# Patient Record
Sex: Female | Born: 1935 | Race: White | Hispanic: No | Smoking: Never smoker
Health system: Southern US, Community
[De-identification: ages and names within clinical notes are randomized; demographics above are authoritative.]

## PROBLEM LIST (undated history)

## (undated) ENCOUNTER — Emergency Department (HOSPITAL_COMMUNITY): Payer: Medicare Other | Source: Home / Self Care

## (undated) DIAGNOSIS — K439 Ventral hernia without obstruction or gangrene: Secondary | ICD-10-CM

## (undated) DIAGNOSIS — J449 Chronic obstructive pulmonary disease, unspecified: Secondary | ICD-10-CM

## (undated) DIAGNOSIS — E119 Type 2 diabetes mellitus without complications: Secondary | ICD-10-CM

## (undated) DIAGNOSIS — Z8051 Family history of malignant neoplasm of kidney: Secondary | ICD-10-CM

## (undated) DIAGNOSIS — T8859XA Other complications of anesthesia, initial encounter: Secondary | ICD-10-CM

## (undated) DIAGNOSIS — I509 Heart failure, unspecified: Secondary | ICD-10-CM

## (undated) DIAGNOSIS — C801 Malignant (primary) neoplasm, unspecified: Secondary | ICD-10-CM

## (undated) DIAGNOSIS — Z8052 Family history of malignant neoplasm of bladder: Secondary | ICD-10-CM

## (undated) DIAGNOSIS — I10 Essential (primary) hypertension: Secondary | ICD-10-CM

## (undated) DIAGNOSIS — Z1509 Genetic susceptibility to other malignant neoplasm: Secondary | ICD-10-CM

## (undated) DIAGNOSIS — T7840XA Allergy, unspecified, initial encounter: Secondary | ICD-10-CM

## (undated) DIAGNOSIS — R112 Nausea with vomiting, unspecified: Secondary | ICD-10-CM

## (undated) DIAGNOSIS — Z87442 Personal history of urinary calculi: Secondary | ICD-10-CM

## (undated) DIAGNOSIS — E079 Disorder of thyroid, unspecified: Secondary | ICD-10-CM

## (undated) DIAGNOSIS — T4145XA Adverse effect of unspecified anesthetic, initial encounter: Secondary | ICD-10-CM

## (undated) DIAGNOSIS — M1990 Unspecified osteoarthritis, unspecified site: Secondary | ICD-10-CM

## (undated) DIAGNOSIS — K5730 Diverticulosis of large intestine without perforation or abscess without bleeding: Secondary | ICD-10-CM

## (undated) DIAGNOSIS — Z8719 Personal history of other diseases of the digestive system: Secondary | ICD-10-CM

## (undated) DIAGNOSIS — K219 Gastro-esophageal reflux disease without esophagitis: Secondary | ICD-10-CM

## (undated) DIAGNOSIS — F0280 Dementia in other diseases classified elsewhere without behavioral disturbance: Secondary | ICD-10-CM

## (undated) DIAGNOSIS — I499 Cardiac arrhythmia, unspecified: Secondary | ICD-10-CM

## (undated) DIAGNOSIS — K760 Fatty (change of) liver, not elsewhere classified: Secondary | ICD-10-CM

## (undated) DIAGNOSIS — I4891 Unspecified atrial fibrillation: Secondary | ICD-10-CM

## (undated) DIAGNOSIS — I517 Cardiomegaly: Secondary | ICD-10-CM

## (undated) DIAGNOSIS — E039 Hypothyroidism, unspecified: Secondary | ICD-10-CM

## (undated) DIAGNOSIS — J40 Bronchitis, not specified as acute or chronic: Secondary | ICD-10-CM

## (undated) DIAGNOSIS — R0600 Dyspnea, unspecified: Secondary | ICD-10-CM

## (undated) DIAGNOSIS — N390 Urinary tract infection, site not specified: Secondary | ICD-10-CM

## (undated) DIAGNOSIS — E785 Hyperlipidemia, unspecified: Secondary | ICD-10-CM

## (undated) DIAGNOSIS — Z800 Family history of malignant neoplasm of digestive organs: Secondary | ICD-10-CM

## (undated) DIAGNOSIS — Z9889 Other specified postprocedural states: Secondary | ICD-10-CM

## (undated) HISTORY — PX: APPENDECTOMY: SHX54

## (undated) HISTORY — DX: Essential (primary) hypertension: I10

## (undated) HISTORY — PX: UPPER GI ENDOSCOPY: SHX6162

## (undated) HISTORY — PX: ABDOMINAL HYSTERECTOMY: SHX81

## (undated) HISTORY — DX: Family history of malignant neoplasm of bladder: Z80.52

## (undated) HISTORY — DX: Family history of malignant neoplasm of kidney: Z80.51

## (undated) HISTORY — DX: Family history of malignant neoplasm of digestive organs: Z80.0

## (undated) HISTORY — PX: KNEE SURGERY: SHX244

## (undated) HISTORY — DX: Disorder of thyroid, unspecified: E07.9

## (undated) HISTORY — PX: OTHER SURGICAL HISTORY: SHX169

## (undated) HISTORY — PX: CHOLECYSTECTOMY: SHX55

## (undated) HISTORY — DX: Allergy, unspecified, initial encounter: T78.40XA

## (undated) HISTORY — DX: Hyperlipidemia, unspecified: E78.5

---

## 1998-01-15 ENCOUNTER — Emergency Department (HOSPITAL_COMMUNITY): Admission: EM | Admit: 1998-01-15 | Discharge: 1998-01-15 | Payer: Self-pay | Admitting: Emergency Medicine

## 1999-08-31 ENCOUNTER — Encounter: Payer: Self-pay | Admitting: Emergency Medicine

## 1999-08-31 ENCOUNTER — Inpatient Hospital Stay (HOSPITAL_COMMUNITY): Admission: EM | Admit: 1999-08-31 | Discharge: 1999-09-04 | Payer: Self-pay | Admitting: Emergency Medicine

## 1999-09-01 ENCOUNTER — Encounter: Payer: Self-pay | Admitting: Internal Medicine

## 2000-06-18 ENCOUNTER — Encounter: Payer: Self-pay | Admitting: Internal Medicine

## 2000-06-18 ENCOUNTER — Encounter: Admission: RE | Admit: 2000-06-18 | Discharge: 2000-06-18 | Payer: Self-pay | Admitting: Internal Medicine

## 2001-01-28 ENCOUNTER — Encounter: Payer: Self-pay | Admitting: Emergency Medicine

## 2001-01-28 ENCOUNTER — Emergency Department (HOSPITAL_COMMUNITY): Admission: EM | Admit: 2001-01-28 | Discharge: 2001-01-28 | Payer: Self-pay | Admitting: Emergency Medicine

## 2001-01-29 ENCOUNTER — Emergency Department (HOSPITAL_COMMUNITY): Admission: EM | Admit: 2001-01-29 | Discharge: 2001-01-29 | Payer: Self-pay | Admitting: Emergency Medicine

## 2001-05-26 ENCOUNTER — Emergency Department (HOSPITAL_COMMUNITY): Admission: EM | Admit: 2001-05-26 | Discharge: 2001-05-26 | Payer: Self-pay | Admitting: Emergency Medicine

## 2001-05-26 ENCOUNTER — Encounter: Payer: Self-pay | Admitting: Emergency Medicine

## 2002-02-01 ENCOUNTER — Encounter: Admission: RE | Admit: 2002-02-01 | Discharge: 2002-02-01 | Payer: Self-pay | Admitting: Internal Medicine

## 2002-02-01 ENCOUNTER — Encounter: Payer: Self-pay | Admitting: Internal Medicine

## 2002-02-16 ENCOUNTER — Emergency Department (HOSPITAL_COMMUNITY): Admission: EM | Admit: 2002-02-16 | Discharge: 2002-02-17 | Payer: Self-pay | Admitting: Emergency Medicine

## 2002-02-16 ENCOUNTER — Encounter: Payer: Self-pay | Admitting: Emergency Medicine

## 2002-02-17 ENCOUNTER — Encounter: Payer: Self-pay | Admitting: Emergency Medicine

## 2003-03-24 ENCOUNTER — Encounter: Payer: Self-pay | Admitting: Internal Medicine

## 2003-03-24 ENCOUNTER — Encounter: Admission: RE | Admit: 2003-03-24 | Discharge: 2003-03-24 | Payer: Self-pay | Admitting: Internal Medicine

## 2003-04-13 ENCOUNTER — Encounter: Payer: Self-pay | Admitting: Orthopedic Surgery

## 2003-04-14 ENCOUNTER — Ambulatory Visit (HOSPITAL_COMMUNITY): Admission: RE | Admit: 2003-04-14 | Discharge: 2003-04-14 | Payer: Self-pay | Admitting: Orthopedic Surgery

## 2004-02-26 ENCOUNTER — Encounter: Payer: Self-pay | Admitting: Internal Medicine

## 2004-06-11 ENCOUNTER — Emergency Department (HOSPITAL_COMMUNITY): Admission: EM | Admit: 2004-06-11 | Discharge: 2004-06-11 | Payer: Self-pay | Admitting: Family Medicine

## 2004-06-18 ENCOUNTER — Emergency Department (HOSPITAL_COMMUNITY): Admission: EM | Admit: 2004-06-18 | Discharge: 2004-06-19 | Payer: Self-pay | Admitting: Emergency Medicine

## 2004-07-30 ENCOUNTER — Ambulatory Visit: Payer: Self-pay | Admitting: Internal Medicine

## 2004-10-23 ENCOUNTER — Ambulatory Visit: Payer: Self-pay | Admitting: Internal Medicine

## 2004-10-26 ENCOUNTER — Inpatient Hospital Stay (HOSPITAL_COMMUNITY): Admission: EM | Admit: 2004-10-26 | Discharge: 2004-10-27 | Payer: Self-pay | Admitting: Emergency Medicine

## 2004-10-26 ENCOUNTER — Ambulatory Visit: Payer: Self-pay | Admitting: Internal Medicine

## 2004-10-31 ENCOUNTER — Ambulatory Visit: Payer: Self-pay | Admitting: Internal Medicine

## 2005-01-02 ENCOUNTER — Ambulatory Visit (HOSPITAL_BASED_OUTPATIENT_CLINIC_OR_DEPARTMENT_OTHER): Admission: RE | Admit: 2005-01-02 | Discharge: 2005-01-02 | Payer: Self-pay | Admitting: Orthopedic Surgery

## 2005-01-02 ENCOUNTER — Ambulatory Visit (HOSPITAL_COMMUNITY): Admission: RE | Admit: 2005-01-02 | Discharge: 2005-01-02 | Payer: Self-pay | Admitting: Orthopedic Surgery

## 2005-01-02 ENCOUNTER — Observation Stay (HOSPITAL_COMMUNITY): Admission: AD | Admit: 2005-01-02 | Discharge: 2005-01-03 | Payer: Self-pay | Admitting: Orthopedic Surgery

## 2005-03-18 ENCOUNTER — Ambulatory Visit: Payer: Self-pay | Admitting: Internal Medicine

## 2005-06-06 ENCOUNTER — Emergency Department (HOSPITAL_COMMUNITY): Admission: EM | Admit: 2005-06-06 | Discharge: 2005-06-06 | Payer: Self-pay | Admitting: Emergency Medicine

## 2005-06-16 ENCOUNTER — Ambulatory Visit: Payer: Self-pay | Admitting: Internal Medicine

## 2005-08-11 ENCOUNTER — Emergency Department (HOSPITAL_COMMUNITY): Admission: EM | Admit: 2005-08-11 | Discharge: 2005-08-12 | Payer: Self-pay | Admitting: Emergency Medicine

## 2005-08-31 ENCOUNTER — Emergency Department (HOSPITAL_COMMUNITY): Admission: EM | Admit: 2005-08-31 | Discharge: 2005-08-31 | Payer: Self-pay | Admitting: Emergency Medicine

## 2006-02-04 ENCOUNTER — Emergency Department (HOSPITAL_COMMUNITY): Admission: EM | Admit: 2006-02-04 | Discharge: 2006-02-04 | Payer: Self-pay | Admitting: Emergency Medicine

## 2006-02-05 ENCOUNTER — Inpatient Hospital Stay (HOSPITAL_COMMUNITY): Admission: EM | Admit: 2006-02-05 | Discharge: 2006-02-09 | Payer: Self-pay | Admitting: Internal Medicine

## 2006-02-05 ENCOUNTER — Ambulatory Visit: Payer: Self-pay | Admitting: Internal Medicine

## 2006-02-08 ENCOUNTER — Encounter (INDEPENDENT_AMBULATORY_CARE_PROVIDER_SITE_OTHER): Payer: Self-pay | Admitting: Specialist

## 2006-02-09 ENCOUNTER — Encounter: Payer: Self-pay | Admitting: Internal Medicine

## 2006-02-11 ENCOUNTER — Ambulatory Visit: Payer: Self-pay | Admitting: Gastroenterology

## 2006-02-16 ENCOUNTER — Ambulatory Visit: Payer: Self-pay | Admitting: Internal Medicine

## 2006-05-12 ENCOUNTER — Ambulatory Visit: Payer: Self-pay | Admitting: Internal Medicine

## 2006-10-28 ENCOUNTER — Emergency Department (HOSPITAL_COMMUNITY): Admission: EM | Admit: 2006-10-28 | Discharge: 2006-10-28 | Payer: Self-pay | Admitting: Emergency Medicine

## 2006-11-17 ENCOUNTER — Ambulatory Visit: Payer: Self-pay | Admitting: Internal Medicine

## 2006-12-29 ENCOUNTER — Ambulatory Visit: Payer: Self-pay | Admitting: Internal Medicine

## 2007-03-09 ENCOUNTER — Encounter: Payer: Self-pay | Admitting: Internal Medicine

## 2007-03-09 DIAGNOSIS — E785 Hyperlipidemia, unspecified: Secondary | ICD-10-CM | POA: Insufficient documentation

## 2007-03-09 DIAGNOSIS — K219 Gastro-esophageal reflux disease without esophagitis: Secondary | ICD-10-CM | POA: Insufficient documentation

## 2007-03-09 DIAGNOSIS — J45909 Unspecified asthma, uncomplicated: Secondary | ICD-10-CM

## 2007-03-09 DIAGNOSIS — I10 Essential (primary) hypertension: Secondary | ICD-10-CM

## 2007-03-09 DIAGNOSIS — E039 Hypothyroidism, unspecified: Secondary | ICD-10-CM

## 2007-03-09 DIAGNOSIS — J309 Allergic rhinitis, unspecified: Secondary | ICD-10-CM | POA: Insufficient documentation

## 2007-03-09 DIAGNOSIS — R32 Unspecified urinary incontinence: Secondary | ICD-10-CM | POA: Insufficient documentation

## 2007-03-29 ENCOUNTER — Ambulatory Visit: Payer: Self-pay | Admitting: Internal Medicine

## 2007-07-19 ENCOUNTER — Ambulatory Visit: Payer: Self-pay | Admitting: Internal Medicine

## 2007-08-27 ENCOUNTER — Telehealth: Payer: Self-pay | Admitting: Internal Medicine

## 2007-12-30 ENCOUNTER — Emergency Department (HOSPITAL_COMMUNITY): Admission: EM | Admit: 2007-12-30 | Discharge: 2007-12-31 | Payer: Self-pay | Admitting: Emergency Medicine

## 2008-01-02 ENCOUNTER — Telehealth: Payer: Self-pay | Admitting: Internal Medicine

## 2008-01-28 ENCOUNTER — Ambulatory Visit: Payer: Self-pay | Admitting: Internal Medicine

## 2008-01-28 DIAGNOSIS — E739 Lactose intolerance, unspecified: Secondary | ICD-10-CM

## 2008-01-28 LAB — CONVERTED CEMR LAB
AST: 17 units/L (ref 0–37)
Cholesterol: 138 mg/dL (ref 0–200)
HDL: 31.5 mg/dL — ABNORMAL LOW (ref >39.0)
Hgb A1c MFr Bld: 6.2 % — ABNORMAL HIGH (ref 4.6–6.0)
LDL Cholesterol: 86 mg/dL (ref 0–99)
Total CHOL/HDL Ratio: 4.4
Triglycerides: 102 mg/dL (ref 0–149)
VLDL: 20 mg/dL (ref 0–40)

## 2008-01-31 ENCOUNTER — Telehealth (INDEPENDENT_AMBULATORY_CARE_PROVIDER_SITE_OTHER): Payer: Self-pay | Admitting: *Deleted

## 2008-02-23 ENCOUNTER — Encounter: Payer: Self-pay | Admitting: Internal Medicine

## 2008-06-06 ENCOUNTER — Ambulatory Visit: Payer: Self-pay | Admitting: Internal Medicine

## 2008-06-06 DIAGNOSIS — J111 Influenza due to unidentified influenza virus with other respiratory manifestations: Secondary | ICD-10-CM | POA: Insufficient documentation

## 2008-09-25 ENCOUNTER — Emergency Department (HOSPITAL_COMMUNITY): Admission: EM | Admit: 2008-09-25 | Discharge: 2008-09-25 | Payer: Self-pay | Admitting: Family Medicine

## 2008-11-07 ENCOUNTER — Ambulatory Visit: Payer: Self-pay | Admitting: Internal Medicine

## 2008-11-07 DIAGNOSIS — R109 Unspecified abdominal pain: Secondary | ICD-10-CM | POA: Insufficient documentation

## 2008-11-07 LAB — CONVERTED CEMR LAB: Blood Glucose, Fingerstick: 84

## 2008-11-25 ENCOUNTER — Emergency Department (HOSPITAL_COMMUNITY): Admission: EM | Admit: 2008-11-25 | Discharge: 2008-11-25 | Payer: Self-pay | Admitting: Emergency Medicine

## 2008-12-26 ENCOUNTER — Encounter (INDEPENDENT_AMBULATORY_CARE_PROVIDER_SITE_OTHER): Payer: Self-pay | Admitting: *Deleted

## 2009-02-05 ENCOUNTER — Ambulatory Visit: Payer: Self-pay | Admitting: Gastroenterology

## 2009-02-12 ENCOUNTER — Ambulatory Visit: Payer: Self-pay | Admitting: Internal Medicine

## 2009-02-12 DIAGNOSIS — J069 Acute upper respiratory infection, unspecified: Secondary | ICD-10-CM

## 2009-02-14 ENCOUNTER — Emergency Department (HOSPITAL_COMMUNITY): Admission: EM | Admit: 2009-02-14 | Discharge: 2009-02-14 | Payer: Self-pay | Admitting: Emergency Medicine

## 2009-02-15 ENCOUNTER — Ambulatory Visit: Payer: Self-pay | Admitting: Internal Medicine

## 2009-02-15 ENCOUNTER — Observation Stay (HOSPITAL_COMMUNITY): Admission: EM | Admit: 2009-02-15 | Discharge: 2009-02-18 | Payer: Self-pay | Admitting: Emergency Medicine

## 2009-02-26 ENCOUNTER — Ambulatory Visit: Payer: Self-pay | Admitting: Internal Medicine

## 2009-04-13 ENCOUNTER — Telehealth (INDEPENDENT_AMBULATORY_CARE_PROVIDER_SITE_OTHER): Payer: Self-pay | Admitting: *Deleted

## 2009-05-17 ENCOUNTER — Ambulatory Visit: Payer: Self-pay | Admitting: Internal Medicine

## 2009-06-29 ENCOUNTER — Encounter (INDEPENDENT_AMBULATORY_CARE_PROVIDER_SITE_OTHER): Payer: Self-pay | Admitting: *Deleted

## 2009-07-05 ENCOUNTER — Ambulatory Visit: Payer: Self-pay | Admitting: Gastroenterology

## 2009-07-16 ENCOUNTER — Ambulatory Visit: Payer: Self-pay | Admitting: Gastroenterology

## 2009-07-16 HISTORY — PX: COLONOSCOPY: SHX174

## 2009-10-19 ENCOUNTER — Emergency Department (HOSPITAL_COMMUNITY): Admission: EM | Admit: 2009-10-19 | Discharge: 2009-10-19 | Payer: Self-pay | Admitting: Family Medicine

## 2010-01-13 ENCOUNTER — Emergency Department (HOSPITAL_COMMUNITY): Admission: EM | Admit: 2010-01-13 | Discharge: 2010-01-13 | Payer: Self-pay | Admitting: Family Medicine

## 2010-01-13 ENCOUNTER — Emergency Department (HOSPITAL_COMMUNITY): Admission: EM | Admit: 2010-01-13 | Discharge: 2010-01-13 | Payer: Self-pay | Admitting: Emergency Medicine

## 2010-01-14 ENCOUNTER — Telehealth: Payer: Self-pay | Admitting: Internal Medicine

## 2010-01-16 ENCOUNTER — Encounter (INDEPENDENT_AMBULATORY_CARE_PROVIDER_SITE_OTHER): Payer: Self-pay | Admitting: General Surgery

## 2010-01-16 ENCOUNTER — Ambulatory Visit (HOSPITAL_COMMUNITY): Admission: AD | Admit: 2010-01-16 | Discharge: 2010-01-17 | Payer: Self-pay | Admitting: General Surgery

## 2010-02-28 ENCOUNTER — Ambulatory Visit: Payer: Self-pay | Admitting: Internal Medicine

## 2010-02-28 LAB — CONVERTED CEMR LAB
ALT: 25 units/L (ref 0–35)
AST: 22 units/L (ref 0–37)
Albumin: 4 g/dL (ref 3.5–5.2)
Alkaline Phosphatase: 52 units/L (ref 39–117)
BUN: 16 mg/dL (ref 6–23)
Basophils Absolute: 0 10*3/uL (ref 0.0–0.1)
Basophils Relative: 0.6 % (ref 0.0–3.0)
Bilirubin, Direct: 0.1 mg/dL (ref 0.0–0.3)
CO2: 34 meq/L — ABNORMAL HIGH (ref 19–32)
Calcium: 9.3 mg/dL (ref 8.4–10.5)
Chloride: 102 meq/L (ref 96–112)
Cholesterol: 177 mg/dL (ref 0–200)
Creatinine, Ser: 0.7 mg/dL (ref 0.4–1.2)
Eosinophils Absolute: 0.2 10*3/uL (ref 0.0–0.7)
Eosinophils Relative: 3.3 % (ref 0.0–5.0)
GFR calc non Af Amer: 89.32 mL/min (ref >60)
Glucose, Bld: 87 mg/dL (ref 70–99)
HCT: 37.6 % (ref 36.0–46.0)
Hemoglobin: 13.1 g/dL (ref 12.0–15.0)
Lymphocytes Relative: 33.2 % (ref 12.0–46.0)
Lymphs Abs: 2.2 10*3/uL (ref 0.7–4.0)
MCHC: 34.8 g/dL (ref 30.0–36.0)
MCV: 96.6 fL (ref 78.0–100.0)
Monocytes Absolute: 0.6 10*3/uL (ref 0.1–1.0)
Monocytes Relative: 9.1 % (ref 3.0–12.0)
Neutro Abs: 3.6 10*3/uL (ref 1.4–7.7)
Neutrophils Relative %: 53.8 % (ref 43.0–77.0)
Platelets: 235 10*3/uL (ref 150.0–400.0)
Potassium: 4.5 meq/L (ref 3.5–5.1)
RBC: 3.89 M/uL (ref 3.87–5.11)
RDW: 13.8 % (ref 11.5–14.6)
Sodium: 141 meq/L (ref 135–145)
TSH: 1 microintl units/mL (ref 0.35–5.50)
Total Bilirubin: 0.4 mg/dL (ref 0.3–1.2)
Total Protein: 7.2 g/dL (ref 6.0–8.3)
WBC: 6.8 10*3/uL (ref 4.5–10.5)

## 2010-03-07 ENCOUNTER — Telehealth: Payer: Self-pay

## 2010-05-20 ENCOUNTER — Encounter (INDEPENDENT_AMBULATORY_CARE_PROVIDER_SITE_OTHER): Payer: Self-pay | Admitting: *Deleted

## 2010-09-29 LAB — CONVERTED CEMR LAB
ALT: 26 units/L (ref 0–35)
AST: 22 units/L (ref 0–37)
Albumin: 3.6 g/dL (ref 3.5–5.2)
Alkaline Phosphatase: 49 units/L (ref 39–117)
BUN: 14 mg/dL (ref 6–23)
Basophils Absolute: 0 10*3/uL (ref 0.0–0.1)
Basophils Relative: 0.7 % (ref 0.0–1.0)
Bilirubin, Direct: 0.1 mg/dL (ref 0.0–0.3)
CO2: 35 meq/L — ABNORMAL HIGH (ref 19–32)
Calcium: 9.5 mg/dL (ref 8.4–10.5)
Chloride: 100 meq/L (ref 96–112)
Cholesterol: 252 mg/dL (ref 0–200)
Creatinine, Ser: 0.7 mg/dL (ref 0.4–1.2)
Direct LDL: 188.6 mg/dL
Eosinophils Absolute: 0.1 10*3/uL (ref 0.0–0.6)
Eosinophils Relative: 2 % (ref 0.0–5.0)
GFR calc Af Amer: 105 mL/min
GFR calc non Af Amer: 87 mL/min
Glucose, Bld: 114 mg/dL — ABNORMAL HIGH (ref 70–99)
HCT: 38.2 % (ref 36.0–46.0)
HDL: 39.7 mg/dL (ref >39.0)
Hemoglobin: 13.4 g/dL (ref 12.0–15.0)
Lymphocytes Relative: 29 % (ref 12.0–46.0)
MCHC: 35.2 g/dL (ref 30.0–36.0)
MCV: 93.6 fL (ref 78.0–100.0)
Monocytes Absolute: 0.5 10*3/uL (ref 0.2–0.7)
Monocytes Relative: 7.7 % (ref 3.0–11.0)
Neutro Abs: 4.2 10*3/uL (ref 1.4–7.7)
Neutrophils Relative %: 60.6 % (ref 43.0–77.0)
Platelets: 297 10*3/uL (ref 150–400)
Potassium: 4 meq/L (ref 3.5–5.1)
RBC: 4.08 M/uL (ref 3.87–5.11)
RDW: 12.7 % (ref 11.5–14.6)
Sodium: 141 meq/L (ref 135–145)
TSH: 1.39 microintl units/mL (ref 0.35–5.50)
Total Bilirubin: 0.6 mg/dL (ref 0.3–1.2)
Total CHOL/HDL Ratio: 6.3
Total Protein: 7 g/dL (ref 6.0–8.3)
Triglycerides: 146 mg/dL (ref 0–149)
VLDL: 29 mg/dL (ref 0–40)
WBC: 6.7 10*3/uL (ref 4.5–10.5)

## 2010-10-03 NOTE — Letter (Signed)
Summary: New Patient letter  [facility]  [address] [place], [place] [address]   Phone: [phone]  Fax: [phone]       05/20/2010 MRN: [id number]  [name] Case [address] [place], [place]  [address]  Dear Ms. [facility],  Welcome to the Gastroenterology Division at [facility].    You are scheduled to see Dr.  Sheryn Bison on July 05, 2010 at 9:00am on the 3rd floor at [facility], [address]  We ask that you try to arrive at our office 15 minutes prior to your appointment time to allow for check-in.  We would like you to complete the enclosed self-administered evaluation form prior to your visit and bring it with you on the day of your appointment.  We will review it with you.  Also, please bring a complete list of all your medications or, if you prefer, bring the medication bottles and we will list them.  Please bring your insurance card so that we may make a copy of it.  If your insurance requires a referral to see a specialist, please bring your referral form from your primary care physician.  Co-payments are due at the time of your visit and may be paid by cash, check or credit card.     Your office visit will consist of a consult with your physician (includes a physical exam), any laboratory testing he/she may order, scheduling of any necessary diagnostic testing (e.g. x-ray, ultrasound, CT-scan), and scheduling of a procedure (e.g. Endoscopy, Colonoscopy) if required.  Please allow enough time on your schedule to allow for any/all of these possibilities.    If you cannot keep your appointment, please call [phone] to cancel or reschedule prior to your appointment date.  This allows [place] the opportunity to schedule an appointment for another patient in need of care.  If you do not cancel or reschedule by 5 p.m. the business day prior to your appointment date, you will be charged a $50.00 late cancellation/no-show fee.    Thank  you for choosing [facility] for your medical needs.  We appreciate the opportunity to care for you.  Please visit [place] at our website  to learn more about our practice.                     Sincerely,                                                             The Gastroenterology Division

## 2010-10-03 NOTE — Progress Notes (Signed)
Summary: lab results  Phone Note Call from Patient   Caller: Patient Call For: [name] Savers  MD Reason for Call: Lab or Test Results Summary of Call: wanting lab results Initial call taken by: Duard Brady LPN,  March 07, 1609 3:07 PM  Follow-up for Phone Call        spoke with pt - labs WNL - KIK Follow-up by: Duard Brady LPN,  March 08, 9603 3:11 PM

## 2010-10-03 NOTE — Progress Notes (Signed)
Summary: umbilical hernia  Phone Note Call from Patient   Caller: Patient Call For: [name]  MD Summary of Call: Pt was seen in ER and diagnosed with an umbilical hernia, and sent to surgery.  [facility] cannot see her until Thursday.  Would like Dr. Kirtland Bouchard to see if he can get her in sooner.? [phone] Initial call taken by: Lynann Beaver CMA,  Jan 14, 2010 12:02 PM  Follow-up for Phone Call        please have patient call and ask to be placed on cancellation/early lsit Follow-up by: [name]  MD,  Jan 14, 2010 12:51 PM  Additional Follow-up for Phone Call Additional follow up Details #1::        Pt. notified. Additional Follow-up by: Lynann Beaver CMA,  Jan 14, 2010 12:58 PM

## 2010-10-03 NOTE — Assessment & Plan Note (Signed)
Summary: CONSULT RE: DRY COUGH/CJR   Vital Signs:  Patient profile:   77 year old female Weight:      222 pounds Temp:     98.7 degrees F oral BP sitting:   128 / 78  (right arm) Cuff size:   regular  Vitals Entered By: Duard Brady LPN (February 28, 2010 10:55 AM) CC: c/o dry cough and dry skin (face) Is Patient Diabetic? No   CC:  c/o dry cough and dry skin (face).  History of Present Illness: 77 year old patient who is seen today for follow-up.  She has hypothyroidism.  She is complaining of some dry skin that has been prompted the past several months.  She has a history of intermittent bronchitis and also complains of refractory cough.  Both started approximately in December.  She has treated hypertension and dyslipidemia.  There is a history of asthma as well as allergic rhinitis.  She denies any wheezing  Allergies: 1)  * Tussionex  Past History:  Past Medical History: Reviewed history from 03/29/2007 and no changes required. Allergic rhinitis Asthma Hyperlipidemia Hypertension Hypothyroidism she is required at least two hospital admissions for asthmatic bronchitis.  The last in February of 06 she has a negative heart catheterization in the past in 2001  Past Surgical History: Reviewed history from 03/29/2007 and no changes required. Appendectomy Cholecystectomy Hysterectomy Total knee replacement patient also has had foot surgery lumpectomies for benign breast disease and surgical repair of an umbilical hernia  Physical Exam  General:  Well-developed,well-nourished,in no acute distress; alert,appropriate and cooperative throughout examination Head:  Normocephalic and atraumatic without obvious abnormalities. No apparent alopecia or balding. Mouth:  Oral mucosa and oropharynx without lesions or exudates.  Teeth in good repair. Neck:  No deformities, masses, or tenderness noted. Lungs:  Normal respiratory effort, chest expands symmetrically. Lungs are clear to  auscultation, no crackles or wheezes. Heart:  Normal rate and regular rhythm. S1 and S2 normal without gallop, murmur, click, rub or other extra sounds. Abdomen:  Bowel sounds positive,abdomen soft and non-tender without masses, organomegaly or hernias noted. Msk:  No deformity or scoliosis noted of thoracic or lumbar spine.   Pulses:  R and L carotid,radial,femoral,dorsalis pedis and posterior tibial pulses are full and equal bilaterally   Impression & Recommendations:  Problem # 1:  GLUCOSE INTOLERANCE (ICD-271.3)  Orders: Venipuncture ([address]) TLB-BMP (Basic Metabolic Panel-BMET) (80048-METABOL) TLB-CBC Platelet - w/Differential (85025-CBCD) TLB-Hepatic/Liver Function Pnl (80076-HEPATIC)  Problem # 2:  ASTHMA (ICD-493.90)  will try Qvar 2 puffs daily  Orders: Venipuncture ([address]) TLB-BMP (Basic Metabolic Panel-BMET) (80048-METABOL) TLB-CBC Platelet - w/Differential (85025-CBCD) TLB-Hepatic/Liver Function Pnl (80076-HEPATIC)  Problem # 3:  HYPOTHYROIDISM (ICD-244.9)  Her updated medication list for this problem includes:    Synthroid 100 Mcg Tabs (Levothyroxine sodium) .[name]... 1 once daily    Her updated medication list for this problem includes:    Synthroid 100 Mcg Tabs (Levothyroxine sodium) .[name]... 1 once daily  Orders: Venipuncture ([address]) TLB-BMP (Basic Metabolic Panel-BMET) (80048-METABOL) TLB-CBC Platelet - w/Differential (85025-CBCD) TLB-TSH (Thyroid Stimulating Hormone) (84443-TSH) TLB-Hepatic/Liver Function Pnl (80076-HEPATIC)  Complete Medication List: 1)  Synthroid 100 Mcg Tabs (Levothyroxine sodium) .[name].. 1 once daily 2)  Zantac 150 Mg Tabs (Ranitidine hcl) .[name].. 1 once daily 3)  Simvastatin 40 Mg Tabs (Simvastatin) .[name].. 1 once daily 4)  Lorazepam 0.5 Mg Tabs (Lorazepam) .[name].. 1 two times a day  as needed 5)  Amlodipine Besy-benazepril Hcl 5-20 Mg Caps (Amlodipine besy-benazepril hcl) .... One daily  Other Orders: TLB-Cholesterol, Total  (  82465-CHO)  Patient Instructions: 1)  Please schedule a follow-up appointment in 3 months. 2)  Limit your Sodium (Salt). 3)  It is important that you exercise regularly at least 20 minutes 5 times a week. If you develop chest pain, have severe difficulty breathing, or feel very tired , stop exercising immediately and seek medical attention. 4)  You need to lose weight. Consider a lower calorie diet and regular exercise.  Prescriptions: AMLODIPINE BESY-BENAZEPRIL HCL 5-20 MG CAPS (AMLODIPINE BESY-BENAZEPRIL HCL) one daily  #90 x 4   Entered and Authorized by:   [name]  MD   Signed by:   [name]  MD on 02/28/2010   Method used:   Print then Give to Patient   RxID:   [id number] LORAZEPAM 0.5 MG  TABS (LORAZEPAM) 1 two times a day  as needed  #50 x 2   Entered and Authorized by:   [name]  MD   Signed by:   [name]  MD on 02/28/2010   Method used:   Print then Give to Patient   RxID:   [id number] SIMVASTATIN 40 MG  TABS (SIMVASTATIN) 1 once daily  #90 x 2   Entered and Authorized by:   [name]  MD   Signed by:   [name]  MD on 02/28/2010   Method used:   Print then Give to Patient   RxID:   [id number] SYNTHROID 100 MCG TABS (LEVOTHYROXINE SODIUM) 1 once daily  #90 Tablet x 2   Entered and Authorized by:   [name]  MD   Signed by:   [name]  MD on 02/28/2010   Method used:   Print then Give to Patient   RxID:   [id number]

## 2010-10-28 ENCOUNTER — Telehealth: Payer: Self-pay | Admitting: Internal Medicine

## 2010-10-28 NOTE — Telephone Encounter (Signed)
Pt is on amlodipine besylate-benazepril 5-20mg . Per pt medicare would like for her to tried amlodipine besylate and benazepril separated.[facility] [address] [phone]

## 2010-10-28 NOTE — Telephone Encounter (Signed)
Okay to change the  Single prescription into 2 separate prescriptions

## 2010-10-29 MED ORDER — AMLODIPINE BESYLATE 5 MG PO TABS: 5.0000 mg | ORAL_TABLET | Freq: Every day | ORAL | Status: DC

## 2010-10-29 MED ORDER — BENAZEPRIL HCL 20 MG PO TABS: 20.0000 mg | ORAL_TABLET | Freq: Every day | ORAL | Status: DC

## 2010-10-29 NOTE — Telephone Encounter (Signed)
Change to med list and new order sent to [facility] with daughter and informed KIK

## 2010-11-18 LAB — CBC
HCT: 38.4 % (ref 36.0–46.0)
Hemoglobin: 13 g/dL (ref 12.0–15.0)
MCHC: 33.7 g/dL (ref 30.0–36.0)
MCV: 94.6 fL (ref 78.0–100.0)
Platelets: 219 10*3/uL (ref 150–400)
RBC: 4.06 MIL/uL (ref 3.87–5.11)
RDW: 13.9 % (ref 11.5–15.5)
WBC: 6.9 10*3/uL (ref 4.0–10.5)

## 2010-11-18 LAB — BASIC METABOLIC PANEL
BUN: 11 mg/dL (ref 6–23)
CO2: 32 mEq/L (ref 19–32)
Calcium: 8.9 mg/dL (ref 8.4–10.5)
Chloride: 104 mEq/L (ref 96–112)
Creatinine, Ser: 0.64 mg/dL (ref 0.4–1.2)
GFR calc Af Amer: >60 mL/min (ref >60)
GFR calc non Af Amer: >60 mL/min (ref >60)
Glucose, Bld: 101 mg/dL — ABNORMAL HIGH (ref 70–99)
Potassium: 4 mEq/L (ref 3.5–5.1)
Sodium: 141 mEq/L (ref 135–145)

## 2010-11-18 LAB — DIFFERENTIAL
Basophils Absolute: 0 10*3/uL (ref 0.0–0.1)
Basophils Relative: 0 % (ref 0–1)
Eosinophils Absolute: 0.2 10*3/uL (ref 0.0–0.7)
Eosinophils Relative: 3 % (ref 0–5)
Lymphocytes Relative: 21 % (ref 12–46)
Lymphs Abs: 1.4 10*3/uL (ref 0.7–4.0)
Monocytes Absolute: 0.5 10*3/uL (ref 0.1–1.0)
Monocytes Relative: 8 % (ref 3–12)
Neutro Abs: 4.8 10*3/uL (ref 1.7–7.7)
Neutrophils Relative %: 69 % (ref 43–77)

## 2010-12-09 LAB — POCT CARDIAC MARKERS: Myoglobin, poc: 125 ng/mL (ref 12–200)

## 2010-12-09 LAB — CBC
HCT: 36.5 % (ref 36.0–46.0)
Hemoglobin: 12.4 g/dL (ref 12.0–15.0)
MCV: 94.2 fL (ref 78.0–100.0)
RBC: 3.74 MIL/uL — ABNORMAL LOW (ref 3.87–5.11)
RBC: 3.87 MIL/uL (ref 3.87–5.11)
WBC: 7.6 10*3/uL (ref 4.0–10.5)
WBC: 7.8 10*3/uL (ref 4.0–10.5)

## 2010-12-09 LAB — BRAIN NATRIURETIC PEPTIDE: Pro B Natriuretic peptide (BNP): 56 pg/mL (ref 0.0–100.0)

## 2010-12-09 LAB — COMPREHENSIVE METABOLIC PANEL
ALT: 20 U/L (ref 0–35)
AST: 22 U/L (ref 0–37)
CO2: 30 mEq/L (ref 19–32)
Chloride: 106 mEq/L (ref 96–112)
GFR calc Af Amer: >60 mL/min (ref >60)
GFR calc non Af Amer: >60 mL/min (ref >60)
Sodium: 143 mEq/L (ref 135–145)
Total Bilirubin: 0.3 mg/dL (ref 0.3–1.2)

## 2010-12-09 LAB — POCT I-STAT 3, ART BLOOD GAS (G3+)
TCO2: 33 mmol/L (ref 0–100)
pCO2 arterial: 54.1 mmHg — ABNORMAL HIGH (ref 35.0–45.0)
pH, Arterial: 7.375 (ref 7.350–7.400)

## 2010-12-09 LAB — DIFFERENTIAL
Eosinophils Absolute: 0.2 10*3/uL (ref 0.0–0.7)
Eosinophils Relative: 3 % (ref 0–5)
Lymphs Abs: 2.2 10*3/uL (ref 0.7–4.0)
Monocytes Absolute: 0.6 10*3/uL (ref 0.1–1.0)
Monocytes Relative: 8 % (ref 3–12)
Neutrophils Relative %: 59 % (ref 43–77)

## 2010-12-09 LAB — BASIC METABOLIC PANEL
Calcium: 9.2 mg/dL (ref 8.4–10.5)
GFR calc Af Amer: >60 mL/min (ref >60)
GFR calc non Af Amer: >60 mL/min (ref >60)
Glucose, Bld: 182 mg/dL — ABNORMAL HIGH (ref 70–99)
Sodium: 140 mEq/L (ref 135–145)

## 2010-12-09 LAB — URINALYSIS, ROUTINE W REFLEX MICROSCOPIC
Glucose, UA: NEGATIVE mg/dL
Protein, ur: NEGATIVE mg/dL
Specific Gravity, Urine: 1.015 (ref 1.005–1.030)

## 2010-12-09 LAB — POCT I-STAT, CHEM 8
BUN: 15 mg/dL (ref 6–23)
Hemoglobin: 11.9 g/dL — ABNORMAL LOW (ref 12.0–15.0)
Potassium: 3.6 mEq/L (ref 3.5–5.1)
Sodium: 142 mEq/L (ref 135–145)
TCO2: 31 mmol/L (ref 0–100)

## 2010-12-09 LAB — CULTURE, RESPIRATORY W GRAM STAIN

## 2010-12-09 LAB — POCT RAPID STREP A (OFFICE): Streptococcus, Group A Screen (Direct): NEGATIVE

## 2010-12-12 LAB — DIFFERENTIAL
Basophils Absolute: 0 10*3/uL (ref 0.0–0.1)
Basophils Relative: 1 % (ref 0–1)
Eosinophils Absolute: 0.2 10*3/uL (ref 0.0–0.7)
Eosinophils Relative: 3 % (ref 0–5)
Lymphocytes Relative: 35 % (ref 12–46)
Lymphs Abs: 1.9 K/uL (ref 0.7–4.0)
Monocytes Absolute: 0.4 K/uL (ref 0.1–1.0)
Monocytes Relative: 8 % (ref 3–12)
Neutro Abs: 3 K/uL (ref 1.7–7.7)
Neutrophils Relative %: 54 % (ref 43–77)

## 2010-12-12 LAB — CBC
HCT: 39.9 % (ref 36.0–46.0)
Hemoglobin: 13.5 g/dL (ref 12.0–15.0)
MCHC: 33.8 g/dL (ref 30.0–36.0)
MCV: 93.7 fL (ref 78.0–100.0)
Platelets: 251 10*3/uL (ref 150–400)
RBC: 4.26 MIL/uL (ref 3.87–5.11)
RDW: 14 % (ref 11.5–15.5)
WBC: 5.5 K/uL (ref 4.0–10.5)

## 2010-12-12 LAB — CK TOTAL AND CKMB (NOT AT ARMC)
CK, MB: 3.7 ng/mL (ref 0.3–4.0)
Relative Index: 2.2 (ref 0.0–2.5)
Total CK: 171 U/L (ref 7–177)

## 2010-12-12 LAB — BASIC METABOLIC PANEL
Calcium: 8.8 mg/dL (ref 8.4–10.5)
Creatinine, Ser: 0.69 mg/dL (ref 0.4–1.2)
GFR calc non Af Amer: >60 mL/min (ref >60)
Glucose, Bld: 121 mg/dL — ABNORMAL HIGH (ref 70–99)
Sodium: 137 mEq/L (ref 135–145)

## 2010-12-12 LAB — BASIC METABOLIC PANEL WITH GFR
BUN: 11 mg/dL (ref 6–23)
CO2: 29 meq/L (ref 19–32)
Chloride: 102 meq/L (ref 96–112)
GFR calc Af Amer: >60 mL/min (ref >60)
Potassium: 3.5 meq/L (ref 3.5–5.1)

## 2010-12-12 LAB — TROPONIN I: Troponin I: 0.02 ng/mL (ref 0.00–0.06)

## 2011-01-14 NOTE — H&P (Signed)
NAME:  [name] Case, [name] Case NO.:  [id number]   MEDICAL RECORD NO.:  [id number]          PATIENT TYPE:  OBV   LOCATION:  5128                         FACILITY:  [facility]   PHYSICIAN:  Ramiro Harvest, MD    DATE OF BIRTH:  10/17/1931   DATE OF ADMISSION:  02/15/2009  DATE OF DISCHARGE:                              HISTORY & PHYSICAL   PRIMARY CARE PHYSICIAN:  [name], MD   HISTORY OF PRESENT ILLNESS:  [name] Case is a 77 year old white  female with history of hypertension, hypothyroidism, gastroesophageal  reflux disease presenting to the ED in a 1-week history of worsening  productive cough and sore throat.  The patient stated that she saw her  PCP and was treated with some over-the-counter medications for cough  with some improvement.  The patient presented to the [facility]  with worsening cough and found to be hypoxic per patient with sats of  83% and sent to the ED.  The patient denies any fevers no chills, no  chest pain or shortness of breath, no abdominal pain, no nausea or  vomiting.  No generalized weakness, no diarrhea or constipation.  No  focal neurological deficits.  No dysuria, no other associated symptoms.  The patient was seen in the ED.  BNP obtained was 56.  CBC was within  normal limits.  Group A strep done was negative.  Point of care cardiac  markers were negative.  Urinalysis was bland.  I-stat-8 within normal  limits.  ABG with a pH of 7.37, pCO2 of 54, pO2 of 107, bicarb of 32.  We were called to admit the patient.  The patient denies any trauma, no  long car trips, no recent surgeries.   ALLERGIES:  CODEINE.   PAST MEDICAL HISTORY:  1. Hypertension.  2. Hypothyroidism.  3. Status post cholecystectomy.  4. Status post appendectomy.  5. Dyslipidemia.  6. Gastroesophageal reflux disease.  7. Status post hysterectomy.  8. Status post abdominal hernia repair.  9. Osteoarthritis and lateral subluxation of the right  patella and a      torn medial and lateral menisci of the right knee status post right      knee arthroscopy with partial medial and lateral meniscectomy and      shaving of the medial femoral condyle per Dr. Simonne Come, Jan 02, 2005.  10.History of chronic plantar fasciitis of the right heel status post      release of plantar fascia by Dr. Simonne Come, April 14, 2003.   HOME MEDICATIONS.:  1. Synthroid 100 mcg p.o. daily.  2. Ranitidine 150 mg p.o. daily.  3. Simvastatin 40 mg p.o. daily.  4. Tylenol extra strength as needed.  5. Norvasc 5 mg p.o. daily.  6. Tessalon Perles 200 mg p.o. t.i.d.   SOCIAL HISTORY:  The patient is married.  She is currently retired and  occasionally notarizes Animator.  No tobacco use.  No alcohol use.  No  IV drug use.   FAMILY HISTORY:  Noncontributory.   REVIEW OF SYSTEMS:  As per HPI, otherwise negative.  PHYSICAL EXAMINATION:  VITAL SIGNS:  Temperature 98.7, blood pressure  142/54, pulse of 60, respirations 20%, sating 100% on 2 liters nasal  cannula.  GENERAL:  Patient lying in bed, in no apparent distress.  HEENT:  Normocephalic, atraumatic.  Pupils equal, round and reactive to  light and accommodation.  Extraocular movements intact.  Oropharynx is  clear.  No lesions, no exudates.  NECK:  Supple.  No lymphadenopathy.  RESPIRATORY:  Lungs are clear to auscultation bilaterally.  No wheezes,  no crackles.  No rhonchi.  CARDIOVASCULAR:  Regular rate and rhythm.  No murmurs, rubs or gallops.  ABDOMEN:  Soft, nontender, nondistended.  Positive bowel sounds.  EXTREMITIES:  No clubbing, cyanosis or edema.  NEUROLOGICAL:  The patient is alert and oriented x3.  Cranial nerves II-  XII are grossly intact.  No focal deficits.   ADMISSION LABORATORY DATA:  Group A strep was negative.  CBC white count  7.8, hemoglobin 12.4, hematocrit 36.5, platelet count of 248 and an ANC  of 4.6.  Point of care cardiac markers CK-MB 2.2, troponin I less than   0.05, myoglobin of 125, BNP of 56.  Urinalysis was yellow clear specific  gravity 1.015, pH of 6, glucose negative, bilirubin negative, ketones  negative, blood negative, protein negative, urobilinogen 1.0, nitrite  negative, leukocytes negative.  I-Stat-8 with a sodium of 142, potassium  3.6, chloride 103, glucose 95, BUN 15, creatinine 0.8.  Blood gas pH of  7.37, pCO2 of 54, pO2 of 107, bicarb of 32, O2 of 98%.  A chest x-ray  done showed stable.  No acute cardiopulmonary disease.   ASSESSMENT AND PLAN:  [name] Case is a 77 year old female a  history of hypertension, hyperlipidemia, hypothyroidism and GERD,  presenting to the ED with a one week history of worsening productive  cough and sore throat and found to be hypoxic at the Urgent [facility]  with saturation of 83% on room air.  1. Hypoxia likely secondary to an acute bronchitis.  The patient with      a low pretest probability for pulmonary embolism.  Chest x-ray is      negative for infiltrate.  The patient is afebrile.  BNP is within      normal limits.  ABG is normal.  We will check an EKG and admit the      patient, place on O2.  Start doxycycline 100 mg p.o. b.i.d.,      Tessalon Perles, Claritin and nebs as needed.  Will also check a      sputum Gram stain and culture.  Repeat chest x-ray in the morning      to rule out pneumonia after the patient is hydrated and will      follow.  2. Mild dehydration IV fluids.  3. Hypertension.  Resume home dose Norvasc.  4. Hypothyroidism.  Synthroid.  5. GERD.  Protonix.  6. Hyperlipidemia.  Simvastatin.  7. Prophylaxis.  Protonix for GI prophylaxis.  SCDs for DVT      prophylaxis.   It has been a pleasure taking care of [name] Case.      Ramiro Harvest, MD  Electronically Signed     DT/MEDQ  D:  02/15/2009  T:  02/15/2009  Job:  [id number]   cc:   [name], MD

## 2011-01-14 NOTE — Discharge Summary (Signed)
[name], [name] Case NO.:  [id number]   MEDICAL RECORD NO.:  [id number]          PATIENT TYPE:  OBV   LOCATION:  5128                         FACILITY:  [facility]   PHYSICIAN:  [name] Hair. Artist Pais, DO      DATE OF BIRTH:  10-30-1931   DATE OF ADMISSION:  02/14/2009  DATE OF DISCHARGE:  02/18/2009                               DISCHARGE SUMMARY   DISCHARGE DIAGNOSES:  1. Hypoxemia secondary to acute bronchitis.  2. Hypertension, poorly controlled.  3. Gastroesophageal reflux disease.  4. Hypothyroidism.  5. Hyperlipidemia.  6. Obesity.   DISCHARGE MEDICATIONS:  1. Prednisone 10 mg taper as directed.  2. Levothyroxine 100 mcg once daily.  3. Simvastatin 40 mg once daily.  4. Claritin 10 mg once daily.  5. Amlodipine 10 mg once daily.  6. Diovan 80 mg once daily.  7. ProAir metered-dose inhaler 2 puffs q.4 h. as needed.  8. Protonix 40 mg daily 30 minutes before a.m. meal.  9. Tessalon Perles 200 mg 3 times a day as needed.  10.Doxycycline 100 mg twice a day x7 days.   FOLLOWUP INSTRUCTIONS:  1. She is to call Dr. Vernon Prey office for followup appointment      within 1 week.  2. The patient advised to call her PCP if worsening shortness of      breath.   HOSPITAL COURSE:  The patient is a 77 year old white female with history  of hypertension, hypothyroidism, and GERD who presented to the ED with 1  week of progressive cough, sore throat, and shortness of breath.  The  patient was found to be hypoxic with O2 sats 83% on room air.  The  patient admitted for further workup and treatment.  Initial ER labs  included BNP 56, CBC was unremarkable, and group A strep was negative.  D-dimer was elevated.  There was concern for possible pulmonary  embolism.  CT scan of the chest PE protocol was obtained, which was  negative for pulmonary embolism.  The patient responded well to  doxycycline and Rocephin.  On second day, she was started on Solu-Medrol  IV.  Her  respiratory status improved, and she was sating 93% on room air  at the time of discharge.   The patient normally takes Norvasc 5 mg at home.  Her blood pressure was  higher than normal.  Her systolic blood pressure was between 170's and  180's.  Her Norvasc was increased to 10 mg and Diovan 80 mg was added at  the time of discharge.  The patient to monitor her blood pressure at  home and follow up with her primary care physician within a week.   LABORATORY DATA:  ABG on February 15, 2009, pH is 7.375, PCO2 of 54.1, PO2  of 107, bicarb 31.8.  CBC on February 15, 2009, WBC 7.6, H and H of 12.1 and  35.2, platelet count of 241.  D-dimer 0.55.  Basic metabolic profile,  sodium 140, potassium 3.8, chloride 102, CO2 of 32, glucose 182, BUN 7,  creatinine 0.66.  Cardiac enzymes negative x1.  UA benign.  X-RAY STUDIES:  CT of chest, technically adequate exam showing no  evidence of acute pulmonary embolism.  Fatty replacement of the  pancreas.  Status post cholecystectomy.   CONDITION ON DISCHARGE:  The patient's coughing and shortness of breath  significantly improved.  The patient felt medically stable for  discharge.      [name] Hair. Artist Pais, DO  Electronically Signed     RDY/MEDQ  D:  02/18/2009  T:  02/19/2009  Job:  [id number]   cc:   [name], MD

## 2011-01-17 NOTE — Op Note (Signed)
NAME:  [name] Case, [name] Case NO.:  [id number]   MEDICAL RECORD NO.:  [id number]          PATIENT TYPE:  AMB   LOCATION:  [facility]                         FACILITY:  [facility]   PHYSICIAN:  Marlowe Kays, M.D.  DATE OF BIRTH:  1931/09/14   DATE OF PROCEDURE:  01/02/2005  DATE OF DISCHARGE:                                 OPERATIVE REPORT   PREOPERATIVE DIAGNOSES:  Osteoarthritis and lateral subluxation right  patella.   POSTOPERATIVE DIAGNOSES:  1.  Osteoarthritis.  2.  Lateral subluxation right patella.  3.  Torn medial and lateral menisci right knee.   OPERATION:  1.  Right knee arthroscopy with partial medial and lateral meniscectomy and      shaving of medial femoral condyle.  2.  Open centralization and stabilization right patella with debridement of      osteophytes from femur and patella.   SURGEON:  Marlowe Kays, M.D.   ASSISTANT:  Nurse.   ANESTHESIA:  General.   PATHOLOGY AND JUSTIFICATION FOR PROCEDURE:  She has a very arthritic knee  bilaterally but the left knee is asymptomatic. She was unable to have an MRI  because of claustrophobia or problems. She understands that she may need  knee replacement but because of the significant pain in her right knee and  the fact that her patella was deviated far lateral ward and abutting against  the lateral femoral condyle, it was felt that the above mentioned surgical  procedure might eliminate the need for a knee replacement. The plan was also  to arthroscope her initially since we could not get an MRI.   DESCRIPTION OF PROCEDURE:  Satisfactory general anesthesia, pneumatic  tourniquet, thigh stabilizer, the right knee was prepped with DuraPrep,  draped in a sterile field. The left knee protected with Ace wrap and knee  protector. Superior medial saline inflow. First through an anterolateral  portal, the medial compartment of the knee joint was evaluated. She had some  grade 2/4 wear of the medial femoral  condyle which I smoothed down. She had  a flap type tear to the anterior third of the medial meniscus. There is a  good bit of wear over the posterior third. Both these areas, I was able to  smooth on down to a stable rim with a 3.5 shaver. I then reversed portals.  She had some areas of full thickness wear over the lateral tibial plateau.  The major problem was severe tearing of most of the entire lateral meniscus  which I was able to shave down until smooth with a 3.5 shaver, pre and post  films were taken. I then concluded the arthroscopic portion of the procedure  by removing all fluid possible. We left the pneumatic tourniquet inflated  and removed the sterile drapes and the thigh stabilizer maintaining  sterility in the remainder of the field. We then reprepped the leg from  tourniquet down to the previous drape from the mid calf distalward and  redraped the leg into a sterile field. The scrub nurse remained sterile  while I assisted in the undraping process and I rescrubbed and regowned. I  then made a vertical midline incision down to the patella mechanism. I  performed a lateral release with cutting cautery. I then made a median  parapatellar incision to open the joint and after inspecting the joint  removed a few osteophytes from the medial femoral condyle and some major  ones from the patella which was badly worn. I then split the patellar tendon  vertically and detached the lateral half distally and moved it beneath the  medial half and then anchored it there with a Mitek anchor supplemented with  multiple #1 Vicryl sutures.  I then closed the median parapatellar incision  with pants over vest type fashion bringing the patella into the patellar  groove. This left nice room laterally also so I could put my finger between  the patella and the lateral femoral condyle. I then advanced the vastus  medialis on top of the patellar tendon and patella, all with multiple #1  Vicryl  sutures. I did some gentle flexion of the knee and the patella  tracked normally. The wound was then irrigated with sterile saline, I closed  the subcutaneous tissue with interrupted #1 deep and 2-0 Vicryl  superficially with staples in the skin and in the portals. Betadine Adaptic  dry sterile dressing were applied, tourniquet was released, knee immobilizer  was applied. At the time of this dictation, she was on her way to the  recovery room in satisfactory condition with no known complications.      JA/MEDQ  D:  01/02/2005  T:  01/02/2005  Job:  [id number]

## 2011-01-17 NOTE — Discharge Summary (Signed)
NAMEBRITTNEY, Case NO.:  [id number]   MEDICAL RECORD NO.:  [id number]          PATIENT TYPE:  INP   LOCATION:  0456                         FACILITY:  [facility]   PHYSICIAN:  Wanda Plump, MD [facility]    DATE OF BIRTH:  29-Jul-1932   DATE OF ADMISSION:  10/25/2004  DATE OF DISCHARGE:  10/27/2004                                 DISCHARGE SUMMARY   PRIMARY CARE PHYSICIAN:  [name], M.D. [facility]   ADMISSION DIAGNOSIS:  Bronchitis and bronchospasm.   DISCHARGE DIAGNOSIS:  Bronchitis and bronchospasm.   BRIEF HISTORY AND PHYSICAL:  [name] Case is a 77 year old white female  with history of hypertension and hypothyroidism who was admitted to the  hospital with a 5-day history of wheezing, shortness of breath and some  cough.  She failed to improved with outpatient treatment with Qvar and  Biaxin, and was admitted for further care.   PHYSICAL EXAMINATION:  VITAL SIGNS:  The patient was afebrile in mild  respiratory distress, pulse 68, blood pressure 162/68, respirations 24.  LUNGS:  Show bilateral abundant wheezes and rhonchi.  ABDOMEN:  Negative.   LABORATORY AND X-RAYS:  Creatinine was 0.8, potassium 2.6, blood sugar 192.  Repeated potassium was 3.1 after potassium supplement.  White count 8,  hemoglobin 12.5, platelets 293, calcium 8.7, blood cultures negative at the  time of dictation.  Chest x-ray showed bronchitic changes.   HOSPITAL COURSE:  1.  The patient was admitted to the floor and started on aggressive      treatment with albuterol, Solu-Medrol, Avelox and Mucinex.  Albuterol      was changed to Xopenex for better tolerance.  The patient at the time of      discharge was feeling much better.  2.  We continued with her home medications with Synthroid and HCTZ.  We      noticed the potassium to be very low.  We initially gave her 10 mEq, but      that only raised the potassium to 3.1.  3.  At this time she is ready to go home.   DISCHARGE  INSTRUCTIONS:  We will discharge with the following instructions.  1.  Continue with Synthroid and HCTZ as before.  2.  Use Qvar once a day.  3.  Albuterol 2 puffs every 6 hours until tomorrow and then only as needed      for cough and wheezing.  4.  Avelox for 5 days.  5.  Prednisone 30 mg for 2 days, 20 mg for 2 days and 10 mg for 2 days.  6.  Mucinex-DM twice a day.  7.  Potassium 20 mEq once a day.  8.  Come back to the ER or call the off the symptoms get worse.  9.  See Dr. Amador Cunas next week so he can reassess the need for potassium      and reassess her breathing status.  10. I will recheck this patient's blood sugar before she goes since her      blood sugar yesterday was 191.  Of note this was shortly  after she      received Solu-Medrol IV.      JEP/MEDQ  D:  10/27/2004  T:  10/27/2004  Job:  [id number]   cc:   [name], M.D. [facility]

## 2011-01-17 NOTE — H&P (Signed)
NAMEARLI, BREE NO.:  [id number]   MEDICAL RECORD NO.:  [id number]          PATIENT TYPE:  INP   LOCATION:  0102                         FACILITY:  [facility]   PHYSICIAN:  Lowella Bandy, MD      DATE OF BIRTH:  [birth year]   DATE OF ADMISSION:  02/04/2006  DATE OF DISCHARGE:                                HISTORY & PHYSICAL   PRIMARY CARE PHYSICIAN:  Dr. Amador Cunas.   CHIEF COMPLAINT:  Diarrhea and vomiting.   HISTORY OF PRESENT ILLNESS:  Ms. Melito is a 77 year old female with  hypertension, hypothyroidism who started having diarrhea on Monday, February 02, 2006.  Her symptoms have persisted and actually worsened somewhat to the  point where she has developed vomiting today and is unable to keep anything  down orally.  She denies any melena, hematochezia or hematemesis.  She has  noted low-grade fevers at home to approximately 101.  She denies any other  infectious-type symptoms or any recent antibiotic use.   PAST MEDICAL HISTORY:  1.  Hypertension.  2.  Hypothyroidism.  3.  Status post cholecystectomy.  4.  Status post appendectomy.   MEDICATIONS:  1.  Hydrochlorothiazide 25 mg once a day.  2.  Synthroid 100 mcg p.o. once a day.   ALLERGIES:  CODEINE.   SOCIAL HISTORY:  The patient is a nonsmoker and does not drink alcohol.   FAMILY HISTORY:  Mother died at age 94 from congestive heart failure.  Father died at age 84 from complications of tuberculosis.   REVIEW OF SYSTEMS:  10 systems reviewed and negative other than as noted in  the HPI.   PHYSICAL EXAMINATION:  VITAL SIGNS:  Temperature 101.0, blood pressure  142/72, pulse 88, respirations 16, oxygen saturation 96% on room air.  GENERAL:  The patient is somewhat uncomfortable but in no apparent distress,  breathing easily.  HEENT:  Sclerae anicteric.  Oropharynx clear.  NECK:  Supple.  No masses appreciated.  No carotid bruits or JVD.  CARDIOVASCULAR:  Regular rate and rhythm.  No murmurs,  gallops, or rubs.  CHEST:  Clear to auscultation bilaterally.  ABDOMEN:  Soft, nontender, and nondistended.  Mild epigastric discomfort.  EXTREMITIES:  Warm and no edema.  VASCULAR:  2+ dorsalis pedis and posterior tibial pulses bilaterally.  NEUROLOGICAL:  Cranial nerves grossly intact.  Moving all extremities well.  MUSCULOSKELETAL:  No joint effusions.  SKIN:  No rash.  PSYCHIATRIC:  Alert and oriented x3.  Affect appropriate.   LABORATORY DATA:  Sodium 137, potassium 3.2, chloride 100, bicarb 28, BUN  11, creatinine 0.8, glucose 126.  White blood cell count 13.1, hemoglobin  13.7, hematocrit 40, platelets 254,000.  Urinalysis:  Negative nitrite,  negative leukocyte esterase, negative blood.   ASSESSMENT:  A 77 year old female with gastroenteritis, probably a viral  etiology.  She appears mildly volume depleted and is also hypokalemic.  She  will be admitted for IV fluid volume repletion and potassium repletion.   PLAN:  1.  Continue normal saline with potassium supplementation until she begins      to take p.o.  2.  Hold hydrochlorothiazide until her symptoms improve.  3.  We will add liver function testing and amylase and lipase to her      admission labs  4.  Symptomatic therapy with p.r.n. Phenergan and Zofran.  She has no      apparent risk factors for Clostridium difficile , but given fever and      leukocytosis, will hold off on anti-diarrheal therapy for now.  If her      fever and symptoms persist, she may need blood and stool cultures.  5.  Continue home Synthroid for her hypothyroidism and check TFTs.  6.  If the patient is discharged within 24 hours, no DVT prophylaxis is      necessary.  If her hospitalization persists for longer, she will need to      be initiated on DVT prophylaxis.      Lowella Bandy, MD  Electronically Signed     JJC/MEDQ  D:  02/04/2006  T:  02/05/2006  Job:  [id number]

## 2011-01-17 NOTE — H&P (Signed)
[name], AXTON NO.:  [id number]   MEDICAL RECORD NO.:  [id number]          PATIENT TYPE:  INP   LOCATION:  0456                         [facility]ACILITY[facility]  [facility]   PHYSICIAN:  Wanda Plump, MD [facility]    DATE OF BIRTH:  [birth year]   DATE OF ADMISSION:  10/25/2004  DATE OF DISCHARGE:                                HISTORY & PHYSICAL   PRIMARY CARE PHYSICIAN:  Dr. Eleonore Chiquito of [facility].   CHIEF COMPLAINT:  Shortness of breath.   HISTORY OF PRESENT ILLNESS:  [name] Case is a 77 year old white female  with history of bronchospasms, who came to the ER with a five-day history  with shortness of breath.  She admits to very little cough with occasional  jello sputum.  She was seen by her primary care doctor three days ago, who  prescribed __________, Biaxin but her symptoms did not improve.  At the ER,  she received two nebulizations.  Again, she did not improve.  On exam, she  was very tight in the chest and she was admitted for further care.   PAST MEDICAL HISTORY:  1.  Hypertension.  2.  Hypothyroidism.  3.  Gastroesophageal reflux disease with occasional use of TUMS.  4.  History of wheezing whenever she has Upper respiratory infection.  She      has never been told if she has asthma or not.  5.  She reports remote history of chest pain with a negative cath.  6.  Surgical history includes foot and knee x-ray, hysterectomy,      cholecystectomy, appendectomy and an abdominal hernia repair.   FAMILY HISTORY:  Noncontributory.   SOCIAL HISTORY:  Does not smoke or drink.   REVIEW OF SYSTEMS:  She denies any ear pain, sore throat or runny nose.  No  fever.  No chest pain or lower extremity edema.  No nausea, vomiting or  diarrhea.  The patient had a flu shot last year but she never had a  pneumonia and she requested to get a pneumonia shot today.   MEDICATIONS:  1.  Synthroid 0.1 mg a day.  2.  Hydrochlorothiazide 25 mg, 1 p.o. q.d.   ALLERGIES:   She is allergic to CODEINE and TUSSIONEX.   PHYSICAL EXAMINATION:  VITAL SIGNS:  Temperature 97.3, pulse 68, blood  pressure 162/68, respirations 24.  GENERAL:  The patient is alert, in mild respiratory distress with audible  wheezing.  HEENT:  Tympanic membranes are slightly bulged but not red.  Sinuses are not  tender.  Oropharynx is normal.  LUNGS:  The patient has bilateral severe wheezing with few rhonchi but no  crackles.  CARDIOVASCULAR:  Regular rate and rhythm without a murmur.  ABDOMEN:  Not distended, soft with good bowel sounds.  No organomegaly.  EXTREMITIES:  She has trace peripheral edema.  __________ are symmetric.  NEUROLOGIC:  Motor is intact and the speech is fluent.   LABORATORY AND X-RAYS:  Blood sugar is 141 (non-fasting) and chest x-ray is  consistent with bronchitis.   ASSESSMENT AND PLAN:  [name] Case has been admitted  with symptoms  consistent with bronchitis and bronchospasm.  She is a nonsmoker.  I will  give her a dose of Avelox IV and then switch her to p.o.  Will start IV  steroids, frequent nebulizations.  Will continue with her routine home  medicines as well.      JEP/MEDQ  D:  10/26/2004  T:  10/26/2004  Job:  [id number]   cc:   [name], M.D. [facility]

## 2011-01-17 NOTE — Op Note (Signed)
   NAME:  [name] Case, [name] Case                        ACCOUNT NO.:  [id number]   MEDICAL RECORD NO.:  [id number]                   PATIENT TYPE:  AMB   LOCATION:  DAY                                  FACILITY:  [facility]   PHYSICIAN:  Marlowe Kays, M.D.               DATE OF BIRTH:  19-Nov-1931   DATE OF PROCEDURE:  04/14/2003  DATE OF DISCHARGE:                                 OPERATIVE REPORT   PREOPERATIVE DIAGNOSIS:  Chronic plantar fasciitis, right heel.   POSTOPERATIVE DIAGNOSIS:  Chronic plantar fasciitis, right heel.   OPERATION/PROCEDURE:  Release of plantar fascia, right heel.   SURGEON:  Marlowe Kays, M.D.   ASSISTANT:  Nurse.   ANESTHESIA:  General.   PATHOLOGY AND INDICATIONS FOR PROCEDURE:  Chronic pain at the origin of the  plantar fascia, unresponsive to nonsurgical treatment.  X-ray shows minimal  spur formation.  It was explained to her that the spur was not the cause for  her symptoms.   DESCRIPTION OF PROCEDURE:  Satisfactory general anesthesia.  Pneumatic  tourniquet.  Right foot and ankle prepped with DuraPrep, draped in a sterile  field.  Made a 2 cm incision along the posterior longitudinal arch of the  instep, overlapping slightly the os calcis.  With blunt dissection, I  isolated the plantar fascia above and below and with Metzenbaum scissors  released the fascia across the entire width of the heel under direct  visualization and also feeling with my little finger to be sure that the  release had been completed.  The wound was then well irrigated with sterile  saline and soft tissues infiltrated with 0.5% plain Marcaine.  Subcutaneous  tissue was closed with interrupted 3-0 Vicryl and skin with interrupted 4-0  nylon mattress sutures.  Betadine, Adaptic and dry sterile dressings were  applied.  Tourniquet was released.  At the time of dictation she was on her  way to the recovery room in satisfactory condition with no complications.                                Marlowe Kays, M.D.    JA/MEDQ  D:  04/14/2003  T:  04/14/2003  Job:  [id number]

## 2011-01-17 NOTE — Consult Note (Signed)
[name], Case NO.:  [id number]   MEDICAL RECORD NO.:  [id number]          PATIENT TYPE:  INP   LOCATION:  5034                         FACILITY:  [facility]   PHYSICIAN:  [name] Hawks. Elnoria Howard, MD    DATE OF BIRTH:  01/16/1932   DATE OF CONSULTATION:  02/07/2006  DATE OF DISCHARGE:                                   CONSULTATION   REASON FOR CONSULTATION:  Diarrhea.   HISTORY OF PRESENT ILLNESS:  This is a 77 year old white female with a past  medical history of hypertension, hypothyroidism, status post cholecystectomy  and status post appendectomy who presents with acute diarrhea. The patient's  diarrhea started on Monday without any preceding type of symptoms and she  denies any foreign travel or infectious contacts.  She had numerous bowel  movements at that time which were watery in nature and they continued to  persist.  She presented to her primary care physician's office and  subsequently she was admitted for treatment of her dehydration and further  evaluation of the diarrhea.  The patient states that she has intermittent  diarrhea over the past 6 months that started acutely; however, these  episodes are brief and resolved spontaneously.  She does report having some  nausea and occasional vomiting, but overall she denies any hematemesis or  melena.  The patient does report having the possibility for hematochezia  during this hospitalization.  She reports having fevers of 101 at home and  within the hospital her T-max is noted to be at 103.  She states having a  recent bronchitis which may be the precipitating cause of her fever.  The  patient also underwent a colonoscopy within the past year by one of the  [place] physicians, and she states that it was a negative examination.   PAST MEDICAL HISTORY AND PAST SURGICAL HISTORY:  As stated above.   MEDICATIONS:  1.  Hydrochlorothiazide 25 mg p.o. daily.  2.  Synthroid 100 mcg p.o. daily.   ALLERGIES:   CODEINE.   SOCIAL HISTORY:  The patient is negative for any tobacco, alcohol, or  illicit drug use.   FAMILY HISTORY:  Significant for congestive heart failure in her mother and  tuberculosis in her father.   REVIEW OF SYSTEMS:  As per the HPI.  Negative for any dizziness, chest pain,  shortness of breath, rashes, chills, joint aches, or new neurologic  manifestations.   PHYSICAL EXAMINATION:  VITAL SIGNS:  Blood pressure is 113/41, heart rate  66, respirations 18, temperature 98.7.  GENERAL:  The patient is in no acute distress, alert and oriented.  HEENT:  Normocephalic, atraumatic.  Extraocular muscles are intact.  Pupils  are equal, round, and reactive to light.  NECK:  Supple.  No lymphadenopathy.  LUNGS:  Clear to auscultation bilaterally.  CARDIOVASCULAR:  Regular rate and rhythm without murmurs, gallops, or rubs.  ABDOMEN:  Obese, soft.  Some mild tenderness with palpation without clear  localization.  Unable to appreciate hepatosplenomegaly.  EXTREMITIES:  No clubbing, cyanosis, or edema.   LABORATORY VALUES:  White blood cell count is 7.2, hemoglobin  11.4, MCV  93.9, platelets at 223.  Sodium 141, potassium 3.8, chloride 107, CO2 of 31,  glucose 90, BUN less than 1, creatinine 0.7.   IMPRESSION:  1.  Diarrhea.  2.  Heme-positive stool.  3.  Fever.  4.  Recent history of bronchitis.   After evaluation of the patient, I do not believe that this is of a viral  gastroenteritis.  Further examination is warranted with a flexible  sigmoidoscopy.  This will be performed versus a colonoscopy as she had that  examination recently and was reportedly negative.  Certainly, if the  flexible sigmoidoscopy is negative in regards to any overt mucosal findings  or biopsies, a colonoscopy can be pursued for further evaluation.  Diagnostic considerations of a patient of her age and the acute onset of the  diarrhea, it can be infectious in nature; however, she is negative C. diff.  x2,  and there is no history of any recent sick contacts.  Additionally,  microscopic colitis can present in this manner; however, it is not  associated with fever or heme positivity.  Celiac disease can result in heme-  positive stool, but, again, there is no evidence of any fever.  She has  responded to use Lomotil at this time and she will continue with this  medication.   The plan is for flexible sigmoidoscopy on 02/07/2006.      [name] Hawks Elnoria Howard, MD  Electronically Signed     PDH/MEDQ  D:  02/07/2006  T:  02/09/2006  Job:  [id number]

## 2011-01-17 NOTE — Discharge Summary (Signed)
[name] Case, [name] Case NO.:  [id number]   MEDICAL RECORD NO.:  [id number]          PATIENT TYPE:  INP   LOCATION:  5034                         FACILITY:  [facility]   PHYSICIAN:  Rosalyn Gess. Norins, M.D. LHCDATE OF BIRTH:  08/26/1932   DATE OF ADMISSION:  02/04/2006  DATE OF DISCHARGE:  02/09/2006                                 DISCHARGE SUMMARY   ADMITTING DIAGNOSIS:  1.  Severe diarrhea.  2.  Hypokalemia.   DISCHARGE DIAGNOSES:  1.  Probable viral colitis.  2.  Other medical problems stable.   CONSULTANTS:  Dr. Arcelia Jew for GI.   PROCEDURES:  1.  Chest x-ray on February 05, 2006 with low lung volumes with right lower lobe      atelectasis.  2.  Sigmoidoscopy performed by Dr. Elnoria Howard which was positive for friable      mucosa, consistent with mild underlying colitis.  No evidence of      pseudomembranous colitis.   HISTORY OF PRESENT ILLNESS:  The patient is a 77 year old woman with a known  history of hypertension and hypothyroid disease who began having diarrhea  Monday, February 02, 2006.  She continued to have persistent diarrhea for several  days, finally presenting to emergency department.  She was felt at that time  to be dehydrated and hypokalemic and was in need of hydration and further  evaluation.   PAST MEDICAL HISTORY/FAMILY HISTORY/SOCIAL HISTORY:  Please see the H&P.   PHYSICAL EXAMINATION:  VITAL SIGNS:  Significant for a temperature of 101 at  admission, blood pressure 142/72.  ABDOMEN:  Soft, nontender, nondistended.   LABORATORY DATA:  Sodium of 137, potassium 3.2, chloride 100, CO2 of 28, BUN  11, creatinine 0.8.  White count was 13,100, hemoglobin was 13.7 grams.   HOSPITAL COURSE:  The patient was started on IV fluids.  Stool was sent for  Clostridium difficile, which was negative x2.  The patient was seen in  consultation by the GI service, who took her to the endoscopy suite for a  flexible sigmoidoscopy with results as noted above.   The patient improved  with decreased diarrhea.  With stooling having slowed or abated with no  significant underlying disease by flexible sigmoidoscopy with a working  diagnosis of acute viral diarrhea with mild colitis, she was subsequently  stable and ready for discharge home.   The patient the patient will continue on all of her home medications as  prior to admission.   DISCHARGE EXAMINATION:  VITAL SIGNS:  Temperature 98.9, blood pressure  126/76, pulse 78, respirations 20.  GENERAL APPEARANCE:  This is an obese Caucasian female lying in bed in no  acute distress.  HEENT:  Grossly normal.  CARDIOVASCULAR:  2+ radial pulse.  She had a quiet precordium with a regular  rate and rhythm.  ABDOMEN:  Distended and obese with positive bowel sounds.  No guarding or  rebound.  No organosplenomegaly.   FINAL LABORATORY:  CBC with a hemoglobin of 11.9 grams.  White count was  10,300 was 66% segs, 24% lymphs, 6% monos, 3% eosinophils.  Clostridium  difficile  was negative.  BMET from February 08, 2006 with a sodium of 142,  potassium 3.7, chloride 109, CO2 of 28, BUN 11, creatinine 0.8, glucose was  97.   DISCHARGE MEDICATIONS:  1.  Hydrochlorothiazide 25 mg daily.  2.  Synthroid 100 mcg daily.  3.  The patient may use over-the-counter Imodium AD on a p.r.n. basis.   CONDITION AT THE TIME OF DISCHARGE:  Stable and improved.           ______________________________  Rosalyn Gess Norins, M.D. [facility]     MEN/MEDQ  D:  02/09/2006  T:  02/09/2006  Job:  [id number]

## 2011-02-21 ENCOUNTER — Encounter: Payer: Self-pay | Admitting: Internal Medicine

## 2011-02-24 ENCOUNTER — Ambulatory Visit (INDEPENDENT_AMBULATORY_CARE_PROVIDER_SITE_OTHER)
Admission: RE | Admit: 2011-02-24 | Discharge: 2011-02-24 | Disposition: A | Discharge status: Home / Self Care | Payer: Medicare Other | Source: Ambulatory Visit | Attending: Internal Medicine | Admitting: Internal Medicine

## 2011-02-24 ENCOUNTER — Encounter: Payer: Self-pay | Admitting: Internal Medicine

## 2011-02-24 ENCOUNTER — Ambulatory Visit (INDEPENDENT_AMBULATORY_CARE_PROVIDER_SITE_OTHER): Payer: Medicare Other | Admitting: Internal Medicine

## 2011-02-24 ENCOUNTER — Other Ambulatory Visit: Payer: Self-pay | Admitting: Internal Medicine

## 2011-02-24 ENCOUNTER — Other Ambulatory Visit: Payer: Self-pay

## 2011-02-24 DIAGNOSIS — E039 Hypothyroidism, unspecified: Secondary | ICD-10-CM

## 2011-02-24 DIAGNOSIS — R7309 Other abnormal glucose: Secondary | ICD-10-CM

## 2011-02-24 DIAGNOSIS — E785 Hyperlipidemia, unspecified: Secondary | ICD-10-CM

## 2011-02-24 DIAGNOSIS — J45909 Unspecified asthma, uncomplicated: Secondary | ICD-10-CM

## 2011-02-24 DIAGNOSIS — K219 Gastro-esophageal reflux disease without esophagitis: Secondary | ICD-10-CM

## 2011-02-24 DIAGNOSIS — R609 Edema, unspecified: Secondary | ICD-10-CM

## 2011-02-24 DIAGNOSIS — R0602 Shortness of breath: Secondary | ICD-10-CM

## 2011-02-24 DIAGNOSIS — I10 Essential (primary) hypertension: Secondary | ICD-10-CM

## 2011-02-24 DIAGNOSIS — E669 Obesity, unspecified: Secondary | ICD-10-CM

## 2011-02-24 DIAGNOSIS — R229 Localized swelling, mass and lump, unspecified: Secondary | ICD-10-CM

## 2011-02-24 DIAGNOSIS — R7302 Impaired glucose tolerance (oral): Secondary | ICD-10-CM

## 2011-02-24 LAB — HEPATIC FUNCTION PANEL
ALT: 25 U/L (ref 0–35)
AST: 23 U/L (ref 0–37)
Albumin: 4.2 g/dL (ref 3.5–5.2)
Alkaline Phosphatase: 51 U/L (ref 39–117)
Bilirubin, Direct: 0.1 mg/dL (ref 0.0–0.3)
Total Bilirubin: 0.6 mg/dL (ref 0.3–1.2)
Total Protein: 7 g/dL (ref 6.0–8.3)

## 2011-02-24 LAB — BASIC METABOLIC PANEL
Calcium: 9 mg/dL (ref 8.4–10.5)
GFR: 107.04 mL/min (ref >60.00)
Potassium: 4.1 mEq/L (ref 3.5–5.1)
Sodium: 139 mEq/L (ref 135–145)

## 2011-02-24 LAB — CBC WITH DIFFERENTIAL/PLATELET
Basophils Absolute: 0 10*3/uL (ref 0.0–0.1)
Basophils Relative: 0.7 % (ref 0.0–3.0)
Eosinophils Absolute: 0.1 10*3/uL (ref 0.0–0.7)
Hemoglobin: 13.2 g/dL (ref 12.0–15.0)
Lymphocytes Relative: 35.9 % (ref 12.0–46.0)
Monocytes Relative: 8.2 % (ref 3.0–12.0)
Neutro Abs: 3.1 10*3/uL (ref 1.4–7.7)
Neutrophils Relative %: 53.9 % (ref 43.0–77.0)
RBC: 4.07 Mil/uL (ref 3.87–5.11)

## 2011-02-24 LAB — HEMOGLOBIN A1C: Hgb A1c MFr Bld: 6.4 % (ref 4.6–6.5)

## 2011-02-24 LAB — BRAIN NATRIURETIC PEPTIDE: Pro B Natriuretic peptide (BNP): 52 pg/mL (ref 0.0–100.0)

## 2011-02-24 MED ORDER — FUROSEMIDE 40 MG PO TABS: 40.0000 mg | ORAL_TABLET | Freq: Two times a day (BID) | ORAL | Status: DC

## 2011-02-24 MED ORDER — LORAZEPAM 0.5 MG PO TABS: 0.5000 mg | ORAL_TABLET | Freq: Two times a day (BID) | ORAL | Status: DC | PRN

## 2011-02-24 NOTE — Patient Instructions (Signed)
Limit your sodium (Salt) intake  Furosemide 40 mg every morning  Return in one week for followup

## 2011-02-24 NOTE — Progress Notes (Signed)
  Subjective:    Patient ID: [name] Case, female    DOB: Sep 21, 1931, 77 y.o.   MRN: [id number]  HPI  77 year old patient who is seen today for followup. He complains of a two-month history of dyspnea on exertion as well as fatigue. For the past week she has had lower extremity edema. She does have a history of asthma exogenous obesity and hypothyroidism. Denies any wheezing. Has noticed a mild productive cough. She has treated hypertension which has been stable. Blood pressure medications includes amlodipine. No diuretic therapy. Denies any cardiac disease or any exertional chest pain    Review of Systems  Constitutional: Negative.   HENT: Negative for hearing loss, congestion, sore throat, rhinorrhea, dental problem, sinus pressure and tinnitus.   Eyes: Negative for pain, discharge and visual disturbance.  Respiratory: Positive for shortness of breath. Negative for cough.   Cardiovascular: Positive for leg swelling. Negative for chest pain and palpitations.  Gastrointestinal: Negative for nausea, vomiting, abdominal pain, diarrhea, constipation, blood in stool and abdominal distention.  Genitourinary: Negative for dysuria, urgency, frequency, hematuria, flank pain, vaginal bleeding, vaginal discharge, difficulty urinating, vaginal pain and pelvic pain.  Musculoskeletal: Negative for joint swelling, arthralgias and gait problem.  Skin: Negative for rash.  Neurological: Negative for dizziness, syncope, speech difficulty, weakness, numbness and headaches.  Hematological: Negative for adenopathy.  Psychiatric/Behavioral: Negative for behavioral problems, dysphoric mood and agitation. The patient is not nervous/anxious.        Objective:   Physical Exam  Constitutional: She is oriented to person, place, and time. She appears well-developed and well-nourished.       Obese 130/80  HENT:  Head: Normocephalic.  Right Ear: External ear normal.  Left Ear: External ear normal.  Mouth/Throat:  Oropharynx is clear and moist.  Eyes: Conjunctivae and EOM are normal. Pupils are equal, round, and reactive to light.  Neck: Normal range of motion. Neck supple. No thyromegaly present.  Cardiovascular: Normal rate, regular rhythm, normal heart sounds and intact distal pulses.   Pulmonary/Chest: Effort normal and breath sounds normal.       Chest clear O2 saturation 94% Pulse rate 68  Abdominal: Soft. Bowel sounds are normal. She exhibits no mass. There is no tenderness.  Musculoskeletal: Normal range of motion. She exhibits edema.       +2 lower extremity edema  Lymphadenopathy:    She has no cervical adenopathy.  Neurological: She is alert and oriented to person, place, and time.  Skin: Skin is warm and dry. No rash noted.  Psychiatric: She has a normal mood and affect. Her behavior is normal.          Assessment & Plan:   Dyspnea on exertion Pedal edema Hypertension Hypothyroidism History of asthma  We'll place on a more rigorous salt restricted diet and add furosemide 40 mg daily. We'll check an EKG and chest x-ray and consider a 2-D echocardiogram we'll check lab including TSH and a BMP. We'll recheck in one week or sooner if she fails to improve We'll hold off on bronchodilators at this time

## 2011-02-24 NOTE — Telephone Encounter (Signed)
Faxed back to [facility] 

## 2011-02-25 LAB — D-DIMER, QUANTITATIVE: D-Dimer, Quant: 0.87 ug/mL-FEU — ABNORMAL HIGH (ref 0.00–0.48)

## 2011-03-04 ENCOUNTER — Ambulatory Visit (INDEPENDENT_AMBULATORY_CARE_PROVIDER_SITE_OTHER): Payer: Medicare Other | Admitting: Internal Medicine

## 2011-03-04 ENCOUNTER — Encounter: Payer: Self-pay | Admitting: Internal Medicine

## 2011-03-04 DIAGNOSIS — I10 Essential (primary) hypertension: Secondary | ICD-10-CM

## 2011-03-04 DIAGNOSIS — J45909 Unspecified asthma, uncomplicated: Secondary | ICD-10-CM

## 2011-03-04 DIAGNOSIS — E669 Obesity, unspecified: Secondary | ICD-10-CM

## 2011-03-04 NOTE — Patient Instructions (Signed)
Limit your sodium (Salt) intake    It is important that you exercise regularly, at least 20 minutes 3 to 4 times per week.  If you develop chest pain or shortness of breath seek  medical attention.  You need to lose weight.  Consider a lower calorie diet and regular exercise.  Return in 3 months for follow-up  

## 2011-03-04 NOTE — Progress Notes (Signed)
  Subjective:    Patient ID: [name] Case, female    DOB: 07-18-1932, 77 y.o.   MRN: [id number]  HPI  77 year old patient who is seen today for followup. She was seen one week ago complaining of worsening dyspnea on exertion and some fatigue. Her blood pressure was mildly elevated and she had peripheral edema. Laboratory studies revealed a chest x-ray revealed very mild cardiomegaly with some suggestion of vascular congestion. A BNP was normal she was placed on furosemide and has done much better. The peripheral edema has resolved and her dyspnea on exertion has also resolved. She does have a history of asthma. She also has exogenous obesity    Review of Systems  Constitutional: Negative.   HENT: Negative for hearing loss, congestion, sore throat, rhinorrhea, dental problem, sinus pressure and tinnitus.   Eyes: Negative for pain, discharge and visual disturbance.  Respiratory: Positive for shortness of breath. Negative for cough.   Cardiovascular: Negative for chest pain, palpitations and leg swelling.  Gastrointestinal: Negative for nausea, vomiting, abdominal pain, diarrhea, constipation, blood in stool and abdominal distention.  Genitourinary: Negative for dysuria, urgency, frequency, hematuria, flank pain, vaginal bleeding, vaginal discharge, difficulty urinating, vaginal pain and pelvic pain.  Musculoskeletal: Negative for joint swelling, arthralgias and gait problem.  Skin: Negative for rash.  Neurological: Negative for dizziness, syncope, speech difficulty, weakness, numbness and headaches.  Hematological: Negative for adenopathy.  Psychiatric/Behavioral: Negative for behavioral problems, dysphoric mood and agitation. The patient is not nervous/anxious.        Objective:   Physical Exam  Constitutional: She is oriented to person, place, and time. She appears well-developed and well-nourished.       Blood pressure 120/80  HENT:  Head: Normocephalic.  Right Ear: External ear  normal.  Left Ear: External ear normal.  Mouth/Throat: Oropharynx is clear and moist.  Eyes: Conjunctivae and EOM are normal. Pupils are equal, round, and reactive to light.  Neck: Normal range of motion. Neck supple. No thyromegaly present.  Cardiovascular: Normal rate, regular rhythm, normal heart sounds and intact distal pulses.   Pulmonary/Chest: Effort normal and breath sounds normal. No respiratory distress. She has no wheezes. She has no rales.       O2 saturation 95%  Abdominal: Soft. Bowel sounds are normal. She exhibits no mass. There is no tenderness.  Musculoskeletal: Normal range of motion. She exhibits no edema.  Lymphadenopathy:    She has no cervical adenopathy.  Neurological: She is alert and oriented to person, place, and time.  Skin: Skin is warm and dry. No rash noted.  Psychiatric: She has a normal mood and affect. Her behavior is normal.          Assessment & Plan:   Hypertension improved Asthma stable Exogenous obesity  We'll continue present regimen diuretic therapy has helped the edema blood pressure and her dyspnea on exertion. We'll recheck in 3 months Elease Hashimoto salt diet exercise weight loss all encouraged

## 2011-03-18 ENCOUNTER — Other Ambulatory Visit: Payer: Self-pay | Admitting: *Deleted

## 2011-03-18 MED ORDER — LEVOTHYROXINE SODIUM 100 MCG PO TABS: 100.0000 ug | ORAL_TABLET | Freq: Every day | ORAL | Status: DC

## 2011-03-29 ENCOUNTER — Other Ambulatory Visit: Payer: Self-pay | Admitting: Internal Medicine

## 2011-04-03 ENCOUNTER — Ambulatory Visit (INDEPENDENT_AMBULATORY_CARE_PROVIDER_SITE_OTHER): Payer: Medicare Other | Admitting: Internal Medicine

## 2011-04-03 ENCOUNTER — Encounter: Payer: Self-pay | Admitting: Internal Medicine

## 2011-04-03 DIAGNOSIS — E669 Obesity, unspecified: Secondary | ICD-10-CM

## 2011-04-03 DIAGNOSIS — M542 Cervicalgia: Secondary | ICD-10-CM

## 2011-04-03 DIAGNOSIS — I10 Essential (primary) hypertension: Secondary | ICD-10-CM

## 2011-04-03 MED ORDER — METHYLPREDNISOLONE ACETATE 80 MG/ML IJ SUSP
80.0000 mg | Freq: Once | INTRAMUSCULAR | Status: AC
  Administered 2011-04-03: 80 mg via INTRAMUSCULAR

## 2011-04-03 MED ORDER — HYDROCODONE-ACETAMINOPHEN 5-500 MG PO TABS: 1.0000 | ORAL_TABLET | Freq: Four times a day (QID) | ORAL | Status: AC | PRN

## 2011-04-03 NOTE — Progress Notes (Signed)
  Subjective:    Patient ID: [name] Case, female    DOB: 1931-09-21, 77 y.o.   MRN: [id number]  HPI  77 year old patient who presents with 2 week history of right neck upper back and shoulder pain. 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 Pain does not seem to be aggravated by movement of the shoulder however. Movement of the head and neck also does not seem to be an aggravating factor. There is no radiation down the right arm denies any paresthesias or weakness involving the right arm she has a history of treated hypertension which has been stable. She has hypothyroidism she has been using anti-inflammatory medications as well as lorazepam with the only modest improvement    Review of Systems  Musculoskeletal: Positive for back pain.       Objective:   Physical Exam  Constitutional: She appears well-developed and well-nourished. No distress.       Overweight. No acute distress. Blood pressure 130/70.  Musculoskeletal:       Range of motion of the right shoulder appear to be intact. This did not aggravate the pain;  likewise range of motion of the neck was fairly full without aggravation of her discomfort. There is no localized pain or tenderness          Assessment & Plan:   Neck and right shoulder pain. Will treat with Depo-Medrol 80 mg IM. We'll continue lorazepam as a muscle relaxer. Continue rest warm compresses. If unimproved in one week will consider radiographic evaluation

## 2011-04-03 NOTE — Patient Instructions (Signed)
Take 400-600 mg of ibuprofen ( Advil, Motrin) with food every 4 to 6 hours as needed for pain relief or control of fever You  may move around, but avoid painful motions and activities.  Apply  Heat  to the sore area for 15 to 20 minutes 3 or 4 times daily for the next two to 3 days. You  may move around, but avoid painful motions and activities.   His lorazepam twice daily  Pain medications as prescribed  Call or return to clinic prn if these symptoms worsen or fail to improve as anticipated.

## 2011-04-05 ENCOUNTER — Emergency Department (HOSPITAL_COMMUNITY): Payer: Medicare Other

## 2011-04-05 ENCOUNTER — Emergency Department (HOSPITAL_COMMUNITY)
Admission: EM | Admit: 2011-04-05 | Discharge: 2011-04-05 | Disposition: A | Discharge status: Home / Self Care | Payer: Medicare Other | Attending: Emergency Medicine | Admitting: Emergency Medicine

## 2011-04-05 DIAGNOSIS — M25519 Pain in unspecified shoulder: Secondary | ICD-10-CM | POA: Insufficient documentation

## 2011-04-05 DIAGNOSIS — I10 Essential (primary) hypertension: Secondary | ICD-10-CM | POA: Insufficient documentation

## 2011-04-05 DIAGNOSIS — K219 Gastro-esophageal reflux disease without esophagitis: Secondary | ICD-10-CM | POA: Insufficient documentation

## 2011-04-05 DIAGNOSIS — M1990 Unspecified osteoarthritis, unspecified site: Secondary | ICD-10-CM | POA: Insufficient documentation

## 2011-04-05 DIAGNOSIS — E039 Hypothyroidism, unspecified: Secondary | ICD-10-CM | POA: Insufficient documentation

## 2011-05-25 ENCOUNTER — Emergency Department (HOSPITAL_COMMUNITY)
Admission: EM | Admit: 2011-05-25 | Discharge: 2011-05-25 | Disposition: A | Discharge status: Home / Self Care | Payer: Medicare Other | Attending: Emergency Medicine | Admitting: Emergency Medicine

## 2011-05-25 ENCOUNTER — Emergency Department (HOSPITAL_COMMUNITY): Payer: Medicare Other

## 2011-05-25 DIAGNOSIS — S0003XA Contusion of scalp, initial encounter: Secondary | ICD-10-CM | POA: Insufficient documentation

## 2011-05-25 DIAGNOSIS — Y9229 Other specified public building as the place of occurrence of the external cause: Secondary | ICD-10-CM | POA: Insufficient documentation

## 2011-05-25 DIAGNOSIS — W1809XA Striking against other object with subsequent fall, initial encounter: Secondary | ICD-10-CM | POA: Insufficient documentation

## 2011-05-25 DIAGNOSIS — E039 Hypothyroidism, unspecified: Secondary | ICD-10-CM | POA: Insufficient documentation

## 2011-05-25 DIAGNOSIS — S0990XA Unspecified injury of head, initial encounter: Secondary | ICD-10-CM | POA: Insufficient documentation

## 2011-05-25 DIAGNOSIS — K219 Gastro-esophageal reflux disease without esophagitis: Secondary | ICD-10-CM | POA: Insufficient documentation

## 2011-05-25 DIAGNOSIS — I10 Essential (primary) hypertension: Secondary | ICD-10-CM | POA: Insufficient documentation

## 2011-05-27 LAB — COMPREHENSIVE METABOLIC PANEL
ALT: 26
Alkaline Phosphatase: 56
CO2: 29
GFR calc non Af Amer: >60
Glucose, Bld: 128 — ABNORMAL HIGH
Potassium: 3.6
Sodium: 138
Total Bilirubin: 0.9

## 2011-05-27 LAB — DIFFERENTIAL
Basophils Relative: 0
Eosinophils Absolute: 0.2
Neutrophils Relative %: 85 — ABNORMAL HIGH

## 2011-05-27 LAB — CBC
Hemoglobin: 14.8
MCHC: 33.2
RBC: 4.68

## 2011-05-30 ENCOUNTER — Emergency Department (HOSPITAL_COMMUNITY)
Admission: EM | Admit: 2011-05-30 | Discharge: 2011-05-30 | Disposition: A | Discharge status: Home / Self Care | Payer: Medicare Other | Attending: Emergency Medicine | Admitting: Emergency Medicine

## 2011-05-30 ENCOUNTER — Emergency Department (HOSPITAL_COMMUNITY): Payer: Medicare Other

## 2011-05-30 ENCOUNTER — Telehealth: Payer: Self-pay | Admitting: *Deleted

## 2011-05-30 DIAGNOSIS — W1809XA Striking against other object with subsequent fall, initial encounter: Secondary | ICD-10-CM | POA: Insufficient documentation

## 2011-05-30 DIAGNOSIS — I10 Essential (primary) hypertension: Secondary | ICD-10-CM | POA: Insufficient documentation

## 2011-05-30 DIAGNOSIS — E039 Hypothyroidism, unspecified: Secondary | ICD-10-CM | POA: Insufficient documentation

## 2011-05-30 DIAGNOSIS — S0003XA Contusion of scalp, initial encounter: Secondary | ICD-10-CM | POA: Insufficient documentation

## 2011-05-30 DIAGNOSIS — E7800 Pure hypercholesterolemia, unspecified: Secondary | ICD-10-CM | POA: Insufficient documentation

## 2011-05-30 DIAGNOSIS — R220 Localized swelling, mass and lump, head: Secondary | ICD-10-CM | POA: Insufficient documentation

## 2011-05-30 NOTE — Telephone Encounter (Signed)
Pt had a fall one week ago and hit head extremely hard.  Went to ER and was told CT was normal.  Today her entire face is turning blue.  Advised to go back to the ER for re-evaluation.  She did have bruising of both eyes, but the color today is not a bruise...Marland KitchenMarland Kitchenentire face looks blue???

## 2011-05-30 NOTE — Telephone Encounter (Signed)
Agree with advice.  Suspect this is still bruising.

## 2011-06-05 ENCOUNTER — Ambulatory Visit: Payer: Medicare Other | Admitting: Internal Medicine

## 2011-06-12 ENCOUNTER — Ambulatory Visit: Payer: Medicare Other | Admitting: Internal Medicine

## 2011-06-26 ENCOUNTER — Ambulatory Visit (INDEPENDENT_AMBULATORY_CARE_PROVIDER_SITE_OTHER): Payer: Medicare Other | Admitting: Internal Medicine

## 2011-06-26 ENCOUNTER — Encounter: Payer: Self-pay | Admitting: Internal Medicine

## 2011-06-26 DIAGNOSIS — E669 Obesity, unspecified: Secondary | ICD-10-CM

## 2011-06-26 DIAGNOSIS — I10 Essential (primary) hypertension: Secondary | ICD-10-CM

## 2011-06-26 MED ORDER — NYSTATIN-TRIAMCINOLONE 100000-0.1 UNIT/GM-% EX OINT: TOPICAL_OINTMENT | Freq: Two times a day (BID) | CUTANEOUS | Status: DC

## 2011-06-26 NOTE — Progress Notes (Signed)
  Subjective:    Patient ID: [name] Case, female    DOB: 12-Jun-1932, 77 y.o.   MRN: [id number]  HPI  77 year old patient who is seen today for followup. She has a history of exogenous obesity impaired glucose tolerance and hypertension. She has intermittent pedal edema. Blood pressure is controlled on furosemide and also amlodipine. She does admit to dietary noncompliance with Dr. Alcus Dad but does try to moderate her salt intake. Her blood pressure has been controlled. She remains on simvastatin 40.    Review of Systems  Constitutional: Negative.   HENT: Negative for hearing loss, congestion, sore throat, rhinorrhea, dental problem, sinus pressure and tinnitus.   Eyes: Negative for pain, discharge and visual disturbance.  Respiratory: Negative for cough and shortness of breath.   Cardiovascular: Positive for leg swelling. Negative for chest pain and palpitations.  Gastrointestinal: Negative for nausea, vomiting, abdominal pain, diarrhea, constipation, blood in stool and abdominal distention.  Genitourinary: Negative for dysuria, urgency, frequency, hematuria, flank pain, vaginal bleeding, vaginal discharge, difficulty urinating, vaginal pain and pelvic pain.  Musculoskeletal: Negative for joint swelling, arthralgias and gait problem.  Skin: Negative for rash.  Neurological: Negative for dizziness, syncope, speech difficulty, weakness, numbness and headaches.  Hematological: Negative for adenopathy.  Psychiatric/Behavioral: Negative for behavioral problems, dysphoric mood and agitation. The patient is not nervous/anxious.        Objective:   Physical Exam  Constitutional: She is oriented to person, place, and time. She appears well-developed and well-nourished.  HENT:  Head: Normocephalic.  Right Ear: External ear normal.  Left Ear: External ear normal.  Mouth/Throat: Oropharynx is clear and moist.  Eyes: Conjunctivae and EOM are normal. Pupils are equal, round, and reactive to light.   Neck: Normal range of motion. Neck supple. No thyromegaly present.  Cardiovascular: Normal rate, regular rhythm, normal heart sounds and intact distal pulses.   Pulmonary/Chest: Effort normal and breath sounds normal.  Abdominal: Soft. Bowel sounds are normal. She exhibits no mass. There is no tenderness.  Musculoskeletal: Normal range of motion. She exhibits edema.  Lymphadenopathy:    She has no cervical adenopathy.  Neurological: She is alert and oriented to person, place, and time.  Skin: Skin is warm and dry. No rash noted.  Psychiatric: She has a normal mood and affect. Her behavior is normal.          Assessment & Plan:    Hypertension. Well controlled   Pedal edema-  were moderate her salt intake. Will also increase furosemide to a twice a day regimen as needed Exogenous obesity.

## 2011-06-26 NOTE — Patient Instructions (Signed)

## 2011-08-09 ENCOUNTER — Other Ambulatory Visit: Payer: Self-pay | Admitting: Internal Medicine

## 2011-08-25 ENCOUNTER — Emergency Department (HOSPITAL_COMMUNITY)
Admission: EM | Admit: 2011-08-25 | Discharge: 2011-08-25 | Disposition: A | Discharge status: Home / Self Care | Payer: Medicare Other | Source: Home / Self Care | Attending: Emergency Medicine | Admitting: Emergency Medicine

## 2011-08-25 ENCOUNTER — Encounter (HOSPITAL_COMMUNITY): Payer: Self-pay | Admitting: *Deleted

## 2011-08-25 ENCOUNTER — Emergency Department (INDEPENDENT_AMBULATORY_CARE_PROVIDER_SITE_OTHER): Payer: Medicare Other

## 2011-08-25 DIAGNOSIS — R05 Cough: Secondary | ICD-10-CM

## 2011-08-25 MED ORDER — PREDNISONE 20 MG PO TABS: 40.0000 mg | ORAL_TABLET | Freq: Every day | ORAL | Status: AC

## 2011-08-25 MED ORDER — ALBUTEROL SULFATE HFA 108 (90 BASE) MCG/ACT IN AERS: 1.0000 | INHALATION_SPRAY | Freq: Four times a day (QID) | RESPIRATORY_TRACT | Status: DC | PRN

## 2011-08-25 NOTE — ED Notes (Signed)
Pt  Has  Symptoms  Of  Cough  sorethroat  Body  Aches       Congested  X  5  Days    Pt  Is  Awake  As  Well as  Alert speaking in  Complete  sentances

## 2011-08-25 NOTE — ED Provider Notes (Signed)
History     CSN: [id number]  Arrival date & time 08/25/11  1157   First MD Initiated Contact with Patient 08/25/11 1351      Chief Complaint  Patient presents with  . Cough    (Consider location/radiation/quality/duration/timing/severity/associated sxs/prior treatment) HPI Comments: Coughing for 4 days ( no congestion, no fevers, no SOB) cant sleep well at night with this cough, been trying over the counter medicines and an old albuterol"  No fevers  Patient is a 77 y.o. female presenting with cough. The history is provided by the patient.  Cough This is a new problem. The current episode started more than 2 days ago. The problem occurs constantly. The problem has not changed since onset.The cough is non-productive. There has been no fever. Pertinent negatives include no ear congestion, no myalgias, no shortness of breath, no wheezing and no eye redness. She has tried decongestants for the symptoms. The treatment provided no relief.    Past Medical History  Diagnosis Date  . Allergy   . Asthma   . Hyperlipidemia   . Hypertension   . Thyroid disease     Past Surgical History  Procedure Date  . Appendectomy   . Cholecystectomy   . Abdominal hysterectomy   . Total knee arthroplasty     Family History  Problem Relation Age of Onset  . Cancer Mother     colon  . Heart disease Mother     History  Substance Use Topics  . Smoking status: Never Smoker   . Smokeless tobacco: Never Used  . Alcohol Use: No    OB History    Grav Para Term Preterm Abortions TAB SAB Ect Mult Living                  Review of Systems  Constitutional: Negative for fever and fatigue.  Eyes: Negative for redness.  Respiratory: Positive for cough. Negative for apnea, chest tightness, shortness of breath and wheezing.   Musculoskeletal: Negative for myalgias.    Allergies  Tussionex pennkinetic er  Home Medications   Current Outpatient Rx  Name Route Sig Dispense Refill  .  AMLODIPINE BESYLATE 5 MG PO TABS  TAKE 1 TABLET BY MOUTH EVERY DAY 90 tablet 3  . BENAZEPRIL HCL 20 MG PO TABS  TAKE 1 TABLET BY MOUTH EVERY DAY 90 tablet 3  . CELEBREX 200 MG PO CAPS Oral Take 200 mg by mouth 2 (two) times daily as needed.     . FUROSEMIDE 40 MG PO TABS Oral Take 1 tablet (40 mg total) by mouth 2 (two) times daily. 90 tablet 2  . LEVOTHYROXINE SODIUM 100 MCG PO TABS Oral Take 1 tablet (100 mcg total) by mouth daily. 90 tablet 3  . LORAZEPAM 0.5 MG PO TABS Oral Take 1 tablet (0.5 mg total) by mouth 2 (two) times daily as needed. 50 tablet 2  . NYSTATIN-TRIAMCINOLONE 100000-0.1 UNIT/GM-% EX OINT Topical Apply topically 2 (two) times daily. 30 g 0  . RANITIDINE HCL 150 MG PO TABS  TAKE 1 TABLET BY MOUTH EVERY DAY 90 tablet 2  . SIMVASTATIN 40 MG PO TABS  TAKE 1 TABLET BY MOUTH EVERY DAY 90 tablet 2    BP 140/57  Pulse 63  Temp(Src) 98.3 F (36.8 C) (Oral)  Resp 16  SpO2 95%  Physical Exam  Nursing note and vitals reviewed. Constitutional: She appears well-developed and well-nourished. No distress.  HENT:  Head: Normocephalic.  Eyes: Conjunctivae are normal.  Neck: Normal  range of motion.  Pulmonary/Chest: Effort normal and breath sounds normal. No respiratory distress. She has no wheezes. She has no rales. She exhibits no tenderness.  Abdominal: Soft.    ED Course  Procedures (including critical care time)  Labs Reviewed - No data to display Dg Chest 2 View  08/25/2011  *RADIOLOGY REPORT*  Clinical Data: Cough, congestion  CHEST - 2 VIEW  Comparison: Chest x-ray of 02/24/2011  Findings: No infiltrate or effusion is seen.  There is mild peribronchial thickening which may indicate bronchitis. Cardiomegaly is stable.  No bony abnormality is seen.  IMPRESSION:   Bronchitis.  No pneumonia.  Stable cardiomegaly.  Original Report Authenticated By: Juline Patch, M.D.     No diagnosis found.    MDM  Cough- reactive. Mild bronchitis afebrile     Jimmie Molly,  MD 08/25/11 [phone]

## 2011-08-26 ENCOUNTER — Encounter (HOSPITAL_COMMUNITY): Payer: Self-pay | Admitting: *Deleted

## 2011-08-26 ENCOUNTER — Emergency Department (HOSPITAL_COMMUNITY)
Admission: EM | Admit: 2011-08-26 | Discharge: 2011-08-27 | Disposition: A | Discharge status: Home / Self Care | Payer: Medicare Other | Attending: Emergency Medicine | Admitting: Emergency Medicine

## 2011-08-26 DIAGNOSIS — E785 Hyperlipidemia, unspecified: Secondary | ICD-10-CM | POA: Insufficient documentation

## 2011-08-26 DIAGNOSIS — I10 Essential (primary) hypertension: Secondary | ICD-10-CM | POA: Insufficient documentation

## 2011-08-26 DIAGNOSIS — H81399 Other peripheral vertigo, unspecified ear: Secondary | ICD-10-CM

## 2011-08-26 DIAGNOSIS — Z79899 Other long term (current) drug therapy: Secondary | ICD-10-CM | POA: Insufficient documentation

## 2011-08-26 NOTE — ED Notes (Addendum)
Pt in c/o dizziness tonight, checked her BP at home and noted it to be 209/89, states she is compliant with her BP medication, also cough x1 week, started on prednisone yesterday for same, also nausea, vomited x1 in triage

## 2011-08-27 ENCOUNTER — Other Ambulatory Visit (HOSPITAL_COMMUNITY): Payer: Self-pay

## 2011-08-27 LAB — CBC
MCH: 31.7 pg (ref 26.0–34.0)
Platelets: 281 10*3/uL (ref 150–400)
RBC: 3.98 MIL/uL (ref 3.87–5.11)
WBC: 10.9 10*3/uL — ABNORMAL HIGH (ref 4.0–10.5)

## 2011-08-27 LAB — DIFFERENTIAL
Basophils Absolute: 0 10*3/uL (ref 0.0–0.1)
Eosinophils Absolute: 0 10*3/uL (ref 0.0–0.7)
Lymphocytes Relative: 13 % (ref 12–46)
Lymphs Abs: 1.5 10*3/uL (ref 0.7–4.0)
Neutrophils Relative %: 80 % — ABNORMAL HIGH (ref 43–77)

## 2011-08-27 LAB — BASIC METABOLIC PANEL
GFR calc non Af Amer: 82 mL/min — ABNORMAL LOW (ref >90)
Glucose, Bld: 207 mg/dL — ABNORMAL HIGH (ref 70–99)
Potassium: 3.4 mEq/L — ABNORMAL LOW (ref 3.5–5.1)
Sodium: 143 mEq/L (ref 135–145)

## 2011-08-27 MED ORDER — ONDANSETRON 8 MG PO TBDP
8.0000 mg | ORAL_TABLET | Freq: Once | ORAL | Status: AC
  Administered 2011-08-27: 8 mg via ORAL
  Filled 2011-08-27: qty 1

## 2011-08-27 MED ORDER — MECLIZINE HCL 25 MG PO TABS: 25.0000 mg | ORAL_TABLET | Freq: Three times a day (TID) | ORAL | Status: DC | PRN

## 2011-08-27 MED ORDER — MECLIZINE HCL 25 MG PO TABS
25.0000 mg | ORAL_TABLET | Freq: Once | ORAL | Status: AC
  Administered 2011-08-27: 25 mg via ORAL
  Filled 2011-08-27: qty 1

## 2011-08-27 MED ORDER — MECLIZINE HCL 25 MG PO TABS: 25.0000 mg | ORAL_TABLET | Freq: Three times a day (TID) | ORAL | Status: AC | PRN

## 2011-08-27 NOTE — ED Notes (Signed)
Pt given discharge instructions and verbalizes understanding  

## 2011-08-27 NOTE — ED Provider Notes (Signed)
History     CSN: [id number]  Arrival date & time 08/26/11  2303   First MD Initiated Contact with Patient 08/27/11 0008      Chief Complaint  Patient presents with  . Hypertension  . Dizziness    (Consider location/radiation/quality/duration/timing/severity/associated sxs/prior treatment) Patient is a 77 y.o. female presenting with hypertension. The history is provided by the patient.  Hypertension  She took her blood pressure tonight because you is feeling dizzy. She's had dizziness since this afternoon to which she describes as an unsteady feeling which is worse when she stands up. She did have some nausea and vomiting, but she does not feel nauseated now. Eyes headache, tinnitus, hearing loss, visual change. She took her blood pressure at home and it was 210/89. She called a nurse hotline who recommended that she come to the emergency department for evaluation. Symptoms are described as moderate. She only checks her blood pressure at home when she is feeling bad. She relates that she was recently started on prednisone for bronchitis.  Past Medical History  Diagnosis Date  . Allergy   . Asthma   . Hyperlipidemia   . Hypertension   . Thyroid disease     Past Surgical History  Procedure Date  . Appendectomy   . Cholecystectomy   . Abdominal hysterectomy   . Total knee arthroplasty     Family History  Problem Relation Age of Onset  . Cancer Mother     colon  . Heart disease Mother     History  Substance Use Topics  . Smoking status: Never Smoker   . Smokeless tobacco: Never Used  . Alcohol Use: No    OB History    Grav Para Term Preterm Abortions TAB SAB Ect Mult Living                  Review of Systems  All other systems reviewed and are negative.    Allergies  Tussionex pennkinetic er  Home Medications   Current Outpatient Rx  Name Route Sig Dispense Refill  . ALBUTEROL SULFATE HFA 108 (90 BASE) MCG/ACT IN AERS Inhalation Inhale 1-2 puffs into  the lungs every 6 (six) hours as needed for wheezing. 1 Inhaler 0  . AMLODIPINE BESYLATE 5 MG PO TABS  TAKE 1 TABLET BY MOUTH EVERY DAY 90 tablet 3  . BENAZEPRIL HCL 20 MG PO TABS  TAKE 1 TABLET BY MOUTH EVERY DAY 90 tablet 3  . CELEBREX 200 MG PO CAPS Oral Take 200 mg by mouth 2 (two) times daily as needed. Joint pain    . FUROSEMIDE 40 MG PO TABS Oral Take 1 tablet (40 mg total) by mouth 2 (two) times daily. 90 tablet 2  . LEVOTHYROXINE SODIUM 100 MCG PO TABS Oral Take 1 tablet (100 mcg total) by mouth daily. 90 tablet 3  . LORAZEPAM 0.5 MG PO TABS Oral Take 0.5 mg by mouth 2 (two) times daily as needed. Anxiety      . PREDNISONE 20 MG PO TABS Oral Take 2 tablets (40 mg total) by mouth daily. 10 tablet 0  . RANITIDINE HCL 150 MG PO TABS  TAKE 1 TABLET BY MOUTH EVERY DAY 90 tablet 2  . SIMVASTATIN 40 MG PO TABS  TAKE 1 TABLET BY MOUTH EVERY DAY 90 tablet 2    BP 158/56  Pulse 68  Temp(Src) 98.3 F (36.8 C) (Oral)  Resp 20  SpO2 98%  Physical Exam  Nursing note and vitals reviewed.  may 37-year-old female who is resting comfortably and in no acute distress. Vital signs show mild systolic hypertension with blood pressure 155/53. Oxygen saturation is 95% which is normal. Head is normocephalic and atraumatic. PERRLA, EOMI without nystagmus. Fundi show no hemorrhage, exudate, or papilledema. TMs are clear. Oropharynx is clear. Dizziness is not reproduced by head movement. Neck is supple without adenopathy, JVD, or bruit. Back is nontender. Lungs are clear without rales, wheezes, rhonchi. Is has regular rate and rhythm without murmur. Abdomen is soft, flat, nontender without masses or hepatosplenomegaly., No cyanosis, full range of motion present. Skin is warm and dry without rash. Neurologic: Mental status is normal, cranial nerves are intact, there no focal motor or sensory deficits. Cerebellar exam shows normal finger to nose test. Romberg is negative. She is able to ambulate with only slight  unsteadiness.  ED Course  Procedures (including critical care time)   Labs Reviewed  CBC  DIFFERENTIAL  BASIC METABOLIC PANEL   Dg Chest 2 View  08/25/2011  *RADIOLOGY REPORT*  Clinical Data: Cough, congestion  CHEST - 2 VIEW  Comparison: Chest x-ray of 02/24/2011  Findings: No infiltrate or effusion is seen.  There is mild peribronchial thickening which may indicate bronchitis. Cardiomegaly is stable.  No bony abnormality is seen.  IMPRESSION:   Bronchitis.  No pneumonia.  Stable cardiomegaly.  Original Report Authenticated By: Juline Patch, M.D.   Results for orders placed during the hospital encounter of 08/26/11  CBC      Component Value Range   WBC 10.9 (*) 4.0 - 10.5 (K/uL)   RBC 3.98  3.87 - 5.11 (MIL/uL)   Hemoglobin 12.6  12.0 - 15.0 (g/dL)   HCT 16.1  09.6 - 04.5 (%)   MCV 96.2  78.0 - 100.0 (fL)   MCH 31.7  26.0 - 34.0 (pg)   MCHC 32.9  30.0 - 36.0 (g/dL)   RDW 40.9  81.1 - 91.4 (%)   Platelets 281  150 - 400 (K/uL)  DIFFERENTIAL      Component Value Range   Neutrophils Relative 80 (*) 43 - 77 (%)   Neutro Abs 8.7 (*) 1.7 - 7.7 (K/uL)   Lymphocytes Relative 13  12 - 46 (%)   Lymphs Abs 1.5  0.7 - 4.0 (K/uL)   Monocytes Relative 7  3 - 12 (%)   Monocytes Absolute 0.7  0.1 - 1.0 (K/uL)   Eosinophils Relative 0  0 - 5 (%)   Eosinophils Absolute 0.0  0.0 - 0.7 (K/uL)   Basophils Relative 0  0 - 1 (%)   Basophils Absolute 0.0  0.0 - 0.1 (K/uL)  BASIC METABOLIC PANEL      Component Value Range   Sodium 143  135 - 145 (mEq/L)   Potassium 3.4 (*) 3.5 - 5.1 (mEq/L)   Chloride 104  96 - 112 (mEq/L)   CO2 32  19 - 32 (mEq/L)   Glucose, Bld 207 (*) 70 - 99 (mg/dL)   BUN 20  6 - 23 (mg/dL)   Creatinine, Ser 7.82  0.50 - 1.10 (mg/dL)   Calcium 9.0  8.4 - 95.6 (mg/dL)   GFR calc non Af Amer 82 (*) >90 (mL/min)   GFR calc Af Amer >90  >90 (mL/min)   Dg Chest 2 View  08/25/2011  *RADIOLOGY REPORT*  Clinical Data: Cough, congestion  CHEST - 2 VIEW  Comparison: Chest  x-ray of 02/24/2011  Findings: No infiltrate or effusion is seen.  There is mild peribronchial  thickening which may indicate bronchitis. Cardiomegaly is stable.  No bony abnormality is seen.  IMPRESSION:   Bronchitis.  No pneumonia.  Stable cardiomegaly.  Original Report Authenticated By: Juline Patch, M.D.   ]   No diagnosis found.  Is given a dose of meclizine with good relief of symptoms. Following this, she was any difficulty whatsoever.  MDM  Episode of dizziness which probably is mild peripheral vertigo.        Dione Booze, MD 08/27/11 [phone]

## 2011-09-25 ENCOUNTER — Ambulatory Visit: Payer: Medicare Other | Admitting: Internal Medicine

## 2011-10-25 ENCOUNTER — Other Ambulatory Visit: Payer: Self-pay | Admitting: Internal Medicine

## 2012-01-30 ENCOUNTER — Other Ambulatory Visit: Payer: Self-pay

## 2012-01-30 MED ORDER — LORAZEPAM 0.5 MG PO TABS: 0.5000 mg | ORAL_TABLET | Freq: Two times a day (BID) | ORAL | Status: DC | PRN

## 2012-01-30 NOTE — Telephone Encounter (Signed)
done

## 2012-01-30 NOTE — Telephone Encounter (Signed)
Patient called stating she needs her Lorazepam refilled. Patient states her husband is dying from Alzheimers and is having a hard time. Please advise?

## 2012-03-10 ENCOUNTER — Other Ambulatory Visit: Payer: Self-pay | Admitting: Internal Medicine

## 2012-04-02 ENCOUNTER — Other Ambulatory Visit: Payer: Self-pay | Admitting: Internal Medicine

## 2012-06-29 ENCOUNTER — Other Ambulatory Visit: Payer: Medicare Other

## 2012-07-01 ENCOUNTER — Other Ambulatory Visit (INDEPENDENT_AMBULATORY_CARE_PROVIDER_SITE_OTHER): Payer: Medicare Other

## 2012-07-01 DIAGNOSIS — Z0000 Encounter for general adult medical examination without abnormal findings: Secondary | ICD-10-CM

## 2012-07-01 DIAGNOSIS — I10 Essential (primary) hypertension: Secondary | ICD-10-CM

## 2012-07-01 LAB — CBC WITH DIFFERENTIAL/PLATELET
Basophils Relative: 0.6 % (ref 0.0–3.0)
Eosinophils Absolute: 0.1 10*3/uL (ref 0.0–0.7)
Eosinophils Relative: 2.3 % (ref 0.0–5.0)
Hemoglobin: 12.9 g/dL (ref 12.0–15.0)
Lymphocytes Relative: 33.5 % (ref 12.0–46.0)
MCHC: 32.8 g/dL (ref 30.0–36.0)
Monocytes Relative: 7.4 % (ref 3.0–12.0)
Neutro Abs: 3.2 10*3/uL (ref 1.4–7.7)
RBC: 4.1 Mil/uL (ref 3.87–5.11)
WBC: 5.7 10*3/uL (ref 4.5–10.5)

## 2012-07-01 LAB — LIPID PANEL
LDL Cholesterol: 74 mg/dL (ref 0–99)
Total CHOL/HDL Ratio: 3
VLDL: 26 mg/dL (ref 0.0–40.0)

## 2012-07-01 LAB — POCT URINALYSIS DIPSTICK
Glucose, UA: NEGATIVE
Nitrite, UA: NEGATIVE
Protein, UA: NEGATIVE
Urobilinogen, UA: 0.2

## 2012-07-01 LAB — BASIC METABOLIC PANEL
BUN: 14 mg/dL (ref 6–23)
CO2: 30 mEq/L (ref 19–32)
Chloride: 103 mEq/L (ref 96–112)
Creatinine, Ser: 0.8 mg/dL (ref 0.4–1.2)
Glucose, Bld: 106 mg/dL — ABNORMAL HIGH (ref 70–99)
Potassium: 3.7 mEq/L (ref 3.5–5.1)

## 2012-07-01 LAB — HEPATIC FUNCTION PANEL
Bilirubin, Direct: 0.1 mg/dL (ref 0.0–0.3)
Total Bilirubin: 0.5 mg/dL (ref 0.3–1.2)

## 2012-07-01 LAB — TSH: TSH: 1.03 u[IU]/mL (ref 0.35–5.50)

## 2012-07-02 ENCOUNTER — Emergency Department (HOSPITAL_COMMUNITY): Payer: Medicare Other

## 2012-07-02 ENCOUNTER — Emergency Department (HOSPITAL_COMMUNITY)
Admission: EM | Admit: 2012-07-02 | Discharge: 2012-07-03 | Disposition: A | Discharge status: Home / Self Care | Payer: Medicare Other | Attending: Emergency Medicine | Admitting: Emergency Medicine

## 2012-07-02 ENCOUNTER — Encounter (HOSPITAL_COMMUNITY): Payer: Self-pay | Admitting: Emergency Medicine

## 2012-07-02 ENCOUNTER — Other Ambulatory Visit: Payer: Self-pay | Admitting: Internal Medicine

## 2012-07-02 DIAGNOSIS — Z79899 Other long term (current) drug therapy: Secondary | ICD-10-CM | POA: Insufficient documentation

## 2012-07-02 DIAGNOSIS — E079 Disorder of thyroid, unspecified: Secondary | ICD-10-CM | POA: Insufficient documentation

## 2012-07-02 DIAGNOSIS — J40 Bronchitis, not specified as acute or chronic: Secondary | ICD-10-CM

## 2012-07-02 DIAGNOSIS — I10 Essential (primary) hypertension: Secondary | ICD-10-CM | POA: Insufficient documentation

## 2012-07-02 DIAGNOSIS — J45901 Unspecified asthma with (acute) exacerbation: Secondary | ICD-10-CM | POA: Insufficient documentation

## 2012-07-02 DIAGNOSIS — E785 Hyperlipidemia, unspecified: Secondary | ICD-10-CM | POA: Insufficient documentation

## 2012-07-02 LAB — POCT I-STAT TROPONIN I: Troponin i, poc: 0.01 ng/mL (ref 0.00–0.08)

## 2012-07-02 MED ORDER — IPRATROPIUM BROMIDE 0.02 % IN SOLN: 0.5000 mg | Freq: Once | RESPIRATORY_TRACT | Status: DC

## 2012-07-02 MED ORDER — ALBUTEROL SULFATE (5 MG/ML) 0.5% IN NEBU: 5.0000 mg | INHALATION_SOLUTION | Freq: Once | RESPIRATORY_TRACT | Status: DC

## 2012-07-02 MED ORDER — LEVALBUTEROL HCL 0.63 MG/3ML IN NEBU
0.6300 mg | INHALATION_SOLUTION | Freq: Once | RESPIRATORY_TRACT | Status: AC
  Administered 2012-07-03: 0.63 mg via RESPIRATORY_TRACT
  Filled 2012-07-02: qty 3

## 2012-07-02 NOTE — ED Notes (Signed)
pt reports SOB onset with occassional wheezing

## 2012-07-02 NOTE — ED Provider Notes (Signed)
History     CSN: [id number]  Arrival date & time 07/02/12  2032   First MD Initiated Contact with Patient 07/02/12 2330      Chief Complaint  Patient presents with  . Shortness of Breath    (Consider location/radiation/quality/duration/timing/severity/associated sxs/prior treatment) HPI [name] with a one-week history of shortness of breath and cough. Symptoms have been moderate. They are persistent. Her cough is productive of white sputum. Symptoms are worse with exertion. She has not been using her albuterol stating "it never occurred to me to use it". She states her symptoms are consistent with past episodes of bronchitis. She denies chest pain, fever or chills. She denies nausea or vomiting.  Past Medical History  Diagnosis Date  . Allergy   . Asthma   . Hyperlipidemia   . Hypertension   . Thyroid disease     Past Surgical History  Procedure Date  . Appendectomy   . Cholecystectomy   . Abdominal hysterectomy   . Total knee arthroplasty     Family History  Problem Relation Age of Onset  . Cancer Mother     colon  . Heart disease Mother     History  Substance Use Topics  . Smoking status: Never Smoker   . Smokeless tobacco: Never Used  . Alcohol Use: No    OB History    Grav Para Term Preterm Abortions TAB SAB Ect Mult Living                  Review of Systems  All other systems reviewed and are negative.    Allergies  Codeine  Home Medications   Current Outpatient Rx  Name Route Sig Dispense Refill  . ACETAMINOPHEN 500 MG PO TABS Oral Take 1,000 mg by mouth every 6 (six) hours as needed. For occasional pain relief    . AMLODIPINE BESYLATE 5 MG PO TABS  TAKE 1 TABLET BY MOUTH EVERY DAY 90 tablet 3  . BENAZEPRIL HCL 20 MG PO TABS  TAKE 1 TABLET BY MOUTH EVERY DAY 90 tablet 3  . FUROSEMIDE 40 MG PO TABS Oral Take 40 mg by mouth daily. May take an additional evening dose if legs are swelling    . LEVOTHYROXINE SODIUM 100 MCG  PO TABS  TAKE 1 TABLET BY MOUTH DAILY. 90 tablet 0    Needs to be seen  . LORAZEPAM 0.5 MG PO TABS Oral Take 1 tablet (0.5 mg total) by mouth 2 (two) times daily as needed. Anxiety 60 tablet 0  . NAPROXEN SODIUM 220 MG PO TABS Oral Take 440 mg by mouth 2 (two) times daily as needed. For occasional knee pain    . RANITIDINE HCL 150 MG PO TABS Oral Take 150 mg by mouth at bedtime as needed. For occasional acid reflux    . SIMVASTATIN 40 MG PO TABS Oral Take 40 mg by mouth every evening.    . ALBUTEROL SULFATE HFA 108 (90 BASE) MCG/ACT IN AERS Inhalation Inhale 1-2 puffs into the lungs every 6 (six) hours as needed for wheezing. 1 Inhaler 0    BP 157/66  Pulse 74  Temp 97.5 F (36.4 C) (Oral)  Resp 20  SpO2 98%  Physical Exam General: Well-developed, well-nourished female in no acute distress; appearance consistent with age of record HENT: normocephalic, atraumatic Eyes: pupils equal round and reactive to light; extraocular muscles intact Neck: supple Heart: regular rate and rhythm Lungs: Wheezing on expiration; frequent cough Abdomen: soft;  nondistended; nontender; bowel sounds present Extremities: No deformity; full range of motion; pulses normal; trace edema of lower legs Neurologic: Awake, alert and oriented; motor function intact in all extremities and symmetric; no facial droop Skin: Warm and dry Psychiatric: Normal mood and affect    ED Course  Procedures (including critical care time)     MDM   Nursing notes and vitals signs, including pulse oximetry, reviewed.  Summary of this visit's results, reviewed by myself:  Labs:  Results for orders placed during the hospital encounter of 07/02/12  POCT I-STAT TROPONIN I      Component Value Range   Troponin i, poc 0.01  0.00 - 0.08 ng/mL   Comment 3             Imaging Studies: Dg Chest 1 View  07/02/2012  *RADIOLOGY REPORT*  Clinical Data: Chest congestion, cough and shortness of breath.  CHEST - 1 VIEW  Comparison:  08/25/2011  Findings: Mild cardiac enlargement with normal pulmonary vascularity.  No focal airspace consolidation.  No blunting of costophrenic angles.  No pneumothorax.  No significant changes since the previous study.  IMPRESSION: Mild cardiac enlargement.  No evidence of active pulmonary disease.   Original Report Authenticated By: Burman Nieves, M.D.    EKG Interpretation:  Date & Time: 07/02/2012 8:49 PM  Rate: 72  Rhythm: normal sinus rhythm  QRS Axis: normal  Intervals: normal  ST/T Wave abnormalities: nonspecific T wave changes  Conduction Disutrbances:none  Narrative Interpretation:   Old EKG Reviewed: No significant changes  12:32 AM Wheezing resolved and patient feels better after Xopenex treatment. Patient states she has an Advair inhaler and home. She was advised to use his daily as prescribed but not use as a rescue inhaler. We will write her for a Xopenex inhaler.         Hanley Seamen, MD 07/03/12 [phone]

## 2012-07-03 MED ORDER — LEVALBUTEROL TARTRATE 45 MCG/ACT IN AERO: 1.0000 | INHALATION_SPRAY | RESPIRATORY_TRACT | Status: DC | PRN

## 2012-07-03 MED ORDER — AZITHROMYCIN 250 MG PO TABS: 250.0000 mg | ORAL_TABLET | Freq: Every day | ORAL | Status: DC

## 2012-07-03 MED ORDER — HYDROCODONE-HOMATROPINE 5-1.5 MG/5ML PO SYRP: 2.5000 mL | ORAL_SOLUTION | ORAL | Status: DC | PRN

## 2012-07-03 NOTE — ED Notes (Signed)
Pt dc to home.  Pt verbalizes understanding to dc instructions.  Pt states will f/u with pcp as needed.

## 2012-07-06 ENCOUNTER — Encounter: Payer: Medicare Other | Admitting: Internal Medicine

## 2012-07-18 ENCOUNTER — Emergency Department (HOSPITAL_COMMUNITY): Payer: Medicare Other

## 2012-07-18 ENCOUNTER — Emergency Department (HOSPITAL_COMMUNITY)
Admission: EM | Admit: 2012-07-18 | Discharge: 2012-07-18 | Disposition: A | Discharge status: Home / Self Care | Payer: Medicare Other | Attending: Emergency Medicine | Admitting: Emergency Medicine

## 2012-07-18 ENCOUNTER — Encounter (HOSPITAL_COMMUNITY): Payer: Self-pay | Admitting: *Deleted

## 2012-07-18 DIAGNOSIS — E785 Hyperlipidemia, unspecified: Secondary | ICD-10-CM | POA: Insufficient documentation

## 2012-07-18 DIAGNOSIS — E079 Disorder of thyroid, unspecified: Secondary | ICD-10-CM | POA: Insufficient documentation

## 2012-07-18 DIAGNOSIS — I10 Essential (primary) hypertension: Secondary | ICD-10-CM | POA: Insufficient documentation

## 2012-07-18 DIAGNOSIS — J45909 Unspecified asthma, uncomplicated: Secondary | ICD-10-CM | POA: Insufficient documentation

## 2012-07-18 DIAGNOSIS — Z79899 Other long term (current) drug therapy: Secondary | ICD-10-CM | POA: Insufficient documentation

## 2012-07-18 DIAGNOSIS — M1711 Unilateral primary osteoarthritis, right knee: Secondary | ICD-10-CM

## 2012-07-18 DIAGNOSIS — M1710 Unilateral primary osteoarthritis, unspecified knee: Secondary | ICD-10-CM | POA: Insufficient documentation

## 2012-07-18 DIAGNOSIS — M461 Sacroiliitis, not elsewhere classified: Secondary | ICD-10-CM

## 2012-07-18 MED ORDER — CELECOXIB 50 MG PO CAPS: 50.0000 mg | ORAL_CAPSULE | Freq: Two times a day (BID) | ORAL | Status: DC

## 2012-07-18 NOTE — ED Provider Notes (Signed)
History     CSN: [id number]  Arrival date & time 07/18/12  7829   First MD Initiated Contact with Patient 07/18/12 [phone]      Chief Complaint  Patient presents with  . Hip Pain    (Consider location/radiation/quality/duration/timing/severity/associated sxs/prior treatment) HPI Comments: Patient presents today with a chief complaint of pain of her right hip.  She reports that she has been having pain for the past 2-3 days, which is gradually worsening.  She reports that last night the pain became so bad that she needed to use a crutch to ambulate.  She denies fall or trauma.  No prior injury to the hip.  When asked where she is having pain she points to the SI joint and also the iliac crest.  She has taken Tylenol for pain, which has helped somewhat.  She is also having some pain of her right knee.  She reports that she was on Celebrex in the past, which helped a lot.  She went off of the Celebrex when the pain improved.  She has had patellar tendon repair surgery of the right knee.  Her Orthopedist is Dr. Leslee Home.  Patient is a 78 y.o. female presenting with hip pain. The history is provided by the patient.  Hip Pain Pertinent negatives include no chills, fever or numbness.    Past Medical History  Diagnosis Date  . Allergy   . Asthma   . Hyperlipidemia   . Hypertension   . Thyroid disease     Past Surgical History  Procedure Date  . Appendectomy   . Cholecystectomy   . Abdominal hysterectomy   . Total knee arthroplasty     Family History  Problem Relation Age of Onset  . Cancer Mother     colon  . Heart disease Mother     History  Substance Use Topics  . Smoking status: Never Smoker   . Smokeless tobacco: Never Used  . Alcohol Use: No    OB History    Grav Para Term Preterm Abortions TAB SAB Ect Mult Living                  Review of Systems  Constitutional: Negative for fever and chills.  Musculoskeletal:       Right hip pain Right knee pain Pain  with ambulation  Skin: Negative for color change.  Neurological: Negative for numbness.  All other systems reviewed and are negative.    Allergies  Codeine  Home Medications   Current Outpatient Rx  Name  Route  Sig  Dispense  Refill  . ACETAMINOPHEN 500 MG PO TABS   Oral   Take 1,000 mg by mouth every 6 (six) hours as needed. For occasional pain relief         . ALBUTEROL SULFATE HFA 108 (90 BASE) MCG/ACT IN AERS   Inhalation   Inhale 1-2 puffs into the lungs every 6 (six) hours as needed for wheezing.   1 Inhaler   0   . AMLODIPINE BESYLATE 5 MG PO TABS      TAKE 1 TABLET BY MOUTH EVERY DAY   90 tablet   3   . AZITHROMYCIN 250 MG PO TABS   Oral   Take 1 tablet (250 mg total) by mouth daily. Take first 2 tablets together, then 1 every day until finished.   6 tablet   0   . BENAZEPRIL HCL 20 MG PO TABS      TAKE 1 TABLET BY  MOUTH EVERY DAY   90 tablet   3   . FUROSEMIDE 40 MG PO TABS   Oral   Take 40 mg by mouth daily. May take an additional evening dose if legs are swelling         . HYDROCODONE-HOMATROPINE 5-1.5 MG/5ML PO SYRP   Oral   Take 2.5-5 mLs by mouth every 4 (four) hours as needed for cough.   120 mL   0   . LEVALBUTEROL TARTRATE 45 MCG/ACT IN AERO   Inhalation   Inhale 1-2 puffs into the lungs every 4 (four) hours as needed for wheezing.   1 Inhaler   12   . LEVOTHYROXINE SODIUM 100 MCG PO TABS      TAKE 1 TABLET BY MOUTH EVERY DAY   60 tablet   0     Needs to be seen for future refills- last 06-2011   . LORAZEPAM 0.5 MG PO TABS   Oral   Take 1 tablet (0.5 mg total) by mouth 2 (two) times daily as needed. Anxiety   60 tablet   0   . NAPROXEN SODIUM 220 MG PO TABS   Oral   Take 440 mg by mouth 2 (two) times daily as needed. For occasional knee pain         . RANITIDINE HCL 150 MG PO TABS   Oral   Take 150 mg by mouth at bedtime as needed. For occasional acid reflux         . SIMVASTATIN 40 MG PO TABS      TAKE 1  TABLET BY MOUTH EVERY DAY   60 tablet   0     Needs to be seen for future refills- last seen 10/ ...   . SIMVASTATIN 40 MG PO TABS   Oral   Take 40 mg by mouth every evening.           BP 139/55  Pulse 58  Temp 98.5 F (36.9 C) (Oral)  Resp 16  SpO2 94%  Physical Exam  Nursing note and vitals reviewed. Constitutional: She appears well-developed and well-nourished. No distress.  HENT:  Head: Normocephalic and atraumatic.  Mouth/Throat: Oropharynx is clear and moist.  Cardiovascular: Normal rate, regular rhythm and normal heart sounds.   Pulses:      Dorsalis pedis pulses are 2+ on the right side, and 2+ on the left side.  Pulmonary/Chest: Effort normal and breath sounds normal.  Musculoskeletal: Normal range of motion.       Right hip: She exhibits normal range of motion, no tenderness, no bony tenderness, no swelling and no deformity.       Right knee: She exhibits normal range of motion, no swelling, no effusion and no deformity. no tenderness found.  Neurological: She is alert. No sensory deficit. Gait normal.  Skin: Skin is warm and dry. She is not diaphoretic.  Psychiatric: She has a normal mood and affect.    ED Course  Procedures (including critical care time)  Labs Reviewed - No data to display Dg Hip Complete Right  07/18/2012  *RADIOLOGY REPORT*  Clinical Data: Hip pain.  RIGHT HIP - COMPLETE 2+ VIEW  Comparison: No priors.  Findings: AP view of the pelvis and AP and lateral views of the right hip demonstrate no acute displaced fracture, subluxation, dislocation, joint or soft tissue abnormality.  Mild bilateral hip joint osteoarthritis is noted.  IMPRESSION: 1.  No acute radiographic abnormality of the bony pelvis or the right hip.  2.  Mild bilateral hip joint osteoarthritis.   Original Report Authenticated By: Trudie Reed, M.D.    Dg Knee Complete 4 Views Right  07/18/2012  *RADIOLOGY REPORT*  Clinical Data: Right knee pain.  RIGHT KNEE - COMPLETE 4+  VIEW  Comparison: No priors.  Findings: Soft tissue anchor in the anterior aspect of the proximal tibia.  No acute displaced fracture, subluxation, dislocation, joint or soft tissue abnormality.  Joint space narrowing, subchondral sclerosis, subchondral cyst formation and osteophyte formation is noted in a tricompartmental distribution, most severe in the lateral and patellofemoral compartments, compatible with moderate - severe osteoarthritis.  IMPRESSION: 1.  No acute radiographic abnormality of the right knee. 2.  Moderate - severe osteoarthritis, as above. 3.  Soft tissue anchor in the anterior aspect of the proximal tibia, likely related to prior patellar tendon repair.   Original Report Authenticated By: Trudie Reed, M.D.      No diagnosis found.    MDM  Patient with negative xray of the hip aside from mild bilateral hip joint OA.  No erythema, swelling, or warmth of the hip.  Full ROM of the hip.  Tenderness to palpation of the right SI joint.  Therefore, feel that there is most likely inflammation of the SI causing her pain.  Xray of the right knee with no acute findings, but does show moderate-severe OA.   No erythema, swelling, or warmth of the knee.  Patient able to ambulate.  Patient discharged home with Rx for Celebrex.        Pascal Lux [place], PA-C 07/18/12 1614

## 2012-07-18 NOTE — ED Provider Notes (Signed)
Patient reports her husband is bedridden and she's been trying to take care of him. She is doing a lot of leaning over the bed and thinks she may have hurt something. She states he only has a few weeks to live. She states she started having some pain in her right hip as she puts her hand over her right posterior pelvis and states that's where she's having pain. She also has some pain in her right knee when she walks. She's had prior knee surgery and had a patellar tendon repair done. She reports her orthopedist was Dr. Leslee Home.  Pt doesn't have pain over the true hip joint, but mainly over the right SIJ. Also has some mild pain in her right knee.   Pt's SIJ was visualized on her pelvis view of her hip xray.   Daughter arrived and states patient is exhausted and wants her evaluated for that, pt states "I am only here for my hip". Daughter then wanted both knees xrayed, but pt only c/o pain in right hip.   Medical screening examination/treatment/procedure(s) were conducted as a shared visit with non-physician practitioner(s) and myself.  I personally evaluated the patient during the encounter  Devoria Albe, MD, Franz Dell, MD 07/18/12 1019

## 2012-07-18 NOTE — ED Provider Notes (Signed)
See prior note   Ward Givens, MD 07/18/12 [phone]

## 2012-07-18 NOTE — ED Notes (Signed)
Pt reports she has had right hip x2-3 days. Woke up during the night with increased right hip pain. Denies fall or trauma. Pain 3/10 when resting. Pt normally ambulates independently, but last night had to use crutches to walk.

## 2012-07-27 ENCOUNTER — Other Ambulatory Visit: Payer: Self-pay | Admitting: Internal Medicine

## 2012-08-03 ENCOUNTER — Encounter: Payer: Medicare Other | Admitting: Internal Medicine

## 2012-08-19 ENCOUNTER — Encounter: Payer: Self-pay | Admitting: Internal Medicine

## 2012-08-19 ENCOUNTER — Ambulatory Visit (INDEPENDENT_AMBULATORY_CARE_PROVIDER_SITE_OTHER): Payer: Medicare Other | Admitting: Internal Medicine

## 2012-08-19 VITALS — BP 140/66 | HR 67 | Temp 97.8°F | Resp 20 | Ht 61.0 in | Wt 230.0 lb

## 2012-08-19 DIAGNOSIS — E785 Hyperlipidemia, unspecified: Secondary | ICD-10-CM

## 2012-08-19 DIAGNOSIS — E669 Obesity, unspecified: Secondary | ICD-10-CM

## 2012-08-19 DIAGNOSIS — J45909 Unspecified asthma, uncomplicated: Secondary | ICD-10-CM

## 2012-08-19 DIAGNOSIS — Z0000 Encounter for general adult medical examination without abnormal findings: Secondary | ICD-10-CM

## 2012-08-19 DIAGNOSIS — E039 Hypothyroidism, unspecified: Secondary | ICD-10-CM

## 2012-08-19 DIAGNOSIS — I10 Essential (primary) hypertension: Secondary | ICD-10-CM

## 2012-08-19 DIAGNOSIS — E739 Lactose intolerance, unspecified: Secondary | ICD-10-CM

## 2012-08-19 MED ORDER — BENAZEPRIL HCL 20 MG PO TABS: 20.0000 mg | ORAL_TABLET | Freq: Every day | ORAL | Status: DC

## 2012-08-19 MED ORDER — CELECOXIB 50 MG PO CAPS: 50.0000 mg | ORAL_CAPSULE | Freq: Two times a day (BID) | ORAL | Status: DC

## 2012-08-19 MED ORDER — AMLODIPINE BESYLATE 5 MG PO TABS: 5.0000 mg | ORAL_TABLET | Freq: Every morning | ORAL | Status: DC

## 2012-08-19 MED ORDER — LEVOTHYROXINE SODIUM 100 MCG PO TABS: 100.0000 ug | ORAL_TABLET | Freq: Every day | ORAL | Status: DC

## 2012-08-19 MED ORDER — SIMVASTATIN 40 MG PO TABS: 40.0000 mg | ORAL_TABLET | Freq: Every evening | ORAL | Status: DC

## 2012-08-19 MED ORDER — ALBUTEROL SULFATE HFA 108 (90 BASE) MCG/ACT IN AERS: 1.0000 | INHALATION_SPRAY | Freq: Four times a day (QID) | RESPIRATORY_TRACT | Status: DC | PRN

## 2012-08-19 MED ORDER — FUROSEMIDE 40 MG PO TABS: 40.0000 mg | ORAL_TABLET | Freq: Every day | ORAL | Status: DC

## 2012-08-19 NOTE — Patient Instructions (Signed)
Limit your sodium (Salt) intake    It is important that you exercise regularly, at least 20 minutes 3 to 4 times per week.  If you develop chest pain or shortness of breath seek  medical attention.  Please check your blood pressure on a regular basis.  If it is consistently greater than 150/90, please make an office appointment.  You need to lose weight.  Consider a lower calorie diet and regular exercise.  Return in 6 months for follow-up  

## 2012-08-19 NOTE — Progress Notes (Signed)
Subjective:    Patient ID: [name] Case, female    DOB: [birth year], 78 y.o.   MRN: [id number]  HPI  78 year old patient who is seen today for a wellness exam. Medical problems include hypothyroidism glucose intolerance dyslipidemia hypertension and exogenous obesity. She has a history also of gastro-esophageal reflux disease and a history of mild asthma. Last colonoscopy 2 years ago.  Past Medical History  Diagnosis Date  . Allergy   . Asthma   . Hyperlipidemia   . Hypertension   . Thyroid disease     History   Social History  . Marital Status: Married    Spouse Name: N/A    Number of Children: N/A  . Years of Education: N/A   Occupational History  . Not on file.   Social History Main Topics  . Smoking status: Never Smoker   . Smokeless tobacco: Never Used  . Alcohol Use: No  . Drug Use: No  . Sexually Active: Not on file   Other Topics Concern  . Not on file   Social History Narrative  . No narrative on file    Past Surgical History  Procedure Date  . Appendectomy   . Cholecystectomy   . Abdominal hysterectomy   . Total knee arthroplasty     Family History  Problem Relation Age of Onset  . Cancer Mother     colon  . Heart disease Mother     Allergies  Allergen Reactions  . Codeine Hives and Rash    In cough syrup preparations    Current Outpatient Prescriptions on File Prior to Visit  Medication Sig Dispense Refill  . acetaminophen (TYLENOL) 500 MG tablet Take 1,000 mg by mouth every 6 (six) hours as needed. For occasional pain relief      . albuterol (PROVENTIL HFA;VENTOLIN HFA) 108 (90 BASE) MCG/ACT inhaler Inhale 1-2 puffs into the lungs every 6 (six) hours as needed for wheezing.  1 Inhaler  0  . amLODipine (NORVASC) 5 MG tablet Take 5 mg by mouth every morning.      . benazepril (LOTENSIN) 20 MG tablet TAKE 1 TABLET BY MOUTH EVERY DAY  90 tablet  3  . celecoxib (CELEBREX) 50 MG capsule Take 1 capsule (50 mg total) by mouth 2 (two) times  daily.  30 capsule  0  . furosemide (LASIX) 40 MG tablet Take 40 mg by mouth daily. May take an additional evening dose if legs are swelling      . levalbuterol (XOPENEX HFA) 45 MCG/ACT inhaler Inhale 1-2 puffs into the lungs every 4 (four) hours as needed for wheezing.  1 Inhaler  12  . levothyroxine (SYNTHROID, LEVOTHROID) 100 MCG tablet TAKE 1 TABLET BY MOUTH EVERY DAY  60 tablet  0  . LORazepam (ATIVAN) 0.5 MG tablet Take 1 tablet (0.5 mg total) by mouth 2 (two) times daily as needed. Anxiety  60 tablet  0  . naproxen sodium (ALEVE) 220 MG tablet Take 440 mg by mouth 2 (two) times daily as needed. For occasional knee pain      . ranitidine (ZANTAC) 150 MG tablet TAKE 1 TABLET BY MOUTH EVERY DAY  90 tablet  2  . simvastatin (ZOCOR) 40 MG tablet Take 40 mg by mouth every evening.      [name] azithromycin (ZITHROMAX) 250 MG tablet Take 1 tablet (250 mg total) by mouth daily. Take first 2 tablets together, then 1 every day until finished.  6 tablet  0  . HYDROcodone-homatropine (HYCODAN) 5-1.5  MG/5ML syrup Take 2.5-5 mLs by mouth every 4 (four) hours as needed for cough.  120 mL  0    BP 140/66  Pulse 67  Temp 97.8 F (36.6 C) (Oral)  Resp 20  Ht 5\' 1"  (1.549 m)  Wt 230 lb (104.327 kg)  BMI 43.46 kg/m2  SpO2 94%   1. Risk factors, based on past  M,S,F history-  cardiovascular risk factors include hypertension and dyslipidemia  2.  Physical activities: Fairly sedentary but no exercise limitations. She does have mild asthma and arthritis especially involving the left knee  3.  Depression/mood: No history of depression or mood disorder  4.  Hearing: No deficits  5.  ADL's: Independent in all aspects of daily living  6.  Fall risk: Low  7.  Home safety: No problems identified  8.  Height weight, and visual acuity; height and weight stable no change in visual acuity 9.  Counseling: Weight loss encouraged 10. Lab orders based on risk factors: Laboratory profile reviewed. Cholesterol well  controlled fasting blood chair stable 11. Referral : Not appropriate at this time  12. Care plan: Continue present regimen weight loss low salt diet more exercise all recommended 13. Cognitive assessment: Alert and oriented normal affect. No cognitive dysfunction.       Review of Systems  Constitutional: Negative for fever, appetite change, fatigue and unexpected weight change.  HENT: Negative for hearing loss, ear pain, nosebleeds, congestion, sore throat, mouth sores, trouble swallowing, neck stiffness, dental problem, voice change, sinus pressure and tinnitus.   Eyes: Negative for photophobia, pain, redness and visual disturbance.  Respiratory: Negative for cough, chest tightness and shortness of breath.   Cardiovascular: Negative for chest pain, palpitations and leg swelling.  Gastrointestinal: Negative for nausea, vomiting, abdominal pain, diarrhea, constipation, blood in stool, abdominal distention and rectal pain.  Genitourinary: Negative for dysuria, urgency, frequency, hematuria, flank pain, vaginal bleeding, vaginal discharge, difficulty urinating, genital sores, vaginal pain, menstrual problem and pelvic pain.  Musculoskeletal: Negative for back pain and arthralgias (left knee pain).  Skin: Negative for rash.  Neurological: Negative for dizziness, syncope, speech difficulty, weakness, light-headedness, numbness and headaches.  Hematological: Negative for adenopathy. Does not bruise/bleed easily.  Psychiatric/Behavioral: Negative for suicidal ideas, behavioral problems, self-injury, dysphoric mood and agitation. The patient is not nervous/anxious.        Objective:   Physical Exam  Constitutional: She is oriented to person, place, and time. She appears well-developed and well-nourished.  HENT:  Head: Normocephalic.  Right Ear: External ear normal.  Left Ear: External ear normal.  Mouth/Throat: Oropharynx is clear and moist.  Eyes: Conjunctivae normal and EOM are normal.  Pupils are equal, round, and reactive to light.  Neck: Normal range of motion. Neck supple. No thyromegaly present.  Cardiovascular: Normal rate, regular rhythm, normal heart sounds and intact distal pulses.   Pulmonary/Chest: Effort normal and breath sounds normal.  Abdominal: Soft. Bowel sounds are normal. She exhibits no mass. There is no tenderness.  Musculoskeletal: Normal range of motion.  Lymphadenopathy:    She has no cervical adenopathy.  Neurological: She is alert and oriented to person, place, and time.  Skin: Skin is warm and dry. No rash noted.  Psychiatric: She has a normal mood and affect. Her behavior is normal.          Assessment & Plan:   Preventive health examination Impaired glucose tolerance dyslipidemia hypertension well controlled  Recheck 6 months weight loss low salt diet encouraged Home blood pressure  monitoring encouraged Medicines refilled

## 2012-09-08 ENCOUNTER — Other Ambulatory Visit: Payer: Self-pay | Admitting: Internal Medicine

## 2013-03-12 ENCOUNTER — Encounter (HOSPITAL_COMMUNITY): Payer: Self-pay | Admitting: Cardiology

## 2013-03-12 ENCOUNTER — Emergency Department (HOSPITAL_COMMUNITY): Payer: Medicare Other

## 2013-03-12 ENCOUNTER — Emergency Department (HOSPITAL_COMMUNITY)
Admission: EM | Admit: 2013-03-12 | Discharge: 2013-03-12 | Disposition: A | Discharge status: Home / Self Care | Payer: Medicare Other | Attending: Emergency Medicine | Admitting: Emergency Medicine

## 2013-03-12 DIAGNOSIS — K219 Gastro-esophageal reflux disease without esophagitis: Secondary | ICD-10-CM | POA: Insufficient documentation

## 2013-03-12 DIAGNOSIS — I10 Essential (primary) hypertension: Secondary | ICD-10-CM | POA: Insufficient documentation

## 2013-03-12 DIAGNOSIS — X500XXA Overexertion from strenuous movement or load, initial encounter: Secondary | ICD-10-CM | POA: Insufficient documentation

## 2013-03-12 DIAGNOSIS — Y9289 Other specified places as the place of occurrence of the external cause: Secondary | ICD-10-CM | POA: Insufficient documentation

## 2013-03-12 DIAGNOSIS — Y9301 Activity, walking, marching and hiking: Secondary | ICD-10-CM | POA: Insufficient documentation

## 2013-03-12 DIAGNOSIS — Z791 Long term (current) use of non-steroidal anti-inflammatories (NSAID): Secondary | ICD-10-CM | POA: Insufficient documentation

## 2013-03-12 DIAGNOSIS — Z9889 Other specified postprocedural states: Secondary | ICD-10-CM | POA: Insufficient documentation

## 2013-03-12 DIAGNOSIS — J45909 Unspecified asthma, uncomplicated: Secondary | ICD-10-CM | POA: Insufficient documentation

## 2013-03-12 DIAGNOSIS — Z79899 Other long term (current) drug therapy: Secondary | ICD-10-CM | POA: Insufficient documentation

## 2013-03-12 DIAGNOSIS — S99929A Unspecified injury of unspecified foot, initial encounter: Secondary | ICD-10-CM | POA: Insufficient documentation

## 2013-03-12 DIAGNOSIS — R52 Pain, unspecified: Secondary | ICD-10-CM | POA: Insufficient documentation

## 2013-03-12 DIAGNOSIS — E079 Disorder of thyroid, unspecified: Secondary | ICD-10-CM | POA: Insufficient documentation

## 2013-03-12 DIAGNOSIS — M25562 Pain in left knee: Secondary | ICD-10-CM

## 2013-03-12 DIAGNOSIS — S8990XA Unspecified injury of unspecified lower leg, initial encounter: Secondary | ICD-10-CM | POA: Insufficient documentation

## 2013-03-12 DIAGNOSIS — E785 Hyperlipidemia, unspecified: Secondary | ICD-10-CM | POA: Insufficient documentation

## 2013-03-12 HISTORY — DX: Gastro-esophageal reflux disease without esophagitis: K21.9

## 2013-03-12 MED ORDER — HYDROCODONE-ACETAMINOPHEN 5-325 MG PO TABS: 1.0000 | ORAL_TABLET | Freq: Three times a day (TID) | ORAL | Status: DC | PRN

## 2013-03-12 NOTE — ED Notes (Signed)
Daughter- [phone]

## 2013-03-12 NOTE — ED Provider Notes (Signed)
History    CSN: [id number] Arrival date & time 03/12/13  1716  First MD Initiated Contact with Patient 03/12/13 1723     Chief Complaint  Patient presents with  . Knee Pain   (Consider location/radiation/quality/duration/timing/severity/associated sxs/prior Treatment) HPI  Patient presents with left knee pain. She states that she twisted the knee 3 days ago while walking.  Since onset has been severe, not improved with Tylenol.  On the pain is diffuse about the knee, worse with ambulation or motion. There is no distal dysesthesia. After twisting, no fall.  No other focal complaints. She has a history of surgery knee in the distant past.  Past Medical History  Diagnosis Date  . Allergy   . Asthma   . Hyperlipidemia   . Hypertension   . Thyroid disease   . GERD (gastroesophageal reflux disease)    Past Surgical History  Procedure Laterality Date  . Appendectomy    . Cholecystectomy    . Abdominal hysterectomy    . Total knee arthroplasty     Family History  Problem Relation Age of Onset  . Cancer Mother     colon  . Heart disease Mother    History  Substance Use Topics  . Smoking status: Never Smoker   . Smokeless tobacco: Never Used  . Alcohol Use: No   OB History   Grav Para Term Preterm Abortions TAB SAB Ect Mult Living                 Review of Systems  Constitutional: Negative for fever and chills.       Per HPI, otherwise negative  Cardiovascular:       Per HPI, otherwise negative  Gastrointestinal: Negative for nausea.  Endocrine:       Negative aside from HPI  Genitourinary:       Neg aside from HPI   Musculoskeletal:       Per HPI, otherwise negative  Skin: Negative for wound.  Neurological: Negative for weakness.    Allergies  Codeine  Home Medications   Current Outpatient Rx  Name  Route  Sig  Dispense  Refill  . acetaminophen (TYLENOL) 500 MG tablet   Oral   Take 1,000 mg by mouth every 6 (six) hours as needed. For occasional  pain relief         . albuterol (PROVENTIL HFA;VENTOLIN HFA) 108 (90 BASE) MCG/ACT inhaler   Inhalation   Inhale 1-2 puffs into the lungs every 6 (six) hours as needed for wheezing.   1 Inhaler   0   . amLODipine (NORVASC) 5 MG tablet   Oral   Take 1 tablet (5 mg total) by mouth every morning.   90 tablet   6   . benazepril (LOTENSIN) 20 MG tablet   Oral   Take 1 tablet (20 mg total) by mouth daily.   90 tablet   6   . celecoxib (CELEBREX) 50 MG capsule               . furosemide (LASIX) 40 MG tablet   Oral   Take 1 tablet (40 mg total) by mouth daily. May take an additional evening dose if legs are swelling   90 tablet   6   . levalbuterol (XOPENEX HFA) 45 MCG/ACT inhaler   Inhalation   Inhale 1-2 puffs into the lungs every 4 (four) hours as needed for wheezing.   1 Inhaler   12   . levothyroxine (SYNTHROID, LEVOTHROID)  100 MCG tablet   Oral   Take 1 tablet (100 mcg total) by mouth daily.   90 tablet   6     Needs to be seen for future refills- last 06-2011   . LORazepam (ATIVAN) 0.5 MG tablet   Oral   Take 1 tablet (0.5 mg total) by mouth 2 (two) times daily as needed. Anxiety   60 tablet   0   . naproxen sodium (ALEVE) 220 MG tablet   Oral   Take 440 mg by mouth 2 (two) times daily as needed. For occasional knee pain         . ranitidine (ZANTAC) 150 MG tablet      TAKE 1 TABLET BY MOUTH EVERY DAY   90 tablet   2   . simvastatin (ZOCOR) 40 MG tablet   Oral   Take 1 tablet (40 mg total) by mouth every evening.   90 tablet   6    BP 142/45  Pulse 48  Temp(Src) 97.9 F (36.6 C) (Oral)  Resp 18  SpO2 95% Physical Exam  Nursing note and vitals reviewed. Constitutional: She is oriented to person, place, and time. She appears well-developed and well-nourished. No distress.  HENT:  Head: Normocephalic and atraumatic.  Eyes: Conjunctivae and EOM are normal.  Cardiovascular: Normal rate, regular rhythm and intact distal pulses.    Pulmonary/Chest: Effort normal and breath sounds normal. No stridor. No respiratory distress.  Abdominal: She exhibits no distension.  Musculoskeletal: She exhibits no edema.  The left knee has no gross deformity, is symmetric in size to the right. There is mild tenderness to palpation about the tibial prominence, but no erythema, no ecchymosis. Range of motion is limited to 90/180.  Strength is appropriate with both flexion and extension. Hip and ankle on the ipsilateral side are unremarkable.   Neurological: She is alert and oriented to person, place, and time. No cranial nerve deficit.  Skin: Skin is warm and dry.  Psychiatric: She has a normal mood and affect.    ED Course  Procedures (including critical care time) Labs Reviewed - No data to display No results found. No diagnosis found.   7:58 PM Patient appears calm. I reviewed radiographic findings with the patient and her daughter. We discussed additional imaging, such as CT for additional evaluation.  Patient deferred this recommendation, stated that she continues to have pain she'll followup with her orthopedist.  MDM  As of now presents with his after minor trauma to the knee with persistent pain.  On exam she is awake and alert, neurologically intact.  Range of motion is limited, but there is no significant deformity.  X-rays are reassuring.  After initiation analgesics, patient was discharged in stable condition to follow up with orthopedist.  Gerhard Munch, MD 03/12/13 [phone]

## 2013-03-12 NOTE — ED Notes (Signed)
Pt to department via EMS- pt reports that she twisted her left knee walking down a ramp. States that she is having severe pain now. 9/10 pain. Pt with knee brace for transport. 20g LAC fent. Bp-155/111 Hr-56

## 2013-03-17 ENCOUNTER — Other Ambulatory Visit: Payer: Self-pay | Admitting: Internal Medicine

## 2013-06-14 ENCOUNTER — Ambulatory Visit: Payer: Medicare Other | Admitting: Internal Medicine

## 2013-12-04 ENCOUNTER — Emergency Department (INDEPENDENT_AMBULATORY_CARE_PROVIDER_SITE_OTHER): Payer: Medicare Other

## 2013-12-04 ENCOUNTER — Encounter (HOSPITAL_COMMUNITY): Payer: Self-pay | Admitting: Emergency Medicine

## 2013-12-04 ENCOUNTER — Emergency Department (HOSPITAL_COMMUNITY)
Admission: EM | Admit: 2013-12-04 | Discharge: 2013-12-04 | Disposition: A | Discharge status: Home / Self Care | Payer: Medicare Other | Source: Home / Self Care | Attending: Emergency Medicine | Admitting: Emergency Medicine

## 2013-12-04 DIAGNOSIS — J45909 Unspecified asthma, uncomplicated: Secondary | ICD-10-CM

## 2013-12-04 MED ORDER — ALBUTEROL SULFATE HFA 108 (90 BASE) MCG/ACT IN AERS: 2.0000 | INHALATION_SPRAY | RESPIRATORY_TRACT | Status: DC | PRN

## 2013-12-04 MED ORDER — ALBUTEROL SULFATE (2.5 MG/3ML) 0.083% IN NEBU
5.0000 mg | INHALATION_SOLUTION | Freq: Once | RESPIRATORY_TRACT | Status: AC
  Administered 2013-12-04: 5 mg via RESPIRATORY_TRACT

## 2013-12-04 MED ORDER — ALBUTEROL SULFATE (2.5 MG/3ML) 0.083% IN NEBU
INHALATION_SOLUTION | RESPIRATORY_TRACT | Status: AC
  Filled 2013-12-04: qty 6

## 2013-12-04 MED ORDER — ALBUTEROL SULFATE (2.5 MG/3ML) 0.083% IN NEBU: 2.5000 mg | INHALATION_SOLUTION | RESPIRATORY_TRACT | Status: DC | PRN

## 2013-12-04 MED ORDER — PREDNISONE 20 MG PO TABS
ORAL_TABLET | ORAL | Status: AC
  Filled 2013-12-04: qty 3

## 2013-12-04 MED ORDER — IPRATROPIUM-ALBUTEROL 0.5-2.5 (3) MG/3ML IN SOLN
3.0000 mL | Freq: Once | RESPIRATORY_TRACT | Status: AC
  Administered 2013-12-04: 3 mL via RESPIRATORY_TRACT

## 2013-12-04 MED ORDER — PREDNISONE 20 MG PO TABS: 20.0000 mg | ORAL_TABLET | Freq: Two times a day (BID) | ORAL | Status: DC

## 2013-12-04 MED ORDER — PREDNISONE 20 MG PO TABS
60.0000 mg | ORAL_TABLET | Freq: Once | ORAL | Status: AC
  Administered 2013-12-04: 60 mg via ORAL

## 2013-12-04 MED ORDER — BENZONATATE 200 MG PO CAPS: 200.0000 mg | ORAL_CAPSULE | Freq: Three times a day (TID) | ORAL | Status: DC | PRN

## 2013-12-04 MED ORDER — AZITHROMYCIN 250 MG PO TABS: ORAL_TABLET | ORAL | Status: DC

## 2013-12-04 MED ORDER — IPRATROPIUM-ALBUTEROL 0.5-2.5 (3) MG/3ML IN SOLN
RESPIRATORY_TRACT | Status: AC
  Filled 2013-12-04: qty 6

## 2013-12-04 NOTE — ED Provider Notes (Signed)
Chief Complaint   Chief Complaint  Patient presents with  . Cough    History of Present Illness   [name] Case is an 80 year old female who has had a five-day history of dry cough, wheezing, chest tightness, nasal congestion, rhinorrhea, aching in the ears, and sore throat. She denies any fever, chills, chest pain, or GI symptoms. She has no prior history of asthma or lung problems.  Review of Systems   Other than as noted above, the patient denies any of the following symptoms: Systemic:  No fevers, chills, sweats, or myalgias. Eye:  No redness or discharge. ENT:  No ear pain, headache, nasal congestion, drainage, sinus pressure, or sore throat. Neck:  No neck pain, stiffness, or swollen glands. Lungs:  No cough, sputum production, hemoptysis, wheezing, chest tightness, shortness of breath or chest pain. GI:  No abdominal pain, nausea, vomiting or diarrhea.  [place]   Past medical history, family history, social history, meds, and allergies were reviewed. She has hypertension and hypothyroidism. She takes Synthroid, amlodipine, and Lotensin.  Physical exam   Vital signs:  BP 163/71  Pulse 81  Temp(Src) 98.6 F (37 C) (Oral)  Resp 24  SpO2 94% General:  Alert and oriented.  In no distress.  Skin warm and dry. Eye:  No conjunctival injection or drainage. Lids were normal. ENT:  TMs and canals were normal, without erythema or inflammation.  Nasal mucosa was clear and uncongested, without drainage.  Mucous membranes were moist.  Pharynx was clear with no exudate or drainage.  There were no oral ulcerations or lesions. Neck:  Supple, no adenopathy, tenderness or mass. Lungs:  No respiratory distress.  She has bilateral expiratory wheezes without rales or rhonchi.  Heart:  Regular rhythm, without gallops, murmers or rubs. Skin:  Clear, warm, and dry, without rash or lesions.  Radiology   Dg Chest 2 View  12/04/2013   CLINICAL DATA:  Cough and difficulty breathing  EXAM: CHEST   2 VIEW  COMPARISON:  July 02, 2012  FINDINGS: There is no edema or consolidation. Heart is borderline enlarged with normal pulmonary vascularity. No adenopathy. No bone lesions. .  IMPRESSION: Borderline cardiac enlargement.  No edema or consolidation.   Electronically Signed   By: Lowella Grip M.D.   On: 12/04/2013 16:48   Course in Urgent [place]   She was given a DuoNeb breathing treatment and thereafter she felt a lot better. She still had some scattered wheezes and was not completely wheeze free but her lungs sounded better with good air movement.  Assessment     The encounter diagnosis was Asthmatic bronchitis.  Plan    1.  Meds:  The following meds were prescribed:   Discharge Medication List as of 12/04/2013  5:34 PM    START taking these medications   Details  albuterol (PROVENTIL) (2.5 MG/3ML) 0.083% nebulizer solution Take 3 mLs (2.5 mg total) by nebulization every 4 (four) hours as needed for wheezing., Starting 12/04/2013, Until Discontinued, Normal    azithromycin (ZITHROMAX Z-PAK) 250 MG tablet Take as directed., Normal    benzonatate (TESSALON) 200 MG capsule Take 1 capsule (200 mg total) by mouth 3 (three) times daily as needed for cough., Starting 12/04/2013, Until Discontinued, Normal    predniSONE (DELTASONE) 20 MG tablet Take 1 tablet (20 mg total) by mouth 2 (two) times daily., Starting 12/04/2013, Until Discontinued, Normal        2.  Patient Education/Counseling:  The patient was given appropriate handouts, self care  instructions, and instructed in symptomatic relief.  Instructed to get extra fluids, rest, and use a cool mist vaporizer.    3.  Follow up:  The patient was told to follow up here if no better in 3 to 4 days, or sooner if becoming worse in any way, and given some red flag symptoms such as increasing fever, difficulty breathing, chest pain, or persistent vomiting which would prompt immediate return.  Follow up here as needed.      Harden Mo, MD 12/04/13 2132

## 2013-12-04 NOTE — ED Notes (Signed)
Pt c/o cough x 4 days. Pt reports she has a hx of bronchitits and feels tightness in her chest. Coughing and wheezing while giving medical history. Pt denies SOB but does have pain in back. Pt is alert and oriented.

## 2013-12-04 NOTE — Discharge Instructions (Signed)
Asthma Attack Prevention Although there is no way to prevent asthma from starting, you can take steps to control the disease and reduce its symptoms. Learn about your asthma and how to control it. Take an active role to control your asthma by working with your health care provider to create and follow an asthma action plan. An asthma action plan guides you in:  Taking your medicines properly.  Avoiding things that set off your asthma or make your asthma worse (asthma triggers).  Tracking your level of asthma control.  Responding to worsening asthma.  Seeking emergency care when needed. To track your asthma, keep records of your symptoms, check your peak flow number using a handheld device that shows how well air moves out of your lungs (peak flow meter), and get regular asthma checkups.  WHAT ARE SOME WAYS TO PREVENT AN ASTHMA ATTACK?  Take medicines as directed by your health care provider.  Keep track of your asthma symptoms and level of control.  With your health care provider, write a detailed plan for taking medicines and managing an asthma attack. Then be sure to follow your action plan. Asthma is an ongoing condition that needs regular monitoring and treatment.  Identify and avoid asthma triggers. Many outdoor allergens and irritants (such as pollen, mold, cold air, and air pollution) can trigger asthma attacks. Find out what your asthma triggers are and take steps to avoid them.  Monitor your breathing. Learn to recognize warning signs of an attack, such as coughing, wheezing, or shortness of breath. Your lung function may decrease before you notice any signs or symptoms, so regularly measure and record your peak airflow with a home peak flow meter.  Identify and treat attacks early. If you act quickly, you are less likely to have a severe attack. You will also need less medicine to control your symptoms. When your peak flow measurements decrease and alert you to an upcoming attack,  take your medicine as instructed and immediately stop any activity that may have triggered the attack. If your symptoms do not improve, get medical help.  Pay attention to increasing quick-relief inhaler use. If you find yourself relying on your quick-relief inhaler, your asthma is not under control. See your health care provider about adjusting your treatment. WHAT CAN MAKE MY SYMPTOMS WORSE? A number of common things can set off or make your asthma symptoms worse and cause temporary increased inflammation of your airways. Keep track of your asthma symptoms for several weeks, detailing all the environmental and emotional factors that are linked with your asthma. When you have an asthma attack, go back to your asthma diary to see which factor, or combination of factors, might have contributed to it. Once you know what these factors are, you can take steps to control many of them. If you have allergies and asthma, it is important to take asthma prevention steps at home. Minimizing contact with the substance to which you are allergic will help prevent an asthma attack. Some triggers and ways to avoid these triggers are: Animal Dander:  Some people are allergic to the flakes of skin or dried saliva from animals with fur or feathers.   There is no such thing as a hypoallergenic dog or cat breed. All dogs or cats can cause allergies, even if they don't shed.  Keep these pets out of your home.  If you are not able to keep a pet outdoors, keep the pet out of your bedroom and other sleeping areas at all  times, and keep the door closed.  Remove carpets and furniture covered with cloth from your home. If that is not possible, keep the pet away from fabric-covered furniture and carpets. Dust Mites: Many people with asthma are allergic to dust mites. Dust mites are tiny bugs that are found in every home in mattresses, pillows, carpets, fabric-covered furniture, bedcovers, clothes, stuffed toys, and other  fabric-covered items.   Cover your mattress in a special dust-proof cover.  Cover your pillow in a special dust-proof cover, or wash the pillow each week in hot water. Water must be hotter than 130 F (54.4 C) to kill dust mites. Cold or warm water used with detergent and bleach can also be effective.  Wash the sheets and blankets on your bed each week in hot water.  Try not to sleep or lie on cloth-covered cushions.  Call ahead when traveling and ask for a smoke-free hotel room. Bring your own bedding and pillows in case the hotel only supplies feather pillows and down comforters, which may contain dust mites and cause asthma symptoms.  Remove carpets from your bedroom and those laid on concrete, if you can.  Keep stuffed toys out of the bed, or wash the toys weekly in hot water or cooler water with detergent and bleach. Cockroaches: Many people with asthma are allergic to the droppings and remains of cockroaches.   Keep food and garbage in closed containers. Never leave food out.  Use poison baits, traps, powders, gels, or paste (for example, boric acid).  If a spray is used to kill cockroaches, stay out of the room until the odor goes away. Indoor Mold:  Fix leaky faucets, pipes, or other sources of water that have mold around them.  Clean floors and moldy surfaces with a fungicide or diluted bleach.  Avoid using humidifiers, vaporizers, or swamp coolers. These can spread molds through the air. Pollen and Outdoor Mold:  When pollen or mold spore counts are high, try to keep your windows closed.  Stay indoors with windows closed from late morning to afternoon. Pollen and some mold spore counts are highest at that time.  Ask your health care provider whether you need to take anti-inflammatory medicine or increase your dose of the medicine before your allergy season starts. Other Irritants to Avoid:  Tobacco smoke is an irritant. If you smoke, ask your health care provider how  you can quit. Ask family members to quit smoking too. Do not allow smoking in your home or car.  If possible, do not use a wood-burning stove, kerosene heater, or fireplace. Minimize exposure to all sources of smoke, including to incense, candles, fires, and fireworks.  Try to stay away from strong odors and sprays, such as perfume, talcum powder, hair spray, and paints.  Decrease humidity in your home and use an indoor air cleaning device. Reduce indoor humidity to below 60%. Dehumidifiers or central air conditioners can do this.  Decrease house dust exposure by changing furnace and air cooler filters frequently.  Try to have someone else vacuum for you once or twice a week. Stay out of rooms while they are being vacuumed and for a short while afterward.  If you vacuum, use a dust mask from a hardware store, a double-layered or microfilter vacuum cleaner bag, or a vacuum cleaner with a HEPA filter.  Sulfites in foods and beverages can be irritants. Do not drink beer or wine or eat dried fruit, processed potatoes, or shrimp if they cause asthma symptoms.  Cold air can trigger an asthma attack. Cover your nose and mouth with a scarf on cold or windy days.  Several health conditions can make asthma more difficult to manage, including a runny nose, sinus infections, reflux disease, psychological stress, and sleep apnea. Work with your health care provider to manage these conditions.  Avoid close contact with people who have a respiratory infection such as a cold or the flu, since your asthma symptoms may get worse if you catch the infection. Wash your hands thoroughly after touching items that may have been handled by people with a respiratory infection.  Get a flu shot every year to protect against the flu virus, which often makes asthma worse for days or weeks. Also get a pneumonia shot if you have not previously had one. Unlike the flu shot, the pneumonia shot does not need to be given  yearly. Medicines:  Talk to your health care provider about whether it is safe for you to take aspirin or non-steroidal anti-inflammatory medicines (NSAIDs). In a small number of people with asthma, aspirin and NSAIDs can cause asthma attacks. These medicines must be avoided by people who have known aspirin-sensitive asthma. It is important that people with aspirin-sensitive asthma read labels of all over-the-counter medicines used to treat pain, colds, coughs, and fever.  Beta blockers and ACE inhibitors are other medicines you should discuss with your health care provider. HOW CAN I FIND OUT WHAT I AM ALLERGIC TO? Ask your asthma health care provider about allergy skin testing or blood testing (the RAST test) to identify the allergens to which you are sensitive. If you are found to have allergies, the most important thing to do is to try to avoid exposure to any allergens that you are sensitive to as much as possible. Other treatments for allergies, such as medicines and allergy shots (immunotherapy) are available.  CAN I EXERCISE? Follow your health care provider's advice regarding asthma treatment before exercising. It is important to maintain a regular exercise program, but vigorous exercise, or exercise in cold, humid, or dry environments can cause asthma attacks, especially for those people who have exercise-induced asthma. Document Released: 08/06/2009 Document Revised: 04/20/2013 Document Reviewed: 02/23/2013 [facility] Patient Information 2014 [place].  Bronchitis Bronchitis is inflammation of the airways that extend from the windpipe into the lungs (bronchi). The inflammation often causes mucus to develop, which leads to a cough. If the inflammation becomes severe, it may cause shortness of breath. CAUSES  Bronchitis may be caused by:   Viral infections.   Bacteria.   Cigarette smoke.   Allergens, pollutants, and other irritants.  SIGNS AND SYMPTOMS  The most common  symptom of bronchitis is a frequent cough that produces mucus. Other symptoms include:  Fever.   Body aches.   Chest congestion.   Chills.   Shortness of breath.   Sore throat.  DIAGNOSIS  Bronchitis is usually diagnosed through a medical history and physical exam. Tests, such as chest X-rays, are sometimes done to rule out other conditions.  TREATMENT  You may need to avoid contact with whatever caused the problem (smoking, for example). Medicines are sometimes needed. These may include:  Antibiotics. These may be prescribed if the condition is caused by bacteria.  Cough suppressants. These may be prescribed for relief of cough symptoms.   Inhaled medicines. These may be prescribed to help open your airways and make it easier for you to breathe.   Steroid medicines. These may be prescribed for those with recurrent (chronic)  bronchitis. HOME CARE INSTRUCTIONS  Get plenty of rest.   Drink enough fluids to keep your urine clear or pale yellow (unless you have a medical condition that requires fluid restriction). Increasing fluids may help thin your secretions and will prevent dehydration.   Only take over-the-counter or prescription medicines as directed by your health care provider.  Only take antibiotics as directed. Make sure you finish them even if you start to feel better.  Avoid secondhand smoke, irritating chemicals, and strong fumes. These will make bronchitis worse. If you are a smoker, quit smoking. Consider using nicotine gum or skin patches to help control withdrawal symptoms. Quitting smoking will help your lungs heal faster.   Put a cool-mist humidifier in your bedroom at night to moisten the air. This may help loosen mucus. Change the water in the humidifier daily. You can also run the hot water in your shower and sit in the bathroom with the door closed for 5 10 minutes.   Follow up with your health care provider as directed.   Wash your hands  frequently to avoid catching bronchitis again or spreading an infection to others.  SEEK MEDICAL CARE IF: Your symptoms do not improve after 1 week of treatment.  SEEK IMMEDIATE MEDICAL CARE IF:  Your fever increases.  You have chills.   You have chest pain.   You have worsening shortness of breath.   You have bloody sputum.  You faint.  You have lightheadedness.  You have a severe headache.   You vomit repeatedly. MAKE SURE YOU:   Understand these instructions.  Will watch your condition.  Will get help right away if you are not doing well or get worse. Document Released: 08/18/2005 Document Revised: 06/08/2013 Document Reviewed: 04/12/2013 [facility] Patient Information 2014 [place].

## 2013-12-12 ENCOUNTER — Emergency Department (HOSPITAL_COMMUNITY): Payer: Medicare Other

## 2013-12-12 ENCOUNTER — Encounter (HOSPITAL_COMMUNITY): Payer: Self-pay | Admitting: Emergency Medicine

## 2013-12-12 DIAGNOSIS — J029 Acute pharyngitis, unspecified: Secondary | ICD-10-CM | POA: Insufficient documentation

## 2013-12-12 DIAGNOSIS — I10 Essential (primary) hypertension: Secondary | ICD-10-CM | POA: Insufficient documentation

## 2013-12-12 DIAGNOSIS — J45909 Unspecified asthma, uncomplicated: Secondary | ICD-10-CM | POA: Insufficient documentation

## 2013-12-12 NOTE — ED Notes (Signed)
Pt sattes she was seen at urgent care recently and DX with bronchitis.  Pt states she has not gotten better and is concerned it is getting worse

## 2013-12-13 ENCOUNTER — Emergency Department (HOSPITAL_COMMUNITY)
Admission: EM | Admit: 2013-12-13 | Discharge: 2013-12-13 | Discharge status: Against Medical Advice | Payer: Medicare Other | Attending: Emergency Medicine | Admitting: Emergency Medicine

## 2014-06-28 ENCOUNTER — Emergency Department (HOSPITAL_COMMUNITY): Payer: Medicare Other

## 2014-06-28 ENCOUNTER — Encounter (HOSPITAL_COMMUNITY): Payer: Self-pay | Admitting: Emergency Medicine

## 2014-06-28 ENCOUNTER — Emergency Department (HOSPITAL_COMMUNITY)
Admission: EM | Admit: 2014-06-28 | Discharge: 2014-06-28 | Disposition: A | Discharge status: Home / Self Care | Payer: Medicare Other | Attending: Emergency Medicine | Admitting: Emergency Medicine

## 2014-06-28 DIAGNOSIS — Z23 Encounter for immunization: Secondary | ICD-10-CM | POA: Diagnosis not present

## 2014-06-28 DIAGNOSIS — Z8719 Personal history of other diseases of the digestive system: Secondary | ICD-10-CM | POA: Insufficient documentation

## 2014-06-28 DIAGNOSIS — S0031XA Abrasion of nose, initial encounter: Secondary | ICD-10-CM | POA: Insufficient documentation

## 2014-06-28 DIAGNOSIS — E079 Disorder of thyroid, unspecified: Secondary | ICD-10-CM | POA: Diagnosis not present

## 2014-06-28 DIAGNOSIS — Z79899 Other long term (current) drug therapy: Secondary | ICD-10-CM | POA: Insufficient documentation

## 2014-06-28 DIAGNOSIS — I10 Essential (primary) hypertension: Secondary | ICD-10-CM | POA: Diagnosis not present

## 2014-06-28 DIAGNOSIS — S43401A Unspecified sprain of right shoulder joint, initial encounter: Secondary | ICD-10-CM | POA: Insufficient documentation

## 2014-06-28 DIAGNOSIS — J45909 Unspecified asthma, uncomplicated: Secondary | ICD-10-CM | POA: Diagnosis not present

## 2014-06-28 DIAGNOSIS — Y92017 Garden or yard in single-family (private) house as the place of occurrence of the external cause: Secondary | ICD-10-CM | POA: Diagnosis not present

## 2014-06-28 DIAGNOSIS — Y9301 Activity, walking, marching and hiking: Secondary | ICD-10-CM | POA: Insufficient documentation

## 2014-06-28 DIAGNOSIS — W01198A Fall on same level from slipping, tripping and stumbling with subsequent striking against other object, initial encounter: Secondary | ICD-10-CM | POA: Insufficient documentation

## 2014-06-28 DIAGNOSIS — S0992XA Unspecified injury of nose, initial encounter: Secondary | ICD-10-CM | POA: Diagnosis present

## 2014-06-28 DIAGNOSIS — W19XXXA Unspecified fall, initial encounter: Secondary | ICD-10-CM

## 2014-06-28 DIAGNOSIS — S0081XA Abrasion of other part of head, initial encounter: Secondary | ICD-10-CM

## 2014-06-28 MED ORDER — TETANUS-DIPHTH-ACELL PERTUSSIS 5-2.5-18.5 LF-MCG/0.5 IM SUSP
0.5000 mL | Freq: Once | INTRAMUSCULAR | Status: AC
  Administered 2014-06-28: 0.5 mL via INTRAMUSCULAR
  Filled 2014-06-28: qty 0.5

## 2014-06-28 MED ORDER — BACITRACIN 500 UNIT/GM EX OINT
1.0000 "application " | TOPICAL_OINTMENT | Freq: Two times a day (BID) | CUTANEOUS | Status: DC
  Administered 2014-06-28: 1 via TOPICAL
  Filled 2014-06-28: qty 0.9

## 2014-06-28 MED ORDER — HYDROCODONE-ACETAMINOPHEN 5-325 MG PO TABS: ORAL_TABLET | ORAL | Status: DC

## 2014-06-28 MED ORDER — IBUPROFEN 400 MG PO TABS
400.0000 mg | ORAL_TABLET | Freq: Once | ORAL | Status: AC
  Administered 2014-06-28: 400 mg via ORAL
  Filled 2014-06-28: qty 1

## 2014-06-28 MED ORDER — OXYCODONE-ACETAMINOPHEN 5-325 MG PO TABS
1.0000 | ORAL_TABLET | Freq: Once | ORAL | Status: AC
Start: 2014-06-28 — End: 2014-06-28
  Administered 2014-06-28: 1 via ORAL
  Filled 2014-06-28: qty 1

## 2014-06-28 NOTE — ED Notes (Signed)
Pt fell onto brick. Pt lost balance. No LOC. Pt has pain in right upper shoulder and bridge of nose. Pt has towel to support C spine= but denies cervical pain. BP120/68, HR 80. Pt alert and oriented.

## 2014-06-28 NOTE — Discharge Instructions (Signed)
Take vicodin for breakthrough pain, do not drink alcohol, drive, care for children or do other critical tasks while taking vicodin. Please be very careful not to fall! The pain medication puts you at risk for falls. Please rest as much as possible and try to not stay alone.   Only use the arm sling for up to 2 days. Take the arm out and rotate the shoulder every 4 hours.   Wash the affected area with soap and water and apply a thin layer of topical antibiotic ointment. Do this every 12 hours.   Do not use rubbing alcohol or hydrogen peroxide.                        Look for signs of infection: if you see redness, if the area becomes warm, if pain increases sharply, there is discharge (pus), if red streaks appear or you develop fever or vomiting, RETURN immediately to the Emergency Department  for a recheck.   Please follow with your primary care doctor in the next 2 days for a check-up. They must obtain records for further management.   Do not hesitate to return to the Emergency Department for any new, worsening or concerning symptoms.

## 2014-06-28 NOTE — ED Notes (Signed)
Pt did not come with C collar on. Immobilized pt's neck with towels per PA verbal order. Pt not complaining of cervical pain. Cleared pt off back board, pt denies any back pain.

## 2014-06-28 NOTE — ED Provider Notes (Signed)
CSN: [id number]     Arrival date & time 06/28/14  1623 History   First MD Initiated Contact with Patient 06/28/14 1654     Chief Complaint  Patient presents with  . Fall     (Consider location/radiation/quality/duration/timing/severity/associated sxs/prior Treatment) HPI  [name] Case is a 80 y.o. female brought in by EMS for fall with head trauma. Patient was walking in her garden it was muddy, the papers weren't even and she slipped and fell forward. She is not taking any blood thinners. She did not lose consciousness. Patient has pain swelling and abrasions around the nasal bridge. Reports a resolved nosebleed. She also reports a right shoulder pain which she rates at 6 out of 10. No pain medication taken prior to arrival. She denies change in vision, pain with eye movement, dysarthria, ataxia, cervicalgia, chest pain, shortness of breath, abdominal pain, pelvic pain, difficulty moving major joints. Last tetanus shot is unknown.   Past Medical History  Diagnosis Date  . Allergy   . Asthma   . Hyperlipidemia   . Hypertension   . Thyroid disease   . GERD (gastroesophageal reflux disease)    Past Surgical History  Procedure Laterality Date  . Appendectomy    . Cholecystectomy    . Abdominal hysterectomy    . Total knee arthroplasty     Family History  Problem Relation Age of Onset  . Cancer Mother     colon  . Heart disease Mother    History  Substance Use Topics  . Smoking status: Never Smoker   . Smokeless tobacco: Never Used  . Alcohol Use: No   OB History   Grav Para Term Preterm Abortions TAB SAB Ect Mult Living                 Review of Systems  10 systems reviewed and found to be negative, except as noted in the HPI.    Allergies  Codeine  Home Medications   Prior to Admission medications   Medication Sig Start Date End Date Taking? Authorizing Provider  albuterol (PROVENTIL HFA;VENTOLIN HFA) 108 (90 BASE) MCG/ACT inhaler Inhale 2 puffs into the  lungs every 4 (four) hours as needed for wheezing or shortness of breath. 12/04/13  Yes Harden Mo, MD  albuterol (PROVENTIL) (2.5 MG/3ML) 0.083% nebulizer solution Take 3 mLs (2.5 mg total) by nebulization every 4 (four) hours as needed for wheezing. 12/04/13  Yes Harden Mo, MD  amLODipine (NORVASC) 5 MG tablet Take 1 tablet (5 mg total) by mouth every morning. 08/19/12  Yes Marletta Lor, MD  benazepril (LOTENSIN) 20 MG tablet Take 1 tablet (20 mg total) by mouth daily. 08/19/12  Yes Marletta Lor, MD  furosemide (LASIX) 40 MG tablet Take 1 tablet (40 mg total) by mouth daily. May take an additional evening dose if legs are swelling 08/19/12  Yes Marletta Lor, MD  levalbuterol [facility] HFA) 45 MCG/ACT inhaler Inhale 1-2 puffs into the lungs every 4 (four) hours as needed for wheezing. 07/03/12  Yes John L Molpus, MD  levothyroxine (SYNTHROID, LEVOTHROID) 100 MCG tablet Take 1 tablet (100 mcg total) by mouth daily. 08/19/12  Yes Marletta Lor, MD  albuterol (PROVENTIL HFA;VENTOLIN HFA) 108 (90 BASE) MCG/ACT inhaler Inhale 1-2 puffs into the lungs every 6 (six) hours as needed for wheezing. 08/19/12 08/19/13  Marletta Lor, MD  HYDROcodone-acetaminophen (NORCO/VICODIN) 5-325 MG per tablet Take 1-2 tablets by mouth every 6 hours as needed for pain. 06/28/14  Samiya Mervin, PA-C   BP 131/54  Pulse 58  Temp(Src) 98.6 F (37 C) (Oral)  Resp 18  Ht 5\' 3"  (1.6 m)  Wt 218 lb (98.884 kg)  BMI 38.63 kg/m2  SpO2 92% Physical Exam  Nursing note and vitals reviewed. Constitutional: She is oriented to person, place, and time. She appears well-developed and well-nourished.  HENT:  Right Ear: External ear normal.  Left Ear: External ear normal.  Mouth/Throat: Oropharynx is clear and moist.  Mild abrasions and dried blood with swelling and tenderness to palpation along the nasal bridge.  Nasal septum is midline, dried blood in the right knee air with no septal  hematoma identified  No hemotympanum, battle signs or raccoon's eyes  No crepitance or tenderness to palpation along the orbital rim.  EOMI intact with no pain or diplopia  No abnormal otorrhea or rhinorrhea. Nasal septum midline.  No intraoral trauma. Dentures removed for full evaluation ()  Eyes: Conjunctivae and EOM are normal. Pupils are equal, round, and reactive to light.  Neck: Normal range of motion. Neck supple.  No midline C-spine  tenderness to palpation or step-offs appreciated. Patient has full range of motion without pain.   Cardiovascular: Normal rate, regular rhythm and intact distal pulses.   Pulmonary/Chest: Effort normal and breath sounds normal. No respiratory distress. She has no wheezes. She has no rales. She exhibits no tenderness.  No  TTP or crepitance  Abdominal: Soft. Bowel sounds are normal. She exhibits no distension and no mass. There is no tenderness. There is no rebound and no guarding.  Musculoskeletal: Normal range of motion. She exhibits no edema and no tenderness.  Right shoulder with excellent range of motion, drop arm is negative, no deformity or bruising. Distally neurovascularly intact.  Pelvis stable. No deformity or TTP of major joints.   Good ROM  Neurological: She is alert and oriented to person, place, and time.  Strength 5/5 x4 extremities   Distal sensation intact  Skin: Skin is warm.  Psychiatric: She has a normal mood and affect.    ED Course  Procedures (including critical care time) Labs Review Labs Reviewed - No data to display  Imaging Review Dg Shoulder Right  06/28/2014   CLINICAL DATA:  Fall on right side today.  Right shoulder pain.  EXAM: RIGHT SHOULDER - 2+ VIEW  COMPARISON:  04/05/2011  FINDINGS: Moderate degenerative changes in the right AC and glenohumeral joints. No acute bony abnormality. Specifically, no fracture, subluxation, or dislocation. Soft tissues are intact.  IMPRESSION: No acute bony abnormality.    Electronically Signed   By: Rolm Baptise M.D.   On: 06/28/2014 19:13   Ct Head Wo Contrast  06/28/2014   CLINICAL DATA:  Fall.  No loss of consciousness.  EXAM: CT HEAD WITHOUT CONTRAST  CT MAXILLOFACIAL WITHOUT CONTRAST  CT CERVICAL SPINE WITHOUT CONTRAST  TECHNIQUE: Multidetector CT imaging of the head, cervical spine, and maxillofacial structures were performed using the standard protocol without intravenous contrast. Multiplanar CT image reconstructions of the cervical spine and maxillofacial structures were also generated.  COMPARISON:  05/30/2011  FINDINGS: CT HEAD FINDINGS  No acute cortical infarct, hemorrhage, or mass lesion ispresent. Ventricles are of normal size. No significant extra-axial fluid collection is present. Mild mucosal thickening involves the left maxillary sinus. The mastoid air cells are clear. The osseous skull is intact.  CT MAXILLOFACIAL FINDINGS  Mucosal thickening involves the left maxillary sinus. No fluid levels are identified. The nasal bone is intact. There is  mild leftward deviation of the nasal septum. The mandible is located and appears intact. The nasal bones appear intact.The parotid and submandibular glands appear normal. No mass identified.  CT CERVICAL SPINE FINDINGS  Straightening of normal cervical lordosis. There is disc space narrowing and ventral endplate spurring. This is most advanced at C5-6 and C6-7. Bilateral facet hypertrophy and degenerative change noted.  IMPRESSION: 1. No acute intracranial abnormalities. 2. No evidence for facial bone fracture. 3. Mild mucosal thickening involves the left maxillary sinus. 4. No evidence for cervical spine fracture. 5. Cervical degenerative disc disease.   Electronically Signed   By: Kerby Moors M.D.   On: 06/28/2014 18:03   Ct Cervical Spine Wo Contrast  06/28/2014   CLINICAL DATA:  Fall.  No loss of consciousness.  EXAM: CT HEAD WITHOUT CONTRAST  CT MAXILLOFACIAL WITHOUT CONTRAST  CT CERVICAL SPINE WITHOUT  CONTRAST  TECHNIQUE: Multidetector CT imaging of the head, cervical spine, and maxillofacial structures were performed using the standard protocol without intravenous contrast. Multiplanar CT image reconstructions of the cervical spine and maxillofacial structures were also generated.  COMPARISON:  05/30/2011  FINDINGS: CT HEAD FINDINGS  No acute cortical infarct, hemorrhage, or mass lesion ispresent. Ventricles are of normal size. No significant extra-axial fluid collection is present. Mild mucosal thickening involves the left maxillary sinus. The mastoid air cells are clear. The osseous skull is intact.  CT MAXILLOFACIAL FINDINGS  Mucosal thickening involves the left maxillary sinus. No fluid levels are identified. The nasal bone is intact. There is mild leftward deviation of the nasal septum. The mandible is located and appears intact. The nasal bones appear intact.The parotid and submandibular glands appear normal. No mass identified.  CT CERVICAL SPINE FINDINGS  Straightening of normal cervical lordosis. There is disc space narrowing and ventral endplate spurring. This is most advanced at C5-6 and C6-7. Bilateral facet hypertrophy and degenerative change noted.  IMPRESSION: 1. No acute intracranial abnormalities. 2. No evidence for facial bone fracture. 3. Mild mucosal thickening involves the left maxillary sinus. 4. No evidence for cervical spine fracture. 5. Cervical degenerative disc disease.   Electronically Signed   By: Kerby Moors M.D.   On: 06/28/2014 18:03   Ct Maxillofacial Wo Cm  06/28/2014   CLINICAL DATA:  Fall.  No loss of consciousness.  EXAM: CT HEAD WITHOUT CONTRAST  CT MAXILLOFACIAL WITHOUT CONTRAST  CT CERVICAL SPINE WITHOUT CONTRAST  TECHNIQUE: Multidetector CT imaging of the head, cervical spine, and maxillofacial structures were performed using the standard protocol without intravenous contrast. Multiplanar CT image reconstructions of the cervical spine and maxillofacial structures  were also generated.  COMPARISON:  05/30/2011  FINDINGS: CT HEAD FINDINGS  No acute cortical infarct, hemorrhage, or mass lesion ispresent. Ventricles are of normal size. No significant extra-axial fluid collection is present. Mild mucosal thickening involves the left maxillary sinus. The mastoid air cells are clear. The osseous skull is intact.  CT MAXILLOFACIAL FINDINGS  Mucosal thickening involves the left maxillary sinus. No fluid levels are identified. The nasal bone is intact. There is mild leftward deviation of the nasal septum. The mandible is located and appears intact. The nasal bones appear intact.The parotid and submandibular glands appear normal. No mass identified.  CT CERVICAL SPINE FINDINGS  Straightening of normal cervical lordosis. There is disc space narrowing and ventral endplate spurring. This is most advanced at C5-6 and C6-7. Bilateral facet hypertrophy and degenerative change noted.  IMPRESSION: 1. No acute intracranial abnormalities. 2. No evidence for facial bone fracture.  3. Mild mucosal thickening involves the left maxillary sinus. 4. No evidence for cervical spine fracture. 5. Cervical degenerative disc disease.   Electronically Signed   By: Kerby Moors M.D.   On: 06/28/2014 18:03     EKG Interpretation None      MDM   Final diagnoses:  Fall  Shoulder sprain, right, initial encounter  Facial abrasion, initial encounter    Filed Vitals:   06/28/14 1636 06/28/14 1700 06/28/14 1830 06/28/14 1954  BP: 138/53 139/56 117/52 131/54  Pulse:    58  Temp: 98 F (36.7 C)   98.6 F (37 C)  TempSrc: Oral   Oral  Resp: 20 17 19 18   Height: 5\' 3"  (1.6 m)     Weight: 218 lb (98.884 kg)     SpO2: 96%   92%    Medications  bacitracin ointment 1 application (1 application Topical Given 06/28/14 1752)  oxyCODONE-acetaminophen (PERCOCET/ROXICET) 5-325 MG per tablet 1 tablet (1 tablet Oral Given 06/28/14 1743)  Tdap (BOOSTRIX) injection 0.5 mL (0.5 mLs Intramuscular Given  06/28/14 1741)  ibuprofen (ADVIL,MOTRIN) tablet 400 mg (400 mg Oral Given 06/28/14 1840)    Takeila Thayne is a 80 y.o. female presenting with facial trauma status post mechanical fall at home. Neuro exam is nonfocal. She's not taking any blood thinners. Maxillofacial CT, head CT and C-spine CT are negative. Patient has excellent range of motion to right shoulder. Shoulder x-ray is negative. Will give sling and pain control medication. Patient advised on wound care.  This is a shared visit with the attending physician who personally evaluated the patient and agrees with the care plan.   Evaluation does not show pathology that would require ongoing emergent intervention or inpatient treatment. Pt is hemodynamically stable and mentating appropriately. Discussed findings and plan with patient/guardian, who agrees with care plan. All questions answered. Return precautions discussed and outpatient follow up given.   Discharge Medication List as of 06/28/2014  7:30 PM    START taking these medications   Details  HYDROcodone-acetaminophen (NORCO/VICODIN) 5-325 MG per tablet Take 1-2 tablets by mouth every 6 hours as needed for pain., [employer], PA-C 06/28/14 [phone]

## 2014-06-28 NOTE — ED Notes (Signed)
Applied bacitracin to small abrasion to pt's nose and left finger

## 2014-06-29 NOTE — ED Provider Notes (Signed)
Medical screening examination/treatment/procedure(s) were conducted as a shared visit with non-physician practitioner(s) and myself.  I personally evaluated the patient during the encounter.   EKG Interpretation None      Overall well appearing.  Imaging negative for fracture.  Contusion of the face.  Discharged home in good condition.  Head injury warnings given.  Small abrasions to the face.  Recommended topical antibacterial ointment.  Infection warnings given.  Dg Shoulder Right  06/28/2014   CLINICAL DATA:  Fall on right side today.  Right shoulder pain.  EXAM: RIGHT SHOULDER - 2+ VIEW  COMPARISON:  04/05/2011  FINDINGS: Moderate degenerative changes in the right AC and glenohumeral joints. No acute bony abnormality. Specifically, no fracture, subluxation, or dislocation. Soft tissues are intact.  IMPRESSION: No acute bony abnormality.   Electronically Signed   By: Rolm Baptise M.D.   On: 06/28/2014 19:13   Ct Head Wo Contrast  06/28/2014   CLINICAL DATA:  Fall.  No loss of consciousness.  EXAM: CT HEAD WITHOUT CONTRAST  CT MAXILLOFACIAL WITHOUT CONTRAST  CT CERVICAL SPINE WITHOUT CONTRAST  TECHNIQUE: Multidetector CT imaging of the head, cervical spine, and maxillofacial structures were performed using the standard protocol without intravenous contrast. Multiplanar CT image reconstructions of the cervical spine and maxillofacial structures were also generated.  COMPARISON:  05/30/2011  FINDINGS: CT HEAD FINDINGS  No acute cortical infarct, hemorrhage, or mass lesion ispresent. Ventricles are of normal size. No significant extra-axial fluid collection is present. Mild mucosal thickening involves the left maxillary sinus. The mastoid air cells are clear. The osseous skull is intact.  CT MAXILLOFACIAL FINDINGS  Mucosal thickening involves the left maxillary sinus. No fluid levels are identified. The nasal bone is intact. There is mild leftward deviation of the nasal septum. The mandible is located  and appears intact. The nasal bones appear intact.The parotid and submandibular glands appear normal. No mass identified.  CT CERVICAL SPINE FINDINGS  Straightening of normal cervical lordosis. There is disc space narrowing and ventral endplate spurring. This is most advanced at C5-6 and C6-7. Bilateral facet hypertrophy and degenerative change noted.  IMPRESSION: 1. No acute intracranial abnormalities. 2. No evidence for facial bone fracture. 3. Mild mucosal thickening involves the left maxillary sinus. 4. No evidence for cervical spine fracture. 5. Cervical degenerative disc disease.   Electronically Signed   By: Kerby Moors M.D.   On: 06/28/2014 18:03   Ct Cervical Spine Wo Contrast  06/28/2014   CLINICAL DATA:  Fall.  No loss of consciousness.  EXAM: CT HEAD WITHOUT CONTRAST  CT MAXILLOFACIAL WITHOUT CONTRAST  CT CERVICAL SPINE WITHOUT CONTRAST  TECHNIQUE: Multidetector CT imaging of the head, cervical spine, and maxillofacial structures were performed using the standard protocol without intravenous contrast. Multiplanar CT image reconstructions of the cervical spine and maxillofacial structures were also generated.  COMPARISON:  05/30/2011  FINDINGS: CT HEAD FINDINGS  No acute cortical infarct, hemorrhage, or mass lesion ispresent. Ventricles are of normal size. No significant extra-axial fluid collection is present. Mild mucosal thickening involves the left maxillary sinus. The mastoid air cells are clear. The osseous skull is intact.  CT MAXILLOFACIAL FINDINGS  Mucosal thickening involves the left maxillary sinus. No fluid levels are identified. The nasal bone is intact. There is mild leftward deviation of the nasal septum. The mandible is located and appears intact. The nasal bones appear intact.The parotid and submandibular glands appear normal. No mass identified.  CT CERVICAL SPINE FINDINGS  Straightening of normal cervical lordosis. There is  disc space narrowing and ventral endplate spurring. This  is most advanced at C5-6 and C6-7. Bilateral facet hypertrophy and degenerative change noted.  IMPRESSION: 1. No acute intracranial abnormalities. 2. No evidence for facial bone fracture. 3. Mild mucosal thickening involves the left maxillary sinus. 4. No evidence for cervical spine fracture. 5. Cervical degenerative disc disease.   Electronically Signed   By: Kerby Moors M.D.   On: 06/28/2014 18:03   Ct Maxillofacial Wo Cm  06/28/2014   CLINICAL DATA:  Fall.  No loss of consciousness.  EXAM: CT HEAD WITHOUT CONTRAST  CT MAXILLOFACIAL WITHOUT CONTRAST  CT CERVICAL SPINE WITHOUT CONTRAST  TECHNIQUE: Multidetector CT imaging of the head, cervical spine, and maxillofacial structures were performed using the standard protocol without intravenous contrast. Multiplanar CT image reconstructions of the cervical spine and maxillofacial structures were also generated.  COMPARISON:  05/30/2011  FINDINGS: CT HEAD FINDINGS  No acute cortical infarct, hemorrhage, or mass lesion ispresent. Ventricles are of normal size. No significant extra-axial fluid collection is present. Mild mucosal thickening involves the left maxillary sinus. The mastoid air cells are clear. The osseous skull is intact.  CT MAXILLOFACIAL FINDINGS  Mucosal thickening involves the left maxillary sinus. No fluid levels are identified. The nasal bone is intact. There is mild leftward deviation of the nasal septum. The mandible is located and appears intact. The nasal bones appear intact.The parotid and submandibular glands appear normal. No mass identified.  CT CERVICAL SPINE FINDINGS  Straightening of normal cervical lordosis. There is disc space narrowing and ventral endplate spurring. This is most advanced at C5-6 and C6-7. Bilateral facet hypertrophy and degenerative change noted.  IMPRESSION: 1. No acute intracranial abnormalities. 2. No evidence for facial bone fracture. 3. Mild mucosal thickening involves the left maxillary sinus. 4. No evidence  for cervical spine fracture. 5. Cervical degenerative disc disease.   Electronically Signed   By: Kerby Moors M.D.   On: 06/28/2014 18:03    Hoy Morn, MD 06/29/14 [phone]

## 2014-09-19 ENCOUNTER — Encounter (HOSPITAL_BASED_OUTPATIENT_CLINIC_OR_DEPARTMENT_OTHER): Payer: Medicare Other

## 2014-12-20 ENCOUNTER — Other Ambulatory Visit: Payer: Self-pay | Admitting: Family Medicine

## 2014-12-20 DIAGNOSIS — N6453 Retraction of nipple: Secondary | ICD-10-CM

## 2014-12-20 DIAGNOSIS — E2839 Other primary ovarian failure: Secondary | ICD-10-CM

## 2014-12-27 ENCOUNTER — Other Ambulatory Visit: Payer: Self-pay

## 2014-12-29 ENCOUNTER — Ambulatory Visit
Admission: RE | Admit: 2014-12-29 | Discharge: 2014-12-29 | Disposition: A | Discharge status: Home / Self Care | Payer: Medicare Other | Source: Ambulatory Visit | Attending: Family Medicine | Admitting: Family Medicine

## 2014-12-29 ENCOUNTER — Other Ambulatory Visit: Payer: Self-pay | Admitting: Family Medicine

## 2014-12-29 DIAGNOSIS — R0602 Shortness of breath: Secondary | ICD-10-CM

## 2014-12-29 DIAGNOSIS — R05 Cough: Secondary | ICD-10-CM

## 2014-12-29 DIAGNOSIS — R062 Wheezing: Secondary | ICD-10-CM

## 2014-12-29 DIAGNOSIS — R059 Cough, unspecified: Secondary | ICD-10-CM

## 2015-01-02 ENCOUNTER — Ambulatory Visit
Admission: RE | Admit: 2015-01-02 | Discharge: 2015-01-02 | Disposition: A | Discharge status: Home / Self Care | Payer: Medicare Other | Source: Ambulatory Visit | Attending: Family Medicine | Admitting: Family Medicine

## 2015-01-02 DIAGNOSIS — N6453 Retraction of nipple: Secondary | ICD-10-CM

## 2015-01-02 DIAGNOSIS — E2839 Other primary ovarian failure: Secondary | ICD-10-CM

## 2015-01-06 ENCOUNTER — Encounter (HOSPITAL_COMMUNITY): Payer: Self-pay | Admitting: Emergency Medicine

## 2015-01-06 ENCOUNTER — Inpatient Hospital Stay (HOSPITAL_COMMUNITY)
Admission: EM | Admit: 2015-01-06 | Discharge: 2015-01-12 | DRG: 291 | Disposition: A | Discharge status: Home / Self Care | Payer: Medicare Other | Attending: Internal Medicine | Admitting: Internal Medicine

## 2015-01-06 ENCOUNTER — Emergency Department (HOSPITAL_COMMUNITY): Payer: Medicare Other

## 2015-01-06 DIAGNOSIS — I482 Chronic atrial fibrillation: Secondary | ICD-10-CM | POA: Diagnosis present

## 2015-01-06 DIAGNOSIS — R739 Hyperglycemia, unspecified: Secondary | ICD-10-CM

## 2015-01-06 DIAGNOSIS — Z96659 Presence of unspecified artificial knee joint: Secondary | ICD-10-CM | POA: Diagnosis present

## 2015-01-06 DIAGNOSIS — I10 Essential (primary) hypertension: Secondary | ICD-10-CM | POA: Diagnosis present

## 2015-01-06 DIAGNOSIS — Z6841 Body Mass Index (BMI) 40.0 and over, adult: Secondary | ICD-10-CM

## 2015-01-06 DIAGNOSIS — E119 Type 2 diabetes mellitus without complications: Secondary | ICD-10-CM | POA: Insufficient documentation

## 2015-01-06 DIAGNOSIS — J45901 Unspecified asthma with (acute) exacerbation: Secondary | ICD-10-CM | POA: Diagnosis present

## 2015-01-06 DIAGNOSIS — J209 Acute bronchitis, unspecified: Secondary | ICD-10-CM | POA: Diagnosis not present

## 2015-01-06 DIAGNOSIS — E039 Hypothyroidism, unspecified: Secondary | ICD-10-CM | POA: Diagnosis present

## 2015-01-06 DIAGNOSIS — J9801 Acute bronchospasm: Secondary | ICD-10-CM | POA: Diagnosis present

## 2015-01-06 DIAGNOSIS — N179 Acute kidney failure, unspecified: Secondary | ICD-10-CM | POA: Diagnosis present

## 2015-01-06 DIAGNOSIS — R0602 Shortness of breath: Secondary | ICD-10-CM

## 2015-01-06 DIAGNOSIS — I5031 Acute diastolic (congestive) heart failure: Secondary | ICD-10-CM | POA: Diagnosis present

## 2015-01-06 DIAGNOSIS — J45909 Unspecified asthma, uncomplicated: Secondary | ICD-10-CM | POA: Diagnosis present

## 2015-01-06 DIAGNOSIS — T502X5A Adverse effect of carbonic-anhydrase inhibitors, benzothiadiazides and other diuretics, initial encounter: Secondary | ICD-10-CM | POA: Diagnosis present

## 2015-01-06 DIAGNOSIS — E1165 Type 2 diabetes mellitus with hyperglycemia: Secondary | ICD-10-CM | POA: Diagnosis present

## 2015-01-06 DIAGNOSIS — Z7982 Long term (current) use of aspirin: Secondary | ICD-10-CM

## 2015-01-06 DIAGNOSIS — J9611 Chronic respiratory failure with hypoxia: Secondary | ICD-10-CM | POA: Insufficient documentation

## 2015-01-06 DIAGNOSIS — J441 Chronic obstructive pulmonary disease with (acute) exacerbation: Secondary | ICD-10-CM | POA: Diagnosis present

## 2015-01-06 DIAGNOSIS — Z794 Long term (current) use of insulin: Secondary | ICD-10-CM

## 2015-01-06 DIAGNOSIS — Z9049 Acquired absence of other specified parts of digestive tract: Secondary | ICD-10-CM | POA: Diagnosis present

## 2015-01-06 DIAGNOSIS — R0603 Acute respiratory distress: Secondary | ICD-10-CM | POA: Diagnosis present

## 2015-01-06 DIAGNOSIS — I480 Paroxysmal atrial fibrillation: Secondary | ICD-10-CM | POA: Diagnosis present

## 2015-01-06 DIAGNOSIS — E876 Hypokalemia: Secondary | ICD-10-CM | POA: Diagnosis present

## 2015-01-06 DIAGNOSIS — I5033 Acute on chronic diastolic (congestive) heart failure: Principal | ICD-10-CM | POA: Diagnosis present

## 2015-01-06 DIAGNOSIS — J80 Acute respiratory distress syndrome: Secondary | ICD-10-CM | POA: Diagnosis not present

## 2015-01-06 DIAGNOSIS — I4891 Unspecified atrial fibrillation: Secondary | ICD-10-CM | POA: Diagnosis present

## 2015-01-06 DIAGNOSIS — J9601 Acute respiratory failure with hypoxia: Secondary | ICD-10-CM | POA: Diagnosis present

## 2015-01-06 DIAGNOSIS — R0902 Hypoxemia: Secondary | ICD-10-CM

## 2015-01-06 DIAGNOSIS — Z90710 Acquired absence of both cervix and uterus: Secondary | ICD-10-CM

## 2015-01-06 DIAGNOSIS — Z885 Allergy status to narcotic agent status: Secondary | ICD-10-CM

## 2015-01-06 DIAGNOSIS — R0600 Dyspnea, unspecified: Secondary | ICD-10-CM | POA: Diagnosis present

## 2015-01-06 DIAGNOSIS — D6832 Hemorrhagic disorder due to extrinsic circulating anticoagulants: Secondary | ICD-10-CM | POA: Diagnosis present

## 2015-01-06 DIAGNOSIS — Z9981 Dependence on supplemental oxygen: Secondary | ICD-10-CM

## 2015-01-06 DIAGNOSIS — J069 Acute upper respiratory infection, unspecified: Secondary | ICD-10-CM | POA: Diagnosis present

## 2015-01-06 DIAGNOSIS — T45515A Adverse effect of anticoagulants, initial encounter: Secondary | ICD-10-CM | POA: Diagnosis present

## 2015-01-06 DIAGNOSIS — E785 Hyperlipidemia, unspecified: Secondary | ICD-10-CM | POA: Diagnosis present

## 2015-01-06 DIAGNOSIS — Z7901 Long term (current) use of anticoagulants: Secondary | ICD-10-CM

## 2015-01-06 DIAGNOSIS — K219 Gastro-esophageal reflux disease without esophagitis: Secondary | ICD-10-CM | POA: Diagnosis present

## 2015-01-06 HISTORY — DX: Unspecified atrial fibrillation: I48.91

## 2015-01-06 HISTORY — DX: Heart failure, unspecified: I50.9

## 2015-01-06 HISTORY — DX: Bronchitis, not specified as acute or chronic: J40

## 2015-01-06 LAB — BASIC METABOLIC PANEL
Anion gap: 7 (ref 5–15)
BUN: 18 mg/dL (ref 6–20)
CALCIUM: 8.3 mg/dL — AB (ref 8.9–10.3)
CO2: 29 mmol/L (ref 22–32)
Chloride: 98 mmol/L — ABNORMAL LOW (ref 101–111)
Creatinine, Ser: 1.08 mg/dL — ABNORMAL HIGH (ref 0.44–1.00)
GFR calc non Af Amer: 47 mL/min — ABNORMAL LOW (ref >60)
GFR, EST AFRICAN AMERICAN: 54 mL/min — AB (ref >60)
GLUCOSE: 227 mg/dL — AB (ref 70–99)
Potassium: 3 mmol/L — ABNORMAL LOW (ref 3.5–5.1)
Sodium: 134 mmol/L — ABNORMAL LOW (ref 135–145)

## 2015-01-06 LAB — CBC WITH DIFFERENTIAL/PLATELET
Basophils Absolute: 0.1 10*3/uL (ref 0.0–0.1)
Basophils Relative: 0 % (ref 0–1)
EOS PCT: 0 % (ref 0–5)
Eosinophils Absolute: 0 10*3/uL (ref 0.0–0.7)
HCT: 37.4 % (ref 36.0–46.0)
Hemoglobin: 12.3 g/dL (ref 12.0–15.0)
LYMPHS ABS: 1.2 10*3/uL (ref 0.7–4.0)
LYMPHS PCT: 11 % — AB (ref 12–46)
MCH: 31.9 pg (ref 26.0–34.0)
MCHC: 32.9 g/dL (ref 30.0–36.0)
MCV: 97.1 fL (ref 78.0–100.0)
MONOS PCT: 6 % (ref 3–12)
Monocytes Absolute: 0.7 10*3/uL (ref 0.1–1.0)
Neutro Abs: 9.7 10*3/uL — ABNORMAL HIGH (ref 1.7–7.7)
Neutrophils Relative %: 83 % — ABNORMAL HIGH (ref 43–77)
PLATELETS: 256 10*3/uL (ref 150–400)
RBC: 3.85 MIL/uL — AB (ref 3.87–5.11)
RDW: 13.2 % (ref 11.5–15.5)
WBC: 11.7 10*3/uL — AB (ref 4.0–10.5)

## 2015-01-06 LAB — TROPONIN I: Troponin I: <0.03 ng/mL (ref <0.031)

## 2015-01-06 LAB — BRAIN NATRIURETIC PEPTIDE: B NATRIURETIC PEPTIDE 5: 71.5 pg/mL (ref 0.0–100.0)

## 2015-01-06 MED ORDER — IPRATROPIUM-ALBUTEROL 0.5-2.5 (3) MG/3ML IN SOLN
3.0000 mL | Freq: Once | RESPIRATORY_TRACT | Status: AC
  Administered 2015-01-06: 3 mL via RESPIRATORY_TRACT
  Filled 2015-01-06: qty 3

## 2015-01-06 MED ORDER — POTASSIUM CHLORIDE CRYS ER 20 MEQ PO TBCR
40.0000 meq | EXTENDED_RELEASE_TABLET | Freq: Once | ORAL | Status: AC
  Administered 2015-01-06: 40 meq via ORAL
  Filled 2015-01-06: qty 2

## 2015-01-06 MED ORDER — POTASSIUM CHLORIDE CRYS ER 20 MEQ PO TBCR
60.0000 meq | EXTENDED_RELEASE_TABLET | Freq: Once | ORAL | Status: AC
  Administered 2015-01-07: 60 meq via ORAL
  Filled 2015-01-06: qty 3

## 2015-01-06 NOTE — ED Notes (Signed)
Dr Zavitz at bedside  

## 2015-01-06 NOTE — ED Notes (Signed)
O2 sat while ambulating on RA noted to drop to 87%.  Physician notified.

## 2015-01-06 NOTE — H&P (Signed)
[facility] Admission History and Physical       [name] Case XHB:[id number] DOB: [birth year] DOA: 01/06/2015  Referring physician: EDP PCP: Dr.  Claris Gower Specialists:   Chief Complaint: Worsening SOB and Wheezing  HPI: [name] Case is a 81 y.o. female who presents to the ED after having an episode of respiratory distress in her home after eating with her family for their Mother's day celebration.  Her lips and hands began to turn blue, and she was unable to lay flat because she could not breathe, EMS was called, and she was placed on NCO2 and on route she was observed to have wheezing and was administered 1 dose of IV Solumedrol.     She has had increased SOB and Coughing and Wheezing for over 4 weeks, Her Cough has been non-Productive.  She has been treated by her PCP initially for Acute Bronchitis with antibiotics and several rounds of prednisone tapers.    She was sent for imaging to [place] and was told that she had Congestive Heart Failure, and then had followup with her  PCP and was diagnosed on Monday 01/01/2015 with Atrial fibrillation and started on coumadin as well as Furosemide, and ? Metoprolol per the daughter's report.  She continued to have SOB, DOE and Orthopnea as well as wheezing for the next week.    Review of Systems:  Constitutional: No Weight Loss, No Weight Gain, Night Sweats, Fevers, Chills, Dizziness, Light Headedness, Fatigue, or Generalized Weakness HEENT: No Headaches, Difficulty Swallowing,Tooth/Dental Problems,Sore Throat,  No Sneezing, Rhinitis, Ear Ache, Nasal Congestion, or Post Nasal Drip,  Cardio-vascular:  No Chest pain, +Orthopnea, PND, Edema in Lower Extremities, Anasarca, Dizziness, +Palpitations  Resp: +Dyspnea, +DOE, No Productive Cough, +Non-Productive Cough, No Hemoptysis, +Wheezing.    GI: No Heartburn, Indigestion, Abdominal Pain, Nausea, Vomiting, Diarrhea, Constipation, Hematemesis, Hematochezia, Melena, Change in  Bowel Habits,  Loss of Appetite  GU: No Dysuria, No Change in Color of Urine, No Urgency or Urinary Frequency, No Flank pain.  Musculoskeletal: No Joint Pain or Swelling, No Decreased Range of Motion, No Back Pain.  Neurologic: No Syncope, No Seizures, Muscle Weakness, Paresthesia, Vision Disturbance or Loss, No Diplopia, No Vertigo, No Difficulty Walking,  Skin: No Rash or Lesions. Psych: No Change in Mood or Affect, No Depression or Anxiety, No Memory loss, No Confusion, or Hallucinations   Past Medical History  Diagnosis Date  . Allergy   . Asthma   . Hyperlipidemia   . Hypertension   . Thyroid disease   . GERD (gastroesophageal reflux disease)   . CHF (congestive heart failure)   . Bronchitis   . Atrial fibrillation      Past Surgical History  Procedure Laterality Date  . Appendectomy    . Cholecystectomy    . Abdominal hysterectomy    . Total knee arthroplasty        Prior to Admission medications   Medication Sig Start Date End Date Taking? Authorizing Provider  albuterol (PROVENTIL HFA;VENTOLIN HFA) 108 (90 BASE) MCG/ACT inhaler Inhale 2 puffs into the lungs every 4 (four) hours as needed for wheezing or shortness of breath. 12/04/13  Yes Harden Mo, MD  amLODipine (NORVASC) 5 MG tablet Take 1 tablet (5 mg total) by mouth every morning. 08/19/12  Yes Marletta Lor, MD  benazepril (LOTENSIN) 20 MG tablet Take 1 tablet (20 mg total) by mouth daily. 08/19/12  Yes Marletta Lor, MD  furosemide (LASIX) 80 MG tablet Take 80 mg by  mouth daily. 01/01/15  Yes Historical Provider, MD  levothyroxine (SYNTHROID, LEVOTHROID) 100 MCG tablet Take 1 tablet (100 mcg total) by mouth daily. 08/19/12  Yes Marletta Lor, MD  metoprolol succinate (TOPROL-XL) 25 MG 24 hr tablet Take 25 mg by mouth daily. 01/01/15  Yes Historical Provider, MD  warfarin (COUMADIN) 5 MG tablet Take 2.5 mg by mouth daily. 01/01/15  Yes Historical Provider, MD  albuterol (PROVENTIL HFA;VENTOLIN HFA)  108 (90 BASE) MCG/ACT inhaler Inhale 1-2 puffs into the lungs every 6 (six) hours as needed for wheezing. 08/19/12 08/19/13  Marletta Lor, MD  albuterol (PROVENTIL) (2.5 MG/3ML) 0.083% nebulizer solution Take 3 mLs (2.5 mg total) by nebulization every 4 (four) hours as needed for wheezing. Patient not taking: Reported on 01/06/2015 12/04/13   Harden Mo, MD  furosemide (LASIX) 40 MG tablet Take 1 tablet (40 mg total) by mouth daily. May take an additional evening dose if legs are swelling Patient not taking: Reported on 01/06/2015 08/19/12   Marletta Lor, MD  HYDROcodone-acetaminophen (NORCO/VICODIN) 5-325 MG per tablet Take 1-2 tablets by mouth every 6 hours as needed for pain. Patient not taking: Reported on 01/06/2015 06/28/14   Elmyra Ricks Pisciotta, PA-C  levalbuterol [facility] HFA) 45 MCG/ACT inhaler Inhale 1-2 puffs into the lungs every 4 (four) hours as needed for wheezing. Patient not taking: Reported on 01/06/2015 07/03/12   Shanon Rosser, MD     Allergies  Allergen Reactions  . Codeine Hives and Rash    In cough syrup preparations    Social History:    reports that she has never smoked. She has never used smokeless tobacco. She reports that she does not drink alcohol or use illicit drugs.    Family History  Problem Relation Age of Onset  . Cancer Mother     colon  . Heart disease Mother       Physical Exam:  GEN:  Pleasant Obese Elderly  81 y.o. Caucasian female examined and in mild distress; cooperative with exam Filed Vitals:   01/06/15 2230 01/06/15 2245 01/06/15 2300 01/06/15 2315  BP: 111/42 105/44 95/30 100/77  Pulse: 47 66 66 55  Temp:      TempSrc:      Resp: 26 17 22 25   Height:      Weight:      SpO2: 94% 93% 96% 97%   Blood pressure 100/77, pulse 55, temperature 98.3 F (36.8 C), temperature source Oral, resp. rate 25, height 5\' 1"  (1.549 m), weight 99.791 kg (220 lb), SpO2 97 %. PSYCH: She is alert and oriented x4; does not appear anxious does not  appear depressed; affect is normal HEENT: Normocephalic and Atraumatic, Mucous membranes pink; PERRLA; EOM intact; Fundi:  Benign;  No scleral icterus, Nares: Patent, Oropharynx: Clear, Edentulous with Dentures,    Neck:  FROM, No Cervical Lymphadenopathy nor Thyromegaly or Carotid Bruit; No JVD; Breasts:: Not examined CHEST WALL: No tenderness CHEST: Decreased Breath Sounds Diffuse Expiratory Wheezes,    HEART: Regular Rate and Rhythm; no murmurs rubs or gallops BACK: No kyphosis or scoliosis; No CVA tenderness ABDOMEN: Positive Bowel Sounds, Obese, Soft Non-Tender, No Rebound or Guarding; No Masses, No Organomegaly Rectal Exam: Not done EXTREMITIES: No Cyanosis, Clubbing, or Edema; No Ulcerations. Genitalia: not examined PULSES: 2+ and symmetric SKIN: Normal hydration no rash or ulceration CNS:  Alert and Oriented x 4, No Focal Deficits Vascular: pulses palpable throughout    Labs on Admission:  Basic Metabolic Panel:  Recent Labs Lab  01/06/15 2058  NA 134*  K 3.0*  CL 98*  CO2 29  GLUCOSE 227*  BUN 18  CREATININE 1.08*  CALCIUM 8.3*   Liver Function Tests: No results for input(s): AST, ALT, ALKPHOS, BILITOT, PROT, ALBUMIN in the last 168 hours. No results for input(s): LIPASE, AMYLASE in the last 168 hours. No results for input(s): AMMONIA in the last 168 hours. CBC:  Recent Labs Lab 01/06/15 2058  WBC 11.7*  NEUTROABS 9.7*  HGB 12.3  HCT 37.4  MCV 97.1  PLT 256   Cardiac Enzymes:  Recent Labs Lab 01/06/15 2058  TROPONINI <0.03    BNP (last 3 results)  Recent Labs  01/06/15 2058  BNP 71.5    ProBNP (last 3 results) No results for input(s): PROBNP in the last 8760 hours.  CBG: No results for input(s): GLUCAP in the last 168 hours.  Radiological Exams on Admission: Dg Chest 2 View  01/06/2015   CLINICAL DATA:  Productive cough and shortness of Breath  EXAM: CHEST  2 VIEW  COMPARISON:  12/29/2014  FINDINGS: Cardiac shadow is mildly enlarged but  stable. The lungs are well aerated bilaterally. No focal infiltrate or sizable effusion is seen. No acute bony abnormality is noted.  IMPRESSION: No active cardiopulmonary disease.   Electronically Signed   By: Inez Catalina M.D.   On: 01/06/2015 21:49     EKG: Independently reviewed.  Sinus Rhythm with Bigeminy Rate =95,    Assessment/Plan:   81 y.o. female with  Principal Problem:   1.   Acute respiratory distress/ Dyspnea- Differential Dx Acute CHF with Cardiogenic Wheezing,vs URI with Bronchospasm, versus Asthma Exacerbation   Telemetry Monitoring   NCO2 PRN titrate PRN   Monitor O2 Sats   Check D-Dimer   Active Problems:   2.   Asthma   DUONEBs   O2   IV Steroid Taper     3.  Acute diastolic CHF (congestive heart failure)   Acute CHF Protocol- Diurese with IV Lasix   Strict I/Os   Continue Metoprolol RX,  Benazepril on Hold due to Cough    2D ECHO in AM     4.  Bronchospasm- due to #1, vs Due to Ace Inhibitor Rx   Discontinue Benazepril for now   Monitor for improvement      5.   Acute upper respiratory infection   CXR - Negative for Pneumonia     6.   Hyperglycemia- due to Steroid Rx   SSI coverage PRN   Check HbA1C     7.  Atrial fibrillation   Continue Coumadin Rx Adjust PRN   Continue Metorprolol Rx     8.   Essential hypertension   Continue Metoprolol, Furosemide,     and Amlodipine Rx as BP Tolerates   Monitor BPs     9.   Hyperlipidemia   Not on Cholesterol meds currently   Check fasting Lipids   10.   Hypothyroidism   Continue Levothyroxine Rx   Check TSH   11.   Warfarin-induced coagulopathy   Continue Coumadin   Adjust PRN   Monitor PT/INRs    12.  DVT Prophylaxis   On Coumadin         Code Status:     FULL CODE       Family Communication:   Family at Bedside     Disposition Plan:    Inpatient Status        Time spent:  63 Minutes  Theressa Millard [facility] Pager [phone]   If [place] Please Contact  the Day Rounding Team MD for [facility]  If 7PM-7AM, Please Contact Night-Floor Coverage  [email or web address] Password [email or web address] 01/06/2015, 11:54 PM     ADDENDUM:   Patient was seen and examined on 01/06/2015

## 2015-01-06 NOTE — Progress Notes (Signed)
Report received 

## 2015-01-06 NOTE — ED Provider Notes (Addendum)
CSN: [id number]     Arrival date & time 01/06/15  2004 History   First MD Initiated Contact with Patient 01/06/15 2044     Chief Complaint  Patient presents with  . Shortness of Breath     (Consider location/radiation/quality/duration/timing/severity/associated sxs/prior Treatment) HPI Comments: 81 year old female with history of lipids, obesity, high blood pressure, reflux presents with worsening shortness of breath partially exertional and with lying flat. Patient has had recurrent cough and shortness of breath for the past 4 weeks. Patient is diagnosed bronchitis and received multiple rounds of steroids and antibiotics without improvement. Patient was recently seen at urgent care and told likely early heart failure. Patient is placed on Lasix and metoprolol however does not feel she is improving. No classic blood clot risk factors. Nonsmoker.  Patient is a 81 y.o. female presenting with shortness of breath. The history is provided by the patient.  Shortness of Breath Associated symptoms: cough   Associated symptoms: no abdominal pain, no chest pain, no fever, no headaches, no neck pain, no rash and no vomiting     Past Medical History  Diagnosis Date  . Allergy   . Asthma   . Hyperlipidemia   . Hypertension   . Thyroid disease   . GERD (gastroesophageal reflux disease)   . CHF (congestive heart failure)   . Bronchitis    Past Surgical History  Procedure Laterality Date  . Appendectomy    . Cholecystectomy    . Abdominal hysterectomy    . Total knee arthroplasty     Family History  Problem Relation Age of Onset  . Cancer Mother     colon  . Heart disease Mother    History  Substance Use Topics  . Smoking status: Never Smoker   . Smokeless tobacco: Never Used  . Alcohol Use: No   OB History    No data available     Review of Systems  Constitutional: Negative for fever, chills and unexpected weight change.  HENT: Negative for congestion.   Eyes: Negative for  visual disturbance.  Respiratory: Positive for cough and shortness of breath.   Cardiovascular: Negative for chest pain.  Gastrointestinal: Negative for vomiting and abdominal pain.  Genitourinary: Negative for dysuria and flank pain.  Musculoskeletal: Negative for back pain, neck pain and neck stiffness.  Skin: Negative for rash.  Neurological: Negative for light-headedness and headaches.      Allergies  Codeine  Home Medications   Prior to Admission medications   Medication Sig Start Date End Date Taking? Authorizing Provider  albuterol (PROVENTIL HFA;VENTOLIN HFA) 108 (90 BASE) MCG/ACT inhaler Inhale 2 puffs into the lungs every 4 (four) hours as needed for wheezing or shortness of breath. 12/04/13  Yes Harden Mo, MD  amLODipine (NORVASC) 5 MG tablet Take 1 tablet (5 mg total) by mouth every morning. 08/19/12  Yes Marletta Lor, MD  benazepril (LOTENSIN) 20 MG tablet Take 1 tablet (20 mg total) by mouth daily. 08/19/12  Yes Marletta Lor, MD  furosemide (LASIX) 80 MG tablet Take 80 mg by mouth daily. 01/01/15  Yes Historical Provider, MD  levothyroxine (SYNTHROID, LEVOTHROID) 100 MCG tablet Take 1 tablet (100 mcg total) by mouth daily. 08/19/12  Yes Marletta Lor, MD  metoprolol succinate (TOPROL-XL) 25 MG 24 hr tablet Take 25 mg by mouth daily. 01/01/15  Yes Historical Provider, MD  warfarin (COUMADIN) 5 MG tablet Take 2.5 mg by mouth daily. 01/01/15  Yes Historical Provider, MD  albuterol (PROVENTIL HFA;VENTOLIN  HFA) 108 (90 BASE) MCG/ACT inhaler Inhale 1-2 puffs into the lungs every 6 (six) hours as needed for wheezing. 08/19/12 08/19/13  Marletta Lor, MD  albuterol (PROVENTIL) (2.5 MG/3ML) 0.083% nebulizer solution Take 3 mLs (2.5 mg total) by nebulization every 4 (four) hours as needed for wheezing. Patient not taking: Reported on 01/06/2015 12/04/13   Harden Mo, MD  furosemide (LASIX) 40 MG tablet Take 1 tablet (40 mg total) by mouth daily. May take an  additional evening dose if legs are swelling Patient not taking: Reported on 01/06/2015 08/19/12   Marletta Lor, MD  HYDROcodone-acetaminophen (NORCO/VICODIN) 5-325 MG per tablet Take 1-2 tablets by mouth every 6 hours as needed for pain. Patient not taking: Reported on 01/06/2015 06/28/14   Elmyra Ricks Pisciotta, PA-C  levalbuterol [facility] HFA) 45 MCG/ACT inhaler Inhale 1-2 puffs into the lungs every 4 (four) hours as needed for wheezing. Patient not taking: Reported on 01/06/2015 07/03/12   John Molpus, MD   BP 100/77 mmHg  Pulse 55  Temp(Src) 98.3 F (36.8 C) (Oral)  Resp 25  Ht 5\' 1"  (1.549 m)  Wt 220 lb (99.791 kg)  BMI 41.59 kg/m2  SpO2 97% Physical Exam  Constitutional: She is oriented to person, place, and time. She appears well-developed and well-nourished.  HENT:  Head: Normocephalic and atraumatic.  Eyes: Conjunctivae are normal. Right eye exhibits no discharge. Left eye exhibits no discharge.  Neck: Normal range of motion. Neck supple. No tracheal deviation present.  Cardiovascular: Normal rate and regular rhythm.   Pulmonary/Chest: Effort normal. She has wheezes (mild end expiratory bilateral).  Abdominal: Soft. She exhibits no distension. There is no tenderness. There is no guarding.  Musculoskeletal: She exhibits no edema or tenderness.  Neurological: She is alert and oriented to person, place, and time.  Skin: Skin is warm. No rash noted.  Psychiatric: She has a normal mood and affect.  Nursing note and vitals reviewed.   ED Course  Procedures (including critical care time) Labs Review Labs Reviewed  BASIC METABOLIC PANEL - Abnormal; Notable for the following:    Sodium 134 (*)    Potassium 3.0 (*)    Chloride 98 (*)    Glucose, Bld 227 (*)    Creatinine, Ser 1.08 (*)    Calcium 8.3 (*)    GFR calc non Af Amer 47 (*)    GFR calc Af Amer 54 (*)    All other components within normal limits  CBC WITH DIFFERENTIAL/PLATELET - Abnormal; Notable for the following:     WBC 11.7 (*)    RBC 3.85 (*)    Neutrophils Relative % 83 (*)    Neutro Abs 9.7 (*)    Lymphocytes Relative 11 (*)    All other components within normal limits  BRAIN NATRIURETIC PEPTIDE  TROPONIN I    Imaging Review Dg Chest 2 View  01/06/2015   CLINICAL DATA:  Productive cough and shortness of Breath  EXAM: CHEST  2 VIEW  COMPARISON:  12/29/2014  FINDINGS: Cardiac shadow is mildly enlarged but stable. The lungs are well aerated bilaterally. No focal infiltrate or sizable effusion is seen. No acute bony abnormality is noted.  IMPRESSION: No active cardiopulmonary disease.   Electronically Signed   By: Inez Catalina M.D.   On: 01/06/2015 21:49     EKG Interpretation   Date/Time:  Saturday Jan 06 2015 22:05:45 EDT Ventricular Rate:  95 PR Interval:  155 QRS Duration: 99 QT Interval:  400 QTC Calculation: 503  R Axis:   36 Text Interpretation:  Sinus rhythm Supraventricular bigeminy Low voltage,  precordial leads Nonspecific T abnormalities, lateral leads similar  previous Confirmed by Kaliel Bolds  MD, Lakyla Biswas ([id number]) on 01/06/2015 11:12:24 PM      MDM   Final diagnoses:  Hypoxia  Dyspnea  Hypokalemia  Hyperglycemia   Patient presents with mixed picture shortness of breath. Recently told mild congestive heart failure and placed on medications. On exam patient has mild end expiratory wheeze and chest x-ray completed with no acute findings. Patient requiring 2 L nasal cannula as she drops to 87% with ambulation. Patient received steroids and nebulizer prior to arrival and feels improved. Discussed with chart hospitalist for further workup of dyspnea. No classic low-cut risk factors.  The patients results and plan were reviewed and discussed.   Any x-rays performed were personally reviewed by myself.   Differential diagnosis were considered with the presenting HPI.  Medications  ipratropium-albuterol (DUONEB) 0.5-2.5 (3) MG/3ML nebulizer solution 3 mL (3 mLs Nebulization Given  01/06/15 2338)  potassium chloride SA (K-DUR,KLOR-CON) CR tablet 40 mEq (40 mEq Oral Given 01/06/15 2338)    Filed Vitals:   01/06/15 2230 01/06/15 2245 01/06/15 2300 01/06/15 2315  BP: 111/42 105/44 95/30 100/77  Pulse: 47 66 66 55  Temp:      TempSrc:      Resp: 26 17 22 25   Height:      Weight:      SpO2: 94% 93% 96% 97%    Final diagnoses:  Hypoxia  Dyspnea  Hypokalemia  Hyperglycemia    Admission/ observation were discussed with the admitting physician, patient and/or family and they are comfortable with the plan.     Elnora Morrison, MD 01/06/15 [id number]  Elnora Morrison, MD 01/06/15 [phone]

## 2015-01-06 NOTE — ED Notes (Signed)
Been having Sob with congestion and cough for 4 weeks.  Diagnosed with Bronchitis  4 weeks ago.  Seen again a week and half ago and diagnosed with CHF.  Given albuterol 5 and atrovent 0.5, and solumedrol 125mg  enroute.

## 2015-01-06 NOTE — ED Notes (Signed)
In room to give Potassium per order.  Hospitalist in the room at this time.  Patient turned on her side coughing at this time.  Duoneb given per md request.  To take to the floor after treatment.

## 2015-01-06 NOTE — ED Notes (Signed)
Dr. Jenkins at bedside. 

## 2015-01-07 ENCOUNTER — Encounter (HOSPITAL_COMMUNITY): Payer: Self-pay | Admitting: General Practice

## 2015-01-07 DIAGNOSIS — R0600 Dyspnea, unspecified: Secondary | ICD-10-CM | POA: Diagnosis not present

## 2015-01-07 DIAGNOSIS — T502X5A Adverse effect of carbonic-anhydrase inhibitors, benzothiadiazides and other diuretics, initial encounter: Secondary | ICD-10-CM | POA: Diagnosis present

## 2015-01-07 DIAGNOSIS — J45901 Unspecified asthma with (acute) exacerbation: Secondary | ICD-10-CM | POA: Diagnosis present

## 2015-01-07 DIAGNOSIS — E039 Hypothyroidism, unspecified: Secondary | ICD-10-CM | POA: Diagnosis present

## 2015-01-07 DIAGNOSIS — I5031 Acute diastolic (congestive) heart failure: Secondary | ICD-10-CM

## 2015-01-07 DIAGNOSIS — Z96659 Presence of unspecified artificial knee joint: Secondary | ICD-10-CM | POA: Diagnosis present

## 2015-01-07 DIAGNOSIS — I480 Paroxysmal atrial fibrillation: Secondary | ICD-10-CM | POA: Diagnosis not present

## 2015-01-07 DIAGNOSIS — Z885 Allergy status to narcotic agent status: Secondary | ICD-10-CM | POA: Diagnosis not present

## 2015-01-07 DIAGNOSIS — Z90710 Acquired absence of both cervix and uterus: Secondary | ICD-10-CM | POA: Diagnosis not present

## 2015-01-07 DIAGNOSIS — Z7901 Long term (current) use of anticoagulants: Secondary | ICD-10-CM | POA: Diagnosis not present

## 2015-01-07 DIAGNOSIS — N179 Acute kidney failure, unspecified: Secondary | ICD-10-CM | POA: Diagnosis not present

## 2015-01-07 DIAGNOSIS — Z9049 Acquired absence of other specified parts of digestive tract: Secondary | ICD-10-CM | POA: Diagnosis present

## 2015-01-07 DIAGNOSIS — E876 Hypokalemia: Secondary | ICD-10-CM | POA: Diagnosis present

## 2015-01-07 DIAGNOSIS — J209 Acute bronchitis, unspecified: Secondary | ICD-10-CM | POA: Diagnosis present

## 2015-01-07 DIAGNOSIS — Z7982 Long term (current) use of aspirin: Secondary | ICD-10-CM | POA: Diagnosis not present

## 2015-01-07 DIAGNOSIS — I10 Essential (primary) hypertension: Secondary | ICD-10-CM | POA: Diagnosis not present

## 2015-01-07 DIAGNOSIS — E785 Hyperlipidemia, unspecified: Secondary | ICD-10-CM | POA: Diagnosis present

## 2015-01-07 DIAGNOSIS — J9601 Acute respiratory failure with hypoxia: Secondary | ICD-10-CM | POA: Diagnosis present

## 2015-01-07 DIAGNOSIS — I482 Chronic atrial fibrillation: Secondary | ICD-10-CM | POA: Diagnosis not present

## 2015-01-07 DIAGNOSIS — T45515A Adverse effect of anticoagulants, initial encounter: Secondary | ICD-10-CM | POA: Diagnosis present

## 2015-01-07 DIAGNOSIS — J80 Acute respiratory distress syndrome: Secondary | ICD-10-CM | POA: Diagnosis not present

## 2015-01-07 DIAGNOSIS — Z6841 Body Mass Index (BMI) 40.0 and over, adult: Secondary | ICD-10-CM | POA: Diagnosis not present

## 2015-01-07 DIAGNOSIS — I5033 Acute on chronic diastolic (congestive) heart failure: Secondary | ICD-10-CM | POA: Diagnosis present

## 2015-01-07 DIAGNOSIS — J441 Chronic obstructive pulmonary disease with (acute) exacerbation: Secondary | ICD-10-CM | POA: Diagnosis present

## 2015-01-07 DIAGNOSIS — J069 Acute upper respiratory infection, unspecified: Secondary | ICD-10-CM | POA: Diagnosis present

## 2015-01-07 DIAGNOSIS — K219 Gastro-esophageal reflux disease without esophagitis: Secondary | ICD-10-CM | POA: Diagnosis present

## 2015-01-07 DIAGNOSIS — Z9981 Dependence on supplemental oxygen: Secondary | ICD-10-CM | POA: Diagnosis not present

## 2015-01-07 DIAGNOSIS — E1165 Type 2 diabetes mellitus with hyperglycemia: Secondary | ICD-10-CM | POA: Diagnosis present

## 2015-01-07 DIAGNOSIS — Z794 Long term (current) use of insulin: Secondary | ICD-10-CM | POA: Diagnosis not present

## 2015-01-07 DIAGNOSIS — R0602 Shortness of breath: Secondary | ICD-10-CM | POA: Diagnosis present

## 2015-01-07 LAB — CBC
HCT: 37 % (ref 36.0–46.0)
Hemoglobin: 12.2 g/dL (ref 12.0–15.0)
MCH: 31.9 pg (ref 26.0–34.0)
MCHC: 33 g/dL (ref 30.0–36.0)
MCV: 96.9 fL (ref 78.0–100.0)
PLATELETS: 254 10*3/uL (ref 150–400)
RBC: 3.82 MIL/uL — ABNORMAL LOW (ref 3.87–5.11)
RDW: 13.1 % (ref 11.5–15.5)
WBC: 11.3 10*3/uL — AB (ref 4.0–10.5)

## 2015-01-07 LAB — PROTIME-INR
INR: 1.05 (ref 0.00–1.49)
PROTHROMBIN TIME: 13.8 s (ref 11.6–15.2)

## 2015-01-07 LAB — BASIC METABOLIC PANEL
Anion gap: 11 (ref 5–15)
BUN: 21 mg/dL — ABNORMAL HIGH (ref 6–20)
CALCIUM: 8.6 mg/dL — AB (ref 8.9–10.3)
CO2: 29 mmol/L (ref 22–32)
CREATININE: 1.11 mg/dL — AB (ref 0.44–1.00)
Chloride: 99 mmol/L — ABNORMAL LOW (ref 101–111)
GFR calc Af Amer: 53 mL/min — ABNORMAL LOW (ref >60)
GFR, EST NON AFRICAN AMERICAN: 45 mL/min — AB (ref >60)
Glucose, Bld: 288 mg/dL — ABNORMAL HIGH (ref 70–99)
Potassium: 3.6 mmol/L (ref 3.5–5.1)
SODIUM: 139 mmol/L (ref 135–145)

## 2015-01-07 LAB — APTT: aPTT: 31 seconds (ref 24–37)

## 2015-01-07 LAB — GLUCOSE, CAPILLARY
GLUCOSE-CAPILLARY: 174 mg/dL — AB (ref 70–99)
Glucose-Capillary: 238 mg/dL — ABNORMAL HIGH (ref 70–99)
Glucose-Capillary: 188 mg/dL — ABNORMAL HIGH (ref 70–99)
Glucose-Capillary: 118 mg/dL — ABNORMAL HIGH (ref 70–99)

## 2015-01-07 LAB — TSH: TSH: 1.09 u[IU]/mL (ref 0.350–4.500)

## 2015-01-07 LAB — D-DIMER, QUANTITATIVE: D-Dimer, Quant: 0.41 ug/mL-FEU (ref 0.00–0.48)

## 2015-01-07 LAB — MAGNESIUM: Magnesium: 1.8 mg/dL (ref 1.7–2.4)

## 2015-01-07 MED ORDER — WARFARIN SODIUM 2.5 MG PO TABS: 2.5000 mg | ORAL_TABLET | Freq: Every day | ORAL | Status: DC

## 2015-01-07 MED ORDER — FUROSEMIDE 10 MG/ML IJ SOLN
80.0000 mg | Freq: Two times a day (BID) | INTRAMUSCULAR | Status: AC
  Administered 2015-01-07 – 2015-01-08 (×4): 80 mg via INTRAVENOUS
  Filled 2015-01-07 (×5): qty 8

## 2015-01-07 MED ORDER — METOPROLOL SUCCINATE ER 25 MG PO TB24
25.0000 mg | ORAL_TABLET | Freq: Every day | ORAL | Status: DC
  Administered 2015-01-07 – 2015-01-08 (×2): 25 mg via ORAL
  Filled 2015-01-07 (×2): qty 1

## 2015-01-07 MED ORDER — POTASSIUM CHLORIDE CRYS ER 20 MEQ PO TBCR
20.0000 meq | EXTENDED_RELEASE_TABLET | Freq: Two times a day (BID) | ORAL | Status: AC
  Administered 2015-01-07 – 2015-01-08 (×3): 20 meq via ORAL
  Filled 2015-01-07 (×4): qty 1

## 2015-01-07 MED ORDER — IPRATROPIUM-ALBUTEROL 0.5-2.5 (3) MG/3ML IN SOLN
3.0000 mL | RESPIRATORY_TRACT | Status: DC
  Administered 2015-01-07: 3 mL via RESPIRATORY_TRACT
  Filled 2015-01-07: qty 3

## 2015-01-07 MED ORDER — SODIUM CHLORIDE 0.9 % IJ SOLN: 3.0000 mL | INTRAMUSCULAR | Status: DC | PRN

## 2015-01-07 MED ORDER — ENOXAPARIN SODIUM 100 MG/ML ~~LOC~~ SOLN
1.0000 mg/kg | Freq: Two times a day (BID) | SUBCUTANEOUS | Status: DC
  Administered 2015-01-08 – 2015-01-12 (×9): 100 mg via SUBCUTANEOUS
  Filled 2015-01-07 (×12): qty 1

## 2015-01-07 MED ORDER — MORPHINE SULFATE 2 MG/ML IJ SOLN: 1.0000 mg | INTRAMUSCULAR | Status: DC | PRN

## 2015-01-07 MED ORDER — SODIUM CHLORIDE 0.9 % IJ SOLN
3.0000 mL | Freq: Two times a day (BID) | INTRAMUSCULAR | Status: DC
  Administered 2015-01-07 – 2015-01-08 (×3): 3 mL via INTRAVENOUS
  Administered 2015-01-08: 10 mL via INTRAVENOUS
  Administered 2015-01-09 – 2015-01-11 (×5): 3 mL via INTRAVENOUS

## 2015-01-07 MED ORDER — METOPROLOL SUCCINATE ER 25 MG PO TB24
25.0000 mg | ORAL_TABLET | Freq: Every day | ORAL | Status: DC
  Filled 2015-01-07: qty 1

## 2015-01-07 MED ORDER — SODIUM CHLORIDE 0.9 % IJ SOLN: 3.0000 mL | Freq: Two times a day (BID) | INTRAMUSCULAR | Status: DC

## 2015-01-07 MED ORDER — SODIUM CHLORIDE 0.9 % IV SOLN: 250.0000 mL | INTRAVENOUS | Status: DC | PRN

## 2015-01-07 MED ORDER — IPRATROPIUM-ALBUTEROL 0.5-2.5 (3) MG/3ML IN SOLN
3.0000 mL | Freq: Two times a day (BID) | RESPIRATORY_TRACT | Status: DC
  Administered 2015-01-07 – 2015-01-10 (×6): 3 mL via RESPIRATORY_TRACT
  Filled 2015-01-07 (×6): qty 3

## 2015-01-07 MED ORDER — INSULIN ASPART 100 UNIT/ML ~~LOC~~ SOLN
0.0000 [IU] | Freq: Three times a day (TID) | SUBCUTANEOUS | Status: DC
  Administered 2015-01-07: 3 [IU] via SUBCUTANEOUS
  Administered 2015-01-07: 2 [IU] via SUBCUTANEOUS
  Administered 2015-01-09: 1 [IU] via SUBCUTANEOUS
  Administered 2015-01-09: 2 [IU] via SUBCUTANEOUS

## 2015-01-07 MED ORDER — ASPIRIN EC 325 MG PO TBEC
325.0000 mg | DELAYED_RELEASE_TABLET | Freq: Every day | ORAL | Status: DC
  Administered 2015-01-08 – 2015-01-09 (×2): 325 mg via ORAL
  Filled 2015-01-07 (×3): qty 1

## 2015-01-07 MED ORDER — IPRATROPIUM-ALBUTEROL 0.5-2.5 (3) MG/3ML IN SOLN
RESPIRATORY_TRACT | Status: AC
  Administered 2015-01-07: 09:00:00
  Filled 2015-01-07: qty 3

## 2015-01-07 MED ORDER — LEVOTHYROXINE SODIUM 100 MCG PO TABS
100.0000 ug | ORAL_TABLET | Freq: Every day | ORAL | Status: DC
  Administered 2015-01-07 – 2015-01-12 (×6): 100 ug via ORAL
  Filled 2015-01-07 (×7): qty 1

## 2015-01-07 MED ORDER — WARFARIN - PHARMACIST DOSING INPATIENT: Freq: Every day | Status: DC

## 2015-01-07 MED ORDER — ONDANSETRON HCL 4 MG PO TABS: 4.0000 mg | ORAL_TABLET | Freq: Four times a day (QID) | ORAL | Status: DC | PRN

## 2015-01-07 MED ORDER — ENOXAPARIN SODIUM 100 MG/ML ~~LOC~~ SOLN
1.0000 mg/kg | Freq: Two times a day (BID) | SUBCUTANEOUS | Status: DC
  Filled 2015-01-07 (×2): qty 1

## 2015-01-07 MED ORDER — GUAIFENESIN ER 600 MG PO TB12
600.0000 mg | ORAL_TABLET | Freq: Two times a day (BID) | ORAL | Status: DC | PRN
  Administered 2015-01-07 – 2015-01-10 (×4): 600 mg via ORAL
  Filled 2015-01-07 (×6): qty 1

## 2015-01-07 MED ORDER — AMLODIPINE BESYLATE 5 MG PO TABS
5.0000 mg | ORAL_TABLET | Freq: Every day | ORAL | Status: DC
  Administered 2015-01-07 – 2015-01-08 (×2): 5 mg via ORAL
  Filled 2015-01-07 (×2): qty 1

## 2015-01-07 MED ORDER — INSULIN ASPART 100 UNIT/ML ~~LOC~~ SOLN: 0.0000 [IU] | Freq: Every day | SUBCUTANEOUS | Status: DC

## 2015-01-07 MED ORDER — ACETAMINOPHEN 325 MG PO TABS: 650.0000 mg | ORAL_TABLET | Freq: Four times a day (QID) | ORAL | Status: DC | PRN

## 2015-01-07 MED ORDER — ONDANSETRON HCL 4 MG/2ML IJ SOLN: 4.0000 mg | Freq: Four times a day (QID) | INTRAMUSCULAR | Status: DC | PRN

## 2015-01-07 MED ORDER — HEPARIN SODIUM (PORCINE) 5000 UNIT/ML IJ SOLN
5000.0000 [IU] | Freq: Three times a day (TID) | INTRAMUSCULAR | Status: DC
  Administered 2015-01-07: 5000 [IU] via SUBCUTANEOUS
  Filled 2015-01-07 (×4): qty 1

## 2015-01-07 MED ORDER — ACETAMINOPHEN 650 MG RE SUPP: 650.0000 mg | Freq: Four times a day (QID) | RECTAL | Status: DC | PRN

## 2015-01-07 MED ORDER — SODIUM CHLORIDE 0.9 % IJ SOLN
3.0000 mL | Freq: Two times a day (BID) | INTRAMUSCULAR | Status: DC
  Administered 2015-01-07 – 2015-01-12 (×3): 3 mL via INTRAVENOUS

## 2015-01-07 MED ORDER — ALUM & MAG HYDROXIDE-SIMETH 200-200-20 MG/5ML PO SUSP
30.0000 mL | Freq: Four times a day (QID) | ORAL | Status: DC | PRN
  Administered 2015-01-07: 30 mL via ORAL
  Filled 2015-01-07: qty 30

## 2015-01-07 MED ORDER — ENOXAPARIN SODIUM 100 MG/ML ~~LOC~~ SOLN
1.0000 mg/kg | Freq: Once | SUBCUTANEOUS | Status: AC
  Administered 2015-01-07: 100 mg via SUBCUTANEOUS
  Filled 2015-01-07: qty 1

## 2015-01-07 MED ORDER — WARFARIN SODIUM 5 MG PO TABS
5.0000 mg | ORAL_TABLET | Freq: Once | ORAL | Status: AC
  Administered 2015-01-07: 5 mg via ORAL
  Filled 2015-01-07: qty 1

## 2015-01-07 NOTE — ED Notes (Signed)
Transporting patient to new room assignment. 

## 2015-01-07 NOTE — Discharge Instructions (Signed)

## 2015-01-07 NOTE — Progress Notes (Addendum)
ANTICOAGULATION CONSULT NOTE - Initial Consult  Pharmacy Consult for Coumadin / Enoxaparin Indication: atrial fibrillation  Allergies  Allergen Reactions  . Codeine Hives and Rash    In cough syrup preparations    Patient Measurements: Height: 5\' 1"  (154.9 cm) Weight: 222 lb (100.699 kg) IBW/kg (Calculated) : 47.8  Vital Signs: Temp: 98 F (36.7 C) (05/08 0035) Temp Source: Oral (05/08 0017) BP: 111/45 mmHg (05/08 0035) Pulse Rate: 62 (05/08 0035)  Labs:  Recent Labs  01/06/15 2058  HGB 12.3  HCT 37.4  PLT 256  CREATININE 1.08*  TROPONINI <0.03    Estimated Creatinine Clearance: 44.5 mL/min (by C-G formula based on Cr of 1.08).   Medical History: Past Medical History  Diagnosis Date  . Allergy   . Asthma   . Hyperlipidemia   . Hypertension   . Thyroid disease   . GERD (gastroesophageal reflux disease)   . CHF (congestive heart failure)   . Bronchitis   . Atrial fibrillation     Medications:  Prescriptions prior to admission  Medication Sig Dispense Refill Last Dose  . albuterol (PROVENTIL HFA;VENTOLIN HFA) 108 (90 BASE) MCG/ACT inhaler Inhale 2 puffs into the lungs every 4 (four) hours as needed for wheezing or shortness of breath. 1 Inhaler 0 01/06/2015 at Unknown time  . amLODipine (NORVASC) 5 MG tablet Take 1 tablet (5 mg total) by mouth every morning. 90 tablet 6 01/06/2015 at Unknown time  . benazepril (LOTENSIN) 20 MG tablet Take 1 tablet (20 mg total) by mouth daily. 90 tablet 6 01/06/2015 at Unknown time  . furosemide (LASIX) 80 MG tablet Take 80 mg by mouth daily.   01/06/2015 at Unknown time  . levothyroxine (SYNTHROID, LEVOTHROID) 100 MCG tablet Take 1 tablet (100 mcg total) by mouth daily. 90 tablet 6 01/06/2015 at Unknown time  . metoprolol succinate (TOPROL-XL) 25 MG 24 hr tablet Take 25 mg by mouth daily.   01/06/2015 at 0700  . warfarin (COUMADIN) 5 MG tablet Take 2.5 mg by mouth daily.   01/06/2015 at Unknown time  . albuterol (PROVENTIL HFA;VENTOLIN  HFA) 108 (90 BASE) MCG/ACT inhaler Inhale 1-2 puffs into the lungs every 6 (six) hours as needed for wheezing. 1 Inhaler 0 Past Month at Unknown  . albuterol (PROVENTIL) (2.5 MG/3ML) 0.083% nebulizer solution Take 3 mLs (2.5 mg total) by nebulization every 4 (four) hours as needed for wheezing. (Patient not taking: Reported on 01/06/2015) 75 mL 12 Not Taking at Unknown time  . furosemide (LASIX) 40 MG tablet Take 1 tablet (40 mg total) by mouth daily. May take an additional evening dose if legs are swelling (Patient not taking: Reported on 01/06/2015) 90 tablet 6 Not Taking at Unknown time  . HYDROcodone-acetaminophen (NORCO/VICODIN) 5-325 MG per tablet Take 1-2 tablets by mouth every 6 hours as needed for pain. (Patient not taking: Reported on 01/06/2015) 15 tablet 0 Not Taking at Unknown time  . levalbuterol (XOPENEX HFA) 45 MCG/ACT inhaler Inhale 1-2 puffs into the lungs every 4 (four) hours as needed for wheezing. (Patient not taking: Reported on 01/06/2015) 1 Inhaler 12 Not Taking at Unknown time   Scheduled:  . amLODipine  5 mg Oral Daily  . aspirin EC  325 mg Oral Daily  . furosemide  80 mg Intravenous Q12H  . heparin  5,000 Units Subcutaneous 3 times per day  . ipratropium-albuterol  3 mL Nebulization Q4H  . levothyroxine  100 mcg Oral Daily  . metoprolol succinate  25 mg Oral Daily  .  potassium chloride  20 mEq Oral BID  . potassium chloride  60 mEq Oral Once  . sodium chloride  3 mL Intravenous Q12H  . sodium chloride  3 mL Intravenous Q12H  . sodium chloride  3 mL Intravenous Q12H  . warfarin  2.5 mg Oral Daily    Assessment: 81yo female c/o SOB and cough/congestion, had recent Dx of CHF, admitted for further w/u, to continue Coumadin for Afib; last dose of Coumadin taken 5/7 PTA.  Goal of Therapy:  INR 2-3   Plan:  Will adjust Coumadin based on INR.  Wynona Neat, PharmD, BCPS  01/07/2015,12:57 AM  ADDENDUM: Patient reports taking coumadin 2.5mg  daily. INR on admission was  subtherapeutic at 1.05. CBC is stable and no s/s of bleed. Pharmacy also consulted to bridge with Lovenox until INR closer to goal.  Plan: Give coumadin 5mg  PO x 1 tonight Start Lovenox 100mg  SQ x 1 now, then start Q12 tomorrow at 0600 Monitor daily INR, CBC, s/s of bleed  Elenor Quinones, PharmD Clinical Pharmacist Resident Pager [phone] 01/07/2015 8:15 AM

## 2015-01-07 NOTE — Progress Notes (Addendum)
Patient ID: [name] Case, female   DOB: [birth year], 81 y.o.   MRN: [id number] [facility] PROGRESS NOTE  RAZIYAH VANVLECK TML:[id number] DOB: 07-04-1932 DOA: 01/06/2015 PCP: Nyoka Cowden, MD  Brief narrative:    81 y.o. female with past medical history of asthma, hypertension, hypothyroidism, recent diagnosis of CHF and atrial fibrillation (was put on lasix and coumadin by PCP). She presented to [facility] ED with worsening shortness of breath over past 24 hours prior to this admission. Apparently per family on the admission, pt was starting to turn blue and on EMS arrival she was placed on [place] oxygen support and was given IV solumedrol.  Please note pt reported having ongoing non productive cough and shortness of breath with wheezing for past 4 weeks prior to this admission but worse in last 24 hours.  In ED, patient's BP was 95/30 but improved to 127/29 and 119/46 subsequently. Her blood work was notable for WBC count 11.7, potassium 3.0, creatinine 1.08, normal troponi level. BNP was WNL. The 12 lead EKG showed sinus rhythm with supraventricular bigeminy. CXR showed no acute cardiopulmonary process. She was admitted for further evaluation of possible CHF exacerbation.  Barrier to discharge: still congested, on IV lasix 80 mg IV Q 12 hours, we are awaiting cardio consult. Anticipate D/C 01/09/15 if she feels better and is adequately diuresed.    Assessment/Plan:    Principal Problem:   Acute respiratory failure with hypoxia / Acute diastolic CHF - Hypoxia likely due to acute CHF. No previous ECHO on file to determine the exact type of CHF but presumably diastolic.  - 2 D ECHO is pending on this admission. Order placed but study not done yet. - BNP WNL on this admission but likely artificially low due to morbid obesity. Troponin level WNL. - Weight since admission, 99.79 kg --> 100.69 kg - Continue Lasix 80 mg IV Q 12 hours - Appreciate cardio consult and recommendations  -  Continue daily weight and strict intake and output - Replete electrolytes as needed  - Continue low dose metoprolol  - ACEi on hold due to renal insufficiency  - Continue aspirin daily  - Continue duoneb nebulizer every 12 hours scheduled    Active Problems:   Hypothyroidism - Continue synthroid    Essential hypertension - Continue Norvasc 5 mg daily, metoprolol 25 mg daily, lasix 80 mg IV Q 12 hours - Benazepril on hold due to renal insufficiency     Acute renal failure - Due to benazepril which was placed on hold - Continue to monitor renal function considering pt on IV lasix    Atrial fibrillation, chronic - CHADS vasc score at least 4 - Rate controlled with metoprolol - On anticoagulation with coumadin (bridge with Lovenox since INR sub therapeutic)    Leukocytosis - Likely reactive - No acute cardiopulmonary findings seen on CXR - WBC count 11.3     Hyperglycemia - No previous history of DM - Follow up  A1c - On SSI   DVT Prophylaxis  - Heparin subQ ordered but her INR is sub therapeutic on coumadin. I will use therapeutic dose Lovenox to bridge with coumadin and will d/c heparin subQ   Code Status: Full.  Family Communication:  plan of care discussed with the patient; family not at the bedside  Disposition Plan: needs PT eval; anticipate D/C 01/09/2015 if respiratory status better.   IV access:  Peripheral IV  Procedures and diagnostic studies:    Dg Chest 2 View 01/06/2015  No  active cardiopulmonary disease.   Electronically Signed   By: Inez Catalina M.D.   On: 01/06/2015 21:49   Medical Consultants:  Cardiology  Other Consultants:  Physical therapy   IAnti-Infectives:   None    Leisa Lenz, MD  [facility] Pager [phone]  Time spent in minutes: 25 minutes  If 7PM-7AM, please contact night-coverage [email or web address] Password [facility] 01/07/2015, 12:41 PM   LOS: 0 days    HPI/Subjective: No acute overnight events. Patient reports cough,  shortness of breath with coughing,   Objective: Filed Vitals:   01/07/15 0532 01/07/15 0840 01/07/15 0913 01/07/15 0948  BP: 119/56 112/64  103/64  Pulse: 52 59  70  Temp: 97.5 F (36.4 C) 97.8 F (36.6 C)    TempSrc:  Oral    Resp: 20 19    Height:      Weight:      SpO2: 93% 97% 97%     Intake/Output Summary (Last 24 hours) at 01/07/15 1241 Last data filed at 01/07/15 1054  Gross per 24 hour  Intake    600 ml  Output   1175 ml  Net   -575 ml    Exam:   General:  Pt is alert, follows commands appropriately, not in acute distress  Cardiovascular: Regular rate and rhythm, S1/S2 (+)  Respiratory: no wheezing, no rhonchi   Abdomen: Soft, obese, non tender, non distended, bowel sounds present  Extremities: No cyanosis, pulses DP and PT palpable bilaterally  Neuro: Grossly nonfocal  Data Reviewed: Basic Metabolic Panel:  Recent Labs Lab 01/06/15 2058 01/07/15 0141 01/07/15 0335  NA 134* 139  --   K 3.0* 3.6  --   CL 98* 99*  --   CO2 29 29  --   GLUCOSE 227* 288*  --   BUN 18 21*  --   CREATININE 1.08* 1.11*  --   CALCIUM 8.3* 8.6*  --   MG  --   --  1.8   Liver Function Tests: No results for input(s): AST, ALT, ALKPHOS, BILITOT, PROT, ALBUMIN in the last 168 hours. No results for input(s): LIPASE, AMYLASE in the last 168 hours. No results for input(s): AMMONIA in the last 168 hours. CBC:  Recent Labs Lab 01/06/15 2058 01/07/15 0141  WBC 11.7* 11.3*  NEUTROABS 9.7*  --   HGB 12.3 12.2  HCT 37.4 37.0  MCV 97.1 96.9  PLT 256 254   Cardiac Enzymes:  Recent Labs Lab 01/06/15 2058  TROPONINI <0.03   BNP: Invalid input(s): POCBNP CBG:  Recent Labs Lab 01/07/15 0758  GLUCAP 238*    No results found for this or any previous visit (from the past 240 hour(s)).   Scheduled Meds: . amLODipine  5 mg Oral Daily  . aspirin EC  325 mg Oral Daily  . furosemide  80 mg Intravenous Q12H  . heparin  5,000 Units Subcutaneous 3 times per day  .  insulin aspart  0-5 Units Subcutaneous QHS  . insulin aspart  0-9 Units Subcutaneous TID WC  . ipratropium-albuterol  3 mL Nebulization BID  . levothyroxine  100 mcg Oral QAC breakfast  . metoprolol succinate  25 mg Oral Daily  . potassium chloride  20 mEq Oral BID  . warfarin  5 mg Oral ONCE-1800

## 2015-01-08 ENCOUNTER — Inpatient Hospital Stay (HOSPITAL_COMMUNITY): Payer: Medicare Other

## 2015-01-08 DIAGNOSIS — I480 Paroxysmal atrial fibrillation: Secondary | ICD-10-CM

## 2015-01-08 DIAGNOSIS — I5033 Acute on chronic diastolic (congestive) heart failure: Principal | ICD-10-CM

## 2015-01-08 DIAGNOSIS — R0600 Dyspnea, unspecified: Secondary | ICD-10-CM

## 2015-01-08 DIAGNOSIS — J069 Acute upper respiratory infection, unspecified: Secondary | ICD-10-CM

## 2015-01-08 DIAGNOSIS — J80 Acute respiratory distress syndrome: Secondary | ICD-10-CM

## 2015-01-08 DIAGNOSIS — I10 Essential (primary) hypertension: Secondary | ICD-10-CM

## 2015-01-08 LAB — BASIC METABOLIC PANEL
ANION GAP: 7 (ref 5–15)
BUN: 32 mg/dL — ABNORMAL HIGH (ref 6–20)
CHLORIDE: 101 mmol/L (ref 101–111)
CO2: 32 mmol/L (ref 22–32)
CREATININE: 1.07 mg/dL — AB (ref 0.44–1.00)
Calcium: 8.6 mg/dL — ABNORMAL LOW (ref 8.9–10.3)
GFR calc non Af Amer: 47 mL/min — ABNORMAL LOW (ref >60)
GFR, EST AFRICAN AMERICAN: 55 mL/min — AB (ref >60)
Glucose, Bld: 86 mg/dL (ref 70–99)
POTASSIUM: 4.2 mmol/L (ref 3.5–5.1)
Sodium: 140 mmol/L (ref 135–145)

## 2015-01-08 LAB — CBC
HCT: 38.1 % (ref 36.0–46.0)
Hemoglobin: 12.1 g/dL (ref 12.0–15.0)
MCH: 31.2 pg (ref 26.0–34.0)
MCHC: 31.8 g/dL (ref 30.0–36.0)
MCV: 98.2 fL (ref 78.0–100.0)
Platelets: 279 10*3/uL (ref 150–400)
RBC: 3.88 MIL/uL (ref 3.87–5.11)
RDW: 13.4 % (ref 11.5–15.5)
WBC: 20.3 10*3/uL — AB (ref 4.0–10.5)

## 2015-01-08 LAB — GLUCOSE, CAPILLARY
GLUCOSE-CAPILLARY: 107 mg/dL — AB (ref 70–99)
GLUCOSE-CAPILLARY: 114 mg/dL — AB (ref 70–99)
Glucose-Capillary: 86 mg/dL (ref 70–99)
Glucose-Capillary: 111 mg/dL — ABNORMAL HIGH (ref 70–99)

## 2015-01-08 LAB — PROTIME-INR
INR: 1.06 (ref 0.00–1.49)
PROTHROMBIN TIME: 13.9 s (ref 11.6–15.2)

## 2015-01-08 LAB — HEMOGLOBIN A1C
Hgb A1c MFr Bld: 6.6 % — ABNORMAL HIGH (ref 4.8–5.6)
MEAN PLASMA GLUCOSE: 143 mg/dL

## 2015-01-08 MED ORDER — WARFARIN SODIUM 5 MG PO TABS
5.0000 mg | ORAL_TABLET | Freq: Once | ORAL | Status: AC
  Administered 2015-01-08: 5 mg via ORAL
  Filled 2015-01-08: qty 1

## 2015-01-08 MED ORDER — DILTIAZEM HCL ER COATED BEADS 120 MG PO CP24
120.0000 mg | ORAL_CAPSULE | Freq: Every day | ORAL | Status: DC
  Administered 2015-01-08 – 2015-01-12 (×5): 120 mg via ORAL
  Filled 2015-01-08 (×5): qty 1

## 2015-01-08 MED ORDER — METOPROLOL SUCCINATE ER 25 MG PO TB24: 25.0000 mg | ORAL_TABLET | Freq: Every day | ORAL | Status: DC

## 2015-01-08 MED ORDER — METOPROLOL SUCCINATE 12.5 MG HALF TABLET: 12.5000 mg | ORAL_TABLET | Freq: Every day | ORAL | Status: DC

## 2015-01-08 NOTE — Progress Notes (Signed)
ANTICOAGULATION CONSULT NOTE - Follow Up Consult  Pharmacy Consult for Coumadin / Lovenox Indication: atrial fibrillation  Allergies  Allergen Reactions  . Codeine Hives and Rash    In cough syrup preparations    Patient Measurements: Height: 5\' 1"  (154.9 cm) Weight: 219 lb (99.338 kg) IBW/kg (Calculated) : 47.8  Vital Signs: Temp: 97.7 F (36.5 C) (05/09 0946) Temp Source: Oral (05/09 0946) BP: 121/51 mmHg (05/09 0946) Pulse Rate: 78 (05/09 0946)  Labs:  Recent Labs  01/06/15 2058 01/07/15 0141 01/08/15 0438  HGB 12.3 12.2 12.1  HCT 37.4 37.0 38.1  PLT 256 254 279  APTT  --  31  --   LABPROT  --  13.8 13.9  INR  --  1.05 1.06  CREATININE 1.08* 1.11*  --   TROPONINI <0.03  --   --     Estimated Creatinine Clearance: 42.9 mL/min (by C-G formula based on Cr of 1.11).   Medications:  Scheduled:  . amLODipine  5 mg Oral Daily  . aspirin EC  325 mg Oral Daily  . enoxaparin (LOVENOX) injection  1 mg/kg Subcutaneous Q12H  . furosemide  80 mg Intravenous Q12H  . insulin aspart  0-5 Units Subcutaneous QHS  . insulin aspart  0-9 Units Subcutaneous TID WC  . ipratropium-albuterol  3 mL Nebulization BID  . levothyroxine  100 mcg Oral QAC breakfast  . metoprolol succinate  25 mg Oral Daily  . potassium chloride  20 mEq Oral BID  . sodium chloride  3 mL Intravenous Q12H  . sodium chloride  3 mL Intravenous Q12H  . Warfarin - Pharmacist Dosing Inpatient   Does not apply q1800    Assessment: 81 year old female on chronic anticoagulation with Coumadin for atrial fibrillation. Her INR was subtherapeutic on admission and is essentially unchanged today. She reports no missed doses. She is also receiving Lovenox for bridging.  Goal of Therapy:  INR 2-3 Monitor platelets by anticoagulation protocol: Yes   Plan:  Coumadin 5mg  today Lovenox 100mg  SQ q12h Continue daily PT/INR CBC at least q72h while on Lovenox Monitor for s/sx of bleeding. May consider decreasing ASA  to 81mg  since she is on Coumadin.  Legrand Como, Pharm.D., BCPS, AAHIVP Clinical Pharmacist Phone: [phone] or [phone] 01/08/2015, 10:22 AM

## 2015-01-08 NOTE — Progress Notes (Signed)
Inpatient Diabetes Program Recommendations  AACE/ADA: New Consensus Statement on Inpatient Glycemic Control (2013)  Target Ranges:  Prepandial:   less than 140 mg/dL      Peak postprandial:   less than 180 mg/dL (1-2 hours)      Critically ill patients:  140 - 180 mg/dL     Results for [name] Case, [name] Case (MRN [id number]) as of 01/08/2015 12:44  Ref. Range 01/08/2015 07:34 01/08/2015 12:17  Glucose-Capillary Latest Ref Range: 70-99 mg/dL 107 (H) 86    Results for [name] Case, [name] Case (MRN [id number]) as of 01/08/2015 12:44  Ref. Range 01/07/2015 01:41  Hemoglobin A1C Latest Ref Range: 4.8-5.6 % 6.6 (H)     Admit with: Acute Diastolic CHF  History: HTN, CHF  Current Insulin Orders: Novolog Sensitive SSI tid ac + HS    **Note patient received IV Solumedrol en route to hospital per EMS.  CBG on arrival elevated likely due to the IV Solumedrol.  **Current CBGs are well controlled and patient not requiring any Novolog SSI so far today (05/09).    MD- A1c 6.6%.  Is this a new diagnosis of DM for this patient?    Per Dr. Prentice Docker H&P note, patient has been getting several rounds of Prednisone at home over the last several weeks for breathing issues.  These rounds of Prednisone may have likely contributed to patient's elevated A1c level?    Will follow [name] RN, MSN, CDE Diabetes Coordinator Inpatient Diabetes Program Team Pager: [phone] (8a-5p)

## 2015-01-08 NOTE — Progress Notes (Signed)
Patient ID: [name] Case, female   DOB: 1932-04-20, 81 y.o.   MRN: [id number] [facility] PROGRESS NOTE  [name] Case JJO:[id number] DOB: [birth year] DOA: 01/06/2015 PCP: Nyoka Cowden, MD  Brief narrative:    81 y.o. female with past medical history of asthma, hypertension, hypothyroidism, recent diagnosis of CHF and atrial fibrillation (was put on lasix and coumadin by PCP). She presented to [facility] ED with worsening shortness of breath over past 24 hours prior to this admission. Apparently per family on the admission, pt was starting to turn blue and on EMS arrival she was placed on [place] oxygen support and was given IV solumedrol.  In ED, patient's BP was 95/30 but improved to 127/29 and 119/46 subsequently. Her blood work was notable for WBC count 11.7, potassium 3.0, creatinine 1.08, normal troponi level. BNP was WNL. The 12 lead EKG showed sinus rhythm with supraventricular bigeminy. CXR showed no acute cardiopulmonary process. She was admitted for further evaluation of possible CHF exacerbation.  Barrier to discharge: Still on IV Lasix 80 mg IV every 12 hours. Appreciate cardiology recommendations. If she feels better in regards to her respiratory status and if no further workup recommended by cardiology then I anticipated discharge by 01/10/2015.   Assessment/Plan:    Principal Problem:   Acute respiratory failure with hypoxia / Acute diastolic CHF - Hypoxia likely secondary to acute diastolic CHF. Patient has no other 2-D echoes on file to determine what type of CHF she presumably has. BMP was within normal limits on this admission which is likely artificially low because of morbid obesity. - 2-D echo on this admission is pending. - Weight in past 48 hours: 99.7 kg --> 100.69 kg --> 99.3 kg. - Depending on what cardiology recommends will continue current diuresis with Lasix 80 mg IV every 12 hours. - Continue daily weights, strict intake and output. - Replete electrolytes  as needed. - Check BMP today. - Off note, ACE inhibitor on hold because of renal insufficiency. At home, patient is on Lotensin 20 mg daily.   Active Problems:   Hypothyroidism - Continue synthroid    Essential hypertension - Continue Norvasc 5 mg daily, metoprolol 25 mg daily, lasix 80 mg IV Q 12 hours. - Patient had heart rate as low as 49 in past 24 hours. Current heart rate is 78. We'll check with cardiology if it is okay to continue metoprolol 25 mg daily or if we should reduce the dose. - ACE inhibitor on hold because of renal insufficiency.    Acute renal failure - Likely secondary to benazepril which was placed on hold. Check BMP today. - Because patient is also on IV Lasix we need to continue to monitor renal function on daily basis.    Atrial fibrillation, chronic - CHADS vasc score at least 4 - Rate controlled with metoprolol. As noted above, heart rate in past 24 hours as low as 49 but currently 78. - On anticoagulation with coumadin and Lovenox until INR at therapeutic level.    Leukocytosis - Likely reactive and possibly from Solu-Medrol given by EMS prior to the admission. - No acute cardiopulmonary findings seen on CXR - Check CBC tomorrow morning.    Hyperglycemia - No previous history of DM - A1c is still pending as of today 01/08/2015. - Patient is currently on sliding scale insulin - CBGs in past 24 hours: 118, 174, 107  DVT Prophylaxis  - Lovenox and Coumadin until INR therapeutic.   Code Status: Full.  Family Communication:  plan of care discussed with the patient; family not at the bedside. I spoke with the patient's granddaughter over the phone 01/08/2015 and gave update on current plan of care. Disposition Plan: Anticipated discharge 01/10/2015 if patient continues to improve and if no further workup recommended by cardiology.  IV access:  Peripheral IV  Procedures and diagnostic studies:    Dg Chest 2 View 01/06/2015  No active cardiopulmonary  disease.   Electronically Signed   By: Inez Catalina M.D.   On: 01/06/2015 21:49   Medical Consultants:  Cardiology  Other Consultants:  Physical therapy   IAnti-Infectives:   None    Leisa Lenz, MD  [facility] Pager [phone]  Time spent in minutes: 25 minutes  If 7PM-7AM, please contact night-coverage [email or web address] Password [email or web address] 01/08/2015, 10:49 AM   LOS: 1 day    HPI/Subjective: No acute overnight events. Patient reports feeling little bit better this morning. Still short of breath with exertion.  Objective: Filed Vitals:   01/07/15 2033 01/08/15 0514 01/08/15 0924 01/08/15 0946  BP: 121/48 129/42  121/51  Pulse: 54 49  78  Temp: 98.6 F (37 C) 98 F (36.7 C)  97.7 F (36.5 C)  TempSrc:    Oral  Resp: 22 21  20   Height:      Weight: 99.338 kg (219 lb)     SpO2: 95% 96% 98% 97%    Intake/Output Summary (Last 24 hours) at 01/08/15 1049 Last data filed at 01/08/15 1036  Gross per 24 hour  Intake   1060 ml  Output   2775 ml  Net  -1715 ml    Exam:   General:  Pt is alert, no distress  Cardiovascular: Rate control, appreciate S1-S2  Respiratory: Congested, no rhonchi  Abdomen: Nontender, nondistended abdomen, appreciate bowel sounds  Extremities: Pulses palpable, no cyanosis  Neuro: No focal deficits.  Data Reviewed: Basic Metabolic Panel:  Recent Labs Lab 01/06/15 2058 01/07/15 0141 01/07/15 0335  NA 134* 139  --   K 3.0* 3.6  --   CL 98* 99*  --   CO2 29 29  --   GLUCOSE 227* 288*  --   BUN 18 21*  --   CREATININE 1.08* 1.11*  --   CALCIUM 8.3* 8.6*  --   MG  --   --  1.8   Liver Function Tests: No results for input(s): AST, ALT, ALKPHOS, BILITOT, PROT, ALBUMIN in the last 168 hours. No results for input(s): LIPASE, AMYLASE in the last 168 hours. No results for input(s): AMMONIA in the last 168 hours. CBC:  Recent Labs Lab 01/06/15 2058 01/07/15 0141 01/08/15 0438  WBC 11.7* 11.3* 20.3*  NEUTROABS 9.7*  --   --    HGB 12.3 12.2 12.1  HCT 37.4 37.0 38.1  MCV 97.1 96.9 98.2  PLT 256 254 279   Cardiac Enzymes:  Recent Labs Lab 01/06/15 2058  TROPONINI <0.03   BNP: Invalid input(s): POCBNP CBG:  Recent Labs Lab 01/07/15 0758 01/07/15 1238 01/07/15 1628 01/07/15 2031 01/08/15 0734  GLUCAP 238* 188* 118* 174* 107*    No results found for this or any previous visit (from the past 240 hour(s)).   Scheduled Meds: . amLODipine  5 mg Oral Daily  . aspirin EC  325 mg Oral Daily  . furosemide  80 mg Intravenous Q12H  . heparin  5,000 Units Subcutaneous 3 times per day  . insulin aspart  0-5 Units Subcutaneous QHS  . insulin aspart  0-9  Units Subcutaneous TID WC  . ipratropium-albuterol  3 mL Nebulization BID  . levothyroxine  100 mcg Oral QAC breakfast  . metoprolol succinate  25 mg Oral Daily  . potassium chloride  20 mEq Oral BID  . warfarin  5 mg Oral ONCE-1800

## 2015-01-08 NOTE — Consult Note (Signed)
Admit date: 01/06/2015 Referring Physician  Charlies Silvers Primary Physician Nyoka Cowden, MD Primary Cardiologist  none Reason for Consultation  AFIB and CHF  HPI: 81 year old female with asthma, hypertension, hypothyroidism with new diagnosis of diastolic heart failure and atrial fibrillation, treated by primary physician, Dr. Claris Gower with Lasix and Coumadin, who presented to the emergency department with worsening shortness of breath and cough over approximately 24 hours duration area she was appearing cyanotic and was placed on IV Medrol, oxygen per EMS. BNP was normal, troponin was normal. PACs were noted in a bigeminy pattern on EKG on admission. Chest x-ray showed no acute cardiopulmonary process.  She reports that over the past several weeks she has been coughing quite significantly. She tries to bring up mucus and sometimes she does bring up green sputum. It seems hard for her to cough this up she states.  Since hospitalization, she has been on IV Lasix 80 mg every 12 hours for the past 2 days. Her BUN has doubled, creatinine has remained stable.  Daily weights may be unreliable ranging from 99.7-100.69-99.3. ACE inhibitor has been on hold because of renal insufficiency, at home on Lotensin 20 mg a day.  She still reports significant dyspnea, cough. No prior cardiac history per patient. She states that she does not feel well. She states that she has battled bronchitis/asthma for quite some time.    PMH:   Past Medical History  Diagnosis Date  . Allergy   . Asthma   . Hyperlipidemia   . Hypertension   . Thyroid disease   . GERD (gastroesophageal reflux disease)   . CHF (congestive heart failure)   . Bronchitis   . Atrial fibrillation     PSH:   Past Surgical History  Procedure Laterality Date  . Appendectomy    . Cholecystectomy    . Abdominal hysterectomy    . Total knee arthroplasty     Allergies:  Codeine Prior to Admit Meds:   Prior to Admission  medications   Medication Sig Start Date End Date Taking? Authorizing Provider  albuterol (PROVENTIL HFA;VENTOLIN HFA) 108 (90 BASE) MCG/ACT inhaler Inhale 2 puffs into the lungs every 4 (four) hours as needed for wheezing or shortness of breath. 12/04/13  Yes Harden Mo, MD  amLODipine (NORVASC) 5 MG tablet Take 1 tablet (5 mg total) by mouth every morning. 08/19/12  Yes Marletta Lor, MD  benazepril (LOTENSIN) 20 MG tablet Take 1 tablet (20 mg total) by mouth daily. 08/19/12  Yes Marletta Lor, MD  furosemide (LASIX) 80 MG tablet Take 80 mg by mouth daily. 01/01/15  Yes Historical Provider, MD  levothyroxine (SYNTHROID, LEVOTHROID) 100 MCG tablet Take 1 tablet (100 mcg total) by mouth daily. 08/19/12  Yes Marletta Lor, MD  metoprolol succinate (TOPROL-XL) 25 MG 24 hr tablet Take 25 mg by mouth daily. 01/01/15  Yes Historical Provider, MD  warfarin (COUMADIN) 5 MG tablet Take 2.5 mg by mouth daily. 01/01/15  Yes Historical Provider, MD  albuterol (PROVENTIL HFA;VENTOLIN HFA) 108 (90 BASE) MCG/ACT inhaler Inhale 1-2 puffs into the lungs every 6 (six) hours as needed for wheezing. 08/19/12 08/19/13  Marletta Lor, MD  albuterol (PROVENTIL) (2.5 MG/3ML) 0.083% nebulizer solution Take 3 mLs (2.5 mg total) by nebulization every 4 (four) hours as needed for wheezing. Patient not taking: Reported on 01/06/2015 12/04/13   Harden Mo, MD  furosemide (LASIX) 40 MG tablet Take 1 tablet (40 mg total) by mouth daily. May take  an additional evening dose if legs are swelling Patient not taking: Reported on 01/06/2015 08/19/12   Marletta Lor, MD  HYDROcodone-acetaminophen (NORCO/VICODIN) 5-325 MG per tablet Take 1-2 tablets by mouth every 6 hours as needed for pain. Patient not taking: Reported on 01/06/2015 06/28/14   Elmyra Ricks Pisciotta, PA-C  levalbuterol [facility] HFA) 45 MCG/ACT inhaler Inhale 1-2 puffs into the lungs every 4 (four) hours as needed for wheezing. Patient not taking: Reported  on 01/06/2015 07/03/12   Shanon Rosser, MD   Fam HX:    Family History  Problem Relation Age of Onset  . Cancer Mother     colon  . Heart disease Mother    Social HX:    History   Social History  . Marital Status: Widowed    Spouse Name: N/A  . Number of Children: N/A  . Years of Education: N/A   Occupational History  . Not on file.   Social History Main Topics  . Smoking status: Never Smoker   . Smokeless tobacco: Never Used  . Alcohol Use: No  . Drug Use: No  . Sexual Activity: Not on file   Other Topics Concern  . Not on file   Social History Narrative     ROS:  All 11 ROS were addressed and are negative except what is stated in the HPI   Physical Exam: Blood pressure 121/51, pulse 78, temperature 97.7 F (36.5 C), temperature source Oral, resp. rate 20, height 5\' 1"  (1.549 m), weight 219 lb (99.338 kg), SpO2 97 %.   General: Well developed, obese, well nourished, in no acute distress Head: Eyes PERRLA, No xanthomas.   Normal cephalic and atramatic  Lungs:   Diffuse wheezes heard bilaterally both inspiratory and expiratory mild increased respiratory effort. Heart:   HRRR S1 S2 occasional ectopy Pulses are 2+ & equal. No murmur, rubs, gallops.  No carotid bruit. No JVD.  No abdominal bruits. Distant heart sounds secondary to body habitus Abdomen: Bowel sounds are positive, abdomen soft and non-tender without masses. No hepatosplenomegaly. Obese Msk:  Back normal. Normal strength and tone for age. Extremities:  No clubbing, cyanosis or edema.  DP +1 Neuro: Alert and oriented X 3, non-focal, MAE x 4 GU: Deferred Rectal: Deferred Psych:  Good affect, responds appropriately      Labs: Lab Results  Component Value Date   WBC 20.3* 01/08/2015   HGB 12.1 01/08/2015   HCT 38.1 01/08/2015   MCV 98.2 01/08/2015   PLT 279 01/08/2015     Recent Labs Lab 01/08/15 1145  NA 140  K 4.2  CL 101  CO2 32  BUN 32*  CREATININE 1.07*  CALCIUM 8.6*  GLUCOSE 86     Recent Labs  01/06/15 2058  TROPONINI <0.03   Lab Results  Component Value Date   CHOL 141 07/01/2012   HDL 41.00 07/01/2012   LDLCALC 74 07/01/2012   TRIG 130.0 07/01/2012   Lab Results  Component Value Date   DDIMER 0.41 01/07/2015     Radiology:  Dg Chest 2 View  01/06/2015   CLINICAL DATA:  Productive cough and shortness of Breath  EXAM: CHEST  2 VIEW  COMPARISON:  12/29/2014  FINDINGS: Cardiac shadow is mildly enlarged but stable. The lungs are well aerated bilaterally. No focal infiltrate or sizable effusion is seen. No acute bony abnormality is noted.  IMPRESSION: No active cardiopulmonary disease.   Electronically Signed   By: Inez Catalina M.D.   On: 01/06/2015 21:49  Personally viewed.  EKG:  01/06/15-sinus rhythm, PACs, nonspecific ST-T wave changes, brief atrial bigeminy. No atrial fibrillation Personally viewed.   Scheduled Meds: . aspirin EC  325 mg Oral Daily  . diltiazem  120 mg Oral Daily  . enoxaparin (LOVENOX) injection  1 mg/kg Subcutaneous Q12H  . furosemide  80 mg Intravenous Q12H  . insulin aspart  0-5 Units Subcutaneous QHS  . insulin aspart  0-9 Units Subcutaneous TID WC  . ipratropium-albuterol  3 mL Nebulization BID  . levothyroxine  100 mcg Oral QAC breakfast  . sodium chloride  3 mL Intravenous Q12H  . sodium chloride  3 mL Intravenous Q12H  . warfarin  5 mg Oral ONCE-1800  . Warfarin - Pharmacist Dosing Inpatient   Does not apply q1800   Continuous Infusions:  PRN Meds:.sodium chloride, acetaminophen **OR** acetaminophen, alum & mag hydroxide-simeth, guaiFENesin, morphine injection, ondansetron **OR** ondansetron (ZOFRAN) IV, sodium chloride  ASSESSMENT/PLAN:    81 year old with the following medical issues:  1. Acute on chronic diastolic heart failure/respiratory failure-it is interesting that her BNP is normal at 71 (obese patients tend to have a lower plasma BNP concentration than nonobese patients). She has been given IV Lasix 80 mg  twice a day over 2 days and her BUN has increased from 18-32. Her creatinine remained stable at 1.07. I do believe that her shortness of breath is more pulmonary related than cardiac hence the normal BNP on admission. She states that she has battled bronchitis/asthma for quite some time and over the past 3 weeks this has gotten worse. I would stop her Lasix 80 mg IV. Diagnosis of diastolic heart failure should be reconsidered. I will order an echocardiogram to evaluate her overall LV function. Consider pulmonary consultation.  2. Atrial fibrillation paroxysmal-chadsvasc score of at least 4. Agree with anticoagulation. Currently sinus rhythm on metoprolol. Just in case metoprolol is causing any reactive airways, I will change her over to diltiazem 120 mg CD once a day and discontinue her amlodipine 5 mg. Has had transient bradycardia but responds quite well to movement. She has not had atrial fibrillation on EKGs here. PACs noted. I will trust that this diagnosis has been made based upon prior EKG data which is not available to me.  3. Obesity-encourage weight loss  4. Essential hypertension-currently well controlled. Medications reviewed.  5. Leukocytosis-20 from 11. SoluMedrol.  We'll follow along with results of echocardiogram.  Candee Furbish, MD  01/08/2015  1:21 PM

## 2015-01-08 NOTE — Progress Notes (Signed)
Echocardiogram 2D Echocardiogram has been performed.  Joelene Millin 01/08/2015, 3:28 PM

## 2015-01-09 ENCOUNTER — Other Ambulatory Visit (HOSPITAL_COMMUNITY): Payer: Medicare Other

## 2015-01-09 DIAGNOSIS — J209 Acute bronchitis, unspecified: Secondary | ICD-10-CM

## 2015-01-09 DIAGNOSIS — E1165 Type 2 diabetes mellitus with hyperglycemia: Secondary | ICD-10-CM

## 2015-01-09 DIAGNOSIS — R0600 Dyspnea, unspecified: Secondary | ICD-10-CM

## 2015-01-09 DIAGNOSIS — E119 Type 2 diabetes mellitus without complications: Secondary | ICD-10-CM | POA: Insufficient documentation

## 2015-01-09 DIAGNOSIS — E039 Hypothyroidism, unspecified: Secondary | ICD-10-CM

## 2015-01-09 LAB — GLUCOSE, CAPILLARY
GLUCOSE-CAPILLARY: 109 mg/dL — AB (ref 70–99)
GLUCOSE-CAPILLARY: 181 mg/dL — AB (ref 70–99)
Glucose-Capillary: 128 mg/dL — ABNORMAL HIGH (ref 70–99)
Glucose-Capillary: 162 mg/dL — ABNORMAL HIGH (ref 70–99)

## 2015-01-09 LAB — CBC
HEMATOCRIT: 38.6 % (ref 36.0–46.0)
HEMOGLOBIN: 12 g/dL (ref 12.0–15.0)
MCH: 30.8 pg (ref 26.0–34.0)
MCHC: 31.1 g/dL (ref 30.0–36.0)
MCV: 99.2 fL (ref 78.0–100.0)
PLATELETS: 293 10*3/uL (ref 150–400)
RBC: 3.89 MIL/uL (ref 3.87–5.11)
RDW: 13.6 % (ref 11.5–15.5)
WBC: 10.8 10*3/uL — AB (ref 4.0–10.5)

## 2015-01-09 LAB — PROTIME-INR
INR: 1.11 (ref 0.00–1.49)
Prothrombin Time: 14.4 seconds (ref 11.6–15.2)

## 2015-01-09 MED ORDER — WARFARIN SODIUM 7.5 MG PO TABS
7.5000 mg | ORAL_TABLET | Freq: Once | ORAL | Status: AC
  Administered 2015-01-09: 7.5 mg via ORAL
  Filled 2015-01-09: qty 1

## 2015-01-09 MED ORDER — PREDNISONE 20 MG PO TABS
40.0000 mg | ORAL_TABLET | Freq: Two times a day (BID) | ORAL | Status: DC
  Administered 2015-01-09 – 2015-01-11 (×5): 40 mg via ORAL
  Filled 2015-01-09 (×8): qty 2

## 2015-01-09 MED ORDER — INSULIN ASPART 100 UNIT/ML ~~LOC~~ SOLN
0.0000 [IU] | Freq: Three times a day (TID) | SUBCUTANEOUS | Status: DC
  Administered 2015-01-10: 3 [IU] via SUBCUTANEOUS
  Administered 2015-01-10 (×2): 2 [IU] via SUBCUTANEOUS
  Administered 2015-01-11: 5 [IU] via SUBCUTANEOUS
  Administered 2015-01-12: 3 [IU] via SUBCUTANEOUS
  Administered 2015-01-12: 2 [IU] via SUBCUTANEOUS
  Administered 2015-01-12: 3 [IU] via SUBCUTANEOUS

## 2015-01-09 MED ORDER — DOXYCYCLINE HYCLATE 100 MG PO TABS
100.0000 mg | ORAL_TABLET | Freq: Two times a day (BID) | ORAL | Status: DC
  Administered 2015-01-09 – 2015-01-10 (×3): 100 mg via ORAL
  Filled 2015-01-09 (×4): qty 1

## 2015-01-09 MED ORDER — BUDESONIDE-FORMOTEROL FUMARATE 160-4.5 MCG/ACT IN AERO
2.0000 | INHALATION_SPRAY | Freq: Two times a day (BID) | RESPIRATORY_TRACT | Status: DC
  Administered 2015-01-09 – 2015-01-12 (×7): 2 via RESPIRATORY_TRACT
  Filled 2015-01-09: qty 6

## 2015-01-09 MED ORDER — FUROSEMIDE 40 MG PO TABS
40.0000 mg | ORAL_TABLET | Freq: Every day | ORAL | Status: DC
  Administered 2015-01-09 – 2015-01-12 (×4): 40 mg via ORAL
  Filled 2015-01-09 (×4): qty 1

## 2015-01-09 NOTE — Progress Notes (Signed)
ANTICOAGULATION CONSULT NOTE - Follow Up Consult  Pharmacy Consult for warfarin and Lovenox Indication: atrial fibrillation  Allergies  Allergen Reactions  . Codeine Hives and Rash    In cough syrup preparations    Patient Measurements: Height: 5\' 1"  (154.9 cm) Weight: 221 lb (100.245 kg) IBW/kg (Calculated) : 47.8  Vital Signs: Temp: 98.8 F (37.1 C) (05/10 0935) Temp Source: Oral (05/10 0935) BP: 120/52 mmHg (05/10 0935) Pulse Rate: 60 (05/10 0935)  Labs:  Recent Labs  01/06/15 2058 01/07/15 0141 01/08/15 0438 01/08/15 1145 01/09/15 0552  HGB 12.3 12.2 12.1  --  12.0  HCT 37.4 37.0 38.1  --  38.6  PLT 256 254 279  --  293  APTT  --  31  --   --   --   LABPROT  --  13.8 13.9  --  14.4  INR  --  1.05 1.06  --  1.11  CREATININE 1.08* 1.11*  --  1.07*  --   TROPONINI <0.03  --   --   --   --     Estimated Creatinine Clearance: 44.8 mL/min (by C-G formula based on Cr of 1.07).   Assessment: 81 year old female on chronic anticoagulation with Coumadin for atrial fibrillation. Her INR was subtherapeutic on admission and is remains low today at 1.11. She is also receiving Lovenox for bridging.  SCr 1.06 with est CrCL ~40-69mL/min. CBC is WNL and stable, no bleeding noted.  Note patient is on steroids and doxycycline both of which can potentiate INR.  Patient was on ASA 325mg -this was not a home medication and was ordered on admission. Patient does not have stents, and full dose ASA not required. Spoke with Dr. Marlou Porch about either lowering dose to 81mg  or discontinuing- decision made to stop ASA completely.  Goal of Therapy:  INR 2-3 Monitor platelets by anticoagulation protocol: Yes   Plan:  -Warfarin 7.5mg  po x1 tonight -Lovenox 100mg  subQ q12h -Continue daily PT/INR -CBC at least q72h while on Lovenox -Monitor for s/sx of bleeding.   Jawuan Robb D. Klye Besecker, PharmD, BCPS Clinical Pharmacist Pager: [phone] 01/09/2015 11:01 AM

## 2015-01-09 NOTE — Care Management Note (Signed)
Case Management Note  Patient Details  Name: SAYLAH KETNER MRN: [id number] Date of Birth: 11/26/1931  Subjective/Objective:                    Action/Plan: Pt plan is to d/c to home , she is agreeable to [facility] with disease management for CHF and PT for strengthening. CM following for DME needs.   Expected Discharge Date:                 01/11/2015 Expected Discharge Plan:  [place]  In-House Referral:     Discharge planning Services     Post Acute Care Choice:    Choice offered to:     DME Arranged:    DME Agency:     HH Arranged:  RN, PT, Disease Management [place] Agency:  [place]  Status of Service:  In process, will continue to follow  Medicare Important Message Given:  Yes Date Medicare IM Given:  01/09/15 Medicare IM give by:  Jasmine Pang RN MPH, case manager, [phone] Date Additional Medicare IM Given:    Additional Medicare Important Message give by:     If discussed at [place] of Stay Meetings, dates discussed:    Additional Comments:  Adron Bene, RN 01/09/2015, 3:42 PM

## 2015-01-09 NOTE — Progress Notes (Addendum)
    Subjective:  Feeling a little better. Reassuring ECHO. Getting a bath  Objective:  Vital Signs in the last 24 hours: Temp:  [98 F (36.7 C)-98.8 F (37.1 C)] 98.8 F (37.1 C) (05/10 0935) Pulse Rate:  [55-68] 60 (05/10 0935) Resp:  [20-22] 20 (05/10 0935) BP: (120-130)/(46-52) 120/52 mmHg (05/10 0935) SpO2:  [93 %-96 %] 96 % (05/10 0935) Weight:  [221 lb (100.245 kg)] 221 lb (100.245 kg) (05/10 0500)  Intake/Output from previous day: 05/09 0701 - 05/10 0700 In: 360 [P.O.:360] Out: 2670 [Urine:2670]   Physical Exam: General: Well developed, well nourished, in no acute distress. Head:  Normocephalic and atraumatic. Lungs:Diffuse mild wheezes heard bilaterally  expiratory mild increased respiratory effort (improved) Heart: Normal S1 and S2.  Occas ectopy No murmur, rubs or gallops.  Abdomen: soft, non-tender, positive bowel sounds. obese Extremities: No clubbing or cyanosis. No edema. Neurologic: Alert and oriented x 3.    Lab Results:  Recent Labs  01/08/15 0438 01/09/15 0552  WBC 20.3* 10.8*  HGB 12.1 12.0  PLT 279 293    Recent Labs  01/07/15 0141 01/08/15 1145  NA 139 140  K 3.6 4.2  CL 99* 101  CO2 29 32  GLUCOSE 288* 86  BUN 21* 32*  CREATININE 1.11* 1.07*    Recent Labs  01/06/15 2058  TROPONINI <0.03    Telemetry: NSR, PAC Personally viewed.   EKG:  NO AFIB  Cardiac Studies:  ECHO EF normal  Assessment/Plan:  Principal Problem:   Acute respiratory distress Active Problems:   Hypothyroidism   Hyperlipidemia   Essential hypertension   Acute upper respiratory infection   Asthma   Dyspnea   Acute diastolic CHF (congestive heart failure)   Bronchospasm   Atrial fibrillation   Warfarin-induced coagulopathy   Respiratory failure/asthma  - ECHO reassuring, normal EF  - BNP reassuring, 70's  - Does not appear to be driven by cardiac etiology  - Consider pulmonary consultation  - I do not believe she has diastolic heart failure  (or at least it is not a major component in her reactive airways).  - No further lasix (BUN doubled, creat stable)  PAF  - NO AFIB since she has been here  - CAHDSVASc 4  - Have to assume to afib has been seen in the past on prior ECG and this is why she is on coumadin. On auscultation with frequent ectopy [facility]) this has characteristic exam findings of AFIB. She does recall Dr. Arelia Sneddon telling her that her atria were moving 300 times a min.   Obesity  - weight loss  Essential hypertension  - meds reviewed. Stable  Will sign off. No further cardiac evaluation at this time.    Genavie Boettger, [place] 01/09/2015, 11:00 AM

## 2015-01-09 NOTE — Progress Notes (Signed)
Patient ID: [name] Case, female   DOB: 07/29/1932, 81 y.o.   MRN: [id number] [facility] PROGRESS NOTE  [name] Case [id number] DOB: [birth year] DOA: 01/06/2015 PCP: Nyoka Cowden, MD  Brief narrative:    81 y.o. female with past medical history of asthma, hypertension, hypothyroidism, recent diagnosis of CHF and atrial fibrillation (was put on lasix and coumadin by PCP). She presented to [facility] ED with worsening shortness of breath over past 24 hours prior to this admission. Apparently per family on the admission, pt was starting to turn blue and on EMS arrival she was placed on [place] oxygen support and was given IV solumedrol.  In ED, patient's BP was 95/30 but improved to 127/29 and 119/46 subsequently. Her blood work was notable for WBC count 11.7, potassium 3.0, creatinine 1.08, normal troponi level. BNP was WNL. The 12 lead EKG showed sinus rhythm with supraventricular bigeminy. CXR showed no acute cardiopulmonary process. She was admitted for further evaluation of possible CHF exacerbation.  Barrier to discharge: Still SOB and with o2 supplementation.   Assessment/Plan:   Acute respiratory failure with hypoxia due to asthma/COPD exacerbation/bronchitis and mild  Acute on chronic diastolic CHF -slowly improving -will now stop lasix ; patient has had good diuresis and is currently compensated -will start doxycycline, flutter valve, prednisone, pulmicort and duonebs -follow clinical response -titrate O2 supplementation as tolerated -will check CXR in am -echo results with EF is preserved, grade 1 diastolic HF; no wall motion abnormalities  Hypothyroidism - Continue synthroid  Essential hypertension - Continue Norvasc 5 mg daily, metoprolol 25 mg daily no more lasix -BP is well controlled  Acute renal failure: due to diuresis -Likely secondary to benazepril which was placed on hold and diuresis.  -Follow renal function -will stop lasix for now   Atrial  fibrillation, chronic - CHADS vasc score at least 4 - Rate controlled with metoprolol.  - On anticoagulation with coumadin and Lovenox until INR at therapeutic level. -pharmacy helping with INR  Leukocytosis - Likely reactive and possibly from Solu-Medrol given by EMS. -patient now with productive cough and rhonchi/wheezing on exam -will treat with doxycycline for bronchitis -WBC's trending down -anticipate further el;eavtion as she is back on prednisone   Diabetes with Hyperglycemia -A1C 6.6 -will need oral hypoglycemic agents at discharge -while inpatient will continue SSI and modified carb diet  DVT Prophylaxis  - Lovenox and Coumadin until INR therapeutic.  Code Status: Full.  Family Communication:  plan of care discussed with the patient and granddaughter over the phone Disposition Plan: Anticipated discharge 01/11/2015 if patient continues to improve  IV access:  Peripheral IV  Procedures and diagnostic studies:    Dg Chest 2 View 01/06/2015  No active cardiopulmonary disease.   Electronically Signed   By: Inez Catalina M.D.   On: 01/06/2015 21:49   2-D echo - Left ventricle: The cavity size was normal. There was moderate concentric hypertrophy. Systolic function was normal. The estimated ejection fraction was in the range of 50% to 55%. Wall motion was normal; there were no regional wall motion abnormalities.  Medical Consultants:  Cardiology  Other Consultants:  Physical therapy   IAnti-Infectives:   None    Barton Dubois, MD  [facility] Pager [phone]  Time spent in minutes: 25 minutes  If 7PM-7AM, please contact night-coverage [email or web address] Password [facility] 01/09/2015, 6:55 PM   LOS: 2 days    HPI/Subjective: Feeling better and breathing slightly easier; still SOB and requiring O2 supplementation. Denies CP.  Objective:  Filed Vitals:   01/08/15 2046 01/09/15 0500 01/09/15 0935 01/09/15 1709  BP: 124/46 130/47 120/52 116/50   Pulse: 68 55 60 66  Temp: 98 F (36.7 C) 98.4 F (36.9 C) 98.8 F (37.1 C) 98.2 F (36.8 C)  TempSrc: Oral Oral Oral Oral  Resp: 22 20 20 20   Height:      Weight: 100.245 kg (221 lb) 100.245 kg (221 lb)    SpO2: 93% 94% 96% 97%    Intake/Output Summary (Last 24 hours) at 01/09/15 1855 Last data filed at 01/09/15 1700  Gross per 24 hour  Intake    600 ml  Output   1950 ml  Net  -1350 ml    Exam:   General:  Pt is alert, no CP and no orthopnea. Still complaining of SOB and now with productive cough.  Cardiovascular: Rate control, appreciate S1-S2; no rubs o rgallops  Respiratory: positive exp wheezing, scattered rhonchi; no crackles  Abdomen: Nontender, nondistended abdomen, appreciate bowel sounds  Extremities: Pulses palpable, no cyanosis  Neuro: No focal deficits.  Data Reviewed: Basic Metabolic Panel:  Recent Labs Lab 01/06/15 2058 01/07/15 0141 01/07/15 0335 01/08/15 1145  NA 134* 139  --  140  K 3.0* 3.6  --  4.2  CL 98* 99*  --  101  CO2 29 29  --  32  GLUCOSE 227* 288*  --  86  BUN 18 21*  --  32*  CREATININE 1.08* 1.11*  --  1.07*  CALCIUM 8.3* 8.6*  --  8.6*  MG  --   --  1.8  --    CBC:  Recent Labs Lab 01/06/15 2058 01/07/15 0141 01/08/15 0438 01/09/15 0552  WBC 11.7* 11.3* 20.3* 10.8*  NEUTROABS 9.7*  --   --   --   HGB 12.3 12.2 12.1 12.0  HCT 37.4 37.0 38.1 38.6  MCV 97.1 96.9 98.2 99.2  PLT 256 254 279 293   Cardiac Enzymes:  Recent Labs Lab 01/06/15 2058  TROPONINI <0.03   CBG:  Recent Labs Lab 01/08/15 1643 01/08/15 2045 01/09/15 0814 01/09/15 1133 01/09/15 1640  GLUCAP 111* 114* 109* 128* 162*    Scheduled Meds: . amLODipine  5 mg Oral Daily  . aspirin EC  325 mg Oral Daily  . furosemide  80 mg Intravenous Q12H  . heparin  5,000 Units Subcutaneous 3 times per day  . insulin aspart  0-5 Units Subcutaneous QHS  . insulin aspart  0-9 Units Subcutaneous TID WC  . ipratropium-albuterol  3 mL Nebulization BID   . levothyroxine  100 mcg Oral QAC breakfast  . metoprolol succinate  25 mg Oral Daily  . potassium chloride  20 mEq Oral BID  . warfarin  5 mg Oral ONCE-1800

## 2015-01-10 ENCOUNTER — Inpatient Hospital Stay (HOSPITAL_COMMUNITY): Payer: Medicare Other

## 2015-01-10 DIAGNOSIS — T45515A Adverse effect of anticoagulants, initial encounter: Secondary | ICD-10-CM

## 2015-01-10 DIAGNOSIS — D699 Hemorrhagic condition, unspecified: Secondary | ICD-10-CM

## 2015-01-10 LAB — BASIC METABOLIC PANEL
Anion gap: 12 (ref 5–15)
BUN: 28 mg/dL — ABNORMAL HIGH (ref 6–20)
CALCIUM: 9.4 mg/dL (ref 8.9–10.3)
CHLORIDE: 93 mmol/L — AB (ref 101–111)
CO2: 33 mmol/L — AB (ref 22–32)
CREATININE: 0.91 mg/dL (ref 0.44–1.00)
GFR calc Af Amer: >60 mL/min (ref >60)
GFR calc non Af Amer: 58 mL/min — ABNORMAL LOW (ref >60)
GLUCOSE: 147 mg/dL — AB (ref 70–99)
Potassium: 4.2 mmol/L (ref 3.5–5.1)
SODIUM: 138 mmol/L (ref 135–145)

## 2015-01-10 LAB — CBC
HEMATOCRIT: 37.4 % (ref 36.0–46.0)
HEMOGLOBIN: 12 g/dL (ref 12.0–15.0)
MCH: 31.3 pg (ref 26.0–34.0)
MCHC: 32.1 g/dL (ref 30.0–36.0)
MCV: 97.7 fL (ref 78.0–100.0)
Platelets: 313 10*3/uL (ref 150–400)
RBC: 3.83 MIL/uL — ABNORMAL LOW (ref 3.87–5.11)
RDW: 13.1 % (ref 11.5–15.5)
WBC: 14 10*3/uL — AB (ref 4.0–10.5)

## 2015-01-10 LAB — GLUCOSE, CAPILLARY
GLUCOSE-CAPILLARY: 146 mg/dL — AB (ref 70–99)
Glucose-Capillary: 131 mg/dL — ABNORMAL HIGH (ref 70–99)
Glucose-Capillary: 165 mg/dL — ABNORMAL HIGH (ref 70–99)
Glucose-Capillary: 205 mg/dL — ABNORMAL HIGH (ref 70–99)

## 2015-01-10 LAB — PROTIME-INR
INR: 1.16 (ref 0.00–1.49)
PROTHROMBIN TIME: 14.9 s (ref 11.6–15.2)

## 2015-01-10 MED ORDER — WARFARIN SODIUM 10 MG PO TABS
10.0000 mg | ORAL_TABLET | Freq: Once | ORAL | Status: AC
  Administered 2015-01-10: 10 mg via ORAL
  Filled 2015-01-10: qty 1

## 2015-01-10 MED ORDER — LEVOFLOXACIN 750 MG PO TABS
750.0000 mg | ORAL_TABLET | ORAL | Status: DC
  Administered 2015-01-10 – 2015-01-12 (×3): 750 mg via ORAL
  Filled 2015-01-10 (×3): qty 1

## 2015-01-10 MED ORDER — GUAIFENESIN ER 600 MG PO TB12
1200.0000 mg | ORAL_TABLET | Freq: Two times a day (BID) | ORAL | Status: DC | PRN
  Filled 2015-01-10: qty 2

## 2015-01-10 MED ORDER — IPRATROPIUM-ALBUTEROL 0.5-2.5 (3) MG/3ML IN SOLN
3.0000 mL | Freq: Four times a day (QID) | RESPIRATORY_TRACT | Status: DC
  Administered 2015-01-10 – 2015-01-11 (×3): 3 mL via RESPIRATORY_TRACT
  Filled 2015-01-10 (×2): qty 3

## 2015-01-10 NOTE — Progress Notes (Signed)
ANTICOAGULATION CONSULT NOTE - Follow Up Consult  Pharmacy Consult for warfarin and Lovenox Indication: atrial fibrillation  Allergies  Allergen Reactions  . Codeine Hives and Rash    In cough syrup preparations    Patient Measurements: Height: 5\' 1"  (154.9 cm) Weight: 201 lb 12.6 oz (91.53 kg) IBW/kg (Calculated) : 47.8  Vital Signs: Temp: 97.4 F (36.3 C) (05/11 0506) Temp Source: Oral (05/11 0506) BP: 115/46 mmHg (05/11 0506) Pulse Rate: 58 (05/11 0506)  Labs:  Recent Labs  01/08/15 0438 01/08/15 1145 01/09/15 0552 01/10/15 0411  HGB 12.1  --  12.0 12.0  HCT 38.1  --  38.6 37.4  PLT 279  --  293 313  LABPROT 13.9  --  14.4 14.9  INR 1.06  --  1.11 1.16  CREATININE  --  1.07*  --   --     Estimated Creatinine Clearance: 42.5 mL/min (by C-G formula based on Cr of 1.07).   Assessment: 81 year old female on chronic anticoagulation with Coumadin for atrial fibrillation. Her INR was subtherapeutic on admission and is remains low today at 1.16- INR has been very slow to rise. She is also receiving Lovenox for bridging.  SCr 1.06 with est CrCL ~40-71mL/min. CBC is WNL and stable, no bleeding noted.  Note patient is on steroids and doxycycline both of which can potentiate INR.  Goal of Therapy:  INR 2-3 Monitor platelets by anticoagulation protocol: Yes   Plan:  -Warfarin 10mg  po x1 tonight -Lovenox 100mg  subQ q12h -Continue daily PT/INR -CBC at least q72h while on Lovenox -Monitor for s/sx of bleeding.   Toby Ayad D. Sevyn Markham, PharmD, BCPS Clinical Pharmacist Pager: [phone] 01/10/2015 9:57 AM

## 2015-01-10 NOTE — Progress Notes (Signed)
Patient ID: [name] Case, female   DOB: Mar 23, 1932, 81 y.o.   MRN: [id number] [facility] PROGRESS NOTE  [name] Case [id number]:[id number] DOB: [birth year] DOA: 01/06/2015 PCP: Nyoka Cowden, MD  Brief narrative:    81 y.o. female with past medical history of asthma, hypertension, hypothyroidism, recent diagnosis of CHF and atrial fibrillation (was put on lasix and coumadin by PCP). She presented to [facility] ED with worsening shortness of breath over past 24 hours prior to this admission. Apparently per family on the admission, pt was starting to turn blue and on EMS arrival she was placed on [place] oxygen support and was given IV solumedrol.  In ED, patient's BP was 95/30 but improved to 127/29 and 119/46 subsequently. Her blood work was notable for WBC count 11.7, potassium 3.0, creatinine 1.08, normal troponi level. BNP was WNL. The 12 lead EKG showed sinus rhythm with supraventricular bigeminy. CXR showed no acute cardiopulmonary process. She was admitted for further evaluation of possible CHF exacerbation.   Assessment/Plan:    Acute respiratory failure with hypoxia secondary to tracheobronchitis. -slowly improving -will now stop lasix ; patient has had good diuresis and is currently compensated -will started on doxycycline,  flutter valve, prednisone, pulmicort and duonebs -follow clinical response -Still needs O2 supplementation -echo results with EF is preserved, grade 1 diastolic HF; no wall motion abnormalities -Per exam and x-ray findings patient appears to have tracheobronchitis, continue current treatment change doxycycline to levofloxacin.  Hypothyroidism - Continue synthroid  Essential hypertension -Continue home medications including Norvasc and metoprolol, Lasix discontinued. -BP is well controlled  Acute renal failure: due to diuresis -Likely secondary to benazepril which was placed on hold and diuresis.  -Follow renal function -will stop lasix for now    Atrial fibrillation, chronic - CHADS vasc score at least 4 - Rate controlled with metoprolol.  - On anticoagulation with coumadin and Lovenox until INR at therapeutic level. - I'll start patient on levofloxacin, watch INR closely at that might interact with the Coumadin.  Leukocytosis - Likely reactive and possibly from Solu-Medrol given by EMS. -patient now with productive cough and rhonchi/wheezing on exam -WBC's trending down -anticipate further el;eavtion as she is back on prednisone   Diabetes with Hyperglycemia -A1C 6.6, this qualifies her for diabetes, will talk to her about either control with diet or metformin. -while inpatient will continue SSI and modified carb diet  DVT Prophylaxis  - Lovenox and Coumadin until INR therapeutic.  Code Status: Full.  Family Communication:  plan of care discussed with the patient and granddaughter over the phone Disposition Plan: Continue to be inpatient.  IV access:  Peripheral IV  Procedures and diagnostic studies:    Dg Chest 2 View 01/06/2015  No active cardiopulmonary disease.   Electronically Signed   By: Inez Catalina M.D.   On: 01/06/2015 21:49   2-D echo - Left ventricle: The cavity size was normal. There was moderate concentric hypertrophy. Systolic function was normal. The estimated ejection fraction was in the range of 50% to 55%. Wall motion was normal; there were no regional wall motion abnormalities.  Medical Consultants:  Cardiology  Other Consultants:  Physical therapy   IAnti-Infectives:   None    Yuma Pacella A, MD  [facility] Pager [phone]  Time spent in minutes: 25 minutes  If 7PM-7AM, please contact night-coverage [email or web address] Password [email or web address] 01/10/2015, 4:05 PM   LOS: 3 days    HPI/Subjective: Feeling better and breathing slightly easier; still SOB and requiring O2 supplementation. Denies CP.  Objective: Filed Vitals:   01/10/15 0506 01/10/15 0954 01/10/15 1000 01/10/15  1219  BP: 115/46  131/48 129/41  Pulse: 58  66 66  Temp: 97.4 F (36.3 C)  98 F (36.7 C) 98.2 F (36.8 C)  TempSrc: Oral  Oral Oral  Resp: 16  18 17   Height:      Weight:      SpO2: 96% 98% 98% 97%    Intake/Output Summary (Last 24 hours) at 01/10/15 1605 Last data filed at 01/10/15 1504  Gross per 24 hour  Intake   1280 ml  Output   1752 ml  Net   -472 ml    Exam:   General:  Pt is alert, no CP and no orthopnea. Still complaining of SOB and now with productive cough.  Cardiovascular: Rate control, appreciate S1-S2; no rubs o rgallops  Respiratory: positive exp wheezing, scattered rhonchi; no crackles  Abdomen: Nontender, nondistended abdomen, appreciate bowel sounds  Extremities: Pulses palpable, no cyanosis  Neuro: No focal deficits.  Data Reviewed: Basic Metabolic Panel:  Recent Labs Lab 01/06/15 2058 01/07/15 0141 01/07/15 0335 01/08/15 1145 01/10/15 1038  NA 134* 139  --  140 138  K 3.0* 3.6  --  4.2 4.2  CL 98* 99*  --  101 93*  CO2 29 29  --  32 33*  GLUCOSE 227* 288*  --  86 147*  BUN 18 21*  --  32* 28*  CREATININE 1.08* 1.11*  --  1.07* 0.91  CALCIUM 8.3* 8.6*  --  8.6* 9.4  MG  --   --  1.8  --   --    CBC:  Recent Labs Lab 01/06/15 2058 01/07/15 0141 01/08/15 0438 01/09/15 0552 01/10/15 0411  WBC 11.7* 11.3* 20.3* 10.8* 14.0*  NEUTROABS 9.7*  --   --   --   --   HGB 12.3 12.2 12.1 12.0 12.0  HCT 37.4 37.0 38.1 38.6 37.4  MCV 97.1 96.9 98.2 99.2 97.7  PLT 256 254 279 293 313   Cardiac Enzymes:  Recent Labs Lab 01/06/15 2058  TROPONINI <0.03   CBG:  Recent Labs Lab 01/09/15 1133 01/09/15 1640 01/09/15 2149 01/10/15 0737 01/10/15 1215  GLUCAP 128* 162* 181* 131* 165*    Scheduled Meds: . amLODipine  5 mg Oral Daily  . aspirin EC  325 mg Oral Daily  . furosemide  80 mg Intravenous Q12H  . heparin  5,000 Units Subcutaneous 3 times per day  . insulin aspart  0-5 Units Subcutaneous QHS  . insulin aspart  0-9 Units  Subcutaneous TID WC  . ipratropium-albuterol  3 mL Nebulization BID  . levothyroxine  100 mcg Oral QAC breakfast  . metoprolol succinate  25 mg Oral Daily  . potassium chloride  20 mEq Oral BID  . warfarin  5 mg Oral ONCE-1800

## 2015-01-11 DIAGNOSIS — E785 Hyperlipidemia, unspecified: Secondary | ICD-10-CM

## 2015-01-11 LAB — PROTIME-INR
INR: 1.51 — ABNORMAL HIGH (ref 0.00–1.49)
Prothrombin Time: 18.3 seconds — ABNORMAL HIGH (ref 11.6–15.2)

## 2015-01-11 LAB — BASIC METABOLIC PANEL
ANION GAP: 8 (ref 5–15)
BUN: 28 mg/dL — ABNORMAL HIGH (ref 6–20)
CHLORIDE: 100 mmol/L — AB (ref 101–111)
CO2: 32 mmol/L (ref 22–32)
Calcium: 9.6 mg/dL (ref 8.9–10.3)
Creatinine, Ser: 0.85 mg/dL (ref 0.44–1.00)
Glucose, Bld: 136 mg/dL — ABNORMAL HIGH (ref 65–99)
Potassium: 4.7 mmol/L (ref 3.5–5.1)
Sodium: 140 mmol/L (ref 135–145)

## 2015-01-11 LAB — GLUCOSE, CAPILLARY
GLUCOSE-CAPILLARY: 212 mg/dL — AB (ref 65–99)
Glucose-Capillary: 115 mg/dL — ABNORMAL HIGH (ref 65–99)
Glucose-Capillary: 118 mg/dL — ABNORMAL HIGH (ref 65–99)
Glucose-Capillary: 202 mg/dL — ABNORMAL HIGH (ref 65–99)
Glucose-Capillary: 173 mg/dL — ABNORMAL HIGH (ref 65–99)

## 2015-01-11 LAB — CBC
HEMATOCRIT: 37.1 % (ref 36.0–46.0)
HEMOGLOBIN: 11.8 g/dL — AB (ref 12.0–15.0)
MCH: 31.1 pg (ref 26.0–34.0)
MCHC: 31.8 g/dL (ref 30.0–36.0)
MCV: 97.6 fL (ref 78.0–100.0)
Platelets: 311 10*3/uL (ref 150–400)
RBC: 3.8 MIL/uL — ABNORMAL LOW (ref 3.87–5.11)
RDW: 13.2 % (ref 11.5–15.5)
WBC: 15.3 10*3/uL — ABNORMAL HIGH (ref 4.0–10.5)

## 2015-01-11 MED ORDER — WARFARIN SODIUM 7.5 MG PO TABS
7.5000 mg | ORAL_TABLET | Freq: Once | ORAL | Status: AC
  Administered 2015-01-11: 7.5 mg via ORAL
  Filled 2015-01-11: qty 1

## 2015-01-11 MED ORDER — METHYLPREDNISOLONE SODIUM SUCC 125 MG IJ SOLR
80.0000 mg | Freq: Three times a day (TID) | INTRAMUSCULAR | Status: DC
  Administered 2015-01-11 – 2015-01-12 (×4): 80 mg via INTRAVENOUS
  Filled 2015-01-11 (×2): qty 2
  Filled 2015-01-11 (×3): qty 1.28
  Filled 2015-01-11: qty 2

## 2015-01-11 MED ORDER — IPRATROPIUM-ALBUTEROL 0.5-2.5 (3) MG/3ML IN SOLN
3.0000 mL | Freq: Four times a day (QID) | RESPIRATORY_TRACT | Status: DC
  Administered 2015-01-11 – 2015-01-12 (×2): 3 mL via RESPIRATORY_TRACT
  Filled 2015-01-11 (×3): qty 3

## 2015-01-11 NOTE — Progress Notes (Signed)
Patient ID: [name] Case, female   DOB: 1932/07/17, 81 y.o.   MRN: [id number] [facility] PROGRESS NOTE  [name] Case GMW:[id number] DOB: 03-31-1932 DOA: 01/06/2015 PCP: Nyoka Cowden, MD    HPI/Subjective: Seen with her pastor at bedside, 6 complaining about severe cough, requiring O2 supplements.  Brief narrative:    81 y.o. female with past medical history of asthma, hypertension, hypothyroidism, recent diagnosis of CHF and atrial fibrillation (was put on lasix and coumadin by PCP). She presented to [facility] ED with worsening shortness of breath over past 24 hours prior to this admission. Apparently per family on the admission, pt was starting to turn blue and on EMS arrival she was placed on [place] oxygen support and was given IV solumedrol.  In ED, patient's BP was 95/30 but improved to 127/29 and 119/46 subsequently. Her blood work was notable for WBC count 11.7, potassium 3.0, creatinine 1.08, normal troponi level. BNP was WNL. The 12 lead EKG showed sinus rhythm with supraventricular bigeminy. CXR showed no acute cardiopulmonary process. She was admitted for further evaluation of possible CHF exacerbation.   Assessment/Plan:    Acute respiratory failure with hypoxia secondary to tracheobronchitis. -slowly improving -will now stop lasix ; patient has had good diuresis and is currently compensated -follow clinical response -Still negative at 2 L of oxygen. -Echo showed normal LV EF, cardiology recommended to stop the IV diuresis. -Per exam and x-ray findings patient appears to have tracheobronchitis, continue levofloxacin.  Hypothyroidism - Continue synthroid, TSH is 1.090.  Essential hypertension -Continue home medications including Norvasc and metoprolol. -BP is well controlled  Acute renal failure: due to diuresis -Likely secondary to benazepril and diuresis. -Diuretics discontinued, creatinine reached a peak of 1.1, currently 0.8.  Atrial fibrillation,  chronic - CHADS vasc score at least 4 - Rate controlled with metoprolol.  - On anticoagulation with coumadin and Lovenox until INR at therapeutic level. - Continue Coumadin for now, I will discuss with her to switch to novel anticoagulants as she does not have MS, mitral prolapse or mechanical valve.  Leukocytosis -Likely secondary to the acute tracheobronchitis, plus or minus the effect of steroids.  Diabetes with Hyperglycemia -A1C 6.6, this qualifies her for diabetes, will talk to her about either control with diet or metformin. -while inpatient will continue SSI and modified carb diet  DVT Prophylaxis  - Lovenox and Coumadin until INR therapeutic.  Code Status: Full.  Family Communication:  plan of care discussed with the patient and granddaughter over the phone Disposition Plan: Continue to be inpatient.  IV access:  Peripheral IV  Procedures and diagnostic studies:    Dg Chest 2 View 01/06/2015  No active cardiopulmonary disease.   Electronically Signed   By: Inez Catalina M.D.   On: 01/06/2015 21:49   2-D echo - Left ventricle: The cavity size was normal. There was moderate concentric hypertrophy. Systolic function was normal. The estimated ejection fraction was in the range of 50% to 55%. Wall motion was normal; there were no regional wall motion abnormalities.  Medical Consultants:  Cardiology  Other Consultants:  Physical therapy   IAnti-Infectives:   None    Devlon Dosher A, MD  [facility] Pager [phone]  Time spent in minutes: 25 minutes  If 7PM-7AM, please contact night-coverage [email or web address] Password [email or web address] 01/11/2015, 3:58 PM   LOS: 4 days     Objective: Filed Vitals:   01/11/15 0510 01/11/15 1003 01/11/15 1016 01/11/15 1151  BP: 123/47  141/46   Pulse: 62 63 61  60  Temp: 97.6 F (36.4 C)  98.6 F (37 C)   TempSrc: Oral  Oral   Resp: 20 20 20 20   Height:      Weight:      SpO2: 94% 94% 94% 94%    Intake/Output Summary  (Last 24 hours) at 01/11/15 1558 Last data filed at 01/11/15 1330  Gross per 24 hour  Intake   1130 ml  Output    701 ml  Net    429 ml    Exam:   General:  Pt is alert, no CP and no orthopnea. Still complaining of SOB and now with productive cough.  Cardiovascular: Rate control, appreciate S1-S2; no rubs o rgallops  Respiratory: positive exp wheezing, scattered rhonchi; no crackles  Abdomen: Nontender, nondistended abdomen, appreciate bowel sounds  Extremities: Pulses palpable, no cyanosis  Neuro: No focal deficits.  Data Reviewed: Basic Metabolic Panel:  Recent Labs Lab 01/06/15 2058 01/07/15 0141 01/07/15 0335 01/08/15 1145 01/10/15 1038 01/11/15 0545  NA 134* 139  --  140 138 140  K 3.0* 3.6  --  4.2 4.2 4.7  CL 98* 99*  --  101 93* 100*  CO2 29 29  --  32 33* 32  GLUCOSE 227* 288*  --  86 147* 136*  BUN 18 21*  --  32* 28* 28*  CREATININE 1.08* 1.11*  --  1.07* 0.91 0.85  CALCIUM 8.3* 8.6*  --  8.6* 9.4 9.6  MG  --   --  1.8  --   --   --    CBC:  Recent Labs Lab 01/06/15 2058 01/07/15 0141 01/08/15 0438 01/09/15 0552 01/10/15 0411 01/11/15 0545  WBC 11.7* 11.3* 20.3* 10.8* 14.0* 15.3*  NEUTROABS 9.7*  --   --   --   --   --   HGB 12.3 12.2 12.1 12.0 12.0 11.8*  HCT 37.4 37.0 38.1 38.6 37.4 37.1  MCV 97.1 96.9 98.2 99.2 97.7 97.6  PLT 256 254 279 293 313 311   Cardiac Enzymes:  Recent Labs Lab 01/06/15 2058  TROPONINI <0.03   CBG:  Recent Labs Lab 01/10/15 1215 01/10/15 1650 01/10/15 2105 01/11/15 0729 01/11/15 1130  GLUCAP 165* 146* 205* 115* 118*    Scheduled Meds: . amLODipine  5 mg Oral Daily  . aspirin EC  325 mg Oral Daily  . furosemide  80 mg Intravenous Q12H  . heparin  5,000 Units Subcutaneous 3 times per day  . insulin aspart  0-5 Units Subcutaneous QHS  . insulin aspart  0-9 Units Subcutaneous TID WC  . ipratropium-albuterol  3 mL Nebulization BID  . levothyroxine  100 mcg Oral QAC breakfast  . metoprolol  succinate  25 mg Oral Daily  . potassium chloride  20 mEq Oral BID  . warfarin  5 mg Oral ONCE-1800

## 2015-01-11 NOTE — Progress Notes (Signed)
Physical Therapy Treatment Note  Clinical Impression: SATURATION QUALIFICATIONS: (This note is used to comply with regulatory documentation for home oxygen)  Patient Saturations on Room Air at Rest = 93%  Patient Saturations on Room Air while Ambulating = 86%  Patient Saturations on 2 Liters of oxygen while Ambulating = 95%  Please briefly explain why patient needs home oxygen: Patient requires supplemental oxygen to maintain oxygen saturations at acceptable, safe levels with physical activity.  Full PT eval note to follow;  Roney Marion, Claude Pager [phone] Office [phone]

## 2015-01-11 NOTE — Evaluation (Signed)
Physical Therapy Evaluation Patient Details Name: [name] Case MRN: [id number] DOB: 1931/10/24 Today's Date: 01/11/2015   History of Present Illness  Admitted with respiratory distress  Past Medical History  Diagnosis Date  . Allergy   . Asthma   . Hyperlipidemia   . Hypertension   . Thyroid disease   . GERD (gastroesophageal reflux disease)   . CHF (congestive heart failure)   . Bronchitis   . Atrial fibrillation    Past Surgical History  Procedure Laterality Date  . Appendectomy    . Cholecystectomy    . Abdominal hysterectomy    . Total knee arthroplasty       Clinical Impression  Pt admitted with above diagnosis. Pt currently with functional limitations due to the deficits listed below (see PT Problem List).  Pt will benefit from skilled PT to increase their independence and safety with mobility to allow discharge to the venue listed below.       Follow Up Recommendations Home health PT    Equipment Recommendations  Rolling walker with 5" wheels    Recommendations for Other Services OT consult     Precautions / Restrictions Precautions Precaution Comments: monitor O2 sats with amb on Room Air      Mobility  Bed Mobility Overal bed mobility: Needs Assistance Bed Mobility: Supine to Sit     Supine to sit: Min assist     General bed mobility comments: min assist to pull to sit  Transfers Overall transfer level: Needs assistance Equipment used: Rolling walker (2 wheeled) Transfers: Sit to/from Stand Sit to Stand: Min guard         General transfer comment: minguard for steadiness  Ambulation/Gait Ambulation/Gait assistance: Min guard;Min assist Ambulation Distance (Feet): 150 Feet Assistive device: Rolling walker (2 wheeled);None Gait Pattern/deviations: Step-through pattern Gait velocity: slowed   General Gait Details: Benefits from having bilateral UE support on RW; noted 2-3 small losses of balance needing min assist to  recover  Stairs            Wheelchair Mobility    Modified Rankin (Stroke Patients Only)       Balance Overall balance assessment: Needs assistance           Standing balance-Leahy Scale: Poor Standing balance comment: heavy reliance on UE suport with amb                             Pertinent Vitals/Pain Pain Assessment: No/denies pain    Home Living Family/patient expects to be discharged to:: Private residence Living Arrangements: Alone Available Help at Discharge: Family;Available PRN/intermittently (will be alone a few hours a day; grand children sleep over) Type of Home: House Home Access: Ramped entrance     Home Layout: One level Home Equipment: Walker - 2 wheels      Prior Function Level of Independence: Independent with assistive device(s)         Comments: Rw prn; has used it more here recently     Hand Dominance        Extremity/Trunk Assessment   Upper Extremity Assessment: Overall WFL for tasks assessed           Lower Extremity Assessment: Generalized weakness         Communication   Communication: No difficulties  Cognition Arousal/Alertness: Awake/alert Behavior During Therapy: WFL for tasks assessed/performed Overall Cognitive Status: Within Functional Limits for tasks assessed  General Comments General comments (skin integrity, edema, etc.): See other PT note of this date for oxygen qualifying walk    Exercises        Assessment/Plan    PT Assessment Patient needs continued PT services  PT Diagnosis Difficulty walking;Generalized weakness   PT Problem List Decreased strength;Decreased range of motion;Decreased activity tolerance;Decreased balance;Decreased mobility;Decreased knowledge of use of DME;Cardiopulmonary status limiting activity  PT Treatment Interventions DME instruction;Gait training;Therapeutic activities;Functional mobility training;Therapeutic  exercise;Balance training;Patient/family education   PT Goals (Current goals can be found in the Care Plan section) Acute Rehab PT Goals Patient Stated Goal: hopes to get home soon PT Goal Formulation: With patient Time For Goal Achievement: 01/25/15    Frequency Min 3X/week   Barriers to discharge        Co-evaluation               End of Session Equipment Utilized During Treatment: Gait belt;Oxygen Activity Tolerance: Patient tolerated treatment well Patient left: in chair;with call bell/phone within reach Nurse Communication: Mobility status         Time: 7654-6503 PT Time Calculation (min) (ACUTE ONLY): 28 min   Charges:   PT Evaluation $Initial PT Evaluation Tier I: 1 Procedure PT Treatments $Gait Training: 8-22 mins   PT G Codes:        Quin Hoop 01/11/2015, 1:32 PM  Roney Marion, PT  Acute Rehabilitation Services Pager [phone] Office [phone]

## 2015-01-11 NOTE — Progress Notes (Signed)
Patient desat while ambulating with PT 85% on roomair

## 2015-01-11 NOTE — Progress Notes (Signed)
ANTICOAGULATION CONSULT NOTE - Follow Up Consult  Pharmacy Consult for warfarin and Lovenox Indication: atrial fibrillation  Allergies  Allergen Reactions  . Codeine Hives and Rash    In cough syrup preparations    Patient Measurements: Height: 5\' 1"  (154.9 cm) Weight: 218 lb 1.6 oz (98.93 kg) IBW/kg (Calculated) : 47.8  Vital Signs: Temp: 98.6 F (37 C) (05/12 1016) Temp Source: Oral (05/12 1016) BP: 141/46 mmHg (05/12 1016) Pulse Rate: 61 (05/12 1016)  Labs:  Recent Labs  01/08/15 1145  01/09/15 0552 01/10/15 0411 01/10/15 1038 01/11/15 0545  HGB  --   < > 12.0 12.0  --  11.8*  HCT  --   --  38.6 37.4  --  37.1  PLT  --   --  293 313  --  311  LABPROT  --   --  14.4 14.9  --  18.3*  INR  --   --  1.11 1.16  --  1.51*  CREATININE 1.07*  --   --   --  0.91 0.85  < > = values in this interval not displayed.  Estimated Creatinine Clearance: 55.9 mL/min (by C-G formula based on Cr of 0.85).   Assessment: 81 year old female on chronic anticoagulation with Coumadin for atrial fibrillation. Her INR was subtherapeutic on admission and has been very slow to rise. She received an increase dose of 7.5mg  on 5/10 and another increased dose of 10mg  last evening. INR this morning up to 1.5- this rise is more d/t 7.5mg  dose, so anticipate 10mg  dose from yesterday to further increase INR in the morning.  SCr 0.8 with est CrCL ~50-37mL/min.Hgb with slight drop, plts WNL- overall stable, no bleeding noted.  Note patient is on steroids and was on doxycycline, now switched to Levaquin- all of these can potentiate INR.  PTA coumadin: 2.5mg  daily per patient  Goal of Therapy:  INR 2-3 Monitor platelets by anticoagulation protocol: Yes   Plan:  -Warfarin 7.5mg  po x1 tonight -Lovenox 100mg  subQ q12h -Continue daily PT/INR -CBC at least q72h while on Lovenox -Monitor for s/sx of bleeding.   Deidrea Gaetz D. Takelia Urieta, PharmD, BCPS Clinical Pharmacist Pager: [phone] 01/11/2015 10:43  AM

## 2015-01-12 LAB — BASIC METABOLIC PANEL
Anion gap: 11 (ref 5–15)
BUN: 28 mg/dL — AB (ref 6–20)
CO2: 31 mmol/L (ref 22–32)
CREATININE: 1.02 mg/dL — AB (ref 0.44–1.00)
Calcium: 9.4 mg/dL (ref 8.9–10.3)
Chloride: 96 mmol/L — ABNORMAL LOW (ref 101–111)
GFR calc Af Amer: 58 mL/min — ABNORMAL LOW (ref >60)
GFR, EST NON AFRICAN AMERICAN: 50 mL/min — AB (ref >60)
Glucose, Bld: 193 mg/dL — ABNORMAL HIGH (ref 65–99)
POTASSIUM: 4.3 mmol/L (ref 3.5–5.1)
Sodium: 138 mmol/L (ref 135–145)

## 2015-01-12 LAB — CBC
HCT: 39.2 % (ref 36.0–46.0)
Hemoglobin: 12.4 g/dL (ref 12.0–15.0)
MCH: 30.9 pg (ref 26.0–34.0)
MCHC: 31.6 g/dL (ref 30.0–36.0)
MCV: 97.8 fL (ref 78.0–100.0)
Platelets: 320 10*3/uL (ref 150–400)
RBC: 4.01 MIL/uL (ref 3.87–5.11)
RDW: 13.3 % (ref 11.5–15.5)
WBC: 12.7 10*3/uL — ABNORMAL HIGH (ref 4.0–10.5)

## 2015-01-12 LAB — GLUCOSE, CAPILLARY
Glucose-Capillary: 174 mg/dL — ABNORMAL HIGH (ref 65–99)
Glucose-Capillary: 146 mg/dL — ABNORMAL HIGH (ref 65–99)
Glucose-Capillary: 170 mg/dL — ABNORMAL HIGH (ref 65–99)

## 2015-01-12 LAB — PROTIME-INR
INR: 1.74 — AB (ref 0.00–1.49)
Prothrombin Time: 20.5 seconds — ABNORMAL HIGH (ref 11.6–15.2)

## 2015-01-12 MED ORDER — PREDNISONE 10 MG (21) PO TBPK: ORAL_TABLET | ORAL | Status: DC

## 2015-01-12 MED ORDER — APIXABAN 5 MG PO TABS: 5.0000 mg | ORAL_TABLET | Freq: Two times a day (BID) | ORAL | Status: DC

## 2015-01-12 MED ORDER — IPRATROPIUM-ALBUTEROL 0.5-2.5 (3) MG/3ML IN SOLN
3.0000 mL | RESPIRATORY_TRACT | Status: DC | PRN
Start: 2015-01-12 — End: 2015-01-12

## 2015-01-12 MED ORDER — GUAIFENESIN ER 600 MG PO TB12: 1200.0000 mg | ORAL_TABLET | Freq: Two times a day (BID) | ORAL | Status: DC | PRN

## 2015-01-12 MED ORDER — LIVING WELL WITH DIABETES BOOK
Freq: Once | Status: DC
  Filled 2015-01-12: qty 1

## 2015-01-12 MED ORDER — WARFARIN SODIUM 7.5 MG PO TABS
7.5000 mg | ORAL_TABLET | Freq: Once | ORAL | Status: DC
  Filled 2015-01-12: qty 1

## 2015-01-12 MED ORDER — METFORMIN HCL 500 MG PO TABS: 500.0000 mg | ORAL_TABLET | Freq: Two times a day (BID) | ORAL | Status: DC

## 2015-01-12 MED ORDER — LEVOFLOXACIN 750 MG PO TABS: 750.0000 mg | ORAL_TABLET | ORAL | Status: DC

## 2015-01-12 NOTE — Progress Notes (Signed)
Inpatient Diabetes Program Recommendations  AACE/ADA: New Consensus Statement on Inpatient Glycemic Control (2013)  Target Ranges:  Prepandial:   less than 140 mg/dL      Peak postprandial:   less than 180 mg/dL (1-2 hours)      Critically ill patients:  140 - 180 mg/dL   Referral received for 'new diabetic'. Ordered dietician consult for education with pt being d/c'd today if time allowed at this point.. If pt to get [facility] services, please include in their orders to assist with nutritional choices and portions.  Ordered the dm ed'l booklet, exit care handouts on nutrition. Can also order OP education at the [facility] if desired or can be referred by primary MD. The HgbA1C can be falsely high due to elevations even over the past week and high doses of steroid therapy has definitely influenced the hyperglycemia while here (although it is an avg of the past 2-3 months glucose, the 1-2 week prior glucose levels can influence the A1C)  Noted Metformin to be started as well. BUN/Creat presently WNL's for Metformin therapy. Will get exit care handouts prepared at this time.  Thank you Rosita Kea, RN, MSN, CDE  Diabetes Inpatient Program Office: [phone] Pager: [phone] 8:00 am to 5:00 pm

## 2015-01-12 NOTE — Progress Notes (Signed)
ANTICOAGULATION CONSULT NOTE - Follow Up Consult  Pharmacy Consult for warfarin and Lovenox Indication: atrial fibrillation  Allergies  Allergen Reactions  . Codeine Hives and Rash    In cough syrup preparations    Patient Measurements: Height: 5\' 1"  (154.9 cm) Weight: 220 lb 11.2 oz (100.109 kg) IBW/kg (Calculated) : 47.8  Vital Signs: Temp: 98.2 F (36.8 C) (05/13 0421) Temp Source: Oral (05/13 0421) BP: 128/47 mmHg (05/13 0421) Pulse Rate: 59 (05/13 0421)  Labs:  Recent Labs  01/10/15 0411 01/10/15 1038 01/11/15 0545 01/12/15 0502  HGB 12.0  --  11.8* 12.4  HCT 37.4  --  37.1 39.2  PLT 313  --  311 320  LABPROT 14.9  --  18.3* 20.5*  INR 1.16  --  1.51* 1.74*  CREATININE  --  0.91 0.85 1.02*    Estimated Creatinine Clearance: 46.9 mL/min (by C-G formula based on Cr of 1.02).   Assessment: 81 year old female on chronic anticoagulation with Coumadin for atrial fibrillation. Her INR was subtherapeutic on admission and has been very slow to rise. INR this morning now almost at goal at 1.74.  SCr increased to 1.02 with est CrCL ~45-21mL/min.Hgb and plts WNL and stable. No bleeding noted.  Note patient is on steroids and was on doxycycline, now switched to Levaquin- all of these can potentiate INR.  PTA coumadin: 2.5mg  daily per patient  Goal of Therapy:  INR 2-3 Monitor platelets by anticoagulation protocol: Yes   Plan:  -Warfarin 7.5mg  po x1 tonight -Lovenox 100mg  subQ q12h -Continue daily PT/INR -CBC at least q72h while on Lovenox -Monitor for s/sx of bleeding.   Theresa Wedel D. Mare Ludtke, PharmD, BCPS Clinical Pharmacist Pager: [phone] 01/12/2015 8:35 AM

## 2015-01-12 NOTE — Discharge Summary (Signed)
Physician Discharge Summary  [name] Case IDP:[id number] DOB: 09-16-1931 DOA: 01/06/2015  PCP: Nyoka Cowden, MD  Admit date: 01/06/2015 Discharge date: 01/12/2015  Time spent: 40 minutes  Recommendations for Outpatient Follow-up:  1. Follow-up with primary care physician in one week. 2. New medications include metformin 500 mg twice a day, Eliquis 5 mg twice a day. Discontinued warfarin and Norvasc. 3. For her bronchitis levofloxacin and prednisone taper  Discharge Diagnoses:  Principal Problem:   Acute respiratory distress Active Problems:   Hypothyroidism   Hyperlipidemia   Essential hypertension   Acute upper respiratory infection   Asthma   Dyspnea   Acute diastolic CHF (congestive heart failure)   Bronchospasm   Atrial fibrillation   Warfarin-induced coagulopathy   Type 2 diabetes mellitus with hyperglycemia   Acute bronchitis   Discharge Condition: Stable  Diet recommendation: Heart healthy  Filed Weights   01/10/15 0500 01/10/15 2005 01/11/15 2009  Weight: 91.53 kg (201 lb 12.6 oz) 98.93 kg (218 lb 1.6 oz) 100.109 kg (220 lb 11.2 oz)    History of present illness:  [name] Case is a 81 y.o. female who presents to the ED after having an episode of respiratory distress in her home after eating with her family for their Mother's day celebration. Her lips and hands began to turn blue, and she was unable to lay flat because she could not breathe, EMS was called, and she was placed on NCO2 and on route she was observed to have wheezing and was administered 1 dose of IV Solumedrol.   She has had increased SOB and Coughing and Wheezing for over 4 weeks, Her Cough has been non-Productive. She has been treated by her PCP initially for Acute Bronchitis with antibiotics and several rounds of prednisone tapers. She was sent for imaging to [place] and was told that she had Congestive Heart Failure, and then had followup with her PCP and was  diagnosed on Monday 01/01/2015 with Atrial fibrillation and started on coumadin as well as Furosemide, and ? Metoprolol per the daughter's report. She continued to have SOB, DOE and Orthopnea as well as wheezing for the next week.   Hospital Course:   Acute respiratory failure with hypoxia secondary to tracheobronchitis. -Patient presented with shortness of breath, coughing and wheezing for the past 4 weeks. -Required at least 2 L of oxygen to keep her oxygen saturation above 90%. -Initially she was diuresed with good output, creatinine start to rise so IV Lasix was discontinued. -Echo showed normal LV EF, cardiology recommended to stop the IV diuresis. -Per exam and x-ray findings patient appears to have tracheobronchitis, started on levofloxacin. -On discharge discharge levofloxacin, steroid taper and oxygen.  Hypothyroidism - Continue synthroid, TSH is 1.090.  Essential hypertension -Continue home medications including Norvasc and metoprolol. -BP is well controlled  Acute renal failure: due to diuresis -Likely secondary to benazepril and diuresis. -Diuretics discontinued, creatinine reached a peak of 1.1, currently 0.8.  Atrial fibrillation, chronic - CHADS vasc score at least 4 - Rate controlled with metoprolol.  - Wasn't Coumadin on admission, discharged on Eliquis 5 mg twice a day.  Leukocytosis -Likely secondary to the acute tracheobronchitis, plus or minus the effect of steroids.  Diabetes mellitus type 2 -Patient reported that she is being borderline diabetic for the past one year, hemoglobin A1c is 6.6. -Patient started on metformin 500 mg twice a day, instructed to consume carbohydrate modified diet.   Procedures:  None  Consultations:  Cardiology   Discharge  Exam: Filed Vitals:   01/12/15 0937  BP: 138/52  Pulse: 60  Temp: 97.7 F (36.5 C)  Resp: 18   General: Alert and awake, oriented x3, not in any acute distress. HEENT: anicteric sclera, pupils  reactive to light and accommodation, EOMI CVS: S1-S2 clear, no murmur rubs or gallops Chest: clear to auscultation bilaterally, no wheezing, rales or rhonchi Abdomen: soft nontender, nondistended, normal bowel sounds, no organomegaly Extremities: no cyanosis, clubbing or edema noted bilaterally Neuro: Cranial nerves II-XII intact, no focal neurological deficits  Discharge Instructions   Discharge Instructions    Diet - low sodium heart healthy    Complete by:  As directed      Increase activity slowly    Complete by:  As directed           Current Discharge Medication List    START taking these medications   Details  apixaban (ELIQUIS) 5 MG TABS tablet Take 1 tablet (5 mg total) by mouth 2 (two) times daily. Qty: 60 tablet, Refills: 0    guaiFENesin (MUCINEX) 600 MG 12 hr tablet Take 2 tablets (1,200 mg total) by mouth 2 (two) times daily as needed for cough. Qty: 30 tablet, Refills: 0    levofloxacin (LEVAQUIN) 750 MG tablet Take 1 tablet (750 mg total) by mouth daily. Qty: 5 tablet, Refills: 0    metFORMIN (GLUCOPHAGE) 500 MG tablet Take 1 tablet (500 mg total) by mouth 2 (two) times daily with a meal. Qty: 60 tablet, Refills: 0    predniSONE (STERAPRED UNI-PAK 21 TAB) 10 MG (21) TBPK tablet Take 6-5-4-3-2-1 PO daily till gone Qty: 21 tablet, Refills: 0      CONTINUE these medications which have NOT CHANGED   Details  albuterol (PROVENTIL HFA;VENTOLIN HFA) 108 (90 BASE) MCG/ACT inhaler Inhale 2 puffs into the lungs every 4 (four) hours as needed for wheezing or shortness of breath. Qty: 1 Inhaler, Refills: 0    benazepril (LOTENSIN) 20 MG tablet Take 1 tablet (20 mg total) by mouth daily. Qty: 90 tablet, Refills: 6    levothyroxine (SYNTHROID, LEVOTHROID) 100 MCG tablet Take 1 tablet (100 mcg total) by mouth daily. Qty: 90 tablet, Refills: 6    metoprolol succinate (TOPROL-XL) 25 MG 24 hr tablet Take 25 mg by mouth daily.    albuterol (PROVENTIL) (2.5 MG/3ML)  0.083% nebulizer solution Take 3 mLs (2.5 mg total) by nebulization every 4 (four) hours as needed for wheezing. Qty: 75 mL, Refills: 12    furosemide (LASIX) 40 MG tablet Take 1 tablet (40 mg total) by mouth daily. May take an additional evening dose if legs are swelling Qty: 90 tablet, Refills: 6    HYDROcodone-acetaminophen (NORCO/VICODIN) 5-325 MG per tablet Take 1-2 tablets by mouth every 6 hours as needed for pain. Qty: 15 tablet, Refills: 0    levalbuterol (XOPENEX HFA) 45 MCG/ACT inhaler Inhale 1-2 puffs into the lungs every 4 (four) hours as needed for wheezing. Qty: 1 Inhaler, Refills: 12      STOP taking these medications     amLODipine (NORVASC) 5 MG tablet      warfarin (COUMADIN) 5 MG tablet        Allergies  Allergen Reactions  . Codeine Hives and Rash    In cough syrup preparations      The results of significant diagnostics from this hospitalization (including imaging, microbiology, ancillary and laboratory) are listed below for reference.    Significant Diagnostic Studies: Dg Chest 2 View  01/10/2015  CLINICAL DATA:  Four day history of shortness of breath and wheezing  EXAM: CHEST  2 VIEW  COMPARISON:  Jan 06, 2015  FINDINGS: There is no edema or consolidation. Heart is upper normal in size with pulmonary vascularity within normal limits. No adenopathy. No bone lesions.  IMPRESSION: No edema or consolidation.   Electronically Signed   By: Lowella Grip III M.D.   On: 01/10/2015 08:07   Dg Chest 2 View  01/06/2015   CLINICAL DATA:  Productive cough and shortness of Breath  EXAM: CHEST  2 VIEW  COMPARISON:  12/29/2014  FINDINGS: Cardiac shadow is mildly enlarged but stable. The lungs are well aerated bilaterally. No focal infiltrate or sizable effusion is seen. No acute bony abnormality is noted.  IMPRESSION: No active cardiopulmonary disease.   Electronically Signed   By: Inez Catalina M.D.   On: 01/06/2015 21:49   Dg Chest 2 View  12/29/2014   CLINICAL  DATA:  Cough, wheezing, and shortness of breath for the past 2 weeks  EXAM: CHEST  2 VIEW  COMPARISON:  PA and lateral chest x-ray of December 12, 2013  FINDINGS: The lungs are adequately inflated. There is no focal infiltrate. There is no pleural effusion or pneumothorax. The cardiac silhouette is mildly enlarged. The central pulmonary vascularity is less distinct today. There is tortuosity of the descending thoracic aorta. The bony thorax exhibits no acute abnormality.  IMPRESSION: Increased prominence of the cardiac silhouette and central pulmonary vascularity is consistent with low-grade CHF. There is no alveolar edema or pneumonia. Follow-up radiographs following anticipated therapy are recommended to assure resolution.  These results will be called to the ordering clinician or representative by the Radiology Department at the imaging location.   Electronically Signed   By: David  Martinique M.D.   On: 12/29/2014 14:49   Dg Bone Density  01/02/2015   CLINICAL DATA:  Postmenopausal.  EXAM: DUAL X-RAY ABSORPTIOMETRY (DXA) FOR BONE MINERAL DENSITY  FINDINGS: LEFT FEMUR NECK  Bone Mineral Density (BMD):  0.937 g/cm2  Young Adult T-Score: 0.8  Z-Score:  3.2  LEFT FOREARM (1/3 RADIUS)  Bone Mineral Density (BMD):  0.674  Young Adult T Score:  -0.3  Z Score:  2.9  ASSESSMENT: Patient's diagnostic category is NORMAL by WHO Criteria.  FRACTURE RISK: NOT INCREASED  FRAX: World Health Organization FRAX assessment of absolute fracture risk is not calculated for this patient because the patient has normal bone density.  COMPARISON: None.  The left forearm was substituted for the lumbar spine due to degenerative changes in the lumbar spine artifactually raising the bone mineral density.  Effective therapies are available in the form of bisphosphonates, selective estrogen receptor modulators, biologic agents, and hormone replacement therapy (for women). All patients should ensure an adequate intake of dietary calcium (1200 mg  daily) and vitamin D (800 IU daily) unless contraindicated.  All treatment decisions require clinical judgment and consideration of individual patient factors, including patient preferences, co-morbidities, previous drug use, risk factors not captured in the FRAX model (e.g., frailty, falls, vitamin D deficiency, increased bone turnover, interval significant decline in bone density) and possible under- or over-estimation of fracture risk by FRAX.  The National Osteoporosis Foundation recommends that FDA-approved medical therapies be considered in postmenopausal women and men age 22 or older with a:  1. Hip or vertebral (clinical or morphometric) fracture.  2. T-score of -2.5 or lower at the spine or hip.  3. Ten-year fracture probability by FRAX of 3% or greater  for hip fracture or 20% or greater for major osteoporotic fracture.  People with diagnosed cases of osteoporosis or at high risk for fracture should have regular bone mineral density tests. For patients eligible for Medicare, routine testing is allowed once every 2 years. The testing frequency can be increased to one year for patients who have rapidly progressing disease, those who are receiving or discontinuing medical therapy to restore bone mass, or have additional risk factors.  World Pharmacologist [facility]) Criteria:  Normal: T-scores from +1.0 to -1.0  Low Bone Mass (Osteopenia): T-scores between -1.0 and -2.5  Osteoporosis: T-scores -2.5 and below  Comparison to Reference Population:  T-score is the key measure used in the diagnosis of osteoporosis and relative risk determination for fracture. It provides a value for bone mass relative to the mean bone mass of a young adult reference population expressed in terms of standard deviation (SD).  Z-score is the age-matched score showing the patient's values compared to a population matched for age, sex, and race. This is also expressed in terms of standard deviation. The patient may have values that  compare favorably to the age-matched values and still be at increased risk for fracture.   Electronically Signed   By: Claudie Revering M.D.   On: 01/02/2015 11:09   US Breast Ltd Uni Left Inc Axilla  01/02/2015   CLINICAL DATA:  Patient has left nipple inversion, which has developed over the last 3-4 months. Last mammography performed over 11 years ago.  EXAM: DIGITAL DIAGNOSTIC BILATERAL MAMMOGRAM WITH 3D TOMOSYNTHESIS WITH CAD  ULTRASOUND LEFT BREAST  COMPARISON:  None.  ACR Breast Density Category a: The breast tissue is almost entirely fatty.  FINDINGS: Left nipple is retracted/inverted. There are no breast masses or areas of architectural distortion. There are no suspicious calcifications. No evidence of malignancy.  Mammographic images were processed with CAD.  On physical exam, there is dimpling superior to the left nipple and retraction of the left nipple, but no underlying mass.  Targeted ultrasound is performed, showing normal fibroglandular and fibro fatty tissue throughout the left breast, focused on the upper left breast and retroareolar region. No mass or cyst.  IMPRESSION: Normal exam.  No evidence of malignancy.  RECOMMENDATION: Screening mammogram in one year.(Code:SM-B-01Y).  Clinical followup for the left breast nipple retraction/ inversion. Given that this has developed over the last 3-4 months, it would be reasonable to obtain a surgical consultation for this patient. It any additional imaging is desired, breast MRI would be recommended which is the most sensitive breast imaging examination for breast carcinoma.  I have discussed the findings and recommendations with the patient. Results were also provided in writing at the conclusion of the visit. If applicable, a reminder letter will be sent to the patient regarding the next appointment.  BI-RADS CATEGORY  1: Negative.   Electronically Signed   By: Lajean Manes M.D.   On: 01/02/2015 12:12   Mm Diag Breast Tomo Bilateral  01/02/2015    CLINICAL DATA:  Patient has left nipple inversion, which has developed over the last 3-4 months. Last mammography performed over 11 years ago.  EXAM: DIGITAL DIAGNOSTIC BILATERAL MAMMOGRAM WITH 3D TOMOSYNTHESIS WITH CAD  ULTRASOUND LEFT BREAST  COMPARISON:  None.  ACR Breast Density Category a: The breast tissue is almost entirely fatty.  FINDINGS: Left nipple is retracted/inverted. There are no breast masses or areas of architectural distortion. There are no suspicious calcifications. No evidence of malignancy.  Mammographic images were processed with  CAD.  On physical exam, there is dimpling superior to the left nipple and retraction of the left nipple, but no underlying mass.  Targeted ultrasound is performed, showing normal fibroglandular and fibro fatty tissue throughout the left breast, focused on the upper left breast and retroareolar region. No mass or cyst.  IMPRESSION: Normal exam.  No evidence of malignancy.  RECOMMENDATION: Screening mammogram in one year.(Code:SM-B-01Y).  Clinical followup for the left breast nipple retraction/ inversion. Given that this has developed over the last 3-4 months, it would be reasonable to obtain a surgical consultation for this patient. It any additional imaging is desired, breast MRI would be recommended which is the most sensitive breast imaging examination for breast carcinoma.  I have discussed the findings and recommendations with the patient. Results were also provided in writing at the conclusion of the visit. If applicable, a reminder letter will be sent to the patient regarding the next appointment.  BI-RADS CATEGORY  1: Negative.   Electronically Signed   By: Lajean Manes M.D.   On: 01/02/2015 12:12    Microbiology: No results found for this or any previous visit (from the past 240 hour(s)).   Labs: Basic Metabolic Panel:  Recent Labs Lab 01/07/15 0141 01/07/15 0335 01/08/15 1145 01/10/15 1038 01/11/15 0545 01/12/15 0502  NA 139  --  140 138 140  138  K 3.6  --  4.2 4.2 4.7 4.3  CL 99*  --  101 93* 100* 96*  CO2 29  --  32 33* 32 31  GLUCOSE 288*  --  86 147* 136* 193*  BUN 21*  --  32* 28* 28* 28*  CREATININE 1.11*  --  1.07* 0.91 0.85 1.02*  CALCIUM 8.6*  --  8.6* 9.4 9.6 9.4  MG  --  1.8  --   --   --   --    Liver Function Tests: No results for input(s): AST, ALT, ALKPHOS, BILITOT, PROT, ALBUMIN in the last 168 hours. No results for input(s): LIPASE, AMYLASE in the last 168 hours. No results for input(s): AMMONIA in the last 168 hours. CBC:  Recent Labs Lab 01/06/15 2058  01/08/15 0438 01/09/15 0552 01/10/15 0411 01/11/15 0545 01/12/15 0502  WBC 11.7*  < > 20.3* 10.8* 14.0* 15.3* 12.7*  NEUTROABS 9.7*  --   --   --   --   --   --   HGB 12.3  < > 12.1 12.0 12.0 11.8* 12.4  HCT 37.4  < > 38.1 38.6 37.4 37.1 39.2  MCV 97.1  < > 98.2 99.2 97.7 97.6 97.8  PLT 256  < > 279 293 313 311 320  < > = values in this interval not displayed. Cardiac Enzymes:  Recent Labs Lab 01/06/15 2058  TROPONINI <0.03   BNP: BNP (last 3 results)  Recent Labs  01/06/15 2058  BNP 71.5    ProBNP (last 3 results) No results for input(s): PROBNP in the last 8760 hours.  CBG:  Recent Labs Lab 01/11/15 1616 01/11/15 2007 01/11/15 2133 01/12/15 0730 01/12/15 1133  GLUCAP 202* 173* 212* 174* 146*       Signed:  Justa Hatchell A  [facility] 01/12/2015, 1:03 PM

## 2015-01-12 NOTE — Progress Notes (Signed)
NURSING PROGRESS NOTE  RAYLINN KOSAR [id number] Discharge Data: 01/12/2015 8:12 PM Attending Provider: No att. providers found IEP:PIRJJOACZYS,AYTKZ Pilar Plate, MD     Zannie Kehr to be D/C'd Home per MD order.  Discussed with the patient the After Visit Summary and all questions fully answered. All IV's discontinued with no bleeding noted. All belongings returned to patient for patient to take home.   Last Vital Signs:  Blood pressure 122/60, pulse 62, temperature 97.8 F (36.6 C), temperature source Oral, resp. rate 18, height 5\' 1"  (1.549 m), weight 100.109 kg (220 lb 11.2 oz), SpO2 96 %.  Discharge Medication List   Medication List    STOP taking these medications        amLODipine 5 MG tablet  Commonly known as:  NORVASC     warfarin 5 MG tablet  Commonly known as:  COUMADIN      TAKE these medications        albuterol 108 (90 BASE) MCG/ACT inhaler  Commonly known as:  PROVENTIL HFA;VENTOLIN HFA  Inhale 2 puffs into the lungs every 4 (four) hours as needed for wheezing or shortness of breath.     albuterol (2.5 MG/3ML) 0.083% nebulizer solution  Commonly known as:  PROVENTIL  Take 3 mLs (2.5 mg total) by nebulization every 4 (four) hours as needed for wheezing.     apixaban 5 MG Tabs tablet  Commonly known as:  ELIQUIS  Take 1 tablet (5 mg total) by mouth 2 (two) times daily.     benazepril 20 MG tablet  Commonly known as:  LOTENSIN  Take 1 tablet (20 mg total) by mouth daily.     furosemide 40 MG tablet  Commonly known as:  LASIX  Take 1 tablet (40 mg total) by mouth daily. May take an additional evening dose if legs are swelling     guaiFENesin 600 MG 12 hr tablet  Commonly known as:  MUCINEX  Take 2 tablets (1,200 mg total) by mouth 2 (two) times daily as needed for cough.     HYDROcodone-acetaminophen 5-325 MG per tablet  Commonly known as:  NORCO/VICODIN  Take 1-2 tablets by mouth every 6 hours as needed for pain.     levalbuterol 45 MCG/ACT inhaler   Commonly known as:  XOPENEX HFA  Inhale 1-2 puffs into the lungs every 4 (four) hours as needed for wheezing.     levofloxacin 750 MG tablet  Commonly known as:  LEVAQUIN  Take 1 tablet (750 mg total) by mouth daily.     levothyroxine 100 MCG tablet  Commonly known as:  SYNTHROID, LEVOTHROID  Take 1 tablet (100 mcg total) by mouth daily.     metFORMIN 500 MG tablet  Commonly known as:  GLUCOPHAGE  Take 1 tablet (500 mg total) by mouth 2 (two) times daily with a meal.     metoprolol succinate 25 MG 24 hr tablet  Commonly known as:  TOPROL-XL  Take 25 mg by mouth daily.     predniSONE 10 MG (21) Tbpk tablet  Commonly known as:  STERAPRED UNI-PAK 21 TAB  Take 6-5-4-3-2-1 PO daily till gone        Patient was given intensive explanation about what meds that are being discontinued (coumadin) and what new meds are being prescribed. Patient was also asked to confirm that she preferred to followup with her primary physician on Monday instead of awaiting homehealth setup. She said she would have PCP to order and arrange for homehealth. Patient  was given several educational handouts about all new meds as well as the Diabetes booklet from pharmacy. She was give a list of meds she had taken today and informed of when the meds were due again. Patient was give a card for a free month's dose of eliquis . Her oxygen tank was deliver and she was knowledgeable of how to safely administer oxygen. All questions were answered and patient and granddaughter did not voice any concerns. Transported down without any distress.

## 2015-01-12 NOTE — Progress Notes (Signed)
Physical Therapy Treatment Patient Details Name: AVANNA SOWDER MRN: [id number] DOB: Mar 08, 1932 Today's Date: 01/12/2015    History of Present Illness Admitted with respiratory distress    PT Comments    Patient   Going home on oxygen, asking when she is to wear. Deferred to RN  At DC.  Follow Up Recommendations  Home health PT (although patient declines)     Equipment Recommendations  None recommended by PT    Recommendations for Other Services       Precautions / Restrictions Precautions Precaution Comments: monitor O2 sats     Mobility  Bed Mobility Overal bed mobility: Modified Independent                Transfers Overall transfer level: Modified independent                  Ambulation/Gait Ambulation/Gait assistance: Supervision;Min guard;Modified independent (Device/Increase time) Ambulation Distance (Feet): 200 Feet Assistive device: None Gait Pattern/deviations: Step-through pattern     General Gait Details: , without assist except to hold to counter, one loss of balance when  patient pushed curtain away.   Stairs            Wheelchair Mobility    Modified Rankin (Stroke Patients Only)       Balance             Standing balance-Leahy Scale: Fair Standing balance comment: no UE support for ambulation in hall.                    Cognition Arousal/Alertness: Awake/alert                          Exercises      General Comments        Pertinent Vitals/Pain Pain Assessment: No/denies pain    Home Living                      Prior Function            PT Goals (current goals can now be found in the care plan section) Progress towards PT goals: Progressing toward goals    Frequency       PT Plan Current plan remains appropriate    Co-evaluation             End of Session Equipment Utilized During Treatment: Oxygen Activity Tolerance: Patient tolerated treatment  well Patient left: in bed;with call bell/phone within reach     Time: 1355-1418 PT Time Calculation (min) (ACUTE ONLY): 23 min  Charges:  $Gait Training: 23-37 mins                    G Codes:      Claretha Cooper 01/12/2015, 2:26 PM Tresa Endo PT [phone]

## 2015-01-12 NOTE — Care Management Note (Signed)
Case Management Note  Patient Details  Name: SUEKO DIMICHELE MRN: [id number] Date of Birth: [birth year]  Subjective/Objective:                 CM following for progression and d/c planning.  Action/Plan: Met with pt on 5/10 and pt with family on 01/10/15. Pt will d/c to home with family to assist as needed. Granddaughter lives very close and is very attentive. Oxygen ordered.   Expected Discharge Date:           01/12/2015       Expected Discharge Plan:  [place]  In-House Referral:     Discharge planning Services     Post Acute Care Choice:  Durable Medical Equipment Choice offered to:  Patient  DME Arranged:  Oxygen DME Agency:    [facility] HH Arranged:  RN, PT, Disease Management [place] Agency:  [facility]  Status of Service:  Completed 01/12/15  Medicare Important Message Given:  Yes Date Medicare IM Given:  01/09/15 Medicare IM give by:  Jasmine Pang RN MPH, case manager, [phone] Date Additional Medicare IM Given:    Additional Medicare Important Message give by:     If discussed at [place] of Stay Meetings, dates discussed:    Additional Comments:  Adron Bene, RN 01/12/2015, 10:19 AM

## 2015-01-12 NOTE — Progress Notes (Signed)
Patient to be discharged today. Patient is a new diabetic. Diabetic coordinator consult and dietician consult placed per Dr. Hartford Poli.   Joellen Jersey, RN.

## 2015-01-12 NOTE — Progress Notes (Signed)
SATURATION QUALIFICATIONS: (This note is used to comply with regulatory documentation for home oxygen)  Patient Saturations on Room Air at Rest = 94%  Patient Saturations on Room Air while Ambulating = 86%  Patient Saturations on 2 Liters of oxygen while Ambulating = 96%  Please briefly explain why patient needs home oxygen: Desats while ambulating on RA.

## 2015-01-12 NOTE — Plan of Care (Signed)
Problem: Food- and Nutrition-Related Knowledge Deficit (NB-1.1) Goal: Nutrition education Formal process to instruct or train a patient/client in a skill or to impart knowledge to help patients/clients voluntarily manage or modify food choices and eating behavior to maintain or improve health.  Outcome: Completed/Met Date Met:  01/12/15  RD consulted for nutrition education regarding diabetes.     Lab Results  Component Value Date    HGBA1C 6.6* 01/07/2015    RD provided "Carbohydrate Counting for People with Diabetes" handout from the Academy of Nutrition and Dietetics. Discussed different food groups and their effects on blood sugar, emphasizing carbohydrate-containing foods. Provided list of carbohydrates and recommended serving sizes of common foods.  Discussed importance of controlled and consistent carbohydrate intake throughout the day. Recommended 4-5 serving of carbohydrates at meals. Emphasized adequate protein intake. Provided examples of ways to balance meals/snacks and encouraged intake of high-fiber, whole grain complex carbohydrates. Discussed diabetic-friendly drinks options. Teach back method used.  Expect good compliance.  Body mass index is 41.72 kg/(m^2). Pt meets criteria for morbid obesity based on current BMI.  Current diet order is heart healthy/carbohydrate modified, patient is consuming approximately 90-100% of meals at this time. Labs and medications reviewed. No further nutrition interventions warranted at this time. RD contact information provided. If additional nutrition issues arise, please re-consult RD.  Kallie Locks, MS, RD, LDN Pager # [phone] After hours/ weekend pager # [phone]

## 2015-01-13 ENCOUNTER — Emergency Department (HOSPITAL_COMMUNITY)
Admission: EM | Admit: 2015-01-13 | Discharge: 2015-01-13 | Disposition: A | Discharge status: Home / Self Care | Payer: Medicare Other | Attending: Emergency Medicine | Admitting: Emergency Medicine

## 2015-01-13 ENCOUNTER — Encounter (HOSPITAL_COMMUNITY): Payer: Self-pay | Admitting: Emergency Medicine

## 2015-01-13 DIAGNOSIS — Z7952 Long term (current) use of systemic steroids: Secondary | ICD-10-CM | POA: Insufficient documentation

## 2015-01-13 DIAGNOSIS — I10 Essential (primary) hypertension: Secondary | ICD-10-CM | POA: Diagnosis not present

## 2015-01-13 DIAGNOSIS — I509 Heart failure, unspecified: Secondary | ICD-10-CM | POA: Diagnosis not present

## 2015-01-13 DIAGNOSIS — Z792 Long term (current) use of antibiotics: Secondary | ICD-10-CM | POA: Diagnosis not present

## 2015-01-13 DIAGNOSIS — E079 Disorder of thyroid, unspecified: Secondary | ICD-10-CM | POA: Insufficient documentation

## 2015-01-13 DIAGNOSIS — Z79899 Other long term (current) drug therapy: Secondary | ICD-10-CM | POA: Diagnosis not present

## 2015-01-13 DIAGNOSIS — J45909 Unspecified asthma, uncomplicated: Secondary | ICD-10-CM | POA: Diagnosis not present

## 2015-01-13 DIAGNOSIS — Z8719 Personal history of other diseases of the digestive system: Secondary | ICD-10-CM | POA: Diagnosis not present

## 2015-01-13 DIAGNOSIS — I4891 Unspecified atrial fibrillation: Secondary | ICD-10-CM | POA: Insufficient documentation

## 2015-01-13 DIAGNOSIS — R319 Hematuria, unspecified: Secondary | ICD-10-CM | POA: Insufficient documentation

## 2015-01-13 DIAGNOSIS — Z7902 Long term (current) use of antithrombotics/antiplatelets: Secondary | ICD-10-CM | POA: Insufficient documentation

## 2015-01-13 LAB — CBC
HEMATOCRIT: 37.5 % (ref 36.0–46.0)
HEMOGLOBIN: 12.3 g/dL (ref 12.0–15.0)
MCH: 31.7 pg (ref 26.0–34.0)
MCHC: 32.8 g/dL (ref 30.0–36.0)
MCV: 96.6 fL (ref 78.0–100.0)
PLATELETS: 292 10*3/uL (ref 150–400)
RBC: 3.88 MIL/uL (ref 3.87–5.11)
RDW: 13.1 % (ref 11.5–15.5)
WBC: 14.7 10*3/uL — AB (ref 4.0–10.5)

## 2015-01-13 LAB — BASIC METABOLIC PANEL
ANION GAP: 9 (ref 5–15)
BUN: 36 mg/dL — ABNORMAL HIGH (ref 6–20)
CO2: 31 mmol/L (ref 22–32)
CREATININE: 0.93 mg/dL (ref 0.44–1.00)
Calcium: 9.1 mg/dL (ref 8.9–10.3)
Chloride: 96 mmol/L — ABNORMAL LOW (ref 101–111)
GFR calc Af Amer: >60 mL/min (ref >60)
GFR calc non Af Amer: 56 mL/min — ABNORMAL LOW (ref >60)
Glucose, Bld: 155 mg/dL — ABNORMAL HIGH (ref 65–99)
Potassium: 4 mmol/L (ref 3.5–5.1)
SODIUM: 136 mmol/L (ref 135–145)

## 2015-01-13 LAB — URINALYSIS, ROUTINE W REFLEX MICROSCOPIC
Bilirubin Urine: NEGATIVE
Glucose, UA: NEGATIVE mg/dL
KETONES UR: NEGATIVE mg/dL
NITRITE: NEGATIVE
Protein, ur: 100 mg/dL — AB
Specific Gravity, Urine: 1.022 (ref 1.005–1.030)
Urobilinogen, UA: 0.2 mg/dL (ref 0.0–1.0)
pH: 5.5 (ref 5.0–8.0)

## 2015-01-13 LAB — URINE MICROSCOPIC-ADD ON

## 2015-01-13 MED ORDER — CEPHALEXIN 250 MG PO CAPS
500.0000 mg | ORAL_CAPSULE | Freq: Once | ORAL | Status: DC
Start: 2015-01-13 — End: 2015-01-13
  Filled 2015-01-13: qty 2

## 2015-01-13 NOTE — ED Provider Notes (Signed)
CSN: [id number]     Arrival date & time 01/13/15  0530 History   First MD Initiated Contact with Patient 01/13/15 0557     Chief Complaint  Patient presents with  . Hematuria     (Consider location/radiation/quality/duration/timing/severity/associated sxs/prior Treatment) Patient is a 81 y.o. female presenting with hematuria. The history is provided by the patient.  Hematuria This is a new problem. The current episode started 6 to 12 hours ago. The problem occurs constantly. The problem has not changed since onset.Pertinent negatives include no chest pain, no abdominal pain, no headaches and no shortness of breath. Nothing aggravates the symptoms. Nothing relieves the symptoms. She has tried nothing for the symptoms. The treatment provided no relief.    Past Medical History  Diagnosis Date  . Allergy   . Asthma   . Hyperlipidemia   . Hypertension   . Thyroid disease   . GERD (gastroesophageal reflux disease)   . CHF (congestive heart failure)   . Bronchitis   . Atrial fibrillation    Past Surgical History  Procedure Laterality Date  . Appendectomy    . Cholecystectomy    . Abdominal hysterectomy    . Total knee arthroplasty     Family History  Problem Relation Age of Onset  . Cancer Mother     colon  . Heart disease Mother    History  Substance Use Topics  . Smoking status: Never Smoker   . Smokeless tobacco: Never Used  . Alcohol Use: No   OB History    No data available     Review of Systems  Constitutional: Negative for fever and fatigue.  HENT: Negative for congestion and drooling.   Eyes: Negative for pain.  Respiratory: Negative for cough and shortness of breath.   Cardiovascular: Negative for chest pain.  Gastrointestinal: Negative for nausea, vomiting, abdominal pain and diarrhea.  Genitourinary: Positive for hematuria. Negative for dysuria.  Musculoskeletal: Negative for back pain, gait problem and neck pain.  Skin: Negative for color change.   Neurological: Negative for dizziness and headaches.  Hematological: Negative for adenopathy.  Psychiatric/Behavioral: Negative for behavioral problems.  All other systems reviewed and are negative.     Allergies  Codeine  Home Medications   Prior to Admission medications   Medication Sig Start Date End Date Taking? Authorizing Provider  albuterol (PROVENTIL HFA;VENTOLIN HFA) 108 (90 BASE) MCG/ACT inhaler Inhale 2 puffs into the lungs every 4 (four) hours as needed for wheezing or shortness of breath. 12/04/13   Harden Mo, MD  albuterol (PROVENTIL) (2.5 MG/3ML) 0.083% nebulizer solution Take 3 mLs (2.5 mg total) by nebulization every 4 (four) hours as needed for wheezing. Patient not taking: Reported on 01/06/2015 12/04/13   Harden Mo, MD  apixaban (ELIQUIS) 5 MG TABS tablet Take 1 tablet (5 mg total) by mouth 2 (two) times daily. 01/12/15   Verlee Monte, MD  benazepril (LOTENSIN) 20 MG tablet Take 1 tablet (20 mg total) by mouth daily. 08/19/12   Marletta Lor, MD  furosemide (LASIX) 40 MG tablet Take 1 tablet (40 mg total) by mouth daily. May take an additional evening dose if legs are swelling Patient not taking: Reported on 01/06/2015 08/19/12   Marletta Lor, MD  guaiFENesin (MUCINEX) 600 MG 12 hr tablet Take 2 tablets (1,200 mg total) by mouth 2 (two) times daily as needed for cough. 01/12/15   Verlee Monte, MD  HYDROcodone-acetaminophen (NORCO/VICODIN) 5-325 MG per tablet Take 1-2 tablets by mouth every  6 hours as needed for pain. Patient not taking: Reported on 01/06/2015 06/28/14   Elmyra Ricks Pisciotta, PA-C  levalbuterol [facility] HFA) 45 MCG/ACT inhaler Inhale 1-2 puffs into the lungs every 4 (four) hours as needed for wheezing. Patient not taking: Reported on 01/06/2015 07/03/12   Shanon Rosser, MD  levofloxacin (LEVAQUIN) 750 MG tablet Take 1 tablet (750 mg total) by mouth daily. 01/12/15   Verlee Monte, MD  levothyroxine (SYNTHROID, LEVOTHROID) 100 MCG tablet Take 1 tablet  (100 mcg total) by mouth daily. 08/19/12   Marletta Lor, MD  metFORMIN (GLUCOPHAGE) 500 MG tablet Take 1 tablet (500 mg total) by mouth 2 (two) times daily with a meal. 01/12/15   Verlee Monte, MD  metoprolol succinate (TOPROL-XL) 25 MG 24 hr tablet Take 25 mg by mouth daily. 01/01/15   Historical Provider, MD  predniSONE (STERAPRED UNI-PAK 21 TAB) 10 MG (21) TBPK tablet Take 6-5-4-3-2-1 PO daily till gone 01/12/15   Mutaz Elmahi, MD   BP 147/50 mmHg  Pulse 48  Temp(Src) 97.9 F (36.6 C) (Oral)  Resp 16  Ht 5\' 1"  (1.549 m)  Wt 220 lb 7 oz (99.99 kg)  BMI 41.67 kg/m2  SpO2 97% Physical Exam  Constitutional: She is oriented to person, place, and time. She appears well-developed and well-nourished.  HENT:  Head: Normocephalic.  Mouth/Throat: Oropharynx is clear and moist. No oropharyngeal exudate.  Eyes: Conjunctivae and EOM are normal. Pupils are equal, round, and reactive to light.  Neck: Normal range of motion. Neck supple.  Cardiovascular: Normal rate, regular rhythm, normal heart sounds and intact distal pulses.  Exam reveals no gallop and no friction rub.   No murmur heard. Pulmonary/Chest: Effort normal and breath sounds normal. No respiratory distress. She has no wheezes.  Abdominal: Soft. Bowel sounds are normal. There is no tenderness. There is no rebound and no guarding.  Musculoskeletal: Normal range of motion. She exhibits no edema or tenderness.  Neurological: She is alert and oriented to person, place, and time.  Skin: Skin is warm and dry.  Psychiatric: She has a normal mood and affect. Her behavior is normal.  Nursing note and vitals reviewed.   ED Course  Procedures (including critical care time) Labs Review Labs Reviewed  CBC - Abnormal; Notable for the following:    WBC 14.7 (*)    All other components within normal limits  BASIC METABOLIC PANEL - Abnormal; Notable for the following:    Chloride 96 (*)    Glucose, Bld 155 (*)    BUN 36 (*)    GFR calc  non Af Amer 56 (*)    All other components within normal limits  URINALYSIS, ROUTINE W REFLEX MICROSCOPIC - Abnormal; Notable for the following:    Color, Urine RED (*)    APPearance CLOUDY (*)    Hgb urine dipstick LARGE (*)    Protein, ur 100 (*)    Leukocytes, UA TRACE (*)    All other components within normal limits  URINE MICROSCOPIC-ADD ON - Abnormal; Notable for the following:    Squamous Epithelial / LPF FEW (*)    Bacteria, UA FEW (*)    All other components within normal limits  URINE CULTURE    Imaging Review No results found.   EKG Interpretation None      MDM   Final diagnoses:  Hematuria    7:18 AM 81 y.o. female w afib on eliquis, hypoxia on 2L [place] who presents with hematuria. The patient was recently admitted  for what was thought to be mild CHF. She had a normal LVEF and hypoxia was thought to be due to tracheobronchitis. She was started on eliquis for A. fib while in the hospital. She was discharged yesterday evening and notes that she had some bloody urine prior to discharge. She states that she did not tell anyone about this. She has continued to have some blood in her urine throughout the night and morning. She states that she is otherwise been well and denies any worsening of her shortness of breath. She denies any dysuria or other GU symptoms. She denies any pain. She is afebrile and vital signs are unremarkable here. I suspect her hematuria is due to the addition of eliquis to her medication regimen. She is no longer on coumadin.   INR 1.74 yesterday.   9:22 AM: Hgb stable. UA w/ large blood, trace leuks, few bact. Pt d/c on levaquin for bronchitis which she has not started yet, but doubt UTI.  I have discussed the diagnosis/risks/treatment options with the patient and believe the pt to be eligible for discharge home to follow-up with her pcp on Monday for further eval of hematuria. We also discussed returning to the ED immediately if new or worsening sx occur.  We discussed the sx which are most concerning (e.g., worsening hematuria, dizziness, sob, cp) that necessitate immediate return. Medications administered to the patient during their visit and any new prescriptions provided to the patient are listed below.  Medications given during this visit Medications - No data to display  New Prescriptions   No medications on file     Pamella Pert, MD 01/13/15 [phone]

## 2015-01-13 NOTE — Discharge Instructions (Signed)

## 2015-01-13 NOTE — ED Notes (Signed)
Pt states she was just discharged from our facility, states she saw blood in toilet p/t her discharge yesterday at 1800. States it was more significant tonight. States no blood on underwear, only seen when using toilet.

## 2015-01-15 LAB — URINE CULTURE: Colony Count: 70000

## 2015-01-16 ENCOUNTER — Telehealth (HOSPITAL_COMMUNITY): Payer: Self-pay

## 2015-01-16 NOTE — ED Notes (Signed)
Post ED Visit - Positive Culture Follow-up  Culture report reviewed by antimicrobial stewardship pharmacist: []  Wes Guernsey, Pharm.D., BCPS [x]  Heide Guile, Pharm.D., BCPS []  Alycia Rossetti, Pharm.D., BCPS []  [place], [place].D., BCPS, AAHIVP []  Legrand Como, Pharm.D., BCPS, AAHIVP []  Isac Sarna, Pharm.D., BCPS  Positive urine culture Per Alyse Low  no further patient follow-up is required at this time.  Ileene Musa 01/16/2015, 11:41 AM

## 2015-01-20 ENCOUNTER — Emergency Department (HOSPITAL_COMMUNITY): Payer: Medicare Other

## 2015-01-20 ENCOUNTER — Other Ambulatory Visit (HOSPITAL_COMMUNITY): Payer: Self-pay

## 2015-01-20 ENCOUNTER — Encounter (HOSPITAL_COMMUNITY): Payer: Self-pay | Admitting: Emergency Medicine

## 2015-01-20 ENCOUNTER — Emergency Department (HOSPITAL_COMMUNITY)
Admission: EM | Admit: 2015-01-20 | Discharge: 2015-01-20 | Disposition: A | Discharge status: Home / Self Care | Payer: Medicare Other | Attending: Emergency Medicine | Admitting: Emergency Medicine

## 2015-01-20 DIAGNOSIS — I509 Heart failure, unspecified: Secondary | ICD-10-CM | POA: Insufficient documentation

## 2015-01-20 DIAGNOSIS — Z9049 Acquired absence of other specified parts of digestive tract: Secondary | ICD-10-CM | POA: Diagnosis not present

## 2015-01-20 DIAGNOSIS — Z8719 Personal history of other diseases of the digestive system: Secondary | ICD-10-CM | POA: Diagnosis not present

## 2015-01-20 DIAGNOSIS — E119 Type 2 diabetes mellitus without complications: Secondary | ICD-10-CM | POA: Diagnosis not present

## 2015-01-20 DIAGNOSIS — I4891 Unspecified atrial fibrillation: Secondary | ICD-10-CM | POA: Insufficient documentation

## 2015-01-20 DIAGNOSIS — N390 Urinary tract infection, site not specified: Secondary | ICD-10-CM | POA: Insufficient documentation

## 2015-01-20 DIAGNOSIS — Z7951 Long term (current) use of inhaled steroids: Secondary | ICD-10-CM | POA: Diagnosis not present

## 2015-01-20 DIAGNOSIS — R600 Localized edema: Secondary | ICD-10-CM | POA: Diagnosis not present

## 2015-01-20 DIAGNOSIS — J45909 Unspecified asthma, uncomplicated: Secondary | ICD-10-CM | POA: Insufficient documentation

## 2015-01-20 DIAGNOSIS — Z90710 Acquired absence of both cervix and uterus: Secondary | ICD-10-CM | POA: Diagnosis not present

## 2015-01-20 DIAGNOSIS — E079 Disorder of thyroid, unspecified: Secondary | ICD-10-CM | POA: Diagnosis not present

## 2015-01-20 DIAGNOSIS — R319 Hematuria, unspecified: Secondary | ICD-10-CM

## 2015-01-20 DIAGNOSIS — K439 Ventral hernia without obstruction or gangrene: Secondary | ICD-10-CM

## 2015-01-20 DIAGNOSIS — R109 Unspecified abdominal pain: Secondary | ICD-10-CM

## 2015-01-20 DIAGNOSIS — Z9089 Acquired absence of other organs: Secondary | ICD-10-CM | POA: Diagnosis not present

## 2015-01-20 DIAGNOSIS — Z79899 Other long term (current) drug therapy: Secondary | ICD-10-CM | POA: Insufficient documentation

## 2015-01-20 HISTORY — DX: Personal history of other diseases of the digestive system: Z87.19

## 2015-01-20 HISTORY — DX: Ventral hernia without obstruction or gangrene: K43.9

## 2015-01-20 HISTORY — DX: Type 2 diabetes mellitus without complications: E11.9

## 2015-01-20 LAB — CBC WITH DIFFERENTIAL/PLATELET
Basophils Absolute: 0 10*3/uL (ref 0.0–0.1)
Basophils Relative: 0 % (ref 0–1)
EOS ABS: 0.3 10*3/uL (ref 0.0–0.7)
EOS PCT: 2 % (ref 0–5)
HCT: 40.6 % (ref 36.0–46.0)
Hemoglobin: 13.2 g/dL (ref 12.0–15.0)
LYMPHS ABS: 1.9 10*3/uL (ref 0.7–4.0)
Lymphocytes Relative: 18 % (ref 12–46)
MCH: 31.4 pg (ref 26.0–34.0)
MCHC: 32.5 g/dL (ref 30.0–36.0)
MCV: 96.7 fL (ref 78.0–100.0)
Monocytes Absolute: 0.9 10*3/uL (ref 0.1–1.0)
Monocytes Relative: 8 % (ref 3–12)
NEUTROS PCT: 72 % (ref 43–77)
Neutro Abs: 7.9 10*3/uL — ABNORMAL HIGH (ref 1.7–7.7)
PLATELETS: 221 10*3/uL (ref 150–400)
RBC: 4.2 MIL/uL (ref 3.87–5.11)
RDW: 13.3 % (ref 11.5–15.5)
WBC: 10.9 10*3/uL — AB (ref 4.0–10.5)

## 2015-01-20 LAB — COMPREHENSIVE METABOLIC PANEL
ALT: 35 U/L (ref 14–54)
AST: 24 U/L (ref 15–41)
Albumin: 3.4 g/dL — ABNORMAL LOW (ref 3.5–5.0)
Alkaline Phosphatase: 34 U/L — ABNORMAL LOW (ref 38–126)
Anion gap: 10 (ref 5–15)
BUN: 31 mg/dL — AB (ref 6–20)
CALCIUM: 9.4 mg/dL (ref 8.9–10.3)
CO2: 33 mmol/L — ABNORMAL HIGH (ref 22–32)
CREATININE: 1.02 mg/dL — AB (ref 0.44–1.00)
Chloride: 94 mmol/L — ABNORMAL LOW (ref 101–111)
GFR calc Af Amer: 58 mL/min — ABNORMAL LOW (ref >60)
GFR, EST NON AFRICAN AMERICAN: 50 mL/min — AB (ref >60)
Glucose, Bld: 104 mg/dL — ABNORMAL HIGH (ref 65–99)
POTASSIUM: 3.9 mmol/L (ref 3.5–5.1)
Sodium: 137 mmol/L (ref 135–145)
Total Bilirubin: 0.6 mg/dL (ref 0.3–1.2)
Total Protein: 6 g/dL — ABNORMAL LOW (ref 6.5–8.1)

## 2015-01-20 LAB — BRAIN NATRIURETIC PEPTIDE: B NATRIURETIC PEPTIDE 5: 43.6 pg/mL (ref 0.0–100.0)

## 2015-01-20 LAB — URINALYSIS, ROUTINE W REFLEX MICROSCOPIC
BILIRUBIN URINE: NEGATIVE
Glucose, UA: NEGATIVE mg/dL
Ketones, ur: NEGATIVE mg/dL
Nitrite: NEGATIVE
Protein, ur: NEGATIVE mg/dL
Specific Gravity, Urine: 1.008 (ref 1.005–1.030)
UROBILINOGEN UA: 0.2 mg/dL (ref 0.0–1.0)
pH: 6 (ref 5.0–8.0)

## 2015-01-20 LAB — URINE MICROSCOPIC-ADD ON

## 2015-01-20 LAB — I-STAT TROPONIN, ED: Troponin i, poc: 0 ng/mL (ref 0.00–0.08)

## 2015-01-20 LAB — LIPASE, BLOOD: Lipase: 17 U/L — ABNORMAL LOW (ref 22–51)

## 2015-01-20 MED ORDER — CEPHALEXIN 500 MG PO CAPS: 500.0000 mg | ORAL_CAPSULE | Freq: Three times a day (TID) | ORAL | Status: DC

## 2015-01-20 MED ORDER — DEXTROSE 5 % IV SOLN
1.0000 g | Freq: Once | INTRAVENOUS | Status: AC
  Administered 2015-01-20: 1 g via INTRAVENOUS
  Filled 2015-01-20: qty 10

## 2015-01-20 MED ORDER — IOHEXOL 300 MG/ML  SOLN
100.0000 mL | Freq: Once | INTRAMUSCULAR | Status: AC | PRN
  Administered 2015-01-20: 100 mL via INTRAVENOUS

## 2015-01-20 MED ORDER — IOHEXOL 300 MG/ML  SOLN
25.0000 mL | Freq: Once | INTRAMUSCULAR | Status: AC | PRN
  Administered 2015-01-20: 25 mL via ORAL

## 2015-01-20 NOTE — ED Notes (Signed)
Patient transported to CT 

## 2015-01-20 NOTE — ED Notes (Signed)
358ml per bladder scanner.

## 2015-01-20 NOTE — ED Notes (Signed)
Patient transported to X-ray 

## 2015-01-20 NOTE — ED Notes (Signed)
63ml per bladder scanner.

## 2015-01-20 NOTE — ED Provider Notes (Signed)
CSN: [id number]     Arrival date & time 01/20/15  1119 History   First MD Initiated Contact with Patient 01/20/15 1121     Chief Complaint  Patient presents with  . Urinary Retention  . Abdominal Pain     (Consider location/radiation/quality/duration/timing/severity/associated sxs/prior Treatment) The history is provided by the patient.  [name] Case is a 81 y.o. female hx of asthma, HL, HTN, GERD, A. fib recently on Eliquis but taken off here presenting with abdominal pain, trouble urinating. Patient was recently admitted for CHF exacerbation. Still has persistent shortness of breath but improved since last mission. Patient is on 2 L nasal cannula at home. Patient came a week ago for hematuria and was diagnosed with bronchitis was started on Levaquin. Patient states that hematuria resolved but since yesterday she has some trouble urinating. Also has some dysuria as well. Moreover she has some right-sided abdominal pain. She had an episode of vomiting.    Past Medical History  Diagnosis Date  . Allergy   . Asthma   . Hyperlipidemia   . Hypertension   . Thyroid disease   . GERD (gastroesophageal reflux disease)   . CHF (congestive heart failure)   . Bronchitis   . Atrial fibrillation   . Diabetes mellitus without complication    Past Surgical History  Procedure Laterality Date  . Appendectomy    . Cholecystectomy    . Abdominal hysterectomy    . Total knee arthroplasty     Family History  Problem Relation Age of Onset  . Cancer Mother     colon  . Heart disease Mother    History  Substance Use Topics  . Smoking status: Never Smoker   . Smokeless tobacco: Never Used  . Alcohol Use: No   OB History    No data available     Review of Systems  Gastrointestinal: Positive for abdominal pain.  Genitourinary: Positive for dysuria.  All other systems reviewed and are negative.     Allergies  Codeine  Home Medications   Prior to Admission medications    Medication Sig Start Date End Date Taking? Authorizing Provider  benazepril (LOTENSIN) 20 MG tablet Take 1 tablet (20 mg total) by mouth daily. 08/19/12  Yes Marletta Lor, MD  budesonide-formoterol [facility]) 160-4.5 MCG/ACT inhaler Inhale 2 puffs into the lungs 2 (two) times daily.   Yes Historical Provider, MD  furosemide (LASIX) 40 MG tablet Take 1 tablet (40 mg total) by mouth daily. May take an additional evening dose if legs are swelling 08/19/12  Yes Marletta Lor, MD  levothyroxine (SYNTHROID, LEVOTHROID) 100 MCG tablet Take 1 tablet (100 mcg total) by mouth daily. 08/19/12  Yes Marletta Lor, MD  metFORMIN (GLUCOPHAGE) 500 MG tablet Take 1 tablet (500 mg total) by mouth 2 (two) times daily with a meal. 01/12/15  Yes Verlee Monte, MD  metoprolol succinate (TOPROL-XL) 25 MG 24 hr tablet Take 25 mg by mouth daily. 01/01/15  Yes Historical Provider, MD  albuterol (PROVENTIL HFA;VENTOLIN HFA) 108 (90 BASE) MCG/ACT inhaler Inhale 2 puffs into the lungs every 4 (four) hours as needed for wheezing or shortness of breath. Patient not taking: Reported on 01/20/2015 12/04/13   Harden Mo, MD  albuterol (PROVENTIL) (2.5 MG/3ML) 0.083% nebulizer solution Take 3 mLs (2.5 mg total) by nebulization every 4 (four) hours as needed for wheezing. Patient not taking: Reported on 01/06/2015 12/04/13   Harden Mo, MD  apixaban (ELIQUIS) 5 MG TABS tablet  Take 1 tablet (5 mg total) by mouth 2 (two) times daily. Patient not taking: Reported on 01/20/2015 01/12/15   Verlee Monte, MD  guaiFENesin (MUCINEX) 600 MG 12 hr tablet Take 2 tablets (1,200 mg total) by mouth 2 (two) times daily as needed for cough. Patient not taking: Reported on 01/20/2015 01/12/15   Verlee Monte, MD  HYDROcodone-acetaminophen (NORCO/VICODIN) 5-325 MG per tablet Take 1-2 tablets by mouth every 6 hours as needed for pain. Patient not taking: Reported on 01/06/2015 06/28/14   Elmyra Ricks Pisciotta, PA-C  levalbuterol [facility] HFA) 45  MCG/ACT inhaler Inhale 1-2 puffs into the lungs every 4 (four) hours as needed for wheezing. Patient not taking: Reported on 01/06/2015 07/03/12   Shanon Rosser, MD  levofloxacin (LEVAQUIN) 750 MG tablet Take 1 tablet (750 mg total) by mouth daily. Patient not taking: Reported on 01/20/2015 01/12/15   Verlee Monte, MD  predniSONE (STERAPRED UNI-PAK 21 TAB) 10 MG (21) TBPK tablet Take 6-5-4-3-2-1 PO daily till gone Patient not taking: Reported on 01/20/2015 01/12/15   Verlee Monte, MD   BP 118/46 mmHg  Pulse 55  Temp(Src) 98.3 F (36.8 C) (Oral)  Resp 14  Wt 210 lb (95.255 kg)  SpO2 99% Physical Exam  Constitutional: She is oriented to person, place, and time.  Chronically ill   HENT:  Head: Normocephalic.  Mouth/Throat: Oropharynx is clear and moist.  Eyes: Conjunctivae are normal. Pupils are equal, round, and reactive to light.  Neck: Normal range of motion. Neck supple.  Cardiovascular: Normal rate, regular rhythm and normal heart sounds.   Pulmonary/Chest:  Diminished throughout. No wheezing   Abdominal: Soft. Bowel sounds are normal.  Mild R CVAT, mild RLQ tenderness. + diffuse bruising (likely from lovenox injections in the hospital)   Musculoskeletal: Normal range of motion.  1+ edema bilaterally   Neurological: She is oriented to person, place, and time.  Skin: Skin is warm and dry.  Psychiatric: She has a normal mood and affect. Her behavior is normal. Judgment and thought content normal.  Nursing note and vitals reviewed.   ED Course  Procedures (including critical care time) Labs Review Labs Reviewed  CBC WITH DIFFERENTIAL/PLATELET - Abnormal; Notable for the following:    WBC 10.9 (*)    Neutro Abs 7.9 (*)    All other components within normal limits  COMPREHENSIVE METABOLIC PANEL - Abnormal; Notable for the following:    Chloride 94 (*)    CO2 33 (*)    Glucose, Bld 104 (*)    BUN 31 (*)    Creatinine, Ser 1.02 (*)    Total Protein 6.0 (*)    Albumin 3.4 (*)     Alkaline Phosphatase 34 (*)    GFR calc non Af Amer 50 (*)    GFR calc Af Amer 58 (*)    All other components within normal limits  LIPASE, BLOOD - Abnormal; Notable for the following:    Lipase 17 (*)    All other components within normal limits  URINALYSIS, ROUTINE W REFLEX MICROSCOPIC - Abnormal; Notable for the following:    Hgb urine dipstick LARGE (*)    Leukocytes, UA MODERATE (*)    All other components within normal limits  URINE CULTURE  BRAIN NATRIURETIC PEPTIDE  URINE MICROSCOPIC-ADD ON  Randolm Idol, ED    Imaging Review Dg Chest 2 View  01/20/2015   CLINICAL DATA:  Productive cough, vomiting and shortness of breath.  EXAM: CHEST - 2 VIEW  COMPARISON:  01/10/2015  FINDINGS:  The heart size and mediastinal contours are within normal limits. There is no evidence of pulmonary edema, consolidation, pneumothorax, nodule or pleural fluid. The visualized skeletal structures are unremarkable.  IMPRESSION: No active disease.   Electronically Signed   By: Aletta Edouard M.D.   On: 01/20/2015 12:36   Ct Abdomen Pelvis W Contrast  01/20/2015   CLINICAL DATA:  Right abdominal pain radiating into the back since 9 a.m. today. Nausea and vomiting. Previous appendectomy, cholecystectomy and hysterectomy.  EXAM: CT ABDOMEN AND PELVIS WITH CONTRAST  TECHNIQUE: Multidetector CT imaging of the abdomen and pelvis was performed using the standard protocol following bolus administration of intravenous contrast.  CONTRAST:  161mL OMNIPAQUE IOHEXOL 300 MG/ML  SOLN  COMPARISON:  Pelvis CT dated 06/18/2004.  FINDINGS: Diffuse pancreatic atrophy with extensive fatty replacement. Cholecystectomy clips. Mild diffuse low density of the liver relative to the spleen. Small supraumbilical hernia containing fat. 4.4 cm simple appearing cyst arising from the lower pole of the right kidney.  Multiple sigmoid colon diverticula without evidence of diverticulitis. Surgically absent appendix. Small sliding hiatal  hernia. No small bowel abnormalities and no enlarged lymph nodes.  Normal appearing spleen, adrenal glands, left kidney and urinary bladder. Surgically absent uterus. Small ovaries or ovarian remnants. Mild linear atelectasis or scarring at both lung bases.  Lumbar and lower thoracic spine degenerative changes. Minimal left hip degenerative changes. Bilateral L5 pars interarticularis defects with associated mild anterolisthesis at the L5-S1 level. Facet degenerative changes with associated mild retrolisthesis at the L4-5 level.  IMPRESSION: 1. No acute abnormality. 2. Diffuse pancreatic atrophy with extensive fatty replacement. 3. Mild diffuse hepatic steatosis. 4. Small supraumbilical hernia containing fat. 5. Sigmoid colon diverticulosis. 6. Small sliding hiatal hernia. 7. Bilateral L5 spondylolysis with associated grade 1 spondylolisthesis at the L5-S1 level.   Electronically Signed   By: Claudie Revering M.D.   On: 01/20/2015 13:52     EKG Interpretation None      MDM   Final diagnoses:  Abdominal pain    [name] Case is a 81 y.o. female here with ab pain, trouble urinating. Bladder scan was 300 before she urinated and then after urination, was 19 cc, so I don't think she is in retention. Consider UTI vs intra abdominal pathology. Will get labs, UA, CT ab/pel. Still has cough and mild SOB so will get CXR and BNP.   4:01 PM UA ? UTI, + hematuria. CT unremarkable. She has persistent hematuria. I wonder if she has UTI vs bladder malignancy. Just finished levaquin. Will try keflex and have her f/u with urology.    Wandra Arthurs, MD 01/20/15 [phone]

## 2015-01-20 NOTE — Discharge Instructions (Signed)
Hold metformin for 2 days.   Take keflex for UTI.   You have blood in your urine. You need to see urologist and your doctor.   Return to ER if you are unable to urinate, fever, severe abdominal pain.   Metformin and X-ray Contrast Studies For some X-ray exams, a contrast dye is used. Contrast dye is a type of medicine used to make the X-ray image clearer. The contrast dye is given to the patient through a vein (intravenously). If you need to have this type of X-ray exam and you take a medication called metformin, your caregiver may have you stop taking metformin before the exam.  LACTIC ACIDOSIS In rare cases, a serious medical condition called lactic acidosis can develop in people who take metformin and receive contrast dye. The following conditions can increase the risk of this complication:   Kidney failure.  Liver problems.  Certain types of heart problems such as:  Heart failure.  Heart attack.  Heart infection.  Heart valve problems.  Alcohol abuse. If left untreated, lactic acidosis can lead to coma.  SYMPTOMS OF LACTIC ACIDOSIS Symptoms of lactic acidosis can include:  Rapid breathing (hyperventilation).  Neurologic symptoms such as:  Headaches.  Confusion.  Dizziness.  Excessive sweating.  Feeling sick to your stomach (nauseous) or throwing up (vomiting). AFTER THE X-RAY EXAM  Stay well-hydrated. Drink fluids as instructed by your caregiver.  If you have a risk of developing lactic acidosis, blood tests may be done to make sure your kidney function is okay.  Metformin is usually stopped for 48 hours after the X-ray exam. Ask your caregiver when you can start taking metformin again. SEEK MEDICAL CARE IF:   You have shortness of breath or difficulty breathing.  You develop a headache that does not go away.  You have nausea or vomiting.  You urinate more than normal.  You develop a skin rash and have:  Redness.  Swelling.  Itching. Document  Released: 08/06/2009 Document Revised: 11/10/2011 Document Reviewed: 08/06/2009 [facility] Patient Information 2015 [place], [place]. This information is not intended to replace advice given to you by your health care provider. Make sure you discuss any questions you have with your health care provider.

## 2015-01-20 NOTE — ED Notes (Signed)
PER EMS: pt from home for eval of urinary retention that started on 01/19/15, pt states she has not been able to have a normal urine since yesterday afternoon and now reports RLQ pain. Pt also reports one episode of emesis today, denies any hematuria or hemoptysis. nad noted. Pt on 2L chronic home o2

## 2015-01-20 NOTE — ED Notes (Signed)
Pt back from CT

## 2015-01-22 LAB — URINE CULTURE: Colony Count: 85000

## 2015-01-24 ENCOUNTER — Telehealth: Payer: Self-pay | Admitting: *Deleted

## 2015-01-24 NOTE — ED Notes (Signed)
(+)  urine culture, treated with Cephalexin, OK per Andres Shad, Pharm

## 2015-02-16 ENCOUNTER — Encounter: Payer: Self-pay | Admitting: Gastroenterology

## 2015-02-20 DIAGNOSIS — E876 Hypokalemia: Secondary | ICD-10-CM | POA: Insufficient documentation

## 2015-02-20 DIAGNOSIS — J9611 Chronic respiratory failure with hypoxia: Secondary | ICD-10-CM | POA: Insufficient documentation

## 2015-03-06 DIAGNOSIS — R252 Cramp and spasm: Secondary | ICD-10-CM | POA: Diagnosis not present

## 2015-03-06 DIAGNOSIS — D649 Anemia, unspecified: Secondary | ICD-10-CM | POA: Diagnosis not present

## 2015-03-08 ENCOUNTER — Encounter: Payer: Self-pay | Admitting: Internal Medicine

## 2015-03-08 ENCOUNTER — Ambulatory Visit (INDEPENDENT_AMBULATORY_CARE_PROVIDER_SITE_OTHER): Payer: Medicare Other | Admitting: Internal Medicine

## 2015-03-08 VITALS — BP 122/60 | HR 60 | Temp 97.8°F | Wt 213.0 lb

## 2015-03-08 DIAGNOSIS — E119 Type 2 diabetes mellitus without complications: Secondary | ICD-10-CM

## 2015-03-08 DIAGNOSIS — I482 Chronic atrial fibrillation, unspecified: Secondary | ICD-10-CM

## 2015-03-08 DIAGNOSIS — J9611 Chronic respiratory failure with hypoxia: Secondary | ICD-10-CM

## 2015-03-08 DIAGNOSIS — Z23 Encounter for immunization: Secondary | ICD-10-CM

## 2015-03-08 DIAGNOSIS — E1165 Type 2 diabetes mellitus with hyperglycemia: Secondary | ICD-10-CM

## 2015-03-08 DIAGNOSIS — I10 Essential (primary) hypertension: Secondary | ICD-10-CM

## 2015-03-08 DIAGNOSIS — J45909 Unspecified asthma, uncomplicated: Secondary | ICD-10-CM

## 2015-03-08 MED ORDER — RIVAROXABAN 20 MG PO TABS: 20.0000 mg | ORAL_TABLET | Freq: Every day | ORAL | Status: DC

## 2015-03-08 NOTE — Assessment & Plan Note (Signed)
Unclear if this is the cause of her chronic respiratory failure. Will get PFTs to shed some light on her lung problem.

## 2015-03-08 NOTE — Assessment & Plan Note (Signed)
Complicated by diabetes, hypertension, hyperlipidemia. Unclear if she has some OHS contributing to chronic respiratory failure. She is not able to get around right now due to lack of mobility with the portable oxygen.

## 2015-03-08 NOTE — Progress Notes (Signed)
Pre visit review using our clinic review tool, if applicable. No additional management support is needed unless otherwise documented below in the visit note. 

## 2015-03-08 NOTE — Assessment & Plan Note (Addendum)
With hypoxia. O2 sats down to 83% with movement. Not in exacerbation today and will get PFTs to delineate her problem and possible cause for the hypoxia. Not a former smoker and no know exposures. Possible OHS. Want her to be more mobile to build stamina and given rx for oxygen concentrator at today's visit.

## 2015-03-08 NOTE — Assessment & Plan Note (Addendum)
Last HgA1c 6.6 and at goal. Continue metformin 500 mg BID. Is on ACE-I as well. Getting records to check for signs of other complications. Referral for ophto today for eye exam. Prevnar 13 given at visit.

## 2015-03-08 NOTE — Assessment & Plan Note (Signed)
Currently not on anticoagulation for stroke prevention. Will Rx xarelto since better covered by her insurance. Talked to her about risk of stroke and the need for prevention. On rate control with metoprolol and is at goal.

## 2015-03-08 NOTE — Patient Instructions (Addendum)
We will get the lung function test which will help [place] figure out what is causing the problems with the lungs.   We have given you the prescription for the oxygen concentrator so you can be more mobile again.   We will get the records from Dr. Arelia Sneddon to see what tests have been done recently.   We have sent in a medicine called xarelto which is also a blood thinner to reduce your risk of stroke. It is a once a day medicine that should be better covered by your insurance. Until you can get it take an aspirin 81 mg daily.   Come back in about 3 months for a check up or sooner depending on how the lung test goes.

## 2015-03-08 NOTE — Progress Notes (Signed)
   Subjective:    Patient ID: [name] Case, female    DOB: Jun 10, 1932, 81 y.o.   MRN: [id number]  HPI The patient is an 81 YO female who is coming in for care for her diabetes and breathing. She has been taking metformin for it now for some time. Denies complications such as burning or numbness of her feet. Eye exam not done in some time. She was seen in the hospital for her breathing back in May and workup was done with echo (no signs of heart failure or volume overload). She is still SOB with exertion and using 2L oxygen all the time. Needs a concentrator as she is not able to walk around due to carrying oxygen tanks. She was very active and this is limiting her lifestyle. When she takes off the oxygen goes down to 83% with activity.   PMH, [facility], social history reviewed and updated.   Review of Systems  Constitutional: Positive for activity change. Negative for fever, appetite change, fatigue and unexpected weight change.  HENT: Negative.   Eyes: Negative.   Respiratory: Positive for shortness of breath. Negative for cough, chest tightness and wheezing.   Cardiovascular: Negative for chest pain, palpitations and leg swelling.  Gastrointestinal: Positive for diarrhea. Negative for nausea, abdominal pain, constipation and abdominal distention.       Intermittent, not present currently  Musculoskeletal: Negative.   Skin: Negative.   Neurological: Negative.   Psychiatric/Behavioral: Negative.       Objective:   Physical Exam  Constitutional: She is oriented to person, place, and time. She appears well-developed and well-nourished.  Overweight  HENT:  Head: Normocephalic and atraumatic.  Eyes: EOM are normal.  Neck: Normal range of motion. No JVD present.  Cardiovascular: Normal rate and regular rhythm.   Pulmonary/Chest: Effort normal. No respiratory distress. She has wheezes. She has no rales.  Trace wheezing which clears with forced cough  Abdominal: Soft. She exhibits no  distension. There is no tenderness. There is no rebound.  Lymphadenopathy:    She has no cervical adenopathy.  Neurological: She is alert and oriented to person, place, and time. Coordination normal.  Skin: Skin is warm and dry.  Psychiatric: She has a normal mood and affect.   Filed Vitals:   03/08/15 1056  BP: 122/60  Pulse: 60  Temp: 97.8 F (36.6 C)  TempSrc: Oral  Weight: 213 lb (96.616 kg)  SpO2: 99%      Assessment & Plan:  Prevnar 13 given at today's visit.

## 2015-03-08 NOTE — Assessment & Plan Note (Signed)
BP controlled on benazepril and metoprolol. Reviewed last BMP and will get records from PCP. No changes needed today.

## 2015-03-13 ENCOUNTER — Telehealth: Payer: Self-pay | Admitting: Internal Medicine

## 2015-03-13 NOTE — Telephone Encounter (Signed)
Pt called in and said they her ins compy denied rivaroxaban (XARELTO) 20 MG TABS tablet [[id number]].  What else can she take for a blood thinner?  She is just taking Asprin right now   Best number [phone] Cell [phone]

## 2015-03-14 DIAGNOSIS — I5041 Acute combined systolic (congestive) and diastolic (congestive) heart failure: Secondary | ICD-10-CM | POA: Diagnosis not present

## 2015-03-14 MED ORDER — APIXABAN 5 MG PO TABS: 5.0000 mg | ORAL_TABLET | Freq: Two times a day (BID) | ORAL | Status: DC

## 2015-03-14 NOTE — Telephone Encounter (Signed)
Patient aware.

## 2015-03-14 NOTE — Telephone Encounter (Signed)
PA required for Eliquis as well

## 2015-03-14 NOTE — Telephone Encounter (Signed)
Can try eliquis also. This is a medicine that is twice a day and we can work to get it approved.

## 2015-03-15 NOTE — Telephone Encounter (Signed)
Then we should work on the PA and have her continue taking aspirin in the meantime.

## 2015-03-19 ENCOUNTER — Telehealth: Payer: Self-pay

## 2015-03-19 NOTE — Telephone Encounter (Signed)
PA initiated via covermymeds 7/18 KEY: [id number]

## 2015-03-20 NOTE — Telephone Encounter (Signed)
PA approved. REF # [id number]. Pharmacy notified

## 2015-03-20 NOTE — Telephone Encounter (Signed)
Resubmitted PA for a new form. New KEY: [id number]

## 2015-03-21 ENCOUNTER — Telehealth: Payer: Self-pay | Admitting: Internal Medicine

## 2015-03-21 ENCOUNTER — Other Ambulatory Visit: Payer: Self-pay | Admitting: Geriatric Medicine

## 2015-03-21 MED ORDER — BUDESONIDE-FORMOTEROL FUMARATE 160-4.5 MCG/ACT IN AERO: 2.0000 | INHALATION_SPRAY | Freq: Two times a day (BID) | RESPIRATORY_TRACT | Status: DC

## 2015-03-21 NOTE — Telephone Encounter (Signed)
Sent to pharmacy 

## 2015-03-21 NOTE — Telephone Encounter (Signed)
Pt called in for a refill on her budesonide-formoterol (SYMBICORT) 160-4.5 MCG/ACT inhaler [[id number]]

## 2015-04-03 ENCOUNTER — Ambulatory Visit (INDEPENDENT_AMBULATORY_CARE_PROVIDER_SITE_OTHER): Payer: Medicare Other | Admitting: Internal Medicine

## 2015-04-03 DIAGNOSIS — J9611 Chronic respiratory failure with hypoxia: Secondary | ICD-10-CM

## 2015-04-03 LAB — PULMONARY FUNCTION TEST
DL/VA % PRED: 105 %
DL/VA: 4.57 ml/min/mmHg/L
DLCO UNC % PRED: 75 %
DLCO unc: 14.81 ml/min/mmHg
FEF 25-75 Post: 1.38 L/sec
FEF 25-75 Pre: 1.01 L/sec
FEF2575-%CHANGE-POST: 35 %
FEF2575-%Pred-Post: 120 %
FEF2575-%Pred-Pre: 88 %
FEV1-%Change-Post: 6 %
FEV1-%Pred-Post: 99 %
FEV1-%Pred-Pre: 93 %
FEV1-Post: 1.57 L
FEV1-Pre: 1.47 L
FEV1FVC-%CHANGE-POST: 6 %
FEV1FVC-%Pred-Pre: 100 %
FEV6-%CHANGE-POST: 0 %
FEV6-%PRED-POST: 99 %
FEV6-%Pred-Pre: 99 %
FEV6-Post: 1.99 L
FEV6-Pre: 1.99 L
FEV6FVC-%Pred-Post: 106 %
FEV6FVC-%Pred-Pre: 106 %
FVC-%Change-Post: 0 %
FVC-%PRED-POST: 93 %
FVC-%Pred-Pre: 93 %
FVC-POST: 1.99 L
FVC-Pre: 1.99 L
POST FEV1/FVC RATIO: 79 %
POST FEV6/FVC RATIO: 100 %
PRE FEV6/FVC RATIO: 100 %
Pre FEV1/FVC ratio: 74 %
RV % PRED: 87 %
RV: 1.96 L
TLC % pred: 83 %
TLC: 3.78 L

## 2015-04-03 NOTE — Progress Notes (Signed)
PFT done today. 

## 2015-04-09 ENCOUNTER — Telehealth: Payer: Self-pay | Admitting: *Deleted

## 2015-04-09 MED ORDER — BENAZEPRIL HCL 20 MG PO TABS: 20.0000 mg | ORAL_TABLET | Freq: Every day | ORAL | Status: DC

## 2015-04-09 NOTE — Telephone Encounter (Signed)
Minimal Obstructive Airways Disease Insignificant response to bronchodilator Minimal Diffusion Defect Normal lung volumes

## 2015-04-09 NOTE — Telephone Encounter (Signed)
Called pt no answer LMOM RTC.,../lmb 

## 2015-04-09 NOTE — Telephone Encounter (Signed)
Left msg on triage requesting PFT results that was done last week. Also requesting refill on her Benazepril. Sent refill on BP med pls advise on PFT results...Johny Chess

## 2015-04-10 DIAGNOSIS — H26491 Other secondary cataract, right eye: Secondary | ICD-10-CM | POA: Diagnosis not present

## 2015-04-10 DIAGNOSIS — E119 Type 2 diabetes mellitus without complications: Secondary | ICD-10-CM | POA: Diagnosis not present

## 2015-04-10 DIAGNOSIS — H35033 Hypertensive retinopathy, bilateral: Secondary | ICD-10-CM | POA: Diagnosis not present

## 2015-04-10 DIAGNOSIS — Z961 Presence of intraocular lens: Secondary | ICD-10-CM | POA: Diagnosis not present

## 2015-04-10 NOTE — Telephone Encounter (Signed)
Pt return call bck gave her md response...Johny Chess

## 2015-04-14 DIAGNOSIS — I5041 Acute combined systolic (congestive) and diastolic (congestive) heart failure: Secondary | ICD-10-CM | POA: Diagnosis not present

## 2015-04-23 ENCOUNTER — Telehealth: Payer: Self-pay | Admitting: *Deleted

## 2015-04-23 MED ORDER — APIXABAN 5 MG PO TABS
5.0000 mg | ORAL_TABLET | Freq: Two times a day (BID) | ORAL | Status: DC
Start: 2015-04-23 — End: 2015-06-19

## 2015-04-23 MED ORDER — FUROSEMIDE 80 MG PO TABS: 40.0000 mg | ORAL_TABLET | Freq: Every day | ORAL | Status: DC

## 2015-04-23 NOTE — Telephone Encounter (Signed)
Left msg on triage @ 8:13 stating needing updated scripts on her furosemide 80 mg & Elaquis. Med was rx by previously md. Called pt back verified pharmacy inform pt will [facility] to [facility].../lmb

## 2015-04-30 ENCOUNTER — Telehealth: Payer: Self-pay | Admitting: Internal Medicine

## 2015-04-30 DIAGNOSIS — I482 Chronic atrial fibrillation, unspecified: Secondary | ICD-10-CM

## 2015-04-30 NOTE — Telephone Encounter (Signed)
Referral placed for her A fib, not sure what other cardiac issues she has.

## 2015-04-30 NOTE — Telephone Encounter (Signed)
Patient is requesting a referral to a cardiologist.  She was talking with her home health nurse and was told she needs to be set up with a cardiologist because of all the issues she has. She would like to be with the [facility] and she has [facility]

## 2015-04-30 NOTE — Telephone Encounter (Signed)
Patient aware.

## 2015-05-04 ENCOUNTER — Encounter: Payer: Self-pay | Admitting: Cardiology

## 2015-05-04 ENCOUNTER — Ambulatory Visit (INDEPENDENT_AMBULATORY_CARE_PROVIDER_SITE_OTHER): Payer: Medicare Other | Admitting: Cardiology

## 2015-05-04 VITALS — BP 150/72 | HR 60 | Ht 61.0 in | Wt 203.4 lb

## 2015-05-04 DIAGNOSIS — I480 Paroxysmal atrial fibrillation: Secondary | ICD-10-CM

## 2015-05-04 DIAGNOSIS — E785 Hyperlipidemia, unspecified: Secondary | ICD-10-CM

## 2015-05-04 DIAGNOSIS — I10 Essential (primary) hypertension: Secondary | ICD-10-CM | POA: Diagnosis not present

## 2015-05-04 DIAGNOSIS — R0600 Dyspnea, unspecified: Secondary | ICD-10-CM

## 2015-05-04 MED ORDER — METOPROLOL SUCCINATE ER 25 MG PO TB24: 25.0000 mg | ORAL_TABLET | Freq: Every day | ORAL | Status: DC

## 2015-05-04 NOTE — Patient Instructions (Signed)
Medication Instructions:  Your physician recommends that you continue on your current medications as directed. Please refer to the Current Medication list given to you today.  Testing/Procedures: Your physician has recommended that you wear an event monitor for 30 days. Event monitors are medical devices that record the heart's electrical activity. Doctors most often [place] these monitors to diagnose arrhythmias. Arrhythmias are problems with the speed or rhythm of the heartbeat. The monitor is a small, portable device. You can wear one while you do your normal daily activities. This is usually used to diagnose what is causing palpitations/syncope (passing out).  Follow-Up: Follow up as needed.  Thank you for choosing [facility]!!

## 2015-05-04 NOTE — Progress Notes (Signed)
Cardiology Office Note   Date:  05/04/2015   ID:  TEQUILA ROTTMANN, DOB Sep 17, 1931, MRN [id number]  PCP:  Olga Millers, MD  Cardiologist:   Candee Furbish, MD       History of Present Illness: [name] Case is a 81 y.o. female who presents for follow-up visit for atrial fibrillation (? Dx) and dyspnea. She was seen in consultation by me on 01/06/15 in the hospital with worsening shortness of breath and cough over 24 hours duration. She was given IV Medrol as well as IV Lasix. One interesting fact was that her BNP was normal at 71. Her BUN was elevated however her creatinine was stable in the hospital. At that time, I believed that her shortness of breath was most likely more pulmonary related than cardiac. She has battled asthma for quite some time.  I currently question her diagnosis of  atrial fibrillation, chads score of 4. Anticoagulated.   She had no atrial fibrillation in hospital. Echocardiogram was reassuring in the hospital. Normal ejection fraction.  Recent PFTs in August 2016 showed minimal obstructive airways disease with , minimal diffusion defect.  She was prescribed Eliquis in the hospital however it appears that on office visit 03/08/15 she was not taking this medication. She is now back on anticoagulation until we clear up this diagnosis of atrial fibrillation.  She uses home oxygen. Desaturate with moderate activity walking around house.  Past Medical History  Diagnosis Date  . Allergy   . Asthma   . Hyperlipidemia   . Hypertension   . Thyroid disease   . GERD (gastroesophageal reflux disease)   . CHF (congestive heart failure)   . Bronchitis   . Atrial fibrillation   . Diabetes mellitus without complication     Past Surgical History  Procedure Laterality Date  . Appendectomy    . Cholecystectomy    . Abdominal hysterectomy    . Total knee arthroplasty       Current Outpatient Prescriptions  Medication Sig Dispense Refill  . apixaban  (ELIQUIS) 5 MG TABS tablet Take 1 tablet (5 mg total) by mouth 2 (two) times daily. 60 tablet 5  . benazepril (LOTENSIN) 20 MG tablet Take 1 tablet (20 mg total) by mouth daily. 90 tablet 3  . budesonide-formoterol (SYMBICORT) 160-4.5 MCG/ACT inhaler Inhale 2 puffs into the lungs 2 (two) times daily. 1 Inhaler 3  . furosemide (LASIX) 80 MG tablet Take 0.5 tablets (40 mg total) by mouth daily. 30 tablet 3  . levothyroxine (SYNTHROID, LEVOTHROID) 100 MCG tablet Take 1 tablet (100 mcg total) by mouth daily. 90 tablet 6  . metFORMIN (GLUCOPHAGE) 500 MG tablet Take 1 tablet (500 mg total) by mouth 2 (two) times daily with a meal. 60 tablet 0  . metoprolol succinate (TOPROL-XL) 25 MG 24 hr tablet Take 1 tablet (25 mg total) by mouth daily. 30 tablet 11   No current facility-administered medications for this visit.    Allergies:   Codeine    Social History:  The patient  reports that she has never smoked. She has never used smokeless tobacco. She reports that she does not drink alcohol or use illicit drugs.   Family History:  The patient's family history includes Cancer in her mother; Heart disease in her mother.    ROS:  Please see the history of present illness.   Otherwise, review of systems are positive for shortness of breath snoring.   All other systems are reviewed and negative.  PHYSICAL EXAM: VS:  BP 150/72 mmHg  Pulse 60  Ht 5' 1"  (1.549 m)  Wt 203 lb 6.4 oz (92.262 kg)  BMI 38.45 kg/m2  SpO2 95% , BMI Body mass index is 38.45 kg/(m^2). GEN: Well nourished, well developed, in no acute distress HEENT: normal Neck: no JVD, carotid bruits, or masses, oxygen Cardiac: RRR; no murmurs, rubs, or gallops,no edema  Respiratory:  clear to auscultation bilaterally, normal work of breathing GI: soft, nontender, nondistended, + BS obese MS: no deformity or atrophy Skin: warm and dry, no rash Neuro:  Strength and sensation are intact Psych: euthymic mood, full affect   EKG:  EKG is  not ordered today. Prior EKG shows sinus rhythm.   Recent Labs: 01/07/2015: Magnesium 1.8; TSH 1.090 01/20/2015: ALT 35; B Natriuretic Peptide 43.6; BUN 31*; Creatinine, Ser 1.02*; Hemoglobin 13.2; Platelets 221; Potassium 3.9; Sodium 137    Lipid Panel    Component Value Date/Time   CHOL 141 07/01/2012 1025   TRIG 130.0 07/01/2012 1025   HDL 41.00 07/01/2012 1025   CHOLHDL 3 07/01/2012 1025   VLDL 26.0 07/01/2012 1025   LDLCALC 74 07/01/2012 1025   LDLDIRECT 188.6 07/19/2007 0000      Wt Readings from Last 3 Encounters:  05/04/15 203 lb 6.4 oz (92.262 kg)  03/08/15 213 lb (96.616 kg)  01/20/15 210 lb (95.255 kg)    Echocardiogram: 01/08/15-EF 55%, no other abnormalities.  Other studies Reviewed: Additional studies/ records that were reviewed today include: Hospital records reviewed, lab work reviewed, echocardiogram reviewed. Review of the above records demonstrates: As above   ASSESSMENT AND PLAN:  1.  Dyspnea-BNP was reassuring, echocardiogram reassuring, mild PFT abnormalities consistent with COPD. Likely secondary to obesity and COPD. Trial of heavy Lasix IV in hospital resulted in no change in her breathing pattern (i.e., refutes diastolic heart failure). Continue to encourage conditioning, exercise, weight loss. When I saw her in the hospital, she was in sinus rhythm. Atrial fibrillation is not the reason for her shortness of breath. Home oxygen per primary physician.  2. Paroxysmal atrial fibrillation- at this point, I'm skeptical about this diagnosis. This is been carried over for quite some time. At one point, Dr. Arelia Sneddon had her on Coumadin according to my previous consult. I was able to find an EKG from 08/19/12 that was interpreted as possible atrial fibrillation however there are clear P waves preceding QRS complexes. Some baseline artifact is noted. She has not been in atrial fibrillation since we met in May 2016.  I will check a 30 day event monitor and if there is  no evidence of atrial fibrillation, we will stop her Eliquis.  This patients CHA2DS2-VASc Score and unadjusted Ischemic Stroke Rate (% per year) is equal to 4.8 % stroke rate/year from a score of 4, if she does truly have atrial fibrillation.  Above score calculated as 1 point each if present [CHF, HTN, DM, Vascular=MI/PAD/Aortic Plaque, Age if 65-74, or Female] Above score calculated as 2 points each if present [Age > 75, or Stroke/TIA/TE]  She does realize that if atrial fibrillation does actually occur in the future and she is not on anticoagulation, this could result in stroke.  3. Morbid obesity-continue to encourage weight loss. Home oxygen.  4. Asthma- My previous recommendation in the hospital was to stop her metoprolol and start diltiazem. She comes in today on metoprolol. It appears that this was not changed during her May hospitalization. If she does not have any evidence of atrial fibrillation  on her monitor, she may come off of the metoprolol.    Current medicines are reviewed at length with the patient today.  The patient does not have concerns regarding medicines.  The following changes have been made:  no change  Labs/ tests ordered today include:   Orders Placed This Encounter  Procedures  . Cardiac event monitor    I will follow with results of study, event monitor.  Bobby Rumpf, MD  05/04/2015 9:04 AM    [facility] [address], [place], [place]  [address] Phone: [phone]; Fax: [phone]

## 2015-05-08 ENCOUNTER — Ambulatory Visit (INDEPENDENT_AMBULATORY_CARE_PROVIDER_SITE_OTHER): Payer: Medicare Other

## 2015-05-08 DIAGNOSIS — I480 Paroxysmal atrial fibrillation: Secondary | ICD-10-CM | POA: Diagnosis not present

## 2015-05-10 ENCOUNTER — Telehealth: Payer: Self-pay | Admitting: Internal Medicine

## 2015-05-10 NOTE — Telephone Encounter (Signed)
Patient is having acute diarrhea for several days now and she's wondering if it could be one of her medications that is causing it. She's wondering if she can take an OTC such as imodium with that Please call her at [phone]

## 2015-05-10 NOTE — Telephone Encounter (Signed)
Pt informed

## 2015-05-10 NOTE — Telephone Encounter (Signed)
Yes, she can try imodium for the diarrhea. Most likely cause is viral which is going around right now. Metformin could be worsening and would recommend that she hold until feeling better then restart.

## 2015-05-10 NOTE — Telephone Encounter (Signed)
Left message for patient to call me back. 

## 2015-05-15 DIAGNOSIS — I5041 Acute combined systolic (congestive) and diastolic (congestive) heart failure: Secondary | ICD-10-CM | POA: Diagnosis not present

## 2015-05-18 ENCOUNTER — Encounter: Payer: Self-pay | Admitting: Internal Medicine

## 2015-05-18 ENCOUNTER — Telehealth: Payer: Self-pay | Admitting: Internal Medicine

## 2015-05-18 ENCOUNTER — Ambulatory Visit (INDEPENDENT_AMBULATORY_CARE_PROVIDER_SITE_OTHER): Payer: Medicare Other | Admitting: Internal Medicine

## 2015-05-18 VITALS — BP 170/82 | HR 57 | Temp 98.2°F | Resp 16 | Wt 202.0 lb

## 2015-05-18 DIAGNOSIS — Z87898 Personal history of other specified conditions: Secondary | ICD-10-CM

## 2015-05-18 DIAGNOSIS — J9611 Chronic respiratory failure with hypoxia: Secondary | ICD-10-CM | POA: Diagnosis not present

## 2015-05-18 DIAGNOSIS — I10 Essential (primary) hypertension: Secondary | ICD-10-CM

## 2015-05-18 DIAGNOSIS — Z8489 Family history of other specified conditions: Secondary | ICD-10-CM

## 2015-05-18 MED ORDER — AMLODIPINE BESYLATE 5 MG PO TABS: 5.0000 mg | ORAL_TABLET | Freq: Every day | ORAL | Status: DC

## 2015-05-18 NOTE — Telephone Encounter (Signed)
SE NOTE: All timestamps contained within this report are represented as [place] Standard Time. CONFIDENTIALTY NOTICE: This fax transmission is intended only for the addressee. It contains information that is legally privileged, confidential or otherwise protected from use or disclosure. If you are not the intended recipient, you are strictly prohibited from reviewing, disclosing, copying using or disseminating any of this information or taking any action in reliance on or regarding this information. If you have received this fax in error, please notify us immediately by telephone so that we can arrange for its return to [place]. Phone: [phone], Toll-Free: [phone], Fax: [phone] Page: 1 of 1 Call Id: [id number] [facility] [place] Patient Name: [name] Case DOB: Feb 07, 1932 Initial Comment Caller states her bp has been high, on Benazepril 20mg , 170/180 top number Nurse Assessment Nurse: Mechele Dawley, RN, Amy Date/Time (Eastern Time): 05/18/2015 9:57:28 AM Confirm and document reason for call. If symptomatic, describe symptoms. ---SHE IS HAVING SOME BP ISSUES FOR 2 DAYS. SHE IS HAVING 465K AND 354S SYSTOLICALLY AND DYSTOLIC 56-81. SHE TAKES BENAZEPRIL 20 MG ONCE DAILY. TRIAGE NURSE ASK FOR READING AT TIME OF THE CALL - 164/71. SHE DOES NOT KNOW WHEN IT HAS BEEN ELEVATED - SHE JUST DECIDED TO CHECK IT. NORMALLY IT STAYS BELOW 150. Has the patient traveled out of the country within the last 30 days? ---Not Applicable Does the patient require triage? ---Yes Related visit to physician within the last 2 weeks? ---No Does the PT have any chronic conditions? (i.e. diabetes, asthma, etc.) ---Yes List chronic conditions. ---HYPERTENSION, CHF, AFIB, THYROID, Guidelines Guideline Title Affirmed Question Affirmed Notes High Blood Pressure [1] BP # 130/80 AND [2] history of heart problems, kidney disease or  diabetes Final Disposition User See Physician within 4 Hours (or PCP triage) Mechele Dawley, RN, Amy Referrals REFERRED TO PCP OFFICE Disagree/Comply: Comply

## 2015-05-18 NOTE — Patient Instructions (Signed)
Minimal Blood Pressure Goal= AVERAGE < 140/90;  Ideal is an AVERAGE < 135/85. This AVERAGE should be calculated from @ least 5-7 BP readings taken @ different times of day on different days of week. You should not respond to isolated BP readings , but rather the AVERAGE for that week .Please bring your  blood pressure cuff to office visits to verify that it is reliable.It  can also be checked against the blood pressure device at the pharmacy. Finger or wrist cuffs are not dependable; an arm cuff is.  The Sleep Medicine referral will be scheduled and you'll be notified of the time.Please call the Referral Co-Ordinator @ [phone] if you have not been notified of appointment time within 7-10 days.

## 2015-05-18 NOTE — Telephone Encounter (Signed)
Please advise 

## 2015-05-18 NOTE — Progress Notes (Signed)
Pre visit review using our clinic review tool, if applicable. No additional management support is needed unless otherwise documented below in the visit note. 

## 2015-05-18 NOTE — Progress Notes (Signed)
   Subjective:    Patient ID: [name] Case, female    DOB: 1931/09/12, 81 y.o.   MRN: [id number]  HPI Her blood pressures have been elevated over the last 2 days; the range has been 163/58-182/74. She only had an isolated elevation last week. It seems to be increasing the last few days. She has been compliant with her medicines.  She has increased her fried food intake recently. She denies eating salt or excess red meat.  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 She's had surgery bilaterally.  She describes a sensation of "something hangs in my throat". She's also has a nonproductive cough. She is on an ACE inhibitor but has not had any definite angioedema symptoms  She describes nocturia 3-4 times over the past year. Today she had minor headache up to level I on a 10 scale.  She has a history of chronic respiratory failure and has been on oxygen. She states her sister has told her she has severe snoring and may have apnea.   Review of Systems  She denies chest pain, palpitations, frank dyspnea, or wheezing. She's not having paroxysmal nocturnal dyspnea  The throat symptoms are not associated with sore throat, otic pain, purulence from the nose, or head congestion. She has no associated vision changes or dizziness. She also denies edema. She has no numbness, tingling, weakness in extremities.    Objective:   Physical Exam Pertinent or positive findings include: She has wax in the left otic canal. Pupils are pinpoint. 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 She has complete dentures. The oropharynx is crowded with poor visualization. She has a grade 1/2 systolic murmur at the base. The second heart sound is increased. She has crepitus of the knees with fusiform change. 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 Deep tendon reflexes are 0+ at the left knee. The right knee reflex is 1/2+.  General appearance :adequately nourished; in no  distress.  Eyes: No conjunctival inflammation or scleral icterus is present.  Oral exam:  Lips and gums are healthy appearing.There is no oropharyngeal erythema or exudate noted.   Heart:  Normal rate and regular rhythm. S1  normal without gallop, click, rub or other extra sounds    Lungs:Chest clear to auscultation; no wheezes, rhonchi,rales ,or rubs present.No increased work of breathing.   Abdomen: bowel sounds normal, soft and non-tender without masses, organomegaly or hernias noted.  No guarding or rebound.   Vascular : all pulses equal ; no bruits present.  Skin:Warm & dry.  Intact without suspicious lesions or rashes ; no tenting or jaundice   Lymphatic: No lymphadenopathy is noted about the head, neck, axilla  Neuro: Strength, tone normal.     Assessment & Plan:  #1 uncontrolled hypertension; amlodipine will be added. Blood pressure goals discussed  #2 oropharyngeal changes with a history of snoring; rule out sleep apnea.Referral made

## 2015-05-18 NOTE — Telephone Encounter (Signed)
Can schedule nurse visit for BP check. If she wants can schedule visit next week.

## 2015-05-20 DIAGNOSIS — Z87898 Personal history of other specified conditions: Secondary | ICD-10-CM | POA: Insufficient documentation

## 2015-05-28 ENCOUNTER — Encounter: Payer: Self-pay | Admitting: Internal Medicine

## 2015-05-28 ENCOUNTER — Ambulatory Visit (INDEPENDENT_AMBULATORY_CARE_PROVIDER_SITE_OTHER): Payer: Medicare Other | Admitting: Internal Medicine

## 2015-05-28 VITALS — BP 134/80 | HR 66 | Ht 60.0 in | Wt 201.0 lb

## 2015-05-28 DIAGNOSIS — J9612 Chronic respiratory failure with hypercapnia: Secondary | ICD-10-CM | POA: Diagnosis not present

## 2015-05-28 DIAGNOSIS — I10 Essential (primary) hypertension: Secondary | ICD-10-CM | POA: Diagnosis not present

## 2015-05-28 DIAGNOSIS — R0600 Dyspnea, unspecified: Secondary | ICD-10-CM | POA: Diagnosis not present

## 2015-05-28 MED ORDER — IRBESARTAN 150 MG PO TABS: 150.0000 mg | ORAL_TABLET | Freq: Every day | ORAL | Status: DC

## 2015-05-28 NOTE — Patient Instructions (Addendum)
Stop benazapril and start avapro (ibesartan) 150 mg daily in it's place - if too strong break it in half  For now wear your 02 with sleep only for now, stop wearing it daytime unless you can't catch your breath by resting   Keep appt to see Dr Doug Sou (she can check your blood pressure)  and see [place] in 4 weeks to see how much better you are from the change we made today (it takes that long to see the full benefit)

## 2015-05-28 NOTE — Progress Notes (Signed)
   Subjective:    Patient ID: [name] Case, female    DOB: [birth year],   MRN: [id number]  HPI   21 yowf  Never smoker with tendency to bad "bronchitis" onset  around 2012 typically better p abx  and occurred again in April 2016 and never back to baseline so referred to pulmonary clinic  05/28/2015 by Dr Linna Darner.    05/28/2015 1st [facility] office visit/ [facility]   Chief Complaint  Patient presents with  . Pulmonary Consult    Referred by Dr. Linna Darner for SOB. Pt c/o SOB with exertion, occ chest tightness, and dry cough. Denies any chest congestion, wheezing, and sinus pressure/drainage.   breathing not right since April 2016 with doe where as could do WM now can't do HT due to sob  Despite symbicort and saba assoc with dry daytime coughing fits not typically hs   No obvious  patterns in day to day or daytime variabilty or assoc clasically pleuritic or ex  cp subjective wheeze overt sinus or hb symptoms. No unusual exp hx or h/o childhood pna/ asthma or knowledge of premature birth.  Sleeping ok without nocturnal  or early am exacerbation  of respiratory  c/o's or need for noct saba. Also denies any obvious fluctuation of symptoms with weather or environmental changes or other aggravating or alleviating factors except as outlined above   Current Medications, Allergies, Complete Past Medical History, Past Surgical History, Family History, and Social History were reviewed in [employer] record.             Review of Systems  Constitutional: Negative for fever and unexpected weight change.  HENT: Negative for congestion, dental problem, ear pain, postnasal drip, rhinorrhea, sinus pressure, sneezing, sore throat and trouble swallowing.   Eyes: Negative for photophobia and itching.  Respiratory: Positive for cough, chest tightness and shortness of breath. Negative for wheezing.   Cardiovascular: Positive for palpitations. Negative for leg swelling.    Gastrointestinal: Negative for nausea and vomiting.  Genitourinary: Negative for dysuria.  Musculoskeletal: Negative for joint swelling.  Skin: Negative for rash.  Neurological: Negative for headaches.  Hematological: Does not bruise/bleed easily.  Psychiatric/Behavioral: Negative for dysphoric mood. The patient is not nervous/anxious.        Objective:   Physical Exam  amb obese hoarse wf nad  Wt Readings from Last 3 Encounters:  05/28/15 201 lb (91.173 kg)  05/18/15 202 lb (91.627 kg)  05/04/15 203 lb 6.4 oz (92.262 kg)    Vital signs reviewed   HEENT: edentulous/ dentures in place, nl  turbinates, and orophanx. Nl external ear canals without cough reflex   NECK :  without JVD/Nodes/TM/ nl carotid upstrokes bilaterally   LUNGS: no acc muscle use, clear to A and P bilaterally without cough on insp or exp maneuvers - moderate pseudowheeze    CV:  RRR  no s3 or murmur or increase in P2, no edema   ABD:  Massively  obese but soft and nontender with nl excursion in the supine position. No bruits or organomegaly, bowel sounds nl  MS:  warm without deformities, calf tenderness, cyanosis or clubbing  SKIN: warm and dry without lesions    NEURO:  alert, approp, no deficits      I personally reviewed images and agree with radiology impression as follows:  CXR:  01/20/15 No active disease.      Assessment & Plan:

## 2015-05-29 NOTE — Assessment & Plan Note (Signed)
-    HCO3  33 01/20/2015 -  05/28/2015   Walked RA  2 laps @ 185 ft each stopped due to  Sob/fatigue not desat > rec 02 hs only   Most likely she has borderline obesity hypoventilation syndrome and needs to correct this by weight loss, not medications. See no indication for daytime oxygen however this point.

## 2015-05-29 NOTE — Assessment & Plan Note (Signed)
ACE inhibitors are problematic in  pts with airway complaints because  even experienced pulmonologists can't always distinguish ace effects from copd/asthma.  By themselves they don't actually cause a problem, much like oxygen can't by itself start a fire, but they certainly serve as a powerful catalyst or enhancer for any "fire"  or inflammatory process in the upper airway, be it caused by an ET  tube or more commonly reflux (especially in the obese or pts with known GERD or who are on biphoshonates).    In the era of ARB near equivalency until we have a better handle on the reversibility of the airway problem, it just makes sense to avoid ACEI  entirely in the short run and then decide later, having established a level of airway control using a reasonable limited regimen, whether to add back ace but even then being very careful to observe the pt for worsening airway control and number of meds used/ needed to control symptoms.    Try avapro 150 mg one daily and recheck bp with Dr Doug Sou in 2 weeks as planned

## 2015-05-29 NOTE — Assessment & Plan Note (Signed)
PFTs 04/03/15 with disproportionate reduction in ERV consistent with body habitus   Body mass index is 39.26 kg/(m^2).  Lab Results  Component Value Date   TSH 1.090 01/07/2015     Contributing to noct hypoxemia / doe/reviewed the need and the process to achieve and maintain neg calorie balance > defer f/u primary care including intermittently monitoring thyroid status

## 2015-05-29 NOTE — Assessment & Plan Note (Addendum)
-   echo 01/08/15 wnl -  PFTs 04/03/15 wnl x ERV 64% c/w obesity 05/28/2015  Walked RA x 2 laps @ 185 ft each stopped due to sob/ nl pace/ no desat  - d/c acei 05/28/2015   Symptoms are markedly disproportionate to objective findings and not clear this is a lung problem but pt does appear to have difficult airway management issues. DDX of  difficult airways management all start with A and  include Adherence, Ace Inhibitors, Acid Reflux, Active Sinus Disease, Alpha 1 Antitripsin deficiency, Anxiety masquerading as Airways dz,  ABPA,  allergy(esp in young), Aspiration (esp in elderly), Adverse effects of meds,  Active smokers, A bunch of PE's (a small clot burden can't cause this syndrome unless there is already severe underlying pulm or vascular dz with poor reserve) plus two Bs  = Bronchiectasis and Beta blocker use..and one C= CHF   Adherence is always the initial "prime suspect" and is a multilayered concern that requires a "trust but verify" approach in every patient - starting with knowing how to use medications, especially inhalers, correctly, keeping up with refills and understanding the fundamental difference between maintenance and prns vs those medications only taken for a very short course and then stopped and not refilled.   ACEi at the top of the usual list of suspects, needs trial off before further eval  ? Acid (or non-acid) GERD > always difficult to exclude as up to 75% of pts in some series report no assoc GI/ Heartburn symptoms> rec continue max (24h)  acid suppression and diet restrictions/ reviewed     ? Allergy/ asthma > unlikely so will probably be okay to stop Symbicort once we get this sorted out  ? A bunch of PE's > highly unlikely on eliquis  ? chf > ruled out but echo   Total time = 64m review case with pt/ discussion/ counseling/ giving and going over instructions (see avs)    Each maintenance medication was reviewed in detail including most importantly the difference  between maintenance and as needed and under what circumstances the prns are to be used.  Please see instructions for details which were reviewed in writing and the patient given a copy.

## 2015-06-08 ENCOUNTER — Other Ambulatory Visit (INDEPENDENT_AMBULATORY_CARE_PROVIDER_SITE_OTHER): Payer: Medicare Other

## 2015-06-08 ENCOUNTER — Encounter: Payer: Self-pay | Admitting: Internal Medicine

## 2015-06-08 ENCOUNTER — Ambulatory Visit (INDEPENDENT_AMBULATORY_CARE_PROVIDER_SITE_OTHER): Payer: Medicare Other | Admitting: Internal Medicine

## 2015-06-08 VITALS — BP 144/78 | HR 67 | Temp 98.0°F | Resp 12 | Ht 60.0 in | Wt 198.0 lb

## 2015-06-08 DIAGNOSIS — Z23 Encounter for immunization: Secondary | ICD-10-CM | POA: Diagnosis not present

## 2015-06-08 DIAGNOSIS — E039 Hypothyroidism, unspecified: Secondary | ICD-10-CM

## 2015-06-08 DIAGNOSIS — E785 Hyperlipidemia, unspecified: Secondary | ICD-10-CM

## 2015-06-08 DIAGNOSIS — I10 Essential (primary) hypertension: Secondary | ICD-10-CM

## 2015-06-08 DIAGNOSIS — E1165 Type 2 diabetes mellitus with hyperglycemia: Secondary | ICD-10-CM

## 2015-06-08 DIAGNOSIS — I480 Paroxysmal atrial fibrillation: Secondary | ICD-10-CM

## 2015-06-08 LAB — LIPID PANEL
CHOL/HDL RATIO: 4
Cholesterol: 209 mg/dL — ABNORMAL HIGH (ref 0–200)
HDL: 48.8 mg/dL (ref >39.00)
LDL Cholesterol: 131 mg/dL — ABNORMAL HIGH (ref 0–99)
NonHDL: 160.06
TRIGLYCERIDES: 147 mg/dL (ref 0.0–149.0)
VLDL: 29.4 mg/dL (ref 0.0–40.0)

## 2015-06-08 LAB — COMPREHENSIVE METABOLIC PANEL
ALT: 19 U/L (ref 0–35)
AST: 17 U/L (ref 0–37)
Albumin: 4.2 g/dL (ref 3.5–5.2)
Alkaline Phosphatase: 48 U/L (ref 39–117)
BILIRUBIN TOTAL: 0.3 mg/dL (ref 0.2–1.2)
BUN: 15 mg/dL (ref 6–23)
CALCIUM: 9.8 mg/dL (ref 8.4–10.5)
CHLORIDE: 102 meq/L (ref 96–112)
CO2: 35 meq/L — AB (ref 19–32)
Creatinine, Ser: 0.95 mg/dL (ref 0.40–1.20)
GFR: 59.91 mL/min — AB (ref >60.00)
GLUCOSE: 112 mg/dL — AB (ref 70–99)
POTASSIUM: 3.6 meq/L (ref 3.5–5.1)
Sodium: 144 mEq/L (ref 135–145)
Total Protein: 7.3 g/dL (ref 6.0–8.3)

## 2015-06-08 LAB — TSH: TSH: 0.9 u[IU]/mL (ref 0.35–4.50)

## 2015-06-08 LAB — T4, FREE: Free T4: 1.1 ng/dL (ref 0.60–1.60)

## 2015-06-08 LAB — HEMOGLOBIN A1C: HEMOGLOBIN A1C: 5.9 % (ref 4.6–6.5)

## 2015-06-08 LAB — MICROALBUMIN / CREATININE URINE RATIO
CREATININE, U: 71.2 mg/dL
MICROALB UR: 1.2 mg/dL (ref 0.0–1.9)
Microalb Creat Ratio: 1.7 mg/g (ref 0.0–30.0)

## 2015-06-08 NOTE — Assessment & Plan Note (Signed)
BP is mildly elevated today but has been well controlled. Will continue amlodipine, metoprolol, lasix, avapro. Would like to simplify regimen if able at next visit depending on her A fib results.

## 2015-06-08 NOTE — Progress Notes (Signed)
Pre visit review using our clinic review tool, if applicable. No additional management support is needed unless otherwise documented below in the visit note. 

## 2015-06-08 NOTE — Patient Instructions (Signed)
We will have you stop taking the metformin and we will call you back after the blood work comes back if you need something for the sugars.   We will keep the blood pressure medicines the same for today and hopefully they can be adjusted after we get with Dr. Marlou Porch.  We are checking the blood and the urine today for any signs of diabetes affecting the kidneys.   Come back in about 3 months for a check of the sugars.   Diabetes and Standards of Medical Care Diabetes is complicated. You may find that your diabetes team includes a dietitian, nurse, diabetes educator, eye doctor, and more. To help everyone know what is going on and to help you get the care you deserve, the following schedule of care was developed to help keep you on track. Below are the tests, exams, vaccines, medicines, education, and plans you will need. HbA1c test This test shows how well you have controlled your glucose over the past 2-3 months. It is used to see if your diabetes management plan needs to be adjusted.   It is performed at least 2 times a year if you are meeting treatment goals.  It is performed 4 times a year if therapy has changed or if you are not meeting treatment goals. Blood pressure test  This test is performed at every routine medical visit. The goal is less than 140/90 mm Hg for most people, but 130/80 mm Hg in some cases. Ask your health care provider about your goal. Dental exam  Follow up with the dentist regularly. Eye exam  If you are diagnosed with type 1 diabetes as a child, get an exam upon reaching the age of 71 years or older and having had diabetes for 3-5 years. Yearly eye exams are recommended after that initial eye exam.  If you are diagnosed with type 1 diabetes as an adult, get an exam within 5 years of diagnosis and then yearly.  If you are diagnosed with type 2 diabetes, get an exam as soon as possible after the diagnosis and then yearly. Foot care exam  Visual foot exams are  performed at every routine medical visit. The exams check for cuts, injuries, or other problems with the feet.  You should have a complete foot exam performed every year. This exam includes an inspection of the structure and skin of your feet, a check of the pulses in your feet, and a check of the sensation in your feet.  Type 1 diabetes: The first exam is performed 5 years after diagnosis.  Type 2 diabetes: The first exam is performed at the time of diagnosis.  Check your feet nightly for cuts, injuries, or other problems with your feet. Tell your health care provider if anything is not healing. Kidney function test (urine microalbumin)  This test is performed once a year.  Type 1 diabetes: The first test is performed 5 years after diagnosis.  Type 2 diabetes: The first test is performed at the time of diagnosis.  A serum creatinine and estimated glomerular filtration rate (eGFR) test is done once a year to assess the level of chronic kidney disease (CKD), if present. Lipid profile (cholesterol, HDL, LDL, triglycerides)  Performed every 5 years for most people.  The goal for LDL is less than 100 mg/dL. If you are at high risk, the goal is less than 70 mg/dL.  The goal for HDL is 40 mg/dL-50 mg/dL for men and 50 mg/dL-60 mg/dL for women. An  HDL cholesterol of 60 mg/dL or higher gives some protection against heart disease.  The goal for triglycerides is less than 150 mg/dL. Immunizations  The flu (influenza) vaccine is recommended yearly for every person 58 months of age or older who has diabetes.  The pneumonia (pneumococcal) vaccine is recommended for every person 27 years of age or older who has diabetes. Adults 47 years of age or older may receive the pneumonia vaccine as a series of two separate shots.  The hepatitis B vaccine is recommended for adults shortly after they have been diagnosed with diabetes.  The Tdap (tetanus, diphtheria, and pertussis) vaccine should be  given:  According to normal childhood vaccination schedules, for children.  Every 10 years, for adults who have diabetes. Diabetes self-management education  Education is recommended at diagnosis and ongoing as needed. Treatment plan  Your treatment plan is reviewed at every medical visit.   This information is not intended to replace advice given to you by your health care provider. Make sure you discuss any questions you have with your health care provider.   Document Released: 06/15/2009 Document Revised: 09/08/2014 Document Reviewed: 01/18/2013 Elsevier Interactive Patient Education [employer].

## 2015-06-08 NOTE — Assessment & Plan Note (Signed)
This diagnosis is under debate. Currently working with cardiology to see if true diagnosis. On eliquis and metoprolol. Normal sinus on exam today.

## 2015-06-08 NOTE — Assessment & Plan Note (Signed)
Not on statin, checking lipid panel today and adjust as needed.

## 2015-06-08 NOTE — Assessment & Plan Note (Signed)
Previous HgA1c at goal and will stop metformin due to GI side effects and add medicine back as needed based on HgA1c. Foot exam done, reminded about eye exam. Checking microalbumin to creatinine ratio.

## 2015-06-08 NOTE — Progress Notes (Signed)
   Subjective:    Patient ID: [name] Case, female    DOB: 11-19-1931, 81 y.o.   MRN: [id number]  HPI The patient is an 81 YO female coming in for follow up of several medical conditions. She is following on her sugars (taking metformin but having diarrhea from it which is not bearable, on avapro, no known complications), hypertension (BPs elevated recently with several medication changes, taking amlodipine, metoprolol, avapro, no side effects and no known complications), and her cholesterol (history of elevation, not on any medication for it right now, complicated by diabtetes, goal LDL <100). She is still having some SOB on exertion and has just stopped her ACE-I per pulmonary and they do not think she has asthma. Overall she is not exercising much and does not try to exert herself due to the breathing.   Review of Systems  Constitutional: Positive for activity change. Negative for fever, appetite change, fatigue and unexpected weight change.  HENT: Negative.   Eyes: Negative.   Respiratory: Positive for shortness of breath. Negative for cough, chest tightness and wheezing.   Cardiovascular: Negative for chest pain, palpitations and leg swelling.  Gastrointestinal: Positive for diarrhea. Negative for nausea, abdominal pain, constipation and abdominal distention.  Musculoskeletal: Negative.   Skin: Negative.   Neurological: Negative.   Psychiatric/Behavioral: Negative.       Objective:   Physical Exam  Constitutional: She is oriented to person, place, and time. She appears well-developed and well-nourished.  Overweight  HENT:  Head: Normocephalic and atraumatic.  Eyes: EOM are normal.  Neck: Normal range of motion. No JVD present.  Cardiovascular: Normal rate and regular rhythm.   Pulmonary/Chest: Effort normal and breath sounds normal. No respiratory distress. She has no wheezes. She has no rales.  Abdominal: Soft. She exhibits no distension. There is no tenderness. There is no  rebound.  Lymphadenopathy:    She has no cervical adenopathy.  Neurological: She is alert and oriented to person, place, and time. Coordination normal.  Skin: Skin is warm and dry.  Psychiatric: She has a normal mood and affect.   Filed Vitals:   06/08/15 1041 06/08/15 1121  BP: 160/70 144/78  Pulse: 67   Temp: 98 F (36.7 C)   TempSrc: Oral   Resp: 12   Height: 5' (1.524 m)   Weight: 198 lb (89.812 kg)   SpO2: 92%       Assessment & Plan:  Flu shot given at visit.

## 2015-06-08 NOTE — Assessment & Plan Note (Signed)
Checking TSH and free T4 today and adjust as needed.

## 2015-06-12 ENCOUNTER — Telehealth: Payer: Self-pay | Admitting: *Deleted

## 2015-06-12 NOTE — Telephone Encounter (Signed)
Left msg on triage stating saw md on 06/08/15 had labs done. Wanting to get results...[name] Case

## 2015-06-13 NOTE — Telephone Encounter (Signed)
Notified pt with labs results. Pt states she hasn't had any cholesterol med in 2 years. Does she need to be on cholesterol med or can she control it with diet & exercise & what type of diet she need to follow....[name] Case

## 2015-06-13 NOTE — Telephone Encounter (Signed)
Sent you a result note about this.

## 2015-06-13 NOTE — Telephone Encounter (Signed)
Pt has called again requesting results. Pls advise...[name] Case

## 2015-06-14 DIAGNOSIS — I5041 Acute combined systolic (congestive) and diastolic (congestive) heart failure: Secondary | ICD-10-CM | POA: Diagnosis not present

## 2015-06-14 NOTE — Telephone Encounter (Signed)
She can try diet and exercise and at next recheck if not better we would recommend that.

## 2015-06-14 NOTE — Telephone Encounter (Signed)
Notified pt with md response.../lmb 

## 2015-06-19 ENCOUNTER — Other Ambulatory Visit: Payer: Self-pay | Admitting: *Deleted

## 2015-06-25 ENCOUNTER — Ambulatory Visit (INDEPENDENT_AMBULATORY_CARE_PROVIDER_SITE_OTHER): Payer: Medicare Other | Admitting: Internal Medicine

## 2015-06-25 ENCOUNTER — Encounter: Payer: Self-pay | Admitting: Internal Medicine

## 2015-06-25 VITALS — BP 142/68 | HR 64 | Ht 60.75 in | Wt 202.0 lb

## 2015-06-25 DIAGNOSIS — I10 Essential (primary) hypertension: Secondary | ICD-10-CM

## 2015-06-25 DIAGNOSIS — R0600 Dyspnea, unspecified: Secondary | ICD-10-CM

## 2015-06-25 DIAGNOSIS — J9612 Chronic respiratory failure with hypercapnia: Secondary | ICD-10-CM

## 2015-06-25 NOTE — Progress Notes (Signed)
Subjective:    Patient ID: [name] Case, female    DOB: 1932/01/21    MRN: [id number]    Brief patient profile:   69 yowf  Never smoker with tendency to bad "bronchitis" onset  around 2012 typically better p abx  and occurred again in April 2016 and never back to baseline so referred to pulmonary clinic  05/28/2015 by Dr Linna Darner.   History of Present Illness  05/28/2015 1st [facility] office visit/ [name] Case   Chief Complaint  Patient presents with  . Pulmonary Consult    Referred by Dr. Linna Darner for SOB. Pt c/o SOB with exertion, occ chest tightness, and dry cough. Denies any chest congestion, wheezing, and sinus pressure/drainage.   breathing not right since April 2016 with doe where as could do WM now can't do HT due to sob  Despite symbicort and saba assoc with dry daytime coughing fits not typically hs  rec Stop benazapril and start avapro (ibesartan) 150 mg daily in it's place - if too strong break it in half For now wear your 02 with sleep only for now, stop wearing it daytime unless you can't catch your breath by resting  Keep appt to see Dr Doug Sou (she can check your blood pressure)  and see [place] in 4 weeks to see how much better you are from the change we made today (it takes that long to see the full benefit)     06/25/2015  f/u ov/[name] Case re: ACEi cough resolved  Chief Complaint  Patient presents with  . Follow-up    Pt states her breathing is back to her normal baseline. She is no longer coughing or having chest tightness.     Not limited by breathing from desired activities    No obvious day to day or daytime variability or assoc chronic cough or cp or chest tightness, subjective wheeze or overt sinus or hb symptoms. No unusual exp hx or h/o childhood pna/ asthma or knowledge of premature birth.  Sleeping ok without nocturnal  or early am exacerbation  of respiratory  c/o's or need for noct saba. Also denies any obvious fluctuation of symptoms with weather or environmental  changes or other aggravating or alleviating factors except as outlined above   Current Medications, Allergies, Complete Past Medical History, Past Surgical History, Family History, and Social History were reviewed in [employer] record.  ROS  The following are not active complaints unless bolded sore throat, dysphagia, dental problems, itching, sneezing,  nasal congestion or excess/ purulent secretions, ear ache,   fever, chills, sweats, unintended wt loss, classically pleuritic or exertional cp, hemoptysis,  orthopnea pnd or leg swelling, presyncope, palpitations, abdominal pain, anorexia, nausea, vomiting, diarrhea  or change in bowel or bladder habits, change in stools or urine, dysuria,hematuria,  rash, arthralgias, visual complaints, headache, numbness, weakness or ataxia or problems with walking or coordination,  change in mood/affect or memory.                 Objective:   Physical Exam  amb obese wf nad   06/25/2015      202 Wt Readings from Last 3 Encounters:  05/28/15 201 lb (91.173 kg)  05/18/15 202 lb (91.627 kg)  05/04/15 203 lb 6.4 oz (92.262 kg)    Vital signs reviewed   HEENT: edentulous/ dentures in place, nl  turbinates, and orophanx. Nl external ear canals without cough reflex   NECK :  without JVD/Nodes/TM/ nl carotid upstrokes bilaterally  LUNGS: no acc muscle use, clear to A and P bilaterally without cough on insp or exp maneuvers     CV:  RRR  no s3 or murmur or increase in P2, no edema   ABD:  Massively  obese but soft and nontender with nl excursion in the supine position. No bruits or organomegaly, bowel sounds nl  MS:  warm without deformities, calf tenderness, cyanosis or clubbing  SKIN: warm and dry without lesions    NEURO:  alert, approp, no deficits      I personally reviewed images and agree with radiology impression as follows:  CXR:  01/20/15 No active disease.      Assessment & Plan:

## 2015-06-25 NOTE — Patient Instructions (Signed)
Weight control is simply a matter of calorie balance which needs to be tilted in your favor by eating less and exercising more.  To get the most out of exercise, you need to be continuously aware that you are short of breath, but never out of breath, for 30 minutes daily. As you improve, it will actually be easier for you to do the same amount of exercise  in  30 minutes so always push to the level where you are short of breath.  If this does not result in gradual weight reduction then I strongly recommend you see a nutritionist with a food diary x 2 weeks so that we can work out a negative calorie balance which is universally effective in steady weight loss programs.  Think of your calorie balance like you do your bank account where in this case you want the balance to go down so you must take in less calories than you burn up.  It's just that simple:  Hard to do, but easy to understand.  Good luck!   Ok to just symbicort 160 up to 2 pffs every 12 hours  Ok to stop the 02 but work on the weight loss in the meantime    If you are satisfied with your treatment plan,  let your doctor know and he/she can either refill your medications or you can return here when your prescription runs out.     If in any way you are not 100% satisfied,  please tell us.  If 100% better, tell your friends!  Pulmonary follow up is as needed

## 2015-07-02 NOTE — Assessment & Plan Note (Signed)
-    HCO3  33 01/20/2015 -  05/28/2015   Walked RA  2 laps @ 185 ft each stopped due to  Sob/fatigue not desat > rec 02 hs only - d/c 02 06/25/2015

## 2015-07-02 NOTE — Assessment & Plan Note (Signed)
-   echo 01/08/15 wnl -  PFTs 04/03/15 wnl x ERV 64% c/w obesity -  05/28/2015  Walked RA x 2 laps @ 185 ft each stopped due to sob/ nl pace/ no desat  - d/c acei 05/28/2015 > resolved   I had an extended final summary discussion with the patient reviewing all relevant studies completed to date and  lasting 15 to 20 minutes of a 25 minute visit on the following issues:    Back to baseline and needs to focus on reconditioning/ wt loss next   Each maintenance medication was reviewed in detail including most importantly the difference between maintenance and as needed and under what circumstances the prns are to be used.  Please see instructions for details which were reviewed in writing and the patient given a copy.

## 2015-07-02 NOTE — Assessment & Plan Note (Signed)
Trial off acei 05/28/2015 due to ? Pseudoasthma> resolved    In the best review of chronic cough to date (week of 06/18/15 [facility]) ,  ACEi are rated at causing cough in 20% of pts which is a 4 fold increase from previous reports and does not include the variety of non-specific complaints we see in pulmonary clinic in pts on ACEi but previously attributed to copd/asthma to include PNDS, throat and chest congestion, "bronchitis", unexplained dyspnea and noct "strangling" sensations as well as atypical /refractory GERD symptoms like upper dysphagia.   Adequate control on present rx, reviewed > no change in rx needed>>>  Would leave off acei indefinitely

## 2015-07-02 NOTE — Assessment & Plan Note (Signed)
HCO03 33 01/20/15 PFTs 04/03/15 with disproportionate reduction in ERV consistent with body habitus  So she most likely has borderline ohs   Calorie balance issues addressed  Body mass index is 38.49 slt improvement   Lab Results  Component Value Date   TSH 0.90 06/08/2015     Contributing also  to gerd tendency/ doe/reviewed the need and the process to achieve and maintain neg calorie balance > defer f/u primary care including intermittently monitoring thyroid status

## 2015-07-09 ENCOUNTER — Telehealth: Payer: Self-pay | Admitting: Cardiology

## 2015-07-09 NOTE — Telephone Encounter (Signed)
Pt aware to continue

## 2015-07-09 NOTE — Telephone Encounter (Signed)
Continue Metoprolol 

## 2015-07-09 NOTE — Telephone Encounter (Signed)
New message      Pt c/o medication issue:  1. Name of Medication: metoprolol 2. How are you currently taking this medication (dosage and times per day)? 25mg  daily 3. Are you having a reaction (difficulty breathing--STAT)? no  4. What is your medication issue? Pt states that Dr Marlou Porch took her off the eliquis.  She forgot to ask if she should stop the metoprolol

## 2015-08-13 ENCOUNTER — Other Ambulatory Visit: Payer: Self-pay | Admitting: Internal Medicine

## 2015-08-16 ENCOUNTER — Institutional Professional Consult (permissible substitution): Payer: Medicare Other | Admitting: Pulmonary Disease

## 2015-09-07 ENCOUNTER — Other Ambulatory Visit: Payer: Self-pay | Admitting: Internal Medicine

## 2015-09-10 ENCOUNTER — Ambulatory Visit: Payer: Medicare Other | Admitting: Internal Medicine

## 2015-09-20 ENCOUNTER — Ambulatory Visit (INDEPENDENT_AMBULATORY_CARE_PROVIDER_SITE_OTHER): Payer: Medicare Other | Admitting: Internal Medicine

## 2015-09-20 ENCOUNTER — Other Ambulatory Visit (INDEPENDENT_AMBULATORY_CARE_PROVIDER_SITE_OTHER): Payer: Medicare Other

## 2015-09-20 ENCOUNTER — Encounter: Payer: Self-pay | Admitting: Internal Medicine

## 2015-09-20 VITALS — BP 130/60 | HR 57 | Temp 97.9°F | Resp 16 | Ht 60.75 in | Wt 205.1 lb

## 2015-09-20 DIAGNOSIS — E039 Hypothyroidism, unspecified: Secondary | ICD-10-CM | POA: Diagnosis not present

## 2015-09-20 DIAGNOSIS — E119 Type 2 diabetes mellitus without complications: Secondary | ICD-10-CM

## 2015-09-20 DIAGNOSIS — I10 Essential (primary) hypertension: Secondary | ICD-10-CM

## 2015-09-20 LAB — HEMOGLOBIN A1C: Hgb A1c MFr Bld: 5.7 % (ref 4.6–6.5)

## 2015-09-20 MED ORDER — METFORMIN HCL 500 MG PO TABS: 500.0000 mg | ORAL_TABLET | Freq: Every day | ORAL | Status: DC

## 2015-09-20 NOTE — Progress Notes (Signed)
Pre visit review using our clinic review tool, if applicable. No additional management support is needed unless otherwise documented below in the visit note. 

## 2015-09-20 NOTE — Patient Instructions (Signed)
We will check the sugars today and call you back about the results.   It is okay to take tylenol for the shoulder pain. If it gets worse we do have a sports medicine doctor, Dr. Tamala Julian here in the office who may be able to help it.

## 2015-09-23 NOTE — Assessment & Plan Note (Signed)
Checking HgA1c and adjust as needed. ON ARB and metformin. No complications at this time.

## 2015-09-23 NOTE — Assessment & Plan Note (Signed)
Does not need labs today for this. Last TSH at goal with 100 mcg daily synthroid. No symptoms of over or under replacement.

## 2015-09-23 NOTE — Assessment & Plan Note (Signed)
Controlled on amlodipine, irbesartan, lasix, metoprolol. No side effects and recent BMP.

## 2015-09-23 NOTE — Progress Notes (Signed)
   Subjective:    Patient ID: [name] Case, female    DOB: 1932/03/08, 82 y.o.   MRN: [id number]  HPI The patient is an 82 YO female coming in for follow up of her blood pressure (controlled on lasix, amlodipine, irbesartan, metoprolol, not complicated), her diabetes (doing well on metformin and irbesartan, not complicated), and her thyroid (on synthroid 100 mcg daily and no symptoms of over or under replacement). No new concerns but several questions about her medicines.   Review of Systems  Constitutional: Positive for activity change. Negative for fever, appetite change, fatigue and unexpected weight change.  HENT: Negative.   Eyes: Negative.   Respiratory: Negative for cough, chest tightness, shortness of breath and wheezing.   Cardiovascular: Negative for chest pain, palpitations and leg swelling.  Gastrointestinal: Negative for nausea, abdominal pain, diarrhea, constipation and abdominal distention.  Musculoskeletal: Negative.   Skin: Negative.   Neurological: Negative.   Psychiatric/Behavioral: Negative.       Objective:   Physical Exam  Constitutional: She is oriented to person, place, and time. She appears well-developed and well-nourished.  Overweight  HENT:  Head: Normocephalic and atraumatic.  Eyes: EOM are normal.  Neck: Normal range of motion. No JVD present.  Cardiovascular: Normal rate and regular rhythm.   Pulmonary/Chest: Effort normal and breath sounds normal. No respiratory distress. She has no wheezes. She has no rales.  Abdominal: Soft. She exhibits no distension. There is no tenderness. There is no rebound.  Lymphadenopathy:    She has no cervical adenopathy.  Neurological: She is alert and oriented to person, place, and time. Coordination normal.  Skin: Skin is warm and dry.  Psychiatric: She has a normal mood and affect.   Filed Vitals:   09/20/15 1449  BP: 130/60  Pulse: 57  Temp: 97.9 F (36.6 C)  TempSrc: Oral  Resp: 16  Height: 5' 0.75"  (1.543 m)  Weight: 205 lb 1.9 oz (93.042 kg)  SpO2: 95%      Assessment & Plan:

## 2015-10-10 ENCOUNTER — Ambulatory Visit: Payer: Medicare Other | Admitting: Family Medicine

## 2015-10-27 ENCOUNTER — Ambulatory Visit: Payer: Medicare Other | Admitting: Internal Medicine

## 2015-10-30 ENCOUNTER — Encounter (HOSPITAL_COMMUNITY): Payer: Self-pay | Admitting: Emergency Medicine

## 2015-10-30 ENCOUNTER — Emergency Department (HOSPITAL_COMMUNITY)
Admission: EM | Admit: 2015-10-30 | Discharge: 2015-10-30 | Disposition: A | Discharge status: Home / Self Care | Payer: Medicare Other | Source: Home / Self Care | Attending: Emergency Medicine | Admitting: Emergency Medicine

## 2015-10-30 DIAGNOSIS — J410 Simple chronic bronchitis: Secondary | ICD-10-CM | POA: Diagnosis not present

## 2015-10-30 DIAGNOSIS — J9801 Acute bronchospasm: Secondary | ICD-10-CM

## 2015-10-30 DIAGNOSIS — J069 Acute upper respiratory infection, unspecified: Secondary | ICD-10-CM

## 2015-10-30 MED ORDER — IPRATROPIUM-ALBUTEROL 0.5-2.5 (3) MG/3ML IN SOLN
3.0000 mL | Freq: Once | RESPIRATORY_TRACT | Status: AC
  Administered 2015-10-30: 3 mL via RESPIRATORY_TRACT

## 2015-10-30 MED ORDER — IPRATROPIUM-ALBUTEROL 0.5-2.5 (3) MG/3ML IN SOLN
RESPIRATORY_TRACT | Status: AC
  Filled 2015-10-30: qty 3

## 2015-10-30 MED ORDER — METHYLPREDNISOLONE ACETATE 80 MG/ML IJ SUSP
INTRAMUSCULAR | Status: AC
  Filled 2015-10-30: qty 1

## 2015-10-30 MED ORDER — METHYLPREDNISOLONE ACETATE 80 MG/ML IJ SUSP
80.0000 mg | Freq: Once | INTRAMUSCULAR | Status: AC
  Administered 2015-10-30: 80 mg via INTRAMUSCULAR

## 2015-10-30 MED ORDER — PREDNISONE 20 MG PO TABS: ORAL_TABLET | ORAL | Status: DC

## 2015-10-30 NOTE — ED Provider Notes (Signed)
CSN: [id number]     Arrival date & time 10/30/15  1411 History   First MD Initiated Contact with Patient 10/30/15 1617     Chief Complaint  Patient presents with  . URI   (Consider location/radiation/quality/duration/timing/severity/associated sxs/prior Treatment) HPI Comments: 82 year old female with a history of bronchitis presents with cough, wheezing, chest congestion, upper respiratory congestion, runny nose, stuffy nose and ear discomfort. Denies fever or chills. Symptoms began a little less than 1 week ago. Coughing and wheezing have progressed.   Past Medical History  Diagnosis Date  . Allergy   . Asthma   . Hyperlipidemia   . Hypertension   . Thyroid disease   . GERD (gastroesophageal reflux disease)   . CHF (congestive heart failure) ([place])   . Bronchitis   . Atrial fibrillation ([place])   . Diabetes mellitus without complication [facility])    Past Surgical History  Procedure Laterality Date  . Appendectomy    . Cholecystectomy    . Abdominal hysterectomy    . Total knee arthroplasty     Family History  Problem Relation Age of Onset  . Cancer Mother     colon  . Heart disease Mother    Social History  Substance Use Topics  . Smoking status: Never Smoker   . Smokeless tobacco: Never Used  . Alcohol Use: No   OB History    No data available     Review of Systems  Constitutional: Positive for activity change. Negative for fever, chills, appetite change and fatigue.  HENT: Positive for congestion, ear pain, postnasal drip and rhinorrhea. Negative for facial swelling and sore throat.   Eyes: Negative.   Respiratory: Positive for cough, shortness of breath and wheezing.   Cardiovascular: Negative.   Musculoskeletal: Negative for neck pain and neck stiffness.  Skin: Negative for pallor and rash.  Neurological: Negative.   All other systems reviewed and are negative.   Allergies  Codeine  Home Medications   Prior to Admission medications   Medication Sig  Start Date End Date Taking? Authorizing Provider  amLODipine (NORVASC) 5 MG tablet TAKE 1 TABLET (5 MG TOTAL) BY MOUTH DAILY. 08/13/15  Yes Hoyt Koch, MD  budesonide-formoterol [facility]) 160-4.5 MCG/ACT inhaler Inhale 2 puffs into the lungs 2 (two) times daily. 03/21/15  Yes Hoyt Koch, MD  furosemide (LASIX) 80 MG tablet Take 0.5 tablets (40 mg total) by mouth daily. 04/23/15  Yes Hoyt Koch, MD  levothyroxine (SYNTHROID, LEVOTHROID) 100 MCG tablet TAKE 1 TABLET EVERY DAY 09/07/15  Yes Hoyt Koch, MD  metFORMIN (GLUCOPHAGE) 500 MG tablet Take 1 tablet (500 mg total) by mouth daily with breakfast. 09/20/15  Yes Hoyt Koch, MD  metoprolol succinate (TOPROL-XL) 25 MG 24 hr tablet Take 1 tablet (25 mg total) by mouth daily. 05/04/15  Yes Jerline Pain, MD  irbesartan (AVAPRO) 150 MG tablet Take 1 tablet (150 mg total) by mouth daily. 05/28/15   Tanda Rockers, MD  predniSONE (DELTASONE) 20 MG tablet Take 3 tabs po on first day, 2 tabs second day, 2 tabs third day, 1 tab fourth day, 1 tab 5th day. Take with food. Start 10/31/15. 10/30/15   Janne Napoleon, NP   Meds Ordered and Administered this Visit   Medications  ipratropium-albuterol (DUONEB) 0.5-2.5 (3) MG/3ML nebulizer solution 3 mL (3 mLs Nebulization Given 10/30/15 1639)  methylPREDNISolone acetate (DEPO-MEDROL) injection 80 mg (80 mg Intramuscular Given 10/30/15 1639)    BP 157/73 mmHg  Pulse 59  Temp(Src) 98 F (36.7 C) (Oral)  Resp 16  SpO2 100% No data found.   Physical Exam  Constitutional: She is oriented to person, place, and time. She appears well-developed and well-nourished. No distress.  HENT:  Mouth/Throat: No oropharyngeal exudate.  Bilateral TMs without erythema. Positive for minor retraction. Oropharynx with minimal erythema and moderate clear PND.  Eyes: Conjunctivae and EOM are normal.  Neck: Normal range of motion. Neck supple.  Cardiovascular: Normal rate.   Difficult to  actually hear heart sounds including S1 and S2 due to sounds emanating from the throat and airways during expiration.  Pulmonary/Chest: Effort normal. No respiratory distress. She has wheezes. She has no rales.  Musculoskeletal: Normal range of motion. She exhibits no edema.  Lymphadenopathy:    She has no cervical adenopathy.  Neurological: She is alert and oriented to person, place, and time. She exhibits normal muscle tone.  Skin: Skin is warm and dry. No rash noted.  Psychiatric: She has a normal mood and affect.  Nursing note and vitals reviewed.   ED Course  Procedures (including critical care time)  Labs Review Labs Reviewed - No data to display  Imaging Review No results found.   Visual Acuity Review  Right Eye Distance:   Left Eye Distance:   Bilateral Distance:    Right Eye Near:   Left Eye Near:    Bilateral Near:         MDM   1. URI (upper respiratory infection)   2. Bronchospasm   3. Simple chronic bronchitis (HCC)    Status post DuoNeb 2.5/5 mg patient states she is feeling much better. Indeed, she has substantial improvement in air movement and decrease and wheeze. Her cough has settled down significantly. This appears to be viral. She developed a URI and this exacerbated her bronchitis. After her lungs cleared cardiac auscultation reveals clear S1 and S2, regular rhythm and rate. Bronchospasm, Adult User albuterol inhaler 2 puffs every 4-6 hours as needed for cough and wheeze. Start taking the prednisone tablets tomorrow. Be sure to take it with food. For drainage recommend taking Claritin or Allegra. If he needs something a little stronger you may take Chlor-Trimeton 2-4 mg every 4-6 hours. Do not recommend decongestants at this time due to your history of atrial fibrillation. Recommend using copious amounts of saline nasal spray. May also use a for nasal spray if he were stopped up and cannot breathe. For any worsening, new symptoms or problems may  return.    Janne Napoleon, NP 10/30/15 1726

## 2015-10-30 NOTE — ED Notes (Signed)
C/o cold x6 days associated w/prod cough, wheezing, congestion, runny nose, bilateral ear pain Denies fevers A&O x4... No acute distress.

## 2015-10-30 NOTE — Discharge Instructions (Signed)
Bronchospasm, Adult User albuterol inhaler 2 puffs every 4-6 hours as needed for cough and wheeze. Start taking the prednisone tablets tomorrow. Be sure to take it with food. For drainage recommend taking Claritin or Allegra. If he needs something a little stronger you may take Chlor-Trimeton 2-4 mg every 4-6 hours. Do not recommend decongestants at this time due to your history of atrial fibrillation. Recommend using copious amounts of saline nasal spray. May also use a for nasal spray if he were stopped up and cannot breathe. For any worsening, new symptoms or problems may return.  A bronchospasm is a spasm or tightening of the airways going into the lungs. During a bronchospasm breathing becomes more difficult because the airways get smaller. When this happens there can be coughing, a whistling sound when breathing (wheezing), and difficulty breathing. Bronchospasm is often associated with asthma, but not all patients who experience a bronchospasm have asthma. CAUSES  A bronchospasm is caused by inflammation or irritation of the airways. The inflammation or irritation may be triggered by:  1. Allergies (such as to animals, pollen, food, or mold). Allergens that cause bronchospasm may cause wheezing immediately after exposure or many hours later.  2. Infection. Viral infections are believed to be the most common cause of bronchospasm.  3. Exercise.  4. Irritants (such as pollution, cigarette smoke, strong odors, aerosol sprays, and paint fumes).  5. Weather changes. Winds increase molds and pollens in the air. Rain refreshes the air by washing irritants out. Cold air may cause inflammation.  6. Stress and emotional upset.  SIGNS AND SYMPTOMS   Wheezing.   Excessive nighttime coughing.   Frequent or severe coughing with a simple cold.   Chest tightness.   Shortness of breath.  DIAGNOSIS  Bronchospasm is usually diagnosed through a history and physical exam. Tests, such as  chest X-rays, are sometimes done to look for other conditions. TREATMENT   Inhaled medicines can be given to open up your airways and help you breathe. The medicines can be given using either an inhaler or a nebulizer machine.  Corticosteroid medicines may be given for severe bronchospasm, usually when it is associated with asthma. HOME CARE INSTRUCTIONS   Always have a plan prepared for seeking medical care. Know when to call your health care provider and local emergency services (911 in the [place] Know where you can access local emergency care.  Only take medicines as directed by your health care provider.  If you were prescribed an inhaler or nebulizer machine, ask your health care provider to explain how to use it correctly. Always use a spacer with your inhaler if you were given one.  It is necessary to remain calm during an attack. Try to relax and breathe more slowly.  Control your home environment in the following ways:   Change your heating and air conditioning filter at least once a month.   Limit your use of fireplaces and wood stoves.  Do not smoke and do not allow smoking in your home.   Avoid exposure to perfumes and fragrances.   Get rid of pests (such as roaches and mice) and their droppings.   Throw away plants if you see mold on them.   Keep your house clean and dust free.   Replace carpet with wood, tile, or vinyl flooring. Carpet can trap dander and dust.   Use allergy-proof pillows, mattress covers, and box spring covers.   Wash bed sheets and blankets every week in hot water and dry them  in a dryer.   Use blankets that are made of polyester or cotton.   Wash hands frequently. SEEK MEDICAL CARE IF:   You have muscle aches.   You have chest pain.   The sputum changes from clear or white to yellow, green, gray, or bloody.   The sputum you cough up gets thicker.   There are problems that may be related to the medicine you are given,  such as a rash, itching, swelling, or trouble breathing.  SEEK IMMEDIATE MEDICAL CARE IF:   You have worsening wheezing and coughing even after taking your prescribed medicines.   You have increased difficulty breathing.   You develop severe chest pain. MAKE SURE YOU:   Understand these instructions.  Will watch your condition.  Will get help right away if you are not doing well or get worse.   This information is not intended to replace advice given to you by your health care provider. Make sure you discuss any questions you have with your health care provider.   Document Released: 08/21/2003 Document Revised: 09/08/2014 Document Reviewed: 02/07/2013 Elsevier Interactive Patient Education 2016 [employer]  Upper Respiratory Infection, Adult Most upper respiratory infections (URIs) are a viral infection of the air passages leading to the lungs. A URI affects the nose, throat, and upper air passages. The most common type of URI is nasopharyngitis and is typically referred to as "the common cold." URIs run their course and usually go away on their own. Most of the time, a URI does not require medical attention, but sometimes a bacterial infection in the upper airways can follow a viral infection. This is called a secondary infection. Sinus and middle ear infections are common types of secondary upper respiratory infections. Bacterial pneumonia can also complicate a URI. A URI can worsen asthma and chronic obstructive pulmonary disease (COPD). Sometimes, these complications can require emergency medical care and may be life threatening.  CAUSES Almost all URIs are caused by viruses. A virus is a type of germ and can spread from one person to another.  RISKS FACTORS You may be at risk for a URI if:  7. You smoke.  8. You have chronic heart or lung disease. 9. You have a weakened defense (immune) system.  55. You are very young or very old.  11. You have nasal allergies or  asthma. 61. You work in crowded or poorly ventilated areas. 25. You work in health care facilities or schools. SIGNS AND SYMPTOMS  Symptoms typically develop 2-3 days after you come in contact with a cold virus. Most viral URIs last 7-10 days. However, viral URIs from the influenza virus (flu virus) can last 14-18 days and are typically more severe. Symptoms may include:   Runny or stuffy (congested) nose.   Sneezing.   Cough.   Sore throat.   Headache.   Fatigue.   Fever.   Loss of appetite.   Pain in your forehead, behind your eyes, and over your cheekbones (sinus pain).  Muscle aches.  DIAGNOSIS  Your health care provider may diagnose a URI by:  Physical exam.  Tests to check that your symptoms are not due to another condition such as:  Strep throat.  Sinusitis.  Pneumonia.  Asthma. TREATMENT  A URI goes away on its own with time. It cannot be cured with medicines, but medicines may be prescribed or recommended to relieve symptoms. Medicines may help:  Reduce your fever.  Reduce your cough.  Relieve nasal congestion. HOME CARE  INSTRUCTIONS   Take medicines only as directed by your health care provider.   Gargle warm saltwater or take cough drops to comfort your throat as directed by your health care provider.  Use a warm mist humidifier or inhale steam from a shower to increase air moisture. This may make it easier to breathe.  Drink enough fluid to keep your urine clear or pale yellow.   Eat soups and other clear broths and maintain good nutrition.   Rest as needed.   Return to work when your temperature has returned to normal or as your health care provider advises. You may need to stay home longer to avoid infecting others. You can also use a face mask and careful hand washing to prevent spread of the virus.  Increase the usage of your inhaler if you have asthma.   Do not use any tobacco products, including cigarettes, chewing  tobacco, or electronic cigarettes. If you need help quitting, ask your health care provider. PREVENTION  The best way to protect yourself from getting a cold is to practice good hygiene.   Avoid oral or hand contact with people with cold symptoms.   Wash your hands often if contact occurs.  There is no clear evidence that vitamin C, vitamin E, echinacea, or exercise reduces the chance of developing a cold. However, it is always recommended to get plenty of rest, exercise, and practice good nutrition.  SEEK MEDICAL CARE IF:   You are getting worse rather than better.   Your symptoms are not controlled by medicine.   You have chills.  You have worsening shortness of breath.  You have brown or red mucus.  You have yellow or brown nasal discharge.  You have pain in your face, especially when you bend forward.  You have a fever.  You have swollen neck glands.  You have pain while swallowing.  You have white areas in the back of your throat. SEEK IMMEDIATE MEDICAL CARE IF:   You have severe or persistent:  Headache.  Ear pain.  Sinus pain.  Chest pain.  You have chronic lung disease and any of the following:  Wheezing.  Prolonged cough.  Coughing up blood.  A change in your usual mucus.  You have a stiff neck.  You have changes in your:  Vision.  Hearing.  Thinking.  Mood. MAKE SURE YOU:   Understand these instructions.  Will watch your condition.  Will get help right away if you are not doing well or get worse.   This information is not intended to replace advice given to you by your health care provider. Make sure you discuss any questions you have with your health care provider.   Document Released: 02/11/2001 Document Revised: 01/02/2015 Document Reviewed: 11/23/2013 Elsevier Interactive Patient Education 2016 [employer].  How to Use an Inhaler Using your inhaler correctly is very important. Good technique will make sure that the  medicine reaches your lungs.  HOW TO USE AN INHALER: 14. Take the cap off the inhaler. 15. If this is the first time using your inhaler, you need to prime it. Shake the inhaler for 5 seconds. Release four puffs into the air, away from your face. Ask your doctor for help if you have questions. 16. Shake the inhaler for 5 seconds. 17. Turn the inhaler so the bottle is above the mouthpiece. 18. Put your pointer finger on top of the bottle. Your thumb holds the bottom of the inhaler. 19. Open your mouth. 20. Either  hold the inhaler away from your mouth (the width of 2 fingers) or place your lips tightly around the mouthpiece. Ask your doctor which way to use your inhaler. 21. Breathe out as much air as possible. 22. Breathe in and push down on the bottle 1 time to release the medicine. You will feel the medicine go in your mouth and throat. 23. Continue to take a deep breath in very slowly. Try to fill your lungs. 24. After you have breathed in completely, hold your breath for 10 seconds. This will help the medicine to settle in your lungs. If you cannot hold your breath for 10 seconds, hold it for as long as you can before you breathe out. 25. Breathe out slowly, through pursed lips. Whistling is an example of pursed lips. 26. If your doctor has told you to take more than 1 puff, wait at least 15-30 seconds between puffs. This will help you get the best results from your medicine. Do not use the inhaler more than your doctor tells you to. 27. Put the cap back on the inhaler. 28. Follow the directions from your doctor or from the inhaler package about cleaning the inhaler. If you use more than one inhaler, ask your doctor which inhalers to use and what order to use them in. Ask your doctor to help you figure out when you will need to refill your inhaler.  If you use a steroid inhaler, always rinse your mouth with water after your last puff, gargle and spit out the water. Do not swallow the water. GET  HELP IF:  The inhaler medicine only partially helps to stop wheezing or shortness of breath.  You are having trouble using your inhaler.  You have some increase in thick spit (phlegm). GET HELP RIGHT AWAY IF:  The inhaler medicine does not help your wheezing or shortness of breath or you have tightness in your chest.  You have dizziness, headaches, or fast heart rate.  You have chills, fever, or night sweats.  You have a large increase of thick spit, or your thick spit is bloody. MAKE SURE YOU:   Understand these instructions.  Will watch your condition.  Will get help right away if you are not doing well or get worse.   This information is not intended to replace advice given to you by your health care provider. Make sure you discuss any questions you have with your health care provider.   Document Released: 05/27/2008 Document Revised: 06/08/2013 Document Reviewed: 03/17/2013 Elsevier Interactive Patient Education [employer].

## 2015-12-07 ENCOUNTER — Other Ambulatory Visit: Payer: Self-pay | Admitting: Geriatric Medicine

## 2015-12-07 ENCOUNTER — Telehealth: Payer: Self-pay | Admitting: Internal Medicine

## 2015-12-07 MED ORDER — LEVOTHYROXINE SODIUM 100 MCG PO TABS: 100.0000 ug | ORAL_TABLET | Freq: Every day | ORAL | Status: DC

## 2015-12-07 NOTE — Telephone Encounter (Signed)
Sent to [facility].  

## 2015-12-07 NOTE — Telephone Encounter (Signed)
Requesting script for levothyroxine to be sent over.  Fax number [phone]

## 2015-12-17 ENCOUNTER — Other Ambulatory Visit: Payer: Self-pay

## 2015-12-17 MED ORDER — FUROSEMIDE 80 MG PO TABS: 40.0000 mg | ORAL_TABLET | Freq: Every day | ORAL | Status: DC

## 2016-01-08 ENCOUNTER — Encounter (HOSPITAL_COMMUNITY): Payer: Self-pay

## 2016-01-08 ENCOUNTER — Inpatient Hospital Stay (HOSPITAL_COMMUNITY)
Admission: EM | Admit: 2016-01-08 | Discharge: 2016-01-12 | DRG: 291 | Disposition: A | Discharge status: Home / Self Care | Payer: Medicare Other | Attending: Internal Medicine | Admitting: Internal Medicine

## 2016-01-08 ENCOUNTER — Emergency Department (HOSPITAL_COMMUNITY): Payer: Medicare Other

## 2016-01-08 DIAGNOSIS — I509 Heart failure, unspecified: Secondary | ICD-10-CM

## 2016-01-08 DIAGNOSIS — I480 Paroxysmal atrial fibrillation: Secondary | ICD-10-CM | POA: Diagnosis present

## 2016-01-08 DIAGNOSIS — K219 Gastro-esophageal reflux disease without esophagitis: Secondary | ICD-10-CM | POA: Diagnosis present

## 2016-01-08 DIAGNOSIS — Z79899 Other long term (current) drug therapy: Secondary | ICD-10-CM | POA: Diagnosis not present

## 2016-01-08 DIAGNOSIS — R0602 Shortness of breath: Secondary | ICD-10-CM | POA: Diagnosis not present

## 2016-01-08 DIAGNOSIS — I493 Ventricular premature depolarization: Secondary | ICD-10-CM | POA: Diagnosis not present

## 2016-01-08 DIAGNOSIS — Z7984 Long term (current) use of oral hypoglycemic drugs: Secondary | ICD-10-CM | POA: Diagnosis not present

## 2016-01-08 DIAGNOSIS — E876 Hypokalemia: Secondary | ICD-10-CM | POA: Diagnosis not present

## 2016-01-08 DIAGNOSIS — J4520 Mild intermittent asthma, uncomplicated: Secondary | ICD-10-CM | POA: Diagnosis not present

## 2016-01-08 DIAGNOSIS — D649 Anemia, unspecified: Secondary | ICD-10-CM | POA: Diagnosis present

## 2016-01-08 DIAGNOSIS — E119 Type 2 diabetes mellitus without complications: Secondary | ICD-10-CM | POA: Diagnosis present

## 2016-01-08 DIAGNOSIS — J45909 Unspecified asthma, uncomplicated: Secondary | ICD-10-CM | POA: Diagnosis not present

## 2016-01-08 DIAGNOSIS — I5032 Chronic diastolic (congestive) heart failure: Secondary | ICD-10-CM

## 2016-01-08 DIAGNOSIS — R000 Tachycardia, unspecified: Secondary | ICD-10-CM | POA: Diagnosis not present

## 2016-01-08 DIAGNOSIS — I110 Hypertensive heart disease with heart failure: Secondary | ICD-10-CM | POA: Diagnosis not present

## 2016-01-08 DIAGNOSIS — E039 Hypothyroidism, unspecified: Secondary | ICD-10-CM | POA: Diagnosis not present

## 2016-01-08 DIAGNOSIS — E785 Hyperlipidemia, unspecified: Secondary | ICD-10-CM | POA: Diagnosis not present

## 2016-01-08 DIAGNOSIS — I5033 Acute on chronic diastolic (congestive) heart failure: Secondary | ICD-10-CM | POA: Diagnosis present

## 2016-01-08 DIAGNOSIS — E662 Morbid (severe) obesity with alveolar hypoventilation: Secondary | ICD-10-CM | POA: Diagnosis present

## 2016-01-08 DIAGNOSIS — J9601 Acute respiratory failure with hypoxia: Secondary | ICD-10-CM | POA: Diagnosis present

## 2016-01-08 DIAGNOSIS — I4891 Unspecified atrial fibrillation: Secondary | ICD-10-CM | POA: Diagnosis present

## 2016-01-08 DIAGNOSIS — R079 Chest pain, unspecified: Secondary | ICD-10-CM | POA: Diagnosis not present

## 2016-01-08 DIAGNOSIS — Z6837 Body mass index (BMI) 37.0-37.9, adult: Secondary | ICD-10-CM

## 2016-01-08 LAB — BASIC METABOLIC PANEL
Anion gap: 8 (ref 5–15)
BUN: 13 mg/dL (ref 6–20)
CO2: 31 mmol/L (ref 22–32)
Calcium: 8.9 mg/dL (ref 8.9–10.3)
Chloride: 103 mmol/L (ref 101–111)
Creatinine, Ser: 0.9 mg/dL (ref 0.44–1.00)
GFR calc Af Amer: >60 mL/min (ref >60)
GFR, EST NON AFRICAN AMERICAN: 58 mL/min — AB (ref >60)
Glucose, Bld: 123 mg/dL — ABNORMAL HIGH (ref 65–99)
POTASSIUM: 3.3 mmol/L — AB (ref 3.5–5.1)
SODIUM: 142 mmol/L (ref 135–145)

## 2016-01-08 LAB — CBC
HEMATOCRIT: 35.2 % — AB (ref 36.0–46.0)
HEMOGLOBIN: 11.1 g/dL — AB (ref 12.0–15.0)
MCH: 30.5 pg (ref 26.0–34.0)
MCHC: 31.5 g/dL (ref 30.0–36.0)
MCV: 96.7 fL (ref 78.0–100.0)
Platelets: 246 10*3/uL (ref 150–400)
RBC: 3.64 MIL/uL — ABNORMAL LOW (ref 3.87–5.11)
RDW: 14 % (ref 11.5–15.5)
WBC: 8.7 10*3/uL (ref 4.0–10.5)

## 2016-01-08 LAB — I-STAT TROPONIN, ED: Troponin i, poc: 0 ng/mL (ref 0.00–0.08)

## 2016-01-08 LAB — BRAIN NATRIURETIC PEPTIDE: B NATRIURETIC PEPTIDE 5: 306.5 pg/mL — AB (ref 0.0–100.0)

## 2016-01-08 MED ORDER — FUROSEMIDE 10 MG/ML IJ SOLN
40.0000 mg | INTRAMUSCULAR | Status: AC
  Administered 2016-01-08: 40 mg via INTRAVENOUS
  Filled 2016-01-08: qty 4

## 2016-01-08 MED ORDER — POTASSIUM CHLORIDE CRYS ER 20 MEQ PO TBCR
40.0000 meq | EXTENDED_RELEASE_TABLET | Freq: Once | ORAL | Status: AC
  Administered 2016-01-08: 40 meq via ORAL
  Filled 2016-01-08: qty 2

## 2016-01-08 NOTE — ED Notes (Signed)
During triage, pts HR increased to 160 for about 45 seconds but during this time she denied feeling any chest pain or "thumping" in her chest.

## 2016-01-08 NOTE — ED Notes (Signed)
Pt ambulated with a sat of 96%

## 2016-01-08 NOTE — ED Provider Notes (Signed)
CSN: [id number]     Arrival date & time 01/08/16  2125 History   First MD Initiated Contact with Patient 01/08/16 2158     Chief Complaint  Patient presents with  . Chest Pain    HPI   [name] Case is a 82 y.o. female with a PMH of HLD, HTN, CHF, atrial fibrillation who presents to the ED with shortness of breath and chest pain. She notes worsening shortness of breath for the past several days. She also reports dizziness and lightheadedness when she moves from sitting to standing. She states she experienced right sided chest pain tonight prior to arrival, which she reports is now resolved. She denies exacerbating or alleviating factors. She states she has been taking all of her medications as directed. She denies fever, chills, congestion, cough, abdominal pain, N/V/D/C.  PCP: Pricilla Holm, MD Cardiologist: Candee Furbish, MD  Past Medical History  Diagnosis Date  . Allergy   . Asthma   . Hyperlipidemia   . Hypertension   . Thyroid disease   . GERD (gastroesophageal reflux disease)   . CHF (congestive heart failure) ([place])   . Bronchitis   . Atrial fibrillation ([place])   . Diabetes mellitus without complication [facility])    Past Surgical History  Procedure Laterality Date  . Appendectomy    . Cholecystectomy    . Abdominal hysterectomy    . Total knee arthroplasty     Family History  Problem Relation Age of Onset  . Cancer Mother     colon  . Heart disease Mother    Social History  Substance Use Topics  . Smoking status: Never Smoker   . Smokeless tobacco: Never Used  . Alcohol Use: No   OB History    No data available       Review of Systems  Constitutional: Negative for fever and chills.  HENT: Negative for congestion.   Respiratory: Positive for shortness of breath. Negative for cough.   Cardiovascular: Positive for chest pain. Negative for leg swelling.  Gastrointestinal: Negative for nausea, vomiting, abdominal pain, diarrhea and constipation.   Neurological: Positive for dizziness and light-headedness. Negative for syncope, weakness and numbness.  All other systems reviewed and are negative.     Allergies  Codeine  Home Medications   Prior to Admission medications   Medication Sig Start Date End Date Taking? Authorizing Provider  amLODipine (NORVASC) 5 MG tablet TAKE 1 TABLET (5 MG TOTAL) BY MOUTH DAILY. 08/13/15  Yes Hoyt Koch, MD  budesonide-formoterol [facility]) 160-4.5 MCG/ACT inhaler Inhale 2 puffs into the lungs 2 (two) times daily. 03/21/15  Yes Hoyt Koch, MD  furosemide (LASIX) 80 MG tablet Take 0.5 tablets (40 mg total) by mouth daily. 12/17/15  Yes Hoyt Koch, MD  irbesartan (AVAPRO) 150 MG tablet Take 1 tablet (150 mg total) by mouth daily. 05/28/15  Yes Tanda Rockers, MD  levothyroxine (SYNTHROID, LEVOTHROID) 100 MCG tablet Take 1 tablet (100 mcg total) by mouth daily. 12/07/15  Yes Hoyt Koch, MD  metoprolol succinate (TOPROL-XL) 25 MG 24 hr tablet Take 1 tablet (25 mg total) by mouth daily. 05/04/15  Yes Jerline Pain, MD  metFORMIN (GLUCOPHAGE) 500 MG tablet Take 1 tablet (500 mg total) by mouth daily with breakfast. Patient not taking: Reported on 01/08/2016 09/20/15   Hoyt Koch, MD  predniSONE (DELTASONE) 20 MG tablet Take 3 tabs po on first day, 2 tabs second day, 2 tabs third day, 1 tab fourth day,  1 tab 5th day. Take with food. Start 10/31/15. Patient not taking: Reported on 01/08/2016 10/30/15   Janne Napoleon, NP    BP 130/64 mmHg  Pulse 79  Temp(Src) 98.1 F (36.7 C) (Oral)  Resp 20  Ht 5\' 3"  (1.6 m)  Wt 95.936 kg  BMI 37.48 kg/m2  SpO2 98% Physical Exam  Constitutional: She is oriented to person, place, and time. She appears well-developed and well-nourished. No distress.  HENT:  Head: Normocephalic and atraumatic.  Right Ear: External ear normal.  Left Ear: External ear normal.  Nose: Nose normal.  Mouth/Throat: Uvula is midline, oropharynx is clear and  moist and mucous membranes are normal.  Eyes: Conjunctivae, EOM and lids are normal. Pupils are equal, round, and reactive to light. Right eye exhibits no discharge. Left eye exhibits no discharge. No scleral icterus.  Neck: Normal range of motion. Neck supple.  Cardiovascular: Normal rate, normal heart sounds, intact distal pulses and normal pulses.  An irregularly irregular rhythm present.  Pulmonary/Chest: Effort normal and breath sounds normal. No respiratory distress. She has no wheezes. She has no rales. She exhibits no tenderness.  Abdominal: Soft. Normal appearance and bowel sounds are normal. She exhibits no distension and no mass. There is no tenderness. There is no rigidity, no rebound and no guarding.  Musculoskeletal: Normal range of motion. She exhibits edema. She exhibits no tenderness.  1+ pitting edema to lower extremities bilaterally.  Neurological: She is alert and oriented to person, place, and time. She has normal strength. No sensory deficit.  Skin: Skin is warm, dry and intact. No rash noted. She is not diaphoretic. No erythema. No pallor.  Psychiatric: She has a normal mood and affect. Her speech is normal and behavior is normal.  Nursing note and vitals reviewed.   ED Course  Procedures (including critical care time)  Labs Review Labs Reviewed  BASIC METABOLIC PANEL - Abnormal; Notable for the following:    Potassium 3.3 (*)    Glucose, Bld 123 (*)    GFR calc non Af Amer 58 (*)    All other components within normal limits  CBC - Abnormal; Notable for the following:    RBC 3.64 (*)    Hemoglobin 11.1 (*)    HCT 35.2 (*)    All other components within normal limits  BRAIN NATRIURETIC PEPTIDE - Abnormal; Notable for the following:    B Natriuretic Peptide 306.5 (*)    All other components within normal limits  D-DIMER, QUANTITATIVE (NOT AT [facility]) - Abnormal; Notable for the following:    D-Dimer, Quant 0.52 (*)    All other components within normal limits   COMPREHENSIVE METABOLIC PANEL - Abnormal; Notable for the following:    Potassium 3.3 (*)    Glucose, Bld 161 (*)    Calcium 8.7 (*)    Total Protein 6.0 (*)    Albumin 3.1 (*)    All other components within normal limits  CBC WITH DIFFERENTIAL/PLATELET - Abnormal; Notable for the following:    RBC 3.38 (*)    Hemoglobin 10.9 (*)    HCT 33.0 (*)    All other components within normal limits  MRSA PCR SCREENING  TROPONIN I  TSH  HEPARIN LEVEL (UNFRACTIONATED)  TROPONIN I  TROPONIN I  I-STAT TROPOININ, ED    Imaging Review Dg Chest 2 View  01/08/2016  CLINICAL DATA:  Right sided chest pain and tachycardia beginning tonight. Congestive heart failure. Atrial fibrillation. EXAM: CHEST  2 VIEW COMPARISON:  01/20/2015 FINDINGS: Mild cardiomegaly remains stable. Increased mixed interstitial and airspace disease is seen involving the left lung greater than right. This may be due to asymmetric edema or less likely infection. No evidence of pleural effusion. IMPRESSION: New asymmetric interstitial and airspace disease, left side greater than right. This is most likely due to asymmetric edema, with infection considered less likely. Stable cardiomegaly. Electronically Signed   By: Earle Gell M.D.   On: 01/08/2016 21:59   I have personally reviewed and evaluated these images and lab results as part of my medical decision-making.   EKG Interpretation   Date/Time:  Tuesday Jan 08 2016 21:31:31 EDT Ventricular Rate:  111 PR Interval:    QRS Duration: 84 QT Interval:  294 QTC Calculation: 399 R Axis:   70 Text Interpretation:  Atrial fibrillation with rapid ventricular response  ST \\T \ T wave abnormality, consider inferior ischemia Abnormal ECG No  acute changes No significant change since last tracing Confirmed by  Kathrynn Humble, MD, Thelma Comp [phone]) on 01/08/2016 10:12:30 PM      MDM   Final diagnoses:  Atrial fibrillation, unspecified type ([place])  Acute on chronic congestive heart failure,  unspecified congestive heart failure type [facility])    82 year old female presents with shortness of breath and chest pain. Notes shortness of breath for the past few days, and states her chest pain started tonight prior to arrival and is now resolved. Last echo May 2016, EF 50-55%.  Patient is afebrile. HR 109. Heart irregularly irregular. Lungs clear to auscultation bilaterally. Abdomen soft, non-tender, non-distended. 1+ pitting edema to lower extremities bilaterally.  EKG atrial fibrillation with RVR, HR 111.  Troponin negative. CBC negative for leukocytosis, hemoglobin 11.1. BMP remarkable for potassium 3.3, patient given oral potassium in the ED. BNP elevated at 306.5. CXR with new asymmetric interstitial and airspace disease, left greater than right, may be due to asymmetric edema or less likely infection.  Patient given dose of IV lasix. Hospitalist consulted for admission. Spoke with Dr. Hal Hope, patient to be admitted for further evaluation and management. HR 120s-130s. Started on diltiazem drip per Dr. Hal Hope.  Patient discussed with and seen by Dr. Kathrynn Humble.   BP 130/64 mmHg  Pulse 79  Temp(Src) 98.1 F (36.7 C) (Oral)  Resp 20  Ht 5\' 3"  (1.6 m)  Wt 95.936 kg  BMI 37.48 kg/m2  SpO2 98%      Marella Chimes, PA-C 01/09/16 [place], MD 01/10/16 [phone]

## 2016-01-08 NOTE — ED Notes (Signed)
Called main lab to have BNP added on.

## 2016-01-08 NOTE — ED Notes (Addendum)
Explained 2 visitors at a time to family for safe delivery of care.

## 2016-01-08 NOTE — ED Notes (Signed)
Pt here with c/o non-radiating right sided chest pain "thumping" associated with SOB that started tonight around 2030 when she was laying in the bed.

## 2016-01-08 NOTE — ED Notes (Signed)
Patient not in room yet 

## 2016-01-09 ENCOUNTER — Inpatient Hospital Stay (HOSPITAL_COMMUNITY): Payer: Medicare Other

## 2016-01-09 ENCOUNTER — Encounter (HOSPITAL_COMMUNITY): Payer: Self-pay | Admitting: Emergency Medicine

## 2016-01-09 DIAGNOSIS — J4520 Mild intermittent asthma, uncomplicated: Secondary | ICD-10-CM | POA: Diagnosis not present

## 2016-01-09 DIAGNOSIS — R0600 Dyspnea, unspecified: Secondary | ICD-10-CM | POA: Diagnosis not present

## 2016-01-09 DIAGNOSIS — I480 Paroxysmal atrial fibrillation: Secondary | ICD-10-CM | POA: Diagnosis not present

## 2016-01-09 DIAGNOSIS — E039 Hypothyroidism, unspecified: Secondary | ICD-10-CM | POA: Diagnosis present

## 2016-01-09 DIAGNOSIS — E876 Hypokalemia: Secondary | ICD-10-CM | POA: Diagnosis not present

## 2016-01-09 DIAGNOSIS — I5033 Acute on chronic diastolic (congestive) heart failure: Secondary | ICD-10-CM

## 2016-01-09 DIAGNOSIS — I5032 Chronic diastolic (congestive) heart failure: Secondary | ICD-10-CM

## 2016-01-09 DIAGNOSIS — E119 Type 2 diabetes mellitus without complications: Secondary | ICD-10-CM | POA: Diagnosis present

## 2016-01-09 DIAGNOSIS — E785 Hyperlipidemia, unspecified: Secondary | ICD-10-CM | POA: Diagnosis not present

## 2016-01-09 DIAGNOSIS — I4891 Unspecified atrial fibrillation: Secondary | ICD-10-CM | POA: Diagnosis not present

## 2016-01-09 DIAGNOSIS — I493 Ventricular premature depolarization: Secondary | ICD-10-CM | POA: Diagnosis present

## 2016-01-09 DIAGNOSIS — R0602 Shortness of breath: Secondary | ICD-10-CM | POA: Diagnosis present

## 2016-01-09 DIAGNOSIS — J45909 Unspecified asthma, uncomplicated: Secondary | ICD-10-CM | POA: Diagnosis present

## 2016-01-09 DIAGNOSIS — E662 Morbid (severe) obesity with alveolar hypoventilation: Secondary | ICD-10-CM | POA: Diagnosis present

## 2016-01-09 DIAGNOSIS — Z7984 Long term (current) use of oral hypoglycemic drugs: Secondary | ICD-10-CM | POA: Diagnosis not present

## 2016-01-09 DIAGNOSIS — D649 Anemia, unspecified: Secondary | ICD-10-CM | POA: Diagnosis present

## 2016-01-09 DIAGNOSIS — I110 Hypertensive heart disease with heart failure: Secondary | ICD-10-CM | POA: Diagnosis present

## 2016-01-09 DIAGNOSIS — K219 Gastro-esophageal reflux disease without esophagitis: Secondary | ICD-10-CM | POA: Diagnosis present

## 2016-01-09 DIAGNOSIS — I509 Heart failure, unspecified: Secondary | ICD-10-CM

## 2016-01-09 DIAGNOSIS — J9601 Acute respiratory failure with hypoxia: Secondary | ICD-10-CM | POA: Diagnosis not present

## 2016-01-09 DIAGNOSIS — Z6837 Body mass index (BMI) 37.0-37.9, adult: Secondary | ICD-10-CM | POA: Diagnosis not present

## 2016-01-09 DIAGNOSIS — Z79899 Other long term (current) drug therapy: Secondary | ICD-10-CM | POA: Diagnosis not present

## 2016-01-09 LAB — GLUCOSE, CAPILLARY
GLUCOSE-CAPILLARY: 120 mg/dL — AB (ref 65–99)
GLUCOSE-CAPILLARY: 108 mg/dL — AB (ref 65–99)
GLUCOSE-CAPILLARY: 124 mg/dL — AB (ref 65–99)
Glucose-Capillary: 123 mg/dL — ABNORMAL HIGH (ref 65–99)

## 2016-01-09 LAB — COMPREHENSIVE METABOLIC PANEL
ALT: 21 U/L (ref 14–54)
ANION GAP: 12 (ref 5–15)
AST: 20 U/L (ref 15–41)
Albumin: 3.1 g/dL — ABNORMAL LOW (ref 3.5–5.0)
Alkaline Phosphatase: 50 U/L (ref 38–126)
BILIRUBIN TOTAL: 0.5 mg/dL (ref 0.3–1.2)
BUN: 11 mg/dL (ref 6–20)
CHLORIDE: 104 mmol/L (ref 101–111)
CO2: 28 mmol/L (ref 22–32)
Calcium: 8.7 mg/dL — ABNORMAL LOW (ref 8.9–10.3)
Creatinine, Ser: 0.86 mg/dL (ref 0.44–1.00)
GFR calc Af Amer: >60 mL/min (ref >60)
Glucose, Bld: 161 mg/dL — ABNORMAL HIGH (ref 65–99)
POTASSIUM: 3.3 mmol/L — AB (ref 3.5–5.1)
Sodium: 144 mmol/L (ref 135–145)
TOTAL PROTEIN: 6 g/dL — AB (ref 6.5–8.1)

## 2016-01-09 LAB — TROPONIN I
TROPONIN I: 0.03 ng/mL (ref <0.031)
Troponin I: <0.03 ng/mL (ref <0.031)

## 2016-01-09 LAB — CBC WITH DIFFERENTIAL/PLATELET
BASOS ABS: 0 10*3/uL (ref 0.0–0.1)
Basophils Relative: 0 %
EOS PCT: 3 %
Eosinophils Absolute: 0.3 10*3/uL (ref 0.0–0.7)
HEMATOCRIT: 33 % — AB (ref 36.0–46.0)
HEMOGLOBIN: 10.9 g/dL — AB (ref 12.0–15.0)
LYMPHS ABS: 1.9 10*3/uL (ref 0.7–4.0)
LYMPHS PCT: 24 %
MCH: 32.2 pg (ref 26.0–34.0)
MCHC: 33 g/dL (ref 30.0–36.0)
MCV: 97.6 fL (ref 78.0–100.0)
Monocytes Absolute: 0.6 10*3/uL (ref 0.1–1.0)
Monocytes Relative: 8 %
NEUTROS ABS: 5.2 10*3/uL (ref 1.7–7.7)
NEUTROS PCT: 65 %
Platelets: 238 10*3/uL (ref 150–400)
RBC: 3.38 MIL/uL — AB (ref 3.87–5.11)
RDW: 14 % (ref 11.5–15.5)
WBC: 8 10*3/uL (ref 4.0–10.5)

## 2016-01-09 LAB — D-DIMER, QUANTITATIVE (NOT AT ARMC): D DIMER QUANT: 0.52 ug{FEU}/mL — AB (ref 0.00–0.50)

## 2016-01-09 LAB — ECHOCARDIOGRAM COMPLETE
HEIGHTINCHES: 63 in
Weight: 3384 oz

## 2016-01-09 LAB — HEPARIN LEVEL (UNFRACTIONATED)
HEPARIN UNFRACTIONATED: 0.47 [IU]/mL (ref 0.30–0.70)
Heparin Unfractionated: 0.12 IU/mL — ABNORMAL LOW (ref 0.30–0.70)

## 2016-01-09 LAB — PROCALCITONIN

## 2016-01-09 LAB — MRSA PCR SCREENING: MRSA BY PCR: NEGATIVE

## 2016-01-09 LAB — TSH: TSH: 2.829 u[IU]/mL (ref 0.350–4.500)

## 2016-01-09 MED ORDER — DILTIAZEM HCL 100 MG IV SOLR
5.0000 mg/h | INTRAVENOUS | Status: DC
  Administered 2016-01-09: 5 mg/h via INTRAVENOUS
  Administered 2016-01-09: 7.5 mg/h via INTRAVENOUS
  Filled 2016-01-09 (×3): qty 100

## 2016-01-09 MED ORDER — DILTIAZEM HCL 60 MG PO TABS
60.0000 mg | ORAL_TABLET | Freq: Three times a day (TID) | ORAL | Status: DC
  Administered 2016-01-09 – 2016-01-12 (×10): 60 mg via ORAL
  Filled 2016-01-09 (×10): qty 1

## 2016-01-09 MED ORDER — ONDANSETRON HCL 4 MG/2ML IJ SOLN: 4.0000 mg | Freq: Four times a day (QID) | INTRAMUSCULAR | Status: DC | PRN

## 2016-01-09 MED ORDER — LEVOTHYROXINE SODIUM 100 MCG PO TABS
100.0000 ug | ORAL_TABLET | Freq: Every day | ORAL | Status: DC
  Administered 2016-01-09 – 2016-01-12 (×4): 100 ug via ORAL
  Filled 2016-01-09 (×4): qty 1

## 2016-01-09 MED ORDER — FUROSEMIDE 10 MG/ML IJ SOLN
40.0000 mg | Freq: Two times a day (BID) | INTRAMUSCULAR | Status: DC
  Administered 2016-01-09 – 2016-01-12 (×6): 40 mg via INTRAVENOUS
  Filled 2016-01-09 (×5): qty 4

## 2016-01-09 MED ORDER — HEPARIN BOLUS VIA INFUSION
3000.0000 [IU] | Freq: Once | INTRAVENOUS | Status: AC
  Administered 2016-01-09: 3000 [IU] via INTRAVENOUS
  Filled 2016-01-09: qty 3000

## 2016-01-09 MED ORDER — ACETAMINOPHEN 650 MG RE SUPP: 650.0000 mg | Freq: Four times a day (QID) | RECTAL | Status: DC | PRN

## 2016-01-09 MED ORDER — INSULIN ASPART 100 UNIT/ML ~~LOC~~ SOLN
0.0000 [IU] | Freq: Three times a day (TID) | SUBCUTANEOUS | Status: DC
  Administered 2016-01-11: 2 [IU] via SUBCUTANEOUS

## 2016-01-09 MED ORDER — POTASSIUM CHLORIDE CRYS ER 20 MEQ PO TBCR
40.0000 meq | EXTENDED_RELEASE_TABLET | Freq: Once | ORAL | Status: AC
  Administered 2016-01-09: 40 meq via ORAL
  Filled 2016-01-09: qty 2

## 2016-01-09 MED ORDER — HEPARIN BOLUS VIA INFUSION
2000.0000 [IU] | Freq: Once | INTRAVENOUS | Status: AC
  Administered 2016-01-09: 2000 [IU] via INTRAVENOUS
  Filled 2016-01-09: qty 2000

## 2016-01-09 MED ORDER — SODIUM CHLORIDE 0.9% FLUSH
3.0000 mL | Freq: Two times a day (BID) | INTRAVENOUS | Status: DC
  Administered 2016-01-09 – 2016-01-12 (×5): 3 mL via INTRAVENOUS

## 2016-01-09 MED ORDER — METOPROLOL SUCCINATE ER 25 MG PO TB24
25.0000 mg | ORAL_TABLET | Freq: Every day | ORAL | Status: DC
  Administered 2016-01-09 – 2016-01-12 (×4): 25 mg via ORAL
  Filled 2016-01-09 (×4): qty 1

## 2016-01-09 MED ORDER — INSULIN ASPART 100 UNIT/ML ~~LOC~~ SOLN: 0.0000 [IU] | Freq: Every day | SUBCUTANEOUS | Status: DC

## 2016-01-09 MED ORDER — HEPARIN (PORCINE) IN NACL 100-0.45 UNIT/ML-% IJ SOLN
1350.0000 [IU]/h | INTRAMUSCULAR | Status: DC
  Administered 2016-01-09: 1100 [IU]/h via INTRAVENOUS
  Administered 2016-01-09 – 2016-01-11 (×3): 1350 [IU]/h via INTRAVENOUS
  Filled 2016-01-09 (×3): qty 250

## 2016-01-09 MED ORDER — MOMETASONE FURO-FORMOTEROL FUM 200-5 MCG/ACT IN AERO
2.0000 | INHALATION_SPRAY | Freq: Two times a day (BID) | RESPIRATORY_TRACT | Status: DC
  Administered 2016-01-10 – 2016-01-12 (×4): 2 via RESPIRATORY_TRACT
  Filled 2016-01-09 (×3): qty 8.8

## 2016-01-09 MED ORDER — ONDANSETRON HCL 4 MG PO TABS: 4.0000 mg | ORAL_TABLET | Freq: Four times a day (QID) | ORAL | Status: DC | PRN

## 2016-01-09 MED ORDER — ACETAMINOPHEN 325 MG PO TABS: 650.0000 mg | ORAL_TABLET | Freq: Four times a day (QID) | ORAL | Status: DC | PRN

## 2016-01-09 MED ORDER — IRBESARTAN 150 MG PO TABS
150.0000 mg | ORAL_TABLET | Freq: Every day | ORAL | Status: DC
  Administered 2016-01-09 – 2016-01-12 (×4): 150 mg via ORAL
  Filled 2016-01-09 (×4): qty 1

## 2016-01-09 MED ORDER — AMLODIPINE BESYLATE 5 MG PO TABS: 5.0000 mg | ORAL_TABLET | Freq: Every day | ORAL | Status: DC

## 2016-01-09 NOTE — H&P (Addendum)
History and Physical    [name] Case [id number] DOB: March 21, 1932 DOA: 01/08/2016  PCP: [name] Koch, MD  Outpatient Specialists: Dr.Skains. Cardiologist. Patient coming from: Home.  Chief Complaint: Shortness of breath.  HPI: [name] Case is a 82 y.o. female with medical history significant of paroxysmal atrial fibrillation, hypertension, asthma presents to the ER because of shortness of breath. Patient has been in chronic shortness of breath and is being followed by cardiologist and pulmonologist. But over the last 2 weeks patient has been having increasing shortness of breath on exertion and at times at rest also. Denies any chest pain but did have some palpitations. Denies any cough fever or productive sputum. In the ER patient was found to be in A. fib with RVR and chest x-ray shows congestion. Patient was given Lasix 40 mg IV and started on Cardizem infusion. On my exam patient's blood pressure is in the low normal side around 96 systolic. Heart rate is around 110 bpm in A. fib. Patient is not in distress. Patient states she may have gained 10 pounds over the last 1 year. But states she has been compliant with her Lasix.   ED Course: Lasix 40 mg IV and Cardizem infusion was started.  Review of Systems: As per HPI otherwise 10 point review of systems negative.    Past Medical History  Diagnosis Date  . Allergy   . Asthma   . Hyperlipidemia   . Hypertension   . Thyroid disease   . GERD (gastroesophageal reflux disease)   . CHF (congestive heart failure) ([name] Case)   . Bronchitis   . Atrial fibrillation ([name] Case)   . Diabetes mellitus without complication [facility])     Past Surgical History  Procedure Laterality Date  . Appendectomy    . Cholecystectomy    . Abdominal hysterectomy    . Total knee arthroplasty       reports that she has never smoked. She has never used smokeless tobacco. She reports that she does not drink alcohol or use illicit drugs.  Allergies    Allergen Reactions  . Codeine Hives and Rash    In cough syrup preparations    Family History  Problem Relation Age of Onset  . Cancer Mother     colon  . Heart disease Mother     Prior to Admission medications   Medication Sig Start Date End Date Taking? Authorizing Provider  amLODipine (NORVASC) 5 MG tablet TAKE 1 TABLET (5 MG TOTAL) BY MOUTH DAILY. 08/13/15  Yes [name] Koch, MD  budesonide-formoterol [facility]) 160-4.5 MCG/ACT inhaler Inhale 2 puffs into the lungs 2 (two) times daily. 03/21/15  Yes [name] Koch, MD  furosemide (LASIX) 80 MG tablet Take 0.5 tablets (40 mg total) by mouth daily. 12/17/15  Yes [name] Koch, MD  irbesartan (AVAPRO) 150 MG tablet Take 1 tablet (150 mg total) by mouth daily. 05/28/15  Yes [name] Rockers, MD  levothyroxine (SYNTHROID, LEVOTHROID) 100 MCG tablet Take 1 tablet (100 mcg total) by mouth daily. 12/07/15  Yes [name] Koch, MD  metoprolol succinate (TOPROL-XL) 25 MG 24 hr tablet Take 1 tablet (25 mg total) by mouth daily. 05/04/15  Yes Jerline Pain, MD  metFORMIN (GLUCOPHAGE) 500 MG tablet Take 1 tablet (500 mg total) by mouth daily with breakfast. Patient not taking: Reported on 01/08/2016 09/20/15   [name] Koch, MD  predniSONE (DELTASONE) 20 MG tablet Take 3 tabs po on first day, 2 tabs second day, 2  tabs third day, 1 tab fourth day, 1 tab 5th day. Take with food. Start 10/31/15. Patient not taking: Reported on 01/08/2016 10/30/15   [name] Napoleon, NP    Physical Exam: Filed Vitals:   01/08/16 2345 01/09/16 0000 01/09/16 0101 01/09/16 0200  BP: 108/61 118/70 103/79 94/74  Pulse: 131 143 70 144  Temp:      TempSrc:      Resp: 21 21 19 19   Height:   5\' 3"  (1.6 m)   Weight:   211 lb 8 oz (95.936 kg)   SpO2: 95% 95% 94% 93%      Constitutional: Not in distress. Filed Vitals:   01/08/16 2345 01/09/16 0000 01/09/16 0101 01/09/16 0200  BP: 108/61 118/70 103/79 94/74  Pulse: 131 143 70 144  Temp:      TempSrc:       Resp: 21 21 19 19   Height:   5\' 3"  (1.6 m)   Weight:   211 lb 8 oz (95.936 kg)   SpO2: 95% 95% 94% 93%   Eyes: Anicteric no pallor. ENMT: No discharge from the ears eyes nose or mouth. Neck: No JVD appreciated no mass felt. Respiratory: No rhonchi or crepitation. Cardiovascular: S1 and S2 are tachycardic. Abdomen: Soft nontender bowel sounds present. Musculoskeletal: No edema. Skin: No rash. Neurologic: Alert awake oriented to time place and person. Moves all extremity strength. Psychiatric: Appears normal.   Labs on Admission: I have personally reviewed following labs and imaging studies  CBC:  Recent Labs Lab 01/08/16 2137  WBC 8.7  HGB 11.1*  HCT 35.2*  MCV 96.7  PLT 0000000   Basic Metabolic Panel:  Recent Labs Lab 01/08/16 2137  NA 142  K 3.3*  CL 103  CO2 31  GLUCOSE 123*  BUN 13  CREATININE 0.90  CALCIUM 8.9   GFR: Estimated Creatinine Clearance: 53.1 mL/min (by C-G formula based on Cr of 0.9). Liver Function Tests: No results for input(s): AST, ALT, ALKPHOS, BILITOT, PROT, ALBUMIN in the last 168 hours. No results for input(s): LIPASE, AMYLASE in the last 168 hours. No results for input(s): AMMONIA in the last 168 hours. Coagulation Profile: No results for input(s): INR, PROTIME in the last 168 hours. Cardiac Enzymes: No results for input(s): CKTOTAL, CKMB, CKMBINDEX, TROPONINI in the last 168 hours. BNP (last 3 results) No results for input(s): PROBNP in the last 8760 hours. HbA1C: No results for input(s): HGBA1C in the last 72 hours. CBG: No results for input(s): GLUCAP in the last 168 hours. Lipid Profile: No results for input(s): CHOL, HDL, LDLCALC, TRIG, CHOLHDL, LDLDIRECT in the last 72 hours. Thyroid Function Tests: No results for input(s): TSH, T4TOTAL, FREET4, T3FREE, THYROIDAB in the last 72 hours. Anemia Panel: No results for input(s): VITAMINB12, FOLATE, FERRITIN, TIBC, IRON, RETICCTPCT in the last 72 hours. Urine analysis:     Component Value Date/Time   COLORURINE YELLOW 01/20/2015 [place] 01/20/2015 1153   LABSPEC 1.008 01/20/2015 1153   PHURINE 6.0 01/20/2015 1153   GLUCOSEU NEGATIVE 01/20/2015 1153   HGBUR LARGE* 01/20/2015 1153   BILIRUBINUR NEGATIVE 01/20/2015 1153   BILIRUBINUR n 07/01/2012 1115   KETONESUR NEGATIVE 01/20/2015 1153   PROTEINUR NEGATIVE 01/20/2015 1153   PROTEINUR n 07/01/2012 1115   UROBILINOGEN 0.2 01/20/2015 1153   UROBILINOGEN 0.2 07/01/2012 1115   NITRITE NEGATIVE 01/20/2015 1153   NITRITE n 07/01/2012 1115   LEUKOCYTESUR MODERATE* 01/20/2015 1153   Sepsis Labs: @LABRCNTIP (procalcitonin:4,lacticidven:4) )No results found for this or any  previous visit (from the past 240 hour(s)).   Radiological Exams on Admission: Dg Chest 2 View  01/08/2016  CLINICAL DATA:  Right sided chest pain and tachycardia beginning tonight. Congestive heart failure. Atrial fibrillation. EXAM: CHEST  2 VIEW COMPARISON:  01/20/2015 FINDINGS: Mild cardiomegaly remains stable. Increased mixed interstitial and airspace disease is seen involving the left lung greater than right. This may be due to asymmetric edema or less likely infection. No evidence of pleural effusion. IMPRESSION: New asymmetric interstitial and airspace disease, left side greater than right. This is most likely due to asymmetric edema, with infection considered less likely. Stable cardiomegaly. Electronically Signed   By: Earle Gell M.D.   On: 01/08/2016 21:59    EKG: Independently reviewed. A. fib with RVR.  Assessment/Plan Principal Problem:   Acute on chronic congestive heart failure (HCC) Active Problems:   Hypothyroidism   Hyperlipidemia   Asthma   Atrial fibrillation with RVR ([place])    #1. Acute respiratory failure probably from decompensated CHF likely diastolic last EF measured was 50-55% in May 2016 - I think at this time patient's CHF is probably excessive. By A. fib with RVR. Patient did receive Lasix 40  mg IV in the ER but at this time patient's blood pressure is in the low normal. Further doses of Lasix will be considered based on patient's blood pressure trends. Cycle cardiac markers check TSH check d-dimer and check 2-D echo. #2. A. fib with RVR - probably precipitating #1. Patient on Cardizem infusion with patient's blood pressure in the low normal at this time. May need to change to amiodarone if pressure does not get better. Chads 2 vasc score is 4 and at this time I have placed patient on heparin infusion. Cycle cardiac markers check TSH and 2-D echo. #3. Hypothyroidism on Synthroid check TSH. #4. Hypertension - holding off Norvasc due to patient being on Cardizem infusion. May have to hold off Avapro and metoprolol if blood pressure is in the low normals. #5. Mild anemia follow CBC. #6. History of asthma presently not wheezing continue Symbicort.  Since chest x-ray was read with possibility of infiltrates I have ordered procalcitonin levels and for now I have not placed patient on antibiotics but may consider if procalcitonin is elevated.   DVT prophylaxis: Heparin infusion. Code Status: Full code.  Family Communication: No family at the bedside.  Disposition Plan: Home.  Consults called: None.  Admission status: Inpatient. Stepdown. Likely stay 2-3 days.    Rise Patience MD [facility] Pager [phone].  If 7PM-7AM, please contact night-coverage [email or web address] Password [email or web address]  01/09/2016, 3:29 AM

## 2016-01-09 NOTE — Progress Notes (Signed)
ANTICOAGULATION CONSULT NOTE - Follow Up Consult  Pharmacy Consult for heparin Indication: atrial fibrillation  Allergies  Allergen Reactions  . Codeine Hives and Rash    In cough syrup preparations    Patient Measurements: Height: 5\' 3"  (160 cm) Weight: 211 lb 8 oz (95.936 kg) IBW/kg (Calculated) : 52.4 Heparin Dosing Weight: 75 kg  Vital Signs: Temp: 98.6 F (37 C) (05/10 1944) Temp Source: Oral (05/10 1944) BP: 112/51 mmHg (05/10 1945) Pulse Rate: 84 (05/10 1945)  Labs:  Recent Labs  01/08/16 2137 01/09/16 0344 01/09/16 0634 01/09/16 1102 01/09/16 1540 01/09/16 2050  HGB 11.1* 10.9*  --   --   --   --   HCT 35.2* 33.0*  --   --   --   --   PLT 246 238  --   --   --   --   HEPARINUNFRC  --   --   --  0.12*  --  0.47  CREATININE 0.90 0.86  --   --   --   --   TROPONINI  --  0.03 <0.03  --  <0.03  --     Estimated Creatinine Clearance: 55.6 mL/min (by C-G formula based on Cr of 0.86).  Assessment:  82yo female c/o worsening SOB over past few days as well as CP, found in ED to be in Afib w/ RVR, to begin heparin. Pt had been on Eliquis in past for PAF though per notes Dr Marlou Porch took her off in late 3026 2/2 low evidence of active Afib. -HL 0.12, CBC stable.   Repeat HL is now therapeutic at 0.47 on heparin 1350 units/hr. No issues with infusion or bleeding noted.  Goal of Therapy:  Heparin level 0.3-0.7 units/ml Monitor platelets by anticoagulation protocol: Yes   Plan:  Continue heparin 1350 units/hr Daily HL, CBC Monitor for S&S of bleed  Andrey Cota. Diona Foley, PharmD, [place] Clinical Pharmacist Pager [phone]  01/09/2016,9:35 PM

## 2016-01-09 NOTE — Progress Notes (Signed)
ANTICOAGULATION CONSULT NOTE - Initial Consult  Pharmacy Consult for heparin Indication: atrial fibrillation  Allergies  Allergen Reactions  . Codeine Hives and Rash    In cough syrup preparations    Patient Measurements: Height: 5\' 3"  (160 cm) Weight: 211 lb 8 oz (95.936 kg) IBW/kg (Calculated) : 52.4 Heparin Dosing Weight: 75kg  Vital Signs: Temp: 98.4 F (36.9 C) (05/09 2132) Temp Source: Oral (05/09 2132) BP: 94/74 mmHg (05/10 0200) Pulse Rate: 144 (05/10 0200)  Labs:  Recent Labs  01/08/16 2137  HGB 11.1*  HCT 35.2*  PLT 246  CREATININE 0.90    Estimated Creatinine Clearance: 53.1 mL/min (by C-G formula based on Cr of 0.9).   Medical History: Past Medical History  Diagnosis Date  . Allergy   . Asthma   . Hyperlipidemia   . Hypertension   . Thyroid disease   . GERD (gastroesophageal reflux disease)   . CHF (congestive heart failure) ([place])   . Bronchitis   . Atrial fibrillation ([place])   . Diabetes mellitus without complication ([place])     Medications:  Prescriptions prior to admission  Medication Sig Dispense Refill Last Dose  . amLODipine (NORVASC) 5 MG tablet TAKE 1 TABLET (5 MG TOTAL) BY MOUTH DAILY. 30 tablet 5 01/08/2016 at Unknown time  . budesonide-formoterol (SYMBICORT) 160-4.5 MCG/ACT inhaler Inhale 2 puffs into the lungs 2 (two) times daily. 1 Inhaler 3 Past Month at Unknown time  . furosemide (LASIX) 80 MG tablet Take 0.5 tablets (40 mg total) by mouth daily. 30 tablet 3 01/08/2016 at Unknown time  . irbesartan (AVAPRO) 150 MG tablet Take 1 tablet (150 mg total) by mouth daily. 30 tablet 11 01/08/2016 at Unknown time  . levothyroxine (SYNTHROID, LEVOTHROID) 100 MCG tablet Take 1 tablet (100 mcg total) by mouth daily. 90 tablet 3 01/08/2016 at Unknown time  . metoprolol succinate (TOPROL-XL) 25 MG 24 hr tablet Take 1 tablet (25 mg total) by mouth daily. 30 tablet 11 01/08/2016 at 0730  . metFORMIN (GLUCOPHAGE) 500 MG tablet Take 1 tablet (500 mg total) by  mouth daily with breakfast. (Patient not taking: Reported on 01/08/2016) 90 tablet 3 Not Taking at Unknown time  . predniSONE (DELTASONE) 20 MG tablet Take 3 tabs po on first day, 2 tabs second day, 2 tabs third day, 1 tab fourth day, 1 tab 5th day. Take with food. Start 10/31/15. (Patient not taking: Reported on 01/08/2016) 9 tablet 0 Completed Course at Unknown time   Infusions:  . diltiazem (CARDIZEM) infusion 7.5 mg/hr (01/09/16 0158)    Assessment: 82yo female c/o worsening SOB over past few days as well as CP, found in ED to be in Afib w/ RVR, to begin heparin.  Pt had been on Eliquis in past for PAF though per notes Dr Marlou Porch took her off in late 3026 2/2 low evidence of active Afib.  Goal of Therapy:  Heparin level 0.3-0.7 units/ml Monitor platelets by anticoagulation protocol: Yes   Plan:  Will give heparin 3000 units IV bolus x1 followed by gtt at 1100 units/hr and monitor heparin levels and CBC; f/u long-term anticoag if appropriate.    Wynona Neat, PharmD, BCPS  01/09/2016,2:31 AM

## 2016-01-09 NOTE — ED Notes (Signed)
Patient in restroom.

## 2016-01-09 NOTE — Progress Notes (Signed)
Echocardiogram 2D Echocardiogram has been performed.  [name] Case 01/09/2016, 10:12 AM

## 2016-01-09 NOTE — Progress Notes (Addendum)
[place] TEAM 1 - Stepdown/ICU TEAM PROGRESS NOTE  [name] Case [id number] DOB: Jan 01, 1932 DOA: 01/08/2016 PCP: Hoyt Koch, MD  Admit HPI / Brief Narrative: 82 y.o. female with history significant for paroxysmal atrial fibrillation, HTN, and asthma who presented to the ER w/ shortness of breath. Patient has chronic shortness of breath and is being followed by Cardiology and Pulmonology. Over 2 weeks the patient has been having increasing shortness of breath on exertion and at rest. Denied chest pain but did have some palpitations.   In the ER patient was found to be in A. fib with RVR and chest x-ray suggested congestion. Patient was given Lasix 40 mg IV and started on Cardizem infusion.   HPI/Subjective: Pt seen for f/u visit.  Assessment/Plan:   Acute respiratory failure - decompensated CHF EF 50-55% in May 2016 Filed Weights   01/09/16 0101  Weight: 95.936 kg (211 lb 8 oz)    Parox A. fib with RVR CHA2DS2 VASc score is 4 - on heparin infusion presently - was previosly on warfarin, which was changed to Eliquis then stopped altogether as pt "was no longer in afib" per her report  Hypothyroidism on Synthroid - TSH normal   Hypokalemia tx and follow  Hypertension  Mild anemia follow CBC  History of asthma presently not wheezing continue Symbicort  DM2  Code Status: FULL Family Communication: no family present at time of exam Disposition Plan: SDU on cardizem gtt   Consultants: none  Procedures: none  Antibiotics: none  DVT prophylaxis: IV heparin   Objective: Blood pressure 108/52, pulse 89, temperature 99.1 F (37.3 C), temperature source Oral, resp. rate 20, height 5\' 3"  (1.6 m), weight 95.936 kg (211 lb 8 oz), SpO2 97 %.  Intake/Output Summary (Last 24 hours) at 01/09/16 1155 Last data filed at 01/09/16 1100  Gross per 24 hour  Intake 302.49 ml  Output    965 ml  Net -662.51 ml   Exam: Pt seen for f/u visit.  Data  Reviewed: Basic Metabolic Panel:  Recent Labs Lab 01/08/16 2137 01/09/16 0344  NA 142 144  K 3.3* 3.3*  CL 103 104  CO2 31 28  GLUCOSE 123* 161*  BUN 13 11  CREATININE 0.90 0.86  CALCIUM 8.9 8.7*    CBC:  Recent Labs Lab 01/08/16 2137 01/09/16 0344  WBC 8.7 8.0  NEUTROABS  --  5.2  HGB 11.1* 10.9*  HCT 35.2* 33.0*  MCV 96.7 97.6  PLT 246 238    Liver Function Tests:  Recent Labs Lab 01/09/16 0344  AST 20  ALT 21  ALKPHOS 50  BILITOT 0.5  PROT 6.0*  ALBUMIN 3.1*   No results for input(s): LIPASE, AMYLASE in the last 168 hours. No results for input(s): AMMONIA in the last 168 hours.  Coags: No results for input(s): INR in the last 168 hours.  Invalid input(s): PT No results for input(s): APTT in the last 168 hours.  Cardiac Enzymes:  Recent Labs Lab 01/09/16 0344 01/09/16 0634  TROPONINI 0.03 <0.03    CBG:  Recent Labs Lab 01/09/16 0821  GLUCAP 120*    Recent Results (from the past 240 hour(s))  MRSA PCR Screening     Status: None   Collection Time: 01/09/16  2:02 AM  Result Value Ref Range Status   MRSA by PCR NEGATIVE NEGATIVE Final    Comment:        The GeneXpert MRSA Assay (FDA approved for NASAL specimens only), is one component  of a comprehensive MRSA colonization surveillance program. It is not intended to diagnose MRSA infection nor to guide or monitor treatment for MRSA infections.      Studies:   Recent x-ray studies have been reviewed in detail by the Attending Physician  Scheduled Meds:  Scheduled Meds: . irbesartan  150 mg Oral Daily  . levothyroxine  100 mcg Oral QAC breakfast  . metoprolol succinate  25 mg Oral Daily  . mometasone-formoterol  2 puff Inhalation BID  . sodium chloride flush  3 mL Intravenous Q12H    Time spent on care of this patient: No charge   Cherene Altes , MD   [facility] Office  [phone] Pager - Text Page per Amion as per below:  On-Call/Text Page:       [email or web address]      password [email or web address]  If 7PM-7AM, please contact night-coverage [email or web address] Password [email or web address] 01/09/2016, 11:55 AM   LOS: 0 days

## 2016-01-09 NOTE — Progress Notes (Signed)
ANTICOAGULATION CONSULT NOTE - Follow Up Consult  Pharmacy Consult for heparin Indication: atrial fibrillation  Allergies  Allergen Reactions  . Codeine Hives and Rash    In cough syrup preparations    Patient Measurements: Height: 5\' 3"  (160 cm) Weight: 211 lb 8 oz (95.936 kg) IBW/kg (Calculated) : 52.4 Heparin Dosing Weight: 75 kg  Vital Signs: Temp: 99.1 F (37.3 C) (05/10 1143) Temp Source: Oral (05/10 1143) BP: 107/92 mmHg (05/10 1200) Pulse Rate: 81 (05/10 1200)  Labs:  Recent Labs  01/08/16 2137 01/09/16 0344 01/09/16 0634 01/09/16 1102  HGB 11.1* 10.9*  --   --   HCT 35.2* 33.0*  --   --   PLT 246 238  --   --   HEPARINUNFRC  --   --   --  0.12*  CREATININE 0.90 0.86  --   --   TROPONINI  --  0.03 <0.03  --     Estimated Creatinine Clearance: 55.6 mL/min (by C-G formula based on Cr of 0.86).  Assessment:  82yo female c/o worsening SOB over past few days as well as CP, found in ED to be in Afib w/ RVR, to begin heparin. Pt had been on Eliquis in past for PAF though per notes Dr Marlou Porch took her off in late 3026 2/2 low evidence of active Afib. -HL 0.12, CBC stable.   Goal of Therapy:  Heparin level 0.3-0.7 units/ml Monitor platelets by anticoagulation protocol: Yes   Plan:  Heparin IV 2000u bolus, then inc heparin gtt 1350 units/hr 8 hr HL Daily HL, CBC Monitor for S&S of bleed  Angela Burke, PharmD Pharmacy Resident Pager: [phone] 01/09/2016,12:33 PM

## 2016-01-10 DIAGNOSIS — E876 Hypokalemia: Secondary | ICD-10-CM | POA: Diagnosis present

## 2016-01-10 DIAGNOSIS — E662 Morbid (severe) obesity with alveolar hypoventilation: Secondary | ICD-10-CM

## 2016-01-10 DIAGNOSIS — J4520 Mild intermittent asthma, uncomplicated: Secondary | ICD-10-CM

## 2016-01-10 DIAGNOSIS — E785 Hyperlipidemia, unspecified: Secondary | ICD-10-CM

## 2016-01-10 DIAGNOSIS — J9601 Acute respiratory failure with hypoxia: Secondary | ICD-10-CM

## 2016-01-10 DIAGNOSIS — E038 Other specified hypothyroidism: Secondary | ICD-10-CM

## 2016-01-10 LAB — BASIC METABOLIC PANEL
Anion gap: 9 (ref 5–15)
BUN: 12 mg/dL (ref 6–20)
CALCIUM: 8.8 mg/dL — AB (ref 8.9–10.3)
CO2: 30 mmol/L (ref 22–32)
Chloride: 103 mmol/L (ref 101–111)
Creatinine, Ser: 0.83 mg/dL (ref 0.44–1.00)
GFR calc non Af Amer: >60 mL/min (ref >60)
GLUCOSE: 120 mg/dL — AB (ref 65–99)
POTASSIUM: 3.8 mmol/L (ref 3.5–5.1)
Sodium: 142 mmol/L (ref 135–145)

## 2016-01-10 LAB — GLUCOSE, CAPILLARY
GLUCOSE-CAPILLARY: 111 mg/dL — AB (ref 65–99)
GLUCOSE-CAPILLARY: 115 mg/dL — AB (ref 65–99)
GLUCOSE-CAPILLARY: 110 mg/dL — AB (ref 65–99)
Glucose-Capillary: 116 mg/dL — ABNORMAL HIGH (ref 65–99)

## 2016-01-10 LAB — CBC
HEMATOCRIT: 33.2 % — AB (ref 36.0–46.0)
HEMOGLOBIN: 10.3 g/dL — AB (ref 12.0–15.0)
MCH: 29.9 pg (ref 26.0–34.0)
MCHC: 31 g/dL (ref 30.0–36.0)
MCV: 96.5 fL (ref 78.0–100.0)
Platelets: 243 10*3/uL (ref 150–400)
RBC: 3.44 MIL/uL — AB (ref 3.87–5.11)
RDW: 14.2 % (ref 11.5–15.5)
WBC: 6.7 10*3/uL (ref 4.0–10.5)

## 2016-01-10 LAB — HEPARIN LEVEL (UNFRACTIONATED): HEPARIN UNFRACTIONATED: 0.39 [IU]/mL (ref 0.30–0.70)

## 2016-01-10 LAB — HEMOGLOBIN A1C
HEMOGLOBIN A1C: 6.4 % — AB (ref 4.8–5.6)
MEAN PLASMA GLUCOSE: 137 mg/dL

## 2016-01-10 LAB — MAGNESIUM: Magnesium: 1.9 mg/dL (ref 1.7–2.4)

## 2016-01-10 MED ORDER — CETYLPYRIDINIUM CHLORIDE 0.05 % MT LIQD
7.0000 mL | Freq: Two times a day (BID) | OROMUCOSAL | Status: DC
  Administered 2016-01-10 – 2016-01-12 (×5): 7 mL via OROMUCOSAL

## 2016-01-10 MED ORDER — ATORVASTATIN CALCIUM 20 MG PO TABS
20.0000 mg | ORAL_TABLET | Freq: Every day | ORAL | Status: DC
Start: 2016-01-11 — End: 2016-01-12
  Administered 2016-01-11: 20 mg via ORAL
  Filled 2016-01-10: qty 1

## 2016-01-10 NOTE — Progress Notes (Signed)
PROGRESS NOTE    [name] Case  [id number] DOB: 14-Dec-1931 DOA: 01/08/2016 PCP: Hoyt Koch, MD    Brief Narrative:  82 y.o. WF PMHx Paroxysmal Atrial Fibrillation was on Eliquis until stopped 05/04/2015 by Dr. Candee Furbish cardiology (see cardiology note), HTN, Asthma, Obesity Hypoventilation syndrome (per Dr. Christinia Gully PC CM 05/28/2015 no indication for a time 02)   Presents to the ER because of shortness of breath. Patient has been in chronic shortness of breath and is being followed by cardiologist and pulmonologist. But over the last 2 weeks patient has been having increasing shortness of breath on exertion and at times at rest also. Denies any chest pain but did have some palpitations. Denies any cough fever or productive sputum. In the ER patient was found to be in A. fib with RVR and chest x-ray shows congestion. Patient was given Lasix 40 mg IV and started on Cardizem infusion. On my exam patient's blood pressure is in the low normal side around 96 systolic. Heart rate is around 110 bpm in A. fib. Patient is not in distress. Patient states she may have gained 10 pounds over the last 1 year. But states she has been compliant with her Lasix.   ED Course: Lasix 40 mg IV and Cardizem infusion was started.   Assessment & Plan:   Principal Problem:   Acute on chronic congestive heart failure (HCC) Active Problems:   Hypothyroidism   Hyperlipidemia   Asthma   Atrial fibrillation with RVR (HCC)   Acute respiratory failure with hypoxia (HCC)   Obesity hypoventilation syndrome (HCC)   Hypokalemia  Acute respiratory failure - Decompensated CHF? -EF 50-55% in May 2016 -Strict in and out since admission -1.6 L -Daily weight  Filed Weights   01/09/16 0101 01/10/16 0300  Weight: 95.936 kg (211 lb 8 oz) 95.8 kg (211 lb 3.2 oz)    Parox A. fib with RVR(CHA2DS2 VASc score is 4)  - on Heparin infusion presently  - was previosly on warfarin, and Eliquis then stopped  altogether as pt "was no longer in afib" per her report and cardiology. -Currently patient NSR with frequent PVC -Cardizem 60 mg TID -Toprol 25 mg daily -In the Am As patient is rate controlled, and per cardiology note do not believe truly has A. fib would discussed pros/cons starting on full dose aspirin and DC heparin. Granddaughter would like to be included in discussion as she cares for patient  Obesity hypoventilation syndrome -Ambulatory SPO2 on room air pending  History of asthma -presently not wheezing continue Symbicort  Hypothyroidism - TSH normal  -Synthroid 100 g daily  Hypokalemia -Potassium goal> 4  Hypertension -Controlled  Mild anemia follow CBC  DM Type 2 controlled with, complications -0000000 hemoglobin A1c= 6.4  HLD -Lipid panel Not within ADA guideline -Start Lipitor 20 mg daily    DVT prophylaxis:   Heparin drip  Code Status:  Full  Family Communication:  Spoke with granddaughter on phone  Disposition Plan:  DC in next 24-48 hours    Consultants:  NA  Procedures/Significant Events:  5/10 echocardiogram- LVEF= 50%- 55%.    Cultures NA  Antimicrobials: NA  Devices NA   LINES / TUBES:  NA    Continuous Infusions: . heparin 1,350 Units/hr (01/10/16 1704)     Subjective: 5/11  A/O 4 patient/granddaughter state patient has increasing DOE at home. Believes needs home O2 but was stopped by [facility]. States unknown baseline weight. Does not weigh yourself daily. Cardiology unsure  patient has true paroxysmal atrial fibrillation, appears was in the process of stopping anticoagulation. Per granddaughter also patient had hematuria while on Eliquis which was another reason for DC in anticoagulation.    Objective: Filed Vitals:   01/10/16 0804 01/10/16 0942 01/10/16 1206 01/10/16 1741  BP: 122/66  109/58 100/50  Pulse: 76  79 81  Temp: 97.4 F (36.3 C)  97.8 F (36.6 C) 98.1 F (36.7 C)  TempSrc: Oral  Oral Oral  Resp: 17  18 16     Height:      Weight:      SpO2: 96% 99% 96% 95%    Intake/Output Summary (Last 24 hours) at 01/10/16 1927 Last data filed at 01/10/16 1900  Gross per 24 hour  Intake    937 ml  Output   2000 ml  Net  -1063 ml   Filed Weights   01/09/16 0101 01/10/16 0300  Weight: 95.936 kg (211 lb 8 oz) 95.8 kg (211 lb 3.2 oz)    Examination:  General: A/O 4, NAD,No acute respiratory distress Eyes: negative scleral hemorrhage, negative anisocoria, negative icterus ENT: Negative Runny nose, negative gingival bleeding, Neck:  Negative scars, masses, torticollis, lymphadenopathy, JVD Lungs: Clear to auscultation bilaterally without wheezes or crackles Cardiovascular: Regular rate and rhythm without murmur gallop or rub normal S1 and S2 Abdomen: negative abdominal pain, nondistended, positive soft, bowel sounds, no rebound, no ascites, no appreciable mass Extremities: No significant cyanosis, clubbing, or edema bilateral lower extremities Skin: Negative rashes, lesions, ulcers Psychiatric:  Negative depression, negative anxiety, negative fatigue, negative mania  Central nervous system:  Cranial nerves II through XII intact, tongue/uvula midline, all extremities muscle strength 5/5, sensation intact throughout, negative dysarthria, negative expressive aphasia, negative receptive aphasia..     Data Reviewed: Care during the described time interval was provided by me .  I have reviewed this patient's available data, including medical history, events of note, physical examination, and all test results as part of my evaluation. I have personally reviewed and interpreted all radiology studies.  CBC:  Recent Labs Lab 01/08/16 2137 01/09/16 0344 01/10/16 0307  WBC 8.7 8.0 6.7  NEUTROABS  --  5.2  --   HGB 11.1* 10.9* 10.3*  HCT 35.2* 33.0* 33.2*  MCV 96.7 97.6 96.5  PLT 246 238 0000000   Basic Metabolic Panel:  Recent Labs Lab 01/08/16 2137 01/09/16 0344 01/10/16 0307  NA 142 144 142  K  3.3* 3.3* 3.8  CL 103 104 103  CO2 31 28 30   GLUCOSE 123* 161* 120*  BUN 13 11 12   CREATININE 0.90 0.86 0.83  CALCIUM 8.9 8.7* 8.8*  MG  --   --  1.9   GFR: Estimated Creatinine Clearance: 57.6 mL/min (by C-G formula based on Cr of 0.83). Liver Function Tests:  Recent Labs Lab 01/09/16 0344  AST 20  ALT 21  ALKPHOS 50  BILITOT 0.5  PROT 6.0*  ALBUMIN 3.1*   No results for input(s): LIPASE, AMYLASE in the last 168 hours. No results for input(s): AMMONIA in the last 168 hours. Coagulation Profile: No results for input(s): INR, PROTIME in the last 168 hours. Cardiac Enzymes:  Recent Labs Lab 01/09/16 0344 01/09/16 0634 01/09/16 1540  TROPONINI 0.03 <0.03 <0.03   BNP (last 3 results) No results for input(s): PROBNP in the last 8760 hours. HbA1C:  Recent Labs  01/09/16 0634  HGBA1C 6.4*   CBG:  Recent Labs Lab 01/09/16 1627 01/09/16 2135 01/10/16 0808 01/10/16 1209 01/10/16 1744  GLUCAP 108* 124* 111* 115* 110*   Lipid Profile: No results for input(s): CHOL, HDL, LDLCALC, TRIG, CHOLHDL, LDLDIRECT in the last 72 hours. Thyroid Function Tests:  Recent Labs  01/09/16 0344  TSH 2.829   Anemia Panel: No results for input(s): VITAMINB12, FOLATE, FERRITIN, TIBC, IRON, RETICCTPCT in the last 72 hours. Urine analysis:    Component Value Date/Time   COLORURINE YELLOW 01/20/2015 [place] 01/20/2015 1153   LABSPEC 1.008 01/20/2015 1153   PHURINE 6.0 01/20/2015 1153   GLUCOSEU NEGATIVE 01/20/2015 1153   HGBUR LARGE* 01/20/2015 1153   BILIRUBINUR NEGATIVE 01/20/2015 1153   BILIRUBINUR n 07/01/2012 1115   KETONESUR NEGATIVE 01/20/2015 1153   PROTEINUR NEGATIVE 01/20/2015 1153   PROTEINUR n 07/01/2012 1115   UROBILINOGEN 0.2 01/20/2015 1153   UROBILINOGEN 0.2 07/01/2012 1115   NITRITE NEGATIVE 01/20/2015 1153   NITRITE n 07/01/2012 1115   LEUKOCYTESUR MODERATE* 01/20/2015 1153   Sepsis  Labs: @LABRCNTIP (procalcitonin:4,lacticidven:4)  ) Recent Results (from the past 240 hour(s))  MRSA PCR Screening     Status: None   Collection Time: 01/09/16  2:02 AM  Result Value Ref Range Status   MRSA by PCR NEGATIVE NEGATIVE Final    Comment:        The GeneXpert MRSA Assay (FDA approved for NASAL specimens only), is one component of a comprehensive MRSA colonization surveillance program. It is not intended to diagnose MRSA infection nor to guide or monitor treatment for MRSA infections.          Radiology Studies: Dg Chest 2 View  01/08/2016  CLINICAL DATA:  Right sided chest pain and tachycardia beginning tonight. Congestive heart failure. Atrial fibrillation. EXAM: CHEST  2 VIEW COMPARISON:  01/20/2015 FINDINGS: Mild cardiomegaly remains stable. Increased mixed interstitial and airspace disease is seen involving the left lung greater than right. This may be due to asymmetric edema or less likely infection. No evidence of pleural effusion. IMPRESSION: New asymmetric interstitial and airspace disease, left side greater than right. This is most likely due to asymmetric edema, with infection considered less likely. Stable cardiomegaly. Electronically Signed   By: Earle Gell M.D.   On: 01/08/2016 21:59        Scheduled Meds: . antiseptic oral rinse  7 mL Mouth Rinse BID  . [START ON 01/11/2016] atorvastatin  20 mg Oral q1800  . diltiazem  60 mg Oral Q8H  . furosemide  40 mg Intravenous Q12H  . insulin aspart  0-5 Units Subcutaneous QHS  . insulin aspart  0-9 Units Subcutaneous TID WC  . irbesartan  150 mg Oral Daily  . levothyroxine  100 mcg Oral QAC breakfast  . metoprolol succinate  25 mg Oral Daily  . mometasone-formoterol  2 puff Inhalation BID  . sodium chloride flush  3 mL Intravenous Q12H   Continuous Infusions: . heparin 1,350 Units/hr (01/10/16 1704)     LOS: 1 day    Time spent: 40 minutes    Denelle Capurro, Geraldo Docker, MD [facility] Pager  [phone]   If 7PM-7AM, please contact night-coverage [email or web address] Password [facility] 01/10/2016, 7:27 PM

## 2016-01-10 NOTE — Progress Notes (Signed)
ANTICOAGULATION CONSULT NOTE - Follow Up Consult  Pharmacy Consult for heparin Indication: atrial fibrillation  Allergies  Allergen Reactions  . Codeine Hives and Rash    In cough syrup preparations    Patient Measurements: Height: 5\' 3"  (160 cm) Weight: 211 lb 3.2 oz (95.8 kg) IBW/kg (Calculated) : 52.4 Heparin Dosing Weight: 75 kg  Vital Signs: Temp: 97.7 F (36.5 C) (05/11 0500) Temp Source: Oral (05/11 0500) BP: 114/63 mmHg (05/11 0500) Pulse Rate: 95 (05/11 0500)  Labs:  Recent Labs  01/08/16 2137 01/09/16 0344 01/09/16 0634 01/09/16 1102 01/09/16 1540 01/09/16 2050 01/10/16 0307  HGB 11.1* 10.9*  --   --   --   --  10.3*  HCT 35.2* 33.0*  --   --   --   --  33.2*  PLT 246 238  --   --   --   --  243  HEPARINUNFRC  --   --   --  0.12*  --  0.47 0.39  CREATININE 0.90 0.86  --   --   --   --  0.83  TROPONINI  --  0.03 <0.03  --  <0.03  --   --     Estimated Creatinine Clearance: 57.6 mL/min (by C-G formula based on Cr of 0.83).  Assessment:  82yo female c/o worsening SOB over past few days as well as CP, found in ED to be in Afib w/ RVR, to begin heparin. Pt had been on Eliquis in past for PAF though per notes Dr Marlou Porch took her off in late 3026 2/2 low evidence of active Afib. HL therapeutic x 2. CBC stable.   Goal of Therapy:  Heparin level 0.3-0.7 units/ml Monitor platelets by anticoagulation protocol: Yes   Plan:  Continue heparin gtt at 1350 units/hr Daily HL, CBC Monitor for S&S of bleed  Angela Burke, PharmD Pharmacy Resident Pager: [phone] 01/10/2016,7:45 AM

## 2016-01-10 NOTE — Evaluation (Signed)
Physical Therapy Evaluation and Discharge Patient Details Name: [name] Case MRN: [id number] DOB: [birth year] Today's Date: 01/10/2016   History of Present Illness  82 y.o. female with medical history significant of paroxysmal atrial fibrillation, hypertension, asthma presents to the ER because of shortness of breath. Patient has been in chronic shortness of breath and is being followed by cardiologist and pulmonologist. But over the last 2 weeks patient has been having increasing shortness of breath on exertion and at times at rest also. Denies any chest pain but did have some palpitations. Denies any cough fever or productive sputum. In the ER patient was found to be in A. fib with RVR     Clinical Impression  Patient evaluated by Physical Therapy with no further PT needs identified. Assessed patient's SaO2 on room air with ambulation x 180 ft and remained 94% or greater throughout. At rest in chair afterwards varying 91-100% (good waveforms). Patient has no further questions.  PT is signing off. Thank you for this referral.     Follow Up Recommendations No PT follow up    Equipment Recommendations  None recommended by PT    Recommendations for Other Services       Precautions / Restrictions Precautions Precautions: Other (comment) Precaution Comments: watch HR and SaO2      Mobility  Bed Mobility Overal bed mobility: Modified Independent                Transfers Overall transfer level: Independent Equipment used: None                Ambulation/Gait Ambulation/Gait assistance: Min guard Ambulation Distance (Feet): 180 Feet Assistive device: None Gait Pattern/deviations: Step-through pattern;Decreased stride length;Drifts right/left   Gait velocity interpretation: Below normal speed for age/gender General Gait Details: became more steady as she progressed; slowed velocity when turning her head while walking  Stairs            Wheelchair Mobility     Modified Rankin (Stroke Patients Only)       Balance Overall balance assessment: Needs assistance Sitting-balance support: No upper extremity supported;Feet supported Sitting balance-Leahy Scale: Good     Standing balance support: No upper extremity supported Standing balance-Leahy Scale: Fair                               Pertinent Vitals/Pain Pain Assessment: No/denies pain    Home Living Family/patient expects to be discharged to:: Private residence Living Arrangements: Alone Available Help at Discharge: Friend(s);Available PRN/intermittently           Home Equipment: Kasandra Knudsen - single point      Prior Function Level of Independence: Independent         Comments: rarely uses her cane if hip OA is really bad     Hand Dominance        Extremity/Trunk Assessment   Upper Extremity Assessment: Overall WFL for tasks assessed           Lower Extremity Assessment: Generalized weakness      Cervical / Trunk Assessment: Other exceptions  Communication   Communication: No difficulties  Cognition Arousal/Alertness: Awake/alert Behavior During Therapy: Agitated (upset MD ordered PT, but agreeable to assessment) Overall Cognitive Status: Within Functional Limits for tasks assessed                      General Comments      Exercises  Assessment/Plan    PT Assessment Patent does not need any further PT services  PT Diagnosis Difficulty walking   PT Problem List    PT Treatment Interventions     PT Goals (Current goals can be found in the Care Plan section) Acute Rehab PT Goals Patient Stated Goal: go home PT Goal Formulation: All assessment and education complete, DC therapy    Frequency     Barriers to discharge        Co-evaluation               End of Session   Activity Tolerance: Patient tolerated treatment well Patient left: in chair;with call bell/phone within reach Nurse Communication: Mobility  status;Other (comment) (SaO2 on room air)         Time: 1415-1436 PT Time Calculation (min) (ACUTE ONLY): 21 min   Charges:   PT Evaluation $PT Eval Low Complexity: 1 Procedure     PT G Codes:        Sunni Richardson 2016-01-16, 4:30 PM Pager [phone]

## 2016-01-11 DIAGNOSIS — I4891 Unspecified atrial fibrillation: Secondary | ICD-10-CM

## 2016-01-11 DIAGNOSIS — E876 Hypokalemia: Secondary | ICD-10-CM

## 2016-01-11 DIAGNOSIS — I5033 Acute on chronic diastolic (congestive) heart failure: Secondary | ICD-10-CM

## 2016-01-11 DIAGNOSIS — I480 Paroxysmal atrial fibrillation: Secondary | ICD-10-CM

## 2016-01-11 LAB — CBC
HCT: 34.1 % — ABNORMAL LOW (ref 36.0–46.0)
Hemoglobin: 10.5 g/dL — ABNORMAL LOW (ref 12.0–15.0)
MCH: 29.6 pg (ref 26.0–34.0)
MCHC: 30.8 g/dL (ref 30.0–36.0)
MCV: 96.1 fL (ref 78.0–100.0)
Platelets: 257 10*3/uL (ref 150–400)
RBC: 3.55 MIL/uL — ABNORMAL LOW (ref 3.87–5.11)
RDW: 14 % (ref 11.5–15.5)
WBC: 6.8 10*3/uL (ref 4.0–10.5)

## 2016-01-11 LAB — GLUCOSE, CAPILLARY
GLUCOSE-CAPILLARY: 106 mg/dL — AB (ref 65–99)
GLUCOSE-CAPILLARY: 177 mg/dL — AB (ref 65–99)
Glucose-Capillary: 110 mg/dL — ABNORMAL HIGH (ref 65–99)
Glucose-Capillary: 137 mg/dL — ABNORMAL HIGH (ref 65–99)

## 2016-01-11 LAB — HEPARIN LEVEL (UNFRACTIONATED): Heparin Unfractionated: 0.55 IU/mL (ref 0.30–0.70)

## 2016-01-11 MED ORDER — APIXABAN 5 MG PO TABS
5.0000 mg | ORAL_TABLET | Freq: Two times a day (BID) | ORAL | Status: DC
Start: 2016-01-11 — End: 2016-01-12
  Administered 2016-01-11 – 2016-01-12 (×2): 5 mg via ORAL
  Filled 2016-01-11 (×2): qty 1

## 2016-01-11 MED ORDER — POLYETHYLENE GLYCOL 3350 17 G PO PACK
17.0000 g | PACK | Freq: Every day | ORAL | Status: DC
  Administered 2016-01-11: 17 g via ORAL
  Filled 2016-01-11 (×2): qty 1

## 2016-01-11 MED ORDER — DIPHENHYDRAMINE HCL 25 MG PO CAPS
25.0000 mg | ORAL_CAPSULE | Freq: Once | ORAL | Status: AC
  Administered 2016-01-11: 25 mg via ORAL
  Filled 2016-01-11: qty 1

## 2016-01-11 NOTE — Consult Note (Signed)
   [facility] CM Inpatient Consult   01/11/2016  [name] Case 1932/01/26 [id number]   Patient screened for potential [place] Management services. Patient is eligible for [facility] Care Management services under patient's [place] Registry plan. Met with the patient at bedside.  Patient verbalized having a telephonic nurse to call every few months from her insurance.  She states she would like the brochure and read over it.  Encouraged the patient to call if she has post hospital needs.  Brochure and contact information was provided. Will update inpatient RNCM regarding patient's eligible for services, if needed.   Please place a [facility] Care Management consult or for questions contact:   Natividad Brood, RN BSN [facility] Liaison  [phone] business mobile phone Toll free office [phone]

## 2016-01-11 NOTE — Progress Notes (Signed)
PROGRESS NOTE    [name] Case  [id number] DOB: 1932/02/18 DOA: 01/08/2016 PCP: Hoyt Koch, MD    Brief Narrative:  82 y.o. WF PMHx Paroxysmal Atrial Fibrillation was on Eliquis until stopped 05/04/2015 by Dr. Candee Furbish cardiology (see cardiology note), HTN, Asthma, Obesity Hypoventilation syndrome (per Dr. Christinia Gully PC CM 05/28/2015 no indication for a time 02)   Presents to the ER because of shortness of breath. Patient has been in chronic shortness of breath and is being followed by cardiologist and pulmonologist. But over the last 2 weeks patient has been having increasing shortness of breath on exertion and at times at rest also. Denies any chest pain but did have some palpitations. Denies any cough fever or productive sputum. In the ER patient was found to be in A. fib with RVR and chest x-ray shows congestion. Patient was given Lasix 40 mg IV and started on Cardizem infusion. On my exam patient's blood pressure is in the low normal side around 96 systolic. Heart rate is around 110 bpm in A. fib. Patient is not in distress. Patient states she may have gained 10 pounds over the last 1 year. But states she has been compliant with her Lasix.   ED Course: Lasix 40 mg IV and Cardizem infusion was started.   Assessment & Plan:   Principal Problem:   Acute on chronic congestive heart failure (HCC) Active Problems:   Hypothyroidism   Hyperlipidemia   Asthma   Atrial fibrillation with RVR (HCC)   Acute respiratory failure with hypoxia (HCC)   Obesity hypoventilation syndrome (HCC)   Hypokalemia  Acute respiratory failure - Decompensated CHF? -EF 50-55% in May 2016 -Strict in and out since admission -1.6 L -Daily weight  Filed Weights   01/09/16 0101 01/10/16 0300 01/11/16 0600  Weight: 95.936 kg (211 lb 8 oz) 95.8 kg (211 lb 3.2 oz) 94.847 kg (209 lb 1.6 oz)    Parox A. fib with RVR(CHA2DS2 VASc score is 4) - On heparin Drip. Consulted cardiology re - oral  anticoagulation. Continue other meds. - Obesity hypoventilation syndrome -History of asthma - Stable - Hypothyroidism - Hypokalemia - Replete. Renal panel in am. - Hypertension - Optimize -  Mild anemia - follow CBC  DM Type 2 controlled with, complications -0000000 hemoglobin A1c= 6.4  HLD -Lipid panel Not within ADA guideline -Start Lipitor 20 mg daily    DVT prophylaxis:   Heparin drip  Code Status:  Full  Family Communication:  Spoke with granddaughter on phone  Disposition Plan:  DC in next 24-48 hours    Consultants:  NA  Procedures/Significant Events:  5/10 echocardiogram- LVEF= 50%- 55%.    Cultures NA  Antimicrobials: NA  Devices NA   LINES / TUBES:  NA    Continuous Infusions: . heparin 1,350 Units/hr (01/11/16 1454)     Subjective: 5/11  A/O 4 patient/granddaughter state patient has increasing DOE at home. Believes needs home O2 but was stopped by [facility] M. States unknown baseline weight. Does not weigh yourself daily. Cardiology unsure patient has true paroxysmal atrial fibrillation, appears was in the process of stopping anticoagulation. Per granddaughter also patient had hematuria while on Eliquis which was another reason for DC in anticoagulation.    Objective: Filed Vitals:   01/11/16 0100 01/11/16 0535 01/11/16 0600 01/11/16 1151  BP: 108/56 106/60  94/50  Pulse: 77 72    Temp: 98 F (36.7 C) 97.8 F (36.6 C)    TempSrc: Oral Oral  Resp: 18 16  18   Height:      Weight:   94.847 kg (209 lb 1.6 oz)   SpO2: 98% 97%  95%    Intake/Output Summary (Last 24 hours) at 01/11/16 1632 Last data filed at 01/11/16 1457  Gross per 24 hour  Intake  520.5 ml  Output   2025 ml  Net -1504.5 ml   Filed Weights   01/09/16 0101 01/10/16 0300 01/11/16 0600  Weight: 95.936 kg (211 lb 8 oz) 95.8 kg (211 lb 3.2 oz) 94.847 kg (209 lb 1.6 oz)    Examination:  General: A/O 4, NAD,No acute respiratory distress Eyes: negative scleral hemorrhage,  negative anisocoria, negative icterus ENT: Negative Runny nose, negative gingival bleeding, Neck:  Negative scars, masses, torticollis, lymphadenopathy, JVD Lungs: Clear to auscultation bilaterally without wheezes or crackles Cardiovascular: Regular rate and rhythm without murmur gallop or rub normal S1 and S2 Abdomen: negative abdominal pain, nondistended, positive soft, bowel sounds, no rebound, no ascites, no appreciable mass Extremities: No significant cyanosis, clubbing, or edema bilateral lower extremities Skin: Negative rashes, lesions, ulcers Psychiatric:  Negative depression, negative anxiety, negative fatigue, negative mania  Central nervous system:  Cranial nerves II through XII intact, tongue/uvula midline, all extremities muscle strength 5/5, sensation intact throughout, negative dysarthria, negative expressive aphasia, negative receptive aphasia..     Data Reviewed: Care during the described time interval was provided by me .  I have reviewed this patient's available data, including medical history, events of note, physical examination, and all test results as part of my evaluation. I have personally reviewed and interpreted all radiology studies.  CBC:  Recent Labs Lab 01/08/16 2137 01/09/16 0344 01/10/16 0307 01/11/16 0529  WBC 8.7 8.0 6.7 6.8  NEUTROABS  --  5.2  --   --   HGB 11.1* 10.9* 10.3* 10.5*  HCT 35.2* 33.0* 33.2* 34.1*  MCV 96.7 97.6 96.5 96.1  PLT 246 238 243 [id number]   Basic Metabolic Panel:  Recent Labs Lab 01/08/16 2137 01/09/16 0344 01/10/16 0307  NA 142 144 142  K 3.3* 3.3* 3.8  CL 103 104 103  CO2 31 28 30   GLUCOSE 123* 161* 120*  BUN 13 11 12   CREATININE 0.90 0.86 0.83  CALCIUM 8.9 8.7* 8.8*  MG  --   --  1.9   GFR: Estimated Creatinine Clearance: 57.3 mL/min (by C-G formula based on Cr of 0.83). Liver Function Tests:  Recent Labs Lab 01/09/16 0344  AST 20  ALT 21  ALKPHOS 50  BILITOT 0.5  PROT 6.0*  ALBUMIN 3.1*   No results  for input(s): LIPASE, AMYLASE in the last 168 hours. No results for input(s): AMMONIA in the last 168 hours. Coagulation Profile: No results for input(s): INR, PROTIME in the last 168 hours. Cardiac Enzymes:  Recent Labs Lab 01/09/16 0344 01/09/16 0634 01/09/16 1540  TROPONINI 0.03 <0.03 <0.03   BNP (last 3 results) No results for input(s): PROBNP in the last 8760 hours. HbA1C:  Recent Labs  01/09/16 0634  HGBA1C 6.4*   CBG:  Recent Labs Lab 01/10/16 1744 01/10/16 2135 01/11/16 0739 01/11/16 1126 01/11/16 1617  GLUCAP 110* 116* 106* 177* 110*   Lipid Profile: No results for input(s): CHOL, HDL, LDLCALC, TRIG, CHOLHDL, LDLDIRECT in the last 72 hours. Thyroid Function Tests:  Recent Labs  01/09/16 0344  TSH 2.829   Anemia Panel: No results for input(s): VITAMINB12, FOLATE, FERRITIN, TIBC, IRON, RETICCTPCT in the last 72 hours. Urine analysis:    Component  Value Date/Time   COLORURINE YELLOW 01/20/2015 [place] 01/20/2015 1153   LABSPEC 1.008 01/20/2015 1153   PHURINE 6.0 01/20/2015 1153   GLUCOSEU NEGATIVE 01/20/2015 1153   HGBUR LARGE* 01/20/2015 1153   BILIRUBINUR NEGATIVE 01/20/2015 1153   BILIRUBINUR n 07/01/2012 1115   KETONESUR NEGATIVE 01/20/2015 1153   PROTEINUR NEGATIVE 01/20/2015 1153   PROTEINUR n 07/01/2012 1115   UROBILINOGEN 0.2 01/20/2015 1153   UROBILINOGEN 0.2 07/01/2012 1115   NITRITE NEGATIVE 01/20/2015 1153   NITRITE n 07/01/2012 1115   LEUKOCYTESUR MODERATE* 01/20/2015 1153   Sepsis Labs: @LABRCNTIP (procalcitonin:4,lacticidven:4)  ) Recent Results (from the past 240 hour(s))  MRSA PCR Screening     Status: None   Collection Time: 01/09/16  2:02 AM  Result Value Ref Range Status   MRSA by PCR NEGATIVE NEGATIVE Final    Comment:        The GeneXpert MRSA Assay (FDA approved for NASAL specimens only), is one component of a comprehensive MRSA colonization surveillance program. It is not intended to diagnose  MRSA infection nor to guide or monitor treatment for MRSA infections.          Radiology Studies: No results found.      Scheduled Meds: . antiseptic oral rinse  7 mL Mouth Rinse BID  . atorvastatin  20 mg Oral q1800  . diltiazem  60 mg Oral Q8H  . furosemide  40 mg Intravenous Q12H  . insulin aspart  0-5 Units Subcutaneous QHS  . insulin aspart  0-9 Units Subcutaneous TID WC  . irbesartan  150 mg Oral Daily  . levothyroxine  100 mcg Oral QAC breakfast  . metoprolol succinate  25 mg Oral Daily  . mometasone-formoterol  2 puff Inhalation BID  . polyethylene glycol  17 g Oral Daily  . sodium chloride flush  3 mL Intravenous Q12H   Continuous Infusions: . heparin 1,350 Units/hr (01/11/16 1454)     LOS: 2 days    Time spent: 40 minutes    Bonnell Public, MD [facility] Pager [phone]   If 7PM-7AM, please contact night-coverage [email or web address] Password [facility] 01/11/2016, 4:32 PM

## 2016-01-11 NOTE — Consult Note (Signed)
ELECTROPHYSIOLOGY CONSULT NOTE    Patient ID: [name] Case MRN: [id number], DOB/AGE: March 04, 1932 82 y.o.  Admit date: 01/08/2016 Date of Consult: 01/11/2016  Primary Physician: Hoyt Koch, MD Primary Cardiologist: Dr. Marlou Porch  Reason for Consultation: AFib  HPI: [name] Case is a 82 y.o. female with PMHx of PAfib, HTN, DM, asthma, with some degree of chronic SOB and follows with pulmonology out patient, came with c/o increasing SOB for the period of a couple weeks, no CP but states she felt like it was heavy to breath and a component of R shoulder pain that is chronic, + palpitations on/off for weeks, she was admitted 01/09/16 to IM service, with AFib RVR and CHF.  EP is being asked to see the patient for recommendations on anticoagulation.  The patient was given IV lasix and started on cardizem gtt and heparin gtt.  Admit labs noted: Top I: 0.03, <0.03 x2 K+ 3.3 >> 3.8 BUN/Creat 13/0.90 pBNP 306 H.H 11.1/35.2 D.dimer 0.52  (high normal is 0.52) TSH 2.829  The patient was last seen by cardiology, Dr. Marlou Porch Sept 2016 at that time there was some question as to the accuracy of her AF diagnosis, and had her wear an EM with plans to stop a/c (and her BB given trouble with her asthma symptoms) if no AF was noted.  There is mention of hematuria, the patient cannot recall if that was with Eliquis or Coumadin, she describes it as "not much" that waxed-waned about a week and self terminated, she did not stop the a/c or seek attention for it, and is certain that the Eliquis was stopped well after that with discussion that Dr. Marlou Porch was not certain of her dx of AFib.  She apparently wore an EM, I can not find the results.   Past Medical History  Diagnosis Date  . Allergy   . Asthma   . Hyperlipidemia   . Hypertension   . Thyroid disease   . GERD (gastroesophageal reflux disease)   . CHF (congestive heart failure) ([place])   . Bronchitis   . Atrial fibrillation ([place])   .  Diabetes mellitus without complication [facility])      Surgical History:  Past Surgical History  Procedure Laterality Date  . Appendectomy    . Cholecystectomy    . Abdominal hysterectomy    . Total knee arthroplasty       Prescriptions prior to admission  Medication Sig Dispense Refill Last Dose  . amLODipine (NORVASC) 5 MG tablet TAKE 1 TABLET (5 MG TOTAL) BY MOUTH DAILY. 30 tablet 5 01/08/2016 at Unknown time  . budesonide-formoterol (SYMBICORT) 160-4.5 MCG/ACT inhaler Inhale 2 puffs into the lungs 2 (two) times daily. 1 Inhaler 3 Past Month at Unknown time  . furosemide (LASIX) 80 MG tablet Take 0.5 tablets (40 mg total) by mouth daily. 30 tablet 3 01/08/2016 at Unknown time  . irbesartan (AVAPRO) 150 MG tablet Take 1 tablet (150 mg total) by mouth daily. 30 tablet 11 01/08/2016 at Unknown time  . levothyroxine (SYNTHROID, LEVOTHROID) 100 MCG tablet Take 1 tablet (100 mcg total) by mouth daily. 90 tablet 3 01/08/2016 at Unknown time  . metoprolol succinate (TOPROL-XL) 25 MG 24 hr tablet Take 1 tablet (25 mg total) by mouth daily. 30 tablet 11 01/08/2016 at 0730  . metFORMIN (GLUCOPHAGE) 500 MG tablet Take 1 tablet (500 mg total) by mouth daily with breakfast. (Patient not taking: Reported on 01/08/2016) 90 tablet 3 Not Taking at Unknown time  .  predniSONE (DELTASONE) 20 MG tablet Take 3 tabs po on first day, 2 tabs second day, 2 tabs third day, 1 tab fourth day, 1 tab 5th day. Take with food. Start 10/31/15. (Patient not taking: Reported on 01/08/2016) 9 tablet 0 Completed Course at Unknown time    Inpatient Medications:  . antiseptic oral rinse  7 mL Mouth Rinse BID  . atorvastatin  20 mg Oral q1800  . diltiazem  60 mg Oral Q8H  . furosemide  40 mg Intravenous Q12H  . insulin aspart  0-5 Units Subcutaneous QHS  . insulin aspart  0-9 Units Subcutaneous TID WC  . irbesartan  150 mg Oral Daily  . levothyroxine  100 mcg Oral QAC breakfast  . metoprolol succinate  25 mg Oral Daily  .  mometasone-formoterol  2 puff Inhalation BID  . polyethylene glycol  17 g Oral Daily  . sodium chloride flush  3 mL Intravenous Q12H    Allergies:  Allergies  Allergen Reactions  . Codeine Hives and Rash    In cough syrup preparations    Social History   Social History  . Marital Status: Widowed    Spouse Name: N/A  . Number of Children: N/A  . Years of Education: N/A   Occupational History  . retired    Social History Main Topics  . Smoking status: Never Smoker   . Smokeless tobacco: Never Used  . Alcohol Use: No  . Drug Use: No  . Sexual Activity: Not on file   Other Topics Concern  . Not on file   Social History Narrative     Family History  Problem Relation Age of Onset  . Cancer Mother     colon  . Heart disease Mother      Review of Systems: All other systems reviewed and are otherwise negative except as noted above.  Physical Exam: Filed Vitals:   01/11/16 0100 01/11/16 0535 01/11/16 0600 01/11/16 1151  BP: 108/56 106/60  94/50  Pulse: 77 72    Temp: 98 F (36.7 C) 97.8 F (36.6 C)    TempSrc: Oral Oral    Resp: 18 16  18   Height:      Weight:   209 lb 1.6 oz (94.847 kg)   SpO2: 98% 97%  95%    GEN- The patient is well appearing, alert and oriented x 3 today.   HEENT: normocephalic, atraumatic; sclera clear, conjunctiva pink; hearing intact; oropharynx clear; neck supple, no JVP Lymph- no cervical lymphadenopathy Lungs- Clear to ausculation bilaterally, normal work of breathing.  No wheezes, rales, rhonchi Heart- Iregular rate and rhythm, no murmurs, rubs or gallops, PMI not laterally displaced GI- soft, non-tender, non-distended, bowel sounds present Extremities- no clubbing, cyanosis, or edema MS- no significant deformity or atrophy Skin- warm and dry, no rash or lesion Psych- euthymic mood, full affect Neuro- no gross deficits observed  Labs:   Lab Results  Component Value Date   WBC 6.8 01/11/2016   HGB 10.5* 01/11/2016   HCT  34.1* 01/11/2016   MCV 96.1 01/11/2016   PLT 257 01/11/2016    Recent Labs Lab 01/09/16 0344 01/10/16 0307  NA 144 142  K 3.3* 3.8  CL 104 103  CO2 28 30  BUN 11 12  CREATININE 0.86 0.83  CALCIUM 8.7* 8.8*  PROT 6.0*  --   BILITOT 0.5  --   ALKPHOS 50  --   ALT 21  --   AST 20  --   GLUCOSE  161* 120*      Radiology/Studies:  Dg Chest 2 View 01/08/2016  CLINICAL DATA:  Right sided chest pain and tachycardia beginning tonight. Congestive heart failure. Atrial fibrillation. EXAM: CHEST  2 VIEW COMPARISON:  01/20/2015 FINDINGS: Mild cardiomegaly remains stable. Increased mixed interstitial and airspace disease is seen involving the left lung greater than right. This may be due to asymmetric edema or less likely infection. No evidence of pleural effusion. IMPRESSION: New asymmetric interstitial and airspace disease, left side greater than right. This is most likely due to asymmetric edema, with infection considered less likely. Stable cardiomegaly. Electronically Signed   By: Earle Gell M.D.   On: 01/08/2016 21:59    EKG:  AFib, 111bpm, ST/T changes inf leads TELEMETRY: AFib, currently in the 70's, generally rate has been better controlled  01/09/16: Echocardiogram Study Conclusions - Left ventricle: The cavity size was normal. Systolic function was  normal. The estimated ejection fraction was in the range of 50%  to 55%. Wall motion was normal; there were no regional wall  motion abnormalities. - Aortic valve: Transvalvular velocity was within the normal range.  There was no stenosis. There was no regurgitation. - Mitral valve: There was trivial regurgitation. - Left atrium: The atrium was mildly dilated. (48mm) - Right ventricle: The cavity size was normal. Wall thickness was  normal. Systolic function was normal. - Tricuspid valve: There was trivial regurgitation. - Pulmonary arteries: Systolic pressure was within the normal  range. - Inferior vena cava: The vessel  was normal in size. The  respirophasic diameter changes were in the normal range (>= 50%),  consistent with normal central venous pressure.    Assessment and Plan:   1. SOB, CHF     Likely secondary to Afib, Fast VR     Getting lasix      Fluid neg - 3166ml     No ongoing SOB, exam appears compensated  2. PAFib RVR     Unknown onset     CHA2DS2Vasc is at least 4, on heparin gtt     Currently on cardizem 60mg  q8, metoprolol succ 25mg  daily     Recommend Eliquis 5mg  BID and rate control strategy for now given she has not been on full a/c.     F/u with Dr. Marlou Porch  3. HTN     Stable on current regime   Signed, Tommye Standard, PA-C 01/11/2016 4:46 PM  Agee with above Will transition from hep to [place] 5 bid  Would put her on daily ccb  Can followup with dr Marlou Porch  to discuss cardioversion in 4 weeks

## 2016-01-11 NOTE — Progress Notes (Signed)
ANTICOAGULATION CONSULT NOTE - Follow Up Consult  Pharmacy Consult for heparin transition to apixaban Indication: atrial fibrillation  Allergies  Allergen Reactions  . Codeine Hives and Rash    In cough syrup preparations    Patient Measurements: Height: 5\' 3"  (160 cm) Weight: 209 lb 1.6 oz (94.847 kg) IBW/kg (Calculated) : 52.4 Heparin Dosing Weight: 75 kg  Vital Signs: BP: 94/50 mmHg (05/12 1151)  Labs:  Recent Labs  01/08/16 2137 01/09/16 0344 01/09/16 0634  01/09/16 1540 01/09/16 2050 01/10/16 0307 01/11/16 0529  HGB 11.1* 10.9*  --   --   --   --  10.3* 10.5*  HCT 35.2* 33.0*  --   --   --   --  33.2* 34.1*  PLT 246 238  --   --   --   --  243 257  HEPARINUNFRC  --   --   --   < >  --  0.47 0.39 0.55  CREATININE 0.90 0.86  --   --   --   --  0.83  --   TROPONINI  --  0.03 <0.03  --  <0.03  --   --   --   < > = values in this interval not displayed.  Estimated Creatinine Clearance: 57.3 mL/min (by C-G formula based on Cr of 0.83).  Assessment:  82yo female c/o worsening SOB over past few days as well as CP, found in ED to be in Afib w/ RVR, to begin heparin. Pt had been on Eliquis in past for PAF though per notes Dr Marlou Porch took her off in late 3026 2/2 low evidence of active Afib. HL therapeutic x 2. CBC stable.   Pharmacy is consulted to transition patient from heparin to apixaban.  Goal of Therapy:  Monitor platelets by anticoagulation protocol: Yes   Plan:  Apixaban 5mg  BID Stop heparin Monitor for S&S of bleed Educate pt on apixaban  Andrey Cota. Diona Foley, PharmD, [place] Pharmacist Pager [phone]  01/11/2016,7:16 PM

## 2016-01-11 NOTE — Progress Notes (Signed)
ANTICOAGULATION CONSULT NOTE - Follow Up Consult  Pharmacy Consult for Heparin Indication: atrial fibrillation  Allergies  Allergen Reactions  . Codeine Hives and Rash    In cough syrup preparations    Patient Measurements: Height: 5\' 3"  (160 cm) Weight: 209 lb 1.6 oz (94.847 kg) IBW/kg (Calculated) : 52.4 Heparin Dosing Weight: 75kg  Vital Signs: Temp: 97.8 F (36.6 C) (05/12 0535) Temp Source: Oral (05/12 0535) BP: 106/60 mmHg (05/12 0535) Pulse Rate: 72 (05/12 0535)  Labs:  Recent Labs  01/08/16 2137 01/09/16 0344 01/09/16 0634  01/09/16 1540 01/09/16 2050 01/10/16 0307 01/11/16 0529  HGB 11.1* 10.9*  --   --   --   --  10.3* 10.5*  HCT 35.2* 33.0*  --   --   --   --  33.2* 34.1*  PLT 246 238  --   --   --   --  243 257  HEPARINUNFRC  --   --   --   < >  --  0.47 0.39 0.55  CREATININE 0.90 0.86  --   --   --   --  0.83  --   TROPONINI  --  0.03 <0.03  --  <0.03  --   --   --   < > = values in this interval not displayed.  Estimated Creatinine Clearance: 57.3 mL/min (by C-G formula based on Cr of 0.83).   Medications:  Heparin @ 1350 units/hr  Assessment: 84yof previously on eliquis for PAF which was stopped 05/2015 by Dr. Marlou Porch due to 'questionable diagnosis of PAF'. She then presented to the ED on 5/10 with afib RVR, likely 2/2 CHF exacerbation. She was started on IV heparin. Heparin level remains therapeutic at 0.55. CBC stable. No bleeding.   Current CHA2DS2-VASC = 6 (CHF, HTN, DM, female, age). Noted plan to possibly switch to just aspirin.  Goal of Therapy:  Heparin level 0.3-0.7 units/ml Monitor platelets by anticoagulation protocol: Yes   Plan:  1) Continue heparin at 1350 units/hr 2) Daily heparin level and CBC  Deboraha Sprang 01/11/2016,9:58 AM

## 2016-01-12 LAB — CBC
HCT: 34.6 % — ABNORMAL LOW (ref 36.0–46.0)
Hemoglobin: 11 g/dL — ABNORMAL LOW (ref 12.0–15.0)
MCH: 30.8 pg (ref 26.0–34.0)
MCHC: 31.8 g/dL (ref 30.0–36.0)
MCV: 96.9 fL (ref 78.0–100.0)
Platelets: 275 10*3/uL (ref 150–400)
RBC: 3.57 MIL/uL — ABNORMAL LOW (ref 3.87–5.11)
RDW: 13.7 % (ref 11.5–15.5)
WBC: 5.4 10*3/uL (ref 4.0–10.5)

## 2016-01-12 LAB — BASIC METABOLIC PANEL
Anion gap: 11 (ref 5–15)
BUN: 16 mg/dL (ref 6–20)
CO2: 31 mmol/L (ref 22–32)
Calcium: 9.2 mg/dL (ref 8.9–10.3)
Chloride: 100 mmol/L — ABNORMAL LOW (ref 101–111)
Creatinine, Ser: 0.95 mg/dL (ref 0.44–1.00)
GFR calc Af Amer: >60 mL/min (ref >60)
GFR calc non Af Amer: 54 mL/min — ABNORMAL LOW (ref >60)
Glucose, Bld: 129 mg/dL — ABNORMAL HIGH (ref 65–99)
Potassium: 3.8 mmol/L (ref 3.5–5.1)
Sodium: 142 mmol/L (ref 135–145)

## 2016-01-12 LAB — GLUCOSE, CAPILLARY
GLUCOSE-CAPILLARY: 116 mg/dL — AB (ref 65–99)
GLUCOSE-CAPILLARY: 115 mg/dL — AB (ref 65–99)

## 2016-01-12 MED ORDER — POLYETHYLENE GLYCOL 3350 17 G PO PACK: 17.0000 g | PACK | Freq: Every day | ORAL | Status: DC

## 2016-01-12 MED ORDER — FUROSEMIDE 10 MG/ML IJ SOLN
40.0000 mg | Freq: Two times a day (BID) | INTRAMUSCULAR | Status: DC
  Filled 2016-01-12: qty 4

## 2016-01-12 MED ORDER — FUROSEMIDE 40 MG PO TABS
40.0000 mg | ORAL_TABLET | Freq: Every day | ORAL | Status: DC
  Administered 2016-01-12: 40 mg via ORAL
  Filled 2016-01-12: qty 1

## 2016-01-12 MED ORDER — ATORVASTATIN CALCIUM 20 MG PO TABS: 20.0000 mg | ORAL_TABLET | Freq: Every day | ORAL | Status: DC

## 2016-01-12 MED ORDER — POTASSIUM CHLORIDE CRYS ER 10 MEQ PO TBCR
10.0000 meq | EXTENDED_RELEASE_TABLET | Freq: Every day | ORAL | Status: DC
  Administered 2016-01-12: 10 meq via ORAL
  Filled 2016-01-12: qty 1

## 2016-01-12 MED ORDER — DILTIAZEM HCL ER COATED BEADS 180 MG PO CP24: 180.0000 mg | ORAL_CAPSULE | Freq: Every day | ORAL | Status: DC

## 2016-01-12 MED ORDER — APIXABAN 5 MG PO TABS: 5.0000 mg | ORAL_TABLET | Freq: Two times a day (BID) | ORAL | Status: DC

## 2016-01-12 MED ORDER — POTASSIUM CHLORIDE CRYS ER 10 MEQ PO TBCR: 10.0000 meq | EXTENDED_RELEASE_TABLET | Freq: Every day | ORAL | Status: DC

## 2016-01-12 NOTE — Progress Notes (Signed)
PROGRESS NOTE  Subjective:    82 y.o. female with PMHx of PAfib, HTN, DM, asthma, with some degree of chronic SOB and follows with pulmonology out patient, came with c/o increasing SOB for the period of a couple weeks, no CP but states she felt like it was heavy to breath and a component of R shoulder pain that is chronic, + palpitations on/off for weeks, she was admitted 01/09/16 to IM service, with AFib RVR and CHF  She is feeling much better.  She has diuresed well with IV lasix  Objective:    Vital Signs:   Temp:  [98.1 F (36.7 C)-98.6 F (37 C)] 98.1 F (36.7 C) (05/13 0552) Pulse Rate:  [71-94] 94 (05/13 0552) Resp:  [18] 18 (05/13 0552) BP: (94-132)/(50-68) 132/68 mmHg (05/13 0628) SpO2:  [92 %-95 %] 92 % (05/13 0552) Weight:  [209 lb 7 oz (95 kg)] 209 lb 7 oz (95 kg) (05/13 0552)  Last BM Date: 01/07/16   24-hour weight change: Weight change: 5.4 oz (0.153 kg)  Weight trends: Filed Weights   01/10/16 0300 01/11/16 0600 01/12/16 0552  Weight: 211 lb 3.2 oz (95.8 kg) 209 lb 1.6 oz (94.847 kg) 209 lb 7 oz (95 kg)    Intake/Output:  05/12 0701 - 05/13 0700 In: 1200 [P.O.:1200] Out: 2600 [Urine:2600]     Physical Exam: BP 132/68 mmHg  Pulse 94  Temp(Src) 98.1 F (36.7 C) (Oral)  Resp 18  Ht 5\' 3"  (1.6 m)  Wt 209 lb 7 oz (95 kg)  BMI 37.11 kg/m2  SpO2 92%  Wt Readings from Last 3 Encounters:  01/12/16 209 lb 7 oz (95 kg)  09/20/15 205 lb 1.9 oz (93.042 kg)  06/25/15 202 lb (91.627 kg)    General: Vital signs reviewed and noted.   Head: Normocephalic, atraumatic.  Eyes: conjunctivae/corneas clear.  EOM's intact.   Throat: normal  Neck:  normal   Lungs:    mostly clear   Heart:  irreg irreg  Abdomen:  Soft, non-tender, non-distended    Extremities: No edema    Neurologic: A&O X3, CN II - XII are grossly intact.   Psych: Normal     Labs: BMET:  Recent Labs  01/10/16 0307 01/12/16 0616  NA 142 142  K 3.8 3.8  CL 103 100*  CO2 30  31  GLUCOSE 120* 129*  BUN 12 16  CREATININE 0.83 0.95  CALCIUM 8.8* 9.2  MG 1.9  --     Liver function tests: No results for input(s): AST, ALT, ALKPHOS, BILITOT, PROT, ALBUMIN in the last 72 hours. No results for input(s): LIPASE, AMYLASE in the last 72 hours.  CBC:  Recent Labs  01/11/16 0529 01/12/16 0616  WBC 6.8 5.4  HGB 10.5* 11.0*  HCT 34.1* 34.6*  MCV 96.1 96.9  PLT 257 275    Cardiac Enzymes:  Recent Labs  01/09/16 1540  TROPONINI <0.03    Coagulation Studies: No results for input(s): LABPROT, INR in the last 72 hours.  Other: Invalid input(s): POCBNP No results for input(s): DDIMER in the last 72 hours. No results for input(s): HGBA1C in the last 72 hours. No results for input(s): CHOL, HDL, LDLCALC, TRIG, CHOLHDL in the last 72 hours. No results for input(s): TSH, T4TOTAL, T3FREE, THYROIDAB in the last 72 hours.  Invalid input(s): FREET3 No results for input(s): VITAMINB12, FOLATE, FERRITIN, TIBC, IRON, RETICCTPCT in the last 72 hours.   Other results:  Tele   (  personally reviewed )  Atrial fib with controlled V response  Medications:    Infusions:    Scheduled Medications: . antiseptic oral rinse  7 mL Mouth Rinse BID  . apixaban  5 mg Oral BID  . atorvastatin  20 mg Oral q1800  . diltiazem  60 mg Oral Q8H  . furosemide  40 mg Oral Daily  . insulin aspart  0-5 Units Subcutaneous QHS  . insulin aspart  0-9 Units Subcutaneous TID WC  . irbesartan  150 mg Oral Daily  . levothyroxine  100 mcg Oral QAC breakfast  . metoprolol succinate  25 mg Oral Daily  . mometasone-formoterol  2 puff Inhalation BID  . polyethylene glycol  17 g Oral Daily  . potassium chloride  10 mEq Oral Daily  . sodium chloride flush  3 mL Intravenous Q12H    Assessment/ Plan:   Principal Problem:   Acute on chronic congestive heart failure (HCC) Active Problems:   Hypothyroidism   Hyperlipidemia   Asthma   Atrial fibrillation with RVR (HCC)   Acute  respiratory failure with hypoxia (HCC)   Obesity hypoventilation syndrome (HCC)   Hypokalemia  1. Atrial fib:   Rate is well controlled.  Has been changed to Eliquis. Continue current meds.  She should be able to be discharged today She has normal LV systolic function.   Likely has some diastolic dysfunction but we could not tell from this echo due to the atrial fib.  On home dose of lasix. I have added Kdur. Ive advised her to avoid salt - it sounds like she has been eating some extra salt in the past.  Follow up with Dr. Marlou Porch to arrange cardioversion    I have spent a total of 40 minutes with patient reviewing hospital  notes , discussion with family , reviewing  telemetry, EKGs, labs, echo and examining patient as well as establishing an assessment and plan that was discussed with the patient. > 50% of time was spent in direct patient care.    Thayer Headings, Brooke Bonito., MD, [facility] 01/12/2016, 10:32 AM [address],  [address] [place] Pager [phone]  Disposition:  Length of Stay: 3  Thayer Headings, Brooke Bonito., MD, [facility] 01/12/2016, 10:26 AM Office [phone] Pager [phone]

## 2016-01-12 NOTE — Discharge Summary (Signed)
Physician Discharge Summary  Patient ID: [name] Case MRN: [id number] DOB/AGE: June 15, 1932 82 y.o.  Admit date: 01/08/2016 Discharge date: 01/12/2016  Admission Diagnoses:  Discharge Diagnoses:  Principal Problem:   Acute on chronic congestive heart failure ([place]) Active Problems:   Hypothyroidism   Hyperlipidemia   Asthma   Atrial fibrillation with RVR (HCC)   Acute respiratory failure with hypoxia (HCC)   Obesity hypoventilation syndrome (HCC)   Hypokalemia   Discharged Condition: stable  Hospital Course: 82 y.o. female with medical history significant of paroxysmal atrial fibrillation, hypertension, asthma presents to the ER because of shortness of breath. Patient has been in chronic shortness of breath and is being followed by cardiologist and pulmonologist. But over the last 2 weeks patient has been having increasing shortness of breath on exertion and at times at rest also. Denies any chest pain but did have some palpitations. Denies any cough fever or productive sputum. In the ER patient was found to be in A. fib with RVR and chest x-ray shows congestion. Patient was given Lasix 40 mg IV and started on Cardizem infusion. On my exam patient's blood pressure is in the low normal side around 96 systolic. Heart rate is around 110 bpm in A. fib. Patient is not in distress. Patient states she may have gained 10 pounds over the last 1 year. But states she has been compliant with her Lasix.   Consults: Cardiology  Discharge Medication - Please see Med. Rec.   Discharge Exam: Blood pressure 132/68, pulse 94, temperature 98.1 F (36.7 C), temperature source Oral, resp. rate 18, height 5\' 3"  (1.6 m), weight 95 kg (209 lb 7 oz), SpO2 92 %.  Disposition: 01-Home or Self Care  Discharge Instructions    Call MD for:    Complete by:  As directed   Call MD for worsening symptoms     Diet - low sodium heart healthy    Complete by:  As directed      Diet Carb Modified    Complete by:  As  directed      Increase activity slowly    Complete by:  As directed             Medication List    STOP taking these medications        predniSONE 20 MG tablet  Commonly known as:  DELTASONE      TAKE these medications        amLODipine 5 MG tablet  Commonly known as:  NORVASC  TAKE 1 TABLET (5 MG TOTAL) BY MOUTH DAILY.     apixaban 5 MG Tabs tablet  Commonly known as:  ELIQUIS  Take 1 tablet (5 mg total) by mouth 2 (two) times daily.     atorvastatin 20 MG tablet  Commonly known as:  LIPITOR  Take 1 tablet (20 mg total) by mouth daily at 6 PM.     budesonide-formoterol 160-4.5 MCG/ACT inhaler  Commonly known as:  SYMBICORT  Inhale 2 puffs into the lungs 2 (two) times daily.     diltiazem 180 MG 24 hr capsule  Commonly known as:  CARDIZEM CD  Take 1 capsule (180 mg total) by mouth daily.     furosemide 80 MG tablet  Commonly known as:  LASIX  Take 0.5 tablets (40 mg total) by mouth daily.     irbesartan 150 MG tablet  Commonly known as:  AVAPRO  Take 1 tablet (150 mg total) by mouth daily.  levothyroxine 100 MCG tablet  Commonly known as:  SYNTHROID, LEVOTHROID  Take 1 tablet (100 mcg total) by mouth daily.     metFORMIN 500 MG tablet  Commonly known as:  GLUCOPHAGE  Take 1 tablet (500 mg total) by mouth daily with breakfast.     metoprolol succinate 25 MG 24 hr tablet  Commonly known as:  TOPROL-XL  Take 1 tablet (25 mg total) by mouth daily.     polyethylene glycol packet  Commonly known as:  MIRALAX / GLYCOLAX  Take 17 g by mouth daily.     potassium chloride 10 MEQ tablet  Commonly known as:  K-DUR,KLOR-CON  Take 1 tablet (10 mEq total) by mouth daily.           Follow-up Information    Follow up with Hoyt Koch, MD In 1 week.   Specialty:  Internal Medicine   Why:  Follow up with Cardiology   Contact information:   [place] [address] [phone]       Signed: Bonnell Public 01/12/2016, 10:51  AM

## 2016-01-17 ENCOUNTER — Encounter: Payer: Self-pay | Admitting: Internal Medicine

## 2016-01-17 ENCOUNTER — Ambulatory Visit (INDEPENDENT_AMBULATORY_CARE_PROVIDER_SITE_OTHER): Payer: Medicare Other | Admitting: Internal Medicine

## 2016-01-17 VITALS — BP 90/64 | HR 77 | Wt 208.0 lb

## 2016-01-17 DIAGNOSIS — I480 Paroxysmal atrial fibrillation: Secondary | ICD-10-CM | POA: Diagnosis not present

## 2016-01-17 DIAGNOSIS — I10 Essential (primary) hypertension: Secondary | ICD-10-CM | POA: Diagnosis not present

## 2016-01-17 DIAGNOSIS — Z5189 Encounter for other specified aftercare: Secondary | ICD-10-CM | POA: Diagnosis not present

## 2016-01-17 DIAGNOSIS — I5033 Acute on chronic diastolic (congestive) heart failure: Secondary | ICD-10-CM

## 2016-01-17 DIAGNOSIS — I5043 Acute on chronic combined systolic (congestive) and diastolic (congestive) heart failure: Secondary | ICD-10-CM | POA: Diagnosis not present

## 2016-01-17 MED ORDER — IRBESARTAN 150 MG PO TABS: 75.0000 mg | ORAL_TABLET | Freq: Every day | ORAL | Status: DC

## 2016-01-17 NOTE — Assessment & Plan Note (Signed)
AF - rate controlled On Eliquis, Diltiazem, Toprol, Lasix, Irbesartan D/c Norvasc F/u w/Dr Sharlet Salina, Dr Marlou Porch

## 2016-01-17 NOTE — Progress Notes (Signed)
Pre visit review using our clinic review tool, if applicable. No additional management support is needed unless otherwise documented below in the visit note. 

## 2016-01-17 NOTE — Assessment & Plan Note (Signed)
Rate controlled On Eliquis, Diltiazem, Toprol

## 2016-01-17 NOTE — Assessment & Plan Note (Signed)
Hypotensive now: d/c Norvasc and reduce Avapro

## 2016-01-17 NOTE — Assessment & Plan Note (Addendum)
AF - rate controlled On Eliquis, Diltiazem, Toprol, Lasix, Irbesartan D/c Norvasc F/u w/Dr Sharlet Salina, Dr Marlou Porch

## 2016-01-17 NOTE — Progress Notes (Signed)
Subjective:  Patient ID: [name] Case, female    DOB: Dec 23, 1931  Age: 82 y.o. MRN: [id number]  CC: No chief complaint on file.   HPI [name] Case presents for A fib and CHF hosp stay 5/9-5/13/17 - chart reviewed. Pt re-started amlodipine and she is also taking Diltiazem C/o weakness, lightheadedness, low BP...   Admit date: 01/08/2016 Discharge date: 01/12/2016  Admission Diagnoses:  Discharge Diagnoses:  Principal Problem:  Acute on chronic congestive heart failure ([place]) Active Problems:  Hypothyroidism  Hyperlipidemia  Asthma  Atrial fibrillation with RVR (HCC)  Acute respiratory failure with hypoxia (HCC)  Obesity hypoventilation syndrome (HCC)  Hypokalemia   Discharged Condition: stable  Hospital Course: 82 y.o. female with medical history significant of paroxysmal atrial fibrillation, hypertension, asthma presents to the ER because of shortness of breath. Patient has been in chronic shortness of breath and is being followed by cardiologist and pulmonologist. But over the last 2 weeks patient has been having increasing shortness of breath on exertion and at times at rest also. Denies any chest pain but did have some palpitations. Denies any cough fever or productive sputum. In the ER patient was found to be in A. fib with RVR and chest x-ray shows congestion. Patient was given Lasix 40 mg IV and started on Cardizem infusion. On my exam patient's blood pressure is in the low normal side around 96 systolic. Heart rate is around 110 bpm in A. fib. Patient is not in distress. Patient states she may have gained 10 pounds over the last 1 year. But states she has been compliant with her Lasix.   Consults: Cardiology  Discharge Medication - Please see Med. Rec.  Discharge Exam: Blood pressure 132/68, pulse 94, temperature 98.1 F (36.7 C), temperature source Oral, resp. rate 18, height 5\' 3"  (1.6 m), weight 95 kg (209 lb 7 oz), SpO2 92 %.  Disposition: 01-Home  or Self Care   Outpatient Prescriptions Prior to Visit  Medication Sig Dispense Refill  . apixaban (ELIQUIS) 5 MG TABS tablet Take 1 tablet (5 mg total) by mouth 2 (two) times daily. 60 tablet 1  . atorvastatin (LIPITOR) 20 MG tablet Take 1 tablet (20 mg total) by mouth daily at 6 PM. 30 tablet 1  . diltiazem (CARDIZEM CD) 180 MG 24 hr capsule Take 1 capsule (180 mg total) by mouth daily. 30 capsule 1  . furosemide (LASIX) 80 MG tablet Take 0.5 tablets (40 mg total) by mouth daily. 30 tablet 3  . levothyroxine (SYNTHROID, LEVOTHROID) 100 MCG tablet Take 1 tablet (100 mcg total) by mouth daily. 90 tablet 3  . metoprolol succinate (TOPROL-XL) 25 MG 24 hr tablet Take 1 tablet (25 mg total) by mouth daily. 30 tablet 11  . polyethylene glycol (MIRALAX / GLYCOLAX) packet Take 17 g by mouth daily. 30 each 0  . potassium chloride (K-DUR,KLOR-CON) 10 MEQ tablet Take 1 tablet (10 mEq total) by mouth daily. 30 tablet 0  . amLODipine (NORVASC) 5 MG tablet TAKE 1 TABLET (5 MG TOTAL) BY MOUTH DAILY. 30 tablet 5  . irbesartan (AVAPRO) 150 MG tablet Take 1 tablet (150 mg total) by mouth daily. 30 tablet 11  . budesonide-formoterol (SYMBICORT) 160-4.5 MCG/ACT inhaler Inhale 2 puffs into the lungs 2 (two) times daily. (Patient not taking: Reported on 01/17/2016) 1 Inhaler 3  . metFORMIN (GLUCOPHAGE) 500 MG tablet Take 1 tablet (500 mg total) by mouth daily with breakfast. (Patient not taking: Reported on 01/17/2016) 90 tablet 3  No facility-administered medications prior to visit.    ROS Review of Systems  Constitutional: Positive for fatigue. Negative for chills, activity change, appetite change and unexpected weight change.  HENT: Negative for congestion, mouth sores and sinus pressure.   Eyes: Negative for visual disturbance.  Respiratory: Positive for shortness of breath. Negative for cough and chest tightness.   Gastrointestinal: Negative for nausea and abdominal pain.  Genitourinary: Negative for  frequency, difficulty urinating and vaginal pain.  Musculoskeletal: Negative for back pain, gait problem and neck pain.  Skin: Negative for pallor and rash.  Neurological: Positive for weakness. Negative for dizziness, tremors, numbness and headaches.  Psychiatric/Behavioral: Negative for confusion and sleep disturbance.    Objective:  BP 90/64 mmHg  Pulse 77  Wt 208 lb (94.348 kg)  SpO2 93%  BP Readings from Last 3 Encounters:  01/17/16 90/64  01/12/16 132/68  10/30/15 157/73    Wt Readings from Last 3 Encounters:  01/17/16 208 lb (94.348 kg)  01/12/16 209 lb 7 oz (95 kg)  09/20/15 205 lb 1.9 oz (93.042 kg)    Physical Exam  Constitutional: She appears well-developed. No distress.  HENT:  Head: Normocephalic.  Right Ear: External ear normal.  Left Ear: External ear normal.  Nose: Nose normal.  Mouth/Throat: Oropharynx is clear and moist.  Eyes: Conjunctivae are normal. Pupils are equal, round, and reactive to light. Right eye exhibits no discharge. Left eye exhibits no discharge.  Neck: Normal range of motion. Neck supple. No JVD present. No tracheal deviation present. No thyromegaly present.  Cardiovascular: Normal rate, regular rhythm and normal heart sounds.   Pulmonary/Chest: No stridor. No respiratory distress. She has no wheezes.  Abdominal: Soft. Bowel sounds are normal. She exhibits no distension and no mass. There is no tenderness. There is no rebound and no guarding.  Musculoskeletal: She exhibits no edema or tenderness.  Lymphadenopathy:    She has no cervical adenopathy.  Neurological: She displays normal reflexes. No cranial nerve deficit. She exhibits normal muscle tone. Coordination normal.  Skin: No rash noted. No erythema.  Psychiatric: She has a normal mood and affect. Her behavior is normal. Judgment and thought content normal.  Obese  Lab Results  Component Value Date   WBC 5.4 01/12/2016   HGB 11.0* 01/12/2016   HCT 34.6* 01/12/2016   PLT 275  01/12/2016   GLUCOSE 129* 01/12/2016   CHOL 209* 06/08/2015   TRIG 147.0 06/08/2015   HDL 48.80 06/08/2015   LDLDIRECT 188.6 07/19/2007   LDLCALC 131* 06/08/2015   ALT 21 01/09/2016   AST 20 01/09/2016   NA 142 01/12/2016   K 3.8 01/12/2016   CL 100* 01/12/2016   CREATININE 0.95 01/12/2016   BUN 16 01/12/2016   CO2 31 01/12/2016   TSH 2.829 01/09/2016   INR 1.74* 01/12/2015   HGBA1C 6.4* 01/09/2016   MICROALBUR 1.2 06/08/2015    Dg Chest 2 View  01/08/2016  CLINICAL DATA:  Right sided chest pain and tachycardia beginning tonight. Congestive heart failure. Atrial fibrillation. EXAM: CHEST  2 VIEW COMPARISON:  01/20/2015 FINDINGS: Mild cardiomegaly remains stable. Increased mixed interstitial and airspace disease is seen involving the left lung greater than right. This may be due to asymmetric edema or less likely infection. No evidence of pleural effusion. IMPRESSION: New asymmetric interstitial and airspace disease, left side greater than right. This is most likely due to asymmetric edema, with infection considered less likely. Stable cardiomegaly. Electronically Signed   By: Sharrie Rothman.D.  On: 01/08/2016 21:59    Assessment & Plan:   Diagnoses and all orders for this visit:  Paroxysmal atrial fibrillation ([place])  Acute on chronic combined systolic and diastolic congestive heart failure (HCC)  Acute on chronic diastolic congestive heart failure ([place])  Essential hypertension  Other orders -     irbesartan (AVAPRO) 150 MG tablet; Take 0.5 tablets (75 mg total) by mouth daily.  I have discontinued Ms. Rebel's amLODipine and metFORMIN. I have also changed her irbesartan. Additionally, I am having her maintain her budesonide-formoterol, metoprolol succinate, levothyroxine, furosemide, apixaban, atorvastatin, polyethylene glycol, potassium chloride, and diltiazem.  Meds ordered this encounter  Medications  . irbesartan (AVAPRO) 150 MG tablet    Sig: Take 0.5 tablets (75 mg  total) by mouth daily.    Dispense:  30 tablet    Refill:  11     Follow-up: No Follow-up on file.  Walker Kehr, MD

## 2016-01-22 ENCOUNTER — Telehealth: Payer: Self-pay | Admitting: Cardiology

## 2016-01-22 NOTE — Telephone Encounter (Signed)
JESSICA  AWARE  EF  WAS  50 - 55%

## 2016-01-22 NOTE — Telephone Encounter (Signed)
[facility] RN called to obtain most recent EF on pt.  I called and left a message on confidential voicemail that 01/09/16 echo showed EF of 50-55%.

## 2016-01-22 NOTE — Telephone Encounter (Signed)
New Message:  [name] Case is calling in stating that the pt in enrolling into the heart failure program and she is needing the pt's most recent Ejection fraction from their last echo. Please assist.

## 2016-01-25 ENCOUNTER — Encounter: Payer: Self-pay | Admitting: Cardiology

## 2016-01-25 ENCOUNTER — Ambulatory Visit (INDEPENDENT_AMBULATORY_CARE_PROVIDER_SITE_OTHER): Payer: Medicare Other | Admitting: Cardiology

## 2016-01-25 VITALS — BP 134/86 | HR 70 | Ht 60.75 in | Wt 207.4 lb

## 2016-01-25 DIAGNOSIS — I481 Persistent atrial fibrillation: Secondary | ICD-10-CM

## 2016-01-25 DIAGNOSIS — E785 Hyperlipidemia, unspecified: Secondary | ICD-10-CM

## 2016-01-25 DIAGNOSIS — R0600 Dyspnea, unspecified: Secondary | ICD-10-CM

## 2016-01-25 DIAGNOSIS — I4819 Other persistent atrial fibrillation: Secondary | ICD-10-CM

## 2016-01-25 DIAGNOSIS — I10 Essential (primary) hypertension: Secondary | ICD-10-CM

## 2016-01-25 MED ORDER — METOPROLOL SUCCINATE ER 25 MG PO TB24: 25.0000 mg | ORAL_TABLET | Freq: Every day | ORAL | Status: DC

## 2016-01-25 NOTE — Patient Instructions (Signed)

## 2016-01-25 NOTE — Progress Notes (Signed)
Cardiology Office Note   Date:  01/25/2016   ID:  [name] Case, DOB [birth year], MRN [id number]  PCP:  Hoyt Koch, MD  Cardiologist:   Candee Furbish, MD       History of Present Illness: [name] Case is a 82 y.o. female who presents for follow-up visit for atrial fibrillation following recent hospitalization 01/12/16. She was having chronic shortness of breath seen by cardiology as well as pulmonology. No fever. In the emergency room was found to be in atrial fibrillation with right ventricular response. She was given Lasix.  Dr. Caryl Comes with elective physiology saw her in consultation. Echocardiogram on 01/09/16 showed EF of 50-55% with dilated left atrium 47 mm. Her risk score was at least 4. She was placed on Cardizem 60 mg every 8 hours and metoprolol succinate 25 g daily. He was recommended that she be discharged on Eliquis 5 mg twice a day. She was going to follow-up with me to discuss cardioversion.  Today, she is feeling much better. Less short of breath. Her granddaughter, [name] Case is here with her. She is eager to go to [place]. I do not think that cardioversion is necessary at this point given her lack of symptoms with well rate controlled atrial fibrillation.    Recent PFTs in August 2016 showed minimal obstructive airways disease with , minimal diffusion defect.    Past Medical History  Diagnosis Date  . Allergy   . Asthma   . Hyperlipidemia   . Hypertension   . Thyroid disease   . GERD (gastroesophageal reflux disease)   . CHF (congestive heart failure) ([place])   . Bronchitis   . Atrial fibrillation ([place])   . Diabetes mellitus without complication [facility])     Past Surgical History  Procedure Laterality Date  . Appendectomy    . Cholecystectomy    . Abdominal hysterectomy    . Total knee arthroplasty       Current Outpatient Prescriptions  Medication Sig Dispense Refill  . apixaban (ELIQUIS) 5 MG TABS tablet Take 1 tablet (5 mg total) by mouth 2  (two) times daily. 60 tablet 1  . atorvastatin (LIPITOR) 20 MG tablet Take 1 tablet (20 mg total) by mouth daily at 6 PM. 30 tablet 1  . budesonide-formoterol (SYMBICORT) 160-4.5 MCG/ACT inhaler Inhale 2 puffs into the lungs 2 (two) times daily as needed (Shortness of breath).    . diltiazem (CARDIZEM CD) 180 MG 24 hr capsule Take 1 capsule (180 mg total) by mouth daily. 30 capsule 1  . furosemide (LASIX) 80 MG tablet Take 0.5 tablets (40 mg total) by mouth daily. 30 tablet 3  . irbesartan (AVAPRO) 150 MG tablet Take 0.5 tablets (75 mg total) by mouth daily. 30 tablet 11  . levothyroxine (SYNTHROID, LEVOTHROID) 100 MCG tablet Take 1 tablet (100 mcg total) by mouth daily. 90 tablet 3  . metoprolol succinate (TOPROL-XL) 25 MG 24 hr tablet Take 1 tablet (25 mg total) by mouth daily. 90 tablet 3  . polyethylene glycol (MIRALAX / GLYCOLAX) packet Take 17 g by mouth daily as needed for mild constipation.    . potassium chloride (K-DUR,KLOR-CON) 10 MEQ tablet Take 1 tablet (10 mEq total) by mouth daily. 30 tablet 0   No current facility-administered medications for this visit.    Allergies:   Codeine    Social History:  The patient  reports that she has never smoked. She has never used smokeless tobacco. She reports that she does not drink  alcohol or use illicit drugs.   Family History:  The patient's family history includes Cancer in her mother; Heart disease in her mother.    ROS:  Please see the history of present illness.   Otherwise, review of systems are positive for shortness of breath snoring.   All other systems are reviewed and negative.    PHYSICAL EXAM: VS:  BP 134/86 mmHg  Pulse 70  Ht 5' 0.75" (1.543 m)  Wt 207 lb 6.4 oz (94.076 kg)  BMI 39.51 kg/m2 , BMI Body mass index is 39.51 kg/(m^2). GEN: Well nourished, well developed, in no acute distress HEENT: normal Neck: no JVD, carotid bruits, or masses, oxygen Cardiac: RRR; no murmurs, rubs, or gallops,no edema  Respiratory:   clear to auscultation bilaterally, normal work of breathing GI: soft, nontender, nondistended, + BS obese MS: no deformity or atrophy Skin: warm and dry, no rash Neuro:  Strength and sensation are intact Psych: euthymic mood, full affect   EKG:  EKG is not ordered today. Prior EKG shows atrial fibrillation with rapid ventricular response from early May 2017 hospitalization, personally viewed   Recent Labs: 01/08/2016: B Natriuretic Peptide 306.5* 01/09/2016: ALT 21; TSH 2.829 01/10/2016: Magnesium 1.9 01/12/2016: BUN 16; Creatinine, Ser 0.95; Hemoglobin 11.0*; Platelets 275; Potassium 3.8; Sodium 142    Lipid Panel    Component Value Date/Time   CHOL 209* 06/08/2015 1126   TRIG 147.0 06/08/2015 1126   HDL 48.80 06/08/2015 1126   CHOLHDL 4 06/08/2015 1126   VLDL 29.4 06/08/2015 1126   LDLCALC 131* 06/08/2015 1126   LDLDIRECT 188.6 07/19/2007 0000      Wt Readings from Last 3 Encounters:  01/25/16 207 lb 6.4 oz (94.076 kg)  01/17/16 208 lb (94.348 kg)  01/12/16 209 lb 7 oz (95 kg)    Echocardiogram: 01/08/15-EF 55%, no other abnormalities.  Other studies Reviewed: Additional studies/ records that were reviewed today include: Hospital records reviewed, lab work reviewed, echocardiogram reviewed. Review of the above records demonstrates: As above   ASSESSMENT AND PLAN:  1.  Dyspnea-She states that it is much improved since the hospitalization. She has well rate controlled atrial fibrillation. She has lost 20 pounds over the past year. She is feeling better. Death, her granddaughter is with her today and notices that she has a lot more vitality. I do not feel that we need to pursue cardioversion at this time. We will continue with rate control of her atrial fibrillation. Given her multiple risk factors, it is likely that she would referred back to atrial fibrillation regardless. BNP was reassuring, echocardiogram reassuring, mild PFT abnormalities consistent with COPD. Continue to  encourage conditioning, exercise, weight loss.  2. Paroxysmal atrial fibrillation-   -Clearly she was in atrial fibrillation during this last hospitalization in May 2017. She was also seen by Dr. Caryl Comes in electrophysiology. We will continue with Eliquis. Given her relative lack of symptoms, I'm comfortable with rate controlling her atrial fibrillation. Given her risk factors, even with cardioversion, she likely would referred back to atrial fibrillation.  3. Morbid obesity-continue to encourage weight loss. She is doing better. 20 pounds over the past year..     Current medicines are reviewed at length with the patient today.  The patient does not have concerns regarding medicines.  The following changes have been made:  no change  Labs/ tests ordered today include:   No orders of the defined types were placed in this encounter.    I will follow with results of  study, event monitor.  Signed, Candee Furbish, MD  01/25/2016 10:13 AM    [address], [place],   [address] Phone: [phone]; Fax: [phone]

## 2016-01-29 ENCOUNTER — Ambulatory Visit: Payer: Medicare Other | Admitting: Physician Assistant

## 2016-01-29 ENCOUNTER — Ambulatory Visit: Payer: Medicare Other | Admitting: Internal Medicine

## 2016-01-30 ENCOUNTER — Ambulatory Visit: Payer: Medicare Other | Admitting: Internal Medicine

## 2016-02-29 ENCOUNTER — Encounter: Payer: Self-pay | Admitting: Internal Medicine

## 2016-02-29 ENCOUNTER — Other Ambulatory Visit (INDEPENDENT_AMBULATORY_CARE_PROVIDER_SITE_OTHER): Payer: Medicare Other

## 2016-02-29 ENCOUNTER — Ambulatory Visit (INDEPENDENT_AMBULATORY_CARE_PROVIDER_SITE_OTHER): Payer: Medicare Other | Admitting: Internal Medicine

## 2016-02-29 VITALS — BP 132/84 | HR 91 | Temp 98.3°F | Resp 12 | Ht 61.0 in | Wt 212.0 lb

## 2016-02-29 DIAGNOSIS — R0602 Shortness of breath: Secondary | ICD-10-CM

## 2016-02-29 DIAGNOSIS — E039 Hypothyroidism, unspecified: Secondary | ICD-10-CM | POA: Diagnosis not present

## 2016-02-29 DIAGNOSIS — I4819 Other persistent atrial fibrillation: Secondary | ICD-10-CM

## 2016-02-29 DIAGNOSIS — I5033 Acute on chronic diastolic (congestive) heart failure: Secondary | ICD-10-CM

## 2016-02-29 DIAGNOSIS — I481 Persistent atrial fibrillation: Secondary | ICD-10-CM | POA: Diagnosis not present

## 2016-02-29 LAB — CBC
HCT: 39.1 % (ref 36.0–46.0)
HEMOGLOBIN: 13.1 g/dL (ref 12.0–15.0)
MCHC: 33.6 g/dL (ref 30.0–36.0)
MCV: 93.4 fl (ref 78.0–100.0)
PLATELETS: 271 10*3/uL (ref 150.0–400.0)
RBC: 4.18 Mil/uL (ref 3.87–5.11)
RDW: 14.3 % (ref 11.5–15.5)
WBC: 7.7 10*3/uL (ref 4.0–10.5)

## 2016-02-29 LAB — COMPREHENSIVE METABOLIC PANEL
ALT: 17 U/L (ref 0–35)
AST: 15 U/L (ref 0–37)
Albumin: 4.2 g/dL (ref 3.5–5.2)
Alkaline Phosphatase: 62 U/L (ref 39–117)
BILIRUBIN TOTAL: 0.5 mg/dL (ref 0.2–1.2)
BUN: 15 mg/dL (ref 6–23)
CALCIUM: 10 mg/dL (ref 8.4–10.5)
CHLORIDE: 102 meq/L (ref 96–112)
CO2: 34 meq/L — AB (ref 19–32)
Creatinine, Ser: 0.82 mg/dL (ref 0.40–1.20)
GFR: 70.87 mL/min (ref >60.00)
Glucose, Bld: 123 mg/dL — ABNORMAL HIGH (ref 70–99)
Potassium: 4 mEq/L (ref 3.5–5.1)
Sodium: 142 mEq/L (ref 135–145)
Total Protein: 7.2 g/dL (ref 6.0–8.3)

## 2016-02-29 LAB — BRAIN NATRIURETIC PEPTIDE: Pro B Natriuretic peptide (BNP): 174 pg/mL — ABNORMAL HIGH (ref 0.0–100.0)

## 2016-02-29 LAB — TSH: TSH: 1.17 u[IU]/mL (ref 0.35–4.50)

## 2016-02-29 LAB — T4, FREE: FREE T4: 1 ng/dL (ref 0.60–1.60)

## 2016-02-29 NOTE — Progress Notes (Signed)
   Subjective:    Patient ID: [name] Case, female    DOB: 05/29/1932, 82 y.o.   MRN: [id number]  HPI The patient is an 82 YO female coming in for SOB as well as follow up of her medical conditions. She is having SOB for about 2 weeks. Denies dietary changes or increased sodium intake. Has been taking her meds faithfully. Denies significant weight change (but is up about 5 pounds per our scale to prior weight). SOB with exertion. Okay sleeping at night time flat. Not taking her potassium pills for several weeks due to being out and not calling for refills. Medical conditions including thyroid (still taking meds, no heat or cold intolerance, some loose stools intermittent, no constipation), diabetes (recent HgA1c from hospital 6.4, on diet alone, not complicated, no low sugars, diet is okay).   Review of Systems  Constitutional: Positive for activity change. Negative for fever, appetite change, fatigue and unexpected weight change.  HENT: Negative.   Eyes: Negative.   Respiratory: Positive for shortness of breath. Negative for cough, chest tightness and wheezing.   Cardiovascular: Negative for chest pain, palpitations and leg swelling.  Gastrointestinal: Positive for abdominal distention. Negative for nausea, abdominal pain, diarrhea and constipation.       Loose stools  Musculoskeletal: Negative.   Skin: Negative.   Neurological: Negative.   Psychiatric/Behavioral: Negative.       Objective:   Physical Exam  Constitutional: She is oriented to person, place, and time. She appears well-developed and well-nourished.  Overweight  HENT:  Head: Normocephalic and atraumatic.  Eyes: EOM are normal.  Neck: Normal range of motion. No JVD present.  Cardiovascular: Normal rate and regular rhythm.   Pulmonary/Chest: Effort normal and breath sounds normal. No respiratory distress. She has no wheezes. She has no rales.  No wheezing on exam today  Abdominal: Soft. She exhibits no distension. There  is no tenderness. There is no rebound.  Musculoskeletal: She exhibits no edema.  Lymphadenopathy:    She has no cervical adenopathy.  Neurological: She is alert and oriented to person, place, and time. Coordination normal.  Skin: Skin is warm and dry.  Psychiatric: She has a normal mood and affect.   Filed Vitals:   02/29/16 0932  BP: 132/84  Pulse: 91  Temp: 98.3 F (36.8 C)  TempSrc: Oral  Resp: 12  Height: 5\' 1"  (1.549 m)  Weight: 212 lb (96.163 kg)  SpO2: 94%      Assessment & Plan:

## 2016-02-29 NOTE — Assessment & Plan Note (Signed)
Weight is up several pounds today and she is not doing any activity right now with the SOB. Reminded she needs to continue trying to be active and walking at least.

## 2016-02-29 NOTE — Assessment & Plan Note (Signed)
Checking TSH and free T4, taking synthroid 100 mcg daily and adjust as needed.

## 2016-02-29 NOTE — Progress Notes (Signed)
Pre visit review using our clinic review tool, if applicable. No additional management support is needed unless otherwise documented below in the visit note. 

## 2016-02-29 NOTE — Assessment & Plan Note (Signed)
Increase lasix to 80 mg daily for 3-4 days and call back if symptoms not improved. Checking CMP and BNP today and adjust therapy as needed. Likely will need potassium rx.

## 2016-02-29 NOTE — Assessment & Plan Note (Signed)
Still in A fib on exam, taking toprol, diltiazem and eliquis.

## 2016-02-29 NOTE — Patient Instructions (Signed)
We will check the labs today and call you back with the results.   We would like you to increase the lasix (furosemide) to 1 pill daily for the next 3-4 days. If your breathing is still bad when done with that please call the office back or the heart doctor's office.

## 2016-03-07 ENCOUNTER — Other Ambulatory Visit: Payer: Self-pay | Admitting: *Deleted

## 2016-03-07 MED ORDER — POTASSIUM CHLORIDE CRYS ER 10 MEQ PO TBCR: 10.0000 meq | EXTENDED_RELEASE_TABLET | Freq: Every day | ORAL | Status: DC

## 2016-03-07 NOTE — Telephone Encounter (Signed)
Pt requesting refill of Kcl. [place] per PCP. Rf sent. See meds.

## 2016-03-12 ENCOUNTER — Other Ambulatory Visit: Payer: Self-pay | Admitting: *Deleted

## 2016-03-12 MED ORDER — DILTIAZEM HCL ER COATED BEADS 180 MG PO CP24: 180.0000 mg | ORAL_CAPSULE | Freq: Every day | ORAL | Status: DC

## 2016-03-13 MED ORDER — APIXABAN 5 MG PO TABS: 5.0000 mg | ORAL_TABLET | Freq: Two times a day (BID) | ORAL | Status: DC

## 2016-03-13 MED ORDER — ATORVASTATIN CALCIUM 20 MG PO TABS: 20.0000 mg | ORAL_TABLET | Freq: Every day | ORAL | Status: DC

## 2016-03-13 NOTE — Addendum Note (Signed)
Addended by: Earnstine Regal on: 03/13/2016 09:43 AM   Modules accepted: Orders

## 2016-03-13 NOTE — Addendum Note (Signed)
Addended by: Earnstine Regal on: 03/13/2016 03:15 PM   Modules accepted: Orders

## 2016-03-26 ENCOUNTER — Telehealth: Payer: Self-pay | Admitting: Internal Medicine

## 2016-03-26 NOTE — Telephone Encounter (Signed)
Patient states she has a really bad yeast infection due to medication Dr. Sharlet Salina prescribe.  She would like to know if Dr. Sharlet Salina could send her something to [place].

## 2016-03-27 MED ORDER — FLUCONAZOLE 150 MG PO TABS: 150.0000 mg | ORAL_TABLET | ORAL | 0 refills | Status: DC

## 2016-03-27 NOTE — Telephone Encounter (Signed)
Sent in diflucan for her yeast infection. Take 1 pill today, then if still having symptoms take a second pill on Sunday, if still having symptoms can take a third pill on Tuesday next week. Also she should stop taking the lipitor while taking the diflucan.

## 2016-03-27 NOTE — Telephone Encounter (Signed)
Left message advising patient of dr crawford/s note---call office back if any questions

## 2016-04-10 ENCOUNTER — Ambulatory Visit (INDEPENDENT_AMBULATORY_CARE_PROVIDER_SITE_OTHER): Payer: Medicare Other | Admitting: Internal Medicine

## 2016-04-10 ENCOUNTER — Encounter: Payer: Self-pay | Admitting: Internal Medicine

## 2016-04-10 DIAGNOSIS — L439 Lichen planus, unspecified: Secondary | ICD-10-CM | POA: Diagnosis not present

## 2016-04-10 MED ORDER — CLOBETASOL PROPIONATE 0.05 % EX OINT: 1.0000 "application " | TOPICAL_OINTMENT | Freq: Two times a day (BID) | CUTANEOUS | 6 refills | Status: DC

## 2016-04-10 MED ORDER — BETAMETHASONE DIPROPIONATE 0.05 % EX CREA: TOPICAL_CREAM | Freq: Two times a day (BID) | CUTANEOUS | 6 refills | Status: DC

## 2016-04-10 NOTE — Progress Notes (Signed)
   Subjective:    Patient ID: [name] Case, female    DOB: [birth year], 82 y.o.   MRN: [id number]  HPI The patient is an 82 YO female coming in for itching and irritation in her labial and vaginal area. No new soaps or detergents. No new sexual partners since the death of her husband. No sores but she does scratch it raw due to itching. Then it hurts. No burning with urination or pressure. No fevers or chills. No discharge or crusting that she notices.   Review of Systems  Constitutional: Negative.   Respiratory: Negative.   Cardiovascular: Negative.   Genitourinary: Positive for vaginal pain. Negative for difficulty urinating, dyspareunia, dysuria, frequency, urgency, vaginal bleeding and vaginal discharge.       Vaginal and labial itching      Objective:   Physical Exam  Constitutional: She is oriented to person, place, and time. She appears well-developed and well-nourished.  Overweight  HENT:  Head: Normocephalic and atraumatic.  Eyes: EOM are normal.  Neck: Normal range of motion.  Cardiovascular: Normal rate and regular rhythm.   Pulmonary/Chest: Effort normal and breath sounds normal. No respiratory distress. She has no wheezes. She has no rales.  No wheezing on exam today  Abdominal: Soft. She exhibits no distension. There is no tenderness. There is no rebound.  Genitourinary: No vaginal discharge found.  Genitourinary Comments: External genital exam with findings consistent with lichen planus or sclerosus.   Musculoskeletal: She exhibits no edema.  Neurological: She is alert and oriented to person, place, and time. Coordination normal.  Skin: Skin is warm and dry.   Vitals:   04/10/16 1504  BP: 124/70  Pulse: 79  Resp: 16  Temp: 97.9 F (36.6 C)  TempSrc: Oral  SpO2: 95%  Weight: 214 lb (97.1 kg)  Height: 5' (1.524 m)      Assessment & Plan:

## 2016-04-10 NOTE — Patient Instructions (Signed)
We have sent in an ointment for the area that is called clobetasol. Use it twice a day to help heal the area.    Lichen Planus Lichen planus is a skin problem that causes redness, itching, small bumps, and sores. It can affect the skin in any area of the body. Some common areas affected include:  Arms, wrists, legs, or ankles.  Chest, back, or abdomen.  Genital areas such as the vulva and vagina.  Gums and inside of the mouth.  Scalp.  Fingernails or toenails. Treatment can help control the symptoms of this condition. The condition can last for a long time. It can take 6-18 months for it to go away. CAUSES The exact cause of this condition is not known. The condition is not passed from one person to another (not contagious). It may be related to an allergy or an autoimmune response. An autoimmune response occurs when the body's defense system (immune system) mistakenly attacks healthy tissues. RISK FACTORS This condition is more likely to develop in:  People who are older than 80 years of age.  People who take certain medicines.  People who have been exposed to certain dyes or chemicals.  People with hepatitis C. SYMPTOMS Symptoms of this condition may include:  Itching, which can be severe.  Small reddish or purple bumps on the skin. These may have flat tops and may be round or irregular shaped.  Redness or white patches on the gums or tongue.  Redness, soreness, or a burning feeling in the genital area. This may lead to pain or bleeding during sex.  Changes in the fingernails or toenails. The nails may become thin or rough. They may have ridges in them.  Redness or irritation of the eyes. This is rare. DIAGNOSIS This condition may be diagnosed based on:   A physical exam. The health care provider will examine your affected skin and check for changes inside your mouth.  Removal of a tissue sample (biopsy sample) to be looked at under a  microscope. TREATMENT Treatment for this condition may depend on the severity of symptoms. In some cases, no treatment is needed. If treatment is needed to control symptoms, it may include:  Creams or ointments (topical steroids) to help control itching and irritation.  Medicine to be taken by mouth.  A treatment in which your skin is exposed to ultraviolet light (phototherapy).  Lozenges that you suck on to help treat sores in the mouth. HOME CARE INSTRUCTIONS  Take over-the-counter and prescription medicines only as told by your health care provider.  Use creams or ointments as told by your health care provider.  Do not scratch the affected areas of skin.  Women should keep the vaginal area as clean and dry as possible. SEEK MEDICAL CARE IF:  You have increasing redness, swelling, or pain in the affected area.  You have fluid, blood, or pus coming from the affected area.  Your eyes become irritated.   This information is not intended to replace advice given to you by your health care provider. Make sure you discuss any questions you have with your health care provider.   Document Released: 01/09/2011 Document Revised: 05/09/2015 Document Reviewed: 11/13/2014 Elsevier Interactive Patient Education [employer].

## 2016-04-10 NOTE — Progress Notes (Signed)
Pre visit review using our clinic review tool, if applicable. No additional management support is needed unless otherwise documented below in the visit note. 

## 2016-04-11 DIAGNOSIS — L439 Lichen planus, unspecified: Secondary | ICD-10-CM | POA: Insufficient documentation

## 2016-04-11 NOTE — Assessment & Plan Note (Signed)
Rx for beclomethasone cream after clobetasol was too expensive for her and talked about etiology. If no improvement will refer to gyn. No evidence of yeast infection on exam.

## 2016-05-01 DIAGNOSIS — M7061 Trochanteric bursitis, right hip: Secondary | ICD-10-CM | POA: Diagnosis not present

## 2016-05-26 ENCOUNTER — Encounter: Payer: Medicare Other | Admitting: Internal Medicine

## 2016-06-16 ENCOUNTER — Other Ambulatory Visit: Payer: Medicare Other | Admitting: *Deleted

## 2016-06-16 ENCOUNTER — Telehealth: Payer: Self-pay | Admitting: Cardiology

## 2016-06-16 DIAGNOSIS — R0602 Shortness of breath: Secondary | ICD-10-CM

## 2016-06-16 NOTE — Telephone Encounter (Signed)
Called patient about her message. Patient c/o SOB and gaining 3.6 lbs over the last two days. Patient is taking Lasix 40 mg (1/2 tablet) by mouth daily. Consulted DOD, Dr. Saunders Revel. Dr. Saunders Revel recommends that patient come in for lab work, and increase Lasix to 80 mg (1 tablet) for 3 days, and then back to 40 mg daily. Patient verbalized understanding and will come to our office now for lab work.

## 2016-06-16 NOTE — Telephone Encounter (Signed)
New Message:    Pt have gained 3.6 lbs in 2 days,she also having swelling in her abdomen and shortness of breath with activities.Please call the pt today before 3:15 to advise pt. If not at [facility] [phone].

## 2016-06-17 LAB — BASIC METABOLIC PANEL
BUN: 17 mg/dL (ref 7–25)
CALCIUM: 9.1 mg/dL (ref 8.6–10.4)
CO2: 29 mmol/L (ref 20–31)
CREATININE: 0.94 mg/dL — AB (ref 0.60–0.88)
Chloride: 103 mmol/L (ref 98–110)
GLUCOSE: 84 mg/dL (ref 65–99)
Potassium: 3.5 mmol/L (ref 3.5–5.3)
Sodium: 145 mmol/L (ref 135–146)

## 2016-06-17 LAB — BRAIN NATRIURETIC PEPTIDE: Brain Natriuretic Peptide: 165.1 pg/mL — ABNORMAL HIGH (ref <100)

## 2016-06-17 NOTE — Telephone Encounter (Signed)
Patient aware of lab results. Per Dr. Saunders Revel, Potassium is low normal.  Given increased Lasix dose, please have patient double her usual potassium dose as well while she is taking Lasix 80 mg daily.  If her symptoms do not improve with increased Lasix, she will need to be scheduled for urgent clinic visit later this week. Patient stated she is feeling a lot better since she took the increased dose of Lasix 80 mg. Patient stated she lost about 10 pounds over night. Patient wanted to let Dr. Marlou Porch know, so I will forward message to him and his nurse. Patient verbalized understanding of instructions. Patient has follow-up visit in November already scheduled.

## 2016-06-23 ENCOUNTER — Telehealth: Payer: Self-pay | Admitting: Cardiology

## 2016-06-23 DIAGNOSIS — R600 Localized edema: Secondary | ICD-10-CM

## 2016-06-23 DIAGNOSIS — Z79899 Other long term (current) drug therapy: Secondary | ICD-10-CM

## 2016-06-23 DIAGNOSIS — I10 Essential (primary) hypertension: Secondary | ICD-10-CM

## 2016-06-23 NOTE — Telephone Encounter (Signed)
Reviewed information with Dr Marlou Porch who gave orders to increase pt's Furosemide back to 80 mg a day, double her K+ and recheck BMP in 1 week.

## 2016-06-23 NOTE — Telephone Encounter (Signed)
New message      Pt c/o medication issue:  1. Name of Medication:  furosemide  2. How are you currently taking this medication (dosage and times per day)?  80mg --1 tablet for 3 days  3. Are you having a reaction (difficulty breathing--STAT)?  no 4. What is your medication issue?  While pt was taking 80mg  daily, her sob, leg edema went away.  As soon as she started 40mg  daily, nurse states pt's weight has increased and she is getting sob and leg edema again.  Please call pt and let her know what to do

## 2016-06-24 NOTE — Telephone Encounter (Signed)
Reviewed information with Anderson Malta at [facility] who states she actually doesn't take orders and that we will need to call the pt.  Called the patient and reviewed the orders to increase Furosemide, double K+ and have blood work in 1 week.  She states understanding.  She will call back with any questions or concerns.  Lab scheduled for Nov 1.

## 2016-06-24 NOTE — Telephone Encounter (Signed)
Left message to c/b.

## 2016-06-24 NOTE — Telephone Encounter (Signed)
Follow Up:    Please call pt with medication changes and orders.

## 2016-06-24 NOTE — Telephone Encounter (Signed)
Left message for [name] Case to call back to discuss medication and lab orders.

## 2016-07-02 ENCOUNTER — Other Ambulatory Visit: Payer: Medicare Other | Admitting: *Deleted

## 2016-07-02 DIAGNOSIS — Z79899 Other long term (current) drug therapy: Secondary | ICD-10-CM

## 2016-07-02 DIAGNOSIS — I10 Essential (primary) hypertension: Secondary | ICD-10-CM | POA: Diagnosis not present

## 2016-07-02 DIAGNOSIS — R600 Localized edema: Secondary | ICD-10-CM

## 2016-07-02 LAB — BASIC METABOLIC PANEL
BUN: 19 mg/dL (ref 7–25)
CHLORIDE: 102 mmol/L (ref 98–110)
CO2: 27 mmol/L (ref 20–31)
Calcium: 8.9 mg/dL (ref 8.6–10.4)
Creat: 0.97 mg/dL — ABNORMAL HIGH (ref 0.60–0.88)
GLUCOSE: 124 mg/dL — AB (ref 65–99)
POTASSIUM: 3.9 mmol/L (ref 3.5–5.3)
Sodium: 143 mmol/L (ref 135–146)

## 2016-07-02 NOTE — Addendum Note (Signed)
Addended by: Eulis Foster on: 07/02/2016 11:19 AM   Modules accepted: Orders

## 2016-07-03 ENCOUNTER — Telehealth: Payer: Self-pay | Admitting: Internal Medicine

## 2016-07-03 ENCOUNTER — Ambulatory Visit (HOSPITAL_COMMUNITY)
Admission: EM | Admit: 2016-07-03 | Discharge: 2016-07-03 | Disposition: A | Discharge status: Home / Self Care | Payer: Medicare Other | Attending: Emergency Medicine | Admitting: Emergency Medicine

## 2016-07-03 ENCOUNTER — Encounter (HOSPITAL_COMMUNITY): Payer: Self-pay | Admitting: Emergency Medicine

## 2016-07-03 DIAGNOSIS — B369 Superficial mycosis, unspecified: Secondary | ICD-10-CM | POA: Diagnosis not present

## 2016-07-03 DIAGNOSIS — L02214 Cutaneous abscess of groin: Secondary | ICD-10-CM | POA: Diagnosis not present

## 2016-07-03 MED ORDER — BACITRACIN ZINC 500 UNIT/GM EX OINT
TOPICAL_OINTMENT | CUTANEOUS | Status: AC
  Filled 2016-07-03: qty 0.9

## 2016-07-03 MED ORDER — FLUCONAZOLE 150 MG PO TABS: 150.0000 mg | ORAL_TABLET | Freq: Once | ORAL | 0 refills | Status: AC

## 2016-07-03 MED ORDER — CEPHALEXIN 500 MG PO CAPS: 500.0000 mg | ORAL_CAPSULE | Freq: Three times a day (TID) | ORAL | 0 refills | Status: AC

## 2016-07-03 NOTE — ED Triage Notes (Signed)
Here for abscess near vagina onset 3 days... Getting bigger  Also reports some bloody drainage w/pus  Denies pain, fevers.   A&O x4... NAD

## 2016-07-03 NOTE — Telephone Encounter (Signed)
Patient Name: [name] Case  DOB: Feb 09, 1932    Initial Comment Caller states has a bump on inner thigh   Nurse Assessment  Nurse: Raphael Gibney, RN, Vera Date/Time (Eastern Time): 07/03/2016 10:35:05 AM  Confirm and document reason for call. If symptomatic, describe symptoms. You must click the next button to save text entered. ---Caller states she has a bump on her left inner thigh. Noticed bump last night. Today it looks like it has come to a head and is draining pus. Has a foul odor. No fever. She is red in her vaginal area.  Has the patient traveled out of the country within the last 30 days? ---Not Applicable  Does the patient have any new or worsening symptoms? ---Yes  Will a triage be completed? ---Yes  Related visit to physician within the last 2 weeks? ---No  Does the PT have any chronic conditions? (i.e. diabetes, asthma, etc.) ---Yes  List chronic conditions. ---A fib; CHF  Is this a behavioral health or substance abuse call? ---No     Guidelines    Guideline Title Affirmed Question Affirmed Notes  Boil (Skin Abscess) [1] Boil > 1/2 inch across (> 12 mm; larger than a marble) AND [2] center is soft or pus colored    Final Disposition User   See Physician within 24 Hours Hunter, Therapist, sports, Vanita Ingles    Comments  No appts available within 24 hrs with female providers at [place]. pt does not want to see a female provider. States she will go to urgent care by [facility].   Referrals  GO TO FACILITY OTHER - SPECIFY   Disagree/Comply: Comply

## 2016-07-03 NOTE — Discharge Instructions (Signed)
Start Keflex 3 times a day as directed. Take Diflucan 1 tablet now. Repeat 1 tablet in 3 days and then again 1 tablet at end of antibiotic. Apply warm compresses to abscess to help with drainage. Keep clean and dry. Follow-up with your primary care provider in 4 days if not improving.

## 2016-07-03 NOTE — ED Provider Notes (Signed)
CSN: [id number]     Arrival date & time 07/03/16  1124 History   First MD Initiated Contact with Patient 07/03/16 1330     Chief Complaint  Patient presents with  . Abscess   (Consider location/radiation/quality/duration/timing/severity/associated sxs/prior Treatment) 82 year old female presents with abscess on left side of groin on upper thigh for the past week. Has started draining 3 days ago. Slightly painful. Has applied Neosporin ointment with minimal relief. Also concerned over rash on left inner folds of skin near labia for the past few days. Itching and irritating. Has applied Gold bond powder with minimal relief. No fever, dysuria, unusual vaginal discharge or GI symptoms. Hx of borderline diabetes- managing with diet.    The history is provided by the patient.    Past Medical History:  Diagnosis Date  . Allergy   . Asthma   . Atrial fibrillation ([place])   . Bronchitis   . CHF (congestive heart failure) ([place])   . Diabetes mellitus without complication ([place])   . GERD (gastroesophageal reflux disease)   . Hyperlipidemia   . Hypertension   . Thyroid disease    Past Surgical History:  Procedure Laterality Date  . ABDOMINAL HYSTERECTOMY    . APPENDECTOMY    . CHOLECYSTECTOMY    . TOTAL KNEE ARTHROPLASTY     Family History  Problem Relation Age of Onset  . Cancer Mother     colon  . Heart disease Mother    Social History  Substance Use Topics  . Smoking status: Never Smoker  . Smokeless tobacco: Never Used  . Alcohol use No   OB History    No data available     Review of Systems  Constitutional: Negative for chills, fatigue and fever.  Gastrointestinal: Negative for abdominal pain, constipation, diarrhea, nausea and vomiting.  Genitourinary: Negative for dysuria, hematuria, vaginal discharge and vaginal pain.  Skin: Positive for rash and wound.  Neurological: Negative for dizziness, weakness, numbness and headaches.  Hematological: Negative for adenopathy.     Allergies  Codeine  Home Medications   Prior to Admission medications   Medication Sig Start Date End Date Taking? Authorizing Provider  apixaban (ELIQUIS) 5 MG TABS tablet Take 1 tablet (5 mg total) by mouth 2 (two) times daily. 03/13/16  Yes Hoyt Koch, MD  atorvastatin (LIPITOR) 20 MG tablet Take 1 tablet (20 mg total) by mouth daily at 6 PM. 03/13/16  Yes Hoyt Koch, MD  diltiazem (CARDIZEM CD) 180 MG 24 hr capsule Take 1 capsule (180 mg total) by mouth daily. 03/12/16  Yes Hoyt Koch, MD  furosemide (LASIX) 80 MG tablet Take 0.5 tablets (40 mg total) by mouth daily. 12/17/15  Yes Hoyt Koch, MD  irbesartan (AVAPRO) 150 MG tablet Take 0.5 tablets (75 mg total) by mouth daily. 01/17/16  Yes Evie Lacks Plotnikov, MD  levothyroxine (SYNTHROID, LEVOTHROID) 100 MCG tablet Take 1 tablet (100 mcg total) by mouth daily. 12/07/15  Yes Hoyt Koch, MD  metoprolol succinate (TOPROL-XL) 25 MG 24 hr tablet Take 1 tablet (25 mg total) by mouth daily. 01/25/16  Yes Jerline Pain, MD  potassium chloride (K-DUR,KLOR-CON) 10 MEQ tablet Take 1 tablet (10 mEq total) by mouth daily. 03/07/16  Yes Hoyt Koch, MD  budesonide-formoterol [facility]) 160-4.5 MCG/ACT inhaler Inhale 2 puffs into the lungs 2 (two) times daily as needed (Shortness of breath).    Historical Provider, MD  cephALEXin (KEFLEX) 500 MG capsule Take 1 capsule (500 mg  total) by mouth 3 (three) times daily. 07/03/16 07/10/16  Katy Apo, NP  polyethylene glycol (MIRALAX / GLYCOLAX) packet Take 17 g by mouth daily as needed for mild constipation.    Historical Provider, MD   Meds Ordered and Administered this Visit  Medications - No data to display  BP 114/55 (BP Location: Right Arm)   Pulse 82   Temp 98.2 F (36.8 C) (Oral)   Resp 18   SpO2 96%  No data found.   Physical Exam  Constitutional: She is oriented to person, place, and time. She appears well-developed and  well-nourished. No distress.  Cardiovascular: Normal rate and regular rhythm.   Abdominal: Soft. Bowel sounds are normal. She exhibits no mass. There is no tenderness. There is no guarding. Hernia confirmed negative in the right inguinal area and confirmed negative in the left inguinal area.  Genitourinary:    There is no rash, tenderness or lesion on the right labia. There is rash on the left labia. There is no tenderness or lesion on the left labia.  Genitourinary Comments: Pink maculopapular rash with distinct borders present along left inner groin near labia. About 6cm in length. No discharge or crusting. Non-tender. Moist-appearing.   Musculoskeletal: Normal range of motion.       Legs: Round red slightly raised abscess present on left inner groin area - about the size of a quarter. Yellow dried drainage present in center of abscess. Tender with some surrounding erythema.   Lymphadenopathy:       Right: No inguinal adenopathy present.       Left: No inguinal adenopathy present.  Neurological: She is alert and oriented to person, place, and time.  Skin: Skin is warm and dry. Capillary refill takes less than 2 seconds. Lesion and rash noted. Rash is maculopapular.  Psychiatric: She has a normal mood and affect. Her behavior is normal. Judgment and thought content normal.    Urgent Care Course   Clinical Course    .Marland KitchenIncision and Drainage Date/Time: 07/03/2016 1:30 PM Performed by: Melvyn Neth, Naevia Unterreiner BERRY Authorized by: Melony Overly   Consent:    Consent obtained:  Verbal   Consent given by:  Patient   Risks discussed:  Incomplete drainage, infection and pain   Alternatives discussed:  Referral, observation and delayed treatment Location:    Type:  Abscess   Size:  1.5cm   Location:  Lower extremity   Lower extremity location:  Leg   Leg location:  L upper leg Pre-procedure details:    Skin preparation:  Betadine Anesthesia (see MAR for exact dosages):    Anesthesia method:   Topical application   Topical anesthesia: topical spray. Procedure type:    Complexity:  Simple Procedure details:    Needle aspiration: no     Incision types:  Stab incision   Incision depth:  Dermal   Scalpel size: used 18g needle.   Drainage:  Bloody and purulent   Drainage amount:  Scant   Wound treatment:  Wound left open   Packing materials:  None Post-procedure details:    Patient tolerance of procedure:  Tolerated well, no immediate complications Comments:     Minimal yellow and bloody discharge obtained- not sufficient for wound culture. Left open to drain. Applied Bacitracin ointment and covered with gauze.    (including critical care time)  Labs Review Labs Reviewed - No data to display  Imaging Review No results found.   Visual Acuity Review  Right Eye Distance:  Left Eye Distance:   Bilateral Distance:    Right Eye Near:   Left Eye Near:    Bilateral Near:         MDM   1. Abscess of left groin   2. Superficial fungal infection of skin    Performed I & D of abscess as prescribed above. Recommend apply warm compresses to area to help with drainage. Cover today and clean with soap and water tomorrow. Start Keflex 500mg  3 times a day as directed.  Discussed that rash near labia appears fungal- can be more common if glucose levels are elevated such as in diabetes. Patient had labwork drawn yesterday- awaiting results. Recommend start Diflucan 150mg  one tablet today, repeat 1 tablet in 3 days and then 1 tablet again at end of antibiotic. Follow-up with her primary care provider if abscess does not heal or rash does not start resolving within 5 days.      Katy Apo, NP 07/04/16 [phone]

## 2016-07-11 DIAGNOSIS — M1711 Unilateral primary osteoarthritis, right knee: Secondary | ICD-10-CM | POA: Diagnosis not present

## 2016-07-21 ENCOUNTER — Other Ambulatory Visit: Payer: Self-pay | Admitting: Internal Medicine

## 2016-07-28 ENCOUNTER — Ambulatory Visit (INDEPENDENT_AMBULATORY_CARE_PROVIDER_SITE_OTHER): Payer: Medicare Other | Admitting: Cardiology

## 2016-07-28 ENCOUNTER — Encounter: Payer: Self-pay | Admitting: Cardiology

## 2016-07-28 VITALS — BP 116/68 | HR 88 | Ht 60.75 in | Wt 222.2 lb

## 2016-07-28 DIAGNOSIS — I482 Chronic atrial fibrillation: Secondary | ICD-10-CM

## 2016-07-28 DIAGNOSIS — I4821 Permanent atrial fibrillation: Secondary | ICD-10-CM

## 2016-07-28 DIAGNOSIS — R0600 Dyspnea, unspecified: Secondary | ICD-10-CM | POA: Diagnosis not present

## 2016-07-28 DIAGNOSIS — Z7901 Long term (current) use of anticoagulants: Secondary | ICD-10-CM

## 2016-07-28 DIAGNOSIS — Z23 Encounter for immunization: Secondary | ICD-10-CM | POA: Diagnosis not present

## 2016-07-28 MED ORDER — APIXABAN 5 MG PO TABS: 5.0000 mg | ORAL_TABLET | Freq: Two times a day (BID) | ORAL | 3 refills | Status: DC

## 2016-07-28 MED ORDER — METOPROLOL SUCCINATE ER 50 MG PO TB24: 50.0000 mg | ORAL_TABLET | Freq: Every day | ORAL | 3 refills | Status: DC

## 2016-07-28 MED ORDER — POTASSIUM CHLORIDE ER 10 MEQ PO TBCR: 10.0000 meq | EXTENDED_RELEASE_TABLET | Freq: Every day | ORAL | 3 refills | Status: DC

## 2016-07-28 NOTE — Progress Notes (Signed)
Cardiology Office Note   Date:  07/28/2016   ID:  [name], DOB 1931-09-30, MRN [id number]  PCP:  Hoyt Koch, MD  Cardiologist:   Candee Furbish, MD       History of Present Illness: TAHIRY Case is a 82 y.o. female who presents for follow-up visit for atrial fibrillation- hospitalization 01/12/16. She was having chronic shortness of breath seen by cardiology as well as pulmonology. No fever. In the emergency room was found to be in atrial fibrillation with rapid ventricular response. She was given Lasix.   At one point in 2016, we ordered an event monitor and after 30 days, no atrial fibrillation was discovered. She desired to come off of her Eliquis at that point. Subsequently however she had shown [place] once again atrial fibrillation, admission on 01/08/16.  Dr. Caryl Comes with EP saw her in consultation. Echocardiogram on 01/09/16 showed EF of 50-55% with dilated left atrium 47 mm. Her risk score was at least 4. She was placed on Cardizem 60 mg every 8 hours and metoprolol succinate 25 mg daily. He recommended that she be discharged on Eliquis 5 mg twice a day. She was going to follow-up with me to discuss cardioversion.  Today, she is feeling much better. Less short of breath. She still has some sluggishness and at times can feel her heart beating funny in the early morning hours. Sometimes she drinks some water in this helps. Her granddaughter, [name] Case was here with her at prior visit. She has been helping with her 4 children. We did not pursue cardioversion at that time given her lack of symptoms with well rate controlled atrial fibrillation (low likelihood of NSR maintenance).  Recent PFTs in August 2016 showed minimal obstructive airways disease with , minimal diffusion defect.    Past Medical History:  Diagnosis Date  . Allergy   . Asthma   . Atrial fibrillation ([place])   . Bronchitis   . CHF (congestive heart failure) ([place])   . Diabetes mellitus without complication  ([place])   . GERD (gastroesophageal reflux disease)   . Hyperlipidemia   . Hypertension   . Thyroid disease     Past Surgical History:  Procedure Laterality Date  . ABDOMINAL HYSTERECTOMY    . APPENDECTOMY    . CHOLECYSTECTOMY    . TOTAL KNEE ARTHROPLASTY       Current Outpatient Prescriptions  Medication Sig Dispense Refill  . apixaban (ELIQUIS) 5 MG TABS tablet Take 1 tablet (5 mg total) by mouth 2 (two) times daily. 180 tablet 3  . atorvastatin (LIPITOR) 20 MG tablet Take 1 tablet (20 mg total) by mouth daily at 6 PM. 30 tablet 5  . budesonide-formoterol (SYMBICORT) 160-4.5 MCG/ACT inhaler Inhale 2 puffs into the lungs 2 (two) times daily as needed (Shortness of breath).    . diltiazem (CARDIZEM CD) 180 MG 24 hr capsule Take 1 capsule (180 mg total) by mouth daily. 30 capsule 5  . furosemide (LASIX) 80 MG tablet Take 0.5 tablets (40 mg total) by mouth daily. 30 tablet 3  . irbesartan (AVAPRO) 150 MG tablet Take 0.5 tablets (75 mg total) by mouth daily. 30 tablet 11  . levothyroxine (SYNTHROID, LEVOTHROID) 100 MCG tablet Take 1 tablet (100 mcg total) by mouth daily. 90 tablet 3  . metoprolol succinate (TOPROL-XL) 50 MG 24 hr tablet Take 1 tablet (50 mg total) by mouth daily. Take with or immediately following a meal. 90 tablet 3  . polyethylene glycol (MIRALAX /  GLYCOLAX) packet Take 17 g by mouth daily as needed for mild constipation.    . potassium chloride (K-DUR) 10 MEQ tablet Take 1 tablet (10 mEq total) by mouth daily. 90 tablet 3   No current facility-administered medications for this visit.     Allergies:   Codeine    Social History:  The patient  reports that she has never smoked. She has never used smokeless tobacco. She reports that she does not drink alcohol or use drugs.   Family History:  The patient's family history includes Cancer in her mother; Heart disease in her mother.    ROS:  Please see the history of present illness.   Otherwise, review of systems are  positive for shortness of breath snoring.   All other systems are reviewed and negative.    PHYSICAL EXAM: VS:  BP 116/68   Pulse 88   Ht 5' 0.75" (1.543 m)   Wt 222 lb 3.2 oz (100.8 kg)   LMP  (LMP Unknown)   BMI 42.33 kg/m  , BMI Body mass index is 42.33 kg/m. GEN: Well nourished, well developed, in no acute distress  HEENT: normal  Neck: no JVD, carotid bruits, or masses, oxygen Cardiac: Irreg irreg, mildly tachy; no murmurs, rubs, or gallops,no edema  Respiratory:  clear to auscultation bilaterally, normal work of breathing GI: soft, nontender, nondistended, + BS obese MS: no deformity or atrophy  Skin: warm and dry, no rash Neuro:  Strength and sensation are intact Psych: euthymic mood, full affect   EKG:  EKG is not ordered today. Prior EKG shows atrial fibrillation with rapid ventricular response from early May 2017 hospitalization, personally viewed   Recent Labs: 01/10/2016: Magnesium 1.9 02/29/2016: ALT 17; Hemoglobin 13.1; Platelets 271.0; Pro B Natriuretic peptide (BNP) 174.0; TSH 1.17 06/16/2016: Brain Natriuretic Peptide 165.1 07/02/2016: BUN 19; Creat 0.97; Potassium 3.9; Sodium 143    Lipid Panel    Component Value Date/Time   CHOL 209 (H) 06/08/2015 1126   TRIG 147.0 06/08/2015 1126   HDL 48.80 06/08/2015 1126   CHOLHDL 4 06/08/2015 1126   VLDL 29.4 06/08/2015 1126   LDLCALC 131 (H) 06/08/2015 1126   LDLDIRECT 188.6 07/19/2007 0000      Wt Readings from Last 3 Encounters:  07/28/16 222 lb 3.2 oz (100.8 kg)  04/10/16 214 lb (97.1 kg)  02/29/16 212 lb (96.2 kg)    Echocardiogram: 01/08/15-EF 55%, no other abnormalities.  Other studies Reviewed: Additional studies/ records that were reviewed today include: Hospital records reviewed, lab work reviewed, echocardiogram reviewed. Review of the above records demonstrates: As above   ASSESSMENT AND PLAN:  Permanent atrial fibrillation  - Rate control strategy. She is on both metoprolol as well as  diltiazem.  - I will increase her metoprolol to 50 mg once a day. Extended release.  - On auscultation today her heart rate was around 100 bpm  - Maintenance of sinus rhythm would be difficult at this point.  Dyspnea  - Previous BNP was reassuring, 165. Echocardiogram showed normal ejection fraction.  - mild PFT abnormalities consistent with COPD. Continue to encourage conditioning, exercise, weight loss.  Chronic anticoagulation  - No evidence of bleeding. Doing well.  - Brief, 1 day mild hematuria, resolved spontaneously.  - Creatinine 0.97, hemoglobin 13.1 at last check.  Morbid obesity-continue to encourage weight loss.  She also inquired about getting a flu shot today-we will check into this.  Signed, Candee Furbish, MD  07/28/2016 10:10 AM    [facility] [place], [place], [place]  [address] Phone: [phone]; Fax: [phone]

## 2016-07-28 NOTE — Patient Instructions (Signed)
Medication Instructions:  Your physician has recommended you make the following change in your medication: Increase metoprolol succinate to 50 mg by mouth daily.    Labwork: none  Testing/Procedures: none  Follow-Up: Your physician wants you to follow-up in: 6 months.  You will receive a reminder letter in the mail two months in advance. If you don't receive a letter, please call our office to schedule the follow-up appointment.   Any Other Special Instructions Will Be Listed Below (If Applicable).     If you need a refill on your cardiac medications before your next appointment, please call your pharmacy.

## 2016-07-29 ENCOUNTER — Other Ambulatory Visit: Payer: Self-pay | Admitting: Internal Medicine

## 2016-08-20 ENCOUNTER — Other Ambulatory Visit: Payer: Self-pay | Admitting: Internal Medicine

## 2016-09-08 ENCOUNTER — Other Ambulatory Visit: Payer: Self-pay | Admitting: Internal Medicine

## 2016-09-25 ENCOUNTER — Ambulatory Visit: Payer: Medicare Other | Admitting: Internal Medicine

## 2016-10-14 ENCOUNTER — Telehealth: Payer: Self-pay | Admitting: Cardiology

## 2016-10-14 NOTE — Telephone Encounter (Signed)
I spoke with pt and gave her instructions from Dr. Marlou Porch.  She has already taken 40 mg Lasix today so will take another 40 mg now.

## 2016-10-14 NOTE — Telephone Encounter (Signed)
New message      Pt c/o Shortness Of Breath: STAT if SOB developed within the last 24 hours or pt is noticeably SOB on the phone  1. Are you currently SOB (can you hear that pt is SOB on the phone)? yes 2. How long have you been experiencing SOB?  Yesterday and today 3. Are you SOB when sitting or when up moving around? both  4. Are you currently experiencing any other symptoms? Pt states she is very sob and nervous.  She want to see Dr Marlou Porch today

## 2016-10-14 NOTE — Telephone Encounter (Signed)
Received call transferred directly from operator and spoke with pt.  She reports shortness of breath for last couple of weeks. Progressively getting worse. States yesterday and today have been bad.  Sounds short of breath on phone. Does not know heart rate.  Shortness of breath is now at rest and with exertion.  No swelling. States she has been gaining weight but does not weigh daily. Pt requesting to see MD today.

## 2016-10-14 NOTE — Telephone Encounter (Signed)
It would be reasonable to increase her Lasix to 80 mg for 3 days. I would also recommend her seeing her primary physician to see if she is having any active wheezing/pulmonary exacerbation given her underlying mild COPD.  Candee Furbish, MD

## 2016-10-16 ENCOUNTER — Ambulatory Visit (INDEPENDENT_AMBULATORY_CARE_PROVIDER_SITE_OTHER)
Admission: RE | Admit: 2016-10-16 | Discharge: 2016-10-16 | Disposition: A | Discharge status: Home / Self Care | Payer: Medicare Other | Source: Ambulatory Visit | Attending: Internal Medicine | Admitting: Internal Medicine

## 2016-10-16 ENCOUNTER — Ambulatory Visit (INDEPENDENT_AMBULATORY_CARE_PROVIDER_SITE_OTHER): Payer: Medicare Other | Admitting: Internal Medicine

## 2016-10-16 ENCOUNTER — Encounter: Payer: Self-pay | Admitting: Internal Medicine

## 2016-10-16 VITALS — BP 142/76 | HR 66 | Temp 97.9°F | Ht 60.75 in | Wt 226.0 lb

## 2016-10-16 DIAGNOSIS — R0602 Shortness of breath: Secondary | ICD-10-CM

## 2016-10-16 DIAGNOSIS — I5033 Acute on chronic diastolic (congestive) heart failure: Secondary | ICD-10-CM

## 2016-10-16 NOTE — Progress Notes (Signed)
   Subjective:    Patient ID: [name] Case, female    DOB: Jun 27, 1932, 83 y.o.   MRN: [id number]  HPI The patient is an 83 YO female coming in for SOB for about 2 weeks with walking. She called her cardiologist asked her to double lasix for 3 days and this has helped marginally. She then was asked to come here to make sure her lungs sound okay given her history of asthma/mild COPD. She denies increase in cough. Not smoking. Still doing symbicort as needed but has not tried with this SOB. She denies cough at all. No nasal drainage. No cold or flu symptoms. She does not weigh at home but has doubled lasix for 3 days and urinating slightly more. Weight back in November was 222 and in August was 215 so her weight today is increased.   Review of Systems  Constitutional: Positive for activity change and fatigue. Negative for appetite change, chills, fever and unexpected weight change.  HENT: Negative.   Eyes: Negative.   Respiratory: Positive for shortness of breath. Negative for cough, chest tightness and wheezing.   Cardiovascular: Negative.   Gastrointestinal: Negative.   Musculoskeletal: Negative.   Skin: Negative.   Neurological: Positive for weakness.      Objective:   Physical Exam  Constitutional: She is oriented to person, place, and time. She appears well-developed and well-nourished.  HENT:  Head: Normocephalic and atraumatic.  Right Ear: External ear normal.  Left Ear: External ear normal.  Mouth/Throat: Oropharynx is clear and moist.  Eyes: EOM are normal.  Neck: Normal range of motion.  Hard to tell if jvd with habitus  Cardiovascular: Normal rate and regular rhythm.   Pulmonary/Chest: Effort normal. No respiratory distress. She has no wheezes.  Abdominal: Soft. She exhibits no distension. There is no tenderness. There is no rebound.  Musculoskeletal: She exhibits no edema.  Lymphadenopathy:    She has no cervical adenopathy.  Neurological: She is alert and oriented to  person, place, and time.  Skin: Skin is warm and dry.   Vitals:   10/16/16 1300  BP: (!) 142/76  Pulse: 66  Temp: 97.9 F (36.6 C)  TempSrc: Oral  SpO2: 98%  Weight: 226 lb (102.5 kg)  Height: 5' 0.75" (1.543 m)      Assessment & Plan:

## 2016-10-16 NOTE — Assessment & Plan Note (Signed)
Suspect this is the etiology as lungs without rhonchi or wheezing and she is above usual weight by at least 4 pounds (likely about 15 pounds from previous dry weight around 215). Advised to take double lasix dose for another 5 days. Checking CXR for hidden lung findings. Treat as appropriate.

## 2016-10-16 NOTE — Patient Instructions (Signed)
We are going to check a chest x-ray today and call you back with the results.   We think the breathing is coming from too much fluid and want you to keep doubling the lasix (furosemide) for another 5 days to help bring your weight down as you are still about 4-5 pounds above where your weight normally is.   Try taking the symbicort 1-2 times per day to see if this can help a little with your breathing as well.

## 2016-10-16 NOTE — Progress Notes (Signed)
Pre visit review using our clinic review tool, if applicable. No additional management support is needed unless otherwise documented below in the visit note. 

## 2016-10-24 DIAGNOSIS — M1612 Unilateral primary osteoarthritis, left hip: Secondary | ICD-10-CM | POA: Diagnosis not present

## 2016-10-24 DIAGNOSIS — M7062 Trochanteric bursitis, left hip: Secondary | ICD-10-CM | POA: Diagnosis not present

## 2016-10-28 ENCOUNTER — Other Ambulatory Visit: Payer: Self-pay | Admitting: Internal Medicine

## 2016-11-12 ENCOUNTER — Other Ambulatory Visit: Payer: Self-pay | Admitting: Internal Medicine

## 2016-11-15 ENCOUNTER — Other Ambulatory Visit: Payer: Self-pay | Admitting: Internal Medicine

## 2016-11-30 ENCOUNTER — Emergency Department (HOSPITAL_COMMUNITY): Payer: Medicare Other

## 2016-11-30 ENCOUNTER — Encounter (HOSPITAL_COMMUNITY): Payer: Self-pay

## 2016-11-30 DIAGNOSIS — Z7901 Long term (current) use of anticoagulants: Secondary | ICD-10-CM | POA: Insufficient documentation

## 2016-11-30 DIAGNOSIS — J45909 Unspecified asthma, uncomplicated: Secondary | ICD-10-CM | POA: Insufficient documentation

## 2016-11-30 DIAGNOSIS — E039 Hypothyroidism, unspecified: Secondary | ICD-10-CM | POA: Diagnosis not present

## 2016-11-30 DIAGNOSIS — I5033 Acute on chronic diastolic (congestive) heart failure: Secondary | ICD-10-CM | POA: Diagnosis not present

## 2016-11-30 DIAGNOSIS — Z96659 Presence of unspecified artificial knee joint: Secondary | ICD-10-CM | POA: Insufficient documentation

## 2016-11-30 DIAGNOSIS — E119 Type 2 diabetes mellitus without complications: Secondary | ICD-10-CM | POA: Diagnosis not present

## 2016-11-30 DIAGNOSIS — R0602 Shortness of breath: Secondary | ICD-10-CM | POA: Diagnosis not present

## 2016-11-30 DIAGNOSIS — R600 Localized edema: Secondary | ICD-10-CM | POA: Diagnosis not present

## 2016-11-30 DIAGNOSIS — R0600 Dyspnea, unspecified: Secondary | ICD-10-CM | POA: Diagnosis not present

## 2016-11-30 DIAGNOSIS — I110 Hypertensive heart disease with heart failure: Secondary | ICD-10-CM | POA: Insufficient documentation

## 2016-11-30 LAB — BASIC METABOLIC PANEL
Anion gap: 10 (ref 5–15)
BUN: 20 mg/dL (ref 6–20)
CHLORIDE: 103 mmol/L (ref 101–111)
CO2: 29 mmol/L (ref 22–32)
CREATININE: 0.91 mg/dL (ref 0.44–1.00)
Calcium: 8.9 mg/dL (ref 8.9–10.3)
GFR calc non Af Amer: 57 mL/min — ABNORMAL LOW (ref >60)
Glucose, Bld: 124 mg/dL — ABNORMAL HIGH (ref 65–99)
POTASSIUM: 4 mmol/L (ref 3.5–5.1)
Sodium: 142 mmol/L (ref 135–145)

## 2016-11-30 LAB — CBC
HEMATOCRIT: 36.7 % (ref 36.0–46.0)
Hemoglobin: 11.7 g/dL — ABNORMAL LOW (ref 12.0–15.0)
MCH: 31.3 pg (ref 26.0–34.0)
MCHC: 31.9 g/dL (ref 30.0–36.0)
MCV: 98.1 fL (ref 78.0–100.0)
PLATELETS: 261 10*3/uL (ref 150–400)
RBC: 3.74 MIL/uL — AB (ref 3.87–5.11)
RDW: 13.8 % (ref 11.5–15.5)
WBC: 6.7 10*3/uL (ref 4.0–10.5)

## 2016-11-30 LAB — I-STAT TROPONIN, ED: Troponin i, poc: 0 ng/mL (ref 0.00–0.08)

## 2016-11-30 NOTE — ED Triage Notes (Signed)
Pt presents with c/o SOB she states for " a while"; pt state SOB has gotten worse today; pt has hx of CHF; pt a&ox 4 on arrival. Pt denies pain at triage; Pt able to speak in full sentence on arrival. Pt lives alone;

## 2016-12-01 ENCOUNTER — Emergency Department (HOSPITAL_COMMUNITY)
Admission: EM | Admit: 2016-12-01 | Discharge: 2016-12-01 | Disposition: A | Discharge status: Home / Self Care | Payer: Medicare Other | Attending: Emergency Medicine | Admitting: Emergency Medicine

## 2016-12-01 DIAGNOSIS — R0602 Shortness of breath: Secondary | ICD-10-CM

## 2016-12-01 DIAGNOSIS — I5033 Acute on chronic diastolic (congestive) heart failure: Secondary | ICD-10-CM

## 2016-12-01 MED ORDER — FUROSEMIDE 10 MG/ML IJ SOLN
40.0000 mg | Freq: Once | INTRAMUSCULAR | Status: AC
  Administered 2016-12-01: 40 mg via INTRAVENOUS
  Filled 2016-12-01: qty 4

## 2016-12-01 MED ORDER — FUROSEMIDE 40 MG PO TABS: 40.0000 mg | ORAL_TABLET | Freq: Two times a day (BID) | ORAL | 0 refills | Status: DC

## 2016-12-01 NOTE — Discharge Instructions (Signed)
You were seen today for shortness of breath. This is likely related to your heart failure. You need to increase her Lasix to 40 mg twice daily for the next 3 days. Follow-up with your primary physician or cardiologist for recheck of lab work and volume status at that time. If you have worsening symptoms you need to be reevaluated.

## 2016-12-01 NOTE — ED Notes (Signed)
Pt stable, ambulatory, states understanding of discharge instructions 

## 2016-12-01 NOTE — ED Provider Notes (Signed)
[facility] DEPT Provider Note   CSN: [id number] Arrival date & time: 11/30/16  2230  By signing my name below, I, Neta Mends, attest that this documentation has been prepared under the direction and in the presence of Merryl Hacker, MD . Electronically Signed: Neta Mends, ED Scribe. 12/01/2016. 1:24 AM.    History   Chief Complaint Chief Complaint  Patient presents with  . Shortness of Breath    The history is provided by the patient. No language interpreter was used.   HPI Comments:  [name] Case is a 83 y.o. female with PMHx of CHF and A-fib who presents to the Emergency Department complaining of worsening SOB x 1 week. She states that the SOB is worse with walking and mildly worsens when laying down. She denies having to use extra pillows. Pt complains of associated mild ankle swelling. Pt is not a smoker. No alleviating factors noted.  Pt denies fever, cough, chest pain, nausea, vomiting, abdominal pain.   Past Medical History:  Diagnosis Date  . Allergy   . Asthma   . Atrial fibrillation ([place])   . Bronchitis   . CHF (congestive heart failure) ([place])   . Diabetes mellitus without complication ([place])   . GERD (gastroesophageal reflux disease)   . Hyperlipidemia   . Hypertension   . Thyroid disease     Patient Active Problem List   Diagnosis Date Noted  . Lichen planus 62/13/0865  . Acute respiratory failure with hypoxia ([place])   . Obesity hypoventilation syndrome ([place])   . Hypokalemia   . Acute on chronic congestive heart failure ([place]) 01/09/2016  . Atrial fibrillation with RVR ([place]) 01/09/2016  . Dyspnea 05/28/2015  . History of snoring 05/20/2015  . Chronic respiratory failure with hypercapnia ([place])   . Type 2 diabetes mellitus without complication ([place])   . Atrial fibrillation ([place]) 01/06/2015  . Hypothyroidism 03/09/2007  . Hyperlipidemia 03/09/2007  . Morbid obesity ([place]) 03/09/2007  . Essential hypertension 03/09/2007  . ALLERGIC  RHINITIS 03/09/2007  . Asthma 03/09/2007  . GERD 03/09/2007  . INCONTINENCE 03/09/2007    Past Surgical History:  Procedure Laterality Date  . ABDOMINAL HYSTERECTOMY    . APPENDECTOMY    . CHOLECYSTECTOMY    . TOTAL KNEE ARTHROPLASTY      OB History    No data available       Home Medications    Prior to Admission medications   Medication Sig Start Date End Date Taking? Authorizing Provider  apixaban (ELIQUIS) 5 MG TABS tablet Take 1 tablet (5 mg total) by mouth 2 (two) times daily. 07/28/16   Jerline Pain, MD  atorvastatin (LIPITOR) 20 MG tablet TAKE 1 TABLET BY MOUTH DAILY AT 6PM 11/12/16   Hoyt Koch, MD  budesonide-formoterol [facility]) 160-4.5 MCG/ACT inhaler Inhale 2 puffs into the lungs 2 (two) times daily as needed (Shortness of breath).    Historical Provider, MD  diltiazem (CARDIZEM CD) 180 MG 24 hr capsule TAKE 1 CAPSULE BY MOUTH DAILY 09/08/16   Hoyt Koch, MD  furosemide (LASIX) 40 MG tablet Take 1 tablet (40 mg total) by mouth 2 (two) times daily. 12/01/16   Merryl Hacker, MD  furosemide (LASIX) 80 MG tablet TAKE 1/2 TABLET BY MOUTH DAILY 11/17/16   Hoyt Koch, MD  irbesartan (AVAPRO) 150 MG tablet Take 0.5 tablets (75 mg total) by mouth daily. 01/17/16   Aleksei Plotnikov V, MD  levothyroxine (SYNTHROID, LEVOTHROID) 100 MCG tablet Take 1  tablet (100 mcg total) by mouth daily. 12/07/15   Hoyt Koch, MD  metoprolol succinate (TOPROL-XL) 50 MG 24 hr tablet Take 1 tablet (50 mg total) by mouth daily. Take with or immediately following a meal. 07/28/16 10/26/16  Jerline Pain, MD  polyethylene glycol (MIRALAX / GLYCOLAX) packet Take 17 g by mouth daily as needed for mild constipation.    Historical Provider, MD  potassium chloride (K-DUR) 10 MEQ tablet Take 1 tablet (10 mEq total) by mouth daily. 07/28/16   Jerline Pain, MD    Family History Family History  Problem Relation Age of Onset  . Cancer Mother     colon  . Heart  disease Mother     Social History Social History  Substance Use Topics  . Smoking status: Never Smoker  . Smokeless tobacco: Never Used  . Alcohol use No     Allergies   Codeine   Review of Systems Review of Systems  Constitutional: Negative for fever.  Respiratory: Positive for shortness of breath. Negative for cough.   Cardiovascular: Positive for leg swelling. Negative for chest pain.  Gastrointestinal: Negative for abdominal pain, nausea and vomiting.  All other systems reviewed and are negative.    Physical Exam Updated Vital Signs BP (!) 145/86   Pulse 75   Temp 98.4 F (36.9 C) (Oral)   Resp 14   Ht 5' 0.75" (1.543 m)   Wt 232 lb 7 oz (105.4 kg)   LMP  (LMP Unknown)   SpO2 94%   BMI 44.28 kg/m   Physical Exam  Constitutional: She is oriented to person, place, and time. She appears well-developed and well-nourished. No distress.  Morbidly obese  HENT:  Head: Normocephalic and atraumatic.  Cardiovascular: Normal rate, regular rhythm and normal heart sounds.   Pulmonary/Chest: Effort normal and breath sounds normal. No respiratory distress. She has no wheezes.  Abdominal: Soft. Bowel sounds are normal. There is no tenderness.  Musculoskeletal: She exhibits edema.  1+ pitting edema bilaterally to the ankles  Neurological: She is alert and oriented to person, place, and time.  Skin: Skin is warm and dry.  Psychiatric: She has a normal mood and affect.  Nursing note and vitals reviewed.    ED Treatments / Results  DIAGNOSTIC STUDIES:  Oxygen Saturation is 95% on RA, adequate by my interpretation.    COORDINATION OF CARE:  1:23 AM Discussed treatment plan with pt at bedside and pt agreed to plan.   Labs (all labs ordered are listed, but only abnormal results are displayed) Labs Reviewed  BASIC METABOLIC PANEL - Abnormal; Notable for the following:       Result Value   Glucose, Bld 124 (*)    GFR calc non Af Amer 57 (*)    All other components  within normal limits  CBC - Abnormal; Notable for the following:    RBC 3.74 (*)    Hemoglobin 11.7 (*)    All other components within normal limits  I-STAT TROPOININ, ED    EKG  EKG Interpretation  Date/Time:  Sunday November 30 2016 22:34:14 EDT Ventricular Rate:  89 PR Interval:    QRS Duration: 84 QT Interval:  392 QTC Calculation: 476 R Axis:   54 Text Interpretation:  Atrial fibrillation Low voltage QRS Nonspecific T wave abnormality Abnormal ECG Confirmed by Dina Rich  MD, Zoriana Oats ([id number]) on 12/01/2016 12:21:54 AM       Radiology Dg Chest 2 View  Result Date: 11/30/2016 CLINICAL DATA:  Dyspnea, history of CHF EXAM: CHEST  2 VIEW COMPARISON:  10/16/2016 FINDINGS: Stable cardiomegaly. No aortic aneurysm. No pulmonary consolidation. Mild central pulmonary vascular congestion. Bibasilar atelectasis. No acute osseous abnormality. IMPRESSION: Cardiomegaly with mild center pulmonary vascular congestion. Electronically Signed   By: Ashley Royalty M.D.   On: 11/30/2016 22:54    Procedures Procedures (including critical care time)  Medications Ordered in ED Medications  furosemide (LASIX) injection 40 mg (40 mg Intravenous Given 12/01/16 0137)     Initial Impression / Assessment and Plan / ED Course  I have reviewed the triage vital signs and the nursing notes.  Pertinent labs & imaging results that were available during my care of the patient were reviewed by me and considered in my medical decision making (see chart for details).     Patient presents with worsening shortness of breath over the last week. She is nontoxic-appearing. Vital signs reassuring. O2 sats 94% on room air. She is in no respiratory distress. Pulmonary exam is reassuring. She does have some evidence of lower extremity edema. Chest x-ray shows some mild vascular congestion. Suspect mild volume overload. Patient was given 40 mg of IV Lasix. She was able to ambulate and maintain pulse ox 91-95% without any acute  distress.  Will treat by increasing Lasix to 40 mg twice a day for the next 3 days. She will need close follow-up with her primary physician and/or cardiologist at that time for repeat evaluation of volume status. She was given strict return precautions.  After history, exam, and medical workup I feel the patient has been appropriately medically screened and is safe for discharge home. Pertinent diagnoses were discussed with the patient. Patient was given return precautions.   Final Clinical Impressions(s) / ED Diagnoses   Final diagnoses:  Acute on chronic diastolic heart failure (HCC)  Shortness of breath    New Prescriptions New Prescriptions   FUROSEMIDE (LASIX) 40 MG TABLET    Take 1 tablet (40 mg total) by mouth 2 (two) times daily.   I personally performed the services described in this documentation, which was scribed in my presence. The recorded information has been reviewed and is accurate.    Merryl Hacker, MD 12/01/16 [phone]

## 2016-12-03 ENCOUNTER — Other Ambulatory Visit: Payer: Self-pay | Admitting: Internal Medicine

## 2016-12-04 ENCOUNTER — Inpatient Hospital Stay: Payer: Medicare Other | Admitting: Internal Medicine

## 2016-12-24 ENCOUNTER — Encounter: Payer: Self-pay | Admitting: *Deleted

## 2017-01-13 ENCOUNTER — Ambulatory Visit: Payer: Medicare Other | Admitting: Cardiology

## 2017-01-13 NOTE — Progress Notes (Deleted)
Cardiology Office Note   Date:  01/13/2017   ID:  [name] Case, DOB 1932-06-12, MRN [id number]  PCP:  Hoyt Koch, MD  Cardiologist:   Candee Furbish, MD       History of Present Illness: [name] Case is a 83 y.o. female who presents for follow-up visit for atrial fibrillation- hospitalization 01/12/16. She was having chronic shortness of breath seen by cardiology as well as pulmonology. No fever. In the emergency room was found to be in atrial fibrillation with rapid ventricular response. She was given Lasix.   At one point in 2016, we ordered an event monitor and after 30 days, no atrial fibrillation was discovered. She desired to come off of her Eliquis at that point. Subsequently however she had shown [place] once again atrial fibrillation, admission on 01/08/16.  Dr. Caryl Comes with EP saw her in consultation. Echocardiogram on 01/09/16 showed EF of 50-55% with dilated left atrium 47 mm. Her risk score was at least 4. She was placed on Cardizem 60 mg every 8 hours and metoprolol succinate 25 mg daily. He recommended that she be discharged on Eliquis 5 mg twice a day. She was going to follow-up with me to discuss cardioversion.  Today, she is feeling much better. Less short of breath. She still has some sluggishness and at times can feel her heart beating funny in the early morning hours. Sometimes she drinks some water in this helps. Her granddaughter, [name] Case was here with her at prior visit. She has been helping with her 4 children. We did not pursue cardioversion at that time given her lack of symptoms with well rate controlled atrial fibrillation (low likelihood of NSR maintenance).  Recent PFTs in August 2016 showed minimal obstructive airways disease with , minimal diffusion defect.    Past Medical History:  Diagnosis Date  . Allergy   . Asthma   . Atrial fibrillation ([name])   . Bronchitis   . CHF (congestive heart failure) ([place])   . Diabetes mellitus without complication  ([place])   . GERD (gastroesophageal reflux disease)   . Hyperlipidemia   . Hypertension   . Thyroid disease     Past Surgical History:  Procedure Laterality Date  . ABDOMINAL HYSTERECTOMY    . APPENDECTOMY    . CHOLECYSTECTOMY    . TOTAL KNEE ARTHROPLASTY       Current Outpatient Prescriptions  Medication Sig Dispense Refill  . apixaban (ELIQUIS) 5 MG TABS tablet Take 1 tablet (5 mg total) by mouth 2 (two) times daily. 180 tablet 3  . atorvastatin (LIPITOR) 20 MG tablet TAKE 1 TABLET BY MOUTH DAILY AT 6PM 90 tablet 0  . budesonide-formoterol (SYMBICORT) 160-4.5 MCG/ACT inhaler Inhale 2 puffs into the lungs 2 (two) times daily as needed (Shortness of breath).    . diltiazem (CARDIZEM CD) 180 MG 24 hr capsule TAKE 1 CAPSULE BY MOUTH DAILY 90 capsule 1  . furosemide (LASIX) 40 MG tablet Take 1 tablet (40 mg total) by mouth 2 (two) times daily. 6 tablet 0  . furosemide (LASIX) 80 MG tablet TAKE 1/2 TABLET BY MOUTH DAILY 30 tablet 1  . irbesartan (AVAPRO) 150 MG tablet Take 0.5 tablets (75 mg total) by mouth daily. 30 tablet 11  . levothyroxine (SYNTHROID, LEVOTHROID) 100 MCG tablet TAKE 1 TABLET BY MOUTH ONCE DAILY 90 tablet 3  . metoprolol succinate (TOPROL-XL) 50 MG 24 hr tablet Take 1 tablet (50 mg total) by mouth daily. Take with or immediately following a  meal. 90 tablet 3  . polyethylene glycol (MIRALAX / GLYCOLAX) packet Take 17 g by mouth daily as needed for mild constipation.    . potassium chloride (K-DUR) 10 MEQ tablet Take 1 tablet (10 mEq total) by mouth daily. 90 tablet 3   No current facility-administered medications for this visit.     Allergies:   Codeine    Social History:  The patient  reports that she has never smoked. She has never used smokeless tobacco. She reports that she does not drink alcohol or use drugs.   Family History:  The patient's family history includes Colon cancer in her mother; Heart disease in her mother.    ROS:  Please see the history of  present illness.   Otherwise, review of systems are positive for shortness of breath snoring.   All other systems are reviewed and negative.    PHYSICAL EXAM: VS:  LMP  (LMP Unknown)  , BMI There is no height or weight on file to calculate BMI. GEN: Well nourished, well developed, in no acute distress  HEENT: normal  Neck: no JVD, carotid bruits, or masses, oxygen Cardiac: Irreg irreg, mildly tachy; no murmurs, rubs, or gallops,no edema  Respiratory:  clear to auscultation bilaterally, normal work of breathing GI: soft, nontender, nondistended, + BS obese MS: no deformity or atrophy  Skin: warm and dry, no rash Neuro:  Strength and sensation are intact Psych: euthymic mood, full affect   EKG:  EKG is not ordered today. Prior EKG shows atrial fibrillation with rapid ventricular response from early May 2017 hospitalization, personally viewed   Recent Labs: 02/29/2016: ALT 17; Pro B Natriuretic peptide (BNP) 174.0; TSH 1.17 06/16/2016: Brain Natriuretic Peptide 165.1 11/30/2016: BUN 20; Creatinine, Ser 0.91; Hemoglobin 11.7; Platelets 261; Potassium 4.0; Sodium 142    Lipid Panel    Component Value Date/Time   CHOL 209 (H) 06/08/2015 1126   TRIG 147.0 06/08/2015 1126   HDL 48.80 06/08/2015 1126   CHOLHDL 4 06/08/2015 1126   VLDL 29.4 06/08/2015 1126   LDLCALC 131 (H) 06/08/2015 1126   LDLDIRECT 188.6 07/19/2007 0000      Wt Readings from Last 3 Encounters:  11/30/16 232 lb 7 oz (105.4 kg)  10/16/16 226 lb (102.5 kg)  07/28/16 222 lb 3.2 oz (100.8 kg)    Echocardiogram: 01/08/15-EF 55%, no other abnormalities.  Other studies Reviewed: Additional studies/ records that were reviewed today include: Hospital records reviewed, lab work reviewed, echocardiogram reviewed. Review of the above records demonstrates: As above   ASSESSMENT AND PLAN:  Permanent atrial fibrillation  - Rate control strategy. She is on both metoprolol as well as diltiazem.  - I will increase her  metoprolol to 50 mg once a day. Extended release.  - On auscultation today her heart rate was around 100 bpm  - Maintenance of sinus rhythm would be difficult at this point.  Dyspnea  - Previous BNP was reassuring, 165. Echocardiogram showed normal ejection fraction.  - mild PFT abnormalities consistent with COPD. Continue to encourage conditioning, exercise, weight loss.  Chronic anticoagulation  - No evidence of bleeding. Doing well.  - Brief, 1 day mild hematuria, resolved spontaneously.  - Creatinine 0.97, hemoglobin 13.1 at last check.  Morbid obesity-continue to encourage weight loss.  She also inquired about getting a flu shot today-we will check into this.  Signed, Candee Furbish, MD  01/13/2017 6:43 AM    [facility] [address], [place], [place]  [address] Phone: [phone]; Fax: [phone]

## 2017-02-03 ENCOUNTER — Encounter: Payer: Self-pay | Admitting: Cardiology

## 2017-02-04 ENCOUNTER — Emergency Department (HOSPITAL_COMMUNITY)
Admission: EM | Admit: 2017-02-04 | Discharge: 2017-02-05 | Disposition: A | Discharge status: Home / Self Care | Payer: Medicare Other | Attending: Emergency Medicine | Admitting: Emergency Medicine

## 2017-02-04 ENCOUNTER — Encounter (HOSPITAL_COMMUNITY): Payer: Self-pay | Admitting: Emergency Medicine

## 2017-02-04 ENCOUNTER — Emergency Department (HOSPITAL_COMMUNITY): Payer: Medicare Other

## 2017-02-04 DIAGNOSIS — J45909 Unspecified asthma, uncomplicated: Secondary | ICD-10-CM | POA: Diagnosis not present

## 2017-02-04 DIAGNOSIS — Z7901 Long term (current) use of anticoagulants: Secondary | ICD-10-CM | POA: Diagnosis not present

## 2017-02-04 DIAGNOSIS — I110 Hypertensive heart disease with heart failure: Secondary | ICD-10-CM | POA: Insufficient documentation

## 2017-02-04 DIAGNOSIS — E119 Type 2 diabetes mellitus without complications: Secondary | ICD-10-CM | POA: Insufficient documentation

## 2017-02-04 DIAGNOSIS — R0602 Shortness of breath: Secondary | ICD-10-CM | POA: Insufficient documentation

## 2017-02-04 DIAGNOSIS — I509 Heart failure, unspecified: Secondary | ICD-10-CM | POA: Insufficient documentation

## 2017-02-04 LAB — CBC
HEMATOCRIT: 39.7 % (ref 36.0–46.0)
Hemoglobin: 12.5 g/dL (ref 12.0–15.0)
MCH: 30.9 pg (ref 26.0–34.0)
MCHC: 31.5 g/dL (ref 30.0–36.0)
MCV: 98.3 fL (ref 78.0–100.0)
PLATELETS: 250 10*3/uL (ref 150–400)
RBC: 4.04 MIL/uL (ref 3.87–5.11)
RDW: 13.8 % (ref 11.5–15.5)
WBC: 8 10*3/uL (ref 4.0–10.5)

## 2017-02-04 LAB — BASIC METABOLIC PANEL
Anion gap: 7 (ref 5–15)
BUN: 17 mg/dL (ref 6–20)
CO2: 31 mmol/L (ref 22–32)
Calcium: 9.6 mg/dL (ref 8.9–10.3)
Chloride: 100 mmol/L — ABNORMAL LOW (ref 101–111)
Creatinine, Ser: 0.83 mg/dL (ref 0.44–1.00)
GFR calc Af Amer: >60 mL/min (ref >60)
Glucose, Bld: 150 mg/dL — ABNORMAL HIGH (ref 65–99)
POTASSIUM: 3.8 mmol/L (ref 3.5–5.1)
SODIUM: 138 mmol/L (ref 135–145)

## 2017-02-04 LAB — I-STAT TROPONIN, ED: TROPONIN I, POC: 0 ng/mL (ref 0.00–0.08)

## 2017-02-04 LAB — BRAIN NATRIURETIC PEPTIDE: B NATRIURETIC PEPTIDE 5: 189.3 pg/mL — AB (ref 0.0–100.0)

## 2017-02-04 NOTE — ED Triage Notes (Addendum)
Pt presents to ED for assessment of shortness of breath x 3 days, bilateral leg swelling, and some redness to the right leg.  Patient c/o increase in SOB with exertion.  Patient has hx of chf.  Pt also with a hx of afib, currently at a rate of approx 115.

## 2017-02-05 MED ORDER — FUROSEMIDE 10 MG/ML IJ SOLN
40.0000 mg | Freq: Once | INTRAMUSCULAR | Status: AC
  Administered 2017-02-05: 40 mg via INTRAVENOUS
  Filled 2017-02-05: qty 4

## 2017-02-05 NOTE — Discharge Instructions (Signed)
Increase your Lasix to 40 mg twice daily for the next 5 days.  Take your potassium supplement daily while taking this increased dose of Lasix.  Follow-up with your cardiologist as scheduled, and return to the emergency department if your symptoms worsen or change.

## 2017-02-05 NOTE — ED Notes (Signed)
Provider at bedside

## 2017-02-05 NOTE — ED Provider Notes (Signed)
[facility] DEPT Provider Note   CSN: [id number] Arrival date & time: 02/04/17  2044  By signing my name below, I, Collene Leyden, attest that this documentation has been prepared under the direction and in the presence of Veryl Speak, MD. Electronically Signed: Collene Leyden, Scribe. 02/05/17. 12:54 AM.  History   Chief Complaint Chief Complaint  Patient presents with  . Shortness of Breath   HPI Comments: [name] Case is a 83 y.o. female with a history of atrial fibrillation on eliquis, CHF, DM2, GERD, HLD, and chronic respiratory failure with hypercapnia, who presents to the Emergency Department complaining of sudden-onset, intermittent shortness of breath that began 3 days ago. Patient states initially she began having bilateral leg swelling/pain that began two weeks ago, has now developed shortness of breath. Patient denies any chest pain or diaphoresis. Patient states she has been complaint with her lasix, but states she only takes half a tablet of 80 mg lasix. Patient reports associated reflux and right leg redness. No modifying or aggravating factors indicated. Patient denies any fever, chills, abdominal pain, nausea, or vomiting.   The history is provided by the patient. No language interpreter was used.    Past Medical History:  Diagnosis Date  . Allergy   . Asthma   . Atrial fibrillation ([place])   . Bronchitis   . CHF (congestive heart failure) ([place])   . Diabetes mellitus without complication ([place])   . GERD (gastroesophageal reflux disease)   . Hyperlipidemia   . Hypertension   . Thyroid disease     Patient Active Problem List   Diagnosis Date Noted  . Lichen planus 43/15/4008  . Acute respiratory failure with hypoxia ([place])   . Obesity hypoventilation syndrome ([place])   . Hypokalemia   . Acute on chronic congestive heart failure ([place]) 01/09/2016  . Atrial fibrillation with RVR ([place]) 01/09/2016  . Dyspnea 05/28/2015  . History of snoring 05/20/2015  . Chronic  respiratory failure with hypercapnia ([place])   . Type 2 diabetes mellitus without complication ([place])   . Atrial fibrillation ([place]) 01/06/2015  . Hypothyroidism 03/09/2007  . Hyperlipidemia 03/09/2007  . Morbid obesity ([place]) 03/09/2007  . Essential hypertension 03/09/2007  . ALLERGIC RHINITIS 03/09/2007  . Asthma 03/09/2007  . GERD 03/09/2007  . INCONTINENCE 03/09/2007    Past Surgical History:  Procedure Laterality Date  . ABDOMINAL HYSTERECTOMY    . APPENDECTOMY    . CHOLECYSTECTOMY    . TOTAL KNEE ARTHROPLASTY      OB History    No data available       Home Medications    Prior to Admission medications   Medication Sig Start Date End Date Taking? Authorizing Provider  apixaban (ELIQUIS) 5 MG TABS tablet Take 1 tablet (5 mg total) by mouth 2 (two) times daily. 07/28/16  Yes Jerline Pain, MD  atorvastatin (LIPITOR) 20 MG tablet TAKE 1 TABLET BY MOUTH DAILY AT 6PM 11/12/16  Yes Hoyt Koch, MD  budesonide-formoterol [facility]) 160-4.5 MCG/ACT inhaler Inhale 2 puffs into the lungs 2 (two) times daily as needed (Shortness of breath).   Yes [provider]  diltiazem (CARDIZEM CD) 180 MG 24 hr capsule TAKE 1 CAPSULE BY MOUTH DAILY 09/08/16  Yes Hoyt Koch, MD  furosemide (LASIX) 80 MG tablet TAKE 1/2 TABLET BY MOUTH DAILY 11/17/16  Yes Hoyt Koch, MD  irbesartan (AVAPRO) 150 MG tablet Take 0.5 tablets (75 mg total) by mouth daily. 01/17/16  Yes Plotnikov, Evie Lacks, MD  levothyroxine (  SYNTHROID, LEVOTHROID) 100 MCG tablet TAKE 1 TABLET BY MOUTH ONCE DAILY 12/03/16  Yes Hoyt Koch, MD  metoprolol succinate (TOPROL-XL) 50 MG 24 hr tablet Take 1 tablet (50 mg total) by mouth daily. Take with or immediately following a meal. 07/28/16 02/04/17 Yes Skains, Thana Farr, MD  polyethylene glycol (MIRALAX / GLYCOLAX) packet Take 17 g by mouth daily as needed for mild constipation.   Yes [provider]  potassium chloride (K-DUR) 10 MEQ tablet  Take 1 tablet (10 mEq total) by mouth daily. Patient taking differently: Take 10 mEq by mouth 3 (three) times a week.  07/28/16  Yes Jerline Pain, MD    Family History Family History  Problem Relation Age of Onset  . Heart disease Mother   . Colon cancer Mother     Social History Social History  Substance Use Topics  . Smoking status: Never Smoker  . Smokeless tobacco: Never Used  . Alcohol use No     Allergies   Codeine   Review of Systems Review of Systems  Constitutional: Negative for chills and fever.  Respiratory: Positive for shortness of breath.   Cardiovascular: Positive for leg swelling. Negative for chest pain.  Gastrointestinal: Negative for abdominal pain, nausea and vomiting.  Musculoskeletal: Positive for arthralgias (bilateral leg pain).  Skin: Positive for color change (erythematous right leg).  All other systems reviewed and are negative.    Physical Exam Updated Vital Signs BP (!) 148/103   Pulse 77   Temp 98.7 F (37.1 C) (Oral)   Resp 20   LMP  (LMP Unknown)   SpO2 95%   Physical Exam  Constitutional: She is oriented to person, place, and time. She appears well-developed and well-nourished.  HENT:  Head: Normocephalic and atraumatic.  Mouth/Throat: Oropharynx is clear and moist.  Eyes: Pupils are equal, round, and reactive to light.  Neck: Normal range of motion. Neck supple.  Cardiovascular: Normal rate and regular rhythm.   No murmur heard. Pulmonary/Chest: Effort normal. No respiratory distress. She has no wheezes. She has rales.  Slight rales in the bases bilaterally.   Abdominal: Soft. Bowel sounds are normal. She exhibits no distension. There is no tenderness.  Musculoskeletal: Normal range of motion. She exhibits edema.  2-3+ pitting edema of both lower extremities.   Neurological: She is alert and oriented to person, place, and time.  Skin: Skin is warm and dry.  Psychiatric: She has a normal mood and affect.  Nursing note  and vitals reviewed.    ED Treatments / Results  DIAGNOSTIC STUDIES: Oxygen Saturation is 95% on RA, adequate by my interpretation.    COORDINATION OF CARE: 12:54 AM Discussed treatment plan with pt at bedside and pt agreed to plan, which includes lasix.   Labs (all labs ordered are listed, but only abnormal results are displayed) Labs Reviewed  BASIC METABOLIC PANEL - Abnormal; Notable for the following:       Result Value   Chloride 100 (*)    Glucose, Bld 150 (*)    All other components within normal limits  BRAIN NATRIURETIC PEPTIDE - Abnormal; Notable for the following:    B Natriuretic Peptide 189.3 (*)    All other components within normal limits  CBC  I-STAT TROPOININ, ED    EKG  EKG Interpretation None       Radiology Dg Chest 2 View  Result Date: 02/04/2017 CLINICAL DATA:  Shortness of breath. Bilateral lower leg swelling. Stroke CHF. EXAM: CHEST  2  VIEW COMPARISON:  Radiographs 11/30/2016, 10/16/2016 FINDINGS: Stable cardiomegaly and mediastinal contours. Mild vascular congestion, no alveolar edema. No pleural fluid, consolidation or pneumothorax. No acute osseous abnormalities. IMPRESSION: Stable cardiomegaly and vascular congestion. Electronically Signed   By: Jeb Levering M.D.   On: 02/04/2017 21:47    Procedures Procedures (including critical care time)  Medications Ordered in ED Medications - No data to display   Initial Impression / Assessment and Plan / ED Course  I have reviewed the triage vital signs and the nursing notes.  Pertinent labs & imaging results that were available during my care of the patient were reviewed by me and considered in my medical decision making (see chart for details).  Patient presents here with complaints of shortness of breath worsening over the past several days. She has a history of congestive heart failure and also reports increased swelling of her legs. She has a slight elevation of her BNP above her baseline  and chest x-ray shows cardiomegaly and vascular congestion. I suspect a mild exacerbation of CHF. She was given IV Lasix here in the emergency department, his diuresis, and is now feeling better. She is not hypoxic and her workup was otherwise unremarkable. She will be discharged with an increased dose of Lasix for the next 5 days and follow-up with her cardiologist as scheduled in the next 2 weeks. She is to return if she worsens.  Final Clinical Impressions(s) / ED Diagnoses   Final diagnoses:  None    New Prescriptions New Prescriptions   No medications on file   I personally performed the services described in this documentation, which was scribed in my presence. The recorded information has been reviewed and is accurate.        Veryl Speak, MD 02/05/17 [phone]

## 2017-02-21 ENCOUNTER — Emergency Department (HOSPITAL_COMMUNITY): Payer: Medicare Other

## 2017-02-21 ENCOUNTER — Emergency Department (HOSPITAL_COMMUNITY)
Admission: EM | Admit: 2017-02-21 | Discharge: 2017-02-21 | Disposition: A | Discharge status: Home / Self Care | Payer: Medicare Other | Attending: Emergency Medicine | Admitting: Emergency Medicine

## 2017-02-21 ENCOUNTER — Encounter (HOSPITAL_COMMUNITY): Payer: Self-pay

## 2017-02-21 DIAGNOSIS — R0602 Shortness of breath: Secondary | ICD-10-CM | POA: Insufficient documentation

## 2017-02-21 DIAGNOSIS — M7989 Other specified soft tissue disorders: Secondary | ICD-10-CM

## 2017-02-21 DIAGNOSIS — R609 Edema, unspecified: Secondary | ICD-10-CM | POA: Diagnosis not present

## 2017-02-21 DIAGNOSIS — R600 Localized edema: Secondary | ICD-10-CM | POA: Diagnosis not present

## 2017-02-21 LAB — BASIC METABOLIC PANEL
Anion gap: 8 (ref 5–15)
BUN: 18 mg/dL (ref 6–20)
CO2: 29 mmol/L (ref 22–32)
CREATININE: 1.02 mg/dL — AB (ref 0.44–1.00)
Calcium: 9.2 mg/dL (ref 8.9–10.3)
Chloride: 101 mmol/L (ref 101–111)
GFR, EST AFRICAN AMERICAN: 57 mL/min — AB (ref >60)
GFR, EST NON AFRICAN AMERICAN: 49 mL/min — AB (ref >60)
Glucose, Bld: 101 mg/dL — ABNORMAL HIGH (ref 65–99)
Potassium: 3.9 mmol/L (ref 3.5–5.1)
SODIUM: 138 mmol/L (ref 135–145)

## 2017-02-21 LAB — I-STAT TROPONIN, ED: TROPONIN I, POC: 0 ng/mL (ref 0.00–0.08)

## 2017-02-21 LAB — CBC
HCT: 39.4 % (ref 36.0–46.0)
Hemoglobin: 12.8 g/dL (ref 12.0–15.0)
MCH: 31.7 pg (ref 26.0–34.0)
MCHC: 32.5 g/dL (ref 30.0–36.0)
MCV: 97.5 fL (ref 78.0–100.0)
PLATELETS: 222 10*3/uL (ref 150–400)
RBC: 4.04 MIL/uL (ref 3.87–5.11)
RDW: 13.9 % (ref 11.5–15.5)
WBC: 8.3 10*3/uL (ref 4.0–10.5)

## 2017-02-21 LAB — BRAIN NATRIURETIC PEPTIDE: B NATRIURETIC PEPTIDE 5: 217.4 pg/mL — AB (ref 0.0–100.0)

## 2017-02-21 MED ORDER — CEPHALEXIN 500 MG PO CAPS: 500.0000 mg | ORAL_CAPSULE | Freq: Four times a day (QID) | ORAL | 0 refills | Status: DC

## 2017-02-21 MED ORDER — FUROSEMIDE 20 MG PO TABS
40.0000 mg | ORAL_TABLET | Freq: Once | ORAL | Status: AC
  Administered 2017-02-21: 40 mg via ORAL
  Filled 2017-02-21: qty 2

## 2017-02-21 NOTE — ED Triage Notes (Signed)
Pt complaining of SOB and R leg swelling. Pt states hx of afib. Pt states takes Eliquis for afib. Pt with redness and swelling to R leg.

## 2017-02-21 NOTE — Discharge Instructions (Signed)
1. Medications: Keflex - complete entire course, continue with Lasix 2 times per day until swelling improves, usual home medications 2. Treatment: rest, drink plenty of fluids, elevate your legs 3. Follow Up: Please return tomorrow morning for ultrasound of your right leg, per the instructions at discharge followup with your primary doctor in 2 days for discussion of your diagnoses and further evaluation after today's visit; Please return to the ER for worsening shortness of breath, worsening redness, development of fevers, worsening fatigue, chest pain or other concerns

## 2017-02-21 NOTE — ED Provider Notes (Signed)
[facility] DEPT Provider Note   CSN: [id number] Arrival date & time: 02/21/17  1922     History   Chief Complaint Chief Complaint  Patient presents with  . Shortness of Breath  . Leg Swelling    HPI [name] Case is a 83 y.o. female with a hx of a-fib (anticoagulated on Eliquis), CHF, HTN, HLD, NIDDM presents to the Emergency Department complaining of gradual, persistent, progressively worsening Swelling and slight erythema of the right leg onset 2 days ago.  Patient reports she's had gradually increasing shortness of breath and fatigue over the last 2 weeks decreasing her activity and thus increasing her time in bed. She reports taking Lasix 40 mg daily and increasing this dose to twice daily when her legs began to swell. Patient reports she took a second dose of Lasix last night but has not been previously doing this. She reports that her right leg itches but is not painful. She denies measurable subjective fevers at home, cough or congestion. She denies pink frothy sputum. Patient reports previously being hospitalized for her CHF but does not feel as if she is significantly more short of breath today than usual. Nothing seems to make her symptoms better or worse. She denies known injury or open wounds to the right leg. She reports concern about DVT but also reports compliance with her liquids and no missed doses. Patient denies abdominal pain, nausea, vomiting, chest pain.  The history is provided by the patient and medical records. No language interpreter was used.    Past Medical History:  Diagnosis Date  . Allergy   . Asthma   . Atrial fibrillation ([place])   . Bronchitis   . CHF (congestive heart failure) ([place])   . Diabetes mellitus without complication ([place])   . GERD (gastroesophageal reflux disease)   . Hyperlipidemia   . Hypertension   . Thyroid disease     Patient Active Problem List   Diagnosis Date Noted  . Lichen planus 89/21/1941  . Acute respiratory failure  with hypoxia ([place])   . Obesity hypoventilation syndrome ([place])   . Hypokalemia   . Acute on chronic congestive heart failure ([place]) 01/09/2016  . Atrial fibrillation with RVR ([place]) 01/09/2016  . Dyspnea 05/28/2015  . History of snoring 05/20/2015  . Chronic respiratory failure with hypercapnia ([place])   . Type 2 diabetes mellitus without complication ([place])   . Atrial fibrillation ([place]) 01/06/2015  . Hypothyroidism 03/09/2007  . Hyperlipidemia 03/09/2007  . Morbid obesity ([place]) 03/09/2007  . Essential hypertension 03/09/2007  . ALLERGIC RHINITIS 03/09/2007  . Asthma 03/09/2007  . GERD 03/09/2007  . INCONTINENCE 03/09/2007    Past Surgical History:  Procedure Laterality Date  . ABDOMINAL HYSTERECTOMY    . APPENDECTOMY    . CHOLECYSTECTOMY    . TOTAL KNEE ARTHROPLASTY      OB History    No data available       Home Medications    Prior to Admission medications   Medication Sig Start Date End Date Taking? Authorizing Provider  apixaban (ELIQUIS) 5 MG TABS tablet Take 1 tablet (5 mg total) by mouth 2 (two) times daily. 07/28/16   Jerline Pain, MD  atorvastatin (LIPITOR) 20 MG tablet TAKE 1 TABLET BY MOUTH DAILY AT 6PM 11/12/16   Hoyt Koch, MD  budesonide-formoterol [facility]) 160-4.5 MCG/ACT inhaler Inhale 2 puffs into the lungs 2 (two) times daily as needed (Shortness of breath).    [provider]  cephALEXin (KEFLEX) 500 MG  capsule Take 1 capsule (500 mg total) by mouth 4 (four) times daily. 02/21/17   Darold Miley, Jarrett Soho, PA-C  diltiazem (CARDIZEM CD) 180 MG 24 hr capsule TAKE 1 CAPSULE BY MOUTH DAILY 09/08/16   Hoyt Koch, MD  furosemide (LASIX) 80 MG tablet TAKE 1/2 TABLET BY MOUTH DAILY 11/17/16   Hoyt Koch, MD  irbesartan (AVAPRO) 150 MG tablet Take 0.5 tablets (75 mg total) by mouth daily. 01/17/16   Plotnikov, Evie Lacks, MD  levothyroxine (SYNTHROID, LEVOTHROID) 100 MCG tablet TAKE 1 TABLET BY MOUTH ONCE DAILY 12/03/16   Hoyt Koch, MD  metoprolol succinate (TOPROL-XL) 50 MG 24 hr tablet Take 1 tablet (50 mg total) by mouth daily. Take with or immediately following a meal. 07/28/16 02/04/17  Jerline Pain, MD  polyethylene glycol (MIRALAX / GLYCOLAX) packet Take 17 g by mouth daily as needed for mild constipation.    [provider]  potassium chloride (K-DUR) 10 MEQ tablet Take 1 tablet (10 mEq total) by mouth daily. Patient taking differently: Take 10 mEq by mouth 3 (three) times a week.  07/28/16   Jerline Pain, MD    Family History Family History  Problem Relation Age of Onset  . Heart disease Mother   . Colon cancer Mother     Social History Social History  Substance Use Topics  . Smoking status: Never Smoker  . Smokeless tobacco: Never Used  . Alcohol use No     Allergies   Codeine   Review of Systems Review of Systems  Constitutional: Positive for activity change and fatigue. Negative for appetite change, diaphoresis, fever and unexpected weight change.  HENT: Negative for congestion and mouth sores.   Eyes: Negative for visual disturbance.  Respiratory: Positive for shortness of breath. Negative for cough, chest tightness and wheezing.   Cardiovascular: Positive for leg swelling. Negative for chest pain.  Gastrointestinal: Negative for abdominal pain, constipation, diarrhea, nausea and vomiting.  Endocrine: Negative for polydipsia, polyphagia and polyuria.  Genitourinary: Negative for dysuria, frequency, hematuria and urgency.  Musculoskeletal: Negative for back pain and neck stiffness.  Skin: Negative for rash.  Neurological: Negative for syncope, light-headedness and headaches.  Hematological: Does not bruise/bleed easily.  Psychiatric/Behavioral: Negative for sleep disturbance. The patient is not nervous/anxious.      Physical Exam Updated Vital Signs BP 117/60   Pulse 89   Temp 98.2 F (36.8 C)   Resp 16   LMP  (LMP Unknown)   SpO2 93%   Physical Exam    Constitutional: She appears well-developed and well-nourished. No distress.  Awake, alert, nontoxic appearance  HENT:  Head: Normocephalic and atraumatic.  Mouth/Throat: Oropharynx is clear and moist. No oropharyngeal exudate.  Eyes: Conjunctivae are normal. No scleral icterus.  Neck: Normal range of motion. Neck supple.  Cardiovascular: Normal rate and intact distal pulses.  An irregularly irregular rhythm present.  Pulses:      Radial pulses are 2+ on the right side, and 2+ on the left side.       Dorsalis pedis pulses are 2+ on the right side, and 2+ on the left side.  Rate controlled A. fib on the monitor  Pulmonary/Chest: Effort normal and breath sounds normal. No respiratory distress. She has no wheezes. She has no rhonchi. She has no rales.  Equal chest expansion Breath sounds are clear and equal without rhonchi or rales  Abdominal: Soft. Bowel sounds are normal. She exhibits no mass. There is no tenderness. There is no  rebound and no guarding.  Musculoskeletal: Normal range of motion. She exhibits edema (Peripheral edema of the bilateral lower legs right slightly greater than left).  2+ pitting edema of the right leg, 1+ pitting edema of the left leg Right leg with very mild erythema, no significantly increased warmth. No additional skin changes.  Small area of skin breakdown with weeping on the posterior right lower leg, no streaking  Neurological: She is alert.  Speech is clear and goal oriented Moves extremities without ataxia  Skin: Skin is warm and dry. She is not diaphoretic.  Psychiatric: She has a normal mood and affect.  Nursing note and vitals reviewed.    ED Treatments / Results  Labs (all labs ordered are listed, but only abnormal results are displayed) Labs Reviewed  BASIC METABOLIC PANEL - Abnormal; Notable for the following:       Result Value   Glucose, Bld 101 (*)    Creatinine, Ser 1.02 (*)    GFR calc non Af Amer 49 (*)    GFR calc Af Amer 57 (*)     All other components within normal limits  BRAIN NATRIURETIC PEPTIDE - Abnormal; Notable for the following:    B Natriuretic Peptide 217.4 (*)    All other components within normal limits  CBC  I-STAT TROPOININ, ED    EKG  EKG Interpretation  Date/Time:  Saturday February 21 2017 19:28:58 EDT Ventricular Rate:  84 PR Interval:    QRS Duration: 86 QT Interval:  358 QTC Calculation: 423 R Axis:   29 Text Interpretation:  Atrial fibrillation Low voltage QRS Cannot rule out Anterior infarct , age undetermined Abnormal ECG No significant change since last tracing Confirmed by Duffy Bruce [phone]) on 02/21/2017 10:05:40 PM       Radiology Dg Chest 2 View  Result Date: 02/21/2017 CLINICAL DATA:  Shortness of breath, leg swelling. EXAM: CHEST  2 VIEW COMPARISON:  Chest x-rays dated 02/04/2017 and 01/08/2016. FINDINGS: Cardiomegaly is stable. Overall cardiomediastinal silhouette is stable. Lungs are clear. No pleural effusion or pneumothorax seen. Osseous and soft tissue structures about the chest are unremarkable. IMPRESSION: No active cardiopulmonary disease. Stable cardiomegaly. No evidence of pneumonia or pulmonary edema at this time. Electronically Signed   By: Franki Cabot M.D.   On: 02/21/2017 21:31    Procedures Procedures (including critical care time)  Medications Ordered in ED Medications  furosemide (LASIX) tablet 40 mg (40 mg Oral Given 02/21/17 2210)     Initial Impression / Assessment and Plan / ED Course  I have reviewed the triage vital signs and the nursing notes.  Pertinent labs & imaging results that were available during my care of the patient were reviewed by me and considered in my medical decision making (see chart for details).     Patient presents with history of A. fib, chronic shortness of breath and CHF with increased swelling and itching of her right leg. No overt signs of cellulitis however patient is concerned about a small area on the posterior leg.  Will give short course of Keflex however this is likely due to her significant edema.  Patient is well-appearing. 7 elevated BNP but lungs are clear on clinical exam and chest x-ray is without evidence of pulmonary edema. Patient is without hypoxia. Her A. fib is rate controlled and she has had no missed doses of her anticoagulant. Highly doubt PE at this time. Patient is to continue with increased Lasix doses, return tomorrow morning for venous duplex study  and follow with her primary care in 2 days for further evaluation. Strict return precautions discussed. Patient is in agreement with the plan.  The patient was discussed with and seen by Dr. Ellender Hose who agrees with the treatment plan.   Final Clinical Impressions(s) / ED Diagnoses   Final diagnoses:  Peripheral edema  Shortness of breath  Leg swelling    New Prescriptions New Prescriptions   CEPHALEXIN (KEFLEX) 500 MG CAPSULE    Take 1 capsule (500 mg total) by mouth 4 (four) times daily.     Agape Hardiman, Gwenlyn Perking 02/21/17 2257    Duffy Bruce, MD 02/22/17 1225

## 2017-02-21 NOTE — ED Notes (Signed)
Pt stable, understands discharge instructions, and reasons for return.   

## 2017-02-22 ENCOUNTER — Ambulatory Visit (HOSPITAL_COMMUNITY)
Admission: RE | Admit: 2017-02-22 | Discharge: 2017-02-22 | Disposition: A | Discharge status: Home / Self Care | Payer: Medicare Other | Source: Ambulatory Visit | Attending: Emergency Medicine | Admitting: Emergency Medicine

## 2017-02-22 DIAGNOSIS — M7989 Other specified soft tissue disorders: Secondary | ICD-10-CM | POA: Diagnosis not present

## 2017-02-22 DIAGNOSIS — R600 Localized edema: Secondary | ICD-10-CM | POA: Insufficient documentation

## 2017-02-22 DIAGNOSIS — R609 Edema, unspecified: Secondary | ICD-10-CM

## 2017-02-22 NOTE — Progress Notes (Signed)
VASCULAR LAB PRELIMINARY  PRELIMINARY  PRELIMINARY  PRELIMINARY  Right lower extremity venous duplex completed.    Preliminary report:  There is no DVT or SVT noted in the right lower extremity. Interstitial fluid noted throughout calf.  Suvi Archuletta, RVT 02/22/2017, 9:05 AM

## 2017-02-23 ENCOUNTER — Ambulatory Visit: Payer: Medicare Other | Admitting: Cardiology

## 2017-02-23 ENCOUNTER — Encounter: Payer: Self-pay | Admitting: Family Medicine

## 2017-02-23 ENCOUNTER — Ambulatory Visit (INDEPENDENT_AMBULATORY_CARE_PROVIDER_SITE_OTHER): Payer: Medicare Other | Admitting: Family Medicine

## 2017-02-23 VITALS — BP 136/74 | HR 83 | Temp 98.0°F | Resp 14 | Ht 60.75 in | Wt 236.0 lb

## 2017-02-23 DIAGNOSIS — R040 Epistaxis: Secondary | ICD-10-CM | POA: Diagnosis not present

## 2017-02-23 DIAGNOSIS — I10 Essential (primary) hypertension: Secondary | ICD-10-CM

## 2017-02-23 DIAGNOSIS — I5033 Acute on chronic diastolic (congestive) heart failure: Secondary | ICD-10-CM | POA: Diagnosis not present

## 2017-02-23 NOTE — Patient Instructions (Addendum)
Humidify the air and / or use saline nasal spray  Take lasix 40 mg every morning, may take an additional dose in the early afternoon if swelling

## 2017-02-23 NOTE — Progress Notes (Signed)
Subjective:    Patient ID: [name] Case, female    DOB: 1932-06-13, 83 y.o.   MRN: [id number]  HPI This is an 83 yo female who presents today following ED visit 02/21/2017. She is accompanied by her daughter and granddaughter. She was taken in for peripheral edema. Labs and CXR unremarkable, she was given extra lasix. She had negative venous doppler of both legs. There was some concern for cellulitis and she was given a prescriptions for cephalexin which she has not filled. She reports that her edema has gone down to her baseline. Breathing is at baseline. She was able to do some household chores this morning but has to do them slowly and take frequent breaks.  Takes Eliquis, has had a couple of brief nose bleeds recently. Thinks air is dry, plans to start using humidifier.   Past Medical History:  Diagnosis Date  . Allergy   . Asthma   . Atrial fibrillation ([place])   . Bronchitis   . CHF (congestive heart failure) ([place])   . Diabetes mellitus without complication ([place])   . GERD (gastroesophageal reflux disease)   . Hyperlipidemia   . Hypertension   . Thyroid disease    Past Surgical History:  Procedure Laterality Date  . ABDOMINAL HYSTERECTOMY    . APPENDECTOMY    . CHOLECYSTECTOMY    . TOTAL KNEE ARTHROPLASTY     Family History  Problem Relation Age of Onset  . Heart disease Mother   . Colon cancer Mother    Social History  Substance Use Topics  . Smoking status: Never Smoker  . Smokeless tobacco: Never Used  . Alcohol use No    Review of Systems Per HPI    Objective:   Physical Exam  Constitutional: She is oriented to person, place, and time. She appears well-developed and well-nourished. No distress.  Morbidly obese.   HENT:  Head: Normocephalic and atraumatic.  Cardiovascular: Normal rate, regular rhythm and normal heart sounds.   Pulmonary/Chest: Effort normal and breath sounds normal.  Musculoskeletal: She exhibits edema (+1 edema lower extremities. ).    Neurological: She is alert and oriented to person, place, and time.  Skin: Skin is warm and dry. She is not diaphoretic.  No evidence of cellulitis on lower extremities.   Psychiatric: She has a normal mood and affect. Her behavior is normal. Judgment and thought content normal.  Vitals reviewed.   BP 136/74 (BP Location: Left Arm, Patient Position: Sitting, Cuff Size: Large)   Pulse 83   Temp 98 F (36.7 C) (Oral)   Resp 14   Ht 5' 0.75" (1.543 m)   Wt 236 lb (107 kg)   LMP  (LMP Unknown)   SpO2 98%   BMI 44.96 kg/m  Wt Readings from Last 3 Encounters:  02/23/17 236 lb (107 kg)  11/30/16 232 lb 7 oz (105.4 kg)  10/16/16 226 lb (102.5 kg)       Assessment & Plan:  1. Acute on chronic diastolic congestive heart failure ([place]) - edema resolved and she is at baseline with breathing and activity level - she was unclear on instructions for her furosemide, I have instructed her to take her regular 40 mg in the morning and if needed, can repeat x 1 by early afternoon - continue daily potassium supplementation  2. Essential hypertension - well controlled on current meds  3. Epistaxis - encouraged her to use saline nasal spray, humidifier  - she has appointment with cardiology upcoming, discussed staggering  her visits with Dr. Sharlet [place]ina and cardiology so she is seeing someone every 3 months   Clarene Reamer, FNP-BC  [facility] [place], [place]  02/25/2017 8:36 AM

## 2017-02-26 NOTE — Progress Notes (Signed)
Cardiology Office Note   Date:  02/27/2017   ID:  [name] Case, DOB 31-May-1932, MRN [id number]  PCP:  Hoyt Koch, MD  Cardiologist:  Dr. Marlou Porch    Chief Complaint  Patient presents with  . Atrial Fibrillation      History of Present Illness: [name] Case is a 83 y.o. female who presents for SOB.     Pt has had 3 ER visits for SOB on last visit 02/21/17 he had cellulitis of his leg.  Venous doppler with no DVT + fluid.  She has hx of chronic SOB.  She has episodes of PAF and now permanent a fib.and on Eliquis 5 mg BD, CHA2DS2VASc of 4.  Last seen by Dr. Marlou Porch 07/28/17.  Recent PFTs in August 2016 showed minimal obstructive airways disease with , minimal diffusion defect.  Her toprol was increased to 50 mg daily..  She is maintaining a fib.  Rate controlled.    Per Dr. Marlou Porch " We did not pursue cardioversion at that time given her lack of symptoms with well rate controlled atrial fibrillation (low likelihood of NSR maintenance)."    She does have SOB when walking around her house at times.  She has been this way since A fib.  She went to ER due to increased lower ext swelling and blisters on leg.  Neg DVT and she never filled ABX because her legs looked better after swelling improved.   Swelling mostly in Rt leg.  She does have varicosities in both legs and has had knee surgery bil.  She has had no chest pain .    Her wt has increased by 15 lbs since 07/2016.  She has increased her food intake and with swelling had been eating some potato chips.  We discussed abd fat and how it impedes respirations as well.        Past Medical History:  Diagnosis Date  . Allergy   . Asthma   . Atrial fibrillation ([place])   . Bronchitis   . CHF (congestive heart failure) ([place])   . Diabetes mellitus without complication ([place])   . GERD (gastroesophageal reflux disease)   . Hyperlipidemia   . Hypertension   . Thyroid disease     Past Surgical History:  Procedure Laterality Date   . ABDOMINAL HYSTERECTOMY    . APPENDECTOMY    . CHOLECYSTECTOMY    . TOTAL KNEE ARTHROPLASTY       Current Outpatient Prescriptions  Medication Sig Dispense Refill  . apixaban (ELIQUIS) 5 MG TABS tablet Take 1 tablet (5 mg total) by mouth 2 (two) times daily. 180 tablet 3  . atorvastatin (LIPITOR) 20 MG tablet TAKE 1 TABLET BY MOUTH DAILY AT 6PM 90 tablet 0  . budesonide-formoterol (SYMBICORT) 160-4.5 MCG/ACT inhaler Inhale 2 puffs into the lungs 2 (two) times daily as needed (Shortness of breath).    . diltiazem (CARDIZEM CD) 180 MG 24 hr capsule TAKE 1 CAPSULE BY MOUTH DAILY 90 capsule 1  . furosemide (LASIX) 80 MG tablet TAKE 1/2 TABLET BY MOUTH DAILY 30 tablet 1  . irbesartan (AVAPRO) 150 MG tablet Take 0.5 tablets (75 mg total) by mouth daily. 30 tablet 11  . levothyroxine (SYNTHROID, LEVOTHROID) 100 MCG tablet TAKE 1 TABLET BY MOUTH ONCE DAILY 90 tablet 3  . metoprolol succinate (TOPROL-XL) 50 MG 24 hr tablet Take 1 tablet (50 mg total) by mouth daily. Take with or immediately following a meal. 90 tablet 3  . potassium chloride (  K-DUR) 10 MEQ tablet Take 1 tablet (10 mEq total) by mouth daily. (Patient taking differently: Take 10 mEq by mouth 3 (three) times a week. ) 90 tablet 3   No current facility-administered medications for this visit.     Allergies:   Codeine    Social History:  The patient  reports that she has never smoked. She has never used smokeless tobacco. She reports that she does not drink alcohol or use drugs.   Family History:  The patient's family history includes Colon cancer in her mother; Heart disease in her mother.    ROS:  General:no colds or fevers, + weight increase 15 lbs.  Skin:no rashes or ulcers HEENT:no blurred vision, no congestion CV:see HPI PUL:see HPI GI:no diarrhea constipation or melena, no indigestion GU:no hematuria, no dysuria MS:no joint pain, no claudication, walks with cane for balance and knee pain  Neuro:no syncope, no  lightheadedness Endo:+ diabetes, + thyroid disease  Wt Readings from Last 3 Encounters:  02/27/17 237 lb (107.5 kg)  02/23/17 236 lb (107 kg)  11/30/16 232 lb 7 oz (105.4 kg)     PHYSICAL EXAM: VS:  BP 110/60   Pulse 85   Ht 5' (1.524 m)   Wt 237 lb (107.5 kg)   LMP  (LMP Unknown)   SpO2 93%   BMI 46.29 kg/m  , BMI Body mass index is 46.29 kg/m. General:Pleasant affect, NAD Skin:Warm and dry, brisk capillary refill HEENT:normocephalic, sclera clear, mucus membranes moist Neck:supple, no JVD, no bruits  Heart:irreg irreg without murmur, gallup, rub or click Lungs:clear without rales, rhonchi, or wheezes JEH:UDJS, non tender, + BS, do not palpate liver spleen or masses Ext:+1 lower ext edema, 1+ pedal pulses, 2+ radial pulses Neuro:alert and oriented X 3, MAE, follows commands, + facial symmetry    EKG:  EKG is NOT ordered today.    Recent Labs: 02/29/2016: ALT 17; Pro B Natriuretic peptide (BNP) 174.0; TSH 1.17 02/21/2017: B Natriuretic Peptide 217.4; BUN 18; Creatinine, Ser 1.02; Hemoglobin 12.8; Platelets 222; Potassium 3.9; Sodium 138    Lipid Panel    Component Value Date/Time   CHOL 209 (H) 06/08/2015 1126   TRIG 147.0 06/08/2015 1126   HDL 48.80 06/08/2015 1126   CHOLHDL 4 06/08/2015 1126   VLDL 29.4 06/08/2015 1126   LDLCALC 131 (H) 06/08/2015 1126   LDLDIRECT 188.6 07/19/2007 0000       Other studies Reviewed: Additional studies/ records that were reviewed today include: . ECHO: 01/09/16 Study Conclusions  - Left ventricle: The cavity size was normal. Systolic function was   normal. The estimated ejection fraction was in the range of 50%   to 55%. Wall motion was normal; there were no regional wall   motion abnormalities. - Aortic valve: Transvalvular velocity was within the normal range.   There was no stenosis. There was no regurgitation. - Mitral valve: There was trivial regurgitation. - Left atrium: The atrium was mildly dilated. - Right  ventricle: The cavity size was normal. Wall thickness was   normal. Systolic function was normal. - Tricuspid valve: There was trivial regurgitation. - Pulmonary arteries: Systolic pressure was within the normal   range. - Inferior vena cava: The vessel was normal in size. The   respirophasic diameter changes were in the normal range (>= 50%),   consistent with normal central venous pressure.   ASSESSMENT AND PLAN:  1.  Permanent a fib, rate controlled, on Eliquis reminded not to miss any doses. No bleeding. Follow up  with Dr. Gillian Shields in 6 months   2. Lower ext edema increase Lasix to BID for 2 days then back to 40 mg daily.  Labs on 24th were normal.  Watch salt intake.  Weigh daily, call if wt increasing.  3. DOE is stable but a fib may be playing a role.  We discussed calling if increased.  EF is normal.    4. Obesity will monitor weight and decrease food intake.   5. HLD will check lipids today she has not eaten continue statin  6. DM-2 diet controlled will check hgBA1c.    7. HTN stable.  8. anticoagulation  Current medicines are reviewed with the patient today.  The patient Has no concerns regarding medicines.  The following changes have been made:  See above Labs/ tests ordered today include:see above  Disposition:   FU:  see above  Signed, Cecilie Kicks, NP  02/27/2017 9:25 AM    [address], [place] [place], [place] Phone: [phone]; Fax: [phone]

## 2017-02-27 ENCOUNTER — Encounter: Payer: Self-pay | Admitting: Cardiology

## 2017-02-27 ENCOUNTER — Ambulatory Visit (INDEPENDENT_AMBULATORY_CARE_PROVIDER_SITE_OTHER): Payer: Medicare Other | Admitting: Cardiology

## 2017-02-27 VITALS — BP 110/60 | HR 85 | Ht 60.0 in | Wt 237.0 lb

## 2017-02-27 DIAGNOSIS — R600 Localized edema: Secondary | ICD-10-CM | POA: Diagnosis not present

## 2017-02-27 DIAGNOSIS — E785 Hyperlipidemia, unspecified: Secondary | ICD-10-CM

## 2017-02-27 DIAGNOSIS — R0602 Shortness of breath: Secondary | ICD-10-CM

## 2017-02-27 DIAGNOSIS — E119 Type 2 diabetes mellitus without complications: Secondary | ICD-10-CM | POA: Diagnosis not present

## 2017-02-27 DIAGNOSIS — I482 Chronic atrial fibrillation: Secondary | ICD-10-CM

## 2017-02-27 DIAGNOSIS — I4821 Permanent atrial fibrillation: Secondary | ICD-10-CM

## 2017-02-27 DIAGNOSIS — Z7901 Long term (current) use of anticoagulants: Secondary | ICD-10-CM

## 2017-02-27 DIAGNOSIS — I10 Essential (primary) hypertension: Secondary | ICD-10-CM | POA: Diagnosis not present

## 2017-02-27 NOTE — Patient Instructions (Addendum)
Medication Instructions:  1. INCREASE LASIX TO 40 MG TWICE DAILY FOR 2 DAYS THEN RESUME YOUR REGULAR DOSE OF LASIX 40 MG DAILY  Labwork: 1. TODAY LIPID, LFT, HGB A1C  Testing/Procedures: NONE ORDERED  Follow-Up: Your physician wants you to follow-up in: 6 MONTHS WITH DR. Marlou Porch You will receive a reminder letter in the mail two months in advance. If you don't receive a letter, please call our office to schedule the follow-up appointment.   Any Other Special Instructions Will Be Listed Below (If Applicable). PER LAURA INGOLD, NP PLEASE WEIGH DAILY AND CALL THE OFFICE IF YOUR WEIGHT IS INCREASED 3 LB'S IN 1 DAY OR 5 LB'S IN 1 WEEK [phone] LAURA INGOLD, NP  MONITOR DIET CONTROL PER NP  CALL THE OFFICE FOR A SOONER APPT. IF YOUR SHORTNESS OF BREATH WORSENS  If you need a refill on your cardiac medications before your next appointment, please call your pharmacy.  Low-Sodium Eating Plan Sodium, which is an element that makes up salt, helps you maintain a healthy balance of fluids in your body. Too much sodium can increase your blood pressure and cause fluid and waste to be held in your body. Your health care provider or dietitian may recommend following this plan if you have high blood pressure (hypertension), kidney disease, liver disease, or heart failure. Eating less sodium can help lower your blood pressure, reduce swelling, and protect your heart, liver, and kidneys. What are tips for following this plan? General guidelines  Most people on this plan should limit their sodium intake to 1,500-2,000 mg (milligrams) of sodium each day. Reading food labels  The Nutrition Facts label lists the amount of sodium in one serving of the food. If you eat more than one serving, you must multiply the listed amount of sodium by the number of servings.  Choose foods with less than 140 mg of sodium per serving.  Avoid foods with 300 mg of sodium or more per serving. Shopping  Look for  lower-sodium products, often labeled as "low-sodium" or "no salt added."  Always check the sodium content even if foods are labeled as "unsalted" or "no salt added".  Buy fresh foods. ? Avoid canned foods and premade or frozen meals. ? Avoid canned, cured, or processed meats  Buy breads that have less than 80 mg of sodium per slice. Cooking  Eat more home-cooked food and less restaurant, buffet, and fast food.  Avoid adding salt when cooking. Use salt-free seasonings or herbs instead of table salt or sea salt. Check with your health care provider or pharmacist before using salt substitutes.  Cook with plant-based oils, such as canola, sunflower, or olive oil. Meal planning  When eating at a restaurant, ask that your food be prepared with less salt or no salt, if possible.  Avoid foods that contain MSG (monosodium glutamate). MSG is sometimes added to [place] food, bouillon, and some canned foods. What foods are recommended? The items listed may not be a complete list. Talk with your dietitian about what dietary choices are best for you. Grains Low-sodium cereals, including oats, puffed wheat and rice, and shredded wheat. Low-sodium crackers. Unsalted rice. Unsalted pasta. Low-sodium bread. Whole-grain breads and whole-grain pasta. Vegetables Fresh or frozen vegetables. "No salt added" canned vegetables. "No salt added" tomato sauce and paste. Low-sodium or reduced-sodium tomato and vegetable juice. Fruits Fresh, frozen, or canned fruit. Fruit juice. Meats and other protein foods Fresh or frozen (no salt added) meat, poultry, seafood, and fish. Low-sodium canned tuna and  salmon. Unsalted nuts. Dried peas, beans, and lentils without added salt. Unsalted canned beans. Eggs. Unsalted nut butters. Dairy Milk. Soy milk. Cheese that is naturally low in sodium, such as ricotta cheese, fresh mozzarella, or Swiss cheese Low-sodium or reduced-sodium cheese. Cream cheese. Yogurt. Fats and  oils Unsalted butter. Unsalted margarine with no trans fat. Vegetable oils such as canola or olive oils. Seasonings and other foods Fresh and dried herbs and spices. Salt-free seasonings. Low-sodium mustard and ketchup. Sodium-free salad dressing. Sodium-free light mayonnaise. Fresh or refrigerated horseradish. Lemon juice. Vinegar. Homemade, reduced-sodium, or low-sodium soups. Unsalted popcorn and pretzels. Low-salt or salt-free chips. What foods are not recommended? The items listed may not be a complete list. Talk with your dietitian about what dietary choices are best for you. Grains Instant hot cereals. Bread stuffing, pancake, and biscuit mixes. Croutons. Seasoned rice or pasta mixes. Noodle soup cups. Boxed or frozen macaroni and cheese. Regular salted crackers. Self-rising flour. Vegetables Sauerkraut, pickled vegetables, and relishes. Olives. [place] fries. Onion rings. Regular canned vegetables (not low-sodium or reduced-sodium). Regular canned tomato sauce and paste (not low-sodium or reduced-sodium). Regular tomato and vegetable juice (not low-sodium or reduced-sodium). Frozen vegetables in sauces. Meats and other protein foods Meat or fish that is salted, canned, smoked, spiced, or pickled. Bacon, ham, sausage, hotdogs, corned beef, chipped beef, packaged lunch meats, salt pork, jerky, pickled herring, anchovies, regular canned tuna, sardines, salted nuts. Dairy Processed cheese and cheese spreads. Cheese curds. Blue cheese. Feta cheese. String cheese. Regular cottage cheese. Buttermilk. Canned milk. Fats and oils Salted butter. Regular margarine. Ghee. Bacon fat. Seasonings and other foods Onion salt, garlic salt, seasoned salt, table salt, and sea salt. Canned and packaged gravies. Worcestershire sauce. Tartar sauce. Barbecue sauce. Teriyaki sauce. Soy sauce, including reduced-sodium. Steak sauce. Fish sauce. Oyster sauce. Cocktail sauce. Horseradish that you find on the shelf.  Regular ketchup and mustard. Meat flavorings and tenderizers. Bouillon cubes. Hot sauce and Tabasco sauce. Premade or packaged marinades. Premade or packaged taco seasonings. Relishes. Regular salad dressings. Salsa. Potato and tortilla chips. Corn chips and puffs. Salted popcorn and pretzels. Canned or dried soups. Pizza. Frozen entrees and pot pies. Summary  Eating less sodium can help lower your blood pressure, reduce swelling, and protect your heart, liver, and kidneys.  Most people on this plan should limit their sodium intake to 1,500-2,000 mg (milligrams) of sodium each day.  Canned, boxed, and frozen foods are high in sodium. Restaurant foods, fast foods, and pizza are also very high in sodium. You also get sodium by adding salt to food.  Try to cook at home, eat more fresh fruits and vegetables, and eat less fast food, canned, processed, or prepared foods. This information is not intended to replace advice given to you by your health care provider. Make sure you discuss any questions you have with your health care provider. Document Released: 02/07/2002 Document Revised: 08/11/2016 Document Reviewed: 08/11/2016 Elsevier Interactive Patient Education  2017 [employer].

## 2017-02-28 ENCOUNTER — Other Ambulatory Visit: Payer: Self-pay | Admitting: Internal Medicine

## 2017-02-28 LAB — HEPATIC FUNCTION PANEL
ALBUMIN: 4 g/dL (ref 3.5–4.7)
ALK PHOS: 76 IU/L (ref 39–117)
ALT: 28 IU/L (ref 0–32)
AST: 22 IU/L (ref 0–40)
Bilirubin Total: 0.3 mg/dL (ref 0.0–1.2)
Bilirubin, Direct: 0.11 mg/dL (ref 0.00–0.40)
Total Protein: 6.4 g/dL (ref 6.0–8.5)

## 2017-02-28 LAB — LIPID PANEL
CHOLESTEROL TOTAL: 146 mg/dL (ref 100–199)
Chol/HDL Ratio: 3 ratio (ref 0.0–4.4)
HDL: 48 mg/dL (ref >39)
LDL CALC: 75 mg/dL (ref 0–99)
TRIGLYCERIDES: 114 mg/dL (ref 0–149)
VLDL CHOLESTEROL CAL: 23 mg/dL (ref 5–40)

## 2017-02-28 LAB — HEMOGLOBIN A1C
ESTIMATED AVERAGE GLUCOSE: 137 mg/dL
HEMOGLOBIN A1C: 6.4 % — AB (ref 4.8–5.6)

## 2017-03-07 ENCOUNTER — Other Ambulatory Visit: Payer: Self-pay | Admitting: Internal Medicine

## 2017-03-12 ENCOUNTER — Other Ambulatory Visit: Payer: Self-pay | Admitting: Internal Medicine

## 2017-03-13 ENCOUNTER — Other Ambulatory Visit: Payer: Self-pay | Admitting: Internal Medicine

## 2017-03-21 ENCOUNTER — Other Ambulatory Visit: Payer: Self-pay | Admitting: Internal Medicine

## 2017-04-16 ENCOUNTER — Telehealth: Payer: Self-pay | Admitting: Internal Medicine

## 2017-04-16 NOTE — Telephone Encounter (Signed)
Patient states she is in a donut hole and can no longer afford eliquis.  Patient states she was told by her pharmacy there was not an alternative to this medication.  Patient would like to know if she has to take this medication.  Please follow up in regard.

## 2017-04-16 NOTE — Telephone Encounter (Signed)
She has atrial fibrillation which requires her to have blood thinners otherwise she increases the risk for blood clots which can lead to heart attacks and strokes. If she is unable to afford the Eliquis she may need to start on coumadin.

## 2017-04-17 NOTE — Telephone Encounter (Signed)
Notified pt w/Greg response. Pt states she will loike to discuss w/dr. Sharlet Salina first she have enough eliquis until 9/15. Inform pt per her last ov w/NP she is to follow-up w/Dr. Sharlet Salina in Sept. Made appt for 05/07/17 @ 9:30...Johny Chess

## 2017-05-07 ENCOUNTER — Ambulatory Visit: Payer: Medicare Other | Admitting: Internal Medicine

## 2017-05-07 ENCOUNTER — Encounter (HOSPITAL_COMMUNITY): Payer: Self-pay | Admitting: *Deleted

## 2017-05-07 DIAGNOSIS — E039 Hypothyroidism, unspecified: Secondary | ICD-10-CM | POA: Insufficient documentation

## 2017-05-07 DIAGNOSIS — J45909 Unspecified asthma, uncomplicated: Secondary | ICD-10-CM | POA: Insufficient documentation

## 2017-05-07 DIAGNOSIS — I110 Hypertensive heart disease with heart failure: Secondary | ICD-10-CM | POA: Insufficient documentation

## 2017-05-07 DIAGNOSIS — Z79899 Other long term (current) drug therapy: Secondary | ICD-10-CM | POA: Insufficient documentation

## 2017-05-07 DIAGNOSIS — E119 Type 2 diabetes mellitus without complications: Secondary | ICD-10-CM | POA: Diagnosis not present

## 2017-05-07 DIAGNOSIS — R040 Epistaxis: Secondary | ICD-10-CM | POA: Insufficient documentation

## 2017-05-07 DIAGNOSIS — Z7901 Long term (current) use of anticoagulants: Secondary | ICD-10-CM | POA: Diagnosis not present

## 2017-05-07 DIAGNOSIS — I509 Heart failure, unspecified: Secondary | ICD-10-CM | POA: Insufficient documentation

## 2017-05-07 DIAGNOSIS — E785 Hyperlipidemia, unspecified: Secondary | ICD-10-CM | POA: Diagnosis not present

## 2017-05-07 NOTE — ED Triage Notes (Signed)
Pt was siting down, felt headache around eyes, then noticed blood coming from r nostril; onset at 2000. Pt is on Eliquis for afib. Pt holding wash cloth to nose, small amounts of blood noted.

## 2017-05-08 ENCOUNTER — Emergency Department (HOSPITAL_COMMUNITY)
Admission: EM | Admit: 2017-05-08 | Discharge: 2017-05-08 | Disposition: A | Discharge status: Home / Self Care | Payer: Medicare Other | Attending: Emergency Medicine | Admitting: Emergency Medicine

## 2017-05-08 DIAGNOSIS — R040 Epistaxis: Secondary | ICD-10-CM

## 2017-05-08 MED ORDER — LIDOCAINE-EPINEPHRINE 1 %-1:100000 IJ SOLN
20.0000 mL | Freq: Once | INTRAMUSCULAR | Status: DC
  Filled 2017-05-08: qty 20

## 2017-05-08 MED ORDER — TRANEXAMIC ACID 1000 MG/10ML IV SOLN
500.0000 mg | Freq: Once | INTRAVENOUS | Status: AC
  Administered 2017-05-08: 500 mg via TOPICAL
  Filled 2017-05-08: qty 10

## 2017-05-08 NOTE — ED Provider Notes (Signed)
[facility] DEPT Provider Note   CSN: [id number] Arrival date & time: 05/07/17  2227     History   Chief Complaint Chief Complaint  Patient presents with  . Epistaxis    HPI [name] Case is a 83 y.o. female.  The history is provided by the patient and a relative.  Epistaxis   This is a new problem. The current episode started yesterday. The problem occurs constantly. The problem has been resolved. The problem is associated with anticoagulants. The bleeding has been from the right nare. She has tried applying pressure for the symptoms. The treatment provided significant relief.  pt reports nose bleed recently "on and off" but got worse yesterday.  It is now resolved with pressure No new cp/sob No new weakness No other complaints  Past Medical History:  Diagnosis Date  . Allergy   . Asthma   . Atrial fibrillation ([place])   . Bronchitis   . CHF (congestive heart failure) ([place])   . Diabetes mellitus without complication ([place])   . GERD (gastroesophageal reflux disease)   . Hyperlipidemia   . Hypertension   . Thyroid disease     Patient Active Problem List   Diagnosis Date Noted  . Lichen planus 77/41/2878  . Acute respiratory failure with hypoxia ([place])   . Obesity hypoventilation syndrome ([place])   . Hypokalemia   . Acute on chronic congestive heart failure ([place]) 01/09/2016  . Atrial fibrillation with RVR ([place]) 01/09/2016  . Dyspnea 05/28/2015  . History of snoring 05/20/2015  . Chronic respiratory failure with hypercapnia ([place])   . Type 2 diabetes mellitus without complication ([place])   . Atrial fibrillation ([place]) 01/06/2015  . Hypothyroidism 03/09/2007  . Hyperlipidemia 03/09/2007  . Morbid obesity ([place]) 03/09/2007  . Essential hypertension 03/09/2007  . ALLERGIC RHINITIS 03/09/2007  . Asthma 03/09/2007  . GERD 03/09/2007  . INCONTINENCE 03/09/2007    Past Surgical History:  Procedure Laterality Date  . ABDOMINAL HYSTERECTOMY    . APPENDECTOMY    .  CHOLECYSTECTOMY    . TOTAL KNEE ARTHROPLASTY      OB History    No data available       Home Medications    Prior to Admission medications   Medication Sig Start Date End Date Taking? Authorizing Provider  apixaban (ELIQUIS) 5 MG TABS tablet Take 1 tablet (5 mg total) by mouth 2 (two) times daily. 07/28/16   Jerline Pain, MD  atorvastatin (LIPITOR) 20 MG tablet TAKE 1 TABLET BY MOUTH DAILY AT 6PM 03/16/17   Golden Circle, FNP  budesonide-formoterol (SYMBICORT) 160-4.5 MCG/ACT inhaler Inhale 2 puffs into the lungs 2 (two) times daily as needed (Shortness of breath).    [provider]  diltiazem (CARDIZEM CD) 180 MG 24 hr capsule TAKE 1 CAPSULE BY MOUTH DAILY 03/09/17   Golden Circle, FNP  furosemide (LASIX) 80 MG tablet TAKE 1/2 TABLET BY MOUTH DAILY 03/02/17   Golden Circle, FNP  irbesartan (AVAPRO) 150 MG tablet Take 0.5 tablets (75 mg total) by mouth daily. 01/17/16   Plotnikov, Evie Lacks, MD  levothyroxine (SYNTHROID, LEVOTHROID) 100 MCG tablet TAKE 1 TABLET BY MOUTH ONCE DAILY 12/03/16   Hoyt Koch, MD  metoprolol succinate (TOPROL-XL) 50 MG 24 hr tablet Take 1 tablet (50 mg total) by mouth daily. Take with or immediately following a meal. 07/28/16 02/27/17  Jerline Pain, MD  potassium chloride (K-DUR) 10 MEQ tablet Take 1 tablet (10 mEq total) by mouth daily. Patient  taking differently: Take 10 mEq by mouth 3 (three) times a week.  07/28/16   Jerline Pain, MD    Family History Family History  Problem Relation Age of Onset  . Heart disease Mother   . Colon cancer Mother     Social History Social History  Substance Use Topics  . Smoking status: Never Smoker  . Smokeless tobacco: Never Used  . Alcohol use No     Allergies   Codeine   Review of Systems Review of Systems  Constitutional: Negative for fever.  HENT: Positive for nosebleeds.   Respiratory: Negative for shortness of breath.   Cardiovascular: Negative for chest pain.    Neurological: Negative for weakness.  All other systems reviewed and are negative.    Physical Exam Updated Vital Signs BP 131/66   Pulse 66   Temp 97.7 F (36.5 C) (Oral)   LMP  (LMP Unknown)   SpO2 95%   Physical Exam CONSTITUTIONAL: Well developed/well nourished HEAD: Normocephalic/atraumatic EYES: EOMI/PERRL ENMT: Mucous membranes moist, dried blood in right nare, no other active bleeding, no blood in oropharynx NECK: supple no meningeal signs CV: irregular LUNGS: Lungs are clear to auscultation bilaterally, no apparent distress ABDOMEN: soft, nontender  NEURO: Pt is awake/alert/appropriate, moves all extremitiesx4.  No facial droop.   EXTREMITIES:   full ROM SKIN: warm, color normal PSYCH: no abnormalities of mood noted, alert and oriented to situation   ED Treatments / Results  Labs (all labs ordered are listed, but only abnormal results are displayed) Labs Reviewed - No data to display  EKG  EKG Interpretation None       Radiology No results found.  Procedures .Epistaxis Management Date/Time: 05/08/2017 3:58 AM Performed by: Ripley Fraise Authorized by: Ripley Fraise   Consent:    Consent obtained:  Verbal   Consent given by:  Patient   Risks discussed:  Pain Anesthesia (see MAR for exact dosages):    Anesthesia method:  Topical application   Topical anesthesia: lidocaine. Procedure details:    Treatment site:  R anterior   Treatment method:  Silver nitrate (TXA)   Treatment complexity:  Limited   Treatment episode: initial   Post-procedure details:    Assessment:  Bleeding stopped   Patient tolerance of procedure:  Tolerated well, no immediate complications Comments:     500mg  TXA placed on cotton, placed in nose for 10 minutes, bleeding resolved    Medications Ordered in ED Medications  lidocaine-EPINEPHrine (XYLOCAINE W/EPI) 2 %-1:100000 (with pres) injection 20 mL (not administered)     Initial Impression / Assessment and  Plan / ED Course  I have reviewed the triage vital signs and the nursing notes.        Pt improved Discussed strict ER return precautions   Final Clinical Impressions(s) / ED Diagnoses   Final diagnoses:  Epistaxis  Right-sided epistaxis    New Prescriptions New Prescriptions   No medications on file     Ripley Fraise, MD 05/08/17 [phone]

## 2017-05-08 NOTE — ED Notes (Signed)
No bleeding at this time. 

## 2017-05-12 ENCOUNTER — Encounter: Payer: Self-pay | Admitting: Internal Medicine

## 2017-05-12 ENCOUNTER — Ambulatory Visit (INDEPENDENT_AMBULATORY_CARE_PROVIDER_SITE_OTHER): Payer: Medicare Other | Admitting: Internal Medicine

## 2017-05-12 VITALS — BP 130/82 | HR 73 | Temp 97.6°F | Ht 60.0 in | Wt 237.0 lb

## 2017-05-12 DIAGNOSIS — I5032 Chronic diastolic (congestive) heart failure: Secondary | ICD-10-CM

## 2017-05-12 DIAGNOSIS — E119 Type 2 diabetes mellitus without complications: Secondary | ICD-10-CM

## 2017-05-12 NOTE — Patient Instructions (Addendum)
We are not making any changes today.    

## 2017-05-12 NOTE — Assessment & Plan Note (Signed)
Stable today, taking lasix regularly and knows to call if weight is increasing and will not go down or SOB increases. Declines PT for likely deconditioning at this time. Taking beta blocker, statin. Diastolic CHF.

## 2017-05-12 NOTE — Assessment & Plan Note (Signed)
Controlled by diet and exercise. HgA1c at goal off meds. Foot exam done today and reminded about yearly eye exam.

## 2017-05-12 NOTE — Progress Notes (Signed)
   Subjective:    Patient ID: [name] Case, female    DOB: May 24, 1932, 83 y.o.   MRN: [id number]  HPI The patient is an 83 YO female coming in for follow up of her CHF. She is taking her lasix as prescribed. She rarely has some weight gain and then doubles her lasix for a couple of days to get it back down. Lately her weight has been stable. She is having some fatigue but not unusual. She is not active. Her back is hurting more and she is not able to stand for more than 5-10 minutes without resting due to pain.She does not want to pursue treatment for that. She also does not want medication for it. She takes tylenol rarely and this is helpful.   Review of Systems  Constitutional: Positive for activity change and fatigue. Negative for appetite change, chills, fever and unexpected weight change.  HENT: Negative.   Eyes: Negative.   Respiratory: Negative.   Cardiovascular: Negative.   Gastrointestinal: Negative.   Musculoskeletal: Positive for arthralgias and back pain.  Neurological: Positive for weakness. Negative for dizziness, seizures, speech difficulty and headaches.  Psychiatric/Behavioral: Negative.       Objective:   Physical Exam  Constitutional: She is oriented to person, place, and time. She appears well-developed and well-nourished.  Obese  HENT:  Head: Normocephalic and atraumatic.  Eyes: EOM are normal.  Neck: Normal range of motion.  Cardiovascular: Normal rate and regular rhythm.   Pulmonary/Chest: Effort normal. No respiratory distress. She has no wheezes. She has no rales.  Abdominal: Soft.  Neurological: She is alert and oriented to person, place, and time.  Skin: Skin is warm and dry.   Vitals:   05/12/17 0930  BP: 130/82  Pulse: 73  Temp: 97.6 F (36.4 C)  TempSrc: Oral  SpO2: 98%  Weight: 237 lb (107.5 kg)  Height: 5' (1.524 m)      Assessment & Plan:

## 2017-06-01 ENCOUNTER — Other Ambulatory Visit: Payer: Self-pay | Admitting: Internal Medicine

## 2017-06-01 ENCOUNTER — Other Ambulatory Visit: Payer: Self-pay | Admitting: Family

## 2017-06-02 ENCOUNTER — Other Ambulatory Visit: Payer: Self-pay | Admitting: Internal Medicine

## 2017-06-03 ENCOUNTER — Telehealth: Payer: Self-pay | Admitting: Internal Medicine

## 2017-06-03 MED ORDER — IRBESARTAN 150 MG PO TABS: 75.0000 mg | ORAL_TABLET | Freq: Every day | ORAL | 5 refills | Status: DC

## 2017-06-03 NOTE — Telephone Encounter (Signed)
Pt requesting irbesartan refill.  I advised that PCP had previously filled rx and that we have not seen her in 2 years, so this will have to come from PCP office.  Pt expressed understanding.  Nothing further needed.

## 2017-06-03 NOTE — Telephone Encounter (Signed)
Pt called regarding her irbesartan (AVAPRO) 150 MG tablet prescription. She wanted to know if Dr Sharlet Salina could refill this for her. She was here on 05/12/17 for an appointment with Dr Sharlet Salina. Please advise. Pt uses [facility].

## 2017-06-03 NOTE — Telephone Encounter (Signed)
Reviewed chart pt is up-to-date [facility]...[name] Case

## 2017-06-16 DIAGNOSIS — Z23 Encounter for immunization: Secondary | ICD-10-CM | POA: Diagnosis not present

## 2017-07-01 ENCOUNTER — Telehealth: Payer: Self-pay | Admitting: Internal Medicine

## 2017-07-01 MED ORDER — ATORVASTATIN CALCIUM 20 MG PO TABS: ORAL_TABLET | ORAL | 1 refills | Status: DC

## 2017-07-01 NOTE — Telephone Encounter (Signed)
Pt called for a refill of her  atorvastatin (LIPITOR) 20 MG tablet  Please advise  [facility]

## 2017-07-01 NOTE — Telephone Encounter (Signed)
Reviewed chart pt is up-to-date sent refills to [facility].../lmb  

## 2017-08-03 ENCOUNTER — Other Ambulatory Visit: Payer: Self-pay | Admitting: Cardiology

## 2017-08-06 ENCOUNTER — Other Ambulatory Visit: Payer: Self-pay | Admitting: Family

## 2017-08-20 ENCOUNTER — Ambulatory Visit: Payer: Medicare Other | Admitting: Cardiology

## 2017-08-20 ENCOUNTER — Encounter: Payer: Self-pay | Admitting: Cardiology

## 2017-08-20 VITALS — BP 98/60 | HR 64 | Ht 60.75 in | Wt 231.0 lb

## 2017-08-20 DIAGNOSIS — I10 Essential (primary) hypertension: Secondary | ICD-10-CM

## 2017-08-20 DIAGNOSIS — R0602 Shortness of breath: Secondary | ICD-10-CM

## 2017-08-20 DIAGNOSIS — I482 Chronic atrial fibrillation: Secondary | ICD-10-CM | POA: Diagnosis not present

## 2017-08-20 DIAGNOSIS — I4821 Permanent atrial fibrillation: Secondary | ICD-10-CM

## 2017-08-20 DIAGNOSIS — I480 Paroxysmal atrial fibrillation: Secondary | ICD-10-CM

## 2017-08-20 LAB — BASIC METABOLIC PANEL
BUN / CREAT RATIO: 16 (ref 12–28)
BUN: 16 mg/dL (ref 8–27)
CO2: 29 mmol/L (ref 20–29)
CREATININE: 0.98 mg/dL (ref 0.57–1.00)
Calcium: 9.3 mg/dL (ref 8.7–10.3)
Chloride: 100 mmol/L (ref 96–106)
GFR calc Af Amer: 62 mL/min/{1.73_m2} (ref >59)
GFR calc non Af Amer: 54 mL/min/{1.73_m2} — ABNORMAL LOW (ref >59)
GLUCOSE: 116 mg/dL — AB (ref 65–99)
POTASSIUM: 3.5 mmol/L (ref 3.5–5.2)
SODIUM: 142 mmol/L (ref 134–144)

## 2017-08-20 LAB — CBC
HEMOGLOBIN: 13.1 g/dL (ref 11.1–15.9)
Hematocrit: 39.9 % (ref 34.0–46.6)
MCH: 31.3 pg (ref 26.6–33.0)
MCHC: 32.8 g/dL (ref 31.5–35.7)
MCV: 96 fL (ref 79–97)
Platelets: 255 10*3/uL (ref 150–379)
RBC: 4.18 x10E6/uL (ref 3.77–5.28)
RDW: 14.1 % (ref 12.3–15.4)
WBC: 9.2 10*3/uL (ref 3.4–10.8)

## 2017-08-20 MED ORDER — POTASSIUM CHLORIDE ER 10 MEQ PO TBCR: 10.0000 meq | EXTENDED_RELEASE_TABLET | Freq: Every day | ORAL | 3 refills | Status: DC

## 2017-08-20 NOTE — Patient Instructions (Signed)
Medication Instructions:  The current medical regimen is effective;  continue present plan and medications.  Labwork: Please have blood work today (CBC,BMP)  Follow-Up: Follow up in 6 months with Cecilie Kicks, NP.  You will receive a letter in the mail 2 months before you are due.  Please call us when you receive this letter to schedule your follow up appointment.  If you need a refill on your cardiac medications before your next appointment, please call your pharmacy.  Thank you for choosing [facility]!!

## 2017-08-20 NOTE — Progress Notes (Signed)
Cardiology Office Note   Date:  08/20/2017   ID:  [name] Case, DOB 17-May-1932, MRN [id number]  PCP:  Hoyt Koch, MD  Cardiologist:   Candee Furbish, MD       History of Present Illness: [name] Case is a 83 y.o. female who presents for follow-up visit for atrial fibrillation- hospitalization 01/12/16. She was having chronic shortness of breath seen by cardiology as well as pulmonology.  In the emergency room was found to be in atrial fibrillation with rapid ventricular response. She was given Lasix.   In 2016, we ordered an event monitor and after 30 days, no atrial fibrillation was discovered. She desired to come off of her Eliquis at that point. Subsequently however she had shown [place] once again atrial fibrillation, admission on 01/08/16.  Dr. Caryl Comes with EP saw her in consultation. Echocardiogram on 01/09/16 showed EF of 50-55% with dilated left atrium 47 mm. Her risk score was at least 4. She was placed on Cardizem 60 mg every 8 hours and metoprolol succinate 25 mg daily. He recommended that she be discharged on Eliquis 5 mg twice a day. She was going to follow-up with me to discuss cardioversion.  Less short of breath. She still has some sluggishness and at times can feel her heart beating funny in the early morning hours. Sometimes she drinks some water in this helps. Her granddaughter, [name] Case was here with her at prior visit. She has been helping with her 4 children. We did not pursue cardioversion at that time given her lack of symptoms with well rate controlled atrial fibrillation (low likelihood of NSR maintenance).  PFTs in August 2016 showed minimal obstructive airways disease with , minimal diffusion defect.  08/20/17--overall sometimes she is getting anxious.  She feels cold especially at night, feet are cold.  She has the house at 72 degrees she states.  Her thyroid at last check was normal.  She does take Synthroid.  No significant shortness of breath, no chest  pain, no bleeding, no syncope.    Past Medical History:  Diagnosis Date  . Allergy   . Asthma   . Atrial fibrillation ([place])   . Bronchitis   . CHF (congestive heart failure) ([place])   . Diabetes mellitus without complication ([place])   . GERD (gastroesophageal reflux disease)   . Hyperlipidemia   . Hypertension   . Thyroid disease     Past Surgical History:  Procedure Laterality Date  . ABDOMINAL HYSTERECTOMY    . APPENDECTOMY    . CHOLECYSTECTOMY    . TOTAL KNEE ARTHROPLASTY       Current Outpatient Medications  Medication Sig Dispense Refill  . apixaban (ELIQUIS) 5 MG TABS tablet Take 1 tablet (5 mg total) by mouth 2 (two) times daily. 180 tablet 3  . atorvastatin (LIPITOR) 20 MG tablet TAKE 1 TABLET BY MOUTH DAILY AT 6PM 90 tablet 1  . budesonide-formoterol (SYMBICORT) 160-4.5 MCG/ACT inhaler Inhale 2 puffs into the lungs 2 (two) times daily as needed (Shortness of breath).    . diltiazem (CARDIZEM CD) 180 MG 24 hr capsule TAKE 1 CAPSULE BY MOUTH DAILY 90 capsule 1  . furosemide (LASIX) 80 MG tablet TAKE 1/2 TABLET BY MOUTH DAILY 30 tablet 1  . irbesartan (AVAPRO) 150 MG tablet Take 0.5 tablets (75 mg total) by mouth daily. 30 tablet 5  . levothyroxine (SYNTHROID, LEVOTHROID) 100 MCG tablet TAKE 1 TABLET BY MOUTH ONCE DAILY 90 tablet 3  . metoprolol succinate (TOPROL-XL)  50 MG 24 hr tablet TAKE 1 TABLET BY MOUTH DAILY, TAKE WITH OR IMMEDIATELY FOLLOWING A MEAL 90 tablet 1  . potassium chloride (K-DUR) 10 MEQ tablet Take 1 tablet (10 mEq total) by mouth daily. 90 tablet 3   No current facility-administered medications for this visit.     Allergies:   Codeine    Social History:  The patient  reports that  has never smoked. she has never used smokeless tobacco. She reports that she does not drink alcohol or use drugs.   Family History:  The patient's family history includes Colon cancer in her mother; Heart disease in her mother.    ROS:  Please see the history of present  illness.   Positive for cold intolerance, shortness of breath.  Otherwise all other review of systems negative.  PHYSICAL EXAM: VS:  BP 98/60   Pulse 64   Ht 5' 0.75" (1.543 m)   Wt 231 lb (104.8 kg)   LMP  (LMP Unknown)   SpO2 94%   BMI 44.01 kg/m  , BMI Body mass index is 44.01 kg/m. GEN: Well nourished, well developed, in no acute distress  HEENT: normal  Neck: no JVD, carotid bruits, or masses Cardiac: irreg normal rate; no murmurs, rubs, or gallops,no edema  Respiratory:  clear to auscultation bilaterally, normal work of breathing GI: soft, nontender, nondistended, + BS MS: no deformity or atrophy  Skin: warm and dry, no rash Neuro:  Alert and Oriented x 3, Strength and sensation are intact Psych: euthymic mood, full affect    EKG:  EKG is not ordered today. Prior EKG shows atrial fibrillation with rapid ventricular response from early May 2017 hospitalization, personally viewed   Recent Labs: 02/21/2017: B Natriuretic Peptide 217.4; BUN 18; Creatinine, Ser 1.02; Hemoglobin 12.8; Platelets 222; Potassium 3.9; Sodium 138 02/27/2017: ALT 28    Lipid Panel    Component Value Date/Time   CHOL 146 02/27/2017 1000   TRIG 114 02/27/2017 1000   HDL 48 02/27/2017 1000   CHOLHDL 3.0 02/27/2017 1000   CHOLHDL 4 06/08/2015 1126   VLDL 29.4 06/08/2015 1126   LDLCALC 75 02/27/2017 1000   LDLDIRECT 188.6 07/19/2007 0000      Wt Readings from Last 3 Encounters:  08/20/17 231 lb (104.8 kg)  05/12/17 237 lb (107.5 kg)  02/27/17 237 lb (107.5 kg)    Echocardiogram: 01/08/15-EF 55%, no other abnormalities.  Other studies Reviewed: Additional studies/ records that were reviewed today include: Hospital records reviewed, lab work reviewed, echocardiogram reviewed. Review of the above records demonstrates: As above   ASSESSMENT AND PLAN:  Permanent atrial fibrillation  - Rate control strategy. She is on both metoprolol as well as diltiazem.  Doing quite well with this.  No  changes.  - Maintenance of sinus rhythm would be difficult at this point.  Dyspnea  - Echocardiogram showed normal ejection fraction.  - mild PFT abnormalities consistent with COPD. Continue to encourage conditioning, exercise, weight loss.  No changes.  Chronic anticoagulation  - No evidence of bleeding. Doing well.  - Brief, 1 day mild hematuria, resolved spontaneously.  - Creatinine 0.97, hemoglobin 13.1 at last check.  Checking again.  Morbid obesity-continue to encourage weight loss.  No changes made.  Continue to encourage weight loss.   Signed, Candee Furbish, MD  08/20/2017 10:49 AM    [facility] [address], [place], [place]  [address] Phone: [phone]; Fax: [phone]

## 2017-09-15 ENCOUNTER — Ambulatory Visit: Payer: Medicare Other | Admitting: Internal Medicine

## 2017-09-17 ENCOUNTER — Other Ambulatory Visit: Payer: Self-pay | Admitting: Internal Medicine

## 2017-10-05 ENCOUNTER — Other Ambulatory Visit: Payer: Self-pay | Admitting: Internal Medicine

## 2017-10-14 ENCOUNTER — Other Ambulatory Visit: Payer: Self-pay | Admitting: Family

## 2017-10-15 ENCOUNTER — Telehealth: Payer: Self-pay | Admitting: Internal Medicine

## 2017-10-15 MED ORDER — DILTIAZEM HCL ER COATED BEADS 180 MG PO CP24: 180.0000 mg | ORAL_CAPSULE | Freq: Every day | ORAL | 1 refills | Status: DC

## 2017-10-15 NOTE — Telephone Encounter (Signed)
Copied from [place] [id number]. Topic: Quick Communication - Rx Refill/Question >> Oct 15, 2017  2:13 PM Carolyn Stare wrote: Medication   diltiazem (CARDIZEM CD) 180 MG 24 hr capsule  Has the patient contacted their pharmacy yes    Preferred Pharmacy  [facility]     Agent: Please be advised that RX refills may take up to 3 business days. We ask that you follow-up with your pharmacy.

## 2017-10-26 ENCOUNTER — Emergency Department (HOSPITAL_COMMUNITY): Payer: Medicare Other

## 2017-10-26 ENCOUNTER — Encounter (HOSPITAL_COMMUNITY): Payer: Self-pay

## 2017-10-26 ENCOUNTER — Other Ambulatory Visit: Payer: Self-pay

## 2017-10-26 DIAGNOSIS — E785 Hyperlipidemia, unspecified: Secondary | ICD-10-CM | POA: Insufficient documentation

## 2017-10-26 DIAGNOSIS — I5032 Chronic diastolic (congestive) heart failure: Secondary | ICD-10-CM | POA: Insufficient documentation

## 2017-10-26 DIAGNOSIS — J209 Acute bronchitis, unspecified: Secondary | ICD-10-CM | POA: Diagnosis not present

## 2017-10-26 DIAGNOSIS — Z8249 Family history of ischemic heart disease and other diseases of the circulatory system: Secondary | ICD-10-CM | POA: Diagnosis not present

## 2017-10-26 DIAGNOSIS — Z6841 Body Mass Index (BMI) 40.0 and over, adult: Secondary | ICD-10-CM | POA: Diagnosis not present

## 2017-10-26 DIAGNOSIS — Z7901 Long term (current) use of anticoagulants: Secondary | ICD-10-CM | POA: Insufficient documentation

## 2017-10-26 DIAGNOSIS — Z885 Allergy status to narcotic agent status: Secondary | ICD-10-CM | POA: Insufficient documentation

## 2017-10-26 DIAGNOSIS — J40 Bronchitis, not specified as acute or chronic: Secondary | ICD-10-CM | POA: Diagnosis present

## 2017-10-26 DIAGNOSIS — K219 Gastro-esophageal reflux disease without esophagitis: Secondary | ICD-10-CM | POA: Insufficient documentation

## 2017-10-26 DIAGNOSIS — Z79899 Other long term (current) drug therapy: Secondary | ICD-10-CM | POA: Diagnosis not present

## 2017-10-26 DIAGNOSIS — J440 Chronic obstructive pulmonary disease with acute lower respiratory infection: Secondary | ICD-10-CM | POA: Diagnosis not present

## 2017-10-26 DIAGNOSIS — E119 Type 2 diabetes mellitus without complications: Secondary | ICD-10-CM | POA: Diagnosis not present

## 2017-10-26 DIAGNOSIS — I493 Ventricular premature depolarization: Secondary | ICD-10-CM | POA: Diagnosis not present

## 2017-10-26 DIAGNOSIS — I482 Chronic atrial fibrillation: Secondary | ICD-10-CM | POA: Diagnosis not present

## 2017-10-26 DIAGNOSIS — I110 Hypertensive heart disease with heart failure: Secondary | ICD-10-CM | POA: Diagnosis not present

## 2017-10-26 DIAGNOSIS — E039 Hypothyroidism, unspecified: Secondary | ICD-10-CM | POA: Insufficient documentation

## 2017-10-26 DIAGNOSIS — R05 Cough: Secondary | ICD-10-CM | POA: Diagnosis not present

## 2017-10-26 DIAGNOSIS — R0602 Shortness of breath: Secondary | ICD-10-CM | POA: Diagnosis not present

## 2017-10-26 DIAGNOSIS — J9601 Acute respiratory failure with hypoxia: Secondary | ICD-10-CM | POA: Insufficient documentation

## 2017-10-26 DIAGNOSIS — R0789 Other chest pain: Secondary | ICD-10-CM | POA: Diagnosis not present

## 2017-10-26 NOTE — ED Triage Notes (Signed)
Pt presents to the ed with complaints of cough, fever and cold chills x 2 days.

## 2017-10-27 ENCOUNTER — Encounter (HOSPITAL_COMMUNITY): Payer: Self-pay | Admitting: Internal Medicine

## 2017-10-27 ENCOUNTER — Observation Stay (HOSPITAL_COMMUNITY)
Admission: EM | Admit: 2017-10-27 | Discharge: 2017-10-28 | Disposition: A | Discharge status: Home / Self Care | Payer: Medicare Other | Attending: Family Medicine | Admitting: Family Medicine

## 2017-10-27 DIAGNOSIS — J208 Acute bronchitis due to other specified organisms: Secondary | ICD-10-CM

## 2017-10-27 DIAGNOSIS — I10 Essential (primary) hypertension: Secondary | ICD-10-CM | POA: Diagnosis present

## 2017-10-27 DIAGNOSIS — R0789 Other chest pain: Secondary | ICD-10-CM | POA: Diagnosis not present

## 2017-10-27 DIAGNOSIS — J9801 Acute bronchospasm: Secondary | ICD-10-CM

## 2017-10-27 DIAGNOSIS — I4891 Unspecified atrial fibrillation: Secondary | ICD-10-CM | POA: Diagnosis present

## 2017-10-27 DIAGNOSIS — J40 Bronchitis, not specified as acute or chronic: Secondary | ICD-10-CM

## 2017-10-27 DIAGNOSIS — J209 Acute bronchitis, unspecified: Secondary | ICD-10-CM | POA: Diagnosis not present

## 2017-10-27 DIAGNOSIS — R0602 Shortness of breath: Secondary | ICD-10-CM | POA: Diagnosis not present

## 2017-10-27 DIAGNOSIS — R05 Cough: Secondary | ICD-10-CM | POA: Diagnosis not present

## 2017-10-27 DIAGNOSIS — E039 Hypothyroidism, unspecified: Secondary | ICD-10-CM | POA: Diagnosis present

## 2017-10-27 LAB — GLUCOSE, CAPILLARY
Glucose-Capillary: 190 mg/dL — ABNORMAL HIGH (ref 65–99)
Glucose-Capillary: 194 mg/dL — ABNORMAL HIGH (ref 65–99)

## 2017-10-27 LAB — CBC WITH DIFFERENTIAL/PLATELET
BASOS ABS: 0.1 10*3/uL (ref 0.0–0.1)
BASOS PCT: 1 %
EOS ABS: 0.1 10*3/uL (ref 0.0–0.7)
Eosinophils Relative: 2 %
HEMATOCRIT: 39.7 % (ref 36.0–46.0)
HEMOGLOBIN: 12.9 g/dL (ref 12.0–15.0)
Lymphocytes Relative: 13 %
Lymphs Abs: 0.9 10*3/uL (ref 0.7–4.0)
MCH: 31.6 pg (ref 26.0–34.0)
MCHC: 32.5 g/dL (ref 30.0–36.0)
MCV: 97.3 fL (ref 78.0–100.0)
Monocytes Absolute: 0.7 10*3/uL (ref 0.1–1.0)
Monocytes Relative: 10 %
NEUTROS ABS: 5.3 10*3/uL (ref 1.7–7.7)
NEUTROS PCT: 74 %
Platelets: 216 10*3/uL (ref 150–400)
RBC: 4.08 MIL/uL (ref 3.87–5.11)
RDW: 14.3 % (ref 11.5–15.5)
WBC: 7 10*3/uL (ref 4.0–10.5)

## 2017-10-27 LAB — BASIC METABOLIC PANEL
ANION GAP: 13 (ref 5–15)
BUN: 11 mg/dL (ref 6–20)
CALCIUM: 9 mg/dL (ref 8.9–10.3)
CHLORIDE: 101 mmol/L (ref 101–111)
CO2: 25 mmol/L (ref 22–32)
CREATININE: 0.81 mg/dL (ref 0.44–1.00)
GFR calc non Af Amer: >60 mL/min (ref >60)
Glucose, Bld: 128 mg/dL — ABNORMAL HIGH (ref 65–99)
Potassium: 3.5 mmol/L (ref 3.5–5.1)
SODIUM: 139 mmol/L (ref 135–145)

## 2017-10-27 LAB — BRAIN NATRIURETIC PEPTIDE: B NATRIURETIC PEPTIDE 5: 247.9 pg/mL — AB (ref 0.0–100.0)

## 2017-10-27 LAB — INFLUENZA PANEL BY PCR (TYPE A & B)
Influenza A By PCR: NEGATIVE
Influenza B By PCR: NEGATIVE

## 2017-10-27 LAB — CBG MONITORING, ED
Glucose-Capillary: 239 mg/dL — ABNORMAL HIGH (ref 65–99)
Glucose-Capillary: 235 mg/dL — ABNORMAL HIGH (ref 65–99)

## 2017-10-27 LAB — I-STAT TROPONIN, ED: Troponin i, poc: 0 ng/mL (ref 0.00–0.08)

## 2017-10-27 MED ORDER — BUDESONIDE 0.25 MG/2ML IN SUSP
0.2500 mg | Freq: Two times a day (BID) | RESPIRATORY_TRACT | Status: DC
  Administered 2017-10-27 – 2017-10-28 (×3): 0.25 mg via RESPIRATORY_TRACT
  Filled 2017-10-27 (×4): qty 2

## 2017-10-27 MED ORDER — IPRATROPIUM-ALBUTEROL 0.5-2.5 (3) MG/3ML IN SOLN
3.0000 mL | Freq: Once | RESPIRATORY_TRACT | Status: AC
  Administered 2017-10-27: 3 mL via RESPIRATORY_TRACT
  Filled 2017-10-27: qty 3

## 2017-10-27 MED ORDER — OSELTAMIVIR PHOSPHATE 75 MG PO CAPS: 75.0000 mg | ORAL_CAPSULE | Freq: Two times a day (BID) | ORAL | Status: DC

## 2017-10-27 MED ORDER — INSULIN ASPART 100 UNIT/ML ~~LOC~~ SOLN
0.0000 [IU] | Freq: Three times a day (TID) | SUBCUTANEOUS | Status: DC
  Administered 2017-10-27: 2 [IU] via SUBCUTANEOUS
  Administered 2017-10-27 – 2017-10-28 (×3): 3 [IU] via SUBCUTANEOUS
  Administered 2017-10-28: 2 [IU] via SUBCUTANEOUS
  Filled 2017-10-27 (×2): qty 1

## 2017-10-27 MED ORDER — OSELTAMIVIR PHOSPHATE 30 MG PO CAPS: 30.0000 mg | ORAL_CAPSULE | Freq: Two times a day (BID) | ORAL | Status: DC

## 2017-10-27 MED ORDER — ACETAMINOPHEN 650 MG RE SUPP: 650.0000 mg | Freq: Four times a day (QID) | RECTAL | Status: DC | PRN

## 2017-10-27 MED ORDER — DILTIAZEM HCL ER COATED BEADS 180 MG PO CP24
180.0000 mg | ORAL_CAPSULE | Freq: Every day | ORAL | Status: DC
  Administered 2017-10-27 – 2017-10-28 (×2): 180 mg via ORAL
  Filled 2017-10-27 (×2): qty 1

## 2017-10-27 MED ORDER — ATORVASTATIN CALCIUM 20 MG PO TABS
20.0000 mg | ORAL_TABLET | Freq: Every day | ORAL | Status: DC
  Administered 2017-10-27: 20 mg via ORAL
  Filled 2017-10-27 (×2): qty 1

## 2017-10-27 MED ORDER — LEVALBUTEROL HCL 0.63 MG/3ML IN NEBU: 0.6300 mg | INHALATION_SOLUTION | Freq: Four times a day (QID) | RESPIRATORY_TRACT | Status: DC | PRN

## 2017-10-27 MED ORDER — OSELTAMIVIR PHOSPHATE 75 MG PO CAPS
75.0000 mg | ORAL_CAPSULE | Freq: Once | ORAL | Status: AC
  Administered 2017-10-27: 75 mg via ORAL
  Filled 2017-10-27: qty 1

## 2017-10-27 MED ORDER — METHYLPREDNISOLONE SODIUM SUCC 125 MG IJ SOLR
60.0000 mg | Freq: Once | INTRAMUSCULAR | Status: AC
  Administered 2017-10-27: 60 mg via INTRAVENOUS
  Filled 2017-10-27: qty 2

## 2017-10-27 MED ORDER — FUROSEMIDE 40 MG PO TABS
40.0000 mg | ORAL_TABLET | Freq: Every day | ORAL | Status: DC
  Administered 2017-10-27 – 2017-10-28 (×2): 40 mg via ORAL
  Filled 2017-10-27: qty 1
  Filled 2017-10-27: qty 2

## 2017-10-27 MED ORDER — ACETAMINOPHEN 325 MG PO TABS: 650.0000 mg | ORAL_TABLET | Freq: Four times a day (QID) | ORAL | Status: DC | PRN

## 2017-10-27 MED ORDER — LEVALBUTEROL HCL 0.63 MG/3ML IN NEBU
0.6300 mg | INHALATION_SOLUTION | Freq: Four times a day (QID) | RESPIRATORY_TRACT | Status: DC
  Administered 2017-10-27 – 2017-10-28 (×4): 0.63 mg via RESPIRATORY_TRACT
  Filled 2017-10-27 (×4): qty 3

## 2017-10-27 MED ORDER — ONDANSETRON HCL 4 MG/2ML IJ SOLN: 4.0000 mg | Freq: Four times a day (QID) | INTRAMUSCULAR | Status: DC | PRN

## 2017-10-27 MED ORDER — LEVOTHYROXINE SODIUM 100 MCG PO TABS
100.0000 ug | ORAL_TABLET | Freq: Every day | ORAL | Status: DC
  Administered 2017-10-27 – 2017-10-28 (×2): 100 ug via ORAL
  Filled 2017-10-27 (×3): qty 1

## 2017-10-27 MED ORDER — ONDANSETRON HCL 4 MG PO TABS: 4.0000 mg | ORAL_TABLET | Freq: Four times a day (QID) | ORAL | Status: DC | PRN

## 2017-10-27 MED ORDER — METHYLPREDNISOLONE SODIUM SUCC 40 MG IJ SOLR
40.0000 mg | Freq: Two times a day (BID) | INTRAMUSCULAR | Status: DC
  Administered 2017-10-27 – 2017-10-28 (×2): 40 mg via INTRAVENOUS
  Filled 2017-10-27 (×2): qty 1

## 2017-10-27 MED ORDER — APIXABAN 5 MG PO TABS
5.0000 mg | ORAL_TABLET | Freq: Two times a day (BID) | ORAL | Status: DC
  Administered 2017-10-27 – 2017-10-28 (×3): 5 mg via ORAL
  Filled 2017-10-27 (×3): qty 1

## 2017-10-27 MED ORDER — POTASSIUM CHLORIDE ER 10 MEQ PO TBCR
10.0000 meq | EXTENDED_RELEASE_TABLET | Freq: Every day | ORAL | Status: DC
Start: 2017-10-27 — End: 2017-10-28
  Administered 2017-10-27 – 2017-10-28 (×2): 10 meq via ORAL
  Filled 2017-10-27 (×4): qty 1

## 2017-10-27 MED ORDER — METOPROLOL SUCCINATE ER 50 MG PO TB24
50.0000 mg | ORAL_TABLET | Freq: Every day | ORAL | Status: DC
  Administered 2017-10-27 – 2017-10-28 (×2): 50 mg via ORAL
  Filled 2017-10-27 (×2): qty 1

## 2017-10-27 MED ORDER — IRBESARTAN 75 MG PO TABS
75.0000 mg | ORAL_TABLET | Freq: Every day | ORAL | Status: DC
  Administered 2017-10-27 – 2017-10-28 (×2): 75 mg via ORAL
  Filled 2017-10-27 (×2): qty 1

## 2017-10-27 NOTE — ED Notes (Signed)
Pt trialed on RA, sats dropped to 88%RA. Pt placed on 2L [place]

## 2017-10-27 NOTE — Progress Notes (Signed)
PHARMACY NOTE:  ANTIMICROBIAL RENAL DOSAGE ADJUSTMENT  Current antimicrobial regimen includes a mismatch between antimicrobial dosage and estimated renal function.  As per policy approved by the Pharmacy & Therapeutics and Medical Executive Committees, the antimicrobial dosage will be adjusted accordingly.  Current antimicrobial dosage:  Tamiflu 75 mg po bid  Indication: Influenza  Renal Function:  Estimated Creatinine Clearance: 54.9 mL/min (by C-G formula based on SCr of 0.81 mg/dL). []      On intermittent HD, scheduled: []      On CRRT    Antimicrobial dosage has been changed to:  Tamiflu 30 mg po bid    Harvel Quale, [facility] 10/27/2017 6:36 AM

## 2017-10-27 NOTE — ED Notes (Signed)
Dr. Gherghe at bedside 

## 2017-10-27 NOTE — H&P (Signed)
History and Physical    [name] Case BMW:[id number] DOB: [birth year] DOA: 10/27/2017  PCP: Hoyt Koch, MD  Patient coming from: Home.  Chief Complaint: Cough and shortness of breath.  HPI: [name] Case is a 84 y.o. female with history of atrial fibrillation, diastolic CHF, hypothyroidism, hypertension, asthma and diabetes mellitus presents to the ER because of persistent cough last 48 hours with wheezing and productive sputum and shortness of breath.  Patient states her symptoms started after she was sitting on a fireplace at home.  There was a lot of smoke at home.  Denies any chest pain.  Has been a subjective feeling of fever and chills.  ED Course: In the ER patient is found to be diffusely wheezing.  Chest x-ray was unremarkable patient was mildly febrile.  Patient was empirically started on Tamiflu and also was placed on nebulizer treatment with Solu-Medrol for bronchitis and admitted for further observation since patient was getting easily hypoxic.  Review of Systems: As per HPI, rest all negative.   Past Medical History:  Diagnosis Date  . Allergy   . Asthma   . Atrial fibrillation ([place])   . Bronchitis   . CHF (congestive heart failure) ([place])   . Diabetes mellitus without complication ([place])   . GERD (gastroesophageal reflux disease)   . Hyperlipidemia   . Hypertension   . Thyroid disease     Past Surgical History:  Procedure Laterality Date  . ABDOMINAL HYSTERECTOMY    . APPENDECTOMY    . CHOLECYSTECTOMY    . TOTAL KNEE ARTHROPLASTY       reports that  has never smoked. she has never used smokeless tobacco. She reports that she does not drink alcohol or use drugs.  Allergies  Allergen Reactions  . Codeine Hives and Rash    In cough syrup preparations    Family History  Problem Relation Age of Onset  . Heart disease Mother   . Colon cancer Mother     Prior to Admission medications   Medication Sig Start Date End Date Taking? Authorizing  Provider  apixaban (ELIQUIS) 5 MG TABS tablet Take 1 tablet (5 mg total) by mouth 2 (two) times daily. 07/28/16  Yes Jerline Pain, MD  atorvastatin (LIPITOR) 20 MG tablet TAKE 1 TABLET BY MOUTH DAILY AT 6PM 07/01/17  Yes Hoyt Koch, MD  diltiazem (CARDIZEM CD) 180 MG 24 hr capsule Take 1 capsule (180 mg total) by mouth daily. 10/15/17  Yes Hoyt Koch, MD  furosemide (LASIX) 80 MG tablet TAKE 1/2 TABLET BY MOUTH DAILY 10/05/17  Yes Hoyt Koch, MD  irbesartan (AVAPRO) 150 MG tablet Take 0.5 tablets (75 mg total) by mouth daily. 06/03/17  Yes Hoyt Koch, MD  levothyroxine (SYNTHROID, LEVOTHROID) 100 MCG tablet TAKE 1 TABLET BY MOUTH ONCE DAILY 12/03/16  Yes Hoyt Koch, MD  metoprolol succinate (TOPROL-XL) 50 MG 24 hr tablet TAKE 1 TABLET BY MOUTH DAILY, TAKE WITH OR IMMEDIATELY FOLLOWING A MEAL 08/03/17  Yes Jerline Pain, MD  potassium chloride (K-DUR) 10 MEQ tablet Take 1 tablet (10 mEq total) by mouth daily. 08/20/17  Yes Jerline Pain, MD  clotrimazole-betamethasone (LOTRISONE) cream APPLY TOPICALLY TWICE A DAY Patient not taking: Reported on 10/27/2017 09/17/17   Hoyt Koch, MD    Physical Exam: Vitals:   10/27/17 0415 10/27/17 0425 10/27/17 0515 10/27/17 0530  BP: 122/62  (!) 123/101 (!) 116/56  Pulse: (!) 110  (!) 123  Resp: 18  19 (!) 22  Temp:      TempSrc:      SpO2: 99% 96% 93% 94%  Weight:      Height:          Constitutional: Moderately built and nourished. Vitals:   10/27/17 0415 10/27/17 0425 10/27/17 0515 10/27/17 0530  BP: 122/62  (!) 123/101 (!) 116/56  Pulse: (!) 110  (!) 123   Resp: 18  19 (!) 22  Temp:      TempSrc:      SpO2: 99% 96% 93% 94%  Weight:      Height:       Eyes: Anicteric no pallor. ENMT: No discharge from the ears eyes nose or mouth. Neck: No JVD appreciated no mass felt. Respiratory: Bilateral expiratory wheezes no crepitations. Cardiovascular: S1-S2 heard no murmurs  appreciated. Abdomen: Soft nontender bowel sounds present. Musculoskeletal: No edema.  No joint effusion. Skin: No rash.  Skin appears warm. Neurologic: Alert awake oriented to time place and person.  Moves all extremities. Psychiatric: Appears normal.  Normal affect.   Labs on Admission: I have personally reviewed following labs and imaging studies  CBC: Recent Labs  Lab 10/27/17 0131  WBC 7.0  NEUTROABS 5.3  HGB 12.9  HCT 39.7  MCV 97.3  PLT 086   Basic Metabolic Panel: Recent Labs  Lab 10/27/17 0131  NA 139  K 3.5  CL 101  CO2 25  GLUCOSE 128*  BUN 11  CREATININE 0.81  CALCIUM 9.0   GFR: Estimated Creatinine Clearance: 54.9 mL/min (by C-G formula based on SCr of 0.81 mg/dL). Liver Function Tests: No results for input(s): AST, ALT, ALKPHOS, BILITOT, PROT, ALBUMIN in the last 168 hours. No results for input(s): LIPASE, AMYLASE in the last 168 hours. No results for input(s): AMMONIA in the last 168 hours. Coagulation Profile: No results for input(s): INR, PROTIME in the last 168 hours. Cardiac Enzymes: No results for input(s): CKTOTAL, CKMB, CKMBINDEX, TROPONINI in the last 168 hours. BNP (last 3 results) No results for input(s): PROBNP in the last 8760 hours. HbA1C: No results for input(s): HGBA1C in the last 72 hours. CBG: No results for input(s): GLUCAP in the last 168 hours. Lipid Profile: No results for input(s): CHOL, HDL, LDLCALC, TRIG, CHOLHDL, LDLDIRECT in the last 72 hours. Thyroid Function Tests: No results for input(s): TSH, T4TOTAL, FREET4, T3FREE, THYROIDAB in the last 72 hours. Anemia Panel: No results for input(s): VITAMINB12, FOLATE, FERRITIN, TIBC, IRON, RETICCTPCT in the last 72 hours. Urine analysis:    Component Value Date/Time   COLORURINE YELLOW 01/20/2015 [place] 01/20/2015 1153   LABSPEC 1.008 01/20/2015 1153   PHURINE 6.0 01/20/2015 1153   GLUCOSEU NEGATIVE 01/20/2015 1153   HGBUR LARGE (A) 01/20/2015 1153    BILIRUBINUR NEGATIVE 01/20/2015 1153   BILIRUBINUR n 07/01/2012 1115   KETONESUR NEGATIVE 01/20/2015 1153   PROTEINUR NEGATIVE 01/20/2015 1153   UROBILINOGEN 0.2 01/20/2015 1153   NITRITE NEGATIVE 01/20/2015 1153   LEUKOCYTESUR MODERATE (A) 01/20/2015 1153   Sepsis Labs: @LABRCNTIP (procalcitonin:4,lacticidven:4) )No results found for this or any previous visit (from the past 240 hour(s)).   Radiological Exams on Admission: Dg Chest 2 View  Result Date: 10/26/2017 CLINICAL DATA:  Productive cough EXAM: CHEST  2 VIEW COMPARISON:  02/21/2017 FINDINGS: Mild cardiomegaly. Left base atelectasis or scarring, stable. No confluent opacity on the right. No effusions. No acute bony abnormality. IMPRESSION: Mild cardiomegaly. Left base scarring or atelectasis. No active disease. Electronically Signed  By: Rolm Baptise M.D.   On: 10/26/2017 19:03    EKG: Independently reviewed.  A. fib rate controlled.  Assessment/Plan Principal Problem:   Acute bronchitis Active Problems:   Hypothyroidism   Morbid obesity ([employer])   Essential hypertension   Atrial fibrillation ([place])    1. Acute bronchitis -patient is empirically placed on Solu-Medrol Pulmicort and Xopenex.  Patient already was started on Tamiflu follow-up pending influenza PCR.   2. A. fib -continue Cardizem metoprolol and apixaban. 3. Diastolic CHF last EF measured in May 2017 was 50-55%.  On Lasix 40 mg p.o. Daily. 4. Hypertension on ARB Cardizem and metoprolol. 5. Hypothyroidism on Synthroid. 6. History of diabetes mellitus per the chart patient is not on any medication.  I have placed patient on sliding scale coverage.   DVT prophylaxis: Apixaban. Code Status: Full code. Family Communication: Family at the bedside. Disposition Plan: Home. Consults called: None. Admission status: Observation.   Rise Patience MD [facility] Pager [phone].  If 7PM-7AM, please contact night-coverage [email or web address] Password  [facility]  10/27/2017, 6:27 AM

## 2017-10-27 NOTE — ED Notes (Signed)
Breakfast tray ordered 

## 2017-10-27 NOTE — ED Notes (Signed)
Pt CBG was 239, notified Chrislyn(RN)

## 2017-10-27 NOTE — ED Notes (Signed)
Pt taken off [place] per [place], MD. Pt O2 went down to 80's. Pt placed back on 3L [place]. Betsey Holiday, MD notified.

## 2017-10-27 NOTE — ED Provider Notes (Signed)
[place] EMERGENCY DEPARTMENT Provider Note   CSN: [id number] Arrival date & time: 10/26/17  1707     History   Chief Complaint Chief Complaint  Patient presents with  . Cough    HPI [name] Case is a 84 y.o. female.  Patient presents to the emergency department for evaluation of difficulty breathing.  Patient reports that she has been sick for the last 2 days.  She has had sinus congestion and cough.  Cough has been productive of thick sputum.  She reports that she has become more short of breath tonight, feeling a tightness in her lungs when she breathes.  This is what brought her to the ER tonight.  She does not have a history of emphysema or asthma, but does report a history of "bronchitis".  It      Past Medical History:  Diagnosis Date  . Allergy   . Asthma   . Atrial fibrillation ([place])   . Bronchitis   . CHF (congestive heart failure) ([place])   . Diabetes mellitus without complication ([place])   . GERD (gastroesophageal reflux disease)   . Hyperlipidemia   . Hypertension   . Thyroid disease     Patient Active Problem List   Diagnosis Date Noted  . Lichen planus 05/39/7673  . Acute respiratory failure with hypoxia ([place])   . Obesity hypoventilation syndrome ([place])   . Hypokalemia   . Chronic CHF (congestive heart failure) ([place]) 01/09/2016  . Atrial fibrillation with RVR ([place]) 01/09/2016  . Dyspnea 05/28/2015  . History of snoring 05/20/2015  . Chronic respiratory failure with hypercapnia ([place])   . Type 2 diabetes mellitus without complication ([place])   . Atrial fibrillation ([place]) 01/06/2015  . Hypothyroidism 03/09/2007  . Hyperlipidemia 03/09/2007  . Morbid obesity ([place]) 03/09/2007  . Essential hypertension 03/09/2007  . ALLERGIC RHINITIS 03/09/2007  . Asthma 03/09/2007  . GERD 03/09/2007  . INCONTINENCE 03/09/2007    Past Surgical History:  Procedure Laterality Date  . ABDOMINAL HYSTERECTOMY    . APPENDECTOMY    .  CHOLECYSTECTOMY    . TOTAL KNEE ARTHROPLASTY      OB History    No data available       Home Medications    Prior to Admission medications   Medication Sig Start Date End Date Taking? Authorizing Provider  apixaban (ELIQUIS) 5 MG TABS tablet Take 1 tablet (5 mg total) by mouth 2 (two) times daily. 07/28/16  Yes Jerline Pain, MD  atorvastatin (LIPITOR) 20 MG tablet TAKE 1 TABLET BY MOUTH DAILY AT 6PM 07/01/17  Yes Hoyt Koch, MD  diltiazem (CARDIZEM CD) 180 MG 24 hr capsule Take 1 capsule (180 mg total) by mouth daily. 10/15/17  Yes Hoyt Koch, MD  furosemide (LASIX) 80 MG tablet TAKE 1/2 TABLET BY MOUTH DAILY 10/05/17  Yes Hoyt Koch, MD  irbesartan (AVAPRO) 150 MG tablet Take 0.5 tablets (75 mg total) by mouth daily. 06/03/17  Yes Hoyt Koch, MD  levothyroxine (SYNTHROID, LEVOTHROID) 100 MCG tablet TAKE 1 TABLET BY MOUTH ONCE DAILY 12/03/16  Yes Hoyt Koch, MD  metoprolol succinate (TOPROL-XL) 50 MG 24 hr tablet TAKE 1 TABLET BY MOUTH DAILY, TAKE WITH OR IMMEDIATELY FOLLOWING A MEAL 08/03/17  Yes Jerline Pain, MD  potassium chloride (K-DUR) 10 MEQ tablet Take 1 tablet (10 mEq total) by mouth daily. 08/20/17  Yes Jerline Pain, MD  clotrimazole-betamethasone (LOTRISONE) cream APPLY TOPICALLY TWICE A DAY Patient not taking:  Reported on 10/27/2017 09/17/17   Hoyt Koch, MD    Family History Family History  Problem Relation Age of Onset  . Heart disease Mother   . Colon cancer Mother     Social History Social History   Tobacco Use  . Smoking status: Never Smoker  . Smokeless tobacco: Never Used  Substance Use Topics  . Alcohol use: No    Alcohol/week: 0.0 oz  . Drug use: No     Allergies   Codeine   Review of Systems Review of Systems  HENT: Positive for congestion.   Respiratory: Positive for cough, chest tightness and shortness of breath.   All other systems reviewed and are negative.    Physical  Exam Updated Vital Signs BP 124/70   Pulse 87   Temp 98.6 F (37 C) (Oral)   Resp 17   Ht 5' 0.75" (1.543 m)   Wt 97.5 kg (215 lb)   LMP  (LMP Unknown)   SpO2 96%   BMI 40.96 kg/m   Physical Exam  Constitutional: She is oriented to person, place, and time. She appears well-developed and well-nourished. No distress.  HENT:  Head: Normocephalic and atraumatic.  Right Ear: Hearing normal.  Left Ear: Hearing normal.  Nose: Nose normal.  Mouth/Throat: Oropharynx is clear and moist and mucous membranes are normal.  Eyes: Conjunctivae and EOM are normal. Pupils are equal, round, and reactive to light.  Neck: Normal range of motion. Neck supple.  Cardiovascular: S1 normal and S2 normal. An irregularly irregular rhythm present. Exam reveals no gallop and no friction rub.  No murmur heard. Pulmonary/Chest: Effort normal. No respiratory distress. She has decreased breath sounds. She has wheezes. She exhibits no tenderness.  Abdominal: Soft. Normal appearance and bowel sounds are normal. There is no hepatosplenomegaly. There is no tenderness. There is no rebound, no guarding, no tenderness at McBurney's point and negative Murphy's sign. No hernia.  Musculoskeletal: Normal range of motion.  Neurological: She is alert and oriented to person, place, and time. She has normal strength. No cranial nerve deficit or sensory deficit. Coordination normal. GCS eye subscore is 4. GCS verbal subscore is 5. GCS motor subscore is 6.  Skin: Skin is warm, dry and intact. No rash noted. No cyanosis.  Psychiatric: She has a normal mood and affect. Her speech is normal and behavior is normal. Thought content normal.  Nursing note and vitals reviewed.    ED Treatments / Results  Labs (all labs ordered are listed, but only abnormal results are displayed) Labs Reviewed  BASIC METABOLIC PANEL - Abnormal; Notable for the following components:      Result Value   Glucose, Bld 128 (*)    All other components  within normal limits  BRAIN NATRIURETIC PEPTIDE - Abnormal; Notable for the following components:   B Natriuretic Peptide 247.9 (*)    All other components within normal limits  CBC WITH DIFFERENTIAL/PLATELET  INFLUENZA PANEL BY PCR (TYPE A & B)  I-STAT TROPONIN, ED    EKG  EKG Interpretation  Date/Time:  Tuesday October 27 2017 01:26:08 EST Ventricular Rate:  86 PR Interval:    QRS Duration: 88 QT Interval:  337 QTC Calculation: 403 R Axis:   58 Text Interpretation:  Atrial fibrillation Ventricular premature complex Low voltage, precordial leads Borderline repolarization abnormality No significant change since last tracing Confirmed by Orpah Greek [id number]) on 10/27/2017 1:38:47 AM       Radiology Dg Chest 2 View  Result Date:  10/26/2017 CLINICAL DATA:  Productive cough EXAM: CHEST  2 VIEW COMPARISON:  02/21/2017 FINDINGS: Mild cardiomegaly. Left base atelectasis or scarring, stable. No confluent opacity on the right. No effusions. No acute bony abnormality. IMPRESSION: Mild cardiomegaly. Left base scarring or atelectasis. No active disease. Electronically Signed   By: Rolm Baptise M.D.   On: 10/26/2017 19:03    Procedures Procedures (including critical care time)  Medications Ordered in ED Medications  oseltamivir (TAMIFLU) capsule 75 mg (not administered)  ipratropium-albuterol (DUONEB) 0.5-2.5 (3) MG/3ML nebulizer solution 3 mL (not administered)  ipratropium-albuterol (DUONEB) 0.5-2.5 (3) MG/3ML nebulizer solution 3 mL (3 mLs Nebulization Given 10/27/17 0126)  methylPREDNISolone sodium succinate (SOLU-MEDROL) 125 mg/2 mL injection 60 mg (60 mg Intravenous Given 10/27/17 0228)     Initial Impression / Assessment and Plan / ED Course  I have reviewed the triage vital signs and the nursing notes.  Pertinent labs & imaging results that were available during my care of the patient were reviewed by me and considered in my medical decision making (see chart for  details).     Patient presents to the ER for evaluation of cough, fever and nasal congestion that has been ongoing for 2 days.  Patient reports that her breathing feels tight, cannot catch her breath.  Cough is productive of thick sputum.  Chest x-ray does not show pneumonia or volume overload.  Examination revealed mild wheezing.  This worsened with albuterol treatment.  Patient has a new oxygen requirement.  Lying at rest on room air she drops into the 80s.  We have not attempted to exert her but because of this.  She will require hospitalization for further management of acute bronchospasm with likely viral upper respiratory infection/bronchitis.  Will cover empirically for influenza.  Final Clinical Impressions(s) / ED Diagnoses   Final diagnoses:  Bronchitis  Acute bronchospasm    ED Discharge Orders    None       Orpah Greek, MD 10/27/17 [phone]

## 2017-10-27 NOTE — Progress Notes (Signed)
Patient seen and evaluated this morning, admitted overnight by Dr. Glyn Ade, H&P reviewed and agree with the assessment and plan.  In brief, this is an 84 year old female with history of A. fib on chronic anticoagulation, diastolic CHF, hypothyroidism, hypertension, asthma/bronchitis, diabetes mellitus who presented to the emergency room with a persistent cough, weakness, shortness of breath and wheezing over the last 2-3 days.  Patient tells me that she has felt more short of breath after she was sitting near a fireplace at home.  This however happened more than a week ago.  In the ED here however she was found to have a low-grade temp and there was concern for influenza like illness. While I was in the ED, patient's granddaughter was at bedside tells me that a 2 other family members are coming to the hospital for evaluation given fever as well as URI type symptoms.   Acute bronchitis with bronchospasm and acute hypoxic respiratory failure -Patient was placed empirically on steroids, nebulizers, and empiric Tamiflu -Influenza PCR was negative, discontinue Tamiflu and continue supportive treatment -Chest x-ray without infiltrates -Wean off oxygen as tolerated, it seems like patient is still desaturating on room air, and she still appears to have wheezing on exam  Chronic A. fib -Rate control with Cardizem and metoprolol, continue apixaban for anticoagulation  Chronic diastolic CHF -She appears euvolemic on exam, most recent EF was in May 2000 1750-55% -Continue home dose of Lasix daily  Hypertension -Continue ARB, Cardizem and metoprolol  Hypothyroidism -Continue Synthroid  History of diabetes mellitus -Appears to be diet controlled, continue sliding scale   Costin M. Cruzita Lederer, MD [facility] [phone]  If 7PM-7AM, please contact night-coverage [email or web address] Password [email or web address]

## 2017-10-27 NOTE — ED Notes (Signed)
ED Provider at bedside. 

## 2017-10-28 ENCOUNTER — Telehealth: Payer: Self-pay | Admitting: *Deleted

## 2017-10-28 DIAGNOSIS — I10 Essential (primary) hypertension: Secondary | ICD-10-CM

## 2017-10-28 DIAGNOSIS — E038 Other specified hypothyroidism: Secondary | ICD-10-CM | POA: Diagnosis not present

## 2017-10-28 DIAGNOSIS — J441 Chronic obstructive pulmonary disease with (acute) exacerbation: Secondary | ICD-10-CM | POA: Diagnosis not present

## 2017-10-28 DIAGNOSIS — I480 Paroxysmal atrial fibrillation: Secondary | ICD-10-CM

## 2017-10-28 LAB — BASIC METABOLIC PANEL
Anion gap: 10 (ref 5–15)
BUN: 19 mg/dL (ref 6–20)
CO2: 24 mmol/L (ref 22–32)
CREATININE: 0.85 mg/dL (ref 0.44–1.00)
Calcium: 9.2 mg/dL (ref 8.9–10.3)
Chloride: 103 mmol/L (ref 101–111)
GFR calc Af Amer: >60 mL/min (ref >60)
GFR calc non Af Amer: >60 mL/min (ref >60)
GLUCOSE: 170 mg/dL — AB (ref 65–99)
POTASSIUM: 4.2 mmol/L (ref 3.5–5.1)
Sodium: 137 mmol/L (ref 135–145)

## 2017-10-28 LAB — CBC
HEMATOCRIT: 37.8 % (ref 36.0–46.0)
Hemoglobin: 12 g/dL (ref 12.0–15.0)
MCH: 31.3 pg (ref 26.0–34.0)
MCHC: 31.7 g/dL (ref 30.0–36.0)
MCV: 98.7 fL (ref 78.0–100.0)
PLATELETS: 206 10*3/uL (ref 150–400)
RBC: 3.83 MIL/uL — ABNORMAL LOW (ref 3.87–5.11)
RDW: 14.7 % (ref 11.5–15.5)
WBC: 13.6 10*3/uL — AB (ref 4.0–10.5)

## 2017-10-28 LAB — GLUCOSE, CAPILLARY
Glucose-Capillary: 167 mg/dL — ABNORMAL HIGH (ref 65–99)
Glucose-Capillary: 210 mg/dL — ABNORMAL HIGH (ref 65–99)

## 2017-10-28 MED ORDER — PREDNISONE 20 MG PO TABS: 40.0000 mg | ORAL_TABLET | Freq: Every day | ORAL | Status: DC

## 2017-10-28 MED ORDER — ORAL CARE MOUTH RINSE
15.0000 mL | Freq: Two times a day (BID) | OROMUCOSAL | Status: DC
  Administered 2017-10-28: 15 mL via OROMUCOSAL

## 2017-10-28 MED ORDER — LEVALBUTEROL HCL 0.63 MG/3ML IN NEBU
0.6300 mg | INHALATION_SOLUTION | Freq: Two times a day (BID) | RESPIRATORY_TRACT | Status: DC
  Administered 2017-10-28: 0.63 mg via RESPIRATORY_TRACT
  Filled 2017-10-28: qty 3

## 2017-10-28 MED ORDER — PREDNISONE 20 MG PO TABS: 40.0000 mg | ORAL_TABLET | Freq: Every day | ORAL | 0 refills | Status: DC

## 2017-10-28 MED ORDER — ALBUTEROL SULFATE (2.5 MG/3ML) 0.083% IN NEBU: 2.5000 mg | INHALATION_SOLUTION | Freq: Four times a day (QID) | RESPIRATORY_TRACT | 1 refills | Status: DC | PRN

## 2017-10-28 NOTE — Plan of Care (Signed)
  Education: Knowledge of General Education information will improve 10/28/2017 0834 - Progressing by Imagene Gurney, RN

## 2017-10-28 NOTE — Progress Notes (Signed)
SATURATION QUALIFICATIONS: (This note is used to comply with regulatory documentation for home oxygen)  Patient Saturations on Room Air at Rest = 95%  Patient Saturations on Room Air while Ambulating = 91%  Patient Saturations on 0 Liters of oxygen while Ambulating = 91%  Please briefly explain why patient needs home oxygen:  Pt does not qualify for home O2 as her sats did not drop below 91%.

## 2017-10-28 NOTE — Telephone Encounter (Signed)
Transition Care Management Follow-up Telephone Call   Date discharged? 10/28/17   How have you been since you were released from the hospital? Pt states she is doing ok...still coughing   Do you understand why you were in the hospital? YES   Do you understand the discharge instructions? YES   Where were you discharged to? Home   Items Reviewed:  Medications reviewed: YES  Allergies reviewed: YES  Dietary changes reviewed: NO  Referrals reviewed: NO REFERRAL NEEDED   Functional Questionnaire:   Activities of Daily Living (ADLs):   She states She are independent in the following: ambulation, bathing and hygiene, feeding, continence, grooming, toileting and dressing States She doesn't require assistance    Any transportation issues/concerns?: NO   Any patient concerns? NO   Confirmed importance and date/time of follow-up visits scheduled YES, appt 11/04/17 Provider Appointment booked with  Dr. Sharlet Salina  Confirmed with patient if condition begins to worsen call PCP or go to the ER.  Patient was given the office number and encouraged to call back with question or concerns.  : YES

## 2017-10-29 NOTE — Discharge Summary (Signed)
Physician Discharge Summary  [name] Case ZOX:[id number] DOB: 03-22-1932 DOA: 10/27/2017  PCP: [name] Koch, MD  Admit date: 10/27/2017 Discharge date: 10/29/2017  Admitted From: Home  Disposition:  Home   Recommendations for Outpatient Follow-up:  1. Follow up with PCP in 1 weeks 2. Refer for sleep study as needed   Home Health: None  Equipment/Devices: None  Discharge Condition: Good  CODE STATUS: FULL    Brief/Interim Summary: [name] Case is a 84 yo F with Afib on anticoagulation, HFpEF, hypothyroidism, HTN, and DM who presents with several days worsening cough, dyspnea and hypoxia.  Found to have normal CXR, very wheezy, started on steroids for COPD flare.     1. Presumed COPD exacerbation Patient reports chronic SOB with exertion due to her CHF.  States that she was recently exposed to smoker, and her symptoms gradually came on after that (SOB, wheezing, cough, feeling OOB).  In the ER she was hypoxic and wheezing, felt improvement overnight with bronchodilators and steroids.  Flu PCR negative.  Today weaned off O2. -Discharged with steroids burst -Patient was previously on Symbicort (follows with [name] Case, PFTs two years ago with borderline but not obstructive, FEV1/FVC 0.74), but stopped taking Symbicort because she lost the inhaler -Will review with PCP about whether to take Symbicort  2. Chronic A. Fib Rate control was continued, apixaban was continued.  3. Chronic diastolic CHF Lasix was continued, she appeared euvolemic.  4. Hypertension BP meds were continued.  5. Hypothyroidism Levothyroxine was continued.  6. Diabetes Sugars were well controlled even on steroids.  7. Obesity Patient wil review with her PCP need for sleep study   Discharge Diagnoses:  Principal Problem:   Acute bronchitis Active Problems:   Hypothyroidism   Morbid obesity ([place])   Essential hypertension   Atrial fibrillation [facility])    Discharge  Instructions  Discharge Instructions    Diet Carb Modified   Complete by:  As directed    Discharge instructions   Complete by:  As directed    From Dr. Loleta Case: You were admitted for trouble breathing and low oxygen levels from what we believe was a COPD flare.  You were treated with steroids and albuterol and oxygen.  Luckily, you have gotten better.  You no longer need oxygen.   You should finish your steroids: Take prednisone 40 mg (2 tabs) each day in the morning for 5 more days then stop Use albuterol as needed (either in the nebulizer machine, or with a proventil puffer). Follow up with [name] Case in the next week, and ask her about:     -Do you need to go back on Symbicort?     -Do you need testing for sleep apnea?   Return to the hospital if you develop much worse breathing that doesn't get better with albuterol, if you get chest pain, or if you get a new fever.   Increase activity slowly   Complete by:  As directed      Allergies as of 10/28/2017      Reactions   Codeine Hives, Rash   In cough syrup preparations      Medication List    STOP taking these medications   clotrimazole-betamethasone cream Commonly known as:  LOTRISONE     TAKE these medications   albuterol (2.5 MG/3ML) 0.083% nebulizer solution Commonly known as:  PROVENTIL Take 3 mLs (2.5 mg total) by nebulization every 6 (six) hours as needed for wheezing or shortness of breath.   apixaban  5 MG Tabs tablet Commonly known as:  ELIQUIS Take 1 tablet (5 mg total) by mouth 2 (two) times daily.   atorvastatin 20 MG tablet Commonly known as:  LIPITOR TAKE 1 TABLET BY MOUTH DAILY AT 6PM   diltiazem 180 MG 24 hr capsule Commonly known as:  CARDIZEM CD Take 1 capsule (180 mg total) by mouth daily.   furosemide 80 MG tablet Commonly known as:  LASIX TAKE 1/2 TABLET BY MOUTH DAILY   irbesartan 150 MG tablet Commonly known as:  AVAPRO Take 0.5 tablets (75 mg total) by mouth daily.    levothyroxine 100 MCG tablet Commonly known as:  SYNTHROID, LEVOTHROID TAKE 1 TABLET BY MOUTH ONCE DAILY   metoprolol succinate 50 MG 24 hr tablet Commonly known as:  TOPROL-XL TAKE 1 TABLET BY MOUTH DAILY, TAKE WITH OR IMMEDIATELY FOLLOWING A MEAL   potassium chloride 10 MEQ tablet Commonly known as:  K-DUR Take 1 tablet (10 mEq total) by mouth daily.   predniSONE 20 MG tablet Commonly known as:  DELTASONE Take 2 tablets (40 mg total) by mouth daily with breakfast.      Follow-up Information    [name] Koch, MD On 11/04/2017.   Specialty:  Internal Medicine Why:  At 9:00 AM. Patient needs to arrive at 08:45 AM. Contact information: [place] [address] [phone]          Allergies  Allergen Reactions  . Codeine Hives and Rash    In cough syrup preparations    Consultations:  None   Procedures/Studies: Dg Chest 2 View  Result Date: 10/26/2017 CLINICAL DATA:  Productive cough EXAM: CHEST  2 VIEW COMPARISON:  02/21/2017 FINDINGS: Mild cardiomegaly. Left base atelectasis or scarring, stable. No confluent opacity on the right. No effusions. No acute bony abnormality. IMPRESSION: Mild cardiomegaly. Left base scarring or atelectasis. No active disease. Electronically Signed   By: Rolm Baptise M.D.   On: 10/26/2017 19:03       Subjective: Feels better.  Chest less tight, wheezing improved.  No cough, no chest pain, no fever.  Discharge Exam: Vitals:   10/28/17 0747 10/28/17 1141  BP:  (!) 115/55  Pulse:  92  Resp:  18  Temp:  97.6 F (36.4 C)  SpO2: 96% 95%   Vitals:   10/28/17 0454 10/28/17 0740 10/28/17 0747 10/28/17 1141  BP: 123/70   (!) 115/55  Pulse: 96 87  92  Resp: 18 16  18   Temp: (!) 97.4 F (36.3 C)   97.6 F (36.4 C)  TempSrc: Oral   Oral  SpO2: 98% 96% 96% 95%  Weight: 100 kg (220 lb 8 oz)     Height:        General: Pt is alert, awake, not in acute distress Cardiovascular: RRR, S1/S2 +, no rubs, no  gallops Respiratory: Wheezes bilaterally, no rales, respiratory effort event Abdominal: Soft, NT, ND, bowel sounds + Extremities: no edema, no cyanosis    The results of significant diagnostics from this hospitalization (including imaging, microbiology, ancillary and laboratory) are listed below for reference.     Microbiology: No results found for this or any previous visit (from the past 240 hour(s)).   Labs: BNP (last 3 results) Recent Labs    02/04/17 2130 02/21/17 2032 10/27/17 0131  BNP 189.3* 217.4* 169.6*   Basic Metabolic Panel: Recent Labs  Lab 10/27/17 0131 10/28/17 0440  NA 139 137  K 3.5 4.2  CL 101 103  CO2 25  24  GLUCOSE 128* 170*  BUN 11 19  CREATININE 0.81 0.85  CALCIUM 9.0 9.2   Liver Function Tests: No results for input(s): AST, ALT, ALKPHOS, BILITOT, PROT, ALBUMIN in the last 168 hours. No results for input(s): LIPASE, AMYLASE in the last 168 hours. No results for input(s): AMMONIA in the last 168 hours. CBC: Recent Labs  Lab 10/27/17 0131 10/28/17 0440  WBC 7.0 13.6*  NEUTROABS 5.3  --   HGB 12.9 12.0  HCT 39.7 37.8  MCV 97.3 98.7  PLT 216 206   Cardiac Enzymes: No results for input(s): CKTOTAL, CKMB, CKMBINDEX, TROPONINI in the last 168 hours. BNP: Invalid input(s): POCBNP CBG: Recent Labs  Lab 10/27/17 1305 10/27/17 1645 10/27/17 2100 10/28/17 0740 10/28/17 1136  GLUCAP 235* 190* 194* 167* 210*   D-Dimer No results for input(s): DDIMER in the last 72 hours. Hgb A1c No results for input(s): HGBA1C in the last 72 hours. Lipid Profile No results for input(s): CHOL, HDL, LDLCALC, TRIG, CHOLHDL, LDLDIRECT in the last 72 hours. Thyroid function studies No results for input(s): TSH, T4TOTAL, T3FREE, THYROIDAB in the last 72 hours.  Invalid input(s): FREET3 Anemia work up No results for input(s): VITAMINB12, FOLATE, FERRITIN, TIBC, IRON, RETICCTPCT in the last 72 hours. Urinalysis    Component Value Date/Time   COLORURINE  YELLOW 01/20/2015 [place] 01/20/2015 1153   LABSPEC 1.008 01/20/2015 1153   PHURINE 6.0 01/20/2015 1153   GLUCOSEU NEGATIVE 01/20/2015 1153   HGBUR LARGE (A) 01/20/2015 1153   BILIRUBINUR NEGATIVE 01/20/2015 1153   BILIRUBINUR n 07/01/2012 1115   KETONESUR NEGATIVE 01/20/2015 1153   PROTEINUR NEGATIVE 01/20/2015 1153   UROBILINOGEN 0.2 01/20/2015 1153   NITRITE NEGATIVE 01/20/2015 1153   LEUKOCYTESUR MODERATE (A) 01/20/2015 1153   Sepsis Labs Invalid input(s): PROCALCITONIN,  WBC,  LACTICIDVEN Microbiology No results found for this or any previous visit (from the past 240 hour(s)).   Time coordinating discharge: over 30 minutes  SIGNED:   Edwin Dada, MD  [facility] 10/29/2017, 5:57 AM

## 2017-11-04 ENCOUNTER — Encounter: Payer: Self-pay | Admitting: Internal Medicine

## 2017-11-04 ENCOUNTER — Ambulatory Visit (INDEPENDENT_AMBULATORY_CARE_PROVIDER_SITE_OTHER): Payer: Medicare Other | Admitting: Internal Medicine

## 2017-11-04 VITALS — BP 120/70 | HR 85 | Temp 97.6°F | Ht 60.75 in | Wt 220.0 lb

## 2017-11-04 DIAGNOSIS — J9601 Acute respiratory failure with hypoxia: Secondary | ICD-10-CM

## 2017-11-04 DIAGNOSIS — I10 Essential (primary) hypertension: Secondary | ICD-10-CM | POA: Diagnosis not present

## 2017-11-04 DIAGNOSIS — J4520 Mild intermittent asthma, uncomplicated: Secondary | ICD-10-CM | POA: Diagnosis not present

## 2017-11-04 MED ORDER — APIXABAN 5 MG PO TABS: 5.0000 mg | ORAL_TABLET | Freq: Two times a day (BID) | ORAL | 3 refills | Status: DC

## 2017-11-04 NOTE — Assessment & Plan Note (Signed)
Mild intermittent without flare currently. Using albuterol prn only right now. She denies any SOB with normal activities and denies changing activities due to SOB. No indication for maintenance medication at this time.

## 2017-11-04 NOTE — Assessment & Plan Note (Signed)
Taking irbesartan, metoprolol, lasix, diltiazem with BP at goal. Denies dizziness with treatment. BMP without indication for change in the hospital.

## 2017-11-04 NOTE — Patient Instructions (Signed)
We have sent in the eliquis refill to the pharmacy.  Keep working on staying active to keep the lungs strong.

## 2017-11-04 NOTE — Progress Notes (Signed)
   Subjective:    Patient ID: [name] Case, female    DOB: 1932-08-14, 84 y.o.   MRN: [id number]  HPI The patient is an 84 YO female coming in for hospital follow up (in for bronchitis and treated with steroids with good resolution). She has COPD and has not been taking her symbicort regularly. She was exposed to smoke and this caused her to have flare. She did have hypoxia in the hospital and was able to transition off oxygen prior to discharge. She feels that she is doing well now. She has not used the albuterol nebulizer in about 2 days. Feels some tired but much better. She is able to do all activities around the house without getting SOB. She is able to do mild cleaning without breathlessness. Denies fevers or chills. Mild cough still but improving. Is off the prednisone now. Denies new problems such as chest pain, palpitations, SOB, abdominal pain, diarrhea/nausea/vomiting/constipation.   PMH, [facility], social history reviewed and updated.   Review of Systems  Constitutional: Positive for activity change. Negative for appetite change, chills, fatigue, fever and unexpected weight change.  HENT: Negative.   Eyes: Negative.   Respiratory: Positive for cough. Negative for chest tightness and shortness of breath.   Cardiovascular: Negative for chest pain, palpitations and leg swelling.  Gastrointestinal: Negative for abdominal distention, abdominal pain, constipation, diarrhea, nausea and vomiting.  Musculoskeletal: Negative.   Skin: Negative.   Neurological: Negative.   Psychiatric/Behavioral: Negative.       Objective:   Physical Exam  Constitutional: She is oriented to person, place, and time. She appears well-developed and well-nourished.  Overweight  HENT:  Head: Normocephalic and atraumatic.  Eyes: EOM are normal.  Neck: Normal range of motion.  Cardiovascular: Normal rate and regular rhythm.  Pulmonary/Chest: Effort normal and breath sounds normal. No respiratory distress. She  has no wheezes. She has no rales.  Abdominal: Soft. Bowel sounds are normal. She exhibits no distension. There is no tenderness. There is no rebound.  Musculoskeletal: She exhibits no edema.  Neurological: She is alert and oriented to person, place, and time. Coordination normal.  Skin: Skin is warm and dry.  Psychiatric:  Some poor hygiene   Vitals:   11/04/17 0906  BP: 120/70  Pulse: 85  Temp: 97.6 F (36.4 C)  TempSrc: Oral  SpO2: 97%  Weight: 220 lb (99.8 kg)  Height: 5' 0.75" (1.543 m)      Assessment & Plan:

## 2017-11-04 NOTE — Assessment & Plan Note (Signed)
Resolved at this time. She is using albuterol nebulizer prn. Doing most normal activities.

## 2017-12-01 ENCOUNTER — Emergency Department (HOSPITAL_COMMUNITY): Payer: Medicare Other

## 2017-12-01 ENCOUNTER — Ambulatory Visit: Payer: Medicare Other | Admitting: Internal Medicine

## 2017-12-01 ENCOUNTER — Emergency Department (HOSPITAL_COMMUNITY)
Admission: EM | Admit: 2017-12-01 | Discharge: 2017-12-01 | Disposition: A | Discharge status: Home / Self Care | Payer: Medicare Other | Source: Home / Self Care | Attending: Emergency Medicine | Admitting: Emergency Medicine

## 2017-12-01 ENCOUNTER — Encounter (HOSPITAL_COMMUNITY): Payer: Self-pay | Admitting: Emergency Medicine

## 2017-12-01 DIAGNOSIS — J45909 Unspecified asthma, uncomplicated: Secondary | ICD-10-CM

## 2017-12-01 DIAGNOSIS — E119 Type 2 diabetes mellitus without complications: Secondary | ICD-10-CM | POA: Insufficient documentation

## 2017-12-01 DIAGNOSIS — I110 Hypertensive heart disease with heart failure: Secondary | ICD-10-CM

## 2017-12-01 DIAGNOSIS — Z66 Do not resuscitate: Secondary | ICD-10-CM | POA: Diagnosis not present

## 2017-12-01 DIAGNOSIS — Z87442 Personal history of urinary calculi: Secondary | ICD-10-CM | POA: Diagnosis not present

## 2017-12-01 DIAGNOSIS — I10 Essential (primary) hypertension: Secondary | ICD-10-CM | POA: Diagnosis not present

## 2017-12-01 DIAGNOSIS — N200 Calculus of kidney: Secondary | ICD-10-CM

## 2017-12-01 DIAGNOSIS — R1084 Generalized abdominal pain: Secondary | ICD-10-CM | POA: Diagnosis not present

## 2017-12-01 DIAGNOSIS — E039 Hypothyroidism, unspecified: Secondary | ICD-10-CM

## 2017-12-01 DIAGNOSIS — N179 Acute kidney failure, unspecified: Secondary | ICD-10-CM | POA: Diagnosis not present

## 2017-12-01 DIAGNOSIS — E038 Other specified hypothyroidism: Secondary | ICD-10-CM | POA: Diagnosis not present

## 2017-12-01 DIAGNOSIS — Z79890 Hormone replacement therapy (postmenopausal): Secondary | ICD-10-CM | POA: Diagnosis not present

## 2017-12-01 DIAGNOSIS — R1114 Bilious vomiting: Secondary | ICD-10-CM

## 2017-12-01 DIAGNOSIS — R031 Nonspecific low blood-pressure reading: Secondary | ICD-10-CM | POA: Diagnosis not present

## 2017-12-01 DIAGNOSIS — N390 Urinary tract infection, site not specified: Secondary | ICD-10-CM | POA: Diagnosis not present

## 2017-12-01 DIAGNOSIS — J9612 Chronic respiratory failure with hypercapnia: Secondary | ICD-10-CM | POA: Diagnosis not present

## 2017-12-01 DIAGNOSIS — E662 Morbid (severe) obesity with alveolar hypoventilation: Secondary | ICD-10-CM | POA: Diagnosis not present

## 2017-12-01 DIAGNOSIS — I509 Heart failure, unspecified: Secondary | ICD-10-CM

## 2017-12-01 DIAGNOSIS — Z7901 Long term (current) use of anticoagulants: Secondary | ICD-10-CM | POA: Diagnosis not present

## 2017-12-01 DIAGNOSIS — Z885 Allergy status to narcotic agent status: Secondary | ICD-10-CM | POA: Diagnosis not present

## 2017-12-01 DIAGNOSIS — E785 Hyperlipidemia, unspecified: Secondary | ICD-10-CM | POA: Diagnosis not present

## 2017-12-01 DIAGNOSIS — R319 Hematuria, unspecified: Secondary | ICD-10-CM | POA: Diagnosis not present

## 2017-12-01 DIAGNOSIS — I480 Paroxysmal atrial fibrillation: Secondary | ICD-10-CM | POA: Diagnosis not present

## 2017-12-01 DIAGNOSIS — Z9049 Acquired absence of other specified parts of digestive tract: Secondary | ICD-10-CM | POA: Diagnosis not present

## 2017-12-01 DIAGNOSIS — Z8249 Family history of ischemic heart disease and other diseases of the circulatory system: Secondary | ICD-10-CM | POA: Diagnosis not present

## 2017-12-01 DIAGNOSIS — Z79899 Other long term (current) drug therapy: Secondary | ICD-10-CM

## 2017-12-01 DIAGNOSIS — N201 Calculus of ureter: Secondary | ICD-10-CM | POA: Diagnosis not present

## 2017-12-01 DIAGNOSIS — I5032 Chronic diastolic (congestive) heart failure: Secondary | ICD-10-CM | POA: Diagnosis not present

## 2017-12-01 DIAGNOSIS — R109 Unspecified abdominal pain: Secondary | ICD-10-CM | POA: Diagnosis not present

## 2017-12-01 DIAGNOSIS — D649 Anemia, unspecified: Secondary | ICD-10-CM | POA: Diagnosis not present

## 2017-12-01 DIAGNOSIS — R112 Nausea with vomiting, unspecified: Secondary | ICD-10-CM | POA: Diagnosis not present

## 2017-12-01 DIAGNOSIS — R1111 Vomiting without nausea: Secondary | ICD-10-CM | POA: Diagnosis not present

## 2017-12-01 DIAGNOSIS — Z96659 Presence of unspecified artificial knee joint: Secondary | ICD-10-CM | POA: Diagnosis not present

## 2017-12-01 DIAGNOSIS — R110 Nausea: Secondary | ICD-10-CM | POA: Diagnosis not present

## 2017-12-01 DIAGNOSIS — N136 Pyonephrosis: Secondary | ICD-10-CM | POA: Diagnosis not present

## 2017-12-01 LAB — CBC
HEMATOCRIT: 41.5 % (ref 36.0–46.0)
Hemoglobin: 13.5 g/dL (ref 12.0–15.0)
MCH: 31.9 pg (ref 26.0–34.0)
MCHC: 32.5 g/dL (ref 30.0–36.0)
MCV: 98.1 fL (ref 78.0–100.0)
Platelets: 251 10*3/uL (ref 150–400)
RBC: 4.23 MIL/uL (ref 3.87–5.11)
RDW: 14.6 % (ref 11.5–15.5)
WBC: 7.7 10*3/uL (ref 4.0–10.5)

## 2017-12-01 LAB — COMPREHENSIVE METABOLIC PANEL
ALK PHOS: 76 U/L (ref 38–126)
ALT: 17 U/L (ref 14–54)
AST: 27 U/L (ref 15–41)
Albumin: 3.6 g/dL (ref 3.5–5.0)
Anion gap: 9 (ref 5–15)
BUN: 14 mg/dL (ref 6–20)
CHLORIDE: 105 mmol/L (ref 101–111)
CO2: 26 mmol/L (ref 22–32)
Calcium: 9 mg/dL (ref 8.9–10.3)
Creatinine, Ser: 0.92 mg/dL (ref 0.44–1.00)
GFR calc Af Amer: >60 mL/min (ref >60)
GFR calc non Af Amer: 56 mL/min — ABNORMAL LOW (ref >60)
GLUCOSE: 148 mg/dL — AB (ref 65–99)
POTASSIUM: 4.4 mmol/L (ref 3.5–5.1)
Sodium: 140 mmol/L (ref 135–145)
Total Bilirubin: 1.2 mg/dL (ref 0.3–1.2)
Total Protein: 7.1 g/dL (ref 6.5–8.1)

## 2017-12-01 LAB — URINALYSIS, ROUTINE W REFLEX MICROSCOPIC
Bacteria, UA: NONE SEEN
Bilirubin Urine: NEGATIVE
GLUCOSE, UA: NEGATIVE mg/dL
KETONES UR: NEGATIVE mg/dL
Leukocytes, UA: NEGATIVE
NITRITE: NEGATIVE
PH: 6 (ref 5.0–8.0)
PROTEIN: NEGATIVE mg/dL
Specific Gravity, Urine: 1.006 (ref 1.005–1.030)

## 2017-12-01 LAB — LIPASE, BLOOD: Lipase: 21 U/L (ref 11–51)

## 2017-12-01 MED ORDER — TRAMADOL HCL 50 MG PO TABS: 50.0000 mg | ORAL_TABLET | Freq: Four times a day (QID) | ORAL | 0 refills | Status: DC | PRN

## 2017-12-01 MED ORDER — ONDANSETRON HCL 4 MG/2ML IJ SOLN
4.0000 mg | Freq: Once | INTRAMUSCULAR | Status: AC
  Administered 2017-12-01: 4 mg via INTRAVENOUS
  Filled 2017-12-01: qty 2

## 2017-12-01 MED ORDER — FENTANYL CITRATE (PF) 100 MCG/2ML IJ SOLN
25.0000 ug | Freq: Once | INTRAMUSCULAR | Status: AC
  Administered 2017-12-01: 25 ug via INTRAVENOUS
  Filled 2017-12-01: qty 2

## 2017-12-01 MED ORDER — ONDANSETRON 4 MG PO TBDP
4.0000 mg | ORAL_TABLET | Freq: Once | ORAL | Status: AC
  Administered 2017-12-01: 4 mg via ORAL
  Filled 2017-12-01: qty 1

## 2017-12-01 MED ORDER — FENTANYL CITRATE (PF) 100 MCG/2ML IJ SOLN
50.0000 ug | Freq: Once | INTRAMUSCULAR | Status: AC
  Administered 2017-12-01: 50 ug via INTRAVENOUS
  Filled 2017-12-01: qty 2

## 2017-12-01 MED ORDER — FENTANYL CITRATE (PF) 100 MCG/2ML IJ SOLN
12.5000 ug | Freq: Once | INTRAMUSCULAR | Status: AC
  Administered 2017-12-01: 12.5 ug via INTRAVENOUS
  Filled 2017-12-01: qty 2

## 2017-12-01 MED ORDER — SODIUM CHLORIDE 0.9 % IV BOLUS
500.0000 mL | Freq: Once | INTRAVENOUS | Status: AC
  Administered 2017-12-01: 12:00:00 via INTRAVENOUS

## 2017-12-01 MED ORDER — KETOROLAC TROMETHAMINE 30 MG/ML IJ SOLN
10.0000 mg | Freq: Once | INTRAMUSCULAR | Status: AC
Start: 2017-12-01 — End: 2017-12-01
  Administered 2017-12-01: 9.9 mg via INTRAVENOUS
  Filled 2017-12-01: qty 1

## 2017-12-01 NOTE — ED Notes (Signed)
Pt had episode of vomiting x 4

## 2017-12-01 NOTE — ED Triage Notes (Signed)
Back pain and vomiting since 10 am  Has a cough

## 2017-12-01 NOTE — ED Provider Notes (Signed)
[facility] EMERGENCY DEPARTMENT Provider Note   CSN: [id number] Arrival date & time: 12/01/17  1117     History   Chief Complaint Chief Complaint  Patient presents with  . Abdominal Pain  . Emesis    HPI [name] Case is a 84 y.o. female.  HPI Patient presents with concern of right flank and suprapubic pain. Onset was 2 hours ago, sudden. Since onset the pain is radiated from the right flank towards the suprapubic region, and is now diffusely throughout the abdomen. There is associated nausea, vomiting.  When the pain itself is sharp, severe, waxing, waning severity. No medication taken for pain relief, nor vomiting. Patient was well prior to the onset, denies history of kidney stone, does have a history of kidney infection.  Past Medical History:  Diagnosis Date  . Allergy   . Asthma   . Atrial fibrillation ([place])   . Bronchitis   . CHF (congestive heart failure) ([place])   . Diabetes mellitus without complication ([place])   . GERD (gastroesophageal reflux disease)   . Hyperlipidemia   . Hypertension   . Thyroid disease     Patient Active Problem List   Diagnosis Date Noted  . Lichen planus 18/84/1660  . Acute respiratory failure with hypoxia ([place])   . Obesity hypoventilation syndrome ([place])   . Chronic CHF (congestive heart failure) ([place]) 01/09/2016  . Atrial fibrillation with RVR ([place]) 01/09/2016  . History of snoring 05/20/2015  . Chronic respiratory failure with hypercapnia ([place])   . Type 2 diabetes mellitus without complication ([place])   . Atrial fibrillation ([place]) 01/06/2015  . Hypothyroidism 03/09/2007  . Hyperlipidemia 03/09/2007  . Morbid obesity ([place]) 03/09/2007  . Essential hypertension 03/09/2007  . ALLERGIC RHINITIS 03/09/2007  . Asthma 03/09/2007  . GERD 03/09/2007  . INCONTINENCE 03/09/2007    Past Surgical History:  Procedure Laterality Date  . ABDOMINAL HYSTERECTOMY    . APPENDECTOMY    . CHOLECYSTECTOMY    . TOTAL KNEE  ARTHROPLASTY       OB History   None      Home Medications    Prior to Admission medications   Medication Sig Start Date End Date Taking? Authorizing Provider  albuterol (PROVENTIL) (2.5 MG/3ML) 0.083% nebulizer solution Take 3 mLs (2.5 mg total) by nebulization every 6 (six) hours as needed for wheezing or shortness of breath. 10/28/17   Danford, Suann Larry, MD  apixaban (ELIQUIS) 5 MG TABS tablet Take 1 tablet (5 mg total) by mouth 2 (two) times daily. 11/04/17   Hoyt Koch, MD  atorvastatin (LIPITOR) 20 MG tablet TAKE 1 TABLET BY MOUTH DAILY AT 6PM 07/01/17   Hoyt Koch, MD  diltiazem (CARDIZEM CD) 180 MG 24 hr capsule Take 1 capsule (180 mg total) by mouth daily. 10/15/17   Hoyt Koch, MD  furosemide (LASIX) 80 MG tablet TAKE 1/2 TABLET BY MOUTH DAILY 10/05/17   Hoyt Koch, MD  irbesartan (AVAPRO) 150 MG tablet Take 0.5 tablets (75 mg total) by mouth daily. 06/03/17   Hoyt Koch, MD  levothyroxine (SYNTHROID, LEVOTHROID) 100 MCG tablet TAKE 1 TABLET BY MOUTH ONCE DAILY 12/03/16   Hoyt Koch, MD  metoprolol succinate (TOPROL-XL) 50 MG 24 hr tablet TAKE 1 TABLET BY MOUTH DAILY, TAKE WITH OR IMMEDIATELY FOLLOWING A MEAL 08/03/17   Jerline Pain, MD  potassium chloride (K-DUR) 10 MEQ tablet Take 1 tablet (10 mEq total) by mouth daily. 08/20/17   Candee Furbish  C, MD    Family History Family History  Problem Relation Age of Onset  . Heart disease Mother   . Colon cancer Mother     Social History Social History   Tobacco Use  . Smoking status: Never Smoker  . Smokeless tobacco: Never Used  Substance Use Topics  . Alcohol use: No    Alcohol/week: 0.0 oz  . Drug use: No     Allergies   Codeine   Review of Systems Review of Systems  Constitutional:       Per HPI, otherwise negative  HENT:       Per HPI, otherwise negative  Respiratory:       Per HPI, otherwise negative  Cardiovascular:       Per HPI,  otherwise negative  Gastrointestinal: Positive for abdominal pain and nausea. Negative for vomiting.  Endocrine:       Negative aside from HPI  Genitourinary:       Neg aside from HPI   Musculoskeletal:       Per HPI, otherwise negative  Skin: Negative.   Neurological: Negative for syncope.     Physical Exam Updated Vital Signs BP 110/87 (BP Location: Left Arm)   Pulse (!) 104   Temp 97.9 F (36.6 C) (Oral)   Resp 18   Ht 5\' 3"  (1.6 m)   LMP  (LMP Unknown)   SpO2 98%   BMI 38.97 kg/m   Physical Exam  Constitutional: She is oriented to person, place, and time. She appears well-developed and well-nourished. No distress.  Obese elderly female awake, alert, answering questions appropriately  HENT:  Head: Normocephalic and atraumatic.  Eyes: Conjunctivae and EOM are normal.  Cardiovascular: Regular rhythm.  Tachycardic  Pulmonary/Chest: Effort normal and breath sounds normal. No stridor. No respiratory distress.  Abdominal: There is generalized tenderness and tenderness in the suprapubic area.  Musculoskeletal: She exhibits no edema.  Neurological: She is alert and oriented to person, place, and time. No cranial nerve deficit.  Skin: Skin is warm and dry.  Psychiatric: She has a normal mood and affect.  Nursing note and vitals reviewed.    ED Treatments / Results  Labs (all labs ordered are listed, but only abnormal results are displayed) Labs Reviewed  COMPREHENSIVE METABOLIC PANEL - Abnormal; Notable for the following components:      Result Value   Glucose, Bld 148 (*)    GFR calc non Af Amer 56 (*)    All other components within normal limits  URINALYSIS, ROUTINE W REFLEX MICROSCOPIC - Abnormal; Notable for the following components:   Color, Urine STRAW (*)    Hgb urine dipstick LARGE (*)    Squamous Epithelial / LPF 0-5 (*)    All other components within normal limits  LIPASE, BLOOD  CBC    EKG None  Radiology Ct Renal Stone Study  Result Date:  12/01/2017 CLINICAL DATA:  Right flank pain and suprapubic pain with nausea and vomiting. EXAM: CT ABDOMEN AND PELVIS WITHOUT CONTRAST TECHNIQUE: Multidetector CT imaging of the abdomen and pelvis was performed following the standard protocol without IV contrast. COMPARISON:  01/20/2015 FINDINGS: Lower chest: Mild atelectasis and or patchy infiltrate at the lung bases left more than right. No pleural fluid. Hepatobiliary: Liver parenchyma does not show any significant finding. Few calcified granulomas. Previous cholecystectomy. Pancreas: Chronic fatty atrophy of the pancreas. No mass or inflammatory change. Spleen: Normal Adrenals/Urinary Tract: Adrenal glands are normal. Left kidney is normal with the exception of what  may be a small stone in the renal pelvis without obstruction. The right kidney shows simple cyst at the upper pole measuring 4.7 cm in diameter. There is hydroureteronephrosis and renal swelling with the ureter being dilated to the mid pelvis where there is a 1-2 mm stone. No stone in the bladder. Stomach/Bowel: No bowel pathology seen in the upper abdomen. Patient has diverticulosis in the sigmoid colon. There is some stranding the fat which could go along with low level diverticulitis. This was not seen previously. It could also represent scarring from a different episode. Vascular/Lymphatic: Aortic atherosclerosis. No aneurysm. IVC is normal. No retroperitoneal adenopathy. Reproductive: Previous hysterectomy.  No pelvic mass. Other: No free fluid or air. Small supraumbilical hernia containing only fat. Musculoskeletal: Chronic lumbar degenerative changes. Chronic pars defects at L5 with 4 mm of anterolisthesis. IMPRESSION: 1-2 mm stone in the right ureter at the mid pelvis level with right hydroureteronephrosis. Diverticulosis of the sigmoid colon. Stranding in the adjacent fat raising the possibility of low level diverticulitis. Alternatively, this could be chronic sequela of a previous episode.  Aortic atherosclerosis. Patchy density at the lung bases left more than the right that could be atelectasis or pneumonia. Lumbar degenerative changes.  Chronic pars defects L5. Electronically Signed   By: Nelson Chimes M.D.   On: 12/01/2017 13:11    Procedures Procedures (including critical care time)  Medications Ordered in ED Medications  ketorolac (TORADOL) 30 MG/ML injection 9.9 mg (has no administration in time range)  ondansetron (ZOFRAN-ODT) disintegrating tablet 4 mg (4 mg Oral Given 12/01/17 1135)  fentaNYL (SUBLIMAZE) injection 12.5 mcg (12.5 mcg Intravenous Given 12/01/17 1208)  sodium chloride 0.9 % bolus 500 mL ( Intravenous New Bag/Given 12/01/17 1211)  ondansetron (ZOFRAN) injection 4 mg (4 mg Intravenous Given 12/01/17 1211)  fentaNYL (SUBLIMAZE) injection 25 mcg (25 mcg Intravenous Given 12/01/17 1233)     Initial Impression / Assessment and Plan / ED Course  I have reviewed the triage vital signs and the nursing notes.  Pertinent labs & imaging results that were available during my care of the patient were reviewed by me and considered in my medical decision making (see chart for details).     1:36 PM Patient appears more comfortable, but she continues to complain of pain. I reviewed the CT images, and then discussed them with the patient and her companion. There is evidence for a 2 mm stone in the right mid ureter, consistent with patient's presentation of right flank, suprapubic pain. No evidence for obstruction or infection. Given the persistence of pain in spite of 2  doses of fentanyl, patient will receive Toradol, given allergies to other narcotics.  This elderly female presents with acute onset right flank and right lower abdominal pain, consistent with kidney stone. On CT, this is not needed demonstrated. No evidence for infection, obstruction.  The patient required fluids, multiple doses of narcotics and Toradol for pain control. Once pain was controlled, patient was  discharged in stable condition.  Final Clinical Impressions(s) / ED Diagnoses  Kidney stone   Carmin Muskrat, MD 12/01/17 1520

## 2017-12-03 ENCOUNTER — Inpatient Hospital Stay (HOSPITAL_COMMUNITY)
Admission: EM | Admit: 2017-12-03 | Discharge: 2017-12-07 | DRG: 660 | Disposition: A | Discharge status: Home / Self Care | Payer: Medicare Other | Attending: Internal Medicine | Admitting: Internal Medicine

## 2017-12-03 ENCOUNTER — Encounter (HOSPITAL_COMMUNITY): Payer: Self-pay

## 2017-12-03 DIAGNOSIS — E785 Hyperlipidemia, unspecified: Secondary | ICD-10-CM | POA: Diagnosis present

## 2017-12-03 DIAGNOSIS — Z87442 Personal history of urinary calculi: Secondary | ICD-10-CM

## 2017-12-03 DIAGNOSIS — R319 Hematuria, unspecified: Secondary | ICD-10-CM

## 2017-12-03 DIAGNOSIS — Z8249 Family history of ischemic heart disease and other diseases of the circulatory system: Secondary | ICD-10-CM

## 2017-12-03 DIAGNOSIS — Z66 Do not resuscitate: Secondary | ICD-10-CM | POA: Diagnosis present

## 2017-12-03 DIAGNOSIS — E662 Morbid (severe) obesity with alveolar hypoventilation: Secondary | ICD-10-CM | POA: Diagnosis present

## 2017-12-03 DIAGNOSIS — I5032 Chronic diastolic (congestive) heart failure: Secondary | ICD-10-CM | POA: Diagnosis present

## 2017-12-03 DIAGNOSIS — N201 Calculus of ureter: Secondary | ICD-10-CM | POA: Diagnosis not present

## 2017-12-03 DIAGNOSIS — I10 Essential (primary) hypertension: Secondary | ICD-10-CM | POA: Diagnosis present

## 2017-12-03 DIAGNOSIS — Z7901 Long term (current) use of anticoagulants: Secondary | ICD-10-CM

## 2017-12-03 DIAGNOSIS — I110 Hypertensive heart disease with heart failure: Secondary | ICD-10-CM | POA: Diagnosis present

## 2017-12-03 DIAGNOSIS — E119 Type 2 diabetes mellitus without complications: Secondary | ICD-10-CM | POA: Diagnosis present

## 2017-12-03 DIAGNOSIS — Z9049 Acquired absence of other specified parts of digestive tract: Secondary | ICD-10-CM

## 2017-12-03 DIAGNOSIS — N390 Urinary tract infection, site not specified: Secondary | ICD-10-CM

## 2017-12-03 DIAGNOSIS — E039 Hypothyroidism, unspecified: Secondary | ICD-10-CM | POA: Diagnosis present

## 2017-12-03 DIAGNOSIS — N179 Acute kidney failure, unspecified: Secondary | ICD-10-CM | POA: Diagnosis present

## 2017-12-03 DIAGNOSIS — D649 Anemia, unspecified: Secondary | ICD-10-CM | POA: Diagnosis present

## 2017-12-03 DIAGNOSIS — Z79890 Hormone replacement therapy (postmenopausal): Secondary | ICD-10-CM

## 2017-12-03 DIAGNOSIS — I5033 Acute on chronic diastolic (congestive) heart failure: Secondary | ICD-10-CM

## 2017-12-03 DIAGNOSIS — I480 Paroxysmal atrial fibrillation: Secondary | ICD-10-CM | POA: Diagnosis present

## 2017-12-03 DIAGNOSIS — N136 Pyonephrosis: Principal | ICD-10-CM | POA: Diagnosis present

## 2017-12-03 DIAGNOSIS — Z90710 Acquired absence of both cervix and uterus: Secondary | ICD-10-CM

## 2017-12-03 DIAGNOSIS — J9612 Chronic respiratory failure with hypercapnia: Secondary | ICD-10-CM | POA: Diagnosis present

## 2017-12-03 DIAGNOSIS — R0902 Hypoxemia: Secondary | ICD-10-CM

## 2017-12-03 DIAGNOSIS — Z96659 Presence of unspecified artificial knee joint: Secondary | ICD-10-CM | POA: Diagnosis present

## 2017-12-03 DIAGNOSIS — I4891 Unspecified atrial fibrillation: Secondary | ICD-10-CM | POA: Diagnosis present

## 2017-12-03 DIAGNOSIS — Z6841 Body Mass Index (BMI) 40.0 and over, adult: Secondary | ICD-10-CM

## 2017-12-03 DIAGNOSIS — Z79899 Other long term (current) drug therapy: Secondary | ICD-10-CM

## 2017-12-03 DIAGNOSIS — Z885 Allergy status to narcotic agent status: Secondary | ICD-10-CM

## 2017-12-03 NOTE — ED Triage Notes (Signed)
Per EMS, pt, from home, c/o n/v x 3 days and near syncope this evening.  Pt was diagnosed w/ a kidney stone recently.  Denies pain.    EMS reports pinpoint pupils.

## 2017-12-03 NOTE — ED Notes (Signed)
Bed: OR56 Expected date:  Expected time:  Means of arrival:  Comments: EMS 84 yo female near syncopal episode atrial fib with RVR-kidney stone with N/V

## 2017-12-04 ENCOUNTER — Other Ambulatory Visit: Payer: Self-pay

## 2017-12-04 ENCOUNTER — Encounter (HOSPITAL_COMMUNITY)
Admission: EM | Disposition: A | Discharge status: Home / Self Care | Payer: Self-pay | Source: Home / Self Care | Attending: Internal Medicine

## 2017-12-04 ENCOUNTER — Emergency Department (HOSPITAL_COMMUNITY): Payer: Medicare Other

## 2017-12-04 ENCOUNTER — Encounter (HOSPITAL_COMMUNITY): Payer: Self-pay

## 2017-12-04 ENCOUNTER — Inpatient Hospital Stay (HOSPITAL_COMMUNITY): Payer: Medicare Other

## 2017-12-04 ENCOUNTER — Inpatient Hospital Stay (HOSPITAL_COMMUNITY): Payer: Medicare Other | Admitting: Anesthesiology

## 2017-12-04 DIAGNOSIS — N132 Hydronephrosis with renal and ureteral calculous obstruction: Secondary | ICD-10-CM | POA: Diagnosis not present

## 2017-12-04 DIAGNOSIS — Z66 Do not resuscitate: Secondary | ICD-10-CM | POA: Diagnosis not present

## 2017-12-04 DIAGNOSIS — E662 Morbid (severe) obesity with alveolar hypoventilation: Secondary | ICD-10-CM | POA: Diagnosis not present

## 2017-12-04 DIAGNOSIS — I10 Essential (primary) hypertension: Secondary | ICD-10-CM | POA: Diagnosis not present

## 2017-12-04 DIAGNOSIS — Z9049 Acquired absence of other specified parts of digestive tract: Secondary | ICD-10-CM | POA: Diagnosis not present

## 2017-12-04 DIAGNOSIS — I110 Hypertensive heart disease with heart failure: Secondary | ICD-10-CM | POA: Diagnosis not present

## 2017-12-04 DIAGNOSIS — R112 Nausea with vomiting, unspecified: Secondary | ICD-10-CM | POA: Diagnosis present

## 2017-12-04 DIAGNOSIS — Z87442 Personal history of urinary calculi: Secondary | ICD-10-CM | POA: Diagnosis not present

## 2017-12-04 DIAGNOSIS — Z90710 Acquired absence of both cervix and uterus: Secondary | ICD-10-CM | POA: Diagnosis not present

## 2017-12-04 DIAGNOSIS — I509 Heart failure, unspecified: Secondary | ICD-10-CM | POA: Diagnosis not present

## 2017-12-04 DIAGNOSIS — N390 Urinary tract infection, site not specified: Secondary | ICD-10-CM | POA: Diagnosis not present

## 2017-12-04 DIAGNOSIS — D649 Anemia, unspecified: Secondary | ICD-10-CM

## 2017-12-04 DIAGNOSIS — J9612 Chronic respiratory failure with hypercapnia: Secondary | ICD-10-CM | POA: Diagnosis not present

## 2017-12-04 DIAGNOSIS — Z96659 Presence of unspecified artificial knee joint: Secondary | ICD-10-CM | POA: Diagnosis present

## 2017-12-04 DIAGNOSIS — Z7901 Long term (current) use of anticoagulants: Secondary | ICD-10-CM | POA: Diagnosis not present

## 2017-12-04 DIAGNOSIS — I5032 Chronic diastolic (congestive) heart failure: Secondary | ICD-10-CM | POA: Diagnosis not present

## 2017-12-04 DIAGNOSIS — Z8249 Family history of ischemic heart disease and other diseases of the circulatory system: Secondary | ICD-10-CM | POA: Diagnosis not present

## 2017-12-04 DIAGNOSIS — E038 Other specified hypothyroidism: Secondary | ICD-10-CM

## 2017-12-04 DIAGNOSIS — N200 Calculus of kidney: Secondary | ICD-10-CM | POA: Diagnosis not present

## 2017-12-04 DIAGNOSIS — E119 Type 2 diabetes mellitus without complications: Secondary | ICD-10-CM

## 2017-12-04 DIAGNOSIS — R109 Unspecified abdominal pain: Secondary | ICD-10-CM | POA: Diagnosis not present

## 2017-12-04 DIAGNOSIS — Z79890 Hormone replacement therapy (postmenopausal): Secondary | ICD-10-CM | POA: Diagnosis not present

## 2017-12-04 DIAGNOSIS — R319 Hematuria, unspecified: Secondary | ICD-10-CM

## 2017-12-04 DIAGNOSIS — N179 Acute kidney failure, unspecified: Secondary | ICD-10-CM

## 2017-12-04 DIAGNOSIS — Z885 Allergy status to narcotic agent status: Secondary | ICD-10-CM | POA: Diagnosis not present

## 2017-12-04 DIAGNOSIS — Z6841 Body Mass Index (BMI) 40.0 and over, adult: Secondary | ICD-10-CM | POA: Diagnosis not present

## 2017-12-04 DIAGNOSIS — J90 Pleural effusion, not elsewhere classified: Secondary | ICD-10-CM | POA: Diagnosis not present

## 2017-12-04 DIAGNOSIS — N136 Pyonephrosis: Secondary | ICD-10-CM | POA: Diagnosis present

## 2017-12-04 DIAGNOSIS — Z79899 Other long term (current) drug therapy: Secondary | ICD-10-CM | POA: Diagnosis not present

## 2017-12-04 DIAGNOSIS — E785 Hyperlipidemia, unspecified: Secondary | ICD-10-CM | POA: Diagnosis not present

## 2017-12-04 DIAGNOSIS — I480 Paroxysmal atrial fibrillation: Secondary | ICD-10-CM

## 2017-12-04 DIAGNOSIS — E039 Hypothyroidism, unspecified: Secondary | ICD-10-CM | POA: Diagnosis not present

## 2017-12-04 HISTORY — PX: CYSTOSCOPY W/ URETERAL STENT PLACEMENT: SHX1429

## 2017-12-04 LAB — CBC WITH DIFFERENTIAL/PLATELET
BASOS ABS: 0 10*3/uL (ref 0.0–0.1)
BASOS PCT: 0 %
EOS ABS: 0 10*3/uL (ref 0.0–0.7)
Eosinophils Relative: 0 %
HEMATOCRIT: 35.3 % — AB (ref 36.0–46.0)
HEMOGLOBIN: 11.2 g/dL — AB (ref 12.0–15.0)
Lymphocytes Relative: 8 %
Lymphs Abs: 0.9 10*3/uL (ref 0.7–4.0)
MCH: 30.9 pg (ref 26.0–34.0)
MCHC: 31.7 g/dL (ref 30.0–36.0)
MCV: 97.5 fL (ref 78.0–100.0)
Monocytes Absolute: 0.8 10*3/uL (ref 0.1–1.0)
Monocytes Relative: 7 %
NEUTROS ABS: 9.7 10*3/uL — AB (ref 1.7–7.7)
NEUTROS PCT: 85 %
Platelets: 209 10*3/uL (ref 150–400)
RBC: 3.62 MIL/uL — ABNORMAL LOW (ref 3.87–5.11)
RDW: 14.1 % (ref 11.5–15.5)
WBC: 11.4 10*3/uL — ABNORMAL HIGH (ref 4.0–10.5)

## 2017-12-04 LAB — GLUCOSE, CAPILLARY
GLUCOSE-CAPILLARY: 105 mg/dL — AB (ref 65–99)
GLUCOSE-CAPILLARY: 130 mg/dL — AB (ref 65–99)
Glucose-Capillary: 117 mg/dL — ABNORMAL HIGH (ref 65–99)

## 2017-12-04 LAB — COMPREHENSIVE METABOLIC PANEL
ALK PHOS: 63 U/L (ref 38–126)
ALT: 14 U/L (ref 14–54)
ANION GAP: 8 (ref 5–15)
AST: 17 U/L (ref 15–41)
Albumin: 3.1 g/dL — ABNORMAL LOW (ref 3.5–5.0)
BILIRUBIN TOTAL: 0.8 mg/dL (ref 0.3–1.2)
BUN: 21 mg/dL — ABNORMAL HIGH (ref 6–20)
CALCIUM: 8.6 mg/dL — AB (ref 8.9–10.3)
CO2: 30 mmol/L (ref 22–32)
Chloride: 96 mmol/L — ABNORMAL LOW (ref 101–111)
Creatinine, Ser: 1.46 mg/dL — ABNORMAL HIGH (ref 0.44–1.00)
GFR, EST AFRICAN AMERICAN: 37 mL/min — AB (ref >60)
GFR, EST NON AFRICAN AMERICAN: 32 mL/min — AB (ref >60)
Glucose, Bld: 152 mg/dL — ABNORMAL HIGH (ref 65–99)
Potassium: 4 mmol/L (ref 3.5–5.1)
Sodium: 134 mmol/L — ABNORMAL LOW (ref 135–145)
TOTAL PROTEIN: 6.2 g/dL — AB (ref 6.5–8.1)

## 2017-12-04 LAB — URINALYSIS, ROUTINE W REFLEX MICROSCOPIC
BILIRUBIN URINE: NEGATIVE
Glucose, UA: NEGATIVE mg/dL
KETONES UR: NEGATIVE mg/dL
NITRITE: NEGATIVE
Protein, ur: 30 mg/dL — AB
SPECIFIC GRAVITY, URINE: 1.008 (ref 1.005–1.030)
pH: 6 (ref 5.0–8.0)

## 2017-12-04 LAB — LIPASE, BLOOD: LIPASE: 18 U/L (ref 11–51)

## 2017-12-04 LAB — MRSA PCR SCREENING: MRSA BY PCR: NEGATIVE

## 2017-12-04 SURGERY — CYSTOSCOPY, WITH RETROGRADE PYELOGRAM AND URETERAL STENT INSERTION
Anesthesia: General | Laterality: Right

## 2017-12-04 MED ORDER — ALBUTEROL SULFATE (2.5 MG/3ML) 0.083% IN NEBU: 2.5000 mg | INHALATION_SOLUTION | Freq: Four times a day (QID) | RESPIRATORY_TRACT | Status: DC | PRN

## 2017-12-04 MED ORDER — ACETAMINOPHEN 650 MG RE SUPP: 650.0000 mg | Freq: Four times a day (QID) | RECTAL | Status: DC | PRN

## 2017-12-04 MED ORDER — MORPHINE SULFATE (PF) 4 MG/ML IV SOLN
4.0000 mg | Freq: Once | INTRAVENOUS | Status: AC
  Administered 2017-12-04: 4 mg via INTRAVENOUS
  Filled 2017-12-04: qty 1

## 2017-12-04 MED ORDER — PROPOFOL 10 MG/ML IV BOLUS
INTRAVENOUS | Status: DC | PRN
  Administered 2017-12-04: 100 mg via INTRAVENOUS

## 2017-12-04 MED ORDER — MORPHINE SULFATE (PF) 4 MG/ML IV SOLN
2.0000 mg | INTRAVENOUS | Status: DC | PRN
  Administered 2017-12-04 (×2): 4 mg via INTRAVENOUS
  Filled 2017-12-04 (×2): qty 1

## 2017-12-04 MED ORDER — INSULIN ASPART 100 UNIT/ML ~~LOC~~ SOLN
0.0000 [IU] | Freq: Three times a day (TID) | SUBCUTANEOUS | Status: DC
  Administered 2017-12-05 – 2017-12-06 (×4): 1 [IU] via SUBCUTANEOUS

## 2017-12-04 MED ORDER — IOHEXOL 300 MG/ML  SOLN
INTRAMUSCULAR | Status: DC | PRN
  Administered 2017-12-04: 10 mL via URETHRAL

## 2017-12-04 MED ORDER — ONDANSETRON HCL 4 MG/2ML IJ SOLN
4.0000 mg | Freq: Four times a day (QID) | INTRAMUSCULAR | Status: DC | PRN
  Administered 2017-12-04: 4 mg via INTRAVENOUS
  Filled 2017-12-04: qty 2

## 2017-12-04 MED ORDER — SODIUM CHLORIDE 0.9 % IV SOLN
1.0000 g | Freq: Once | INTRAVENOUS | Status: AC
  Administered 2017-12-04: 1 g via INTRAVENOUS
  Filled 2017-12-04: qty 10

## 2017-12-04 MED ORDER — SODIUM CHLORIDE 0.9 % IV SOLN
1.0000 g | INTRAVENOUS | Status: DC
  Administered 2017-12-04 – 2017-12-06 (×2): 1 g via INTRAVENOUS
  Filled 2017-12-04 (×3): qty 10

## 2017-12-04 MED ORDER — LIDOCAINE 2% (20 MG/ML) 5 ML SYRINGE
INTRAMUSCULAR | Status: DC | PRN
  Administered 2017-12-04: 60 mg via INTRAVENOUS

## 2017-12-04 MED ORDER — SODIUM CHLORIDE 0.9 % IV SOLN: 1.0000 g | INTRAVENOUS | Status: DC

## 2017-12-04 MED ORDER — ONDANSETRON HCL 4 MG PO TABS: 4.0000 mg | ORAL_TABLET | Freq: Four times a day (QID) | ORAL | Status: DC | PRN

## 2017-12-04 MED ORDER — CIPROFLOXACIN IN D5W 400 MG/200ML IV SOLN
INTRAVENOUS | Status: DC | PRN
  Administered 2017-12-04: 400 mg via INTRAVENOUS

## 2017-12-04 MED ORDER — LEVOTHYROXINE SODIUM 100 MCG IV SOLR
50.0000 ug | Freq: Every day | INTRAVENOUS | Status: DC
  Administered 2017-12-04: 50 ug via INTRAVENOUS
  Filled 2017-12-04: qty 5

## 2017-12-04 MED ORDER — FENTANYL CITRATE (PF) 100 MCG/2ML IJ SOLN
INTRAMUSCULAR | Status: AC
  Filled 2017-12-04: qty 2

## 2017-12-04 MED ORDER — DEXAMETHASONE SODIUM PHOSPHATE 10 MG/ML IJ SOLN
INTRAMUSCULAR | Status: DC | PRN
  Administered 2017-12-04: 5 mg via INTRAVENOUS

## 2017-12-04 MED ORDER — FENTANYL CITRATE (PF) 100 MCG/2ML IJ SOLN
INTRAMUSCULAR | Status: DC | PRN
  Administered 2017-12-04: 100 ug via INTRAVENOUS

## 2017-12-04 MED ORDER — CEFTRIAXONE SODIUM 2 G IJ SOLR: 2.0000 g | INTRAMUSCULAR | Status: DC

## 2017-12-04 MED ORDER — PROPOFOL 10 MG/ML IV BOLUS
INTRAVENOUS | Status: AC
  Filled 2017-12-04: qty 20

## 2017-12-04 MED ORDER — ONDANSETRON HCL 4 MG/2ML IJ SOLN
INTRAMUSCULAR | Status: DC | PRN
  Administered 2017-12-04: 4 mg via INTRAVENOUS

## 2017-12-04 MED ORDER — SODIUM CHLORIDE 0.9 % IV SOLN
INTRAVENOUS | Status: DC
  Administered 2017-12-04: 07:00:00 via INTRAVENOUS

## 2017-12-04 MED ORDER — INSULIN ASPART 100 UNIT/ML ~~LOC~~ SOLN: 0.0000 [IU] | SUBCUTANEOUS | Status: DC

## 2017-12-04 MED ORDER — SODIUM CHLORIDE 0.9 % IR SOLN
Status: DC | PRN
  Administered 2017-12-04: 3000 mL

## 2017-12-04 MED ORDER — LACTATED RINGERS IV SOLN
INTRAVENOUS | Status: DC
  Administered 2017-12-04: 16:00:00 via INTRAVENOUS

## 2017-12-04 MED ORDER — ACETAMINOPHEN 325 MG PO TABS
650.0000 mg | ORAL_TABLET | Freq: Four times a day (QID) | ORAL | Status: DC | PRN
  Administered 2017-12-07: 650 mg via ORAL
  Filled 2017-12-04: qty 2

## 2017-12-04 MED ORDER — CIPROFLOXACIN IN D5W 400 MG/200ML IV SOLN
INTRAVENOUS | Status: AC
  Filled 2017-12-04: qty 200

## 2017-12-04 SURGICAL SUPPLY — 19 items
BAG URO CATCHER STRL LF (MISCELLANEOUS) ×3 IMPLANT
BASKET ZERO TIP NITINOL 2.4FR (BASKET) IMPLANT
BSKT STON RTRVL ZERO TP 2.4FR (BASKET)
CATH INTERMIT  6FR 70CM (CATHETERS) ×2 IMPLANT
CATH URET 5FR 28IN CONE TIP (BALLOONS) ×2
CATH URET 5FR 70CM CONE TIP (BALLOONS) IMPLANT
CLOTH BEACON ORANGE TIMEOUT ST (SAFETY) ×3 IMPLANT
COVER FOOTSWITCH UNIV (MISCELLANEOUS) ×2 IMPLANT
COVER SURGICAL LIGHT HANDLE (MISCELLANEOUS) ×1 IMPLANT
GLOVE BIOGEL M STRL SZ7.5 (GLOVE) ×7 IMPLANT
GOWN STRL REUS W/TWL LRG LVL3 (GOWN DISPOSABLE) ×3 IMPLANT
GOWN STRL REUS W/TWL XL LVL3 (GOWN DISPOSABLE) ×3 IMPLANT
GUIDEWIRE ANG ZIPWIRE 038X150 (WIRE) IMPLANT
GUIDEWIRE STR DUAL SENSOR (WIRE) ×3 IMPLANT
MANIFOLD NEPTUNE II (INSTRUMENTS) ×3 IMPLANT
PACK CYSTO (CUSTOM PROCEDURE TRAY) ×3 IMPLANT
STENT CONTOUR 6FRX24X.038 (STENTS) ×2 IMPLANT
TUBING CONNECTING 10 (TUBING) ×2 IMPLANT
TUBING CONNECTING 10' (TUBING) ×1

## 2017-12-04 NOTE — ED Provider Notes (Signed)
[facility] DEPT Provider Note   CSN: [id number] Arrival date & time: 12/03/17  2324     History   Chief Complaint Chief Complaint  Patient presents with  . Near Syncope  . Emesis    HPI [name] Case is a 84 y.o. female.  The history is provided by the patient.  She has history of hypertension, hyperlipidemia, atrial fibrillation anticoagulated on apixaban, diabetes, heart failure and was in the ED 2 days ago with a right-sided kidney stone.  She continues to have pain in the right flank radiating to the right lower abdomen.  Pain is been as severe as 5/10.  She has developed nausea and vomiting and had a near syncopal episode at home.  She denies fever or chills.  She did see her urologist today and was given prescription for ondansetron and tamsulosin.  She had been given a prescription for tramadol when she was in the ED 2 days ago.  Tramadol had been giving her adequate pain relief until today.  Past Medical History:  Diagnosis Date  . Allergy   . Asthma   . Atrial fibrillation ([place])   . Bronchitis   . CHF (congestive heart failure) ([place])   . Diabetes mellitus without complication ([place])   . GERD (gastroesophageal reflux disease)   . Hyperlipidemia   . Hypertension   . Thyroid disease     Patient Active Problem List   Diagnosis Date Noted  . Lichen planus 97/10/6376  . Acute respiratory failure with hypoxia ([place])   . Obesity hypoventilation syndrome ([place])   . Chronic CHF (congestive heart failure) ([place]) 01/09/2016  . Atrial fibrillation with RVR ([place]) 01/09/2016  . History of snoring 05/20/2015  . Chronic respiratory failure with hypercapnia ([place])   . Type 2 diabetes mellitus without complication ([place])   . Atrial fibrillation ([place]) 01/06/2015  . Hypothyroidism 03/09/2007  . Hyperlipidemia 03/09/2007  . Morbid obesity ([place]) 03/09/2007  . Essential hypertension 03/09/2007  . ALLERGIC RHINITIS 03/09/2007  . Asthma 03/09/2007  .  GERD 03/09/2007  . INCONTINENCE 03/09/2007    Past Surgical History:  Procedure Laterality Date  . ABDOMINAL HYSTERECTOMY    . APPENDECTOMY    . CHOLECYSTECTOMY    . TOTAL KNEE ARTHROPLASTY       OB History   None      Home Medications    Prior to Admission medications   Medication Sig Start Date End Date Taking? Authorizing Provider  albuterol (PROVENTIL) (2.5 MG/3ML) 0.083% nebulizer solution Take 3 mLs (2.5 mg total) by nebulization every 6 (six) hours as needed for wheezing or shortness of breath. 10/28/17  Yes Danford, Suann Larry, MD  apixaban (ELIQUIS) 5 MG TABS tablet Take 1 tablet (5 mg total) by mouth 2 (two) times daily. 11/04/17  Yes Hoyt Koch, MD  atorvastatin (LIPITOR) 20 MG tablet TAKE 1 TABLET BY MOUTH DAILY AT 6PM 07/01/17  Yes Hoyt Koch, MD  diltiazem (CARDIZEM CD) 180 MG 24 hr capsule Take 1 capsule (180 mg total) by mouth daily. 10/15/17  Yes Hoyt Koch, MD  furosemide (LASIX) 80 MG tablet TAKE 1/2 TABLET BY MOUTH DAILY 10/05/17  Yes Hoyt Koch, MD  irbesartan (AVAPRO) 150 MG tablet Take 0.5 tablets (75 mg total) by mouth daily. 06/03/17  Yes Hoyt Koch, MD  levothyroxine (SYNTHROID, LEVOTHROID) 100 MCG tablet TAKE 1 TABLET BY MOUTH ONCE DAILY 12/03/16  Yes Hoyt Koch, MD  metoprolol succinate (TOPROL-XL) 50 MG 24 hr  tablet TAKE 1 TABLET BY MOUTH DAILY, TAKE WITH OR IMMEDIATELY FOLLOWING A MEAL 08/03/17  Yes Jerline Pain, MD  ondansetron (ZOFRAN) 4 MG tablet Take 1 tablet by mouth every 6 (six) hours as needed for nausea.  12/03/17  Yes [provider]  potassium chloride (K-DUR) 10 MEQ tablet Take 1 tablet (10 mEq total) by mouth daily. 08/20/17  Yes Jerline Pain, MD  tamsulosin (FLOMAX) 0.4 MG CAPS capsule Take 1 capsule by mouth daily. 12/03/17  Yes [provider]  traMADol (ULTRAM) 50 MG tablet Take 1 tablet (50 mg total) by mouth every 6 (six) hours as needed. 12/01/17  Yes Carmin Muskrat, MD    Family History Family History  Problem Relation Age of Onset  . Heart disease Mother   . Colon cancer Mother     Social History Social History   Tobacco Use  . Smoking status: Never Smoker  . Smokeless tobacco: Never Used  Substance Use Topics  . Alcohol use: No    Alcohol/week: 0.0 oz  . Drug use: No     Allergies   Codeine   Review of Systems Review of Systems  All other systems reviewed and are negative.    Physical Exam Updated Vital Signs BP 114/68 (BP Location: Left Arm)   Pulse 93   Temp 98.6 F (37 C) (Oral)   Resp 18   Ht 5\' 3"  (1.6 m)   LMP  (LMP Unknown)   SpO2 97%   BMI 38.97 kg/m   Physical Exam  Nursing note and vitals reviewed.  84 year old female, resting comfortably and in no acute distress. Vital signs are normal. Oxygen saturation is 97%, which is normal. Head is normocephalic and atraumatic. PERRLA, EOMI. Oropharynx is clear. Neck is nontender and supple without adenopathy or JVD. Back is nontender and there is no CVA tenderness. Lungs are clear without rales, wheezes, or rhonchi. Chest is nontender. Heart has regular rate and rhythm without murmur. Abdomen is soft, flat, nontender without masses or hepatosplenomegaly and peristalsis is hypoactive. Extremities have no cyanosis or edema, full range of motion is present. Skin is warm and dry without rash. Neurologic: Mental status is normal, cranial nerves are intact, there are no motor or sensory deficits.  ED Treatments / Results  Labs (all labs ordered are listed, but only abnormal results are displayed) Labs Reviewed  COMPREHENSIVE METABOLIC PANEL - Abnormal; Notable for the following components:      Result Value   Sodium 134 (*)    Chloride 96 (*)    Glucose, Bld 152 (*)    BUN 21 (*)    Creatinine, Ser 1.46 (*)    Calcium 8.6 (*)    Total Protein 6.2 (*)    Albumin 3.1 (*)    GFR calc non Af Amer 32 (*)    GFR calc Af Amer 37 (*)    All other  components within normal limits  CBC WITH DIFFERENTIAL/PLATELET - Abnormal; Notable for the following components:   WBC 11.4 (*)    RBC 3.62 (*)    Hemoglobin 11.2 (*)    HCT 35.3 (*)    Neutro Abs 9.7 (*)    All other components within normal limits  URINALYSIS, ROUTINE W REFLEX MICROSCOPIC - Abnormal; Notable for the following components:   Hgb urine dipstick LARGE (*)    Protein, ur 30 (*)    Leukocytes, UA MODERATE (*)    Bacteria, UA RARE (*)    Squamous  Epithelial / LPF 0-5 (*)    All other components within normal limits  GLUCOSE, CAPILLARY - Abnormal; Notable for the following components:   Glucose-Capillary 105 (*)    All other components within normal limits  GLUCOSE, CAPILLARY - Abnormal; Notable for the following components:   Glucose-Capillary 130 (*)    All other components within normal limits  GLUCOSE, CAPILLARY - Abnormal; Notable for the following components:   Glucose-Capillary 117 (*)    All other components within normal limits  MRSA PCR SCREENING  URINE CULTURE  LIPASE, BLOOD  CBC WITH DIFFERENTIAL/PLATELET  BASIC METABOLIC PANEL    Radiology Dg C-arm 1-60 Min-no Report  Result Date: 12/04/2017 Fluoroscopy was utilized by the requesting physician.  No radiographic interpretation.   Ct Renal Stone Study  Result Date: 12/04/2017 CLINICAL DATA:  Right flank pain EXAM: CT ABDOMEN AND PELVIS WITHOUT CONTRAST TECHNIQUE: Multidetector CT imaging of the abdomen and pelvis was performed following the standard protocol without IV contrast. COMPARISON:  12/01/2017 CT abdomen pelvis FINDINGS: LOWER CHEST: Left basilar atelectasis. HEPATOBILIARY: Normal hepatic contours and density. No intra- or extrahepatic biliary dilatation. Status post cholecystectomy. PANCREAS: Diffuse fatty replacement of the pancreas. SPLEEN: Normal. ADRENALS/URINARY TRACT: --Adrenal glands: Normal. --Right kidney/ureter: Unchanged 4.6 cm renal cyst. There is hyperdense material in the posterior  aspect of the renal pelvis. There is periureteral inflammation and mild hydroureteronephrosis. There is a 2 mm stone in the distal right ureter (series 2 image 69). --Left kidney/ureter: No hydronephrosis, nephroureterolithiasis, perinephric stranding or solid renal mass. --Urinary bladder: Normal for degree of distention STOMACH/BOWEL: --Stomach/Duodenum: No hiatal hernia or other gastric abnormality. Normal duodenal course. --Small bowel: No dilatation or inflammation. --Colon: Rectosigmoid diverticulosis without acute inflammation. --Appendix: Not visualized. No right lower quadrant inflammation or free fluid. VASCULAR/LYMPHATIC: Atherosclerotic calcification is present within the non-aneurysmal abdominal aorta, without hemodynamically significant stenosis. No abdominal or pelvic lymphadenopathy. REPRODUCTIVE: Status post hysterectomy. No adnexal mass. MUSCULOSKELETAL. Multilevel degenerative disc disease and facet arthrosis. No bony spinal canal stenosis. 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 OTHER: None. IMPRESSION: 1. Persistent right-sided obstructive uropathy with 2 mm stone in the distal right ureter causing moderate hydroureteronephrosis and perinephric and periureteral inflammatory stranding. 2. Rectosigmoid diverticulosis without acute inflammation. The mild pericolonic inflammatory change seen on the prior study has resolved. 3.  Aortic Atherosclerosis (ICD10-I70.0). Electronically Signed   By: Ulyses Jarred M.D.   On: 12/04/2017 05:53    Procedures Procedures  Medications Ordered in ED Medications  morphine 4 MG/ML injection 2-4 mg (4 mg Intravenous Given 12/04/17 2107)  acetaminophen (TYLENOL) tablet 650 mg ( Oral MAR Unhold 12/04/17 1843)    Or  acetaminophen (TYLENOL) suppository 650 mg ( Rectal MAR Unhold 12/04/17 1843)  ondansetron (ZOFRAN) tablet 4 mg ( Oral MAR Unhold 12/04/17 1843)    Or  ondansetron (ZOFRAN) injection 4 mg ( Intravenous MAR Unhold 12/04/17 1843)  0.9 %  sodium  chloride infusion ( Intravenous New Bag/Given 12/04/17 0647)  albuterol (PROVENTIL) (2.5 MG/3ML) 0.083% nebulizer solution 2.5 mg ( Nebulization MAR Unhold 12/04/17 1843)  levothyroxine (SYNTHROID, LEVOTHROID) injection 50 mcg ( Intravenous MAR Unhold 12/04/17 1843)  cefTRIAXone (ROCEPHIN) 1 g in sodium chloride 0.9 % 100 mL IVPB (1 g Intravenous New Bag/Given 12/04/17 2107)  insulin aspart (novoLOG) injection 0-9 Units (0 Units Subcutaneous Not Given 12/04/17 2106)  morphine 4 MG/ML injection 4 mg (4 mg Intravenous Given 12/04/17 0243)  cefTRIAXone (ROCEPHIN) 1 g in sodium chloride 0.9 % 100 mL IVPB (0 g Intravenous Stopped  12/04/17 0357)  ciprofloxacin (CIPRO) 400 MG/200ML IVPB (  Override pull for Anesthesia 12/04/17 1657)     Initial Impression / Assessment and Plan / ED Course  I have reviewed the triage vital signs and the nursing notes.  Pertinent labs & imaging results that were available during my care of the patient were reviewed by me and considered in my medical decision making (see chart for details).  Ureterolithiasis with renal colic.  Nausea and vomiting secondary to above.  Near syncopal episode appears to have been a vasovagal episode.  No red flags to suggest more serious pathology.  She will be given IV fluids, morphine, ondansetron.  Anticipate she will be able to be discharged on a stronger narcotic pain medication.  Old records are reviewed confirming recent ED visit with CT showing 2 mm calculus in the right mid ureter.  Urinalysis shows evidence of UTI, and she is started on ceftriaxone. Metabolic panel shows acute kidney injury, so she will need to be admitted. Case is discussed with Dr. Alcario Drought of [facility], who agrees to admit the patient. She is sent for CT to determine if this is a closed space infection.  CT shows persistent distal right ureteral calculus. Urology will need to be consulted.  Final Clinical Impressions(s) / ED Diagnoses   Final diagnoses:  Urinary  tract infection with hematuria, site unspecified  Acute kidney injury (nontraumatic) (HCC)  Normochromic normocytic anemia  Chronic anticoagulation    ED Discharge Orders    None       Delora Fuel, MD 39/53/20 2246

## 2017-12-04 NOTE — Discharge Instructions (Signed)
Ureteral Stent Implantation, Care After Refer to this sheet in the next few weeks. These instructions provide you with information about caring for yourself after your procedure. Your health care provider may also give you more specific instructions. Your treatment has been planned according to current medical practices, but problems sometimes occur. Call your health care provider if you have any problems or questions after your procedure.  Removal of the stent: Be sure to follow-up with Dr. Louis Meckel to plan removal of the stent/stone.  What can I expect after the procedure? After the procedure, it is common to have:  Nausea.  Mild pain when you urinate. You may feel this pain in your lower back or lower abdomen. Pain should stop within a few minutes after you urinate. This may last for up to 1 week.  A small amount of blood in your urine for several days.  Follow these instructions at home:  Medicines  Take over-the-counter and prescription medicines only as told by your health care provider.  If you were prescribed an antibiotic medicine, take it as told by your health care provider. Do not stop taking the antibiotic even if you start to feel better.  Do not drive for 24 hours if you received a sedative.  Do not drive or operate heavy machinery while taking prescription pain medicines. Activity  Return to your normal activities as told by your health care provider. Ask your health care provider what activities are safe for you.  Do not lift anything that is heavier than 10 lb (4.5 kg). Follow this limit for 1 week after your procedure, or for as long as told by your health care provider. General instructions  Watch for any blood in your urine. Call your health care provider if the amount of blood in your urine increases.  If you have a catheter: ? Follow instructions from your health care provider about taking care of your catheter and collection bag. ? Do not take baths, swim,  or use a hot tub until your health care provider approves.  Drink enough fluid to keep your urine clear or pale yellow.  Keep all follow-up visits as told by your health care provider. This is important. Contact a health care provider if:  You have pain that gets worse or does not get better with medicine, especially pain when you urinate.  You have difficulty urinating.  You feel nauseous or you vomit repeatedly during a period of more than 2 days after the procedure. Get help right away if:  Your urine is dark red or has blood clots in it.  You are leaking urine (have incontinence).  The end of the stent comes out of your urethra.  You cannot urinate.  You have sudden, sharp, or severe pain in your abdomen or lower back.  You have a fever. This information is not intended to replace advice given to you by your health care provider. Make sure you discuss any questions you have with your health care provider. Document Released: 04/20/2013 Document Revised: 01/24/2016 Document Reviewed: 03/02/2015 Elsevier Interactive Patient Education  [employer].

## 2017-12-04 NOTE — Care Management Note (Signed)
Case Management Note  Patient Details  Name: [name] Case MRN: [id number] Date of Birth: 11-12-1931  Subjective/Objective:  84 y/o f admitted w/R flank pain. From home. Urology following-for ureteral stent. CM referral for med asst-patient has health insurance w/presciption coverage-patient is obligated to co pay. Will monitor if discount coupon can be used to defray the cost.                  Action/Plan:d/c plan home.   Expected Discharge Date:                  Expected Discharge Plan:  Home/Self Care  In-House Referral:     Discharge planning Services  CM Consult  Post Acute Care Choice:    Choice offered to:     DME Arranged:    DME Agency:     HH Arranged:    HH Agency:     Status of Service:  In process, will continue to follow  If discussed at Long Length of Stay Meetings, dates discussed:    Additional Comments:  Dessa Phi, RN 12/04/2017, 1:01 PM

## 2017-12-04 NOTE — ED Notes (Signed)
[place], [phone], Demetrius Charity

## 2017-12-04 NOTE — Consult Note (Signed)
Urology Consult Note   Requesting Attending Physician:  Shelly Coss, MD Service Providing Consult: Urology  Consulting Attending: Dr. Junious Silk   Reason for Consult:  Right flank pain  HPI: [name] Case is seen in consultation for reasons noted above at the request of Shelly Coss, MD for evaluation of right flank pain.  This is a 84 y.o. female with a history of CHF, A. fib on Eliquis, OSA, and HTN who presents with right flank pain.  She was seen in the office yesterday for an obstructing right-sided 2 mm distal ureteral stone.  Given her medical comorbidities, she was trialed on medical expulsive therapy.  Overnight, however, she developed worsening right flank pain, dizziness, nausea, and subjective fevers.  She presented to the ED this morning with a mild new AK I with creatinine 1.4 from 0.92 days ago.  UA concerning for infection.  Received ceftriaxone in the ED.  Repeat CT scan revealed that her 2 mm right-sided mid ureteral stone is still present, now with some perinephric and periureteral inflammatory stranding.   Past Medical History: Past Medical History:  Diagnosis Date  . Allergy   . Asthma   . Atrial fibrillation ([place])   . Bronchitis   . CHF (congestive heart failure) ([place])   . Diabetes mellitus without complication ([place])   . GERD (gastroesophageal reflux disease)   . Hyperlipidemia   . Hypertension   . Thyroid disease     Past Surgical History:  Past Surgical History:  Procedure Laterality Date  . ABDOMINAL HYSTERECTOMY    . APPENDECTOMY    . CHOLECYSTECTOMY    . TOTAL KNEE ARTHROPLASTY      Medication: Current Facility-Administered Medications  Medication Dose Route Frequency Provider Last Rate Last Dose  . 0.9 %  sodium chloride infusion   Intravenous Continuous Etta Quill, DO 125 mL/hr at 12/04/17 8182    . acetaminophen (TYLENOL) tablet 650 mg  650 mg Oral Q6H PRN Etta Quill, DO       Or  . acetaminophen (TYLENOL) suppository 650  mg  650 mg Rectal Q6H PRN Etta Quill, DO      . albuterol (PROVENTIL) (2.5 MG/3ML) 0.083% nebulizer solution 2.5 mg  2.5 mg Nebulization Q6H PRN Etta Quill, DO      . [START ON 12/05/2017] cefTRIAXone (ROCEPHIN) 1 g in sodium chloride 0.9 % 100 mL IVPB  1 g Intravenous Q24H Alcario Drought, Jared M, DO      . insulin aspart (novoLOG) injection 0-9 Units  0-9 Units Subcutaneous Q4H Alcario Drought, Jared M, DO      . levothyroxine (SYNTHROID, LEVOTHROID) injection 50 mcg  50 mcg Intravenous Daily Alcario Drought, Jared M, DO      . morphine 2 MG/ML injection 2-4 mg  2-4 mg Intravenous Q4H PRN Etta Quill, DO      . ondansetron [facility]) tablet 4 mg  4 mg Oral Q6H PRN Etta Quill, DO       Or  . ondansetron [facility]) injection 4 mg  4 mg Intravenous Q6H PRN Etta Quill, DO       Current Outpatient Medications  Medication Sig Dispense Refill  . albuterol (PROVENTIL) (2.5 MG/3ML) 0.083% nebulizer solution Take 3 mLs (2.5 mg total) by nebulization every 6 (six) hours as needed for wheezing or shortness of breath. 75 mL 1  . apixaban (ELIQUIS) 5 MG TABS tablet Take 1 tablet (5 mg total) by mouth 2 (two) times daily. 180 tablet 3  . atorvastatin (  LIPITOR) 20 MG tablet TAKE 1 TABLET BY MOUTH DAILY AT 6PM 90 tablet 1  . diltiazem (CARDIZEM CD) 180 MG 24 hr capsule Take 1 capsule (180 mg total) by mouth daily. 90 capsule 1  . furosemide (LASIX) 80 MG tablet TAKE 1/2 TABLET BY MOUTH DAILY 30 tablet 1  . irbesartan (AVAPRO) 150 MG tablet Take 0.5 tablets (75 mg total) by mouth daily. 30 tablet 5  . levothyroxine (SYNTHROID, LEVOTHROID) 100 MCG tablet TAKE 1 TABLET BY MOUTH ONCE DAILY 90 tablet 3  . metoprolol succinate (TOPROL-XL) 50 MG 24 hr tablet TAKE 1 TABLET BY MOUTH DAILY, TAKE WITH OR IMMEDIATELY FOLLOWING A MEAL 90 tablet 1  . ondansetron (ZOFRAN) 4 MG tablet Take 1 tablet by mouth every 6 (six) hours as needed for nausea.     . potassium chloride (K-DUR) 10 MEQ tablet Take 1 tablet (10 mEq total) by  mouth daily. 90 tablet 3  . tamsulosin (FLOMAX) 0.4 MG CAPS capsule Take 1 capsule by mouth daily.    . traMADol (ULTRAM) 50 MG tablet Take 1 tablet (50 mg total) by mouth every 6 (six) hours as needed. 15 tablet 0    Allergies: Allergies  Allergen Reactions  . Codeine Hives and Rash    In cough syrup preparations    Social History: Social History   Tobacco Use  . Smoking status: Never Smoker  . Smokeless tobacco: Never Used  Substance Use Topics  . Alcohol use: No    Alcohol/week: 0.0 oz  . Drug use: No    Family History Family History  Problem Relation Age of Onset  . Heart disease Mother   . Colon cancer Mother     Review of Systems 10 systems were reviewed and are negative except as noted specifically in the HPI.  Objective   Vital signs in last 24 hours: BP (!) 148/86   Pulse 93   Temp 98.5 F (36.9 C) (Oral)   Resp (!) 25   Ht 5\' 3"  (1.6 m)   LMP  (LMP Unknown)   SpO2 96%   BMI 38.97 kg/m   Physical Exam General: NAD, A&O, resting, appropriate HEENT: [place]/AT, EOMI, MMM Pulmonary: Normal work of breathing Cardiovascular: HDS, adequate peripheral perfusion Abdomen: Soft, NTTP, nondistended. GU: R CVA tenderness Extremities: warm and well perfused Neuro: Appropriate, no focal neurological deficits  Most Recent Labs: Lab Results  Component Value Date   WBC 11.4 (H) 12/04/2017   HGB 11.2 (L) 12/04/2017   HCT 35.3 (L) 12/04/2017   PLT 209 12/04/2017    Lab Results  Component Value Date   NA 134 (L) 12/04/2017   K 4.0 12/04/2017   CL 96 (L) 12/04/2017   CO2 30 12/04/2017   BUN 21 (H) 12/04/2017   CREATININE 1.46 (H) 12/04/2017   CALCIUM 8.6 (L) 12/04/2017   MG 1.9 01/10/2016    Lab Results  Component Value Date   INR 1.74 (H) 01/12/2015   APTT 31 01/07/2015     IMAGING: Ct Renal Stone Study  Result Date: 12/04/2017 CLINICAL DATA:  Right flank pain EXAM: CT ABDOMEN AND PELVIS WITHOUT CONTRAST TECHNIQUE: Multidetector CT imaging of  the abdomen and pelvis was performed following the standard protocol without IV contrast. COMPARISON:  12/01/2017 CT abdomen pelvis FINDINGS: LOWER CHEST: Left basilar atelectasis. HEPATOBILIARY: Normal hepatic contours and density. No intra- or extrahepatic biliary dilatation. Status post cholecystectomy. PANCREAS: Diffuse fatty replacement of the pancreas. SPLEEN: Normal. ADRENALS/URINARY TRACT: --Adrenal glands: Normal. --Right kidney/ureter: Unchanged 4.6  cm renal cyst. There is hyperdense material in the posterior aspect of the renal pelvis. There is periureteral inflammation and mild hydroureteronephrosis. There is a 2 mm stone in the distal right ureter (series 2 image 69). --Left kidney/ureter: No hydronephrosis, nephroureterolithiasis, perinephric stranding or solid renal mass. --Urinary bladder: Normal for degree of distention STOMACH/BOWEL: --Stomach/Duodenum: No hiatal hernia or other gastric abnormality. Normal duodenal course. --Small bowel: No dilatation or inflammation. --Colon: Rectosigmoid diverticulosis without acute inflammation. --Appendix: Not visualized. No right lower quadrant inflammation or free fluid. VASCULAR/LYMPHATIC: Atherosclerotic calcification is present within the non-aneurysmal abdominal aorta, without hemodynamically significant stenosis. No abdominal or pelvic lymphadenopathy. REPRODUCTIVE: Status post hysterectomy. No adnexal mass. MUSCULOSKELETAL. Multilevel degenerative disc disease and facet arthrosis. No bony spinal canal stenosis. 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 OTHER: None. IMPRESSION: 1. Persistent right-sided obstructive uropathy with 2 mm stone in the distal right ureter causing moderate hydroureteronephrosis and perinephric and periureteral inflammatory stranding. 2. Rectosigmoid diverticulosis without acute inflammation. The mild pericolonic inflammatory change seen on the prior study has resolved. 3.  Aortic Atherosclerosis (ICD10-I70.0).  Electronically Signed   By: Ulyses Jarred M.D.   On: 12/04/2017 05:53    ------  Assessment:  84 y.o. female with MS, A-fib on Eliquis, OSA, and CHF, who presents with an obstructing 27mm right distal ureteral stone with concern for infection.   We discussed the etiology, natural history, and treatment options for her obstructing right ureteral stone in the setting of possible infection.  We discussed right ureteral stent placement versus right nephrostomy tube placement, as well as their associated risks and benefits.  Currently she is stable with normal vitals, and looks quite well.  Would recommend stent placement as long as anesthesia feels that is safe for her to be under general anesthesia.  We discussed that in any case, she will need to return in 1-2 weeks for definitive stone treatment, however we will arrange pending her hospital course  Recommendations: - NPO for right ureteral stent placement   Thank you for this consult. Please contact the urology consult pager with any further questions/concerns.

## 2017-12-04 NOTE — Op Note (Signed)
Preoperative diagnosis: Right hydronephrosis, nausea and vomiting, right ureteral stone Postoperative diagnosis: Same  Procedure: Cystoscopy with right retrograde pyelogram, right ureteral stent placement  Surgeon: Junious Silk  Anesthesia: General  Indication for procedure: 84 year old passing a 2-3 mm right distal stone who had failure to progress and passed the stone on repeat CT.  She also had continued nausea vomiting and chills with significant periureteral and perinephric stranding developing.  She was brought for an urgent stent.  I should add I discussed stent pain among other complications with the patient.  Findings: There was no stone in the bladder on cystoscopy but there was dark, debris laden urine emanating from the right ureteral orifice.  Right retrograde pyelogram-this outlined a single ureter single collecting system unit.  The ureter was normal for the first few centimeters of the distal ureter and there was a significant transition point to moderate to severe hydroureteronephrosis all the way up.  There may have been a small filling defect consistent with the stone.  After the wire and stent were placed, copious amount of dark urine drained from the right side.  Description of procedure: After consent was obtained patient brought to the operating room.  After adequate anesthesia she was placed in lithotomy position and prepped and draped in the usual sterile fashion.  A timeout was performed to confirm patient and procedure.  She was given a dose of Cipro  The cystoscope was passed per urethra and the bladder inspected.  A 6 [place] open-ended had difficulty cannulating the orifice so I used a cone-tip catheter.  Retrograde injection of contrast was performed.  I could only get contrast into the proximal ureter.  Then advanced the wire into the proximal ureter and the 6 [place] open-ended over the wire into the proximal ureter.  I removed the wire and there was a brisk hydronephrotic  drip.  Gentle contrast injection outlined a severely dilated collecting system.  The wire was then re-coiled in the upper pole and the 6 [place] open-ended catheter removed.  A 6 x 24 cm stent was advanced.  The wire was removed with a good coil seen in the upper pole calyx and a good coil in the bladder.  Stent was draining a good amount.  A 16 French Foley catheter was placed to be left to gravity drainage to max drain the system and prevent reflux overnight.  She was awakened and taken to the recovery room in stable condition.  Complications: None  Blood loss: Minimal  Specimens: None  Drains: 6 x 24 cm right ureteral stent, Foley catheter  Disposition: Patient stable to PACU

## 2017-12-04 NOTE — Anesthesia Procedure Notes (Signed)
Procedure Name: LMA Insertion Performed by: Zayden Hahne J, CRNA Pre-anesthesia Checklist: Patient identified, Emergency Drugs available, Suction available, Patient being monitored and Timeout performed Patient Re-evaluated:Patient Re-evaluated prior to induction Oxygen Delivery Method: Circle system utilized Preoxygenation: Pre-oxygenation with 100% oxygen Induction Type: IV induction Ventilation: Mask ventilation without difficulty LMA: LMA inserted LMA Size: 4.0 Number of attempts: 1 Placement Confirmation: positive ETCO2,  CO2 detector and breath sounds checked- equal and bilateral Tube secured with: Tape Dental Injury: Teeth and Oropharynx as per pre-operative assessment        

## 2017-12-04 NOTE — H&P (Signed)
History and Physical    [name] Case SEG:[id number] DOB: [birth year] DOA: 12/03/2017  PCP: Hoyt Koch, MD  Patient coming from: Home  I have personally briefly reviewed patient's old medical records in [place]  Chief Complaint: Emesis, near syncope  HPI: [name] Case is a 84 y.o. female with medical history significant of HTN, HLD, A.FIB on eliquis, OSA, diastolic CHF.  Patient was seen in the ED x2 days ago with R sided 65mm obstructing kidney stone.  Continues to have R flank pain.  She developed N/V and had near syncopal episode.  Got zofran and tamsulosin from urologist today.  Tramadol had been giving adequate pain relief until this morning when pain became worse.   ED Course: Pain relieved with Morphine.  Labs show mild new AKI with creat 1.4 up from 0.9 x2 days ago.  Most concerning however is that UA now suggestive of UTI with mod leuk esterase and TMTC WBCs a change from x2 days ago.  Patient put on rocephin.  CT scan performed which confirms R hydro still present, and radiologist is calling perinephric and periureteral inflammatory stranding on today's study.   Review of Systems: As per HPI otherwise 10 point review of systems negative.   Past Medical History:  Diagnosis Date  . Allergy   . Asthma   . Atrial fibrillation ([place])   . Bronchitis   . CHF (congestive heart failure) ([place])   . Diabetes mellitus without complication ([place])   . GERD (gastroesophageal reflux disease)   . Hyperlipidemia   . Hypertension   . Thyroid disease     Past Surgical History:  Procedure Laterality Date  . ABDOMINAL HYSTERECTOMY    . APPENDECTOMY    . CHOLECYSTECTOMY    . TOTAL KNEE ARTHROPLASTY       reports that she has never smoked. She has never used smokeless tobacco. She reports that she does not drink alcohol or use drugs.  Allergies  Allergen Reactions  . Codeine Hives and Rash    In cough syrup preparations    Family History  Problem Relation  Age of Onset  . Heart disease Mother   . Colon cancer Mother      Prior to Admission medications   Medication Sig Start Date End Date Taking? Authorizing Provider  albuterol (PROVENTIL) (2.5 MG/3ML) 0.083% nebulizer solution Take 3 mLs (2.5 mg total) by nebulization every 6 (six) hours as needed for wheezing or shortness of breath. 10/28/17  Yes Danford, Suann Larry, MD  apixaban (ELIQUIS) 5 MG TABS tablet Take 1 tablet (5 mg total) by mouth 2 (two) times daily. 11/04/17  Yes Hoyt Koch, MD  atorvastatin (LIPITOR) 20 MG tablet TAKE 1 TABLET BY MOUTH DAILY AT 6PM 07/01/17  Yes Hoyt Koch, MD  diltiazem (CARDIZEM CD) 180 MG 24 hr capsule Take 1 capsule (180 mg total) by mouth daily. 10/15/17  Yes Hoyt Koch, MD  furosemide (LASIX) 80 MG tablet TAKE 1/2 TABLET BY MOUTH DAILY 10/05/17  Yes Hoyt Koch, MD  irbesartan (AVAPRO) 150 MG tablet Take 0.5 tablets (75 mg total) by mouth daily. 06/03/17  Yes Hoyt Koch, MD  levothyroxine (SYNTHROID, LEVOTHROID) 100 MCG tablet TAKE 1 TABLET BY MOUTH ONCE DAILY 12/03/16  Yes Hoyt Koch, MD  metoprolol succinate (TOPROL-XL) 50 MG 24 hr tablet TAKE 1 TABLET BY MOUTH DAILY, TAKE WITH OR IMMEDIATELY FOLLOWING A MEAL 08/03/17  Yes Jerline Pain, MD  ondansetron (ZOFRAN) 4 MG tablet  Take 1 tablet by mouth every 6 (six) hours as needed for nausea.  12/03/17  Yes [provider]  potassium chloride (K-DUR) 10 MEQ tablet Take 1 tablet (10 mEq total) by mouth daily. 08/20/17  Yes Jerline Pain, MD  tamsulosin (FLOMAX) 0.4 MG CAPS capsule Take 1 capsule by mouth daily. 12/03/17  Yes [provider]  traMADol (ULTRAM) 50 MG tablet Take 1 tablet (50 mg total) by mouth every 6 (six) hours as needed. 12/01/17  Yes Carmin Muskrat, MD    Physical Exam: Vitals:   12/03/17 2333 12/03/17 2339 12/03/17 2340 12/04/17 0442  BP:  114/68  (!) 96/56  Pulse:  93  (!) 110  Resp:  18  20  Temp:  98.6 F (37  C)  98.7 F (37.1 C)  TempSrc:  Oral  Oral  SpO2: 97% (!) 88% 97% 96%  Height:   5\' 3"  (1.6 m)     Constitutional: NAD, calm, comfortable Eyes: PERRL, lids and conjunctivae normal ENMT: Mucous membranes are moist. Posterior pharynx clear of any exudate or lesions.Normal dentition.  Neck: normal, supple, no masses, no thyromegaly Respiratory: clear to auscultation bilaterally, no wheezing, no crackles. Normal respiratory effort. No accessory muscle use.  Cardiovascular: Regular rate and rhythm, no murmurs / rubs / gallops. No extremity edema. 2+ pedal pulses. No carotid bruits.  Abdomen: no tenderness, no masses palpated. No hepatosplenomegaly. Bowel sounds positive.  Musculoskeletal: no clubbing / cyanosis. No joint deformity upper and lower extremities. Good ROM, no contractures. Normal muscle tone.  Skin: no rashes, lesions, ulcers. No induration Neurologic: CN 2-12 grossly intact. Sensation intact, DTR normal. Strength 5/5 in all 4.  Psychiatric: Normal judgment and insight. Alert and oriented x 3. Normal mood.    Labs on Admission: I have personally reviewed following labs and imaging studies  CBC: Recent Labs  Lab 12/01/17 1154 12/04/17 0236  WBC 7.7 11.4*  NEUTROABS  --  9.7*  HGB 13.5 11.2*  HCT 41.5 35.3*  MCV 98.1 97.5  PLT 251 176   Basic Metabolic Panel: Recent Labs  Lab 12/01/17 1154 12/04/17 0236  NA 140 134*  K 4.4 4.0  CL 105 96*  CO2 26 30  GLUCOSE 148* 152*  BUN 14 21*  CREATININE 0.92 1.46*  CALCIUM 9.0 8.6*   GFR: CrCl cannot be calculated (Unknown ideal weight.). Liver Function Tests: Recent Labs  Lab 12/01/17 1154 12/04/17 0236  AST 27 17  ALT 17 14  ALKPHOS 76 63  BILITOT 1.2 0.8  PROT 7.1 6.2*  ALBUMIN 3.6 3.1*   Recent Labs  Lab 12/01/17 1154 12/04/17 0236  LIPASE 21 18   No results for input(s): AMMONIA in the last 168 hours. Coagulation Profile: No results for input(s): INR, PROTIME in the last 168 hours. Cardiac  Enzymes: No results for input(s): CKTOTAL, CKMB, CKMBINDEX, TROPONINI in the last 168 hours. BNP (last 3 results) No results for input(s): PROBNP in the last 8760 hours. HbA1C: No results for input(s): HGBA1C in the last 72 hours. CBG: No results for input(s): GLUCAP in the last 168 hours. Lipid Profile: No results for input(s): CHOL, HDL, LDLCALC, TRIG, CHOLHDL, LDLDIRECT in the last 72 hours. Thyroid Function Tests: No results for input(s): TSH, T4TOTAL, FREET4, T3FREE, THYROIDAB in the last 72 hours. Anemia Panel: No results for input(s): VITAMINB12, FOLATE, FERRITIN, TIBC, IRON, RETICCTPCT in the last 72 hours. Urine analysis:    Component Value Date/Time   COLORURINE YELLOW 12/04/2017 [place] 12/04/2017  [place] 1.008 12/04/2017 0243   PHURINE 6.0 12/04/2017 0243   GLUCOSEU NEGATIVE 12/04/2017 0243   HGBUR LARGE (A) 12/04/2017 0243   BILIRUBINUR NEGATIVE 12/04/2017 0243   BILIRUBINUR n 07/01/2012 1115   [place] 12/04/2017 0243   PROTEINUR 30 (A) 12/04/2017 0243   UROBILINOGEN 0.2 01/20/2015 1153   NITRITE NEGATIVE 12/04/2017 0243   LEUKOCYTESUR MODERATE (A) 12/04/2017 0243    Radiological Exams on Admission: Ct Renal Stone Study  Result Date: 12/04/2017 CLINICAL DATA:  Right flank pain EXAM: CT ABDOMEN AND PELVIS WITHOUT CONTRAST TECHNIQUE: Multidetector CT imaging of the abdomen and pelvis was performed following the standard protocol without IV contrast. COMPARISON:  12/01/2017 CT abdomen pelvis FINDINGS: LOWER CHEST: Left basilar atelectasis. HEPATOBILIARY: Normal hepatic contours and density. No intra- or extrahepatic biliary dilatation. Status post cholecystectomy. PANCREAS: Diffuse fatty replacement of the pancreas. SPLEEN: Normal. ADRENALS/URINARY TRACT: --Adrenal glands: Normal. --Right kidney/ureter: Unchanged 4.6 cm renal cyst. There is hyperdense material in the posterior aspect of the renal pelvis. There is periureteral  inflammation and mild hydroureteronephrosis. There is a 2 mm stone in the distal right ureter (series 2 image 69). --Left kidney/ureter: No hydronephrosis, nephroureterolithiasis, perinephric stranding or solid renal mass. --Urinary bladder: Normal for degree of distention STOMACH/BOWEL: --Stomach/Duodenum: No hiatal hernia or other gastric abnormality. Normal duodenal course. --Small bowel: No dilatation or inflammation. --Colon: Rectosigmoid diverticulosis without acute inflammation. --Appendix: Not visualized. No right lower quadrant inflammation or free fluid. VASCULAR/LYMPHATIC: Atherosclerotic calcification is present within the non-aneurysmal abdominal aorta, without hemodynamically significant stenosis. No abdominal or pelvic lymphadenopathy. REPRODUCTIVE: Status post hysterectomy. No adnexal mass. MUSCULOSKELETAL. Multilevel degenerative disc disease and facet arthrosis. No bony spinal canal stenosis. 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 OTHER: None. IMPRESSION: 1. Persistent right-sided obstructive uropathy with 2 mm stone in the distal right ureter causing moderate hydroureteronephrosis and perinephric and periureteral inflammatory stranding. 2. Rectosigmoid diverticulosis without acute inflammation. The mild pericolonic inflammatory change seen on the prior study has resolved. 3.  Aortic Atherosclerosis (ICD10-I70.0). Electronically Signed   By: Ulyses Jarred M.D.   On: 12/04/2017 05:53    EKG: Independently reviewed.  Assessment/Plan Principal Problem:   Pyohydronephrosis Active Problems:   Hypothyroidism   Essential hypertension   Atrial fibrillation (HCC)   Type 2 diabetes mellitus without complication (HCC)   Chronic CHF (congestive heart failure) (HCC)   Obesity hypoventilation syndrome ([place])    1. Pyohydronephrosis - not yet septic or febrile however has significant risk of worsening. 1. Rocephin 2. Admit to SDU 3. IVF: NS at 125 cc/hr 4. Have called urology who are  coming to see patient and presumably decide on stent vs perc neph tube and timing. 1. Keeping patient NPO 2. Holding eliquis 2. Hypothyroidism - change PO synthroid to IV 3. HTN - Holding all home BP meds in setting of acute illness 4. A.Fib - 1. Hold eliquis 2. Holding PO metoprolol and cardizem 3. Use short acting IV metoprolol and cardizem gtt if needed to rate control during acute illness. 5. DM2 - 1. Looks to be diet controlled at baseline 2. Will put on sensitive SSI Q4H for the moment 6. HVOS 1. Monitor O2 sat 2. CPAP PRN ordered  DVT prophylaxis: SCDs only for now Code Status: DNR - discussed with patient and family.  Is okay with pressors, is okay with very short term intubation for say a procedure. Family Communication: Family at bedside Disposition Plan: Home after admit Consults called: Spoke with Dr. Jess Barters with urology Admission  status: Admit to inpatient   Etta Quill DO [facility] Pager [phone]  If 7AM-7PM, please contact day team taking care of patient [email or web address] Password [facility]  12/04/2017, 6:22 AM

## 2017-12-04 NOTE — Anesthesia Preprocedure Evaluation (Addendum)
Anesthesia Evaluation  Patient identified by MRN, date of birth, ID band Patient awake    Reviewed: Allergy & Precautions, NPO status , Patient's Chart, lab work & pertinent test results, reviewed documented beta blocker date and time   Airway Mallampati: II  TM Distance: >3 FB Neck ROM: Full    Dental  (+) Edentulous Upper, Edentulous Lower   Pulmonary asthma ,  Obesity hypoventilation syndrome   Pulmonary exam normal breath sounds clear to auscultation       Cardiovascular hypertension, Pt. on medications and Pt. on home beta blockers +CHF  Normal cardiovascular exam+ dysrhythmias Atrial Fibrillation  Rhythm:Irregular Rate:Normal     Neuro/Psych negative neurological ROS  negative psych ROS   GI/Hepatic Neg liver ROS, GERD  Medicated and Controlled,  Endo/Other  diabetes, Poorly Controlled, Type 2, Oral Hypoglycemic AgentsHypothyroidism Morbid obesityHyperlipidemia  Renal/GU Renal InsufficiencyRenal diseaseRight ureteral calculus  negative genitourinary   Musculoskeletal negative musculoskeletal ROS (+)   Abdominal (+) + obese,   Peds  Hematology Eliquis therapy- last dose last pm   Anesthesia Other Findings   Reproductive/Obstetrics                       Anesthesia Physical Anesthesia Plan  ASA: III  Anesthesia Plan: General   Post-op Pain Management:    Induction: Intravenous  PONV Risk Score and Plan: Ondansetron, Dexamethasone and Treatment may vary due to age or medical condition  Airway Management Planned: LMA  Additional Equipment:   Intra-op Plan:   Post-operative Plan: Extubation in OR  Informed Consent: I have reviewed the patients History and Physical, chart, labs and discussed the procedure including the risks, benefits and alternatives for the proposed anesthesia with the patient or authorized representative who has indicated his/her understanding and acceptance.    Dental advisory given  Plan Discussed with: CRNA and Anesthesiologist  Anesthesia Plan Comments:         Anesthesia Quick Evaluation

## 2017-12-04 NOTE — Anesthesia Postprocedure Evaluation (Signed)
Anesthesia Post Note  Patient: [name] Case  Procedure(s) Performed: CYSTOSCOPY WITH RETROGRADE PYELOGRAM/URETERAL STENT PLACEMENT (Right )     Patient location during evaluation: PACU Anesthesia Type: General Level of consciousness: awake and alert and oriented Pain management: pain level controlled Vital Signs Assessment: post-procedure vital signs reviewed and stable Respiratory status: spontaneous breathing, nonlabored ventilation, respiratory function stable and patient connected to nasal cannula oxygen Cardiovascular status: blood pressure returned to baseline and stable Postop Assessment: no apparent nausea or vomiting Anesthetic complications: no    Last Vitals:  Vitals:   12/04/17 1745 12/04/17 1800  BP: (!) 163/79 120/70  Pulse: (!) 110 (!) 106  Resp: 15 16  Temp:    SpO2: 93% 92%    Last Pain:  Vitals:   12/04/17 1800  TempSrc:   PainSc: 0-No pain                 Daemon Dowty A.

## 2017-12-04 NOTE — ED Notes (Signed)
Pt was dx with a 41mm kidney stone Tuesday, she was given tramadol, zofran, and flomax to help pass the stone, pt was in severe pain this evening again and almost passed out Pt is also under a lot of stress and is suppose to be in court tomorrow and doesn't think she can physically make it

## 2017-12-04 NOTE — Transfer of Care (Signed)
Immediate Anesthesia Transfer of Care Note  Patient: [name] Case  Procedure(s) Performed: CYSTOSCOPY WITH RETROGRADE PYELOGRAM/URETERAL STENT PLACEMENT (Right )  Patient Location: PACU  Anesthesia Type:General  Level of Consciousness: sedated, patient cooperative and responds to stimulation  Airway & Oxygen Therapy: Patient Spontanous Breathing and Patient connected to face mask oxygen  Post-op Assessment: Report given to RN and Post -op Vital signs reviewed and stable  Post vital signs: Reviewed and stable  Last Vitals:  Vitals Value Taken Time  BP 148/64 12/04/2017  5:30 PM  Temp 36.9 C 12/04/2017  5:30 PM  Pulse 140 12/04/2017  5:34 PM  Resp 13 12/04/2017  5:34 PM  SpO2 91 % 12/04/2017  5:34 PM  Vitals shown include unvalidated device data.  Last Pain:  Vitals:   12/04/17 1320  TempSrc:   PainSc: 4          Complications: No apparent anesthesia complications

## 2017-12-04 NOTE — Progress Notes (Signed)
Patient is a 84 year old female with past medical history of hypertension, hyperlipidemia, A. fib on Eliquis, OSA, diastolic CHF who presented to the emergency department with complaints of right flank pain, nausea and vomiting.  Patient was also found to have acute kidney injury.  Her UA was suggestive of UTI.  CT imaging performed in the emergency department showed persistent right-sided obstructive uropathy with 2 mm stone in the distal right ureter causing moderate hydroureteronephrosis and perinephric and periureteral inflammatory stranding. Patient was admitted for the management of possible pyelonephritis, hydronephrosis.  Started on antibiotics and fluids for acute kidney injury. Urology consulted and she is undergoing right-sided ureteral stent placement today. Patient seen and examined the bedside in the emergency department.  Currently she looks more comfortable.  Her pain has improved.  She was afebrile during my evaluation.  She was hemodynamically stable. Updated the current plan with the family on the bedside. We will continue to monitor the patient.

## 2017-12-05 ENCOUNTER — Encounter (HOSPITAL_COMMUNITY): Payer: Self-pay | Admitting: Urology

## 2017-12-05 DIAGNOSIS — N136 Pyonephrosis: Principal | ICD-10-CM

## 2017-12-05 LAB — BASIC METABOLIC PANEL
ANION GAP: 7 (ref 5–15)
BUN: 16 mg/dL (ref 6–20)
CALCIUM: 8.2 mg/dL — AB (ref 8.9–10.3)
CO2: 26 mmol/L (ref 22–32)
Chloride: 104 mmol/L (ref 101–111)
Creatinine, Ser: 1.04 mg/dL — ABNORMAL HIGH (ref 0.44–1.00)
GFR calc Af Amer: 56 mL/min — ABNORMAL LOW (ref >60)
GFR, EST NON AFRICAN AMERICAN: 48 mL/min — AB (ref >60)
GLUCOSE: 154 mg/dL — AB (ref 65–99)
Potassium: 4.6 mmol/L (ref 3.5–5.1)
Sodium: 137 mmol/L (ref 135–145)

## 2017-12-05 LAB — CBC WITH DIFFERENTIAL/PLATELET
BASOS ABS: 0 10*3/uL (ref 0.0–0.1)
Basophils Relative: 0 %
EOS PCT: 0 %
Eosinophils Absolute: 0 10*3/uL (ref 0.0–0.7)
HCT: 35.4 % — ABNORMAL LOW (ref 36.0–46.0)
Hemoglobin: 11 g/dL — ABNORMAL LOW (ref 12.0–15.0)
LYMPHS PCT: 4 %
Lymphs Abs: 0.4 10*3/uL — ABNORMAL LOW (ref 0.7–4.0)
MCH: 31.1 pg (ref 26.0–34.0)
MCHC: 31.1 g/dL (ref 30.0–36.0)
MCV: 100 fL (ref 78.0–100.0)
MONO ABS: 0.2 10*3/uL (ref 0.1–1.0)
Monocytes Relative: 2 %
Neutro Abs: 9.9 10*3/uL — ABNORMAL HIGH (ref 1.7–7.7)
Neutrophils Relative %: 94 %
PLATELETS: 191 10*3/uL (ref 150–400)
RBC: 3.54 MIL/uL — ABNORMAL LOW (ref 3.87–5.11)
RDW: 14 % (ref 11.5–15.5)
WBC: 10.6 10*3/uL — ABNORMAL HIGH (ref 4.0–10.5)

## 2017-12-05 LAB — GLUCOSE, CAPILLARY
GLUCOSE-CAPILLARY: 130 mg/dL — AB (ref 65–99)
GLUCOSE-CAPILLARY: 125 mg/dL — AB (ref 65–99)
GLUCOSE-CAPILLARY: 129 mg/dL — AB (ref 65–99)
GLUCOSE-CAPILLARY: 158 mg/dL — AB (ref 65–99)

## 2017-12-05 LAB — URINE CULTURE

## 2017-12-05 MED ORDER — IRBESARTAN 75 MG PO TABS
75.0000 mg | ORAL_TABLET | Freq: Every day | ORAL | Status: DC
  Administered 2017-12-05 – 2017-12-07 (×3): 75 mg via ORAL
  Filled 2017-12-05 (×3): qty 1

## 2017-12-05 MED ORDER — METOPROLOL SUCCINATE ER 50 MG PO TB24
50.0000 mg | ORAL_TABLET | Freq: Every day | ORAL | Status: DC
  Administered 2017-12-05 – 2017-12-07 (×3): 50 mg via ORAL
  Filled 2017-12-05 (×2): qty 1
  Filled 2017-12-05: qty 2

## 2017-12-05 MED ORDER — DILTIAZEM HCL ER COATED BEADS 180 MG PO CP24
180.0000 mg | ORAL_CAPSULE | Freq: Every day | ORAL | Status: DC
  Administered 2017-12-05 – 2017-12-07 (×3): 180 mg via ORAL
  Filled 2017-12-05 (×3): qty 1

## 2017-12-05 MED ORDER — TAMSULOSIN HCL 0.4 MG PO CAPS
0.4000 mg | ORAL_CAPSULE | Freq: Every day | ORAL | Status: DC
  Administered 2017-12-05 – 2017-12-07 (×3): 0.4 mg via ORAL
  Filled 2017-12-05 (×3): qty 1

## 2017-12-05 MED ORDER — ATORVASTATIN CALCIUM 10 MG PO TABS
20.0000 mg | ORAL_TABLET | Freq: Every day | ORAL | Status: DC
  Administered 2017-12-05 – 2017-12-06 (×2): 20 mg via ORAL
  Filled 2017-12-05 (×2): qty 2

## 2017-12-05 MED ORDER — LEVOTHYROXINE SODIUM 100 MCG PO TABS
100.0000 ug | ORAL_TABLET | Freq: Every day | ORAL | Status: DC
  Administered 2017-12-05 – 2017-12-07 (×3): 100 ug via ORAL
  Filled 2017-12-05 (×3): qty 1

## 2017-12-05 MED ORDER — TRAZODONE HCL 50 MG PO TABS
50.0000 mg | ORAL_TABLET | Freq: Once | ORAL | Status: AC
Start: 2017-12-05 — End: 2017-12-06
  Administered 2017-12-06: 50 mg via ORAL
  Filled 2017-12-05: qty 1

## 2017-12-05 NOTE — Progress Notes (Signed)
1 Day Post-Op Subjective: Patient reports she feels much better.   Objective: Vital signs in last 24 hours: Temp:  [97.6 F (36.4 C)-99.6 F (37.6 C)] 98.3 F (36.8 C) (04/06 1350) Pulse Rate:  [82-118] 82 (04/06 1350) Resp:  [10-23] 16 (04/06 1350) BP: (97-163)/(47-83) 109/63 (04/06 1350) SpO2:  [84 %-100 %] 97 % (04/06 1350)  Intake/Output from previous day: 04/05 0701 - 04/06 0700 In: 2300 [I.V.:2200; IV Piggyback:100] Out: 450 [Urine:450] Intake/Output this shift: Total I/O In: 1329.2 [P.O.:600; I.V.:729.2] Out: 350 [Urine:350]  Physical Exam:  NAD She looks comfortable and face much brighter GU - urine pink in tubing   Lab Results: Recent Labs    12/04/17 0236 12/05/17 0331  HGB 11.2* 11.0*  HCT 35.3* 35.4*   BMET Recent Labs    12/04/17 0236 12/05/17 0331  NA 134* 137  K 4.0 4.6  CL 96* 104  CO2 30 26  GLUCOSE 152* 154*  BUN 21* 16  CREATININE 1.46* 1.04*  CALCIUM 8.6* 8.2*   No results for input(s): LABPT, INR in the last 72 hours. No results for input(s): LABURIN in the last 72 hours. Results for orders placed or performed during the hospital encounter of 12/03/17  Urine culture     Status: Abnormal   Collection Time: 12/04/17  2:43 AM  Result Value Ref Range Status   Specimen Description   Final    URINE, CLEAN CATCH Performed at [facility], [place] [address], [place], [place] [address]    Special Requests   Final    NONE Performed at [facility], [place] [address] [place], [place] [address]    Culture (A)  Final    <10,000 COLONIES/mL INSIGNIFICANT GROWTH Performed at [place] [address] [place], [place] [address]    Report Status 12/05/2017 FINAL  Final  MRSA PCR Screening     Status: None   Collection Time: 12/04/17 11:15 AM  Result Value Ref Range Status   MRSA by PCR NEGATIVE NEGATIVE Final    Comment:        The GeneXpert MRSA Assay (FDA approved for NASAL specimens only),  is one component of a comprehensive MRSA colonization surveillance program. It is not intended to diagnose MRSA infection nor to guide or monitor treatment for MRSA infections. Performed at [facility], [place] [address], [place], [place] [address]     Studies/Results: Dg C-arm 1-60 Min-no Report  Result Date: 12/04/2017 Fluoroscopy was utilized by the requesting physician.  No radiographic interpretation.   Ct Renal Stone Study  Result Date: 12/04/2017 CLINICAL DATA:  Right flank pain EXAM: CT ABDOMEN AND PELVIS WITHOUT CONTRAST TECHNIQUE: Multidetector CT imaging of the abdomen and pelvis was performed following the standard protocol without IV contrast. COMPARISON:  12/01/2017 CT abdomen pelvis FINDINGS: LOWER CHEST: Left basilar atelectasis. HEPATOBILIARY: Normal hepatic contours and density. No intra- or extrahepatic biliary dilatation. Status post cholecystectomy. PANCREAS: Diffuse fatty replacement of the pancreas. SPLEEN: Normal. ADRENALS/URINARY TRACT: --Adrenal glands: Normal. --Right kidney/ureter: Unchanged 4.6 cm renal cyst. There is hyperdense material in the posterior aspect of the renal pelvis. There is periureteral inflammation and mild hydroureteronephrosis. There is a 2 mm stone in the distal right ureter (series 2 image 69). --Left kidney/ureter: No hydronephrosis, nephroureterolithiasis, perinephric stranding or solid renal mass. --Urinary bladder: Normal for degree of distention STOMACH/BOWEL: --Stomach/Duodenum: No hiatal hernia or other gastric abnormality. Normal duodenal course. --Small bowel: No dilatation or inflammation. --Colon: Rectosigmoid diverticulosis without  acute inflammation. --Appendix: Not visualized. No right lower quadrant inflammation or free fluid. VASCULAR/LYMPHATIC: Atherosclerotic calcification is present within the non-aneurysmal abdominal aorta, without hemodynamically significant stenosis. No abdominal or pelvic lymphadenopathy.  REPRODUCTIVE: Status post hysterectomy. No adnexal mass. MUSCULOSKELETAL. Multilevel degenerative disc disease and facet arthrosis. No bony spinal canal stenosis. 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 OTHER: None. IMPRESSION: 1. Persistent right-sided obstructive uropathy with 2 mm stone in the distal right ureter causing moderate hydroureteronephrosis and perinephric and periureteral inflammatory stranding. 2. Rectosigmoid diverticulosis without acute inflammation. The mild pericolonic inflammatory change seen on the prior study has resolved. 3.  Aortic Atherosclerosis (ICD10-I70.0). Electronically Signed   By: Ulyses Jarred M.D.   On: 12/04/2017 05:53    Assessment/Plan: -Right ureteral stone, right hydronephrosis-status post right ureteral stent.  She is feeling much better and white count and creatinine are down.  She will follow-up with Dr. Louis Meckel as planned.  Discussed need for ureteroscopy to check the ureter.  I will sign off.  Please page GU with any questions or concerns or changes in patient's status.  Follow-up on chart.   LOS: 1 day   Festus Aloe 12/05/2017, 3:10 PM

## 2017-12-05 NOTE — Progress Notes (Signed)
Pt has arrived to room 1505 from ICU. Granddaughter present. No c/o.

## 2017-12-05 NOTE — Progress Notes (Signed)
PROGRESS NOTE    SYNDEY Case  YNW:[id number] DOB: 1931-12-07 DOA: 12/03/2017 PCP: Hoyt Koch, MD    Brief Narrative:  84 year old with past medical history relevant for hypertension, hyperlipidemia, paroxysmal atrial fibrillation on apixaban, obstructive sleep apnea, hypothyroidism who presented to the ED with right flank pain, nausea/vomiting, AK I and found to have right-sided obstructive uropathy status post ureteral stent placement on 12/04/2017.   Assessment & Plan:   Principal Problem:   Pyohydronephrosis Active Problems:   Hypothyroidism   Essential hypertension   Atrial fibrillation (HCC)   Type 2 diabetes mellitus without complication (HCC)   Chronic CHF (congestive heart failure) (HCC)   Obesity hypoventilation syndrome (HCC)   #) AK I and UTI secondary to obstructive uropathy: Status post ureteral stent placement on 12/04/2017 and right ureter.  Patient is much improved however she continues to have bloody/dark red urine output from her Foley -Continue Foley pending urology recommendations -Continue ceftriaxone 1 g daily -Discontinue IV fluids as patient is tolerating p.o. - Follow-up urine culture obtained 12/04/2017 - Continue tamsulosin 0.4 mg daily  #) Paroxysmal atrial fibrillation: -Hold apixaban -Restart home metoprolol succinate 50 mg daily -Restart home diltiazem 180 mg daily  #) Hypertension: - Continue your irbesartan 75mg  daily  #) Hypothyroidism: -Continue home levothyroxine 100 mcg daily  #) hyperlipidemia: -Continue atorvastatin 20 mg nightly  #) Obstructive sleep apnea: -Continue home CPAP  Fluids: Tolerating p.o. Electrolytes: Monitor and supplement Nutrition:  Prophylaxis: SCDs  Disposition: Pending discontinuation of Foley and speciation of bacteria in urine  DO NOT RESUSCITATE    Consultants:   Urology  Procedures: (Don't include imaging studies which can be auto populated. Include things that cannot be auto  populated i.e. Echo, Carotid and venous dopplers, Foley, Bipap, HD, tubes/drains, wound vac, central lines etc)  12/04/2017 ureteral stent placement on right ureter  Antimicrobials: (specify start and planned stop date. Auto populated tables are space occupying and do not give end dates)  Ceftriaxone started 12/04/2017   Subjective: Patient reports that she is not having any pain.  She reports that she has been getting pain medication quite regularly.  She otherwise is requesting getting out of bed and ambulating a little bit.  Objective: Vitals:   12/05/17 0200 12/05/17 0300 12/05/17 0400 12/05/17 0600  BP: 134/77 139/83  133/79  Pulse: (!) 102 (!) 112  (!) 117  Resp: 17 12  (!) 23  Temp:   98.1 F (36.7 C)   TempSrc:   Oral   SpO2: 94% 100%  (!) 84%  Weight:      Height:        Intake/Output Summary (Last 24 hours) at 12/05/2017 1014 Last data filed at 12/05/2017 0500 Gross per 24 hour  Intake 2200 ml  Output 450 ml  Net 1750 ml   Filed Weights   12/04/17 1109  Weight: 103.3 kg (227 lb 11.8 oz)    Examination:  General exam: Appears calm and comfortable  Respiratory system: Clear to auscultation. Respiratory effort normal. Cardiovascular system: Distant heart sounds, no murmurs Gastrointestinal system: Abdomen is nondistended, soft and nontender.  Central nervous system: Alert and oriented. No focal neurological deficits. Extremities: 1+ lower extremity edema Skin: No rashes on visible skin Psychiatry: Judgement and insight appear normal. Mood & affect appropriate.     Data Reviewed: I have personally reviewed following labs and imaging studies  CBC: Recent Labs  Lab 12/01/17 1154 12/04/17 0236 12/05/17 0331  WBC 7.7 11.4* 10.6*  NEUTROABS  --  9.7* 9.9*  HGB 13.5 11.2* 11.0*  HCT 41.5 35.3* 35.4*  MCV 98.1 97.5 100.0  PLT 251 209 619   Basic Metabolic Panel: Recent Labs  Lab 12/01/17 1154 12/04/17 0236 12/05/17 0331  NA 140 134* 137  K 4.4 4.0 4.6    CL 105 96* 104  CO2 26 30 26   GLUCOSE 148* 152* 154*  BUN 14 21* 16  CREATININE 0.92 1.46* 1.04*  CALCIUM 9.0 8.6* 8.2*   GFR: Estimated Creatinine Clearance: 44.5 mL/min (A) (by C-G formula based on SCr of 1.04 mg/dL (H)). Liver Function Tests: Recent Labs  Lab 12/01/17 1154 12/04/17 0236  AST 27 17  ALT 17 14  ALKPHOS 76 63  BILITOT 1.2 0.8  PROT 7.1 6.2*  ALBUMIN 3.6 3.1*   Recent Labs  Lab 12/01/17 1154 12/04/17 0236  LIPASE 21 18   No results for input(s): AMMONIA in the last 168 hours. Coagulation Profile: No results for input(s): INR, PROTIME in the last 168 hours. Cardiac Enzymes: No results for input(s): CKTOTAL, CKMB, CKMBINDEX, TROPONINI in the last 168 hours. BNP (last 3 results) No results for input(s): PROBNP in the last 8760 hours. HbA1C: No results for input(s): HGBA1C in the last 72 hours. CBG: Recent Labs  Lab 12/04/17 1128 12/04/17 1733 12/04/17 2030  GLUCAP 105* 130* 117*   Lipid Profile: No results for input(s): CHOL, HDL, LDLCALC, TRIG, CHOLHDL, LDLDIRECT in the last 72 hours. Thyroid Function Tests: No results for input(s): TSH, T4TOTAL, FREET4, T3FREE, THYROIDAB in the last 72 hours. Anemia Panel: No results for input(s): VITAMINB12, FOLATE, FERRITIN, TIBC, IRON, RETICCTPCT in the last 72 hours. Sepsis Labs: No results for input(s): PROCALCITON, LATICACIDVEN in the last 168 hours.  Recent Results (from the past 240 hour(s))  Urine culture     Status: Abnormal   Collection Time: 12/04/17  2:43 AM  Result Value Ref Range Status   Specimen Description   Final    URINE, CLEAN CATCH Performed at [facility], [place] [address], [place], [place] [address]    Special Requests   Final    NONE Performed at [facility], [place] [address], [place], [place] [address]    Culture (A)  Final    <10,000 COLONIES/mL INSIGNIFICANT GROWTH Performed at [place] [address], [place], [place]  [address]    Report Status 12/05/2017 FINAL  Final  MRSA PCR Screening     Status: None   Collection Time: 12/04/17 11:15 AM  Result Value Ref Range Status   MRSA by PCR NEGATIVE NEGATIVE Final    Comment:        The GeneXpert MRSA Assay (FDA approved for NASAL specimens only), is one component of a comprehensive MRSA colonization surveillance program. It is not intended to diagnose MRSA infection nor to guide or monitor treatment for MRSA infections. Performed at [facility], [place] [address], [place], [place] [address]          Radiology Studies: Dg C-arm 1-60 Min-no Report  Result Date: 12/04/2017 Fluoroscopy was utilized by the requesting physician.  No radiographic interpretation.   Ct Renal Stone Study  Result Date: 12/04/2017 CLINICAL DATA:  Right flank pain EXAM: CT ABDOMEN AND PELVIS WITHOUT CONTRAST TECHNIQUE: Multidetector CT imaging of the abdomen and pelvis was performed following the standard protocol without IV contrast. COMPARISON:  12/01/2017 CT abdomen pelvis FINDINGS: LOWER CHEST: Left basilar atelectasis. HEPATOBILIARY: Normal hepatic contours and density. No intra- or extrahepatic biliary  dilatation. Status post cholecystectomy. PANCREAS: Diffuse fatty replacement of the pancreas. SPLEEN: Normal. ADRENALS/URINARY TRACT: --Adrenal glands: Normal. --Right kidney/ureter: Unchanged 4.6 cm renal cyst. There is hyperdense material in the posterior aspect of the renal pelvis. There is periureteral inflammation and mild hydroureteronephrosis. There is a 2 mm stone in the distal right ureter (series 2 image 69). --Left kidney/ureter: No hydronephrosis, nephroureterolithiasis, perinephric stranding or solid renal mass. --Urinary bladder: Normal for degree of distention STOMACH/BOWEL: --Stomach/Duodenum: No hiatal hernia or other gastric abnormality. Normal duodenal course. --Small bowel: No dilatation or inflammation. --Colon: Rectosigmoid diverticulosis without  acute inflammation. --Appendix: Not visualized. No right lower quadrant inflammation or free fluid. VASCULAR/LYMPHATIC: Atherosclerotic calcification is present within the non-aneurysmal abdominal aorta, without hemodynamically significant stenosis. No abdominal or pelvic lymphadenopathy. REPRODUCTIVE: Status post hysterectomy. No adnexal mass. MUSCULOSKELETAL. Multilevel degenerative disc disease and facet arthrosis. No bony spinal canal stenosis. 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 OTHER: None. IMPRESSION: 1. Persistent right-sided obstructive uropathy with 2 mm stone in the distal right ureter causing moderate hydroureteronephrosis and perinephric and periureteral inflammatory stranding. 2. Rectosigmoid diverticulosis without acute inflammation. The mild pericolonic inflammatory change seen on the prior study has resolved. 3.  Aortic Atherosclerosis (ICD10-I70.0). Electronically Signed   By: Ulyses Jarred M.D.   On: 12/04/2017 05:53        Scheduled Meds: . insulin aspart  0-9 Units Subcutaneous TID WC  . levothyroxine  50 mcg Intravenous Daily   Continuous Infusions: . sodium chloride 125 mL/hr at 12/04/17 0647  . cefTRIAXone (ROCEPHIN)  IV Stopped (12/04/17 2147)     LOS: 1 day    Time spent: [place], MD [facility]  If 7PM-7AM, please contact night-coverage [email or web address] Password [email or web address] 12/05/2017, 10:14 AM

## 2017-12-06 ENCOUNTER — Inpatient Hospital Stay (HOSPITAL_COMMUNITY): Payer: Medicare Other

## 2017-12-06 LAB — BASIC METABOLIC PANEL WITH GFR
BUN: 23 mg/dL — ABNORMAL HIGH (ref 6–20)
Creatinine, Ser: 1 mg/dL (ref 0.44–1.00)
GFR calc non Af Amer: 50 mL/min — ABNORMAL LOW (ref >60)
Sodium: 139 mmol/L (ref 135–145)

## 2017-12-06 LAB — CBC
HCT: 31.9 % — ABNORMAL LOW (ref 36.0–46.0)
Hemoglobin: 10 g/dL — ABNORMAL LOW (ref 12.0–15.0)
MCH: 30.7 pg (ref 26.0–34.0)
MCHC: 31.3 g/dL (ref 30.0–36.0)
MCV: 97.9 fL (ref 78.0–100.0)
Platelets: 213 K/uL (ref 150–400)
RBC: 3.26 MIL/uL — ABNORMAL LOW (ref 3.87–5.11)
RDW: 14 % (ref 11.5–15.5)
WBC: 12.3 K/uL — ABNORMAL HIGH (ref 4.0–10.5)

## 2017-12-06 LAB — GLUCOSE, CAPILLARY
GLUCOSE-CAPILLARY: 126 mg/dL — AB (ref 65–99)
GLUCOSE-CAPILLARY: 106 mg/dL — AB (ref 65–99)
Glucose-Capillary: 116 mg/dL — ABNORMAL HIGH (ref 65–99)

## 2017-12-06 LAB — BASIC METABOLIC PANEL
Anion gap: 6 (ref 5–15)
CO2: 29 mmol/L (ref 22–32)
Calcium: 8.4 mg/dL — ABNORMAL LOW (ref 8.9–10.3)
Chloride: 104 mmol/L (ref 101–111)
GFR calc Af Amer: 58 mL/min — ABNORMAL LOW (ref >60)
Glucose, Bld: 129 mg/dL — ABNORMAL HIGH (ref 65–99)
Potassium: 4.1 mmol/L (ref 3.5–5.1)

## 2017-12-06 LAB — MAGNESIUM: Magnesium: 1.6 mg/dL — ABNORMAL LOW (ref 1.7–2.4)

## 2017-12-06 MED ORDER — FUROSEMIDE 10 MG/ML IJ SOLN
80.0000 mg | Freq: Once | INTRAMUSCULAR | Status: AC
  Administered 2017-12-06: 80 mg via INTRAVENOUS
  Filled 2017-12-06: qty 8

## 2017-12-06 MED ORDER — FUROSEMIDE 40 MG PO TABS
40.0000 mg | ORAL_TABLET | Freq: Every day | ORAL | Status: DC
  Administered 2017-12-06 – 2017-12-07 (×2): 40 mg via ORAL
  Filled 2017-12-06 (×2): qty 1

## 2017-12-06 MED ORDER — MAGNESIUM SULFATE 2 GM/50ML IV SOLN
2.0000 g | Freq: Once | INTRAVENOUS | Status: AC
  Administered 2017-12-06: 2 g via INTRAVENOUS
  Filled 2017-12-06: qty 50

## 2017-12-06 MED ORDER — TRAZODONE HCL 100 MG PO TABS
100.0000 mg | ORAL_TABLET | Freq: Every evening | ORAL | Status: DC | PRN
  Administered 2017-12-06: 100 mg via ORAL
  Filled 2017-12-06: qty 1

## 2017-12-06 MED ORDER — APIXABAN 5 MG PO TABS
5.0000 mg | ORAL_TABLET | Freq: Two times a day (BID) | ORAL | Status: DC
  Administered 2017-12-06 – 2017-12-07 (×3): 5 mg via ORAL
  Filled 2017-12-06 (×3): qty 1

## 2017-12-06 NOTE — Progress Notes (Signed)
PT Cancellation Note  Patient Details Name: [name] Case MRN: [id number] DOB: 1932/03/29   Cancelled Treatment:    Reason Eval/Treat Not Completed: PT screened, no needs identified, will sign off; spoke with RN, NT,  pt refuses PT, RN and NT confirm pt is amb to bathroom and amb in hallway yesterday with her cane; pt seems to be at her basline--signing off   [facility] 12/06/2017, 12:29 PM

## 2017-12-06 NOTE — Progress Notes (Signed)
PROGRESS NOTE    [name] Case  GTX:[id number] DOB: 1932/02/16 DOA: 12/03/2017 PCP: Hoyt Koch, MD    Brief Narrative:  84 year old with past medical history relevant for hypertension, hyperlipidemia, paroxysmal atrial fibrillation on apixaban, obstructive sleep apnea, hypothyroidism who presented to the ED with right flank pain, nausea/vomiting, AK I and found to have right-sided obstructive uropathy status post ureteral stent placement on 12/04/2017.   Assessment & Plan:   Principal Problem:   Pyohydronephrosis Active Problems:   Hypothyroidism   Essential hypertension   Atrial fibrillation (HCC)   Type 2 diabetes mellitus without complication (HCC)   Chronic CHF (congestive heart failure) (HCC)   Obesity hypoventilation syndrome (HCC)   #) AK I and UTI secondary to obstructive uropathy: Status post ureteral stent placement on 12/04/2017 and right ureter.  A KI is resolved. -Discontinue Foley pending urology recommendations -Discontinue ceftriaxone 1 g daily as urine culture is negative -  urine culture obtained 12/04/2017 less than 10,000 colonies - Continue tamsulosin 0.4 mg daily  #) Paroxysmal atrial fibrillation: -Restart apixaban -Continue home metoprolol succinate 50 mg daily -Continue home diltiazem 180 mg daily  #) Hypertension: - Continue home irbesartan 75mg  daily -Restart home furosemide 80 mg daily  #) Hypothyroidism: -Continue home levothyroxine 100 mcg daily  #) hyperlipidemia: -Continue atorvastatin 20 mg nightly  #) Obstructive sleep apnea: -Continue home CPAP  Fluids: Tolerating p.o. Electrolytes: Monitor and supplement Nutrition:  Prophylaxis: SCDs  Disposition: Pending weaning to room air  DO NOT RESUSCITATE    Consultants:   Urology  Procedures: (Don't include imaging studies which can be auto populated. Include things that cannot be auto populated i.e. Echo, Carotid and venous dopplers, Foley, Bipap, HD, tubes/drains,  wound vac, central lines etc)  12/04/2017 ureteral stent placement on right ureter  Antimicrobials: (specify start and planned stop date. Auto populated tables are space occupying and do not give end dates)  Ceftriaxone  12/04/2017 to 12/06/2017   Subjective: Patient reports that she is much improved.  She reports that her breathing and ambulation is improved.  She is eager to go home..  Objective: Vitals:   12/05/17 1200 12/05/17 1300 12/05/17 1350 12/05/17 2043  BP: 138/79 97/67 109/63 120/68  Pulse: 96 91 82 99  Resp: 17 16 16 17   Temp: 97.6 F (36.4 C)  98.3 F (36.8 C) 98.1 F (36.7 C)  TempSrc: Oral  Oral Oral  SpO2: 100% 100% 97% 98%  Weight:      Height:        Intake/Output Summary (Last 24 hours) at 12/06/2017 1044 Last data filed at 12/05/2017 1754 Gross per 24 hour  Intake 1729.17 ml  Output 750 ml  Net 979.17 ml   Filed Weights   12/04/17 1109  Weight: 103.3 kg (227 lb 11.8 oz)    Examination:  General exam: Appears calm and comfortable  Respiratory system: Clear to auscultation. Respiratory effort normal.  Crackles at bases Cardiovascular system: Distant heart sounds, no murmurs Gastrointestinal system: Abdomen is nondistended, soft and nontender.  Central nervous system: Alert and oriented. No focal neurological deficits. Extremities: 1+ lower extremity edema Skin: No rashes on visible skin Psychiatry: Judgement and insight appear normal. Mood & affect appropriate.     Data Reviewed: I have personally reviewed following labs and imaging studies  CBC: Recent Labs  Lab 12/01/17 1154 12/04/17 0236 12/05/17 0331 12/06/17 0515  WBC 7.7 11.4* 10.6* 12.3*  NEUTROABS  --  9.7* 9.9*  --   HGB 13.5 11.2* 11.0*  10.0*  HCT 41.5 35.3* 35.4* 31.9*  MCV 98.1 97.5 100.0 97.9  PLT 251 209 191 941   Basic Metabolic Panel: Recent Labs  Lab 12/01/17 1154 12/04/17 0236 12/05/17 0331 12/06/17 0515  NA 140 134* 137 139  K 4.4 4.0 4.6 4.1  CL 105 96* 104 104   CO2 26 30 26 29   GLUCOSE 148* 152* 154* 129*  BUN 14 21* 16 23*  CREATININE 0.92 1.46* 1.04* 1.00  CALCIUM 9.0 8.6* 8.2* 8.4*  MG  --   --   --  1.6*   GFR: Estimated Creatinine Clearance: 46.3 mL/min (by C-G formula based on SCr of 1 mg/dL). Liver Function Tests: Recent Labs  Lab 12/01/17 1154 12/04/17 0236  AST 27 17  ALT 17 14  ALKPHOS 76 63  BILITOT 1.2 0.8  PROT 7.1 6.2*  ALBUMIN 3.6 3.1*   Recent Labs  Lab 12/01/17 1154 12/04/17 0236  LIPASE 21 18   No results for input(s): AMMONIA in the last 168 hours. Coagulation Profile: No results for input(s): INR, PROTIME in the last 168 hours. Cardiac Enzymes: No results for input(s): CKTOTAL, CKMB, CKMBINDEX, TROPONINI in the last 168 hours. BNP (last 3 results) No results for input(s): PROBNP in the last 8760 hours. HbA1C: No results for input(s): HGBA1C in the last 72 hours. CBG: Recent Labs  Lab 12/05/17 0741 12/05/17 1212 12/05/17 1631 12/05/17 2041 12/06/17 0718  GLUCAP 130* 125* 129* 158* 126*   Lipid Profile: No results for input(s): CHOL, HDL, LDLCALC, TRIG, CHOLHDL, LDLDIRECT in the last 72 hours. Thyroid Function Tests: No results for input(s): TSH, T4TOTAL, FREET4, T3FREE, THYROIDAB in the last 72 hours. Anemia Panel: No results for input(s): VITAMINB12, FOLATE, FERRITIN, TIBC, IRON, RETICCTPCT in the last 72 hours. Sepsis Labs: No results for input(s): PROCALCITON, LATICACIDVEN in the last 168 hours.  Recent Results (from the past 240 hour(s))  Urine culture     Status: Abnormal   Collection Time: 12/04/17  2:43 AM  Result Value Ref Range Status   Specimen Description   Final    URINE, CLEAN CATCH Performed at [facility], [place] [address] [place], [place] [address]    Special Requests   Final    NONE Performed at [facility], [place] [address], [place], [place] [address]    Culture (A)  Final    <10,000 COLONIES/mL INSIGNIFICANT GROWTH Performed at  [place] [address] [place], [place] [address]    Report Status 12/05/2017 FINAL  Final  MRSA PCR Screening     Status: None   Collection Time: 12/04/17 11:15 AM  Result Value Ref Range Status   MRSA by PCR NEGATIVE NEGATIVE Final    Comment:        The GeneXpert MRSA Assay (FDA approved for NASAL specimens only), is one component of a comprehensive MRSA colonization surveillance program. It is not intended to diagnose MRSA infection nor to guide or monitor treatment for MRSA infections. Performed at [facility], [place] [address] [place], [place] [address]          Radiology Studies: Dg C-arm 1-60 Min-no Report  Result Date: 12/04/2017 Fluoroscopy was utilized by the requesting physician.  No radiographic interpretation.        Scheduled Meds: . apixaban  5 mg Oral BID  . atorvastatin  20 mg Oral q1800  . diltiazem  180 mg Oral Daily  . furosemide  40 mg Oral Daily  .  insulin aspart  0-9 Units Subcutaneous TID WC  . irbesartan  75 mg Oral Daily  . levothyroxine  100 mcg Oral QAC breakfast  . metoprolol succinate  50 mg Oral Daily  . tamsulosin  0.4 mg Oral Daily   Continuous Infusions:    LOS: 2 days    Time spent: [place], MD [facility]  If 7PM-7AM, please contact night-coverage [email or web address] Password [facility] 12/06/2017, 10:44 AM

## 2017-12-07 LAB — GLUCOSE, CAPILLARY
Glucose-Capillary: 140 mg/dL — ABNORMAL HIGH (ref 65–99)
Glucose-Capillary: 106 mg/dL — ABNORMAL HIGH (ref 65–99)
Glucose-Capillary: 147 mg/dL — ABNORMAL HIGH (ref 65–99)
Glucose-Capillary: 74 mg/dL (ref 65–99)

## 2017-12-07 LAB — CBC
HCT: 35.3 % — ABNORMAL LOW (ref 36.0–46.0)
Hemoglobin: 10.9 g/dL — ABNORMAL LOW (ref 12.0–15.0)
MCH: 30.5 pg (ref 26.0–34.0)
MCHC: 30.9 g/dL (ref 30.0–36.0)
MCV: 98.9 fL (ref 78.0–100.0)
Platelets: 237 10*3/uL (ref 150–400)
RBC: 3.57 MIL/uL — ABNORMAL LOW (ref 3.87–5.11)
RDW: 14.6 % (ref 11.5–15.5)
WBC: 9.1 10*3/uL (ref 4.0–10.5)

## 2017-12-07 MED ORDER — TRAMADOL HCL 50 MG PO TABS: 50.0000 mg | ORAL_TABLET | Freq: Four times a day (QID) | ORAL | 0 refills | Status: DC | PRN

## 2017-12-07 NOTE — Discharge Summary (Signed)
Physician Discharge Summary  [name] Case BJY:[id number] DOB: 01-Sep-1932 DOA: 12/03/2017  PCP: Hoyt Koch, MD  Admit date: 12/03/2017 Discharge date: 12/07/2017  Admitted From: Home Disposition: Home  Recommendations for Outpatient Follow-up:  1. Follow up with PCP in 1-2 weeks 2. Please obtain BMP/CBC in one week   Home Health: No Equipment/Devices: None  Discharge Condition: Stable CODE STATUS: DO NOT RESUSCITATE Diet recommendation: Heart Healthy  Brief/Interim Summary:  #) Nephrolithiasis causing hydronephrosis: Patient was admitted with worsening hydronephrosis, nausea, vomiting and right-sided flank pain.  She was admitted with mild AK I.  CT scan showed right hydronephrosis which was still present as well as increasing inflammatory stranding around the ureters and kidney.  Urology was consulted and placed a right ureteral stent on 12/04/2017.  Culture from this did not grow anything.  She received several days of ceftriaxone IV and was discharged home on no antibiotics.  Patient was continued on tamsulosin.  #) AK I: This was thought to be secondary to likely obstructing urinary stone as well as nausea and vomiting.  She was given IV fluids and after placement of ureteral stent her AK I resolved.  #) fluid overload: Patient received significant amounts of IV fluids and did have a mild oxygen requirement with chest x-ray evidence of mild pulmonary edema and oxygen requirement.  She was given 1 dose of IV furosemide and restarted on her home furosemide 80 mg daily.  She was discharged on room air.  #) Paroxysmal atrial fibrillation: Patient's apixaban was held in the setting of ureteral stent implantation.  This was restarted prior to discharge.  She was noted to have some hematuria however this was clearing on discharge.  She is continued on her home metoprolol succinate and diltiazem dose.  #) Hypertension/hyperlipidemia: Patient was continued on home statin.  Her ARB was  held in setting of a KI and restarted on discharge when her creatinine normalized.  #)  hypothyroidism: Patient was continued on home levothyroxine dose.  Discharge Diagnoses:  Principal Problem:   Pyohydronephrosis Active Problems:   Hypothyroidism   Essential hypertension   Atrial fibrillation (HCC)   Type 2 diabetes mellitus without complication (HCC)   Chronic CHF (congestive heart failure) (HCC)   Obesity hypoventilation syndrome [facility])    Discharge Instructions  Discharge Instructions    Ambulatory referral to Urology   Complete by:  As directed    Call MD for:  difficulty breathing, headache or visual disturbances   Complete by:  As directed    Call MD for:  hives   Complete by:  As directed    Call MD for:  persistant nausea and vomiting   Complete by:  As directed    Call MD for:  redness, tenderness, or signs of infection (pain, swelling, redness, odor or green/yellow discharge around incision site)   Complete by:  As directed    Call MD for:  severe uncontrolled pain   Complete by:  As directed    Call MD for:  temperature >100.4   Complete by:  As directed    Diet - low sodium heart healthy   Complete by:  As directed    Discharge instructions   Complete by:  As directed    Please follow-up with your PCP in 1 week.  Please follow-up with urology as an outpatient for your stent.   Increase activity slowly   Complete by:  As directed      Allergies as of 12/07/2017  Reactions   Codeine Hives, Rash   In cough syrup preparations      Medication List    STOP taking these medications   potassium chloride 10 MEQ tablet Commonly known as:  K-DUR     TAKE these medications   albuterol (2.5 MG/3ML) 0.083% nebulizer solution Commonly known as:  PROVENTIL Take 3 mLs (2.5 mg total) by nebulization every 6 (six) hours as needed for wheezing or shortness of breath.   apixaban 5 MG Tabs tablet Commonly known as:  ELIQUIS Take 1 tablet (5 mg total) by mouth  2 (two) times daily.   atorvastatin 20 MG tablet Commonly known as:  LIPITOR TAKE 1 TABLET BY MOUTH DAILY AT 6PM   diltiazem 180 MG 24 hr capsule Commonly known as:  CARDIZEM CD Take 1 capsule (180 mg total) by mouth daily.   furosemide 80 MG tablet Commonly known as:  LASIX TAKE 1/2 TABLET BY MOUTH DAILY   irbesartan 150 MG tablet Commonly known as:  AVAPRO Take 0.5 tablets (75 mg total) by mouth daily.   levothyroxine 100 MCG tablet Commonly known as:  SYNTHROID, LEVOTHROID TAKE 1 TABLET BY MOUTH ONCE DAILY   metoprolol succinate 50 MG 24 hr tablet Commonly known as:  TOPROL-XL TAKE 1 TABLET BY MOUTH DAILY, TAKE WITH OR IMMEDIATELY FOLLOWING A MEAL   ondansetron 4 MG tablet Commonly known as:  ZOFRAN Take 1 tablet by mouth every 6 (six) hours as needed for nausea.   tamsulosin 0.4 MG Caps capsule Commonly known as:  FLOMAX Take 1 capsule by mouth daily.   traMADol 50 MG tablet Commonly known as:  ULTRAM Take 1 tablet (50 mg total) by mouth every 6 (six) hours as needed.      Follow-up Information    Ardis Hughs, MD On 12/16/2017.   Specialty:  Urology Why:  9:30 AM Contact information: [address] [place] [address] [phone]          Allergies  Allergen Reactions  . Codeine Hives and Rash    In cough syrup preparations    Consultations:  Urology   Procedures/Studies: Dg Chest Port 1 View  Result Date: 12/06/2017 CLINICAL DATA:  84 year old female with hypoxia EXAM: PORTABLE CHEST 1 VIEW COMPARISON:  Prior chest x-ray 10/26/2017 FINDINGS: Persistent cardiomegaly. Widening of the mediastinal soft tissues likely reflects venous vascular congestion. There is diffuse pulmonary vascular congestion. Lower inspiratory volumes with increased bibasilar atelectasis likely representing a combination of small layering effusions and atelectasis. No focal airspace consolidation. No evidence of pneumothorax. No acute osseous abnormality. IMPRESSION:  1. Lower inspiratory volumes with increased bibasilar airspace opacities favored to reflect a combination of small pleural effusions and atelectasis. 2. Slightly increased mediastinal widening favored to reflect venous vascular congestion. 3. Stable cardiomegaly. Electronically Signed   By: Jacqulynn Cadet M.D.   On: 12/06/2017 13:13   Dg C-arm 1-60 Min-no Report  Result Date: 12/04/2017 Fluoroscopy was utilized by the requesting physician.  No radiographic interpretation.   Ct Renal Stone Study  Result Date: 12/04/2017 CLINICAL DATA:  Right flank pain EXAM: CT ABDOMEN AND PELVIS WITHOUT CONTRAST TECHNIQUE: Multidetector CT imaging of the abdomen and pelvis was performed following the standard protocol without IV contrast. COMPARISON:  12/01/2017 CT abdomen pelvis FINDINGS: LOWER CHEST: Left basilar atelectasis. HEPATOBILIARY: Normal hepatic contours and density. No intra- or extrahepatic biliary dilatation. Status post cholecystectomy. PANCREAS: Diffuse fatty replacement of the pancreas. SPLEEN: Normal. ADRENALS/URINARY TRACT: --Adrenal glands: Normal. --Right kidney/ureter: Unchanged 4.6 cm  renal cyst. There is hyperdense material in the posterior aspect of the renal pelvis. There is periureteral inflammation and mild hydroureteronephrosis. There is a 2 mm stone in the distal right ureter (series 2 image 69). --Left kidney/ureter: No hydronephrosis, nephroureterolithiasis, perinephric stranding or solid renal mass. --Urinary bladder: Normal for degree of distention STOMACH/BOWEL: --Stomach/Duodenum: No hiatal hernia or other gastric abnormality. Normal duodenal course. --Small bowel: No dilatation or inflammation. --Colon: Rectosigmoid diverticulosis without acute inflammation. --Appendix: Not visualized. No right lower quadrant inflammation or free fluid. VASCULAR/LYMPHATIC: Atherosclerotic calcification is present within the non-aneurysmal abdominal aorta, without hemodynamically significant stenosis. No  abdominal or pelvic lymphadenopathy. REPRODUCTIVE: Status post hysterectomy. No adnexal mass. MUSCULOSKELETAL. Multilevel degenerative disc disease and facet arthrosis. No bony spinal canal stenosis. 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 OTHER: None. IMPRESSION: 1. Persistent right-sided obstructive uropathy with 2 mm stone in the distal right ureter causing moderate hydroureteronephrosis and perinephric and periureteral inflammatory stranding. 2. Rectosigmoid diverticulosis without acute inflammation. The mild pericolonic inflammatory change seen on the prior study has resolved. 3.  Aortic Atherosclerosis (ICD10-I70.0). Electronically Signed   By: Ulyses Jarred M.D.   On: 12/04/2017 05:53   Ct Renal Stone Study  Result Date: 12/01/2017 CLINICAL DATA:  Right flank pain and suprapubic pain with nausea and vomiting. EXAM: CT ABDOMEN AND PELVIS WITHOUT CONTRAST TECHNIQUE: Multidetector CT imaging of the abdomen and pelvis was performed following the standard protocol without IV contrast. COMPARISON:  01/20/2015 FINDINGS: Lower chest: Mild atelectasis and or patchy infiltrate at the lung bases left more than right. No pleural fluid. Hepatobiliary: Liver parenchyma does not show any significant finding. Few calcified granulomas. Previous cholecystectomy. Pancreas: Chronic fatty atrophy of the pancreas. No mass or inflammatory change. Spleen: Normal Adrenals/Urinary Tract: Adrenal glands are normal. Left kidney is normal with the exception of what may be a small stone in the renal pelvis without obstruction. The right kidney shows simple cyst at the upper pole measuring 4.7 cm in diameter. There is hydroureteronephrosis and renal swelling with the ureter being dilated to the mid pelvis where there is a 1-2 mm stone. No stone in the bladder. Stomach/Bowel: No bowel pathology seen in the upper abdomen. Patient has diverticulosis in the sigmoid colon. There is some stranding the fat which could go along with low  level diverticulitis. This was not seen previously. It could also represent scarring from a different episode. Vascular/Lymphatic: Aortic atherosclerosis. No aneurysm. IVC is normal. No retroperitoneal adenopathy. Reproductive: Previous hysterectomy.  No pelvic mass. Other: No free fluid or air. Small supraumbilical hernia containing only fat. Musculoskeletal: Chronic lumbar degenerative changes. Chronic pars defects at L5 with 4 mm of anterolisthesis. IMPRESSION: 1-2 mm stone in the right ureter at the mid pelvis level with right hydroureteronephrosis. Diverticulosis of the sigmoid colon. Stranding in the adjacent fat raising the possibility of low level diverticulitis. Alternatively, this could be chronic sequela of a previous episode. Aortic atherosclerosis. Patchy density at the lung bases left more than the right that could be atelectasis or pneumonia. Lumbar degenerative changes.  Chronic pars defects L5. Electronically Signed   By: Nelson Chimes M.D.   On: 12/01/2017 13:11    12/04/2017 right ureteral stent implantation:After consent was obtained patient brought to the operating room.  After adequate anesthesia she was placed in lithotomy position and prepped and draped in the usual sterile fashion.  A timeout was performed to confirm patient and procedure.  She was given a dose of Cipro  The cystoscope was passed per urethra  and the bladder inspected.  A 6 [place] open-ended had difficulty cannulating the orifice so I used a cone-tip catheter.  Retrograde injection of contrast was performed.  I could only get contrast into the proximal ureter.  Then advanced the wire into the proximal ureter and the 6 [place] open-ended over the wire into the proximal ureter.  I removed the wire and there was a brisk hydronephrotic drip.  Gentle contrast injection outlined a severely dilated collecting system.  The wire was then re-coiled in the upper pole and the 6 [place] open-ended catheter removed.  A 6 x 24 cm stent was  advanced.  The wire was removed with a good coil seen in the upper pole calyx and a good coil in the bladder.  Stent was draining a good amount.  A 16 French Foley catheter was placed to be left to gravity drainage to max drain the system and prevent reflux overnight.  She was awakened and taken to the recovery room in stable condition.     Subjective:   Discharge Exam: Vitals:   12/06/17 2053 12/07/17 0451  BP: (!) 118/59 112/60  Pulse: 85 94  Resp: 17 16  Temp: 98.2 F (36.8 C) 98.2 F (36.8 C)  SpO2: 97%    Vitals:   12/05/17 2043 12/06/17 1421 12/06/17 2053 12/07/17 0451  BP: 120/68 128/70 (!) 118/59 112/60  Pulse: 99 87 85 94  Resp: 17 16 17 16   Temp: 98.1 F (36.7 C) 97.6 F (36.4 C) 98.2 F (36.8 C) 98.2 F (36.8 C)  TempSrc: Oral Oral Oral Oral  SpO2: 98% 99% 97%   Weight:      Height:       General exam: Appears calm and comfortable  Respiratory system: Clear to auscultation. Respiratory effort normal.    No crackles or wheezes Cardiovascular system: Distant heart sounds, no murmurs Gastrointestinal system: Abdomen is nondistended, soft and nontender.  Central nervous system: Alert and oriented. No focal neurological deficits. Extremities: 1+ lower extremity edema Skin: No rashes on visible skin Psychiatry: Judgement and insight appear normal. Mood & affect appropriate.      The results of significant diagnostics from this hospitalization (including imaging, microbiology, ancillary and laboratory) are listed below for reference.     Microbiology: Recent Results (from the past 240 hour(s))  Urine culture     Status: Abnormal   Collection Time: 12/04/17  2:43 AM  Result Value Ref Range Status   Specimen Description   Final    URINE, CLEAN CATCH Performed at [facility], [place] [address], [place], [place] [address]    Special Requests   Final    NONE Performed at [facility], [place] [address] [place], [place]  [address]    Culture (A)  Final    <10,000 COLONIES/mL INSIGNIFICANT GROWTH Performed at [place] [address], [place], [place] [address]    Report Status 12/05/2017 FINAL  Final  MRSA PCR Screening     Status: None   Collection Time: 12/04/17 11:15 AM  Result Value Ref Range Status   MRSA by PCR NEGATIVE NEGATIVE Final    Comment:        The GeneXpert MRSA Assay (FDA approved for NASAL specimens only), is one component of a comprehensive MRSA colonization surveillance program. It is not intended to diagnose MRSA infection nor to guide or monitor treatment for MRSA infections. Performed at [facility], [place] [address], [place], [place] [address]  Labs: BNP (last 3 results) Recent Labs    02/04/17 2130 02/21/17 2032 10/27/17 0131  BNP 189.3* 217.4* 409.8*   Basic Metabolic Panel: Recent Labs  Lab 12/01/17 1154 12/04/17 0236 12/05/17 0331 12/06/17 0515  NA 140 134* 137 139  K 4.4 4.0 4.6 4.1  CL 105 96* 104 104  CO2 26 30 26 29   GLUCOSE 148* 152* 154* 129*  BUN 14 21* 16 23*  CREATININE 0.92 1.46* 1.04* 1.00  CALCIUM 9.0 8.6* 8.2* 8.4*  MG  --   --   --  1.6*   Liver Function Tests: Recent Labs  Lab 12/01/17 1154 12/04/17 0236  AST 27 17  ALT 17 14  ALKPHOS 76 63  BILITOT 1.2 0.8  PROT 7.1 6.2*  ALBUMIN 3.6 3.1*   Recent Labs  Lab 12/01/17 1154 12/04/17 0236  LIPASE 21 18   No results for input(s): AMMONIA in the last 168 hours. CBC: Recent Labs  Lab 12/01/17 1154 12/04/17 0236 12/05/17 0331 12/06/17 0515 12/07/17 0523  WBC 7.7 11.4* 10.6* 12.3* 9.1  NEUTROABS  --  9.7* 9.9*  --   --   HGB 13.5 11.2* 11.0* 10.0* 10.9*  HCT 41.5 35.3* 35.4* 31.9* 35.3*  MCV 98.1 97.5 100.0 97.9 98.9  PLT 251 209 191 213 237   Cardiac Enzymes: No results for input(s): CKTOTAL, CKMB, CKMBINDEX, TROPONINI in the last 168 hours. BNP: Invalid input(s): POCBNP CBG: Recent Labs  Lab 12/06/17 1157 12/06/17 1726  12/06/17 2051 12/07/17 0718 12/07/17 1125  GLUCAP 106* 116* 140* 106* 147*   D-Dimer No results for input(s): DDIMER in the last 72 hours. Hgb A1c No results for input(s): HGBA1C in the last 72 hours. Lipid Profile No results for input(s): CHOL, HDL, LDLCALC, TRIG, CHOLHDL, LDLDIRECT in the last 72 hours. Thyroid function studies No results for input(s): TSH, T4TOTAL, T3FREE, THYROIDAB in the last 72 hours.  Invalid input(s): FREET3 Anemia work up No results for input(s): VITAMINB12, FOLATE, FERRITIN, TIBC, IRON, RETICCTPCT in the last 72 hours. Urinalysis    Component Value Date/Time   COLORURINE YELLOW 12/04/2017 0243   APPEARANCEUR CLEAR 12/04/2017 0243   LABSPEC 1.008 12/04/2017 0243   PHURINE 6.0 12/04/2017 0243   GLUCOSEU NEGATIVE 12/04/2017 0243   HGBUR LARGE (A) 12/04/2017 0243   BILIRUBINUR NEGATIVE 12/04/2017 0243   BILIRUBINUR n 07/01/2012 1115   KETONESUR NEGATIVE 12/04/2017 0243   PROTEINUR 30 (A) 12/04/2017 0243   UROBILINOGEN 0.2 01/20/2015 1153   NITRITE NEGATIVE 12/04/2017 0243   LEUKOCYTESUR MODERATE (A) 12/04/2017 0243   Sepsis Labs Invalid input(s): PROCALCITONIN,  WBC,  LACTICIDVEN Microbiology Recent Results (from the past 240 hour(s))  Urine culture     Status: Abnormal   Collection Time: 12/04/17  2:43 AM  Result Value Ref Range Status   Specimen Description   Final    URINE, CLEAN CATCH Performed at [facility], [place] [address], [place], [place] [address]    Special Requests   Final    NONE Performed at [facility], [place] [address], [place], [place] [address]    Culture (A)  Final    <10,000 COLONIES/mL INSIGNIFICANT GROWTH Performed at [place] [address] [place], [place] [address]    Report Status 12/05/2017 FINAL  Final  MRSA PCR Screening     Status: None   Collection Time: 12/04/17 11:15 AM  Result Value Ref Range Status   MRSA by PCR NEGATIVE NEGATIVE Final    Comment:  The GeneXpert MRSA Assay (FDA approved for NASAL specimens only), is one component of a comprehensive MRSA colonization surveillance program. It is not intended to diagnose MRSA infection nor to guide or monitor treatment for MRSA infections. Performed at [facility], [place] [address], [place], [place] [address]      Time coordinating discharge: Over 30 minutes  SIGNED:   Cristy Folks, MD  [facility] 12/07/2017, 1:43 PM  If 7PM-7AM, please contact night-coverage [email or web address] Password [email or web address]

## 2017-12-07 NOTE — Care Management Important Message (Signed)
Important Message  Patient Details  Name: FAITHLYN RECKTENWALD MRN: [id number] Date of Birth: September 06, 1931   Medicare Important Message Given:  Yes    Kerin Salen 12/07/2017, 10:24 AMImportant Message  Patient Details  Name: PATSEY PITSTICK MRN: [id number] Date of Birth: 1931-12-31   Medicare Important Message Given:  Yes    Kerin Salen 12/07/2017, 10:24 AM

## 2017-12-07 NOTE — H&P (View-Only) (Signed)
Urology Inpatient Progress Report  Chronic anticoagulation [Z79.01] Acute kidney injury (nontraumatic) (HCC) [N17.9] Normochromic normocytic anemia [D64.9] Urinary tract infection with hematuria, site unspecified [N39.0, R31.9]  Procedure(s): CYSTOSCOPY WITH RETROGRADE PYELOGRAM/URETERAL STENT PLACEMENT  3 Days Post-Op   Intv/Subj: No acute events overnight. Patient is without complaint. The patient is feeling significantly better after the stent was placed.  However,she continues to have significant gross hematuria.  In addition, the patient  Has had some low oxygen saturation on room air.  Has not been working with incentive spirometry.   Principal Problem:   Pyohydronephrosis Active Problems:   Hypothyroidism   Essential hypertension   Atrial fibrillation (HCC)   Type 2 diabetes mellitus without complication (HCC)   Chronic CHF (congestive heart failure) (HCC)   Obesity hypoventilation syndrome (HCC)  Current Facility-Administered Medications  Medication Dose Route Frequency Provider Last Rate Last Dose  . acetaminophen (TYLENOL) tablet 650 mg  650 mg Oral Q6H PRN Festus Aloe, MD   650 mg at 12/07/17 0448   Or  . acetaminophen (TYLENOL) suppository 650 mg  650 mg Rectal Q6H PRN Festus Aloe, MD      . albuterol (PROVENTIL) (2.5 MG/3ML) 0.083% nebulizer solution 2.5 mg  2.5 mg Nebulization Q6H PRN Festus Aloe, MD      . apixaban Arne Cleveland) tablet 5 mg  5 mg Oral BID Purohit, Konrad Dolores, MD   5 mg at 12/07/17 0924  . atorvastatin (LIPITOR) tablet 20 mg  20 mg Oral q1800 Purohit, Shrey C, MD   20 mg at 12/06/17 1748  . diltiazem (CARDIZEM CD) 24 hr capsule 180 mg  180 mg Oral Daily Purohit, Shrey C, MD   180 mg at 12/07/17 0924  . furosemide (LASIX) tablet 40 mg  40 mg Oral Daily Purohit, Shrey C, MD   40 mg at 12/07/17 0924  . insulin aspart (novoLOG) injection 0-9 Units  0-9 Units Subcutaneous TID WC Kirby-Graham, Karsten Fells, NP   1 Units at 12/06/17 0800  .  irbesartan (AVAPRO) tablet 75 mg  75 mg Oral Daily Purohit, Shrey C, MD   75 mg at 12/07/17 0924  . levothyroxine (SYNTHROID, LEVOTHROID) tablet 100 mcg  100 mcg Oral QAC breakfast Purohit, Konrad Dolores, MD   100 mcg at 12/07/17 0924  . metoprolol succinate (TOPROL-XL) 24 hr tablet 50 mg  50 mg Oral Daily Purohit, Shrey C, MD   50 mg at 12/07/17 0924  . morphine 4 MG/ML injection 2-4 mg  2-4 mg Intravenous Q4H PRN Festus Aloe, MD   4 mg at 12/04/17 2107  . ondansetron (ZOFRAN) tablet 4 mg  4 mg Oral Q6H PRN Festus Aloe, MD       Or  . ondansetron [facility]) injection 4 mg  4 mg Intravenous Q6H PRN Festus Aloe, MD   4 mg at 12/04/17 1425  . tamsulosin (FLOMAX) capsule 0.4 mg  0.4 mg Oral Daily Purohit, Shrey C, MD   0.4 mg at 12/07/17 0924  . traZODone (DESYREL) tablet 100 mg  100 mg Oral QHS PRN Purohit, Shrey C, MD   100 mg at 12/06/17 2228     Objective: Vital: Vitals:   12/05/17 2043 12/06/17 1421 12/06/17 2053 12/07/17 0451  BP: 120/68 128/70 (!) 118/59 112/60  Pulse: 99 87 85 94  Resp: 17 16 17 16   Temp: 98.1 F (36.7 C) 97.6 F (36.4 C) 98.2 F (36.8 C) 98.2 F (36.8 C)  TempSrc: Oral Oral Oral Oral  SpO2: 98% 99% 97%   Weight:  Height:       I/Os: I/O last 3 completed shifts: In: 240 [P.O.:240] Out: 2900 [Urine:2600; Stool:300]  Physical Exam:  General: Patient is in no apparent distress Lungs: Normal respiratory effort, chest expands symmetrically. GI: The abdomen is soft and nontender without mass. Ext: lower extremities symmetric  Lab Results: Recent Labs    12/05/17 0331 12/06/17 0515 12/07/17 0523  WBC 10.6* 12.3* 9.1  HGB 11.0* 10.0* 10.9*  HCT 35.4* 31.9* 35.3*   Recent Labs    12/05/17 0331 12/06/17 0515  NA 137 139  K 4.6 4.1  CL 104 104  CO2 26 29  GLUCOSE 154* 129*  BUN 16 23*  CREATININE 1.04* 1.00  CALCIUM 8.2* 8.4*   No results for input(s): LABPT, INR in the last 72 hours. No results for input(s): LABURIN in the last  72 hours. Results for orders placed or performed during the hospital encounter of 12/03/17  Urine culture     Status: Abnormal   Collection Time: 12/04/17  2:43 AM  Result Value Ref Range Status   Specimen Description   Final    URINE, CLEAN CATCH Performed at [facility], [place] [address], [place], [place] [address]    Special Requests   Final    NONE Performed at [facility], [place] [address], [place], [place] [address]    Culture (A)  Final    <10,000 COLONIES/mL INSIGNIFICANT GROWTH Performed at [facility] [address] [place], [place] [address]    Report Status 12/05/2017 FINAL  Final  MRSA PCR Screening     Status: None   Collection Time: 12/04/17 11:15 AM  Result Value Ref Range Status   MRSA by PCR NEGATIVE NEGATIVE Final    Comment:        The GeneXpert MRSA Assay (FDA approved for NASAL specimens only), is one component of a comprehensive MRSA colonization surveillance program. It is not intended to diagnose MRSA infection nor to guide or monitor treatment for MRSA infections. Performed at [facility], [place] [address] [place], [place] [address]     Studies/Results: Dg Chest Port 1 View  Result Date: 12/06/2017 CLINICAL DATA:  84 year old female with hypoxia EXAM: PORTABLE CHEST 1 VIEW COMPARISON:  Prior chest x-ray 10/26/2017 FINDINGS: Persistent cardiomegaly. Widening of the mediastinal soft tissues likely reflects venous vascular congestion. There is diffuse pulmonary vascular congestion. Lower inspiratory volumes with increased bibasilar atelectasis likely representing a combination of small layering effusions and atelectasis. No focal airspace consolidation. No evidence of pneumothorax. No acute osseous abnormality. IMPRESSION: 1. Lower inspiratory volumes with increased bibasilar airspace opacities favored to reflect a combination of small pleural effusions and atelectasis. 2. Slightly increased  mediastinal widening favored to reflect venous vascular congestion. 3. Stable cardiomegaly. Electronically Signed   By: Jacqulynn Cadet M.D.   On: 12/06/2017 13:13    Assessment: Procedure(s): CYSTOSCOPY WITH RETROGRADE PYELOGRAM/URETERAL STENT PLACEMENT, 3 Days Post-Op  doing well.her  Oxygenation appears to be her main issue.  I don't think she has any urinary tract type infections.  The patient still needs her stone removed, and we will try to get this set up for her in the next 7-10 days.   Plan: Okay from a urology perspective for discharge home.  We will schedule her for a follow-up procedure in the near future.   Louis Meckel, MD Urology 12/07/2017, 12:29 PM

## 2017-12-07 NOTE — Progress Notes (Signed)
Urology Inpatient Progress Report  Chronic anticoagulation [Z79.01] Acute kidney injury (nontraumatic) (HCC) [N17.9] Normochromic normocytic anemia [D64.9] Urinary tract infection with hematuria, site unspecified [N39.0, R31.9]  Procedure(s): CYSTOSCOPY WITH RETROGRADE PYELOGRAM/URETERAL STENT PLACEMENT  3 Days Post-Op   Intv/Subj: No acute events overnight. Patient is without complaint. The patient is feeling significantly better after the stent was placed.  However,she continues to have significant gross hematuria.  In addition, the patient  Has had some low oxygen saturation on room air.  Has not been working with incentive spirometry.   Principal Problem:   Pyohydronephrosis Active Problems:   Hypothyroidism   Essential hypertension   Atrial fibrillation (HCC)   Type 2 diabetes mellitus without complication (HCC)   Chronic CHF (congestive heart failure) (HCC)   Obesity hypoventilation syndrome (HCC)  Current Facility-Administered Medications  Medication Dose Route Frequency Provider Last Rate Last Dose  . acetaminophen (TYLENOL) tablet 650 mg  650 mg Oral Q6H PRN Festus Aloe, MD   650 mg at 12/07/17 0448   Or  . acetaminophen (TYLENOL) suppository 650 mg  650 mg Rectal Q6H PRN Festus Aloe, MD      . albuterol (PROVENTIL) (2.5 MG/3ML) 0.083% nebulizer solution 2.5 mg  2.5 mg Nebulization Q6H PRN Festus Aloe, MD      . apixaban Arne Cleveland) tablet 5 mg  5 mg Oral BID Purohit, Konrad Dolores, MD   5 mg at 12/07/17 0924  . atorvastatin (LIPITOR) tablet 20 mg  20 mg Oral q1800 Purohit, Shrey C, MD   20 mg at 12/06/17 1748  . diltiazem (CARDIZEM CD) 24 hr capsule 180 mg  180 mg Oral Daily Purohit, Shrey C, MD   180 mg at 12/07/17 0924  . furosemide (LASIX) tablet 40 mg  40 mg Oral Daily Purohit, Shrey C, MD   40 mg at 12/07/17 0924  . insulin aspart (novoLOG) injection 0-9 Units  0-9 Units Subcutaneous TID WC Kirby-Graham, Karsten Fells, NP   1 Units at 12/06/17 0800  .  irbesartan (AVAPRO) tablet 75 mg  75 mg Oral Daily Purohit, Shrey C, MD   75 mg at 12/07/17 0924  . levothyroxine (SYNTHROID, LEVOTHROID) tablet 100 mcg  100 mcg Oral QAC breakfast Purohit, Konrad Dolores, MD   100 mcg at 12/07/17 0924  . metoprolol succinate (TOPROL-XL) 24 hr tablet 50 mg  50 mg Oral Daily Purohit, Shrey C, MD   50 mg at 12/07/17 0924  . morphine 4 MG/ML injection 2-4 mg  2-4 mg Intravenous Q4H PRN Festus Aloe, MD   4 mg at 12/04/17 2107  . ondansetron (ZOFRAN) tablet 4 mg  4 mg Oral Q6H PRN Festus Aloe, MD       Or  . ondansetron [facility]) injection 4 mg  4 mg Intravenous Q6H PRN Festus Aloe, MD   4 mg at 12/04/17 1425  . tamsulosin (FLOMAX) capsule 0.4 mg  0.4 mg Oral Daily Purohit, Shrey C, MD   0.4 mg at 12/07/17 0924  . traZODone (DESYREL) tablet 100 mg  100 mg Oral QHS PRN Purohit, Shrey C, MD   100 mg at 12/06/17 2228     Objective: Vital: Vitals:   12/05/17 2043 12/06/17 1421 12/06/17 2053 12/07/17 0451  BP: 120/68 128/70 (!) 118/59 112/60  Pulse: 99 87 85 94  Resp: 17 16 17 16   Temp: 98.1 F (36.7 C) 97.6 F (36.4 C) 98.2 F (36.8 C) 98.2 F (36.8 C)  TempSrc: Oral Oral Oral Oral  SpO2: 98% 99% 97%   Weight:  Height:       I/Os: I/O last 3 completed shifts: In: 240 [P.O.:240] Out: 2900 [Urine:2600; Stool:300]  Physical Exam:  General: Patient is in no apparent distress Lungs: Normal respiratory effort, chest expands symmetrically. GI: The abdomen is soft and nontender without mass. Ext: lower extremities symmetric  Lab Results: Recent Labs    12/05/17 0331 12/06/17 0515 12/07/17 0523  WBC 10.6* 12.3* 9.1  HGB 11.0* 10.0* 10.9*  HCT 35.4* 31.9* 35.3*   Recent Labs    12/05/17 0331 12/06/17 0515  NA 137 139  K 4.6 4.1  CL 104 104  CO2 26 29  GLUCOSE 154* 129*  BUN 16 23*  CREATININE 1.04* 1.00  CALCIUM 8.2* 8.4*   No results for input(s): LABPT, INR in the last 72 hours. No results for input(s): LABURIN in the last  72 hours. Results for orders placed or performed during the hospital encounter of 12/03/17  Urine culture     Status: Abnormal   Collection Time: 12/04/17  2:43 AM  Result Value Ref Range Status   Specimen Description   Final    URINE, CLEAN CATCH Performed at [facility], [place] [address], [place], [place] [address]    Special Requests   Final    NONE Performed at [facility], [place] [address] [place], [place] [address]    Culture (A)  Final    <10,000 COLONIES/mL INSIGNIFICANT GROWTH Performed at [facility] [address] [place], [place] [address]    Report Status 12/05/2017 FINAL  Final  MRSA PCR Screening     Status: None   Collection Time: 12/04/17 11:15 AM  Result Value Ref Range Status   MRSA by PCR NEGATIVE NEGATIVE Final    Comment:        The GeneXpert MRSA Assay (FDA approved for NASAL specimens only), is one component of a comprehensive MRSA colonization surveillance program. It is not intended to diagnose MRSA infection nor to guide or monitor treatment for MRSA infections. Performed at [facility], [place] [address] [place], [place] [address]     Studies/Results: Dg Chest Port 1 View  Result Date: 12/06/2017 CLINICAL DATA:  84 year old female with hypoxia EXAM: PORTABLE CHEST 1 VIEW COMPARISON:  Prior chest x-ray 10/26/2017 FINDINGS: Persistent cardiomegaly. Widening of the mediastinal soft tissues likely reflects venous vascular congestion. There is diffuse pulmonary vascular congestion. Lower inspiratory volumes with increased bibasilar atelectasis likely representing a combination of small layering effusions and atelectasis. No focal airspace consolidation. No evidence of pneumothorax. No acute osseous abnormality. IMPRESSION: 1. Lower inspiratory volumes with increased bibasilar airspace opacities favored to reflect a combination of small pleural effusions and atelectasis. 2. Slightly increased  mediastinal widening favored to reflect venous vascular congestion. 3. Stable cardiomegaly. Electronically Signed   By: Jacqulynn Cadet M.D.   On: 12/06/2017 13:13    Assessment: Procedure(s): CYSTOSCOPY WITH RETROGRADE PYELOGRAM/URETERAL STENT PLACEMENT, 3 Days Post-Op  doing well.her  Oxygenation appears to be her main issue.  I don't think she has any urinary tract type infections.  The patient still needs her stone removed, and we will try to get this set up for her in the next 7-10 days.   Plan: Okay from a urology perspective for discharge home.  We will schedule her for a follow-up procedure in the near future.   Louis Meckel, MD Urology 12/07/2017, 12:29 PM

## 2017-12-07 NOTE — Progress Notes (Signed)
pts room air O2 sat was 79-80% after being off O2 for about 20-17mins while using BSC. Replaced  O2 @1Lnc  with sats returning to low 90's.

## 2017-12-08 ENCOUNTER — Ambulatory Visit (INDEPENDENT_AMBULATORY_CARE_PROVIDER_SITE_OTHER): Payer: Medicare Other | Admitting: Internal Medicine

## 2017-12-08 ENCOUNTER — Other Ambulatory Visit: Payer: Self-pay | Admitting: Urology

## 2017-12-08 ENCOUNTER — Encounter: Payer: Self-pay | Admitting: Internal Medicine

## 2017-12-08 ENCOUNTER — Other Ambulatory Visit: Payer: Self-pay | Admitting: Internal Medicine

## 2017-12-08 DIAGNOSIS — N136 Pyonephrosis: Secondary | ICD-10-CM | POA: Diagnosis not present

## 2017-12-08 DIAGNOSIS — J9612 Chronic respiratory failure with hypercapnia: Secondary | ICD-10-CM | POA: Diagnosis not present

## 2017-12-08 DIAGNOSIS — I480 Paroxysmal atrial fibrillation: Secondary | ICD-10-CM | POA: Diagnosis not present

## 2017-12-08 DIAGNOSIS — J9601 Acute respiratory failure with hypoxia: Secondary | ICD-10-CM | POA: Diagnosis not present

## 2017-12-08 NOTE — Patient Instructions (Addendum)
We will have you take 1 pill of the lasix daily (80 mg daily) for 3 days. Then go back to taking 1/2 pill daily (40 mg) until you see the urologist.   It is okay to take the melatonin 2 pills at night time for sleep.   Start taking the eliquis again.

## 2017-12-08 NOTE — Progress Notes (Signed)
   Subjective:    Patient ID: [name] Case, female    DOB: 08/20/1932, 84 y.o.   MRN: [id number]  HPI The patient is an 84 YO female coming in for hospital follow up (in for uti, kidney stone, had cystoscopy and stent placement). She was treated with antibiotics and stent, feeling some better now. Denies urinary problems or pain. Denies fevers or chills. Denies side effects to medications. Denies nausea or vomiting or diarrhea or constipation. Mild constipation in the hospital due to medications for pain. Oxygen levels were still mildly low in the hospital. Denies SOB and is getting around okay at home. Still tired quickly. Eating and sleeping okay. Still slight blood in urine but dramatically less than after the procedure.   PMH, [facility], social history reviewed and updated.   Review of Systems  Constitutional: Positive for activity change and fatigue. Negative for appetite change.  HENT: Negative.   Eyes: Negative.   Respiratory: Negative for cough, chest tightness and shortness of breath.   Cardiovascular: Positive for leg swelling. Negative for chest pain and palpitations.  Gastrointestinal: Negative for abdominal distention, abdominal pain, constipation, diarrhea, nausea and vomiting.  Genitourinary: Positive for hematuria.  Musculoskeletal: Negative.   Skin: Negative.   Neurological: Negative.   Psychiatric/Behavioral: Negative.       Objective:   Physical Exam  Constitutional: She is oriented to person, place, and time. She appears well-developed and well-nourished.  HENT:  Head: Normocephalic and atraumatic.  Eyes: EOM are normal.  Neck: Normal range of motion.  Cardiovascular: Normal rate and regular rhythm.  Pulmonary/Chest: Effort normal and breath sounds normal. No respiratory distress. She has no wheezes. She has no rales.  Abdominal: Soft. Bowel sounds are normal. She exhibits no distension. There is no tenderness. There is no rebound.  Musculoskeletal: She exhibits  edema.  Neurological: She is alert and oriented to person, place, and time. Coordination normal.  Skin: Skin is warm and dry.  Psychiatric: She has a normal mood and affect.   Vitals:   12/08/17 1434  BP: (!) 100/58  Pulse: 78  Temp: 98 F (36.7 C)  TempSrc: Oral  SpO2: (!) 88%  Weight: 225 lb (102.1 kg)  Height: 5\' 1"  (1.549 m)      Assessment & Plan:

## 2017-12-10 ENCOUNTER — Telehealth: Payer: Self-pay | Admitting: Internal Medicine

## 2017-12-10 MED ORDER — TAMSULOSIN HCL 0.4 MG PO CAPS: 0.4000 mg | ORAL_CAPSULE | Freq: Every day | ORAL | 0 refills | Status: DC

## 2017-12-10 NOTE — Telephone Encounter (Signed)
Patient lost her flomax pills recently filled 12/03/17 can another refill be sent in?

## 2017-12-10 NOTE — Telephone Encounter (Signed)
Rx Refilled  

## 2017-12-10 NOTE — Telephone Encounter (Signed)
Copied from [facility] [id number]. Topic: Quick Communication - Rx Refill/Question >> Dec 10, 2017  2:12 PM Ahmed Prima L wrote: Medication: tamsulosin (FLOMAX) 0.4 MG CAPS capsule Has the patient contacted their pharmacy? Yes & the pharmacy told her she just filled this on 4/4 but she can not find her pills, she is not urinating enough because she has not had the medication. (Agent: If no, request that the patient contact the pharmacy for the refill.) Preferred Pharmacy (with phone number or street name):  Agent: Please be advised that RX refills may take up to 3 business days. We ask that you follow-up with your pharmacy.

## 2017-12-10 NOTE — Telephone Encounter (Signed)
Can send in #30 no refills, 1 pill daily.

## 2017-12-10 NOTE — Telephone Encounter (Signed)
LOV  12/08/17 Dr. Crawford 

## 2017-12-11 NOTE — Assessment & Plan Note (Addendum)
Oxygen levels low today but not hypoxic. Suspect some from fluid overload and asked to double lasix for 3 days.

## 2017-12-11 NOTE — Assessment & Plan Note (Signed)
Finished antibiotics and stent in place. Will follow up with urology for treatment and removal of stent.

## 2017-12-11 NOTE — Assessment & Plan Note (Signed)
Suspect today from fluid overload with fluids in the hospital. Increase lasix for 3 days to help.

## 2017-12-11 NOTE — Assessment & Plan Note (Signed)
Had not resumed eliquis but was advised to do so. Can be held if needed for procedure. Mild hematuria still but improving.

## 2017-12-16 NOTE — Patient Instructions (Addendum)
[name] Case  12/16/2017   Your procedure is scheduled on: 12-24-17   Report to [facility] Main  Entrance Report to Admitting at 5:30 AM    Call this number if you have problems the morning of surgery [phone]   Remember: Do not eat food or drink liquids :After Midnight.     Take these medicines the morning of surgery with A SIP OF WATER: Cardizem (Diltiazem), Levothyroxine (Synthroid), Metoprolol Succinate (Toprol-XL).                               You may not have any metal on your body including hair pins and              piercings  Do not wear jewelry, make-up, lotions, powders or perfumes, deodorant             Do not wear nail polish.  Do not shave  48 hours prior to surgery.                Do not bring valuables to the hospital. [place].  Contacts, dentures or bridgework may not be worn into surgery.     Patients discharged the day of surgery will not be allowed to drive home.  Name and phone number of your driver:Beth Tamala Julian [phone]                Please read over the following fact sheets you were given: _____________________________________________________________________             [facility] - Preparing for Surgery Before surgery, you can play an important role.  Because skin is not sterile, your skin needs to be as free of germs as possible.  You can reduce the number of germs on your skin by washing with CHG (chlorahexidine gluconate) soap before surgery.  CHG is an antiseptic cleaner which kills germs and bonds with the skin to continue killing germs even after washing. Please DO NOT use if you have an allergy to CHG or antibacterial soaps.  If your skin becomes reddened/irritated stop using the CHG and inform your nurse when you arrive at Short Stay. Do not shave (including legs and underarms) for at least 48 hours prior to the first CHG shower.  You may shave your  face/neck. Please follow these instructions carefully:  1.  Shower with CHG Soap the night before surgery and the  morning of Surgery.  2.  If you choose to wash your hair, wash your hair first as usual with your  normal  shampoo.  3.  After you shampoo, rinse your hair and body thoroughly to remove the  shampoo.                           4.  Use CHG as you would any other liquid soap.  You can apply chg directly  to the skin and wash                       Gently with a scrungie or clean washcloth.  5.  Apply the CHG Soap to your body ONLY FROM THE NECK DOWN.   Do not use on face/ open  Wound or open sores. Avoid contact with eyes, ears mouth and genitals (private parts).                       Wash face,  Genitals (private parts) with your normal soap.             6.  Wash thoroughly, paying special attention to the area where your surgery  will be performed.  7.  Thoroughly rinse your body with warm water from the neck down.  8.  DO NOT shower/wash with your normal soap after using and rinsing off  the CHG Soap.                9.  Pat yourself dry with a clean towel.            10.  Wear clean pajamas.            11.  Place clean sheets on your bed the night of your first shower and do not  sleep with pets. Day of Surgery : Do not apply any lotions/deodorants the morning of surgery.  Please wear clean clothes to the hospital/surgery center.  FAILURE TO FOLLOW THESE INSTRUCTIONS MAY RESULT IN THE CANCELLATION OF YOUR SURGERY PATIENT SIGNATURE_________________________________  NURSE SIGNATURE__________________________________  ________________________________________________________________________

## 2017-12-16 NOTE — Progress Notes (Signed)
10-27-17 (Epic) EKG  10-26-17 (Epic) CXR  01-09-16 (Epic) ECHO

## 2017-12-17 ENCOUNTER — Encounter (HOSPITAL_COMMUNITY): Payer: Self-pay

## 2017-12-17 ENCOUNTER — Other Ambulatory Visit: Payer: Self-pay

## 2017-12-17 ENCOUNTER — Encounter (HOSPITAL_COMMUNITY)
Admission: RE | Admit: 2017-12-17 | Discharge: 2017-12-17 | Disposition: A | Discharge status: Home / Self Care | Payer: Medicare Other | Source: Ambulatory Visit | Attending: Urology | Admitting: Urology

## 2017-12-17 ENCOUNTER — Other Ambulatory Visit: Payer: Self-pay | Admitting: Internal Medicine

## 2017-12-17 DIAGNOSIS — N201 Calculus of ureter: Secondary | ICD-10-CM | POA: Diagnosis not present

## 2017-12-17 DIAGNOSIS — Z01812 Encounter for preprocedural laboratory examination: Secondary | ICD-10-CM | POA: Diagnosis not present

## 2017-12-17 HISTORY — DX: Other complications of anesthesia, initial encounter: T88.59XA

## 2017-12-17 HISTORY — DX: Adverse effect of unspecified anesthetic, initial encounter: T41.45XA

## 2017-12-17 LAB — BASIC METABOLIC PANEL
Anion gap: 10 (ref 5–15)
BUN: 15 mg/dL (ref 6–20)
CO2: 28 mmol/L (ref 22–32)
CREATININE: 0.85 mg/dL (ref 0.44–1.00)
Calcium: 9.4 mg/dL (ref 8.9–10.3)
Chloride: 107 mmol/L (ref 101–111)
Glucose, Bld: 119 mg/dL — ABNORMAL HIGH (ref 65–99)
POTASSIUM: 3.9 mmol/L (ref 3.5–5.1)
Sodium: 145 mmol/L (ref 135–145)

## 2017-12-17 LAB — CBC
HCT: 36.4 % (ref 36.0–46.0)
Hemoglobin: 11.5 g/dL — ABNORMAL LOW (ref 12.0–15.0)
MCH: 30.8 pg (ref 26.0–34.0)
MCHC: 31.6 g/dL (ref 30.0–36.0)
MCV: 97.6 fL (ref 78.0–100.0)
Platelets: 287 10*3/uL (ref 150–400)
RBC: 3.73 MIL/uL — ABNORMAL LOW (ref 3.87–5.11)
RDW: 14.5 % (ref 11.5–15.5)
WBC: 7 10*3/uL (ref 4.0–10.5)

## 2017-12-24 ENCOUNTER — Encounter (HOSPITAL_COMMUNITY): Payer: Self-pay | Admitting: Emergency Medicine

## 2017-12-24 ENCOUNTER — Ambulatory Visit (HOSPITAL_COMMUNITY): Payer: Medicare Other

## 2017-12-24 ENCOUNTER — Ambulatory Visit (HOSPITAL_COMMUNITY): Payer: Medicare Other | Admitting: Anesthesiology

## 2017-12-24 ENCOUNTER — Ambulatory Visit (HOSPITAL_COMMUNITY)
Admission: RE | Admit: 2017-12-24 | Discharge: 2017-12-24 | Disposition: A | Discharge status: Home / Self Care | Payer: Medicare Other | Source: Ambulatory Visit | Attending: Urology | Admitting: Urology

## 2017-12-24 ENCOUNTER — Encounter (HOSPITAL_COMMUNITY)
Admission: RE | Disposition: A | Discharge status: Home / Self Care | Payer: Self-pay | Source: Ambulatory Visit | Attending: Urology

## 2017-12-24 DIAGNOSIS — N200 Calculus of kidney: Secondary | ICD-10-CM

## 2017-12-24 DIAGNOSIS — Z79899 Other long term (current) drug therapy: Secondary | ICD-10-CM | POA: Diagnosis not present

## 2017-12-24 DIAGNOSIS — I110 Hypertensive heart disease with heart failure: Secondary | ICD-10-CM | POA: Diagnosis not present

## 2017-12-24 DIAGNOSIS — E662 Morbid (severe) obesity with alveolar hypoventilation: Secondary | ICD-10-CM | POA: Insufficient documentation

## 2017-12-24 DIAGNOSIS — Z87440 Personal history of urinary (tract) infections: Secondary | ICD-10-CM | POA: Insufficient documentation

## 2017-12-24 DIAGNOSIS — N2889 Other specified disorders of kidney and ureter: Secondary | ICD-10-CM | POA: Diagnosis not present

## 2017-12-24 DIAGNOSIS — Z7901 Long term (current) use of anticoagulants: Secondary | ICD-10-CM | POA: Diagnosis not present

## 2017-12-24 DIAGNOSIS — Z79890 Hormone replacement therapy (postmenopausal): Secondary | ICD-10-CM | POA: Diagnosis not present

## 2017-12-24 DIAGNOSIS — Z466 Encounter for fitting and adjustment of urinary device: Secondary | ICD-10-CM | POA: Diagnosis not present

## 2017-12-24 DIAGNOSIS — E119 Type 2 diabetes mellitus without complications: Secondary | ICD-10-CM | POA: Diagnosis not present

## 2017-12-24 DIAGNOSIS — I509 Heart failure, unspecified: Secondary | ICD-10-CM | POA: Diagnosis not present

## 2017-12-24 DIAGNOSIS — C641 Malignant neoplasm of right kidney, except renal pelvis: Secondary | ICD-10-CM | POA: Insufficient documentation

## 2017-12-24 DIAGNOSIS — I4891 Unspecified atrial fibrillation: Secondary | ICD-10-CM | POA: Insufficient documentation

## 2017-12-24 DIAGNOSIS — Z6841 Body Mass Index (BMI) 40.0 and over, adult: Secondary | ICD-10-CM | POA: Insufficient documentation

## 2017-12-24 DIAGNOSIS — N201 Calculus of ureter: Secondary | ICD-10-CM | POA: Insufficient documentation

## 2017-12-24 DIAGNOSIS — E039 Hypothyroidism, unspecified: Secondary | ICD-10-CM | POA: Diagnosis not present

## 2017-12-24 DIAGNOSIS — C642 Malignant neoplasm of left kidney, except renal pelvis: Secondary | ICD-10-CM | POA: Diagnosis not present

## 2017-12-24 HISTORY — PX: CYSTOSCOPY/RETROGRADE/URETEROSCOPY: SHX5316

## 2017-12-24 LAB — GLUCOSE, CAPILLARY: GLUCOSE-CAPILLARY: 148 mg/dL — AB (ref 65–99)

## 2017-12-24 SURGERY — CYSTOSCOPY/RETROGRADE/URETEROSCOPY
Anesthesia: General | Laterality: Bilateral

## 2017-12-24 MED ORDER — LIDOCAINE 2% (20 MG/ML) 5 ML SYRINGE
INTRAMUSCULAR | Status: DC | PRN
  Administered 2017-12-24: 100 mg via INTRAVENOUS

## 2017-12-24 MED ORDER — FENTANYL CITRATE (PF) 100 MCG/2ML IJ SOLN
INTRAMUSCULAR | Status: AC
  Filled 2017-12-24: qty 2

## 2017-12-24 MED ORDER — ONDANSETRON HCL 4 MG/2ML IJ SOLN
INTRAMUSCULAR | Status: DC | PRN
  Administered 2017-12-24: 4 mg via INTRAVENOUS

## 2017-12-24 MED ORDER — FENTANYL CITRATE (PF) 100 MCG/2ML IJ SOLN
INTRAMUSCULAR | Status: DC | PRN
  Administered 2017-12-24: 50 ug via INTRAVENOUS

## 2017-12-24 MED ORDER — MEPERIDINE HCL 50 MG/ML IJ SOLN: 6.2500 mg | INTRAMUSCULAR | Status: DC | PRN

## 2017-12-24 MED ORDER — SODIUM CHLORIDE 0.9 % IR SOLN
Status: DC | PRN
  Administered 2017-12-24: 6000 mL

## 2017-12-24 MED ORDER — KETOROLAC TROMETHAMINE 15 MG/ML IJ SOLN
INTRAMUSCULAR | Status: AC
  Filled 2017-12-24: qty 1

## 2017-12-24 MED ORDER — PROPOFOL 10 MG/ML IV BOLUS
INTRAVENOUS | Status: AC
  Filled 2017-12-24: qty 20

## 2017-12-24 MED ORDER — CIPROFLOXACIN IN D5W 400 MG/200ML IV SOLN
400.0000 mg | INTRAVENOUS | Status: AC
  Administered 2017-12-24: 400 mg via INTRAVENOUS
  Filled 2017-12-24: qty 200

## 2017-12-24 MED ORDER — METOCLOPRAMIDE HCL 5 MG/ML IJ SOLN: 10.0000 mg | Freq: Once | INTRAMUSCULAR | Status: DC | PRN

## 2017-12-24 MED ORDER — LIDOCAINE 2% (20 MG/ML) 5 ML SYRINGE
INTRAMUSCULAR | Status: AC
  Filled 2017-12-24: qty 5

## 2017-12-24 MED ORDER — IOHEXOL 300 MG/ML  SOLN
INTRAMUSCULAR | Status: DC | PRN
  Administered 2017-12-24: 35 mL

## 2017-12-24 MED ORDER — LACTATED RINGERS IV SOLN
INTRAVENOUS | Status: DC | PRN
  Administered 2017-12-24: 07:00:00 via INTRAVENOUS

## 2017-12-24 MED ORDER — DEXAMETHASONE SODIUM PHOSPHATE 10 MG/ML IJ SOLN
INTRAMUSCULAR | Status: AC
  Filled 2017-12-24: qty 1

## 2017-12-24 MED ORDER — PROPOFOL 10 MG/ML IV BOLUS
INTRAVENOUS | Status: DC | PRN
  Administered 2017-12-24: 110 mg via INTRAVENOUS

## 2017-12-24 MED ORDER — APIXABAN 5 MG PO TABS: 5.0000 mg | ORAL_TABLET | Freq: Two times a day (BID) | ORAL | Status: DC

## 2017-12-24 MED ORDER — DEXAMETHASONE SODIUM PHOSPHATE 10 MG/ML IJ SOLN
INTRAMUSCULAR | Status: DC | PRN
  Administered 2017-12-24: 10 mg via INTRAVENOUS

## 2017-12-24 MED ORDER — FENTANYL CITRATE (PF) 100 MCG/2ML IJ SOLN: 25.0000 ug | INTRAMUSCULAR | Status: DC | PRN

## 2017-12-24 MED ORDER — ONDANSETRON HCL 4 MG/2ML IJ SOLN
INTRAMUSCULAR | Status: AC
  Filled 2017-12-24: qty 2

## 2017-12-24 MED ORDER — KETOROLAC TROMETHAMINE 15 MG/ML IJ SOLN
15.0000 mg | Freq: Once | INTRAMUSCULAR | Status: AC
  Administered 2017-12-24: 15 mg via INTRAVENOUS

## 2017-12-24 SURGICAL SUPPLY — 24 items
BAG URO CATCHER STRL LF (MISCELLANEOUS) ×4 IMPLANT
BASKET ZERO TIP NITINOL 2.4FR (BASKET) ×3 IMPLANT
BSKT STON RTRVL ZERO TP 2.4FR (BASKET) ×2
CATH URET 5FR 28IN OPEN ENDED (CATHETERS) ×4 IMPLANT
CLOTH BEACON ORANGE TIMEOUT ST (SAFETY) ×4 IMPLANT
COVER FOOTSWITCH UNIV (MISCELLANEOUS) IMPLANT
COVER SURGICAL LIGHT HANDLE (MISCELLANEOUS) ×4 IMPLANT
EXTRACTOR STONE 1.7FRX115CM (UROLOGICAL SUPPLIES) IMPLANT
EXTRACTOR STONE NITINOL NGAGE (UROLOGICAL SUPPLIES) ×3 IMPLANT
GLOVE BIOGEL M STRL SZ7.5 (GLOVE) ×4 IMPLANT
GOWN STRL REUS W/TWL XL LVL3 (GOWN DISPOSABLE) ×4 IMPLANT
GUIDEWIRE ANG ZIPWIRE 038X150 (WIRE) IMPLANT
GUIDEWIRE STR DUAL SENSOR (WIRE) ×4 IMPLANT
MANIFOLD NEPTUNE II (INSTRUMENTS) ×4 IMPLANT
PACK CYSTO (CUSTOM PROCEDURE TRAY) ×4 IMPLANT
SHEATH ACCESS URETERAL 24CM (SHEATH) IMPLANT
SHEATH ACCESS URETERAL 54CM (SHEATH) IMPLANT
SHEATH URETERAL 12FRX28CM (UROLOGICAL SUPPLIES) ×3 IMPLANT
SHEATH URETERAL 12FRX35CM (MISCELLANEOUS) IMPLANT
STENT URET 6FRX24 CONTOUR (STENTS) ×3 IMPLANT
TUBING CONNECTING 10 (TUBING) ×3 IMPLANT
TUBING CONNECTING 10' (TUBING) ×1
TUBING UROLOGY SET (TUBING) ×4 IMPLANT
WIRE COONS/BENSON .038X145CM (WIRE) IMPLANT

## 2017-12-24 NOTE — Transfer of Care (Signed)
Immediate Anesthesia Transfer of Care Note  Patient: [name] Case  Procedure(s) Performed: BILATERAL URETEROSCOPY, RIGHT STENT EXCHANGE, STONE EXTRACTION WITH BASKET BILATERAL RETROGRADE PYELOGRAM, BIOPSY OF RIGHT UPPER POLE TUMOR,  BILATERAL RENAL PELVIS FLUID SENT FOR CYTOLOGY (Bilateral )  Patient Location: PACU  Anesthesia Type:General  Level of Consciousness: awake  Airway & Oxygen Therapy: Patient Spontanous Breathing and Patient connected to face mask oxygen  Post-op Assessment: Report given to RN and Post -op Vital signs reviewed and stable  Post vital signs: Reviewed and stable  Last Vitals:  Vitals Value Taken Time  BP    Temp    Pulse 73 12/24/2017  8:51 AM  Resp 15 12/24/2017  8:51 AM  SpO2 92 % 12/24/2017  8:51 AM  Vitals shown include unvalidated device data.  Last Pain:  Vitals:   12/24/17 0601  TempSrc:   PainSc: 0-No pain      Patients Stated Pain Goal: 4 (37/04/88 8916)  Complications: No apparent anesthesia complications

## 2017-12-24 NOTE — Anesthesia Postprocedure Evaluation (Signed)
Anesthesia Post Note  Patient: [name] Case  Procedure(s) Performed: BILATERAL URETEROSCOPY, RIGHT STENT EXCHANGE, STONE EXTRACTION WITH BASKET BILATERAL RETROGRADE PYELOGRAM, BIOPSY OF RIGHT UPPER POLE TUMOR,  BILATERAL RENAL PELVIS FLUID SENT FOR CYTOLOGY (Bilateral )     Patient location during evaluation: PACU Anesthesia Type: General Level of consciousness: awake and alert Pain management: pain level controlled Vital Signs Assessment: post-procedure vital signs reviewed and stable Respiratory status: spontaneous breathing, nonlabored ventilation, respiratory function stable and patient connected to nasal cannula oxygen Cardiovascular status: blood pressure returned to baseline and stable Postop Assessment: no apparent nausea or vomiting Anesthetic complications: no    Last Vitals:  Vitals:   12/24/17 1015 12/24/17 1100  BP: 129/85 137/90  Pulse: 88 88  Resp: 14 16  Temp:  36.7 C  SpO2: 95% 95%    Last Pain:  Vitals:   12/24/17 1100  TempSrc: Oral  PainSc: 3                  Montez Hageman

## 2017-12-24 NOTE — Interval H&P Note (Signed)
History and Physical Interval Note: Patient returns for stone removal.  She has been well since discharge from the hospital two weeks prior.  12/24/2017 6:44 AM  [name] Case  has presented today for surgery, with the diagnosis of RIGHT URETERAL STONE  The various methods of treatment have been discussed with the patient and family. After consideration of risks, benefits and other options for treatment, the patient has consented to  Procedure(s): RIGHT URETEROSCOPY WITH HOLMIUM LASER LITHOTRIPSY AND STENT EXCHANGE (Right) as a surgical intervention .  The patient's history has been reviewed, patient examined, no change in status, stable for surgery.  I have reviewed the patient's chart and labs.  Questions were answered to the patient's satisfaction.     Louis Meckel W

## 2017-12-24 NOTE — Anesthesia Preprocedure Evaluation (Addendum)
Anesthesia Evaluation  Patient identified by MRN, date of birth, ID band Patient awake    Reviewed: Allergy & Precautions, NPO status , Patient's Chart, lab work & pertinent test results  Airway Mallampati: II  TM Distance: >3 FB Neck ROM: Full    Dental no notable dental hx. (+) Upper Dentures, Lower Dentures   Pulmonary neg pulmonary ROS,    Pulmonary exam normal breath sounds clear to auscultation       Cardiovascular hypertension, Pt. on medications and Pt. on home beta blockers +CHF  Normal cardiovascular exam+ dysrhythmias Atrial Fibrillation  Rhythm:Regular Rate:Normal     Neuro/Psych negative neurological ROS  negative psych ROS   GI/Hepatic Neg liver ROS, GERD  Controlled,  Endo/Other  diabetesHypothyroidism   Renal/GU negative Renal ROS  negative genitourinary   Musculoskeletal negative musculoskeletal ROS (+)   Abdominal   Peds negative pediatric ROS (+)  Hematology negative hematology ROS (+)   Anesthesia Other Findings   Reproductive/Obstetrics negative OB ROS                             Anesthesia Physical Anesthesia Plan  ASA: III  Anesthesia Plan: General   Post-op Pain Management:    Induction: Intravenous  PONV Risk Score and Plan: 3 and Ondansetron and Treatment may vary due to age or medical condition  Airway Management Planned: LMA  Additional Equipment:   Intra-op Plan:   Post-operative Plan:   Informed Consent: I have reviewed the patients History and Physical, chart, labs and discussed the procedure including the risks, benefits and alternatives for the proposed anesthesia with the patient or authorized representative who has indicated his/her understanding and acceptance.   Dental advisory given  Plan Discussed with: CRNA  Anesthesia Plan Comments:         Anesthesia Quick Evaluation

## 2017-12-24 NOTE — Op Note (Signed)
Preoperative diagnosis:  1. Right obstructing ureteral stone  Postoperative diagnosis:  1. As above 2. Right upper pole urothelial mass 3. Filling defect in left renal pelvis  Procedure: 1. Cystoscopy, bilateral retrograde pyelogram with interpretation 2. Right ureteroscopy, stone extraction (basket) 3. Right upper pole urothelial mass biopsy 4. Left diagnostic ureteroscopy 5. Right ureteral stent exchange  Surgeon: Ardis Hughs, MD  Anesthesia: General  Complications: None  Intraoperative findings:  #1: The right retrograde pyelogram demonstrated very subtle filling defect in the mid right ureter consistent with the patient's known stone.  The patient had an abnormal retrograde pyelogram demonstrating a dilated renal pelvis and no filling of the upper pole. #2: The patient's left retrograde pyelogram demonstrated normal caliber ureter with no hydronephrosis or filling defects.  There was a filling defect in the medial aspect of the upper portion of the renal pelvis.  There is no hydronephrosis. #3: The patient's right mid ureteral stone was removed with stone basket without any further manipulation required. #4: In the right upper pole there were at least 4 papillary lesions consistent with urothelial carcinoma. #5: The patient's left ureter was on accommodating in the distal aspect and I was unable to get beyond this portion up into the patient's left kidney.  I did obtain a urine from the left renal pelvis that I sent for cytology.  EBL: Minimal  Specimens:  #1: The patient's stone was taken back to [facility] for further evaluation/stone composition analysis. #2: Right renal pelvis cytology #3: Right upper pole urothelial tumor biopsy #4: Left renal pelvis cytology  Indication: [name] Case is a 84 y.o. patient Who presented several weeks ago with renal colic. she was noted to have a 2 mm stone in the right mid ureter.  She failed medical expulsion therapy  secondary to pain.  A stent was placed and she followed up today for ongoing management.   After reviewing the management options for treatment, he elected to proceed with the above surgical procedure(s). We have discussed the potential benefits and risks of the procedure, side effects of the proposed treatment, the likelihood of the patient achieving the goals of the procedure, and any potential problems that might occur during the procedure or recuperation. Informed consent has been obtained.  Description of procedure:  The patient was taken to the operating room and general anesthesia was induced.  The patient was placed in the dorsal lithotomy position, prepped and draped in the usual sterile fashion, and preoperative antibiotics were administered. A preoperative time-out was performed.   21 French 30 degrees cystoscope was then gently passed through the patient's urethra into the bladder under visual guidance.  The stent emanating from the right ureteral orifice was grasped and brought to the urethral meatus.  A wire was then advanced through the stent up into the right renal pelvis removing the stent over the wire.  I then advanced the open-ended ureteral catheter over the wire and removing the wire performing a right retrograde pyelogram with the above findings.  A semirigid ureteroscope was then used to cannulate the right ureteral orifice and was easily advanced up to the mid portion of the ureter where the stones were encountered.  A basket was then used to remove these stones and they were put into the bladder.  I then advanced a flexible digital ureteroscope over the wire and into the right renal pelvis.  Pyeloscopy then demonstrated an area in the upper pole that was consistent with an incidental finding of urothelial  carcinoma.  At this point I opted to advance a short ureteral access sheath into the right ureter and performed a biopsy using the 0 tipped basket.  Prior to doing the biopsy I did  send a urine cytology from this area as well.  I then performed pyeloscopy throughout the remainder of the kidney and there was no additional filling defects or abnormalities.  I then slowly backed out the scope ensuring that there was no trauma from the access sheath.  I then advanced a 6 [place] x24 cm double-J ureteral stent over the wire in place it in the right upper pole under fluoroscopy.  Once the stent was noted to be well positioned in the upper pole I advanced the wire to the urethral meatus pulling the wire out entirely.  I then reintroduced the cystoscope and performed a left retrograde pyelogram with the above findings.  I noted a filling defect in the as described above in the renal pelvis and attempted ureteroscopy at this time.  I was unable to advance the flexible ureteroscope over the wire beyond the distal portion of the ureter.  At this point I opted to remove the scope and exchanged a wire for an open-ended catheter.  I performed a barbotag within the left renal pelvis and obtained ae specimen that I sent for the left renal pelvic wash cytology.  The patient's bladder was subsequently emptied, she was awoken and returned to the PACU in good condition.  Ardis Hughs, M.D.

## 2017-12-24 NOTE — Discharge Instructions (Signed)
DISCHARGE INSTRUCTIONS FOR KIDNEY STONE/URETERAL STENT   MEDICATIONS:  1.  Resume all your other meds from home - except do not take any extra narcotic pain meds that you may have at home.  2. Tramadol is for moderate/severe pain, otherwise taking upto 1000 mg every 6 hours of plainTylenol will help treat your pain.    ACTIVITY:  1. No strenuous activity x 1week  2. No driving while on narcotic pain medications  3. Drink plenty of water  4. Continue to walk at home - you can still get blood clots when you are at home, so keep active, but don't over do it.  5. May return to work/school tomorrow or when you feel ready   BATHING:  1. You can shower and we recommend daily showers   SIGNS/SYMPTOMS TO CALL:  Please call [name] Case if you have a fever greater than 101.5, uncontrolled nausea/vomiting, uncontrolled pain, dizziness, unable to urinate, bloody urine, chest pain, shortness of breath, leg swelling, leg pain, redness around wound, drainage from wound, or any other concerns or questions.   You can reach [name] Case at [phone].   FOLLOW-UP:  1. You have an appointment in 2 weeks to review surgery and discuss further management.    Lithotripsy, Care After This sheet gives you information about how to care for yourself after your procedure. Your health care provider may also give you more specific instructions. If you have problems or questions, contact your health care provider. What can I expect after the procedure? After the procedure, it is common to have:  Some blood in your urine. This should only last for a few days.  Soreness in your back, sides, or upper abdomen for a few days.  Blotches or bruises on your back where the pressure wave entered the skin.  Pain, discomfort, or nausea when pieces (fragments) of the kidney stone move through the tube that carries urine from the kidney to the bladder (ureter). Stone fragments may pass soon after the procedure, but they may continue to pass  for up to 4-8 weeks. ? If you have severe pain or nausea, contact your health care provider. This may be caused by a large stone that was not broken up, and this may mean that you need more treatment.  Some pain or discomfort during urination.  Some pain or discomfort in the lower abdomen or (in men) at the base of the penis.  Follow these instructions at home: Medicines  Take over-the-counter and prescription medicines only as told by your health care provider.  If you were prescribed an antibiotic medicine, take it as told by your health care provider. Do not stop taking the antibiotic even if you start to feel better.  Do not drive for 24 hours if you were given a medicine to help you relax (sedative).  Do not drive or use heavy machinery while taking prescription pain medicine. Eating and drinking  Drink enough water and fluids to keep your urine clear or pale yellow. This helps any remaining pieces of the stone to pass. It can also help prevent new stones from forming.  Eat plenty of fresh fruits and vegetables.  Follow instructions from your health care provider about eating and drinking restrictions. You may be instructed: ? To reduce how much salt (sodium) you eat or drink. Check ingredients and nutrition facts on packaged foods and beverages. ? To reduce how much meat you eat.  Eat the recommended amount of calcium for your age and gender. Ask your  health care provider how much calcium you should have. General instructions  Get plenty of rest.  Most people can resume normal activities 1-2 days after the procedure. Ask your health care provider what activities are safe for you.  If directed, strain all urine through the strainer that was provided by your health care provider. ? Keep all fragments for your health care provider to see. Any stones that are found may be sent to a medical lab for examination. The stone may be as small as a grain of salt.  Keep all follow-up  visits as told by your health care provider. This is important. Contact a health care provider if:  You have pain that is severe or does not get better with medicine.  You have nausea that is severe or does not go away.  You have blood in your urine longer than your health care provider told you to expect.  You have more blood in your urine.  You have pain during urination that does not go away.  You urinate more frequently than usual and this does not go away.  You develop a rash or any other possible signs of an allergic reaction. Get help right away if:  You have severe pain in your back, sides, or upper abdomen.  You have severe pain while urinating.  Your urine is very dark red.  You have blood in your stool (feces).  You cannot pass any urine at all.  You feel a strong urge to urinate after emptying your bladder.  You have a fever or chills.  You develop shortness of breath, difficulty breathing, or chest pain.  You have severe nausea that leads to persistent vomiting.  You faint. Summary  After this procedure, it is common to have some pain, discomfort, or nausea when pieces (fragments) of the kidney stone move through the tube that carries urine from the kidney to the bladder (ureter). If this pain or nausea is severe, however, you should contact your health care provider.  Most people can resume normal activities 1-2 days after the procedure. Ask your health care provider what activities are safe for you.  Drink enough water and fluids to keep your urine clear or pale yellow. This helps any remaining pieces of the stone to pass, and it can help prevent new stones from forming.  If directed, strain your urine and keep all fragments for your health care provider to see. Fragments or stones may be as small as a grain of salt.  Get help right away if you have severe pain in your back, sides, or upper abdomen or have severe pain while urinating. This information is  not intended to replace advice given to you by your health care provider. Make sure you discuss any questions you have with your health care provider. Document Released: 09/07/2007 Document Revised: 07/09/2016 Document Reviewed: 07/09/2016 Elsevier Interactive Patient Education  2018 [place].    Ureteral Stent Implantation, Care After Refer to this sheet in the next few weeks. These instructions provide you with information about caring for yourself after your procedure. Your health care provider may also give you more specific instructions. Your treatment has been planned according to current medical practices, but problems sometimes occur. Call your health care provider if you have any problems or questions after your procedure. What can I expect after the procedure? After the procedure, it is common to have:  Nausea.  Mild pain when you urinate. You may feel this pain in your lower  back or lower abdomen. Pain should stop within a few minutes after you urinate. This may last for up to 1 week.  A small amount of blood in your urine for several days.  Follow these instructions at home:  Medicines  Take over-the-counter and prescription medicines only as told by your health care provider.  If you were prescribed an antibiotic medicine, take it as told by your health care provider. Do not stop taking the antibiotic even if you start to feel better.  Do not drive for 24 hours if you received a sedative.  Do not drive or operate heavy machinery while taking prescription pain medicines. Activity  Return to your normal activities as told by your health care provider. Ask your health care provider what activities are safe for you.  Do not lift anything that is heavier than 10 lb (4.5 kg). Follow this limit for 1 week after your procedure, or for as long as told by your health care provider. General instructions  Watch for any blood in your urine. Call your health care provider if  the amount of blood in your urine increases.  If you have a catheter: ? Follow instructions from your health care provider about taking care of your catheter and collection bag. ? Do not take baths, swim, or use a hot tub until your health care provider approves.  Drink enough fluid to keep your urine clear or pale yellow.  Keep all follow-up visits as told by your health care provider. This is important. Contact a health care provider if:  You have pain that gets worse or does not get better with medicine, especially pain when you urinate.  You have difficulty urinating.  You feel nauseous or you vomit repeatedly during a period of more than 2 days after the procedure. Get help right away if:  Your urine is dark red or has blood clots in it.  You are leaking urine (have incontinence).  The end of the stent comes out of your urethra.  You cannot urinate.  You have sudden, sharp, or severe pain in your abdomen or lower back.  You have a fever. This information is not intended to replace advice given to you by your health care provider. Make sure you discuss any questions you have with your health care provider. Document Released: 04/20/2013 Document Revised: 01/24/2016 Document Reviewed: 03/02/2015 Elsevier Interactive Patient Education  [employer].

## 2017-12-24 NOTE — Anesthesia Procedure Notes (Signed)
Date/Time: 12/24/2017 7:39 AM Performed by: Sharlette Dense, CRNA Patient Re-evaluated:Patient Re-evaluated prior to induction Oxygen Delivery Method: Circle system utilized Preoxygenation: Pre-oxygenation with 100% oxygen Induction Type: IV induction Ventilation: Mask ventilation without difficulty LMA: LMA inserted LMA Size: 4.0 Number of attempts: 1 Placement Confirmation: positive ETCO2 and breath sounds checked- equal and bilateral Tube secured with: Tape Dental Injury: Teeth and Oropharynx as per pre-operative assessment

## 2018-01-07 DIAGNOSIS — N201 Calculus of ureter: Secondary | ICD-10-CM | POA: Diagnosis not present

## 2018-01-07 DIAGNOSIS — K5730 Diverticulosis of large intestine without perforation or abscess without bleeding: Secondary | ICD-10-CM | POA: Diagnosis not present

## 2018-01-11 DIAGNOSIS — C651 Malignant neoplasm of right renal pelvis: Secondary | ICD-10-CM | POA: Diagnosis not present

## 2018-01-11 DIAGNOSIS — C652 Malignant neoplasm of left renal pelvis: Secondary | ICD-10-CM | POA: Diagnosis not present

## 2018-01-12 ENCOUNTER — Other Ambulatory Visit: Payer: Self-pay | Admitting: Urology

## 2018-01-12 ENCOUNTER — Telehealth: Payer: Self-pay | Admitting: Internal Medicine

## 2018-01-12 NOTE — Telephone Encounter (Signed)
Fax received and placed in MD box to sign

## 2018-01-12 NOTE — Telephone Encounter (Signed)
Copied from [place] #[id number]. Topic: General - Other >> Jan 12, 2018  9:13 AM Margot Ables wrote: Reason for CRM: Pam is calling for surgical clearance for a tumor resection in ureter. Pt surgery in 2 weeks. She will fax clearance form & OV notes to Dr. Sharlet Salina today at [phone].  Pt had a surgery in April also (a stint was place & pt had a stone). Please f/u as needed.

## 2018-01-13 NOTE — Telephone Encounter (Signed)
Forms received and clearance form faxed to [facility] urology

## 2018-01-18 ENCOUNTER — Encounter (HOSPITAL_COMMUNITY): Payer: Self-pay

## 2018-01-18 NOTE — Patient Instructions (Signed)
Your procedure is scheduled on: Friday, Jan 29, 2018   Surgery Time:  7:30AM-9:15AM   Report to [place]  Entrance    Report to admitting at 5:30 AM   Call this number if you have problems the morning of surgery [phone]   Do not eat food or drink liquids :After Midnight.   Do NOT smoke after Midnight   Take these medicines the morning of surgery with A SIP OF WATER:  Diltiazem, Levothyroxine, Metoprolol, Ranitidine                               You may not have any metal on your body including hair pins, jewelry, and body piercings             Do not wear make-up, lotions, powders, perfumes/cologne, or deodorant             Do not wear nail polish.  Do not shave  48 hours prior to surgery.              Do not bring valuables to the hospital. [place].   Contacts, dentures or bridgework may not be worn into surgery.    Patients discharged the day of surgery will not be allowed to drive home.   Special Instructions: Bring a copy of your healthcare power of attorney and living will documents         the day of surgery if you haven't scanned them in before.              Please read over the following fact sheets you were given:  [facility] - Preparing for Surgery Before surgery, you can play an important role.  Because skin is not sterile, your skin needs to be as free of germs as possible.  You can reduce the number of germs on your skin by washing with CHG (chlorahexidine gluconate) soap before surgery.  CHG is an antiseptic cleaner which kills germs and bonds with the skin to continue killing germs even after washing. Please DO NOT use if you have an allergy to CHG or antibacterial soaps.  If your skin becomes reddened/irritated stop using the CHG and inform your nurse when you arrive at Short Stay. Do not shave (including legs and underarms) for at least 48 hours prior to the first CHG shower.  You may  shave your face/neck.  Please follow these instructions carefully:  1.  Shower with CHG Soap the night before surgery and the  morning of surgery.  2.  If you choose to wash your hair, wash your hair first as usual with your normal  shampoo.  3.  After you shampoo, rinse your hair and body thoroughly to remove the shampoo.                             4.  Use CHG as you would any other liquid soap.  You can apply chg directly to the skin and wash.  Gently with a scrungie or clean washcloth.  5.  Apply the CHG Soap to your body ONLY FROM THE NECK DOWN.   Do   not use on face/ open  Wound or open sores. Avoid contact with eyes, ears mouth and   genitals (private parts).                       Wash face,  Genitals (private parts) with your normal soap.             6.  Wash thoroughly, paying special attention to the area where your    surgery  will be performed.  7.  Thoroughly rinse your body with warm water from the neck down.  8.  DO NOT shower/wash with your normal soap after using and rinsing off the CHG Soap.                9.  Pat yourself dry with a clean towel.            10.  Wear clean pajamas.            11.  Place clean sheets on your bed the night of your first shower and do not  sleep with pets. Day of Surgery : Do not apply any lotions/deodorants the morning of surgery.  Please wear clean clothes to the hospital/surgery center.  FAILURE TO FOLLOW THESE INSTRUCTIONS MAY RESULT IN THE CANCELLATION OF YOUR SURGERY  PATIENT SIGNATURE_________________________________  NURSE SIGNATURE__________________________________  ________________________________________________________________________

## 2018-01-18 NOTE — Pre-Procedure Instructions (Signed)
EKG 10/27/2017 in epic Last office visit note 12/08/2017 Dr. Lesly Rubenstein

## 2018-01-19 ENCOUNTER — Other Ambulatory Visit: Payer: Self-pay

## 2018-01-19 ENCOUNTER — Encounter (HOSPITAL_COMMUNITY): Payer: Self-pay

## 2018-01-19 ENCOUNTER — Encounter (HOSPITAL_COMMUNITY)
Admission: RE | Admit: 2018-01-19 | Discharge: 2018-01-19 | Disposition: A | Discharge status: Home / Self Care | Payer: Medicare Other | Source: Ambulatory Visit | Attending: Urology | Admitting: Urology

## 2018-01-19 DIAGNOSIS — Z01812 Encounter for preprocedural laboratory examination: Secondary | ICD-10-CM | POA: Insufficient documentation

## 2018-01-19 HISTORY — DX: Unspecified osteoarthritis, unspecified site: M19.90

## 2018-01-19 HISTORY — DX: Personal history of other diseases of the digestive system: Z87.19

## 2018-01-19 HISTORY — DX: Cardiomegaly: I51.7

## 2018-01-19 HISTORY — DX: Urinary tract infection, site not specified: N39.0

## 2018-01-19 HISTORY — DX: Malignant (primary) neoplasm, unspecified: C80.1

## 2018-01-19 HISTORY — DX: Fatty (change of) liver, not elsewhere classified: K76.0

## 2018-01-19 HISTORY — DX: Ventral hernia without obstruction or gangrene: K43.9

## 2018-01-19 HISTORY — DX: Chronic obstructive pulmonary disease, unspecified: J44.9

## 2018-01-19 HISTORY — DX: Hypothyroidism, unspecified: E03.9

## 2018-01-19 HISTORY — DX: Diverticulosis of large intestine without perforation or abscess without bleeding: K57.30

## 2018-01-19 HISTORY — DX: Personal history of urinary calculi: Z87.442

## 2018-01-19 LAB — BASIC METABOLIC PANEL
ANION GAP: 11 (ref 5–15)
BUN: 14 mg/dL (ref 6–20)
CHLORIDE: 102 mmol/L (ref 101–111)
CO2: 30 mmol/L (ref 22–32)
Calcium: 8.9 mg/dL (ref 8.9–10.3)
Creatinine, Ser: 0.83 mg/dL (ref 0.44–1.00)
Glucose, Bld: 150 mg/dL — ABNORMAL HIGH (ref 65–99)
POTASSIUM: 3.4 mmol/L — AB (ref 3.5–5.1)
SODIUM: 143 mmol/L (ref 135–145)

## 2018-01-19 LAB — HEMOGLOBIN A1C
HEMOGLOBIN A1C: 6.3 % — AB (ref 4.8–5.6)
MEAN PLASMA GLUCOSE: 134.11 mg/dL

## 2018-01-19 LAB — CBC
HCT: 40.6 % (ref 36.0–46.0)
Hemoglobin: 12.9 g/dL (ref 12.0–15.0)
MCH: 30.6 pg (ref 26.0–34.0)
MCHC: 31.8 g/dL (ref 30.0–36.0)
MCV: 96.4 fL (ref 78.0–100.0)
PLATELETS: 271 10*3/uL (ref 150–400)
RBC: 4.21 MIL/uL (ref 3.87–5.11)
RDW: 13.8 % (ref 11.5–15.5)
WBC: 5.9 10*3/uL (ref 4.0–10.5)

## 2018-01-19 NOTE — Pre-Procedure Instructions (Signed)
BMP and HGB A 1C 01/19/2018 results faxed to Dr. Louis Meckel via epic

## 2018-01-19 NOTE — Pre-Procedure Instructions (Signed)
Surgical clearance from Dr. Sharlet Salina 01/12/2018 in chart

## 2018-01-23 ENCOUNTER — Other Ambulatory Visit: Payer: Self-pay | Admitting: Internal Medicine

## 2018-01-29 ENCOUNTER — Ambulatory Visit (HOSPITAL_COMMUNITY): Payer: Medicare Other | Admitting: Anesthesiology

## 2018-01-29 ENCOUNTER — Ambulatory Visit (HOSPITAL_COMMUNITY)
Admission: RE | Admit: 2018-01-29 | Discharge: 2018-01-29 | Disposition: A | Discharge status: Home / Self Care | Payer: Medicare Other | Source: Ambulatory Visit | Attending: Urology | Admitting: Urology

## 2018-01-29 ENCOUNTER — Encounter (HOSPITAL_COMMUNITY)
Admission: RE | Disposition: A | Discharge status: Home / Self Care | Payer: Self-pay | Source: Ambulatory Visit | Attending: Urology

## 2018-01-29 ENCOUNTER — Encounter (HOSPITAL_COMMUNITY): Payer: Self-pay

## 2018-01-29 ENCOUNTER — Ambulatory Visit (HOSPITAL_COMMUNITY): Payer: Medicare Other

## 2018-01-29 DIAGNOSIS — E785 Hyperlipidemia, unspecified: Secondary | ICD-10-CM | POA: Diagnosis not present

## 2018-01-29 DIAGNOSIS — C652 Malignant neoplasm of left renal pelvis: Secondary | ICD-10-CM | POA: Diagnosis not present

## 2018-01-29 DIAGNOSIS — C669 Malignant neoplasm of unspecified ureter: Secondary | ICD-10-CM

## 2018-01-29 DIAGNOSIS — E039 Hypothyroidism, unspecified: Secondary | ICD-10-CM | POA: Insufficient documentation

## 2018-01-29 DIAGNOSIS — Z91041 Radiographic dye allergy status: Secondary | ICD-10-CM | POA: Insufficient documentation

## 2018-01-29 DIAGNOSIS — Z79890 Hormone replacement therapy (postmenopausal): Secondary | ICD-10-CM | POA: Diagnosis not present

## 2018-01-29 DIAGNOSIS — K219 Gastro-esophageal reflux disease without esophagitis: Secondary | ICD-10-CM | POA: Diagnosis not present

## 2018-01-29 DIAGNOSIS — Z79899 Other long term (current) drug therapy: Secondary | ICD-10-CM | POA: Insufficient documentation

## 2018-01-29 DIAGNOSIS — Z885 Allergy status to narcotic agent status: Secondary | ICD-10-CM | POA: Diagnosis not present

## 2018-01-29 DIAGNOSIS — J449 Chronic obstructive pulmonary disease, unspecified: Secondary | ICD-10-CM | POA: Insufficient documentation

## 2018-01-29 DIAGNOSIS — D49512 Neoplasm of unspecified behavior of left kidney: Secondary | ICD-10-CM | POA: Diagnosis not present

## 2018-01-29 DIAGNOSIS — C651 Malignant neoplasm of right renal pelvis: Secondary | ICD-10-CM | POA: Insufficient documentation

## 2018-01-29 DIAGNOSIS — D49511 Neoplasm of unspecified behavior of right kidney: Secondary | ICD-10-CM | POA: Diagnosis not present

## 2018-01-29 DIAGNOSIS — I110 Hypertensive heart disease with heart failure: Secondary | ICD-10-CM | POA: Insufficient documentation

## 2018-01-29 DIAGNOSIS — Z7984 Long term (current) use of oral hypoglycemic drugs: Secondary | ICD-10-CM | POA: Insufficient documentation

## 2018-01-29 DIAGNOSIS — Z7951 Long term (current) use of inhaled steroids: Secondary | ICD-10-CM | POA: Diagnosis not present

## 2018-01-29 DIAGNOSIS — I509 Heart failure, unspecified: Secondary | ICD-10-CM | POA: Insufficient documentation

## 2018-01-29 DIAGNOSIS — E119 Type 2 diabetes mellitus without complications: Secondary | ICD-10-CM | POA: Diagnosis not present

## 2018-01-29 DIAGNOSIS — D4112 Neoplasm of uncertain behavior of left renal pelvis: Secondary | ICD-10-CM | POA: Diagnosis not present

## 2018-01-29 DIAGNOSIS — D4111 Neoplasm of uncertain behavior of right renal pelvis: Secondary | ICD-10-CM | POA: Diagnosis not present

## 2018-01-29 HISTORY — PX: CYSTOSCOPY WITH URETEROSCOPY AND STENT PLACEMENT: SHX6377

## 2018-01-29 LAB — GLUCOSE, CAPILLARY
Glucose-Capillary: 126 mg/dL — ABNORMAL HIGH (ref 65–99)
Glucose-Capillary: 138 mg/dL — ABNORMAL HIGH (ref 65–99)

## 2018-01-29 SURGERY — CYSTOURETEROSCOPY, WITH STENT INSERTION
Anesthesia: General | Laterality: Bilateral

## 2018-01-29 MED ORDER — OXYCODONE HCL 5 MG PO TABS
ORAL_TABLET | ORAL | Status: AC
  Filled 2018-01-29: qty 1

## 2018-01-29 MED ORDER — DEXAMETHASONE SODIUM PHOSPHATE 4 MG/ML IJ SOLN
INTRAMUSCULAR | Status: DC | PRN
  Administered 2018-01-29: 10 mg via INTRAVENOUS

## 2018-01-29 MED ORDER — OXYCODONE HCL 5 MG PO TABS
5.0000 mg | ORAL_TABLET | Freq: Once | ORAL | Status: AC
  Administered 2018-01-29: 5 mg via ORAL

## 2018-01-29 MED ORDER — DEXAMETHASONE SODIUM PHOSPHATE 10 MG/ML IJ SOLN
INTRAMUSCULAR | Status: AC
  Filled 2018-01-29: qty 1

## 2018-01-29 MED ORDER — FENTANYL CITRATE (PF) 100 MCG/2ML IJ SOLN
INTRAMUSCULAR | Status: AC
  Filled 2018-01-29: qty 2

## 2018-01-29 MED ORDER — LIDOCAINE HCL (CARDIAC) PF 100 MG/5ML IV SOSY
PREFILLED_SYRINGE | INTRAVENOUS | Status: DC | PRN
  Administered 2018-01-29 (×2): 50 mg via INTRAVENOUS

## 2018-01-29 MED ORDER — PROPOFOL 10 MG/ML IV BOLUS
INTRAVENOUS | Status: DC | PRN
  Administered 2018-01-29: 110 mg via INTRAVENOUS

## 2018-01-29 MED ORDER — IOHEXOL 300 MG/ML  SOLN
INTRAMUSCULAR | Status: DC | PRN
  Administered 2018-01-29: 20 mL via URETHRAL

## 2018-01-29 MED ORDER — FENTANYL CITRATE (PF) 100 MCG/2ML IJ SOLN
INTRAMUSCULAR | Status: DC | PRN
  Administered 2018-01-29: 25 ug via INTRAVENOUS
  Administered 2018-01-29: 50 ug via INTRAVENOUS
  Administered 2018-01-29: 25 ug via INTRAVENOUS

## 2018-01-29 MED ORDER — LIDOCAINE 2% (20 MG/ML) 5 ML SYRINGE
INTRAMUSCULAR | Status: AC
  Filled 2018-01-29: qty 5

## 2018-01-29 MED ORDER — OXYCODONE-ACETAMINOPHEN 5-325 MG PO TABS: 1.0000 | ORAL_TABLET | Freq: Once | ORAL | Status: DC

## 2018-01-29 MED ORDER — LACTATED RINGERS IV SOLN
INTRAVENOUS | Status: DC | PRN
  Administered 2018-01-29: 07:00:00 via INTRAVENOUS

## 2018-01-29 MED ORDER — PROPOFOL 10 MG/ML IV BOLUS
INTRAVENOUS | Status: AC
  Filled 2018-01-29: qty 40

## 2018-01-29 MED ORDER — CIPROFLOXACIN IN D5W 400 MG/200ML IV SOLN: 400.0000 mg | INTRAVENOUS | Status: DC

## 2018-01-29 MED ORDER — ONDANSETRON HCL 4 MG/2ML IJ SOLN
INTRAMUSCULAR | Status: DC | PRN
  Administered 2018-01-29: 4 mg via INTRAVENOUS

## 2018-01-29 MED ORDER — FENTANYL CITRATE (PF) 100 MCG/2ML IJ SOLN
25.0000 ug | INTRAMUSCULAR | Status: DC | PRN
  Administered 2018-01-29: 25 ug via INTRAVENOUS

## 2018-01-29 MED ORDER — ONDANSETRON HCL 4 MG/2ML IJ SOLN
INTRAMUSCULAR | Status: AC
  Filled 2018-01-29: qty 2

## 2018-01-29 MED ORDER — PROMETHAZINE HCL 25 MG/ML IJ SOLN: 6.2500 mg | INTRAMUSCULAR | Status: DC | PRN

## 2018-01-29 MED ORDER — CIPROFLOXACIN IN D5W 400 MG/200ML IV SOLN
400.0000 mg | INTRAVENOUS | Status: AC
  Administered 2018-01-29: 400 mg via INTRAVENOUS
  Filled 2018-01-29: qty 200

## 2018-01-29 MED ORDER — SODIUM CHLORIDE 0.9 % IR SOLN
Status: DC | PRN
  Administered 2018-01-29: 6000 mL

## 2018-01-29 SURGICAL SUPPLY — 24 items
BAG URO CATCHER STRL LF (MISCELLANEOUS) ×3 IMPLANT
BASKET ZERO TIP NITINOL 2.4FR (BASKET) IMPLANT
BSKT STON RTRVL ZERO TP 2.4FR (BASKET)
CATH URET 5FR 28IN OPEN ENDED (CATHETERS) ×3 IMPLANT
CATH URET DUAL LUMEN 6-10FR 50 (CATHETERS) ×2 IMPLANT
CLOTH BEACON ORANGE TIMEOUT ST (SAFETY) ×3 IMPLANT
COVER FOOTSWITCH UNIV (MISCELLANEOUS) IMPLANT
COVER SURGICAL LIGHT HANDLE (MISCELLANEOUS) ×3 IMPLANT
EXTRACTOR STONE 1.7FRX115CM (UROLOGICAL SUPPLIES) IMPLANT
FIBER LASER TRAC TIP (UROLOGICAL SUPPLIES) ×2 IMPLANT
GLOVE BIOGEL M STRL SZ7.5 (GLOVE) ×3 IMPLANT
GOWN STRL REUS W/TWL XL LVL3 (GOWN DISPOSABLE) ×3 IMPLANT
GUIDEWIRE ANG ZIPWIRE 038X150 (WIRE) IMPLANT
GUIDEWIRE STR DUAL SENSOR (WIRE) ×5 IMPLANT
MANIFOLD NEPTUNE II (INSTRUMENTS) ×3 IMPLANT
PACK CYSTO (CUSTOM PROCEDURE TRAY) ×3 IMPLANT
SHEATH URETERAL 12FRX28CM (UROLOGICAL SUPPLIES) IMPLANT
SHEATH URETERAL 12FRX35CM (MISCELLANEOUS) ×2 IMPLANT
STENT CONTOUR 6FRX24X.038 (STENTS) ×2 IMPLANT
STENT URET 6FRX24 CONTOUR (STENTS) ×4 IMPLANT
TUBING CONNECTING 10 (TUBING) ×2 IMPLANT
TUBING CONNECTING 10' (TUBING) ×1
TUBING UROLOGY SET (TUBING) ×3 IMPLANT
WIRE COONS/BENSON .038X145CM (WIRE) IMPLANT

## 2018-01-29 NOTE — Transfer of Care (Signed)
Immediate Anesthesia Transfer of Care Note  Patient: TRUDA STAUB  Procedure(s) Performed: Procedure(s): BILATERAL URETEROSCOPY AND TUMOR RESECTION  WITH LASER RIGHT STENT EXCHANGE LEFT STENT PLACEMENT (Bilateral)  Patient Location: PACU  Anesthesia Type:General  Level of Consciousness: Patient easily awoken, sedated, comfortable, cooperative, following commands, responds to stimulation.   Airway & Oxygen Therapy: Patient spontaneously breathing, ventilating well, oxygen via simple oxygen mask.  Post-op Assessment: Report given to PACU RN, vital signs reviewed and stable, moving all extremities.   Post vital signs: Reviewed and stable.  Complications: No apparent anesthesia complications  Last Vitals:  Vitals Value Taken Time  BP 125/76 01/29/2018  9:40 AM  Temp    Pulse 109 01/29/2018  9:42 AM  Resp 12 01/29/2018  9:42 AM  SpO2 100 % 01/29/2018  9:42 AM  Vitals shown include unvalidated device data.  Last Pain:  Vitals:   01/29/18 0634  TempSrc:   PainSc: 0-No pain         Complications: No apparent anesthesia complications

## 2018-01-29 NOTE — Op Note (Signed)
Preoperative diagnosis:  1. Bilateral renal pelvic tumors  Postoperative diagnosis:  1. Same  Procedure: 1. Bilateral retrograde pyelogram with interpretation  2. right ureteral stent exchange, left ureteral stent placement 3. Staged bilateral ureteroscopy, bilateral renal pelvic mass resection    Surgeon: Ardis Hughs, MD  Anesthesia: General  Complications: None  Intraoperative findings:  #1: The right retrograde pyelogram was performed through the dual-lumen catheter and demonstrated normal caliber ureter with calyx no contrast filling into the upper.  There is no hydronephrosis. #2: The left retrograde pyelogram demonstrated a filling defect in the renal pelvis with no additional findings including a normal caliber ureter no hydroureteronephrosis. #3: The patient had numerous papillary lesions in the upper pole calyx on the right-hand side.  The majority of this was removed by snaring it with the basket.  The laser was also used to ablate the the bases of these lesions.  There were still some remaining cancer there that I was unable to resect because of poor visualization. #4: There was a 1 cm lesion in the left medial aspect of the renal pelvis consistent with the patient's retrograde pyelogram.  This lesion was also snared with a 0 tip basket, no laser was used.  EBL: Minimal  Specimens: #1: Right renal pelvis tumors #2: Left renal pelvic tumor  Indication: [name] Case is a 84 y.o. patient with history of gross hematuria and a obstructing 2 mm right stone.  Work-up demonstrated low-grade right renal pelvic lesions and a high-grade left renal pelvic mass.  We discussed treatment options in detail and the patient opted to pursue an endoscopic approach to minimize any comorbidities associated with the cancer but not to put the patient through more surgery or treatment then she could handle without compromising her quality of life. .  After reviewing the management options  for treatment, he elected to proceed with the above surgical procedure(s). We have discussed the potential benefits and risks of the procedure, side effects of the proposed treatment, the likelihood of the patient achieving the goals of the procedure, and any potential problems that might occur during the procedure or recuperation. Informed consent has been obtained.  Description of procedure:  The patient was taken to the operating room and general anesthesia was induced.  The patient was placed in the dorsal lithotomy position, prepped and draped in the usual sterile fashion, and preoperative antibiotics were administered. A preoperative time-out was performed.   A 21 French 30 degrees cystoscope was then gently passed to the patient's urethra into the bladder under visual guidance.  Stent emanating from the patient's right ureteral orifice was then grabbed and pulled to the urethral meatus.  A 0.038 sensor wire was then advanced up through the stent and up into the right renal pelvis.  The stent was then removed over the wire.  A dual-lumen catheter was then advanced over the wire and retrograde pyelogram was performed with Omnipaque contrast, the above findings were noted.  A second wire was then advanced up to the dual-lumen catheter and it was removed over the wires.  I then advanced a 12/14 [place] x28 cm ureteral access sheath under fluoroscopic guidance into the proximal right ureter.  The inner portion and second wire were removed.  The flexible ureteroscope was then advanced through the access sheath and up into the right renal pelvis where there were numerous tumors noted in the upper pole calyx.  These were snared using a 0 tipped basket.  I then attempted to fulgurate  the base of these lesions.  Bleeding and poor visualization after a period of time began to obstruct my ability to progress.  At this point I slowly backed out the access sheath noting no additional areas of concern and then placed a  6 [place] x24 cm double-J ureteral stent under fluoroscopic guidance in the right collecting system in the routine fashion over a wire in a retrograde fashion.  I then advanced a 0.038 sensor wire into the patient's left ureteral orifice and advanced it up to the renal pelvis.  Using a dual-lumen catheter I advanced this catheter into the distal ureter performed retrograde pyelogram on the left side with the above findings using Omnipaque contract.  I then removed the dual access catheter and advanced the rigid ureteroscope through the urethra and into the bladder.  I was able to easily cannulate the left ureteral orifice and advanced it up into the renal pelvis.  Using a 0 tipped basket I snared the renal pelvic mass.  I removed 3 separate specimens from the same lesion.  At this point knowing that I was planning to return as a staged approach on the patient's right-hand side I opted to place a stent and come back in 10 to 14 days for laser ablation of the base of this lesion. at this point I then slowly backed out the ureteroscope noting no significant ureteral trauma.  I then advanced a 6 [place] x24 cm double-J stent over the left ureteral safety wire and under fluoroscopic guidance placed it up into the renal pelvis before slowly backing out the wire and pushing the stent up to the urethral meatus.  Once the stent was completely within the patient's urethra and the wire was then completely removed.  I then inserted the the cystoscopic sheath to drain the bladder.  Fluoroscopic images confirmed nice curls within the bladder as well as in the renal pelvis of both stents.  The patient was subsequently awoken and returned to the PACU in stable condition.  Ardis Hughs, M.D.

## 2018-01-29 NOTE — Anesthesia Preprocedure Evaluation (Addendum)
Anesthesia Evaluation  Patient identified by MRN, date of birth, ID band Patient awake    Reviewed: Allergy & Precautions, NPO status , Patient's Chart, lab work & pertinent test results  Airway Mallampati: II  TM Distance: >3 FB Neck ROM: Full    Dental no notable dental hx.    Pulmonary asthma , COPD,  COPD inhaler,    breath sounds clear to auscultation + decreased breath sounds      Cardiovascular hypertension, + dysrhythmias Atrial Fibrillation  Rhythm:Irregular Rate:Normal     Neuro/Psych negative neurological ROS  negative psych ROS   GI/Hepatic Neg liver ROS, GERD  ,  Endo/Other  diabetesHypothyroidism   Renal/GU negative Renal ROS  negative genitourinary   Musculoskeletal negative musculoskeletal ROS (+)   Abdominal   Peds negative pediatric ROS (+)  Hematology negative hematology ROS (+)   Anesthesia Other Findings   Reproductive/Obstetrics negative OB ROS                            Anesthesia Physical Anesthesia Plan  ASA: III  Anesthesia Plan: General   Post-op Pain Management:    Induction: Intravenous  PONV Risk Score and Plan: 2 and Dexamethasone, Ondansetron and Treatment may vary due to age or medical condition  Airway Management Planned: LMA  Additional Equipment:   Intra-op Plan:   Post-operative Plan: Extubation in OR  Informed Consent: I have reviewed the patients History and Physical, chart, labs and discussed the procedure including the risks, benefits and alternatives for the proposed anesthesia with the patient or authorized representative who has indicated his/her understanding and acceptance.   Dental advisory given  Plan Discussed with: CRNA and Surgeon  Anesthesia Plan Comments:         Anesthesia Quick Evaluation

## 2018-01-29 NOTE — H&P (View-Only) (Signed)
The patient presents today for follow-up. She initially presented to me with a 2 mm right obstructing stone. We attempted medical expulsion therapy but the patient quickly failed and returned to the emergency department 36 hours later. She subsequently underwent a right ureteral stent and was scheduled for outpatient ureteroscopy. At the time of the ureteroscopy stone was easily removed and with concern that there may be additional stone fragments in the renal pelvis I performed flexible ureteroscopy. At this time I encountered a right upper pole. Papillary lesions consistent with transitional cell carcinoma. These areas were biopsied and demonstrated low-grade urothelial carcinoma. The area was incompletely resected.   At the time of the procedure I opted to then proceed with left-sided retrograde pyelogram which also demonstrated a filling defect within the renal pelvis. I was unable to perform ureteroscopy at this time but did perform a urine cytology of the left renal pelvis. 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   The patient then underwent a CT scan, hematuria protocol. This demonstrated a filling defect within enhancing mass in the left renal pelvis as well as in the right renal pelvis/upper pole as seen on ureteroscopy.   The patient has a past medical history significant for congestive heart failure and atrial fibrillation for which she takes liquids. She also has decreased mobility because of right-sided hip pain and arthritis. She tolerated the stent over the past several weeks quite well. She did demonstrate some fatigue initially but has bounced back with quite a bit of vigor.     ALLERGIES: Codeine Derivatives Contrast Dye    MEDICATIONS: Benazepril Hcl 20 mg tablet Oral  Cephalexin 500 mg capsule Oral  Furosemide 80 mg tablet Oral  Levothyroxine Sodium 175 mcg tablet Oral  Metformin Hcl 500 mg tablet Oral  Metoprolol Tartrate 25  mg tablet Oral  Mucinex D 1,200 mg-120 mg tablet, extended release 12 hr Oral  Symbicort 160 mcg-4.5 mcg/actuation hfa aerosol with adapter Inhalation  Zofran 4 mg tablet 1 tablet PO Q 4 H     GU PSH: Cysto Uretero Biopsy Fulgura, Right - 12/24/2017 Cystoscopy Insert Stent, Right - 12/24/2017, Right - 12/04/2017 Hysterectomy Unilat SO - 2012 Locm 300-399Mg /Ml Iodine,1Ml - 01/07/2018 Ureteroscopic stone removal, Right - 12/24/2017      PSH Notes: Appendectomy, Hysterectomy, Knee Surgery   NON-GU PSH: Appendectomy - 2012    GU PMH: Ureteral calculus - 12/03/2017 Hemorrhagic cystitis, Acute hemorrhagic cystitis - 2016 Urinary incontinence, Unspec, Urinary incontinence - 2014      PMH Notes:  2010-11-25 14:49:27 - Note: Arthritis   NON-GU PMH: Encounter for general adult medical examination without abnormal findings, Encounter for preventive health examination - 2016 Personal history of other diseases of the circulatory system, History of heart failure - 2016, History of hypertension, - 2014 Personal history of other diseases of the respiratory system, History of asthma - 2016 Personal history of other endocrine, nutritional and metabolic disease, History of type 2 diabetes mellitus - 2016, History of hypercholesterolemia, - 2014, History of hypothyroidism, - 2014 Personal history of other diseases of the digestive system, History of esophageal reflux - 2014    FAMILY HISTORY: Cancer - Daughter Family Health Status Number - Runs In Family Father Deceased At Age21 ___ - Runs In Family Mother Deceased At Age 36 from diabetic complicati - Runs In Family Tuberculosis - Father   SOCIAL HISTORY: Marital Status: Widowed     Notes: Caffeine use, Never A Smoker, Occupation:, Marital History -  Currently Married, Tobacco Use, Alcohol Use   REVIEW OF SYSTEMS:    GU Review Female:   Patient reports frequent urination and get up at night to urinate. Patient denies hard to postpone urination, burning  /pain with urination, leakage of urine, stream starts and stops, trouble starting your stream, have to strain to urinate, and being pregnant.  Gastrointestinal (Upper):   Patient denies nausea, vomiting, and indigestion/ heartburn.  Gastrointestinal (Lower):   Patient denies diarrhea and constipation.  Constitutional:   Patient reports night sweats. Patient denies fever, weight loss, and fatigue.  Skin:   Patient denies skin rash/ lesion and itching.  Eyes:   Patient denies blurred vision and double vision.  Ears/ Nose/ Throat:   Patient denies sore throat and sinus problems.  Hematologic/Lymphatic:   Patient reports easy bruising. Patient denies swollen glands.  Cardiovascular:   Patient denies chest pains and leg swelling.  Respiratory:   Patient denies cough and shortness of breath.  Endocrine:   Patient denies excessive thirst.  Musculoskeletal:   Patient denies back pain and joint pain.  Neurological:   Patient denies headaches and dizziness.  Psychologic:   Patient denies depression and anxiety.   Notes: Pt gets up 3-4 times at night     VITAL SIGNS:      01/11/2018 04:14 PM  Weight 220 lb / 99.79 kg  Height 61 in / 154.94 cm  BP 130/82 mmHg  Heart Rate 98 /min  BMI 41.6 kg/m   MULTI-SYSTEM PHYSICAL EXAMINATION:    Constitutional: Well-nourished. No physical deformities. Normally developed. Good grooming.  Respiratory: No labored breathing, no use of accessory muscles. Normal breath sounds.  Cardiovascular: Irregularly irregular, no murmur appreciated     PAST DATA REVIEWED:  Source Of History:  Patient  Records Review:   Pathology Reports, Previous Doctor Records, Previous Patient Records, POC Tool  X-Ray Review: C.T. Abdomen/Pelvis: Reviewed Films. Discussed With Patient.     PROCEDURES: None   ASSESSMENT:      ICD-10 Details  1 GU:   Renal pelvis cancer, right - C65.1   2   Renal pelvis cancer, left - C65.2    PLAN:           Orders Labs Urine Culture           Document Letter(s):  Created for Patient: Clinical Summary         Notes:   The patient has what appears to be bilateral upper tract transitional cell carcinoma. The right sided lesions are low-grade and located in the upper pole. The tumor was incompletely resected at the time of her last procedure. The area and the left appears to be somewhat smaller, located in the left renal pelvis, and was noted to be high-grade on the cytology.   I went through the diagnosis with the patient and her family in detail. We discussed treatment goals as well as management options. The patient values quality-of-life and is not willing to go through any major surgeries. However, she also wants to minimize any morbidity associated with her disease.   Given the above, I recommended that we manage her cancer endoscopically through repeat ureteroscopy. Hopefully we can get both of these lesions in 2 staged surgeries. I suspect that each one of these were roughly require out 90 minutes of operative time.   Following complete excision and ultimately stent removal a week or so later, the patient will then need to return to the operating room 3 months later for surveillance.  The patient understands the surgery as does her family. We discussed the risks and the expected benefits. Hopefully she will get through these operations without too much difficulty. She will need to stop her Ellik was. If the patient seems to do poorly following one of the surgeries, we can always opt not to proceed with any additional intervention at that time. However, as of now, we will try to cure her endoscopically knowing that she has a high chance of recurrence on the left, and this may be difficult given the amount of tumor on the right. Ultimately, this is a better option for her then major surgery or kidney removal.   Ultimately, given the history of the patient's family and the incidence of ureteral cancer as well as colon cancer, the patient  should be screened for Lynch syndrome. We will make a referral to genetics following the treatment of these lesions.

## 2018-01-29 NOTE — H&P (Signed)
The patient presents today for follow-up. She initially presented to me with a 2 mm right obstructing stone. We attempted medical expulsion therapy but the patient quickly failed and returned to the emergency department 36 hours later. She subsequently underwent a right ureteral stent and was scheduled for outpatient ureteroscopy. At the time of the ureteroscopy stone was easily removed and with concern that there may be additional stone fragments in the renal pelvis I performed flexible ureteroscopy. At this time I encountered a right upper pole. Papillary lesions consistent with transitional cell carcinoma. These areas were biopsied and demonstrated low-grade urothelial carcinoma. The area was incompletely resected.   At the time of the procedure I opted to then proceed with left-sided retrograde pyelogram which also demonstrated a filling defect within the renal pelvis. I was unable to perform ureteroscopy at this time but did perform a urine cytology of the left renal pelvis. 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   The patient then underwent a CT scan, hematuria protocol. This demonstrated a filling defect within enhancing mass in the left renal pelvis as well as in the right renal pelvis/upper pole as seen on ureteroscopy.   The patient has a past medical history significant for congestive heart failure and atrial fibrillation for which she takes liquids. She also has decreased mobility because of right-sided hip pain and arthritis. She tolerated the stent over the past several weeks quite well. She did demonstrate some fatigue initially but has bounced back with quite a bit of vigor.     ALLERGIES: Codeine Derivatives Contrast Dye    MEDICATIONS: Benazepril Hcl 20 mg tablet Oral  Cephalexin 500 mg capsule Oral  Furosemide 80 mg tablet Oral  Levothyroxine Sodium 175 mcg tablet Oral  Metformin Hcl 500 mg tablet Oral  Metoprolol Tartrate 25  mg tablet Oral  Mucinex D 1,200 mg-120 mg tablet, extended release 12 hr Oral  Symbicort 160 mcg-4.5 mcg/actuation hfa aerosol with adapter Inhalation  Zofran 4 mg tablet 1 tablet PO Q 4 H     GU PSH: Cysto Uretero Biopsy Fulgura, Right - 12/24/2017 Cystoscopy Insert Stent, Right - 12/24/2017, Right - 12/04/2017 Hysterectomy Unilat SO - 2012 Locm 300-399Mg /Ml Iodine,1Ml - 01/07/2018 Ureteroscopic stone removal, Right - 12/24/2017      PSH Notes: Appendectomy, Hysterectomy, Knee Surgery   NON-GU PSH: Appendectomy - 2012    GU PMH: Ureteral calculus - 12/03/2017 Hemorrhagic cystitis, Acute hemorrhagic cystitis - 2016 Urinary incontinence, Unspec, Urinary incontinence - 2014      PMH Notes:  2010-11-25 14:49:27 - Note: Arthritis   NON-GU PMH: Encounter for general adult medical examination without abnormal findings, Encounter for preventive health examination - 2016 Personal history of other diseases of the circulatory system, History of heart failure - 2016, History of hypertension, - 2014 Personal history of other diseases of the respiratory system, History of asthma - 2016 Personal history of other endocrine, nutritional and metabolic disease, History of type 2 diabetes mellitus - 2016, History of hypercholesterolemia, - 2014, History of hypothyroidism, - 2014 Personal history of other diseases of the digestive system, History of esophageal reflux - 2014    FAMILY HISTORY: Cancer - Daughter Family Health Status Number - Runs In Family Father Deceased At Age87 ___ - Runs In Family Mother Deceased At Age 96 from diabetic complicati - Runs In Family Tuberculosis - Father   SOCIAL HISTORY: Marital Status: Widowed     Notes: Caffeine use, Never A Smoker, Occupation:, Marital History -  Currently Married, Tobacco Use, Alcohol Use   REVIEW OF SYSTEMS:    GU Review Female:   Patient reports frequent urination and get up at night to urinate. Patient denies hard to postpone urination, burning  /pain with urination, leakage of urine, stream starts and stops, trouble starting your stream, have to strain to urinate, and being pregnant.  Gastrointestinal (Upper):   Patient denies nausea, vomiting, and indigestion/ heartburn.  Gastrointestinal (Lower):   Patient denies diarrhea and constipation.  Constitutional:   Patient reports night sweats. Patient denies fever, weight loss, and fatigue.  Skin:   Patient denies skin rash/ lesion and itching.  Eyes:   Patient denies blurred vision and double vision.  Ears/ Nose/ Throat:   Patient denies sore throat and sinus problems.  Hematologic/Lymphatic:   Patient reports easy bruising. Patient denies swollen glands.  Cardiovascular:   Patient denies chest pains and leg swelling.  Respiratory:   Patient denies cough and shortness of breath.  Endocrine:   Patient denies excessive thirst.  Musculoskeletal:   Patient denies back pain and joint pain.  Neurological:   Patient denies headaches and dizziness.  Psychologic:   Patient denies depression and anxiety.   Notes: Pt gets up 3-4 times at night     VITAL SIGNS:      01/11/2018 04:14 PM  Weight 220 lb / 99.79 kg  Height 61 in / 154.94 cm  BP 130/82 mmHg  Heart Rate 98 /min  BMI 41.6 kg/m   MULTI-SYSTEM PHYSICAL EXAMINATION:    Constitutional: Well-nourished. No physical deformities. Normally developed. Good grooming.  Respiratory: No labored breathing, no use of accessory muscles. Normal breath sounds.  Cardiovascular: Irregularly irregular, no murmur appreciated     PAST DATA REVIEWED:  Source Of History:  Patient  Records Review:   Pathology Reports, Previous Doctor Records, Previous Patient Records, POC Tool  X-Ray Review: C.T. Abdomen/Pelvis: Reviewed Films. Discussed With Patient.     PROCEDURES: None   ASSESSMENT:      ICD-10 Details  1 GU:   Renal pelvis cancer, right - C65.1   2   Renal pelvis cancer, left - C65.2    PLAN:           Orders Labs Urine Culture           Document Letter(s):  Created for Patient: Clinical Summary         Notes:   The patient has what appears to be bilateral upper tract transitional cell carcinoma. The right sided lesions are low-grade and located in the upper pole. The tumor was incompletely resected at the time of her last procedure. The area and the left appears to be somewhat smaller, located in the left renal pelvis, and was noted to be high-grade on the cytology.   I went through the diagnosis with the patient and her family in detail. We discussed treatment goals as well as management options. The patient values quality-of-life and is not willing to go through any major surgeries. However, she also wants to minimize any morbidity associated with her disease.   Given the above, I recommended that we manage her cancer endoscopically through repeat ureteroscopy. Hopefully we can get both of these lesions in 2 staged surgeries. I suspect that each one of these were roughly require out 90 minutes of operative time.   Following complete excision and ultimately stent removal a week or so later, the patient will then need to return to the operating room 3 months later for surveillance.  The patient understands the surgery as does her family. We discussed the risks and the expected benefits. Hopefully she will get through these operations without too much difficulty. She will need to stop her Ellik was. If the patient seems to do poorly following one of the surgeries, we can always opt not to proceed with any additional intervention at that time. However, as of now, we will try to cure her endoscopically knowing that she has a high chance of recurrence on the left, and this may be difficult given the amount of tumor on the right. Ultimately, this is a better option for her then major surgery or kidney removal.   Ultimately, given the history of the patient's family and the incidence of ureteral cancer as well as colon cancer, the patient  should be screened for Lynch syndrome. We will make a referral to genetics following the treatment of these lesions.

## 2018-01-29 NOTE — Discharge Instructions (Signed)
Ureteroscopy, Care After This sheet gives you information about how to care for yourself after your procedure. Your health care provider may also give you more specific instructions. If you have problems or questions, contact your health care provider. What can I expect after the procedure? After the procedure, it is common to have:  A burning sensation when you urinate.  Blood in your urine.  Mild discomfort in the bladder area or kidney area when urinating.  Needing to urinate more often or urgently.  Follow these instructions at home: Medicines  Take over-the-counter and prescription medicines only as told by your health care provider.  If you were prescribed an antibiotic medicine, take it as told by your health care provider. Do not stop taking the antibiotic even if you start to feel better. General instructions   Donot drive for 24 hours if you were given a medicine to help you relax (sedative) during your procedure.  To relieve burning, try taking a warm bath or holding a warm washcloth over your groin.  Drink enough fluid to keep your urine clear or pale yellow. ? Drink two 8-ounce glasses of water every hour for the first 2 hours after you get home. ? Continue to drink water often at home.  You can eat what you usually do.  Keep all follow-up visits as told by your health care provider. This is important. ? If you had a tube placed to keep urine flowing (ureteral stent), ask your health care provider when you need to return to have it removed. Contact a health care provider if:  You have chills or a fever.  You have burning pain for longer than 24 hours after the procedure.  You have blood in your urine for longer than 24 hours after the procedure. Get help right away if:  You have large amounts of blood in your urine.  You have blood clots in your urine.  You have very bad pain.  You have chest pain or trouble breathing.  You are unable to urinate and  you have the feeling of a full bladder. This information is not intended to replace advice given to you by your health care provider. Make sure you discuss any questions you have with your health care provider. Document Released: 08/23/2013 Document Revised: 06/03/2016 Document Reviewed: 05/30/2016 Elsevier Interactive Patient Education  2018 [employer] Anesthesia, Adult, Care After These instructions provide you with information about caring for yourself after your procedure. Your health care provider may also give you more specific instructions. Your treatment has been planned according to current medical practices, but problems sometimes occur. Call your health care provider if you have any problems or questions after your procedure. What can I expect after the procedure? After the procedure, it is common to have:  Vomiting.  A sore throat.  Mental slowness.  It is common to feel:  Nauseous.  Cold or shivery.  Sleepy.  Tired.  Sore or achy, even in parts of your body where you did not have surgery.  Follow these instructions at home: For at least 24 hours after the procedure:  Do not: ? Participate in activities where you could fall or become injured. ? Drive. ? Use heavy machinery. ? Drink alcohol. ? Take sleeping pills or medicines that cause drowsiness. ? Make important decisions or sign legal documents. ? Take care of children on your own.  Rest. Eating and drinking  If you vomit, drink water, juice, or soup when you can drink  without vomiting.  Drink enough fluid to keep your urine clear or pale yellow.  Make sure you have little or no nausea before eating solid foods.  Follow the diet recommended by your health care provider. General instructions  Have a responsible adult stay with you until you are awake and alert.  Return to your normal activities as told by your health care provider. Ask your health care provider what activities are safe  for you.  Take over-the-counter and prescription medicines only as told by your health care provider.  If you smoke, do not smoke without supervision.  Keep all follow-up visits as told by your health care provider. This is important. Contact a health care provider if:  You continue to have nausea or vomiting at home, and medicines are not helpful.  You cannot drink fluids or start eating again.  You cannot urinate after 8-12 hours.  You develop a skin rash.  You have fever.  You have increasing redness at the site of your procedure. Get help right away if:  You have difficulty breathing.  You have chest pain.  You have unexpected bleeding.  You feel that you are having a life-threatening or urgent problem. This information is not intended to replace advice given to you by your health care provider. Make sure you discuss any questions you have with your health care provider. Document Released: 11/24/2000 Document Revised: 01/21/2016 Document Reviewed: 08/02/2015 Elsevier Interactive Patient Education  [employer].

## 2018-01-29 NOTE — Progress Notes (Signed)
Notified Pharmacy pt is allergic to codeine and Dr. Kalman Shan ordered 5mg  Oxycodone for the patient. Per Pharmacist Altha Harm, pt has received oxycodone before and several other medicines with that in it. Per pharmacy, the patient is okay to take 5mg  Oxycodone. Will continue to monitor.

## 2018-01-29 NOTE — Anesthesia Postprocedure Evaluation (Signed)
Anesthesia Post Note  Patient: [name] Case  Procedure(s) Performed: BILATERAL URETEROSCOPY AND TUMOR RESECTION  WITH LASER RIGHT STENT EXCHANGE LEFT STENT PLACEMENT (Bilateral )     Patient location during evaluation: PACU Anesthesia Type: General Level of consciousness: awake and alert Pain management: pain level controlled Vital Signs Assessment: post-procedure vital signs reviewed and stable Respiratory status: spontaneous breathing, nonlabored ventilation, respiratory function stable and patient connected to nasal cannula oxygen Cardiovascular status: blood pressure returned to baseline and stable Postop Assessment: no apparent nausea or vomiting Anesthetic complications: no    Last Vitals:  Vitals:   01/29/18 1030 01/29/18 1035  BP: (!) 124/59   Pulse: 60 72  Resp: 19 15  Temp:    SpO2: 96% 93%    Last Pain:  Vitals:   01/29/18 1030  TempSrc:   PainSc: Asleep                 Kohner Orlick S

## 2018-01-29 NOTE — Anesthesia Procedure Notes (Signed)
Procedure Name: LMA Insertion Date/Time: 01/29/2018 7:41 AM Performed by: Deliah Boston, CRNA Pre-anesthesia Checklist: Patient identified, Emergency Drugs available, Suction available and Patient being monitored Patient Re-evaluated:Patient Re-evaluated prior to induction Oxygen Delivery Method: Circle system utilized Preoxygenation: Pre-oxygenation with 100% oxygen Induction Type: IV induction Ventilation: Mask ventilation without difficulty LMA: LMA inserted LMA Size: 4.0 Number of attempts: 1 Placement Confirmation: positive ETCO2 and breath sounds checked- equal and bilateral Tube secured with: Tape Dental Injury: Teeth and Oropharynx as per pre-operative assessment

## 2018-01-29 NOTE — Progress Notes (Signed)
Notified Dr. Kalman Shan pt is in 8/10 pain and oxygen saturations are 90-93. Per [facility] pt has severe COPD oxygen is fine there. [facility] ordered 5mg  oxycodone. Will continue to monitor.

## 2018-01-30 ENCOUNTER — Encounter (HOSPITAL_COMMUNITY): Payer: Self-pay | Admitting: Urology

## 2018-01-31 ENCOUNTER — Emergency Department (HOSPITAL_COMMUNITY): Payer: Medicare Other

## 2018-01-31 ENCOUNTER — Encounter (HOSPITAL_COMMUNITY): Payer: Self-pay | Admitting: *Deleted

## 2018-01-31 ENCOUNTER — Emergency Department (HOSPITAL_COMMUNITY)
Admission: EM | Admit: 2018-01-31 | Discharge: 2018-01-31 | Disposition: A | Discharge status: Home / Self Care | Payer: Medicare Other | Attending: Emergency Medicine | Admitting: Emergency Medicine

## 2018-01-31 DIAGNOSIS — R110 Nausea: Secondary | ICD-10-CM | POA: Diagnosis not present

## 2018-01-31 DIAGNOSIS — R1032 Left lower quadrant pain: Secondary | ICD-10-CM | POA: Diagnosis not present

## 2018-01-31 DIAGNOSIS — Z79899 Other long term (current) drug therapy: Secondary | ICD-10-CM | POA: Diagnosis not present

## 2018-01-31 DIAGNOSIS — R1012 Left upper quadrant pain: Secondary | ICD-10-CM | POA: Diagnosis not present

## 2018-01-31 DIAGNOSIS — Z9889 Other specified postprocedural states: Secondary | ICD-10-CM | POA: Insufficient documentation

## 2018-01-31 DIAGNOSIS — R310 Gross hematuria: Secondary | ICD-10-CM

## 2018-01-31 DIAGNOSIS — I10 Essential (primary) hypertension: Secondary | ICD-10-CM | POA: Insufficient documentation

## 2018-01-31 DIAGNOSIS — R109 Unspecified abdominal pain: Secondary | ICD-10-CM | POA: Diagnosis not present

## 2018-01-31 DIAGNOSIS — E119 Type 2 diabetes mellitus without complications: Secondary | ICD-10-CM | POA: Diagnosis not present

## 2018-01-31 DIAGNOSIS — J449 Chronic obstructive pulmonary disease, unspecified: Secondary | ICD-10-CM | POA: Insufficient documentation

## 2018-01-31 LAB — URINALYSIS, ROUTINE W REFLEX MICROSCOPIC

## 2018-01-31 LAB — CBC WITH DIFFERENTIAL/PLATELET
BASOS ABS: 0 10*3/uL (ref 0.0–0.1)
Basophils Relative: 0 %
EOS ABS: 0 10*3/uL (ref 0.0–0.7)
Eosinophils Relative: 0 %
HEMATOCRIT: 36.5 % (ref 36.0–46.0)
HEMOGLOBIN: 11.5 g/dL — AB (ref 12.0–15.0)
Lymphocytes Relative: 10 %
Lymphs Abs: 1.5 10*3/uL (ref 0.7–4.0)
MCH: 30.7 pg (ref 26.0–34.0)
MCHC: 31.5 g/dL (ref 30.0–36.0)
MCV: 97.3 fL (ref 78.0–100.0)
MONO ABS: 0.8 10*3/uL (ref 0.1–1.0)
MONOS PCT: 5 %
NEUTROS ABS: 12.8 10*3/uL — AB (ref 1.7–7.7)
NEUTROS PCT: 85 %
Platelets: 211 10*3/uL (ref 150–400)
RBC: 3.75 MIL/uL — ABNORMAL LOW (ref 3.87–5.11)
RDW: 14.2 % (ref 11.5–15.5)
WBC: 15 10*3/uL — ABNORMAL HIGH (ref 4.0–10.5)

## 2018-01-31 LAB — COMPREHENSIVE METABOLIC PANEL
ALK PHOS: 70 U/L (ref 38–126)
ALT: 20 U/L (ref 14–54)
ANION GAP: 9 (ref 5–15)
AST: 19 U/L (ref 15–41)
Albumin: 3.6 g/dL (ref 3.5–5.0)
BILIRUBIN TOTAL: 0.6 mg/dL (ref 0.3–1.2)
BUN: 37 mg/dL — ABNORMAL HIGH (ref 6–20)
CO2: 29 mmol/L (ref 22–32)
CREATININE: 1.17 mg/dL — AB (ref 0.44–1.00)
Calcium: 9.2 mg/dL (ref 8.9–10.3)
Chloride: 103 mmol/L (ref 101–111)
GFR calc non Af Amer: 42 mL/min — ABNORMAL LOW (ref >60)
GFR, EST AFRICAN AMERICAN: 48 mL/min — AB (ref >60)
Glucose, Bld: 125 mg/dL — ABNORMAL HIGH (ref 65–99)
Potassium: 4.2 mmol/L (ref 3.5–5.1)
Sodium: 141 mmol/L (ref 135–145)
TOTAL PROTEIN: 6.5 g/dL (ref 6.5–8.1)

## 2018-01-31 LAB — PROTIME-INR
INR: 1.15
PROTHROMBIN TIME: 14.6 s (ref 11.4–15.2)

## 2018-01-31 LAB — URINALYSIS, MICROSCOPIC (REFLEX)

## 2018-01-31 MED ORDER — FENTANYL 12 MCG/HR TD PT72
12.5000 ug | MEDICATED_PATCH | Freq: Once | TRANSDERMAL | Status: DC
  Administered 2018-01-31: 12.5 ug via TRANSDERMAL

## 2018-01-31 MED ORDER — SENNOSIDES-DOCUSATE SODIUM 8.6-50 MG PO TABS: 2.0000 | ORAL_TABLET | Freq: Every day | ORAL | 0 refills | Status: AC

## 2018-01-31 MED ORDER — CIPROFLOXACIN HCL 500 MG PO TABS
500.0000 mg | ORAL_TABLET | Freq: Once | ORAL | Status: AC
  Administered 2018-01-31: 500 mg via ORAL
  Filled 2018-01-31: qty 1

## 2018-01-31 MED ORDER — CIPROFLOXACIN HCL 500 MG PO TABS: 500.0000 mg | ORAL_TABLET | Freq: Two times a day (BID) | ORAL | 0 refills | Status: DC

## 2018-01-31 NOTE — ED Provider Notes (Addendum)
[facility] DEPT Provider Note   CSN: [id number] Arrival date & time: 01/31/18  0815     History   Chief Complaint Chief Complaint  Patient presents with  . Flank Pain    HPI [name] Case is a 84 y.o. female.  HPI Patient presents 2 days after operative procedure with urology, now with concern for ongoing flank pain, nausea, weakness, subjective fever and chills. Patient was diagnosed with transitional cell malignancy last month, has had stenting in the right and the left ureter, and 2 days ago had a cystoscopic procedure, with removal of cancerous lesions from the left kidney. She notes that not long after returning home she had subjective fever, chills, and has had persistent nausea, anorexia, and pain since that time. No bowel movements since prior to the procedure, though she is passing gas. The discomfort in the abdomen is primarily in the left side, flank, left upper abdomen. No relief with taking her typical medication, and decreased ability to take medication secondary to nausea and anorexia. Today the patient called the urology on-call service, and was referred here for evaluation. Past Medical History:  Diagnosis Date  . Allergy   . Arthritis   . Atrial fibrillation ([place])   . Bronchitis   . Cancer ([place])    Bilateral upper tract transitional cell carcinoma  . Cardiomegaly   . CHF (congestive heart failure) ([place])   . Complication of anesthesia    Difficult to arouse  . COPD (chronic obstructive pulmonary disease) ([place])   . Diabetes mellitus without complication ([place])    Controlled by diet and exercise  . Diverticulosis of sigmoid colon   . Fatty liver   . GERD (gastroesophageal reflux disease)   . History of hiatal hernia 01/20/2015   Small sliding, noted on CT  . History of kidney stones   . Hyperlipidemia   . Hypertension   . Hypothyroidism   . Supraumbilical hernia 70/26/3785   Small noted on CT  . Thyroid disease   .  UTI (urinary tract infection)     Patient Active Problem List   Diagnosis Date Noted  . Pyohydronephrosis 12/04/2017  . Lichen planus 88/50/2774  . Acute respiratory failure with hypoxia ([place])   . Obesity hypoventilation syndrome ([place])   . Chronic CHF (congestive heart failure) ([place]) 01/09/2016  . Atrial fibrillation with RVR ([place]) 01/09/2016  . History of snoring 05/20/2015  . Chronic respiratory failure with hypercapnia ([place])   . Type 2 diabetes mellitus without complication ([place])   . Atrial fibrillation ([place]) 01/06/2015  . Hypothyroidism 03/09/2007  . Hyperlipidemia 03/09/2007  . Morbid obesity ([place]) 03/09/2007  . Essential hypertension 03/09/2007  . ALLERGIC RHINITIS 03/09/2007  . Asthma 03/09/2007  . GERD 03/09/2007  . INCONTINENCE 03/09/2007    Past Surgical History:  Procedure Laterality Date  . ABDOMINAL HYSTERECTOMY    . APPENDECTOMY    . CHOLECYSTECTOMY    . COLONOSCOPY  07/16/2009  . CYSTOSCOPY W/ URETERAL STENT PLACEMENT Right 12/04/2017   Procedure: CYSTOSCOPY WITH RETROGRADE PYELOGRAM/URETERAL STENT PLACEMENT;  Surgeon: Festus Aloe, MD;  Location: [facility];  Service: Urology;  Laterality: Right;  . CYSTOSCOPY WITH URETEROSCOPY AND STENT PLACEMENT Bilateral 01/29/2018   Procedure: BILATERAL URETEROSCOPY AND TUMOR RESECTION  WITH LASER RIGHT STENT EXCHANGE LEFT STENT PLACEMENT;  Surgeon: Ardis Hughs, MD;  Location: [facility];  Service: Urology;  Laterality: Bilateral;  . CYSTOSCOPY/RETROGRADE/URETEROSCOPY Bilateral 12/24/2017   Procedure: BILATERAL URETEROSCOPY, RIGHT STENT EXCHANGE, STONE EXTRACTION WITH BASKET BILATERAL RETROGRADE  PYELOGRAM, BIOPSY OF RIGHT UPPER POLE TUMOR,  BILATERAL RENAL PELVIS FLUID SENT FOR CYTOLOGY;  Surgeon: Ardis Hughs, MD;  Location: [facility];  Service: Urology;  Laterality: Bilateral;  . KNEE SURGERY Right   . KNEE SURGERY Left    torn ligament repair  . UPPER GI ENDOSCOPY       OB History   None      Home  Medications    Prior to Admission medications   Medication Sig Start Date End Date Taking? Authorizing Provider  acetaminophen (TYLENOL) 500 MG tablet Take 1,000 mg by mouth daily as needed for moderate pain or headache.   Yes [provider]  apixaban (ELIQUIS) 5 MG TABS tablet Take 1 tablet (5 mg total) by mouth 2 (two) times daily. Resume once the bleeding has stopped from the surgery for 2 days. 12/24/17  Yes Ardis Hughs, MD  atorvastatin (LIPITOR) 20 MG tablet TAKE 1 TABLET BY MOUTH DAILY AT 6 PM 01/26/18  Yes Hoyt Koch, MD  diltiazem (CARDIZEM CD) 180 MG 24 hr capsule Take 1 capsule (180 mg total) by mouth daily. 10/15/17  Yes Hoyt Koch, MD  furosemide (LASIX) 80 MG tablet TAKE 1/2 TABLET BY MOUTH DAILY 12/08/17  Yes Hoyt Koch, MD  irbesartan (AVAPRO) 150 MG tablet Take 0.5 tablets (75 mg total) by mouth daily. 06/03/17  Yes Hoyt Koch, MD  levothyroxine (SYNTHROID, LEVOTHROID) 100 MCG tablet Take 1 tablet (100 mcg total) by mouth daily. Needs annual visit with labs for further refills 12/17/17  Yes Hoyt Koch, MD  metoprolol succinate (TOPROL-XL) 50 MG 24 hr tablet TAKE 1 TABLET BY MOUTH DAILY, TAKE WITH OR IMMEDIATELY FOLLOWING A MEAL 08/03/17  Yes Jerline Pain, MD  ranitidine (ZANTAC) 150 MG tablet Take 150 mg by mouth daily as needed for heartburn.   Yes [provider]  traMADol (ULTRAM) 50 MG tablet Take 1 tablet (50 mg total) by mouth every 6 (six) hours as needed. Patient taking differently: Take 50 mg by mouth every 6 (six) hours as needed for moderate pain.  12/07/17  Yes Purohit, Konrad Dolores, MD  albuterol (PROVENTIL) (2.5 MG/3ML) 0.083% nebulizer solution Take 3 mLs (2.5 mg total) by nebulization every 6 (six) hours as needed for wheezing or shortness of breath. 10/28/17   Danford, Suann Larry, MD    Family History Family History  Problem Relation Age of Onset  . Heart disease Mother   . Colon cancer  Mother     Social History Social History   Tobacco Use  . Smoking status: Never Smoker  . Smokeless tobacco: Never Used  Substance Use Topics  . Alcohol use: No    Alcohol/week: 0.0 oz  . Drug use: No     Allergies   Codeine   Review of Systems Review of Systems  Constitutional:       Per HPI, otherwise negative  HENT:       Per HPI, otherwise negative  Respiratory:       Per HPI, otherwise negative  Cardiovascular:       Per HPI, otherwise negative  Gastrointestinal: Positive for abdominal pain, nausea and vomiting.  Endocrine:       Negative aside from HPI  Genitourinary:       Neg aside from HPI   Musculoskeletal:       Per HPI, otherwise negative  Skin: Negative.   Allergic/Immunologic: Positive for immunocompromised state.  Neurological: Positive for weakness. Negative for syncope.  Physical Exam Updated Vital Signs BP (!) 146/101   Pulse 74   Temp 98.3 F (36.8 C) (Oral)   Resp 19   LMP  (LMP Unknown)   SpO2 96%   Physical Exam  Constitutional: She is oriented to person, place, and time. She appears well-developed and well-nourished. No distress.  HENT:  Head: Normocephalic and atraumatic.  Eyes: Conjunctivae and EOM are normal.  Cardiovascular: Normal rate and regular rhythm.  Pulmonary/Chest: Effort normal and breath sounds normal. No stridor. No respiratory distress.  Abdominal: She exhibits no distension.  Large, non-peritoneal abdomen, tender to palpation throughout, primarily on the left side  Musculoskeletal: She exhibits no edema.  Neurological: She is alert and oriented to person, place, and time. No cranial nerve deficit.  Skin: Skin is warm and dry.  Psychiatric: She has a normal mood and affect.  Nursing note and vitals reviewed.    ED Treatments / Results  Labs (all labs ordered are listed, but only abnormal results are displayed) Labs Reviewed  COMPREHENSIVE METABOLIC PANEL - Abnormal; Notable for the following  components:      Result Value   Glucose, Bld 125 (*)    BUN 37 (*)    Creatinine, Ser 1.17 (*)    GFR calc non Af Amer 42 (*)    GFR calc Af Amer 48 (*)    All other components within normal limits  CBC WITH DIFFERENTIAL/PLATELET - Abnormal; Notable for the following components:   WBC 15.0 (*)    RBC 3.75 (*)    Hemoglobin 11.5 (*)    Neutro Abs 12.8 (*)    All other components within normal limits  URINALYSIS, ROUTINE W REFLEX MICROSCOPIC - Abnormal; Notable for the following components:   Color, Urine RED (*)    APPearance TURBID (*)    Glucose, UA   (*)    Value: TEST NOT REPORTED DUE TO COLOR INTERFERENCE OF URINE PIGMENT   Hgb urine dipstick   (*)    Value: TEST NOT REPORTED DUE TO COLOR INTERFERENCE OF URINE PIGMENT   Bilirubin Urine   (*)    Value: TEST NOT REPORTED DUE TO COLOR INTERFERENCE OF URINE PIGMENT   Ketones, ur   (*)    Value: TEST NOT REPORTED DUE TO COLOR INTERFERENCE OF URINE PIGMENT   Protein, ur   (*)    Value: TEST NOT REPORTED DUE TO COLOR INTERFERENCE OF URINE PIGMENT   Nitrite   (*)    Value: TEST NOT REPORTED DUE TO COLOR INTERFERENCE OF URINE PIGMENT   Leukocytes, UA   (*)    Value: TEST NOT REPORTED DUE TO COLOR INTERFERENCE OF URINE PIGMENT   All other components within normal limits  URINALYSIS, MICROSCOPIC (REFLEX) - Abnormal; Notable for the following components:   Bacteria, UA RARE (*)    All other components within normal limits  PROTIME-INR    EKG None  Radiology Dg Abdomen 1 View  Result Date: 01/31/2018 CLINICAL DATA:  Left flank pain. Patient status post left double-J stent placement 01/29/2018. EXAM: ABDOMEN - 1 VIEW COMPARISON:  CT abdomen and pelvis 01/07/2018. Intraoperative imaging 01/29/2018. FINDINGS: Bilateral double-J ureteral stents are in place and project in good position. No evidence of urinary tract stone is identified. Bowel gas pattern is normal. No acute bony abnormality. IMPRESSION: No acute finding. Bilateral  double-J ureteral stones project in good position. Electronically Signed   By: Inge Rise M.D.   On: 01/31/2018 10:21    Procedures Procedures (including  critical care time)  Medications Ordered in ED Medications - No data to display   Initial Impression / Assessment and Plan / ED Course  I have reviewed the triage vital signs and the nursing notes.  Pertinent labs & imaging results that were available during my care of the patient were reviewed by me and considered in my medical decision making (see chart for details).    After the initial evaluation I read the patient's chart including documentation from her procedure 2 days ago, with Intra-Op findings as below Intraoperative findings:  #1: The right retrograde pyelogram was performed through the dual-lumen catheter and demonstrated normal caliber ureter with calyx no contrast filling into the upper.  There is no hydronephrosis. #2: The left retrograde pyelogram demonstrated a filling defect in the renal pelvis with no additional findings including a normal caliber ureter no hydroureteronephrosis. #3: The patient had numerous papillary lesions in the upper pole calyx on the right-hand side.  The majority of this was removed by snaring it with the basket.  The laser was also used to ablate the the bases of these lesions.  There were still some remaining cancer there that I was unable to resect because of poor visualization. #4: There was a 1 cm lesion in the left medial aspect of the renal pelvis consistent with the patient's retrograde pyelogram.  This lesion was also snared with a 0 tip basket, no laser was used.   Subsequently discussed patient's case with our urology colleagues, we agreed that given the patient's recent procedure, concern for possible infection, she will require additional pain medication, antibiotics, will follow-up in the office.  11:19 AM Patient calm, resting, remains hemodynamically unremarkable, with no  increased work of breathing. I discussed the findings with her and her daughter.  This elderly female presents 2 days after urology procedure now with ongoing discomfort, hematuria, nausea, pain. Patient is awake, alert, afebrile, with a non-peritoneal abdomen, no fever, leukocytosis consistent with procedural effect. Urinalysis nondiagnostic given the amount of blood. After discussion with urology, the patient will receive antibiotics, follow-up with him in the office with increased analgesia, as well as a bowel habit regimen.  Patient has substantial allergies, fentanyl has been using the past for pain control, and she will receive a fentanyl patch on discharge. Final Clinical Impressions(s) / ED Diagnoses  Abdominal pain Gross hematuria   Carmin Muskrat, MD 01/31/18 1120    Carmin Muskrat, MD 01/31/18 1126

## 2018-01-31 NOTE — ED Triage Notes (Signed)
Pt complains of left flank pain. Pt had laser surgery to remove left side renal tumor 3 days ago, also had stent placed.

## 2018-01-31 NOTE — Discharge Instructions (Addendum)
As discussed, today's evaluation was generally reassuring, but it is important he follow-up with your urology team for further evaluation and management.  You have been provided a  patch for continuous pain relief. Please leave this patch in place, as we will release medication for the next 72 hours. Discuss how this is working for pain control with your physician when you follow-up.  Return here for concerning changes in your condition.

## 2018-02-01 DIAGNOSIS — R1084 Generalized abdominal pain: Secondary | ICD-10-CM | POA: Diagnosis not present

## 2018-02-09 ENCOUNTER — Encounter (HOSPITAL_COMMUNITY): Payer: Self-pay | Admitting: *Deleted

## 2018-02-09 ENCOUNTER — Encounter (HOSPITAL_COMMUNITY)
Admission: RE | Admit: 2018-02-09 | Discharge: 2018-02-09 | Disposition: A | Discharge status: Home / Self Care | Payer: Medicare Other | Source: Ambulatory Visit | Attending: Urology | Admitting: Urology

## 2018-02-09 ENCOUNTER — Other Ambulatory Visit: Payer: Self-pay | Admitting: Urology

## 2018-02-09 ENCOUNTER — Other Ambulatory Visit: Payer: Self-pay

## 2018-02-09 DIAGNOSIS — I110 Hypertensive heart disease with heart failure: Secondary | ICD-10-CM | POA: Diagnosis not present

## 2018-02-09 DIAGNOSIS — I4891 Unspecified atrial fibrillation: Secondary | ICD-10-CM | POA: Diagnosis not present

## 2018-02-09 DIAGNOSIS — E119 Type 2 diabetes mellitus without complications: Secondary | ICD-10-CM | POA: Diagnosis not present

## 2018-02-09 DIAGNOSIS — I509 Heart failure, unspecified: Secondary | ICD-10-CM | POA: Diagnosis not present

## 2018-02-09 DIAGNOSIS — Z7984 Long term (current) use of oral hypoglycemic drugs: Secondary | ICD-10-CM | POA: Diagnosis not present

## 2018-02-09 DIAGNOSIS — Z79899 Other long term (current) drug therapy: Secondary | ICD-10-CM | POA: Diagnosis not present

## 2018-02-09 DIAGNOSIS — C652 Malignant neoplasm of left renal pelvis: Secondary | ICD-10-CM | POA: Diagnosis not present

## 2018-02-09 DIAGNOSIS — Z01812 Encounter for preprocedural laboratory examination: Secondary | ICD-10-CM | POA: Diagnosis not present

## 2018-02-09 DIAGNOSIS — E7800 Pure hypercholesterolemia, unspecified: Secondary | ICD-10-CM | POA: Diagnosis not present

## 2018-02-09 DIAGNOSIS — E039 Hypothyroidism, unspecified: Secondary | ICD-10-CM | POA: Diagnosis not present

## 2018-02-09 DIAGNOSIS — C651 Malignant neoplasm of right renal pelvis: Secondary | ICD-10-CM | POA: Diagnosis not present

## 2018-02-09 DIAGNOSIS — J449 Chronic obstructive pulmonary disease, unspecified: Secondary | ICD-10-CM | POA: Diagnosis not present

## 2018-02-09 DIAGNOSIS — D49511 Neoplasm of unspecified behavior of right kidney: Secondary | ICD-10-CM | POA: Diagnosis present

## 2018-02-09 DIAGNOSIS — Z91041 Radiographic dye allergy status: Secondary | ICD-10-CM | POA: Diagnosis not present

## 2018-02-09 DIAGNOSIS — K219 Gastro-esophageal reflux disease without esophagitis: Secondary | ICD-10-CM | POA: Diagnosis not present

## 2018-02-09 DIAGNOSIS — Z885 Allergy status to narcotic agent status: Secondary | ICD-10-CM | POA: Diagnosis not present

## 2018-02-09 LAB — CBC
HEMATOCRIT: 37 % (ref 36.0–46.0)
Hemoglobin: 12 g/dL (ref 12.0–15.0)
MCH: 31.3 pg (ref 26.0–34.0)
MCHC: 32.4 g/dL (ref 30.0–36.0)
MCV: 96.4 fL (ref 78.0–100.0)
PLATELETS: 285 10*3/uL (ref 150–400)
RBC: 3.84 MIL/uL — ABNORMAL LOW (ref 3.87–5.11)
RDW: 14.3 % (ref 11.5–15.5)
WBC: 9.8 10*3/uL (ref 4.0–10.5)

## 2018-02-09 LAB — BASIC METABOLIC PANEL
Anion gap: 7 (ref 5–15)
BUN: 17 mg/dL (ref 6–20)
CALCIUM: 8.8 mg/dL — AB (ref 8.9–10.3)
CO2: 31 mmol/L (ref 22–32)
CREATININE: 0.91 mg/dL (ref 0.44–1.00)
Chloride: 104 mmol/L (ref 101–111)
GFR calc Af Amer: >60 mL/min (ref >60)
GFR, EST NON AFRICAN AMERICAN: 56 mL/min — AB (ref >60)
GLUCOSE: 187 mg/dL — AB (ref 65–99)
POTASSIUM: 3.3 mmol/L — AB (ref 3.5–5.1)
SODIUM: 142 mmol/L (ref 135–145)

## 2018-02-09 NOTE — Patient Instructions (Signed)
RUPA LAGAN  02/09/2018   Your procedure is scheduled on: 02/10/2018   Report to [facility] Main  Entrance  Report to admitting at  [place] AM    Call this number if you have problems the morning of surgery [phone]   Remember: Do not eat food or drink liquids :After Midnight.     Take these medicines the morning of surgery with A SIP OF WATER: Cardizem, Synthroid, Toprol, Zantac if needed  DO NOT TAKE ANY DIABETIC MEDICATIONS DAY OF YOUR SURGERY                               You may not have any metal on your body including hair pins and              piercings  Do not wear jewelry, make-up, lotions, powders or perfumes, deodorant             Do not wear nail polish.  Do not shave  48 hours prior to surgery.                Do not bring valuables to the hospital. [place].  Contacts, dentures or bridgework may not be worn into surgery.      Patients discharged the day of surgery will not be allowed to drive home.  Name and phone number of your driver:                Please read over the following fact sheets you were given: _____________________________________________________________________             [facility] - Preparing for Surgery Before surgery, you can play an important role.  Because skin is not sterile, your skin needs to be as free of germs as possible.  You can reduce the number of germs on your skin by washing with CHG (chlorahexidine gluconate) soap before surgery.  CHG is an antiseptic cleaner which kills germs and bonds with the skin to continue killing germs even after washing. Please DO NOT use if you have an allergy to CHG or antibacterial soaps.  If your skin becomes reddened/irritated stop using the CHG and inform your nurse when you arrive at Short Stay. Do not shave (including legs and underarms) for at least 48 hours prior to the first CHG shower.  You may shave your  face/neck. Please follow these instructions carefully:  1.  Shower with CHG Soap the night before surgery and the  morning of Surgery.  2.  If you choose to wash your hair, wash your hair first as usual with your  normal  shampoo.  3.  After you shampoo, rinse your hair and body thoroughly to remove the  shampoo.                           4.  Use CHG as you would any other liquid soap.  You can apply chg directly  to the skin and wash                       Gently with a scrungie or clean washcloth.  5.  Apply the CHG Soap to your body ONLY FROM  THE NECK DOWN.   Do not use on face/ open                           Wound or open sores. Avoid contact with eyes, ears mouth and genitals (private parts).                       Wash face,  Genitals (private parts) with your normal soap.             6.  Wash thoroughly, paying special attention to the area where your surgery  will be performed.  7.  Thoroughly rinse your body with warm water from the neck down.  8.  DO NOT shower/wash with your normal soap after using and rinsing off  the CHG Soap.                9.  Pat yourself dry with a clean towel.            10.  Wear clean pajamas.            11.  Place clean sheets on your bed the night of your first shower and do not  sleep with pets. Day of Surgery : Do not apply any lotions/deodorants the morning of surgery.  Please wear clean clothes to the hospital/surgery center.  FAILURE TO FOLLOW THESE INSTRUCTIONS MAY RESULT IN THE CANCELLATION OF YOUR SURGERY PATIENT SIGNATURE_________________________________  NURSE SIGNATURE__________________________________  ________________________________________________________________________

## 2018-02-09 NOTE — Progress Notes (Addendum)
01/31/2018-cbc,diff,cmp- epic  EKG-10/27/17-epic CXR-1V- 12/06/17-epic  ECHO-2017-epic  hgba1c-01/19/18-epic-6.3 08/20/17-LOV-Dr Rolan Lipa

## 2018-02-09 NOTE — Progress Notes (Signed)
BMP done 02/09/18 faxed via epic to Dr Louis Meckel.

## 2018-02-10 ENCOUNTER — Ambulatory Visit (HOSPITAL_COMMUNITY): Payer: Medicare Other | Admitting: Certified Registered Nurse Anesthetist

## 2018-02-10 ENCOUNTER — Observation Stay (HOSPITAL_COMMUNITY)
Admission: RE | Admit: 2018-02-10 | Discharge: 2018-02-11 | Disposition: A | Discharge status: Home / Self Care | Payer: Medicare Other | Source: Ambulatory Visit | Attending: Urology | Admitting: Urology

## 2018-02-10 ENCOUNTER — Encounter (HOSPITAL_COMMUNITY)
Admission: RE | Disposition: A | Discharge status: Home / Self Care | Payer: Self-pay | Source: Ambulatory Visit | Attending: Urology

## 2018-02-10 ENCOUNTER — Encounter (HOSPITAL_COMMUNITY): Payer: Self-pay | Admitting: Certified Registered Nurse Anesthetist

## 2018-02-10 ENCOUNTER — Ambulatory Visit (HOSPITAL_COMMUNITY): Payer: Medicare Other

## 2018-02-10 ENCOUNTER — Other Ambulatory Visit: Payer: Self-pay

## 2018-02-10 DIAGNOSIS — J449 Chronic obstructive pulmonary disease, unspecified: Secondary | ICD-10-CM | POA: Diagnosis not present

## 2018-02-10 DIAGNOSIS — D49511 Neoplasm of unspecified behavior of right kidney: Secondary | ICD-10-CM | POA: Diagnosis not present

## 2018-02-10 DIAGNOSIS — I509 Heart failure, unspecified: Secondary | ICD-10-CM | POA: Insufficient documentation

## 2018-02-10 DIAGNOSIS — K219 Gastro-esophageal reflux disease without esophagitis: Secondary | ICD-10-CM | POA: Diagnosis not present

## 2018-02-10 DIAGNOSIS — E7800 Pure hypercholesterolemia, unspecified: Secondary | ICD-10-CM | POA: Insufficient documentation

## 2018-02-10 DIAGNOSIS — Z91041 Radiographic dye allergy status: Secondary | ICD-10-CM | POA: Insufficient documentation

## 2018-02-10 DIAGNOSIS — I4891 Unspecified atrial fibrillation: Secondary | ICD-10-CM | POA: Insufficient documentation

## 2018-02-10 DIAGNOSIS — Z79899 Other long term (current) drug therapy: Secondary | ICD-10-CM | POA: Diagnosis not present

## 2018-02-10 DIAGNOSIS — E119 Type 2 diabetes mellitus without complications: Secondary | ICD-10-CM | POA: Diagnosis not present

## 2018-02-10 DIAGNOSIS — R52 Pain, unspecified: Secondary | ICD-10-CM | POA: Diagnosis present

## 2018-02-10 DIAGNOSIS — D49512 Neoplasm of unspecified behavior of left kidney: Secondary | ICD-10-CM | POA: Diagnosis not present

## 2018-02-10 DIAGNOSIS — Z01812 Encounter for preprocedural laboratory examination: Secondary | ICD-10-CM | POA: Insufficient documentation

## 2018-02-10 DIAGNOSIS — Z885 Allergy status to narcotic agent status: Secondary | ICD-10-CM | POA: Insufficient documentation

## 2018-02-10 DIAGNOSIS — C652 Malignant neoplasm of left renal pelvis: Secondary | ICD-10-CM | POA: Diagnosis not present

## 2018-02-10 DIAGNOSIS — C669 Malignant neoplasm of unspecified ureter: Secondary | ICD-10-CM

## 2018-02-10 DIAGNOSIS — Z7984 Long term (current) use of oral hypoglycemic drugs: Secondary | ICD-10-CM | POA: Diagnosis not present

## 2018-02-10 DIAGNOSIS — C651 Malignant neoplasm of right renal pelvis: Secondary | ICD-10-CM | POA: Diagnosis not present

## 2018-02-10 DIAGNOSIS — I110 Hypertensive heart disease with heart failure: Secondary | ICD-10-CM | POA: Insufficient documentation

## 2018-02-10 DIAGNOSIS — D4122 Neoplasm of uncertain behavior of left ureter: Secondary | ICD-10-CM | POA: Diagnosis not present

## 2018-02-10 DIAGNOSIS — E039 Hypothyroidism, unspecified: Secondary | ICD-10-CM | POA: Insufficient documentation

## 2018-02-10 DIAGNOSIS — D4121 Neoplasm of uncertain behavior of right ureter: Secondary | ICD-10-CM | POA: Diagnosis not present

## 2018-02-10 HISTORY — DX: Other specified postprocedural states: Z98.890

## 2018-02-10 HISTORY — DX: Other specified postprocedural states: R11.2

## 2018-02-10 HISTORY — PX: CYSTOSCOPY WITH URETEROSCOPY AND STENT PLACEMENT: SHX6377

## 2018-02-10 LAB — GLUCOSE, CAPILLARY: GLUCOSE-CAPILLARY: 117 mg/dL — AB (ref 65–99)

## 2018-02-10 SURGERY — CYSTOURETEROSCOPY, WITH STENT INSERTION
Anesthesia: General | Laterality: Bilateral

## 2018-02-10 MED ORDER — HYDROMORPHONE HCL 1 MG/ML IJ SOLN
INTRAMUSCULAR | Status: AC
  Administered 2018-02-10: 0.25 mg via INTRAVENOUS
  Filled 2018-02-10: qty 1

## 2018-02-10 MED ORDER — APIXABAN 5 MG PO TABS: 5.0000 mg | ORAL_TABLET | Freq: Two times a day (BID) | ORAL | Status: DC

## 2018-02-10 MED ORDER — LIDOCAINE 2% (20 MG/ML) 5 ML SYRINGE
INTRAMUSCULAR | Status: DC | PRN
  Administered 2018-02-10: 100 mg via INTRAVENOUS

## 2018-02-10 MED ORDER — FENTANYL CITRATE (PF) 250 MCG/5ML IJ SOLN
INTRAMUSCULAR | Status: AC
  Filled 2018-02-10: qty 5

## 2018-02-10 MED ORDER — IOHEXOL 300 MG/ML  SOLN
INTRAMUSCULAR | Status: DC | PRN
  Administered 2018-02-10: 15 mL via URETHRAL

## 2018-02-10 MED ORDER — MEPERIDINE HCL 50 MG/ML IJ SOLN: 6.2500 mg | INTRAMUSCULAR | Status: DC | PRN

## 2018-02-10 MED ORDER — ONDANSETRON HCL 4 MG/2ML IJ SOLN
INTRAMUSCULAR | Status: AC
  Filled 2018-02-10: qty 2

## 2018-02-10 MED ORDER — PROPOFOL 10 MG/ML IV BOLUS
INTRAVENOUS | Status: DC | PRN
  Administered 2018-02-10: 150 mg via INTRAVENOUS
  Administered 2018-02-10: 20 mg via INTRAVENOUS

## 2018-02-10 MED ORDER — ONDANSETRON HCL 4 MG/2ML IJ SOLN
4.0000 mg | Freq: Once | INTRAMUSCULAR | Status: AC | PRN
  Administered 2018-02-10: 4 mg via INTRAVENOUS

## 2018-02-10 MED ORDER — CIPROFLOXACIN IN D5W 400 MG/200ML IV SOLN
400.0000 mg | INTRAVENOUS | Status: AC
  Administered 2018-02-10: 400 mg via INTRAVENOUS
  Filled 2018-02-10: qty 200

## 2018-02-10 MED ORDER — SODIUM CHLORIDE 0.9 % IV SOLN
INTRAVENOUS | Status: DC
  Administered 2018-02-10: 15:00:00 via INTRAVENOUS

## 2018-02-10 MED ORDER — KETOROLAC TROMETHAMINE 15 MG/ML IJ SOLN
INTRAMUSCULAR | Status: AC
  Administered 2018-02-10: 15 mg via INTRAVENOUS
  Filled 2018-02-10: qty 1

## 2018-02-10 MED ORDER — FENTANYL CITRATE (PF) 100 MCG/2ML IJ SOLN
INTRAMUSCULAR | Status: DC | PRN
  Administered 2018-02-10 (×2): 50 ug via INTRAVENOUS

## 2018-02-10 MED ORDER — ACETAMINOPHEN 325 MG PO TABS: 650.0000 mg | ORAL_TABLET | Freq: Four times a day (QID) | ORAL | Status: DC | PRN

## 2018-02-10 MED ORDER — ONDANSETRON HCL 4 MG/2ML IJ SOLN
4.0000 mg | Freq: Once | INTRAMUSCULAR | Status: AC
  Administered 2018-02-10: 4 mg via INTRAVENOUS

## 2018-02-10 MED ORDER — DIPHENHYDRAMINE HCL 50 MG/ML IJ SOLN
INTRAMUSCULAR | Status: AC
  Filled 2018-02-10: qty 2

## 2018-02-10 MED ORDER — DIPHENHYDRAMINE HCL 50 MG/ML IJ SOLN
INTRAMUSCULAR | Status: DC | PRN
  Administered 2018-02-10: 6.25 mg via INTRAVENOUS

## 2018-02-10 MED ORDER — DEXAMETHASONE SODIUM PHOSPHATE 10 MG/ML IJ SOLN
INTRAMUSCULAR | Status: AC
  Filled 2018-02-10: qty 1

## 2018-02-10 MED ORDER — OXYCODONE HCL 5 MG PO TABS
5.0000 mg | ORAL_TABLET | Freq: Four times a day (QID) | ORAL | Status: DC | PRN
  Administered 2018-02-10 (×2): 5 mg via ORAL
  Filled 2018-02-10: qty 2
  Filled 2018-02-10: qty 1

## 2018-02-10 MED ORDER — SODIUM CHLORIDE 0.9 % IR SOLN
Status: DC | PRN
  Administered 2018-02-10: 6000 mL

## 2018-02-10 MED ORDER — ONDANSETRON HCL 4 MG/2ML IJ SOLN
INTRAMUSCULAR | Status: DC | PRN
  Administered 2018-02-10: 4 mg via INTRAVENOUS

## 2018-02-10 MED ORDER — MORPHINE SULFATE (PF) 4 MG/ML IV SOLN: 2.0000 mg | INTRAVENOUS | Status: DC | PRN

## 2018-02-10 MED ORDER — LACTATED RINGERS IV SOLN
INTRAVENOUS | Status: DC
  Administered 2018-02-10 (×2): via INTRAVENOUS

## 2018-02-10 MED ORDER — DEXAMETHASONE SODIUM PHOSPHATE 10 MG/ML IJ SOLN
INTRAMUSCULAR | Status: DC | PRN
  Administered 2018-02-10: 5 mg via INTRAVENOUS

## 2018-02-10 MED ORDER — HYDROMORPHONE HCL 1 MG/ML IJ SOLN
0.2500 mg | INTRAMUSCULAR | Status: DC | PRN
  Administered 2018-02-10: 0.25 mg via INTRAVENOUS

## 2018-02-10 MED ORDER — KETOROLAC TROMETHAMINE 15 MG/ML IJ SOLN
15.0000 mg | Freq: Once | INTRAMUSCULAR | Status: AC
  Administered 2018-02-10: 15 mg via INTRAVENOUS

## 2018-02-10 MED ORDER — PROPOFOL 10 MG/ML IV BOLUS
INTRAVENOUS | Status: AC
  Filled 2018-02-10: qty 20

## 2018-02-10 SURGICAL SUPPLY — 23 items
BAG URO CATCHER STRL LF (MISCELLANEOUS) ×3 IMPLANT
BASKET ZERO TIP NITINOL 2.4FR (BASKET) ×2 IMPLANT
BSKT STON RTRVL ZERO TP 2.4FR (BASKET) ×1
CATH URET 5FR 28IN OPEN ENDED (CATHETERS) ×3 IMPLANT
CLOTH BEACON ORANGE TIMEOUT ST (SAFETY) ×3 IMPLANT
COVER FOOTSWITCH UNIV (MISCELLANEOUS) ×2 IMPLANT
COVER SURGICAL LIGHT HANDLE (MISCELLANEOUS) ×3 IMPLANT
EXTRACTOR STONE 1.7FRX115CM (UROLOGICAL SUPPLIES) IMPLANT
FIBER LASER TRAC TIP (UROLOGICAL SUPPLIES) ×2 IMPLANT
FORCEPS BIOP 2.4F 115CM BACKLD (INSTRUMENTS) ×2 IMPLANT
GLOVE BIOGEL M STRL SZ7.5 (GLOVE) ×3 IMPLANT
GOWN STRL REUS W/TWL XL LVL3 (GOWN DISPOSABLE) ×3 IMPLANT
GUIDEWIRE ANG ZIPWIRE 038X150 (WIRE) IMPLANT
GUIDEWIRE STR DUAL SENSOR (WIRE) ×3 IMPLANT
MANIFOLD NEPTUNE II (INSTRUMENTS) ×3 IMPLANT
PACK CYSTO (CUSTOM PROCEDURE TRAY) ×3 IMPLANT
SHEATH URETERAL 12FRX28CM (UROLOGICAL SUPPLIES) IMPLANT
SHEATH URETERAL 12FRX35CM (MISCELLANEOUS) ×2 IMPLANT
STENT CONTOUR 6FRX24X.038 (STENTS) ×4 IMPLANT
TUBING CONNECTING 10 (TUBING) ×2 IMPLANT
TUBING CONNECTING 10' (TUBING) ×1
TUBING UROLOGY SET (TUBING) ×3 IMPLANT
WIRE COONS/BENSON .038X145CM (WIRE) IMPLANT

## 2018-02-10 NOTE — Progress Notes (Signed)
Dr Dayle Points in at bedside for Dr Louis Meckel to evaluate patient. Plan is for patient to be admitted overnight instead of discharge to home today.

## 2018-02-10 NOTE — Progress Notes (Signed)
Dr Louis Meckel returning page, explained patient states she's in too much pain to dc home and inability to wean off supplemental oxygen to room air. MD said resident would be by to evaluate patient.

## 2018-02-10 NOTE — Anesthesia Preprocedure Evaluation (Signed)
Anesthesia Evaluation  Patient identified by MRN, date of birth, ID band Patient awake    Reviewed: Allergy & Precautions, NPO status , Patient's Chart, lab work & pertinent test results  History of Anesthesia Complications (+) PONV  Airway Mallampati: I  TM Distance: >3 FB Neck ROM: Full    Dental   Pulmonary COPD,    Pulmonary exam normal        Cardiovascular hypertension, Pt. on medications Normal cardiovascular exam+ dysrhythmias Atrial Fibrillation      Neuro/Psych    GI/Hepatic GERD  Medicated and Controlled,  Endo/Other  diabetes, Type 2, Oral Hypoglycemic Agents  Renal/GU      Musculoskeletal   Abdominal   Peds  Hematology   Anesthesia Other Findings   Reproductive/Obstetrics                             Anesthesia Physical Anesthesia Plan  ASA: III  Anesthesia Plan: General   Post-op Pain Management:    Induction:   PONV Risk Score and Plan: 4 or greater and Ondansetron and Treatment may vary due to age or medical condition  Airway Management Planned: LMA  Additional Equipment:   Intra-op Plan:   Post-operative Plan: Extubation in OR  Informed Consent: I have reviewed the patients History and Physical, chart, labs and discussed the procedure including the risks, benefits and alternatives for the proposed anesthesia with the patient or authorized representative who has indicated his/her understanding and acceptance.     Plan Discussed with: CRNA and Surgeon  Anesthesia Plan Comments:         Anesthesia Quick Evaluation

## 2018-02-10 NOTE — Transfer of Care (Signed)
Immediate Anesthesia Transfer of Care Note  Patient: [name] Case  Procedure(s) Performed: BILATERAL URETEROSCOPY AND TUMOR EXCISION BILATERAL STENT EXCHANGE (Bilateral )  Patient Location: PACU  Anesthesia Type:General  Level of Consciousness: sedated, patient cooperative and responds to stimulation  Airway & Oxygen Therapy: Patient Spontanous Breathing and Patient connected to face mask oxygen  Post-op Assessment: Report given to RN and Post -op Vital signs reviewed and stable  Post vital signs: Reviewed and stable  Last Vitals:  Vitals Value Taken Time  BP 133/75 02/10/2018 10:35 AM  Temp    Pulse 107 02/10/2018 10:37 AM  Resp 16 02/10/2018 10:37 AM  SpO2 97 % 02/10/2018 10:37 AM  Vitals shown include unvalidated device data.  Last Pain:  Vitals:   02/10/18 0709  TempSrc: Oral      Patients Stated Pain Goal: 3 (78/24/23 5361)  Complications: No apparent anesthesia complications

## 2018-02-10 NOTE — Anesthesia Postprocedure Evaluation (Signed)
Anesthesia Post Note  Patient: [name] Case  Procedure(s) Performed: BILATERAL URETEROSCOPY AND TUMOR EXCISION BILATERAL STENT EXCHANGE (Bilateral )     Patient location during evaluation: PACU Anesthesia Type: General Level of consciousness: awake and alert Pain management: pain level controlled Vital Signs Assessment: post-procedure vital signs reviewed and stable Respiratory status: spontaneous breathing, nonlabored ventilation, respiratory function stable and patient connected to nasal cannula oxygen Cardiovascular status: blood pressure returned to baseline and stable Postop Assessment: no apparent nausea or vomiting Anesthetic complications: no    Last Vitals:  Vitals:   02/10/18 1500 02/10/18 1550  BP: (!) 141/75 131/70  Pulse: 80 87  Resp: 17 16  Temp:  37.1 C  SpO2: 96% 95%    Last Pain:  Vitals:   02/10/18 1600  TempSrc:   PainSc: 2                  Stormi Vandevelde DAVID

## 2018-02-10 NOTE — Progress Notes (Signed)
Patient is admitted to 55. Patient is AOX4. Family is at the bedside

## 2018-02-10 NOTE — Interval H&P Note (Signed)
History and Physical Interval Note: Patient presents today for second stage bilateral ureteroscopy and tumor excision.  We discussed the surgery again in detail, the patient understands the process of removing these tumors take several surgeries.  Her plan today is to proceed with laser tumor ablation in the right and left renal pelvis.  Risk and benefits were discussed with the patient and she agreed to proceed.  There are no changes to her history and physical as stated above.  02/10/2018 8:28 AM  [name] Case  has presented today for surgery, with the diagnosis of BILATERAL UPPER TRACT TRANSITIONAL CELL CARCINOMA  The various methods of treatment have been discussed with the patient and family. After consideration of risks, benefits and other options for treatment, the patient has consented to  Procedure(s): BILATERAL URETEROSCOPY AND TUMOR EXCISION BILATERAL STENT EXCHANGE (Bilateral) as a surgical intervention .  The patient's history has been reviewed, patient examined, no change in status, stable for surgery.  I have reviewed the patient's chart and labs.  Questions were answered to the patient's satisfaction.     Louis Meckel W

## 2018-02-10 NOTE — Op Note (Signed)
Preoperative diagnosis: Bilateral 1. Renal pelvis tumors  Postoperative diagnosis:  1. Same  Procedure: 1. Bilateral ureteroscopy with tumor resection, laser ablation 2. Bilateral retrograde pyelogram with interpretation bilateral ureteral stent exchange 3.   Surgeon: Ardis Hughs, MD  Anesthesia: General  Complications: None  Intraoperative findings:  #1: The left retrograde pyelogram was performed with 10 cc of Omnipaque contrast through the 5 [place] open-ended ureteral catheter and demonstrated a normal caliber ureter with no appreciable filling defects.  The left retrograde pyelogram was performed in a similar fashion with delayed filling of the right upper pole calyx but otherwise no hydroureteronephrosis or appreciable filling defects. #2: The left renal pelvis tumor base remained and was able to take a large bit of this with the pickups he forceps.  I then ablated the remainder of the tumor base with a 200 m fiber. #3: The right upper pole calyx tumors were removed with a basket in combination with the biopsy forceps.  The remainder there was unable to basket I ablated.  This involves nearly the entire urothelium within the upper pole calyx.  EBL: Minimal  Specimens: #1: Left renal pelvis tumor #2: Right upper pole calyx tumors  Indication: [name] Case is a 84 y.o. patient with history of bilateral upper tract TCC who presents today for staged ureteroscopy and tumor resection.  After reviewing the management options for treatment, he elected to proceed with the above surgical procedure(s). We have discussed the potential benefits and risks of the procedure, side effects of the proposed treatment, the likelihood of the patient achieving the goals of the procedure, and any potential problems that might occur during the procedure or recuperation. Informed consent has been obtained.  Description of procedure:  The patient was taken to the operating room and general  anesthesia was induced.  The patient was placed in the dorsal lithotomy position, prepped and draped in the usual sterile fashion, and preoperative antibiotics were administered. A preoperative time-out was performed.   21 French 30 degrees cystoscope was gently passed to the patient's urethra and into the bladder under visual guidance.  I think grasper was used to grasp the stent emanating from the patient's left ureteral orifice pulled to the urethral meatus.  A 0.38 sensor wire was then advanced up through the stent and into the left renal pelvis.  The wire was then exchanged for a 5 [place] open-ended catheter and retrograde pyelogram was performed with the above findings.  I then replaced the wire and remove the catheter over the wire.  I subsequently advanced a 12/14 [place] x35 cm ureteral access sheath under fluoroscopic guidance up into the proximal left ureter.  Removing the inner portion and the wire I inserted the flexible ureteroscope.  Pyeloscopy demonstrated no other significant findings other than the small tumor base that was the medial aspect of the renal pelvis.  I subsequently used the was able to remove a large portion of this the big up to the forceps and lesion.  I subsequently ablated the tumor base with a 200 m fiber.  Instillation was limited once the bleeding began but the second toe most of the tumor had been completely ablated.   I subsequently repassed the sensor wire through the scope and backed the scope out under visual guidance removing the access sheath.  Under fluoroscopic guidance I advanced a 6 French x24 cm stent over the wire into the  left upper pole.  Once the stent was noted to be in the upper pole  I advanced it to the urethra meatus and with gentle pressure on stent remove the wire.  The stent was noted to spring into the bladder and confirmed under fluoroscopy.  I then reinserted the 21 French 30 degrees cystoscope in the patient's bladder and drained the bladder.  I  then grasped the stent emanating from the patient's right ureteral orifice and pulled to the urethral meatus.  Using a 0.38 sensor wire I advanced it through the stent and and to the right renal pelvis removing the stent over the wire.  I then exchanged this wire for a 5 [place] catheter performed retrograde pyelogram with the above findings.  I then exchanged the wire the open-ended catheter and advanced a 12/14 [place] x35 cm ureteral access sheath under fluoroscopic guidance into the proximal right ureter.  Removing the wire and the inner portion of the sheath I advanced a flexible ureteroscope into the right renal pelvis.  Fluoroscopy then ensued and the tumor burden was noted to be significantly less in the right upper pole calyx.  There are no other significant abnormalities.  I then used the biopsy in combination with a 0 tipped basket to snare a large tumor that was on the posterior aspect of the calyx.  I then used the 200 m laser fiber and ablated the remainder of the tumors within the calyx.  This involved the majority of the urothelium in this area.  I then aspirated and irrigated out this area to ensure that there were no residual tumors or tissue floating around.  I then advanced a 0.38 sensor wire into the upper pole calyx and slowly backed the scope out over the wire removing the sheath simultaneously.  I then advanced the 2 4 cm x 6 French double-J ureteral stent into the right ureter under fluoroscopic guidance and advanced it up into the upper pole under fluoroscopic guidance.  Once the stent was noted to be in the upper pole remove the wire after advancing the stent up to the urethral meatus.  I then pushed the tip of the stent and with the beak of the scope emptying the bladder simultaneously.  Fluoroscopy did confirm a nice curl in the bladder as well as in the kidney on both sides.  The patient was subsequently awoken and returned to PACU in stable condition.  Ardis Hughs,  M.D.

## 2018-02-10 NOTE — Discharge Instructions (Signed)
DISCHARGE INSTRUCTIONS FOR KIDNEY SURGERY URETERAL STENT   MEDICATIONS:  1.  Resume all your other meds from home - except do not take any extra narcotic pain meds that you may have at home.  2. Tramadol is for moderate/severe pain, otherwise taking upto 1000 mg every 6 hours of plainTylenol will help treat your pain.     ACTIVITY:  1. No strenuous activity x 1week  2. No driving while on narcotic pain medications  3. Drink plenty of water  4. Continue to walk at home - you can still get blood clots when you are at home, so keep active, but don't over do it.  5. May return to work/school tomorrow or when you feel ready   BATHING:  1. You can shower and we recommend daily showers  2. You have a string coming from your urethra: The stent string is attached to your ureteral stent. Do not pull on this.   SIGNS/SYMPTOMS TO CALL:  Please call us if you have a fever greater than 101.5, uncontrolled nausea/vomiting, uncontrolled pain, dizziness, unable to urinate, bloody urine, chest pain, shortness of breath, leg swelling, leg pain, redness around wound, drainage from wound, or any other concerns or questions.   You can reach [place] at [phone].   FOLLOW-UP:  1. You have an appointment for stent removal in 2 week.

## 2018-02-10 NOTE — Anesthesia Procedure Notes (Signed)
Procedure Name: LMA Insertion Performed by: Elisandra Deshmukh J, CRNA Pre-anesthesia Checklist: Patient identified, Emergency Drugs available, Suction available, Patient being monitored and Timeout performed Patient Re-evaluated:Patient Re-evaluated prior to induction Oxygen Delivery Method: Circle system utilized Preoxygenation: Pre-oxygenation with 100% oxygen Induction Type: IV induction Ventilation: Mask ventilation without difficulty LMA: LMA inserted LMA Size: 4.0 Number of attempts: 1 Placement Confirmation: positive ETCO2,  CO2 detector and breath sounds checked- equal and bilateral Tube secured with: Tape Dental Injury: Teeth and Oropharynx as per pre-operative assessment        

## 2018-02-11 ENCOUNTER — Other Ambulatory Visit: Payer: Self-pay | Admitting: Cardiology

## 2018-02-11 ENCOUNTER — Telehealth: Payer: Self-pay | Admitting: *Deleted

## 2018-02-11 DIAGNOSIS — E7800 Pure hypercholesterolemia, unspecified: Secondary | ICD-10-CM | POA: Diagnosis not present

## 2018-02-11 DIAGNOSIS — E039 Hypothyroidism, unspecified: Secondary | ICD-10-CM | POA: Diagnosis not present

## 2018-02-11 DIAGNOSIS — E119 Type 2 diabetes mellitus without complications: Secondary | ICD-10-CM | POA: Diagnosis not present

## 2018-02-11 DIAGNOSIS — Z885 Allergy status to narcotic agent status: Secondary | ICD-10-CM | POA: Diagnosis not present

## 2018-02-11 DIAGNOSIS — J449 Chronic obstructive pulmonary disease, unspecified: Secondary | ICD-10-CM | POA: Diagnosis not present

## 2018-02-11 DIAGNOSIS — I110 Hypertensive heart disease with heart failure: Secondary | ICD-10-CM | POA: Diagnosis not present

## 2018-02-11 DIAGNOSIS — Z91041 Radiographic dye allergy status: Secondary | ICD-10-CM | POA: Diagnosis not present

## 2018-02-11 DIAGNOSIS — Z79899 Other long term (current) drug therapy: Secondary | ICD-10-CM | POA: Diagnosis not present

## 2018-02-11 DIAGNOSIS — Z7984 Long term (current) use of oral hypoglycemic drugs: Secondary | ICD-10-CM | POA: Diagnosis not present

## 2018-02-11 DIAGNOSIS — Z01812 Encounter for preprocedural laboratory examination: Secondary | ICD-10-CM | POA: Diagnosis not present

## 2018-02-11 DIAGNOSIS — C651 Malignant neoplasm of right renal pelvis: Secondary | ICD-10-CM | POA: Diagnosis not present

## 2018-02-11 DIAGNOSIS — I509 Heart failure, unspecified: Secondary | ICD-10-CM | POA: Diagnosis not present

## 2018-02-11 DIAGNOSIS — I4891 Unspecified atrial fibrillation: Secondary | ICD-10-CM | POA: Diagnosis not present

## 2018-02-11 DIAGNOSIS — C652 Malignant neoplasm of left renal pelvis: Secondary | ICD-10-CM | POA: Diagnosis not present

## 2018-02-11 DIAGNOSIS — K219 Gastro-esophageal reflux disease without esophagitis: Secondary | ICD-10-CM | POA: Diagnosis not present

## 2018-02-11 MED ORDER — ONDANSETRON HCL 4 MG PO TABS: 4.0000 mg | ORAL_TABLET | Freq: Three times a day (TID) | ORAL | 0 refills | Status: DC | PRN

## 2018-02-11 NOTE — Telephone Encounter (Signed)
Pt was admitted 02/10/18 for bilateral ureteral tumors, transitional cell carcinoma of the ureters. Pt d/c 02/11/18 and will follow-up w/urologist Ardis Hughs, MD On 02/23/2018. Does not need TCM appt w/PCP.Marland KitchenJohny Chess

## 2018-02-11 NOTE — Discharge Summary (Signed)
Date of admission: 02/10/2018  Date of discharge: 02/11/2018  Admission diagnosis: Bilateral ureteral tumors, transitional cell carcinoma of the ureters  Discharge diagnosis: Same  Secondary diagnoses:  Patient Active Problem List   Diagnosis Date Noted  . Uncontrolled pain 02/10/2018  . Pyohydronephrosis 12/04/2017  . Lichen planus 16/60/6301  . Acute respiratory failure with hypoxia ([place])   . Obesity hypoventilation syndrome ([place])   . Chronic CHF (congestive heart failure) ([place]) 01/09/2016  . Atrial fibrillation with RVR ([place]) 01/09/2016  . History of snoring 05/20/2015  . Chronic respiratory failure with hypercapnia ([place])   . Type 2 diabetes mellitus without complication ([place])   . Atrial fibrillation ([place]) 01/06/2015  . Hypothyroidism 03/09/2007  . Hyperlipidemia 03/09/2007  . Morbid obesity ([place]) 03/09/2007  . Essential hypertension 03/09/2007  . ALLERGIC RHINITIS 03/09/2007  . Asthma 03/09/2007  . GERD 03/09/2007  . INCONTINENCE 03/09/2007    Procedures performed: Procedure(s): BILATERAL URETEROSCOPY AND TUMOR EXCISION BILATERAL STENT EXCHANGE  History and Physical: For full details, please see admission history and physical. Briefly, [name] Case is a 84 y.o. year old patient with bilateral upper tract transitional cell carcinoma tumors.  The patient presented for second stage bilateral ureteroscopy and tumor excision.  In the postop recovery area the patient was complaining of significant abdominal pain.  She was subsequently admitted for observation.[name]   Hospital Course: Patient tolerated the procedure well.  She was then transferred to the floor after an uneventful PACU stay.  Her hospital course was uncomplicated.  On POD#1 she had met discharge criteria: was eating a regular diet, was up and ambulating independently,  pain was well controlled, was voiding without a catheter, and was ready to for discharge.  PE: Vitals:   02/10/18 1500 02/10/18 1550 02/10/18 2136  02/11/18 0444  BP: (!) 141/75 131/70 114/87 (!) 106/56  Pulse: 80 87 96 80  Resp: 17 16 18 18   Temp:  98.7 F (37.1 C) 98.7 F (37.1 C) 97.7 F (36.5 C)  TempSrc:   Oral Oral  SpO2: 96% 95% 94% 97%  Weight:      Height:        Intake/Output Summary (Last 24 hours) at 02/11/2018 0743 Last data filed at 02/11/2018 0200 Gross per 24 hour  Intake 1865.83 ml  Output 50 ml  Net 1815.83 ml   NAD Abdomen is soft Non-labored breathing    Laboratory values:  Recent Labs    02/09/18 1206  WBC 9.8  HGB 12.0  HCT 37.0   Recent Labs    02/09/18 1206  NA 142  K 3.3*  CL 104  CO2 31  GLUCOSE 187*  BUN 17  CREATININE 0.91  CALCIUM 8.8*   No results for input(s): LABPT, INR in the last 72 hours. No results for input(s): LABURIN in the last 72 hours. Results for orders placed or performed during the hospital encounter of 12/03/17  Urine culture     Status: Abnormal   Collection Time: 12/04/17  2:43 AM  Result Value Ref Range Status   Specimen Description   Final    URINE, CLEAN CATCH Performed at [facility] [place] [address], [place], [place] [address]    Special Requests   Final    NONE Performed at [facility], [place] [address] [place], [place] [address]    Culture (A)  Final    <10,000 COLONIES/mL INSIGNIFICANT GROWTH Performed at [place] [address] [place], [place] [address]  Report Status 12/05/2017 FINAL  Final  MRSA PCR Screening     Status: None   Collection Time: 12/04/17 11:15 AM  Result Value Ref Range Status   MRSA by PCR NEGATIVE NEGATIVE Final    Comment:        The GeneXpert MRSA Assay (FDA approved for NASAL specimens only), is one component of a comprehensive MRSA colonization surveillance program. It is not intended to diagnose MRSA infection nor to guide or monitor treatment for MRSA infections. Performed at [facility], [place] [address], [place], [place]  [address]     Disposition: Home  Discharge instruction: The patient was instructed to be ambulatory but told to refrain from heavy lifting, strenuous activity, or driving.   Discharge medications:  Allergies as of 02/11/2018      Reactions   Codeine Hives, Rash   In cough syrup preparations      Medication List    TAKE these medications   acetaminophen 500 MG tablet Commonly known as:  TYLENOL Take 1,000 mg by mouth daily as needed for moderate pain or headache.   albuterol (2.5 MG/3ML) 0.083% nebulizer solution Commonly known as:  PROVENTIL Take 3 mLs (2.5 mg total) by nebulization every 6 (six) hours as needed for wheezing or shortness of breath.   apixaban 5 MG Tabs tablet Commonly known as:  ELIQUIS Take 1 tablet (5 mg total) by mouth 2 (two) times daily. Resume once the bleeding has stopped from the surgery for 2 days.   atorvastatin 20 MG tablet Commonly known as:  LIPITOR TAKE 1 TABLET BY MOUTH DAILY AT 6 PM   ciprofloxacin 500 MG tablet Commonly known as:  CIPRO Take 1 tablet (500 mg total) by mouth 2 (two) times daily.   diltiazem 180 MG 24 hr capsule Commonly known as:  CARDIZEM CD Take 1 capsule (180 mg total) by mouth daily.   furosemide 80 MG tablet Commonly known as:  LASIX TAKE 1/2 TABLET BY MOUTH DAILY   irbesartan 150 MG tablet Commonly known as:  AVAPRO Take 0.5 tablets (75 mg total) by mouth daily.   levothyroxine 100 MCG tablet Commonly known as:  SYNTHROID, LEVOTHROID Take 1 tablet (100 mcg total) by mouth daily. Needs annual visit with labs for further refills   metoprolol succinate 50 MG 24 hr tablet Commonly known as:  TOPROL-XL TAKE 1 TABLET BY MOUTH DAILY, TAKE WITH OR IMMEDIATELY FOLLOWING A MEAL   ondansetron 4 MG tablet Commonly known as:  ZOFRAN Take 1 tablet (4 mg total) by mouth every 8 (eight) hours as needed for nausea or vomiting.   ranitidine 150 MG tablet Commonly known as:  ZANTAC Take 150 mg by mouth daily as needed for  heartburn.     ASK your doctor about these medications   senna-docusate 8.6-50 MG tablet Commonly known as:  Senokot-S Take 2 tablets by mouth daily for 10 days. Ask about: Should I take this medication?       Followup:  Follow-up Information    Ardis Hughs, MD On 02/23/2018.   Specialty:  Urology Why:  Stent removal - 10:45am Contact information: [address] [place] [address] [phone]

## 2018-02-23 DIAGNOSIS — C652 Malignant neoplasm of left renal pelvis: Secondary | ICD-10-CM | POA: Diagnosis not present

## 2018-02-23 DIAGNOSIS — N3000 Acute cystitis without hematuria: Secondary | ICD-10-CM | POA: Diagnosis not present

## 2018-02-23 DIAGNOSIS — C651 Malignant neoplasm of right renal pelvis: Secondary | ICD-10-CM | POA: Diagnosis not present

## 2018-02-25 ENCOUNTER — Inpatient Hospital Stay (HOSPITAL_COMMUNITY)
Admission: EM | Admit: 2018-02-25 | Discharge: 2018-03-01 | DRG: 854 | Disposition: A | Discharge status: Home Health | Payer: Medicare Other | Attending: Internal Medicine | Admitting: Internal Medicine

## 2018-02-25 ENCOUNTER — Emergency Department (HOSPITAL_COMMUNITY): Payer: Medicare Other

## 2018-02-25 ENCOUNTER — Other Ambulatory Visit: Payer: Self-pay

## 2018-02-25 ENCOUNTER — Encounter (HOSPITAL_COMMUNITY): Payer: Self-pay | Admitting: Emergency Medicine

## 2018-02-25 DIAGNOSIS — Z6841 Body Mass Index (BMI) 40.0 and over, adult: Secondary | ICD-10-CM

## 2018-02-25 DIAGNOSIS — A419 Sepsis, unspecified organism: Secondary | ICD-10-CM | POA: Diagnosis present

## 2018-02-25 DIAGNOSIS — E662 Morbid (severe) obesity with alveolar hypoventilation: Secondary | ICD-10-CM | POA: Diagnosis present

## 2018-02-25 DIAGNOSIS — N132 Hydronephrosis with renal and ureteral calculous obstruction: Secondary | ICD-10-CM | POA: Diagnosis not present

## 2018-02-25 DIAGNOSIS — Z79890 Hormone replacement therapy (postmenopausal): Secondary | ICD-10-CM

## 2018-02-25 DIAGNOSIS — Z8249 Family history of ischemic heart disease and other diseases of the circulatory system: Secondary | ICD-10-CM

## 2018-02-25 DIAGNOSIS — J9612 Chronic respiratory failure with hypercapnia: Secondary | ICD-10-CM | POA: Diagnosis present

## 2018-02-25 DIAGNOSIS — K429 Umbilical hernia without obstruction or gangrene: Secondary | ICD-10-CM | POA: Diagnosis present

## 2018-02-25 DIAGNOSIS — K5750 Diverticulosis of both small and large intestine without perforation or abscess without bleeding: Secondary | ICD-10-CM | POA: Diagnosis not present

## 2018-02-25 DIAGNOSIS — J449 Chronic obstructive pulmonary disease, unspecified: Secondary | ICD-10-CM | POA: Diagnosis not present

## 2018-02-25 DIAGNOSIS — Z90710 Acquired absence of both cervix and uterus: Secondary | ICD-10-CM

## 2018-02-25 DIAGNOSIS — E039 Hypothyroidism, unspecified: Secondary | ICD-10-CM | POA: Diagnosis present

## 2018-02-25 DIAGNOSIS — R0989 Other specified symptoms and signs involving the circulatory and respiratory systems: Secondary | ICD-10-CM | POA: Diagnosis not present

## 2018-02-25 DIAGNOSIS — Z79899 Other long term (current) drug therapy: Secondary | ICD-10-CM

## 2018-02-25 DIAGNOSIS — J9611 Chronic respiratory failure with hypoxia: Secondary | ICD-10-CM | POA: Diagnosis present

## 2018-02-25 DIAGNOSIS — I10 Essential (primary) hypertension: Secondary | ICD-10-CM | POA: Diagnosis present

## 2018-02-25 DIAGNOSIS — N135 Crossing vessel and stricture of ureter without hydronephrosis: Secondary | ICD-10-CM | POA: Diagnosis not present

## 2018-02-25 DIAGNOSIS — E876 Hypokalemia: Secondary | ICD-10-CM | POA: Diagnosis not present

## 2018-02-25 DIAGNOSIS — C651 Malignant neoplasm of right renal pelvis: Secondary | ICD-10-CM | POA: Diagnosis not present

## 2018-02-25 DIAGNOSIS — I110 Hypertensive heart disease with heart failure: Secondary | ICD-10-CM | POA: Diagnosis not present

## 2018-02-25 DIAGNOSIS — Z972 Presence of dental prosthetic device (complete) (partial): Secondary | ICD-10-CM

## 2018-02-25 DIAGNOSIS — I4891 Unspecified atrial fibrillation: Secondary | ICD-10-CM | POA: Diagnosis present

## 2018-02-25 DIAGNOSIS — I5032 Chronic diastolic (congestive) heart failure: Secondary | ICD-10-CM | POA: Diagnosis present

## 2018-02-25 DIAGNOSIS — N136 Pyonephrosis: Secondary | ICD-10-CM | POA: Diagnosis present

## 2018-02-25 DIAGNOSIS — N1330 Unspecified hydronephrosis: Secondary | ICD-10-CM | POA: Diagnosis not present

## 2018-02-25 DIAGNOSIS — N10 Acute tubulo-interstitial nephritis: Secondary | ICD-10-CM | POA: Diagnosis not present

## 2018-02-25 DIAGNOSIS — Z885 Allergy status to narcotic agent status: Secondary | ICD-10-CM

## 2018-02-25 DIAGNOSIS — Z87440 Personal history of urinary (tract) infections: Secondary | ICD-10-CM | POA: Diagnosis not present

## 2018-02-25 DIAGNOSIS — Z87442 Personal history of urinary calculi: Secondary | ICD-10-CM

## 2018-02-25 DIAGNOSIS — N390 Urinary tract infection, site not specified: Secondary | ICD-10-CM | POA: Diagnosis not present

## 2018-02-25 DIAGNOSIS — C652 Malignant neoplasm of left renal pelvis: Secondary | ICD-10-CM | POA: Diagnosis present

## 2018-02-25 DIAGNOSIS — R319 Hematuria, unspecified: Secondary | ICD-10-CM

## 2018-02-25 DIAGNOSIS — E119 Type 2 diabetes mellitus without complications: Secondary | ICD-10-CM | POA: Diagnosis present

## 2018-02-25 DIAGNOSIS — R6520 Severe sepsis without septic shock: Secondary | ICD-10-CM | POA: Diagnosis not present

## 2018-02-25 DIAGNOSIS — E785 Hyperlipidemia, unspecified: Secondary | ICD-10-CM | POA: Diagnosis present

## 2018-02-25 DIAGNOSIS — Z800 Family history of malignant neoplasm of digestive organs: Secondary | ICD-10-CM

## 2018-02-25 DIAGNOSIS — K219 Gastro-esophageal reflux disease without esophagitis: Secondary | ICD-10-CM | POA: Diagnosis present

## 2018-02-25 DIAGNOSIS — L439 Lichen planus, unspecified: Secondary | ICD-10-CM | POA: Diagnosis not present

## 2018-02-25 DIAGNOSIS — D509 Iron deficiency anemia, unspecified: Secondary | ICD-10-CM | POA: Diagnosis not present

## 2018-02-25 DIAGNOSIS — Z7901 Long term (current) use of anticoagulants: Secondary | ICD-10-CM

## 2018-02-25 DIAGNOSIS — A4151 Sepsis due to Escherichia coli [E. coli]: Secondary | ICD-10-CM | POA: Diagnosis not present

## 2018-02-25 DIAGNOSIS — N201 Calculus of ureter: Secondary | ICD-10-CM | POA: Diagnosis not present

## 2018-02-25 DIAGNOSIS — Z9049 Acquired absence of other specified parts of digestive tract: Secondary | ICD-10-CM

## 2018-02-25 DIAGNOSIS — R1031 Right lower quadrant pain: Secondary | ICD-10-CM | POA: Diagnosis not present

## 2018-02-25 DIAGNOSIS — R7881 Bacteremia: Secondary | ICD-10-CM | POA: Diagnosis present

## 2018-02-25 LAB — COMPREHENSIVE METABOLIC PANEL
ALBUMIN: 3.3 g/dL — AB (ref 3.5–5.0)
ALT: 19 U/L (ref 0–44)
ANION GAP: 11 (ref 5–15)
AST: 21 U/L (ref 15–41)
Alkaline Phosphatase: 74 U/L (ref 38–126)
BILIRUBIN TOTAL: 0.8 mg/dL (ref 0.3–1.2)
BUN: 10 mg/dL (ref 8–23)
CALCIUM: 8.7 mg/dL — AB (ref 8.9–10.3)
CO2: 27 mmol/L (ref 22–32)
Chloride: 100 mmol/L (ref 98–111)
Creatinine, Ser: 0.92 mg/dL (ref 0.44–1.00)
GFR calc non Af Amer: 56 mL/min — ABNORMAL LOW (ref >60)
GLUCOSE: 129 mg/dL — AB (ref 70–99)
POTASSIUM: 3.3 mmol/L — AB (ref 3.5–5.1)
SODIUM: 138 mmol/L (ref 135–145)
TOTAL PROTEIN: 6.2 g/dL — AB (ref 6.5–8.1)

## 2018-02-25 LAB — CBC WITH DIFFERENTIAL/PLATELET
ABS IMMATURE GRANULOCYTES: 0.1 10*3/uL (ref 0.0–0.1)
BASOS PCT: 1 %
Basophils Absolute: 0.1 10*3/uL (ref 0.0–0.1)
EOS ABS: 0.2 10*3/uL (ref 0.0–0.7)
Eosinophils Relative: 2 %
HCT: 37.9 % (ref 36.0–46.0)
Hemoglobin: 11.7 g/dL — ABNORMAL LOW (ref 12.0–15.0)
Immature Granulocytes: 0 %
Lymphocytes Relative: 10 %
Lymphs Abs: 1.1 10*3/uL (ref 0.7–4.0)
MCH: 30.2 pg (ref 26.0–34.0)
MCHC: 30.9 g/dL (ref 30.0–36.0)
MCV: 97.9 fL (ref 78.0–100.0)
MONOS PCT: 7 %
Monocytes Absolute: 0.9 10*3/uL (ref 0.1–1.0)
NEUTROS ABS: 9.2 10*3/uL — AB (ref 1.7–7.7)
NEUTROS PCT: 80 %
PLATELETS: 240 10*3/uL (ref 150–400)
RBC: 3.87 MIL/uL (ref 3.87–5.11)
RDW: 13.7 % (ref 11.5–15.5)
WBC: 11.5 10*3/uL — ABNORMAL HIGH (ref 4.0–10.5)

## 2018-02-25 LAB — URINALYSIS, ROUTINE W REFLEX MICROSCOPIC
Bilirubin Urine: NEGATIVE
Glucose, UA: NEGATIVE mg/dL
Ketones, ur: NEGATIVE mg/dL
Nitrite: NEGATIVE
Protein, ur: 100 mg/dL — AB
Specific Gravity, Urine: 1.012 (ref 1.005–1.030)
pH: 6 (ref 5.0–8.0)

## 2018-02-25 LAB — PROTIME-INR
INR: 1.45
Prothrombin Time: 17.5 seconds — ABNORMAL HIGH (ref 11.4–15.2)

## 2018-02-25 LAB — I-STAT CG4 LACTIC ACID, ED
LACTIC ACID, VENOUS: 1.78 mmol/L (ref 0.5–1.9)
Lactic Acid, Venous: 1.83 mmol/L (ref 0.5–1.9)

## 2018-02-25 MED ORDER — ACETAMINOPHEN 650 MG RE SUPP
650.0000 mg | RECTAL | Status: DC | PRN
  Filled 2018-02-25: qty 1

## 2018-02-25 MED ORDER — LACTATED RINGERS IV BOLUS
1000.0000 mL | Freq: Once | INTRAVENOUS | Status: AC
  Administered 2018-02-25: 1000 mL via INTRAVENOUS

## 2018-02-25 MED ORDER — ONDANSETRON HCL 4 MG/2ML IJ SOLN: 4.0000 mg | Freq: Four times a day (QID) | INTRAMUSCULAR | Status: DC | PRN

## 2018-02-25 MED ORDER — ACETAMINOPHEN 650 MG RE SUPP: 650.0000 mg | Freq: Four times a day (QID) | RECTAL | Status: DC | PRN

## 2018-02-25 MED ORDER — ONDANSETRON HCL 4 MG/2ML IJ SOLN
4.0000 mg | Freq: Once | INTRAMUSCULAR | Status: AC
  Administered 2018-02-25: 4 mg via INTRAVENOUS
  Filled 2018-02-25: qty 2

## 2018-02-25 MED ORDER — VANCOMYCIN HCL 10 G IV SOLR
1500.0000 mg | INTRAVENOUS | Status: DC
  Administered 2018-02-25: 1500 mg via INTRAVENOUS
  Filled 2018-02-25: qty 1500

## 2018-02-25 MED ORDER — ONDANSETRON HCL 4 MG PO TABS: 4.0000 mg | ORAL_TABLET | Freq: Four times a day (QID) | ORAL | Status: DC | PRN

## 2018-02-25 MED ORDER — ACETAMINOPHEN 325 MG PO TABS
650.0000 mg | ORAL_TABLET | Freq: Four times a day (QID) | ORAL | Status: DC | PRN
  Administered 2018-02-27 – 2018-02-28 (×2): 650 mg via ORAL
  Filled 2018-02-25 (×4): qty 2

## 2018-02-25 MED ORDER — LACTATED RINGERS IV SOLN
INTRAVENOUS | Status: AC
  Administered 2018-02-26: 01:00:00 via INTRAVENOUS
  Administered 2018-02-26: 1000 mL via INTRAVENOUS

## 2018-02-25 MED ORDER — ALBUTEROL SULFATE (2.5 MG/3ML) 0.083% IN NEBU
2.5000 mg | INHALATION_SOLUTION | Freq: Four times a day (QID) | RESPIRATORY_TRACT | Status: DC | PRN
Start: 2018-02-25 — End: 2018-03-01

## 2018-02-25 MED ORDER — CEFTRIAXONE SODIUM 1 G IJ SOLR
1.0000 g | Freq: Once | INTRAMUSCULAR | Status: AC
  Administered 2018-02-25: 1 g via INTRAVENOUS
  Filled 2018-02-25: qty 10

## 2018-02-25 MED ORDER — CEFTRIAXONE SODIUM 2 G IJ SOLR
2.0000 g | INTRAMUSCULAR | Status: DC
  Administered 2018-02-26 – 2018-02-28 (×3): 2 g via INTRAVENOUS
  Filled 2018-02-25 (×2): qty 2
  Filled 2018-02-25: qty 20

## 2018-02-25 MED ORDER — IOHEXOL 300 MG/ML  SOLN
100.0000 mL | Freq: Once | INTRAMUSCULAR | Status: AC | PRN
  Administered 2018-02-25: 100 mL via INTRAVENOUS

## 2018-02-25 NOTE — ED Provider Notes (Signed)
[facility] Provider Note   CSN: [id number] Arrival date & time: 02/25/18  1709  History   Chief Complaint Chief Complaint  Patient presents with  . Abdominal Pain    HPI [name] Case is a 84 y.o. female.  The history is provided by the patient and medical records.   84yo F with PMHx of afib (on eliquis), COPD, DM, bilateral upper tract transitional cell carcinoma with recent b/l tumor excision and stent placement with removal 2 days ago with Dr.Herrick who presents with R flank pain today. Accompanied by fever, nausea, decreased PO intake. Improved with tylenol, no pain currently. Radiates to R inguinal region. Denies abnormal BMs. Denies dysuria or urgency.   Past Medical History:  Diagnosis Date  . Allergy   . Arthritis   . Atrial fibrillation ([place])   . Bronchitis   . Cancer ([place])    Bilateral upper tract transitional cell carcinoma  . Cardiomegaly   . CHF (congestive heart failure) ([place])   . Complication of anesthesia    Difficult to arouse  . COPD (chronic obstructive pulmonary disease) ([facility])    borderline   . Diabetes mellitus without complication ([place])    Controlled by diet and exercise  . Diverticulosis of sigmoid colon   . Fatty liver   . GERD (gastroesophageal reflux disease)   . History of hiatal hernia 01/20/2015   Small sliding, noted on CT  . History of kidney stones   . Hyperlipidemia   . Hypertension   . Hypothyroidism   . PONV (postoperative nausea and vomiting)   . Supraumbilical hernia 50/05/3817   Small noted on CT  . Thyroid disease   . UTI (urinary tract infection)     Patient Active Problem List   Diagnosis Date Noted  . Sepsis ([place]) 02/25/2018  . Uncontrolled pain 02/10/2018  . Pyohydronephrosis 12/04/2017  . Lichen planus 29/93/7169  . Acute respiratory failure with hypoxia ([place])   . Obesity hypoventilation syndrome ([place])   . Chronic CHF (congestive heart failure) ([place]) 01/09/2016  .  Atrial fibrillation with RVR ([place]) 01/09/2016  . History of snoring 05/20/2015  . Chronic respiratory failure with hypercapnia ([place])   . Type 2 diabetes mellitus without complication ([employer])   . Atrial fibrillation ([place]) 01/06/2015  . Hypothyroidism 03/09/2007  . Hyperlipidemia 03/09/2007  . Morbid obesity ([place]) 03/09/2007  . Essential hypertension 03/09/2007  . ALLERGIC RHINITIS 03/09/2007  . Asthma 03/09/2007  . GERD 03/09/2007  . INCONTINENCE 03/09/2007    Past Surgical History:  Procedure Laterality Date  . ABDOMINAL HYSTERECTOMY    . APPENDECTOMY    . CHOLECYSTECTOMY    . COLONOSCOPY  07/16/2009  . CYSTOSCOPY W/ URETERAL STENT PLACEMENT Right 12/04/2017   Procedure: CYSTOSCOPY WITH RETROGRADE PYELOGRAM/URETERAL STENT PLACEMENT;  Surgeon: Festus Aloe, MD;  Location: [facility];  Service: Urology;  Laterality: Right;  . CYSTOSCOPY WITH URETEROSCOPY AND STENT PLACEMENT Bilateral 01/29/2018   Procedure: BILATERAL URETEROSCOPY AND TUMOR RESECTION  WITH LASER RIGHT STENT EXCHANGE LEFT STENT PLACEMENT;  Surgeon: Ardis Hughs, MD;  Location: [facility];  Service: Urology;  Laterality: Bilateral;  . CYSTOSCOPY WITH URETEROSCOPY AND STENT PLACEMENT Bilateral 02/10/2018   Procedure: BILATERAL URETEROSCOPY AND TUMOR EXCISION BILATERAL STENT EXCHANGE;  Surgeon: Ardis Hughs, MD;  Location: [facility];  Service: Urology;  Laterality: Bilateral;  . CYSTOSCOPY/RETROGRADE/URETEROSCOPY Bilateral 12/24/2017   Procedure: BILATERAL URETEROSCOPY, RIGHT STENT EXCHANGE, STONE EXTRACTION WITH BASKET BILATERAL RETROGRADE PYELOGRAM, BIOPSY OF RIGHT UPPER POLE TUMOR,  BILATERAL  RENAL PELVIS FLUID SENT FOR CYTOLOGY;  Surgeon: Ardis Hughs, MD;  Location: [facility];  Service: Urology;  Laterality: Bilateral;  . KNEE SURGERY Right   . KNEE SURGERY Left    torn ligament repair  . UPPER GI ENDOSCOPY       OB History   None      Home Medications    Prior to Admission medications   Medication Sig  Start Date End Date Taking? Authorizing Provider  acetaminophen (TYLENOL) 500 MG tablet Take 500-1,000 mg by mouth every 8 (eight) hours as needed for moderate pain or headache.    Yes [provider]  albuterol (PROVENTIL) (2.5 MG/3ML) 0.083% nebulizer solution Take 3 mLs (2.5 mg total) by nebulization every 6 (six) hours as needed for wheezing or shortness of breath. 10/28/17  Yes Danford, Suann Larry, MD  apixaban (ELIQUIS) 5 MG TABS tablet Take 1 tablet (5 mg total) by mouth 2 (two) times daily. Resume once the bleeding has stopped from the surgery for 2 days. Patient taking differently: Take 5 mg by mouth 2 (two) times daily.  02/10/18  Yes Ardis Hughs, MD  atorvastatin (LIPITOR) 20 MG tablet TAKE 1 TABLET BY MOUTH DAILY AT 6 PM 01/26/18  Yes Hoyt Koch, MD  clotrimazole-betamethasone (LOTRISONE) cream Apply 1 application topically See admin instructions. Apply 1 application to vagina two times a week as directed 02/22/18  Yes [provider]  diltiazem (CARDIZEM CD) 180 MG 24 hr capsule Take 1 capsule (180 mg total) by mouth daily. 10/15/17  Yes Hoyt Koch, MD  furosemide (LASIX) 80 MG tablet TAKE 1/2 TABLET BY MOUTH DAILY 12/08/17  Yes Hoyt Koch, MD  irbesartan (AVAPRO) 150 MG tablet Take 0.5 tablets (75 mg total) by mouth daily. 06/03/17  Yes Hoyt Koch, MD  levothyroxine (SYNTHROID, LEVOTHROID) 100 MCG tablet Take 1 tablet (100 mcg total) by mouth daily. Needs annual visit with labs for further refills Patient taking differently: Take 100 mcg by mouth daily before breakfast.  12/17/17  Yes Hoyt Koch, MD  metoprolol succinate (TOPROL-XL) 50 MG 24 hr tablet TAKE 1 TABLET BY MOUTH DAILY WITH OR IMMEDIATELY FOLLOWING A MEAL 02/11/18  Yes Jerline Pain, MD  ondansetron (ZOFRAN) 4 MG tablet Take 1 tablet (4 mg total) by mouth every 8 (eight) hours as needed for nausea or vomiting. 02/11/18  Yes Ardis Hughs, MD    ranitidine (ZANTAC) 150 MG tablet Take 150 mg by mouth daily as needed for heartburn.   Yes [provider]  ciprofloxacin (CIPRO) 500 MG tablet Take 1 tablet (500 mg total) by mouth 2 (two) times daily. Patient not taking: Reported on 02/25/2018 01/31/18   Carmin Muskrat, MD    Family History Family History  Problem Relation Age of Onset  . Heart disease Mother   . Colon cancer Mother     Social History Social History   Tobacco Use  . Smoking status: Never Smoker  . Smokeless tobacco: Never Used  Substance Use Topics  . Alcohol use: No    Alcohol/week: 0.0 oz  . Drug use: No     Allergies   Codeine   Review of Systems Review of Systems  Constitutional: Positive for appetite change, fatigue and fever. Negative for chills.  HENT: Negative for ear pain and sore throat.   Eyes: Negative for pain and visual disturbance.  Respiratory: Negative for cough and shortness of breath.   Cardiovascular: Negative for chest pain and palpitations.  Gastrointestinal: Positive for abdominal pain and nausea. Negative for vomiting.  Genitourinary: Positive for flank pain. Negative for dysuria and hematuria.  Musculoskeletal: Negative for arthralgias and back pain.  Skin: Negative for color change and rash.  Neurological: Negative for seizures and syncope.  All other systems reviewed and are negative.    Physical Exam Updated Vital Signs BP 128/63   Pulse 89   Temp (!) 102.7 F (39.3 C) (Oral)   Resp (!) 21   Ht 5\' 1"  (1.549 m)   Wt 97.5 kg (214 lb 15.2 oz)   LMP  (LMP Unknown)   SpO2 99%   BMI 40.61 kg/m   Physical Exam  Constitutional: She appears well-developed and well-nourished. No distress.  HENT:  Head: Normocephalic and atraumatic.  Mouth/Throat: Oropharynx is clear and moist.  Eyes: Conjunctivae and EOM are normal.  Neck: Neck supple.  Cardiovascular: Normal rate and intact distal pulses. An irregularly irregular rhythm present. Exam reveals no friction  rub.  No murmur heard. Pulmonary/Chest: Effort normal and breath sounds normal. No stridor. No respiratory distress. She has no wheezes.  Abdominal: Soft. She exhibits no distension. There is no tenderness. There is no rebound and no guarding.  Musculoskeletal: She exhibits no edema.  Neurological: She is alert.  Skin: Skin is warm and dry.  Psychiatric: She has a normal mood and affect.  Nursing note and vitals reviewed.    ED Treatments / Results  Labs (all labs ordered are listed, but only abnormal results are displayed) Labs Reviewed  COMPREHENSIVE METABOLIC PANEL - Abnormal; Notable for the following components:      Result Value   Potassium 3.3 (*)    Glucose, Bld 129 (*)    Calcium 8.7 (*)    Total Protein 6.2 (*)    Albumin 3.3 (*)    GFR calc non Af Amer 56 (*)    All other components within normal limits  CBC WITH DIFFERENTIAL/PLATELET - Abnormal; Notable for the following components:   WBC 11.5 (*)    Hemoglobin 11.7 (*)    Neutro Abs 9.2 (*)    All other components within normal limits  PROTIME-INR - Abnormal; Notable for the following components:   Prothrombin Time 17.5 (*)    All other components within normal limits  URINALYSIS, ROUTINE W REFLEX MICROSCOPIC - Abnormal; Notable for the following components:   APPearance CLOUDY (*)    Hgb urine dipstick LARGE (*)    Protein, ur 100 (*)    Leukocytes, UA LARGE (*)    RBC / HPF >50 (*)    WBC, UA >50 (*)    Bacteria, UA MANY (*)    All other components within normal limits  CULTURE, BLOOD (ROUTINE X 2)  CULTURE, BLOOD (ROUTINE X 2)  URINE CULTURE  CBC WITH DIFFERENTIAL/PLATELET  COMPREHENSIVE METABOLIC PANEL  LACTIC ACID, PLASMA  PROCALCITONIN  PROTIME-INR  APTT  I-STAT CG4 LACTIC ACID, ED  I-STAT CG4 LACTIC ACID, ED    EKG None  Radiology Dg Chest 2 View  Result Date: 02/25/2018 CLINICAL DATA:  Right-sided flank pain and abdominal pain.  Fever. EXAM: CHEST - 2 VIEW COMPARISON:  10/26/2017 and  12/06/2017 FINDINGS: Lungs are adequately inflated with minimal prominence of the perihilar markings. No focal lobar consolidation or effusion. Mild stable cardiomegaly. Remainder of the exam is unchanged. IMPRESSION: Stable cardiomegaly with suggestion of minimal vascular congestion. Electronically Signed   By: Marin Olp M.D.   On: 02/25/2018 18:06   Ct Abdomen Pelvis W  Contrast  Result Date: 02/25/2018 CLINICAL DATA:  84 year old female with abdominal pain. EXAM: CT ABDOMEN AND PELVIS WITH CONTRAST TECHNIQUE: Multidetector CT imaging of the abdomen and pelvis was performed using the standard protocol following bolus administration of intravenous contrast. CONTRAST:  160mL OMNIPAQUE IOHEXOL 300 MG/ML  SOLN COMPARISON:  Abdominal CT dated 01/07/2018 FINDINGS: Lower chest: Minimal bibasilar scarring. The visualized lung bases are otherwise clear. There is mild cardiomegaly. No intra-abdominal free air or free fluid. Hepatobiliary: Cholecystectomy. No intrahepatic biliary ductal dilatation. The liver is unremarkable. Pancreas: Fatty infiltration of the pancreas. No dilatation of the main pancreatic duct. Spleen: Normal in size without focal abnormality. Adrenals/Urinary Tract: The adrenal glands are unremarkable. There is a 7 mm stone in the distal right ureter with mild right hydronephrosis. There is mild fullness of the left ureter. No definite stone identified along the course of the left ureter. There is stranding of the right perinephric and bilateral periureteric fat. Correlation with urinalysis recommended to exclude cystitis. Right renal 5 cm upper pole cyst. Small amount of air within the urinary bladder likely related to recent instrumentation. Clinical correlation is recommended. Stomach/Bowel: There multiple duodenal diverticula measuring up to 3 cm without active inflammatory changes. There is sigmoid diverticulosis and scattered colonic diverticula without active inflammatory changes. There is no  bowel obstruction or active inflammation. Appendectomy. Vascular/Lymphatic: Moderate aortoiliac atherosclerotic disease. No portal venous gas. There is no adenopathy. Reproductive: Hysterectomy.  No pelvic mass. Other: Small fat containing umbilical hernia.  No fluid collection. Musculoskeletal: Bilateral L5 pars defects. No acute osseous pathology. There are degenerative changes of the spine with disc desiccation. IMPRESSION: 1. A 7 mm distal right ureteral calculus with mild right hydronephrosis. Correlation with urinalysis recommended to exclude superimposed UTI. 2. Colonic diverticulosis as well as multiple duodenal diverticula. No bowel obstruction or active inflammation. 3.  Aortic Atherosclerosis (ICD10-I70.0). Electronically Signed   By: Anner Crete M.D.   On: 02/25/2018 21:18    Procedures Procedures (including critical care time)  Medications Ordered in ED Medications  vancomycin (VANCOCIN) 1,500 mg in sodium chloride 0.9 % 500 mL IVPB (1,500 mg Intravenous New Bag/Given 02/25/18 2234)  lactated ringers infusion (has no administration in time range)  albuterol (PROVENTIL) (2.5 MG/3ML) 0.083% nebulizer solution 2.5 mg (has no administration in time range)  acetaminophen (TYLENOL) tablet 650 mg (has no administration in time range)    Or  acetaminophen (TYLENOL) suppository 650 mg (has no administration in time range)  ondansetron (ZOFRAN) tablet 4 mg (has no administration in time range)    Or  ondansetron (ZOFRAN) injection 4 mg (has no administration in time range)  cefTRIAXone (ROCEPHIN) 2 g in sodium chloride 0.9 % 100 mL IVPB (has no administration in time range)  lactated ringers bolus 1,000 mL (0 mLs Intravenous Stopped 02/25/18 2127)  ondansetron (ZOFRAN) injection 4 mg (4 mg Intravenous Given 02/25/18 1959)  iohexol (OMNIPAQUE) 300 MG/ML solution 100 mL (100 mLs Intravenous Contrast Given 02/25/18 2041)  cefTRIAXone (ROCEPHIN) 1 g in sodium chloride 0.9 % 100 mL IVPB (0 g  Intravenous Stopped 02/25/18 2235)     Initial Impression / Assessment and Plan / ED Course  I have reviewed the triage vital signs and the nursing notes.  Pertinent labs & imaging results that were available during my care of the patient were reviewed by me and considered in my medical decision making (see chart for details).     [name] Case is a 84 y.o. female with PMHx of afib (on  eliquis), COPD, DM, bilateral upper tract transitional cell carcinoma with recent b/l tumor excision and stent placement with removal 2 days ago with Dr.Herrick who presents with R flank pain today. Reviewed and confirmed nursing documentation for past medical history, family history, social history. VS afebrile, BP 108/50 (MAP 55).  Exam umremarkable, benign abd. Ddx includes concern for pyelonephritis, UTI with recent instrumentation, nephrolithiasis.   BP improved with 1L LR. Lactic acid wnl. UA with large hgb, large leuks, >50 RBC, >50 WBC, many bacteria. Urine culture pending. Given recent instrumentation, IV vanc/ceftraixone given. Blood cultures pending. CMP with mild hypokalemia 3.3, hypoalbuminemia 3.3, otherwise unremarkable. CBC with leukocytosis 11.5, stable normocytic anemia with hgb 11.7, otherwise unremarkable. CXR with stable cardiomegaly, minimal vascular congestion. CT A/p w/ contrast with 38mm distal R ureteral calculus with mild R hydro. Colonic diverticulosis with no active inflammation.   Discussed case with Dr.Ottelin, Urology, who recommended admission to medicine, transfer to [facility], keep pt NPO and keep IV abx for possible procedural intervention. Admitted to hospitalist, who will arrange admission at [facility].   Old records reviewed. Labs reviewed by me and used in the medical decision making.  Imaging viewed and interpreted by me and used in the medical decision making (formal interpretation from radiologist). EKG reviewed by me and used in the medical decision making.   Final Clinical  Impressions(s) / ED Diagnoses   Final diagnoses:  Ureterolithiasis  Urinary tract infection with hematuria, site unspecified     Norm Salt, MD 02/25/18 3833    Tanna Furry, MD 02/26/18 [phone]

## 2018-02-25 NOTE — ED Notes (Signed)
Per MD Hal Hope- pt to be admitted to [facility]. [facility] and bed placement aware

## 2018-02-25 NOTE — ED Notes (Signed)
Patient transported to X-ray 

## 2018-02-25 NOTE — ED Triage Notes (Signed)
Pt c/o right sided flank/abdominal pain. Pt had stents removed from ureter, s/p renal tumor removal. C/o fevers at home, afebrile in triage, took tylenol PTA.

## 2018-02-25 NOTE — H&P (Signed)
History and Physical    [name] Case JME:[id number] DOB: [birth year] DOA: 02/25/2018  PCP: Hoyt Koch, MD  Patient coming from: Home.  Chief Complaint: Right flank pain.  HPI: [name] Case is a 84 y.o. 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  Has been feeling weak.  Had one episode of vomiting.  Denies any diarrhea.  Denies chest pain or shortness of breath.  ED Course: In the ER patient is found to be febrile tachycardic and was initially hypotensive.  Blood pressure improved with fluids.  UA is consistent with UTI.  CT abdomen pelvis shows right-sided hydronephrosis with distal ureteral stone.  On-call urologist Dr. Edwena Blow was consulted and patient is likely to go for stent placement and will be transferred to [facility].  Review of Systems: As per HPI, rest all negative.   Past Medical History:  Diagnosis Date  . Allergy   . Arthritis   . Atrial fibrillation ([place])   . Bronchitis   . Cancer ([place])    Bilateral upper tract transitional cell carcinoma  . Cardiomegaly   . CHF (congestive heart failure) ([place])   . Complication of anesthesia    Difficult to arouse  . COPD (chronic obstructive pulmonary disease) ([facility])    borderline   . Diabetes mellitus without complication ([place])    Controlled by diet and exercise  . Diverticulosis of sigmoid colon   . Fatty liver   . GERD (gastroesophageal reflux disease)   . History of hiatal hernia 01/20/2015   Small sliding, noted on CT  . History of kidney stones   . Hyperlipidemia   . Hypertension   . Hypothyroidism   . PONV (postoperative nausea and vomiting)   . Supraumbilical hernia 22/97/9892   Small noted on CT  . Thyroid disease   . UTI (urinary tract infection)     Past Surgical History:  Procedure Laterality  Date  . ABDOMINAL HYSTERECTOMY    . APPENDECTOMY    . CHOLECYSTECTOMY    . COLONOSCOPY  07/16/2009  . CYSTOSCOPY W/ URETERAL STENT PLACEMENT Right 12/04/2017   Procedure: CYSTOSCOPY WITH RETROGRADE PYELOGRAM/URETERAL STENT PLACEMENT;  Surgeon: Festus Aloe, MD;  Location: [facility];  Service: Urology;  Laterality: Right;  . CYSTOSCOPY WITH URETEROSCOPY AND STENT PLACEMENT Bilateral 01/29/2018   Procedure: BILATERAL URETEROSCOPY AND TUMOR RESECTION  WITH LASER RIGHT STENT EXCHANGE LEFT STENT PLACEMENT;  Surgeon: Ardis Hughs, MD;  Location: [facility];  Service: Urology;  Laterality: Bilateral;  . CYSTOSCOPY WITH URETEROSCOPY AND STENT PLACEMENT Bilateral 02/10/2018   Procedure: BILATERAL URETEROSCOPY AND TUMOR EXCISION BILATERAL STENT EXCHANGE;  Surgeon: Ardis Hughs, MD;  Location: [facility];  Service: Urology;  Laterality: Bilateral;  . CYSTOSCOPY/RETROGRADE/URETEROSCOPY Bilateral 12/24/2017   Procedure: BILATERAL URETEROSCOPY, RIGHT STENT EXCHANGE, STONE EXTRACTION WITH BASKET BILATERAL RETROGRADE PYELOGRAM, BIOPSY OF RIGHT UPPER POLE TUMOR,  BILATERAL RENAL PELVIS FLUID SENT FOR CYTOLOGY;  Surgeon: Ardis Hughs, MD;  Location: [facility];  Service: Urology;  Laterality: Bilateral;  . KNEE SURGERY Right   . KNEE SURGERY Left    torn ligament repair  . UPPER GI ENDOSCOPY       reports that she has never smoked. She has never used smokeless tobacco. She reports that she does not drink alcohol or use drugs.  Allergies  Allergen Reactions  .  Codeine Hives and Rash    In cough syrup preparations    Family History  Problem Relation Age of Onset  . Heart disease Mother   . Colon cancer Mother     Prior to Admission medications   Medication Sig Start Date End Date Taking? Authorizing Provider  acetaminophen (TYLENOL) 500 MG tablet Take 500-1,000 mg by mouth every 8 (eight) hours as needed for moderate pain or headache.    Yes [provider]  albuterol (PROVENTIL) (2.5  MG/3ML) 0.083% nebulizer solution Take 3 mLs (2.5 mg total) by nebulization every 6 (six) hours as needed for wheezing or shortness of breath. 10/28/17  Yes Danford, Suann Larry, MD  apixaban (ELIQUIS) 5 MG TABS tablet Take 1 tablet (5 mg total) by mouth 2 (two) times daily. Resume once the bleeding has stopped from the surgery for 2 days. Patient taking differently: Take 5 mg by mouth 2 (two) times daily.  02/10/18  Yes Ardis Hughs, MD  atorvastatin (LIPITOR) 20 MG tablet TAKE 1 TABLET BY MOUTH DAILY AT 6 PM 01/26/18  Yes Hoyt Koch, MD  clotrimazole-betamethasone (LOTRISONE) cream Apply 1 application topically See admin instructions. Apply 1 application to vagina two times a week as directed 02/22/18  Yes [provider]  diltiazem (CARDIZEM CD) 180 MG 24 hr capsule Take 1 capsule (180 mg total) by mouth daily. 10/15/17  Yes Hoyt Koch, MD  furosemide (LASIX) 80 MG tablet TAKE 1/2 TABLET BY MOUTH DAILY 12/08/17  Yes Hoyt Koch, MD  irbesartan (AVAPRO) 150 MG tablet Take 0.5 tablets (75 mg total) by mouth daily. 06/03/17  Yes Hoyt Koch, MD  levothyroxine (SYNTHROID, LEVOTHROID) 100 MCG tablet Take 1 tablet (100 mcg total) by mouth daily. Needs annual visit with labs for further refills Patient taking differently: Take 100 mcg by mouth daily before breakfast.  12/17/17  Yes Hoyt Koch, MD  metoprolol succinate (TOPROL-XL) 50 MG 24 hr tablet TAKE 1 TABLET BY MOUTH DAILY WITH OR IMMEDIATELY FOLLOWING A MEAL 02/11/18  Yes Jerline Pain, MD  ondansetron (ZOFRAN) 4 MG tablet Take 1 tablet (4 mg total) by mouth every 8 (eight) hours as needed for nausea or vomiting. 02/11/18  Yes Ardis Hughs, MD  ranitidine (ZANTAC) 150 MG tablet Take 150 mg by mouth daily as needed for heartburn.   Yes [provider]  ciprofloxacin (CIPRO) 500 MG tablet Take 1 tablet (500 mg total) by mouth 2 (two) times daily. Patient not taking: Reported on  02/25/2018 01/31/18   Carmin Muskrat, MD    Physical Exam: Vitals:   02/25/18 2030 02/25/18 2100 02/25/18 2200 02/25/18 2229  BP: (!) 109/49 (!) 120/58 128/63   Pulse: 91 (!) 132 89   Resp: (!) 23 18 (!) 21   Temp:    (!) 102.7 F (39.3 C)  TempSrc:    Oral  SpO2: (!) 85% 96% 99%   Weight:  97.5 kg (214 lb 15.2 oz)    Height:  5\' 1"  (1.549 m)        Constitutional: Moderately built and nourished. Vitals:   02/25/18 2030 02/25/18 2100 02/25/18 2200 02/25/18 2229  BP: (!) 109/49 (!) 120/58 128/63   Pulse: 91 (!) 132 89   Resp: (!) 23 18 (!) 21   Temp:    (!) 102.7 F (39.3 C)  TempSrc:    Oral  SpO2: (!) 85% 96% 99%   Weight:  97.5 kg (214 lb 15.2 oz)  Height:  5\' 1"  (1.549 m)     Eyes: Anicteric no pallor. ENMT: No discharge from the ears eyes nose or mouth. Neck: No mass felt.  No neck rigidity.  No JVD appreciated. Respiratory: No rhonchi or crepitations. Cardiovascular: S1-S2 heard no murmurs appreciated. Abdomen: Soft nontender bowel sounds present. Musculoskeletal: No edema.  No joint effusion. Skin: No rash. Neurologic: Alert awake oriented to time place and person.  Moves all extremities. Psychiatric: Appears normal.  Normal affect.   Labs on Admission: I have personally reviewed following labs and imaging studies  CBC: Recent Labs  Lab 02/25/18 1737  WBC 11.5*  NEUTROABS 9.2*  HGB 11.7*  HCT 37.9  MCV 97.9  PLT 035   Basic Metabolic Panel: Recent Labs  Lab 02/25/18 1737  NA 138  K 3.3*  CL 100  CO2 27  GLUCOSE 129*  BUN 10  CREATININE 0.92  CALCIUM 8.7*   GFR: Estimated Creatinine Clearance: 48.6 mL/min (by C-G formula based on SCr of 0.92 mg/dL). Liver Function Tests: Recent Labs  Lab 02/25/18 1737  AST 21  ALT 19  ALKPHOS 74  BILITOT 0.8  PROT 6.2*  ALBUMIN 3.3*   No results for input(s): LIPASE, AMYLASE in the last 168 hours. No results for input(s): AMMONIA in the last 168 hours. Coagulation Profile: Recent Labs  Lab  02/25/18 1737  INR 1.45   Cardiac Enzymes: No results for input(s): CKTOTAL, CKMB, CKMBINDEX, TROPONINI in the last 168 hours. BNP (last 3 results) No results for input(s): PROBNP in the last 8760 hours. HbA1C: No results for input(s): HGBA1C in the last 72 hours. CBG: No results for input(s): GLUCAP in the last 168 hours. Lipid Profile: No results for input(s): CHOL, HDL, LDLCALC, TRIG, CHOLHDL, LDLDIRECT in the last 72 hours. Thyroid Function Tests: No results for input(s): TSH, T4TOTAL, FREET4, T3FREE, THYROIDAB in the last 72 hours. Anemia Panel: No results for input(s): VITAMINB12, FOLATE, FERRITIN, TIBC, IRON, RETICCTPCT in the last 72 hours. Urine analysis:    Component Value Date/Time   COLORURINE YELLOW 02/25/2018 1935   APPEARANCEUR CLOUDY (A) 02/25/2018 1935   LABSPEC 1.012 02/25/2018 1935   PHURINE 6.0 02/25/2018 1935   GLUCOSEU NEGATIVE 02/25/2018 1935   HGBUR LARGE (A) 02/25/2018 1935   BILIRUBINUR NEGATIVE 02/25/2018 1935   BILIRUBINUR n 07/01/2012 1115   KETONESUR NEGATIVE 02/25/2018 1935   PROTEINUR 100 (A) 02/25/2018 1935   UROBILINOGEN 0.2 01/20/2015 1153   NITRITE NEGATIVE 02/25/2018 1935   LEUKOCYTESUR LARGE (A) 02/25/2018 1935   Sepsis Labs: @LABRCNTIP (procalcitonin:4,lacticidven:4) )No results found for this or any previous visit (from the past 240 hour(s)).   Radiological Exams on Admission: Dg Chest 2 View  Result Date: 02/25/2018 CLINICAL DATA:  Right-sided flank pain and abdominal pain.  Fever. EXAM: CHEST - 2 VIEW COMPARISON:  10/26/2017 and 12/06/2017 FINDINGS: Lungs are adequately inflated with minimal prominence of the perihilar markings. No focal lobar consolidation or effusion. Mild stable cardiomegaly. Remainder of the exam is unchanged. IMPRESSION: Stable cardiomegaly with suggestion of minimal vascular congestion. Electronically Signed   By: Marin Olp M.D.   On: 02/25/2018 18:06   Ct Abdomen Pelvis W Contrast  Result Date:  02/25/2018 CLINICAL DATA:  84 year old female with abdominal pain. EXAM: CT ABDOMEN AND PELVIS WITH CONTRAST TECHNIQUE: Multidetector CT imaging of the abdomen and pelvis was performed using the standard protocol following bolus administration of intravenous contrast. CONTRAST:  138mL OMNIPAQUE IOHEXOL 300 MG/ML  SOLN COMPARISON:  Abdominal CT dated 01/07/2018 FINDINGS: Lower chest:  Minimal bibasilar scarring. The visualized lung bases are otherwise clear. There is mild cardiomegaly. No intra-abdominal free air or free fluid. Hepatobiliary: Cholecystectomy. No intrahepatic biliary ductal dilatation. The liver is unremarkable. Pancreas: Fatty infiltration of the pancreas. No dilatation of the main pancreatic duct. Spleen: Normal in size without focal abnormality. Adrenals/Urinary Tract: The adrenal glands are unremarkable. There is a 7 mm stone in the distal right ureter with mild right hydronephrosis. There is mild fullness of the left ureter. No definite stone identified along the course of the left ureter. There is stranding of the right perinephric and bilateral periureteric fat. Correlation with urinalysis recommended to exclude cystitis. Right renal 5 cm upper pole cyst. Small amount of air within the urinary bladder likely related to recent instrumentation. Clinical correlation is recommended. Stomach/Bowel: There multiple duodenal diverticula measuring up to 3 cm without active inflammatory changes. There is sigmoid diverticulosis and scattered colonic diverticula without active inflammatory changes. There is no bowel obstruction or active inflammation. Appendectomy. Vascular/Lymphatic: Moderate aortoiliac atherosclerotic disease. No portal venous gas. There is no adenopathy. Reproductive: Hysterectomy.  No pelvic mass. Other: Small fat containing umbilical hernia.  No fluid collection. Musculoskeletal: Bilateral L5 pars defects. No acute osseous pathology. There are degenerative changes of the spine with  disc desiccation. IMPRESSION: 1. A 7 mm distal right ureteral calculus with mild right hydronephrosis. Correlation with urinalysis recommended to exclude superimposed UTI. 2. Colonic diverticulosis as well as multiple duodenal diverticula. No bowel obstruction or active inflammation. 3.  Aortic Atherosclerosis (ICD10-I70.0). Electronically Signed   By: Anner Crete M.D.   On: 02/25/2018 21:18    EKG: Independently reviewed.  A. fib rate controlled.  Assessment/Plan Active Problems:   Hypothyroidism   Essential hypertension   Atrial fibrillation (HCC)   Sepsis ([place])    1. Sepsis likely from pyelonephritis with obstructive distal ureteral stone -discussed with Dr. Karsten Ro.  Planning to place stent for the obstruction.  Patient is on vancomycin and ceftriaxone.  As per urologist patient's recent urine cultures grew E. coli.  Follow blood cultures urine cultures continue hydration keep patient n.p.o. Patient's last dose of apixaban was this morning. 2. Atrial fibrillation -last dose of apixaban this morning.  Will hold apixaban in anticipation of procedure.  Patient is on Cardizem and metoprolol which is on hold as patient present with hypotension.  If patient remains to be stable will restart medications p.o. or IV. 3. Hypothyroidism on Synthroid. 4. Hypertension -holding ARB Cardizem and metoprolol due to sepsis with hypotension on presentation.  Restart when stable. 5. History of diastolic CHF last EF measured was in May 2017 showing EF of 50 to 55%.  Holding Lasix due to sepsis.   DVT prophylaxis: SCDs. Code Status: DO NOT INTUBATE. Family Communication: Patient's daughter and granddaughter. Disposition Plan: Home. Consults called: Urologist. Admission status: Inpatient.   Rise Patience MD [facility] Pager [phone].  If 7PM-7AM, please contact night-coverage [email or web address] Password [email or web address]  02/25/2018, 11:21 PM

## 2018-02-25 NOTE — ED Provider Notes (Signed)
Patient placed in Quick Look pathway, seen and evaluated   Chief Complaint: abdominal pain, fever  HPI: [name] Case is a 84 y.o. female with hx of AF, Cancer, Cardiomegaly, CHF, COPD, DM, Diverticulosis, GERD, Kidney stones, Cystoscopy with ureteroscopy and stent placement bilateral. Pt c/o right sided flank/abdominal pain. Pt had stents removed from ureter, s/p renal tumor removal. C/o fevers at home, afebrile in triage, took tylenol PTA. patient reports recent removal of stents and pain since then.    ROS: GU: pain with urination  General: fever  GI: nausea  Physical Exam:  BP 123/64 (BP Location: Right Arm)   Pulse 77   Temp 99.9 F (37.7 C) (Oral)   Resp 14   LMP  (LMP Unknown)   SpO2 95%    Gen: No distress  Neuro: Awake and Alert  Skin: Warm and dry  Abdomen: tender with palpation epigastric area, right CVA tenderness     Initiation of care has begun. The patient has been counseled on the process, plan, and necessity for staying for the completion/evaluation, and the remainder of the medical screening examination    Ashley Murrain, NP 02/25/18 1731    Tanna Furry, MD 02/26/18 [phone]

## 2018-02-25 NOTE — ED Notes (Signed)
[facility] called for transport to [facility]

## 2018-02-25 NOTE — ED Notes (Signed)
Patient currently trying to get out of bed, unable to stand on her own. Repeatedly told patient to stay in the bed and offered purewick and bedpan- patient refusing both at this time. Patient wants to leave, states "she has been here for the past 2 hours and you haven't done anything yet" explained to patient we have done a CXR, and drawn labs and a dr has seen her. Patient still choosing to leave at this time. MD notified.

## 2018-02-25 NOTE — ED Notes (Signed)
No working signature pads on floor. Pt signed paper transfer consent

## 2018-02-25 NOTE — ED Notes (Signed)
Report to Velva Harman RN @ [address]. [facility] present for pt transfer

## 2018-02-25 NOTE — ED Notes (Signed)
Dr. Tucker at bedside

## 2018-02-25 NOTE — Progress Notes (Signed)
Pharmacy Antibiotic Note  [name] Case is a 84 y.o. female admitted on 02/25/2018 with UTI.  Pharmacy has been consulted for vancomycin dosing. Pt is afebrile and WBC is slightly elevated at 11.5. SCr is WNL.   Plan: Vancomycin 1500mg  IV Q24H F/u renal fxn, C&S, clinical status and trough at  [facility] Continue ceftriaxone?  Height: 5\' 1"  (154.9 cm) Weight: 214 lb 15.2 oz (97.5 kg) IBW/kg (Calculated) : 47.8  Temp (24hrs), Avg:99.9 F (37.7 C), Min:99.9 F (37.7 C), Max:99.9 F (37.7 C)  Recent Labs  Lab 02/25/18 1737 02/25/18 1755 02/25/18 1954  WBC 11.5*  --   --   CREATININE 0.92  --   --   LATICACIDVEN  --  1.83 1.78    Estimated Creatinine Clearance: 48.6 mL/min (by C-G formula based on SCr of 0.92 mg/dL).    Allergies  Allergen Reactions  . Codeine Hives and Rash    In cough syrup preparations    Antimicrobials this admission: Vanc 6/27>> CTX x 1 6/27  Dose adjustments this admission: N/A  Microbiology results: Pending  Thank you for allowing pharmacy to be a part of this patient's care.  Lameisha Schuenemann, Rande Lawman 02/25/2018 9:12 PM

## 2018-02-26 ENCOUNTER — Inpatient Hospital Stay (HOSPITAL_COMMUNITY): Payer: Medicare Other | Admitting: Certified Registered"

## 2018-02-26 ENCOUNTER — Inpatient Hospital Stay (HOSPITAL_COMMUNITY): Payer: Medicare Other

## 2018-02-26 ENCOUNTER — Encounter: Payer: Self-pay | Admitting: Genetic Counselor

## 2018-02-26 ENCOUNTER — Encounter (HOSPITAL_COMMUNITY): Payer: Self-pay | Admitting: Certified Registered"

## 2018-02-26 ENCOUNTER — Encounter (HOSPITAL_COMMUNITY)
Admission: EM | Disposition: A | Discharge status: Home Health | Payer: Self-pay | Source: Home / Self Care | Attending: Internal Medicine

## 2018-02-26 ENCOUNTER — Telehealth: Payer: Self-pay | Admitting: Genetic Counselor

## 2018-02-26 DIAGNOSIS — N135 Crossing vessel and stricture of ureter without hydronephrosis: Secondary | ICD-10-CM | POA: Diagnosis not present

## 2018-02-26 DIAGNOSIS — I4891 Unspecified atrial fibrillation: Secondary | ICD-10-CM

## 2018-02-26 DIAGNOSIS — B9620 Unspecified Escherichia coli [E. coli] as the cause of diseases classified elsewhere: Secondary | ICD-10-CM | POA: Diagnosis present

## 2018-02-26 DIAGNOSIS — K219 Gastro-esophageal reflux disease without esophagitis: Secondary | ICD-10-CM

## 2018-02-26 DIAGNOSIS — R7881 Bacteremia: Secondary | ICD-10-CM | POA: Diagnosis present

## 2018-02-26 DIAGNOSIS — N390 Urinary tract infection, site not specified: Secondary | ICD-10-CM

## 2018-02-26 DIAGNOSIS — N10 Acute tubulo-interstitial nephritis: Secondary | ICD-10-CM

## 2018-02-26 DIAGNOSIS — E039 Hypothyroidism, unspecified: Secondary | ICD-10-CM

## 2018-02-26 DIAGNOSIS — E785 Hyperlipidemia, unspecified: Secondary | ICD-10-CM | POA: Diagnosis not present

## 2018-02-26 DIAGNOSIS — R319 Hematuria, unspecified: Secondary | ICD-10-CM

## 2018-02-26 DIAGNOSIS — I10 Essential (primary) hypertension: Secondary | ICD-10-CM

## 2018-02-26 DIAGNOSIS — A4151 Sepsis due to Escherichia coli [E. coli]: Principal | ICD-10-CM

## 2018-02-26 DIAGNOSIS — A419 Sepsis, unspecified organism: Secondary | ICD-10-CM | POA: Diagnosis not present

## 2018-02-26 DIAGNOSIS — J9612 Chronic respiratory failure with hypercapnia: Secondary | ICD-10-CM

## 2018-02-26 DIAGNOSIS — N136 Pyonephrosis: Secondary | ICD-10-CM

## 2018-02-26 DIAGNOSIS — E119 Type 2 diabetes mellitus without complications: Secondary | ICD-10-CM

## 2018-02-26 HISTORY — PX: CYSTOSCOPY W/ URETERAL STENT PLACEMENT: SHX1429

## 2018-02-26 LAB — FERRITIN: Ferritin: 99 ng/mL (ref 11–307)

## 2018-02-26 LAB — CBC WITH DIFFERENTIAL/PLATELET
BASOS ABS: 0 10*3/uL (ref 0.0–0.1)
BASOS PCT: 0 %
EOS ABS: 0.1 10*3/uL (ref 0.0–0.7)
Eosinophils Relative: 1 %
HCT: 32.9 % — ABNORMAL LOW (ref 36.0–46.0)
HEMOGLOBIN: 10.5 g/dL — AB (ref 12.0–15.0)
Lymphocytes Relative: 10 %
Lymphs Abs: 1.1 10*3/uL (ref 0.7–4.0)
MCH: 31.1 pg (ref 26.0–34.0)
MCHC: 31.9 g/dL (ref 30.0–36.0)
MCV: 97.3 fL (ref 78.0–100.0)
MONO ABS: 1.2 10*3/uL — AB (ref 0.1–1.0)
Monocytes Relative: 10 %
NEUTROS ABS: 9.2 10*3/uL — AB (ref 1.7–7.7)
NEUTROS PCT: 79 %
Platelets: 199 10*3/uL (ref 150–400)
RBC: 3.38 MIL/uL — ABNORMAL LOW (ref 3.87–5.11)
RDW: 14.4 % (ref 11.5–15.5)
WBC: 11.7 10*3/uL — ABNORMAL HIGH (ref 4.0–10.5)

## 2018-02-26 LAB — PROTIME-INR
INR: 1.42
PROTHROMBIN TIME: 17.2 s — AB (ref 11.4–15.2)

## 2018-02-26 LAB — BLOOD CULTURE ID PANEL (REFLEXED)
Acinetobacter baumannii: NOT DETECTED
CANDIDA ALBICANS: NOT DETECTED
CANDIDA GLABRATA: NOT DETECTED
Candida krusei: NOT DETECTED
Candida parapsilosis: NOT DETECTED
Candida tropicalis: NOT DETECTED
Carbapenem resistance: NOT DETECTED
ESCHERICHIA COLI: DETECTED — AB
Enterobacter cloacae complex: NOT DETECTED
Enterobacteriaceae species: DETECTED — AB
Enterococcus species: NOT DETECTED
Haemophilus influenzae: NOT DETECTED
Klebsiella oxytoca: NOT DETECTED
Klebsiella pneumoniae: NOT DETECTED
Listeria monocytogenes: NOT DETECTED
NEISSERIA MENINGITIDIS: NOT DETECTED
PROTEUS SPECIES: NOT DETECTED
PSEUDOMONAS AERUGINOSA: NOT DETECTED
STREPTOCOCCUS AGALACTIAE: NOT DETECTED
STREPTOCOCCUS PNEUMONIAE: NOT DETECTED
STREPTOCOCCUS PYOGENES: NOT DETECTED
STREPTOCOCCUS SPECIES: NOT DETECTED
Serratia marcescens: NOT DETECTED
Staphylococcus aureus (BCID): NOT DETECTED
Staphylococcus species: NOT DETECTED

## 2018-02-26 LAB — GLUCOSE, CAPILLARY
GLUCOSE-CAPILLARY: 106 mg/dL — AB (ref 70–99)
GLUCOSE-CAPILLARY: 133 mg/dL — AB (ref 70–99)
GLUCOSE-CAPILLARY: 140 mg/dL — AB (ref 70–99)
Glucose-Capillary: 106 mg/dL — ABNORMAL HIGH (ref 70–99)
Glucose-Capillary: 107 mg/dL — ABNORMAL HIGH (ref 70–99)
Glucose-Capillary: 157 mg/dL — ABNORMAL HIGH (ref 70–99)

## 2018-02-26 LAB — PROCALCITONIN: Procalcitonin: <0.1 ng/mL

## 2018-02-26 LAB — COMPREHENSIVE METABOLIC PANEL
ALBUMIN: 2.6 g/dL — AB (ref 3.5–5.0)
ALT: 16 U/L (ref 0–44)
ANION GAP: 7 (ref 5–15)
AST: 14 U/L — AB (ref 15–41)
Alkaline Phosphatase: 61 U/L (ref 38–126)
BUN: 13 mg/dL (ref 8–23)
CHLORIDE: 101 mmol/L (ref 98–111)
CO2: 32 mmol/L (ref 22–32)
Calcium: 8.1 mg/dL — ABNORMAL LOW (ref 8.9–10.3)
Creatinine, Ser: 0.89 mg/dL (ref 0.44–1.00)
GFR calc Af Amer: >60 mL/min (ref >60)
GFR calc non Af Amer: 58 mL/min — ABNORMAL LOW (ref >60)
Glucose, Bld: 121 mg/dL — ABNORMAL HIGH (ref 70–99)
POTASSIUM: 3.1 mmol/L — AB (ref 3.5–5.1)
SODIUM: 140 mmol/L (ref 135–145)
Total Bilirubin: 0.7 mg/dL (ref 0.3–1.2)
Total Protein: 5.6 g/dL — ABNORMAL LOW (ref 6.5–8.1)

## 2018-02-26 LAB — TYPE AND SCREEN
ABO/RH(D): O POS
Antibody Screen: NEGATIVE

## 2018-02-26 LAB — MAGNESIUM: MAGNESIUM: 1.2 mg/dL — AB (ref 1.7–2.4)

## 2018-02-26 LAB — FOLATE: FOLATE: 9.4 ng/mL (ref >5.9)

## 2018-02-26 LAB — APTT: APTT: 41 s — AB (ref 24–36)

## 2018-02-26 LAB — IRON AND TIBC
IRON: 9 ug/dL — AB (ref 28–170)
Saturation Ratios: 4 % — ABNORMAL LOW (ref 10.4–31.8)
TIBC: 244 ug/dL — AB (ref 250–450)
UIBC: 235 ug/dL

## 2018-02-26 LAB — ABO/RH: ABO/RH(D): O POS

## 2018-02-26 LAB — LACTIC ACID, PLASMA: Lactic Acid, Venous: 0.9 mmol/L (ref 0.5–1.9)

## 2018-02-26 LAB — VITAMIN B12: Vitamin B-12: 228 pg/mL (ref 180–914)

## 2018-02-26 LAB — MRSA PCR SCREENING: MRSA BY PCR: NEGATIVE

## 2018-02-26 SURGERY — CYSTOSCOPY, WITH RETROGRADE PYELOGRAM AND URETERAL STENT INSERTION
Anesthesia: General | Site: Ureter | Laterality: Right

## 2018-02-26 MED ORDER — MEPERIDINE HCL 50 MG/ML IJ SOLN: 6.2500 mg | INTRAMUSCULAR | Status: DC | PRN

## 2018-02-26 MED ORDER — ONDANSETRON HCL 4 MG/2ML IJ SOLN
INTRAMUSCULAR | Status: DC | PRN
  Administered 2018-02-26: 4 mg via INTRAVENOUS

## 2018-02-26 MED ORDER — ATORVASTATIN CALCIUM 20 MG PO TABS
20.0000 mg | ORAL_TABLET | Freq: Every day | ORAL | Status: DC
  Administered 2018-02-26 – 2018-02-28 (×3): 20 mg via ORAL
  Filled 2018-02-26: qty 2
  Filled 2018-02-26 (×2): qty 1

## 2018-02-26 MED ORDER — ONDANSETRON HCL 4 MG/2ML IJ SOLN
INTRAMUSCULAR | Status: AC
  Filled 2018-02-26: qty 2

## 2018-02-26 MED ORDER — LEVOTHYROXINE SODIUM 100 MCG PO TABS
100.0000 ug | ORAL_TABLET | Freq: Every day | ORAL | Status: DC
  Administered 2018-02-26 – 2018-03-01 (×4): 100 ug via ORAL
  Filled 2018-02-26 (×4): qty 1

## 2018-02-26 MED ORDER — IOHEXOL 300 MG/ML  SOLN
INTRAMUSCULAR | Status: DC | PRN
  Administered 2018-02-26: 10 mL

## 2018-02-26 MED ORDER — PHENYLEPHRINE 40 MCG/ML (10ML) SYRINGE FOR IV PUSH (FOR BLOOD PRESSURE SUPPORT)
PREFILLED_SYRINGE | INTRAVENOUS | Status: DC | PRN
  Administered 2018-02-26: 80 ug via INTRAVENOUS
  Administered 2018-02-26: 120 ug via INTRAVENOUS

## 2018-02-26 MED ORDER — POTASSIUM CHLORIDE CRYS ER 20 MEQ PO TBCR
40.0000 meq | EXTENDED_RELEASE_TABLET | ORAL | Status: AC
  Administered 2018-02-26 (×2): 40 meq via ORAL
  Filled 2018-02-26 (×2): qty 2

## 2018-02-26 MED ORDER — ONDANSETRON HCL 4 MG/2ML IJ SOLN: 4.0000 mg | Freq: Once | INTRAMUSCULAR | Status: DC | PRN

## 2018-02-26 MED ORDER — FENTANYL CITRATE (PF) 100 MCG/2ML IJ SOLN: 25.0000 ug | INTRAMUSCULAR | Status: DC | PRN

## 2018-02-26 MED ORDER — ROCURONIUM BROMIDE 100 MG/10ML IV SOLN
INTRAVENOUS | Status: AC
  Filled 2018-02-26: qty 2

## 2018-02-26 MED ORDER — FENTANYL CITRATE (PF) 100 MCG/2ML IJ SOLN
INTRAMUSCULAR | Status: AC
  Filled 2018-02-26: qty 2

## 2018-02-26 MED ORDER — SODIUM CHLORIDE 0.9 % IR SOLN
Status: DC | PRN
  Administered 2018-02-26: 3000 mL

## 2018-02-26 MED ORDER — METOPROLOL SUCCINATE ER 50 MG PO TB24
50.0000 mg | ORAL_TABLET | Freq: Every day | ORAL | Status: DC
  Administered 2018-02-26 – 2018-03-01 (×4): 50 mg via ORAL
  Filled 2018-02-26 (×2): qty 2
  Filled 2018-02-26 (×2): qty 1

## 2018-02-26 MED ORDER — LIDOCAINE 2% (20 MG/ML) 5 ML SYRINGE
INTRAMUSCULAR | Status: AC
  Filled 2018-02-26: qty 5

## 2018-02-26 MED ORDER — PROPOFOL 10 MG/ML IV BOLUS
INTRAVENOUS | Status: AC
  Filled 2018-02-26: qty 20

## 2018-02-26 MED ORDER — PROPOFOL 10 MG/ML IV BOLUS
INTRAVENOUS | Status: DC | PRN
  Administered 2018-02-26: 150 mg via INTRAVENOUS

## 2018-02-26 MED ORDER — FENTANYL CITRATE (PF) 100 MCG/2ML IJ SOLN
INTRAMUSCULAR | Status: DC | PRN
  Administered 2018-02-26: 25 ug via INTRAVENOUS

## 2018-02-26 MED ORDER — LIDOCAINE 2% (20 MG/ML) 5 ML SYRINGE
INTRAMUSCULAR | Status: DC | PRN
  Administered 2018-02-26: 80 mg via INTRAVENOUS

## 2018-02-26 MED ORDER — FAMOTIDINE 20 MG PO TABS
20.0000 mg | ORAL_TABLET | Freq: Every day | ORAL | Status: DC
  Administered 2018-02-26 – 2018-03-01 (×4): 20 mg via ORAL
  Filled 2018-02-26 (×4): qty 1

## 2018-02-26 MED ORDER — INSULIN ASPART 100 UNIT/ML ~~LOC~~ SOLN
0.0000 [IU] | Freq: Three times a day (TID) | SUBCUTANEOUS | Status: DC
  Administered 2018-02-26 – 2018-02-27 (×2): 2 [IU] via SUBCUTANEOUS
  Administered 2018-02-28: 3 [IU] via SUBCUTANEOUS
  Administered 2018-03-01: 2 [IU] via SUBCUTANEOUS

## 2018-02-26 MED ORDER — PHENYLEPHRINE 40 MCG/ML (10ML) SYRINGE FOR IV PUSH (FOR BLOOD PRESSURE SUPPORT)
PREFILLED_SYRINGE | INTRAVENOUS | Status: AC
  Filled 2018-02-26: qty 10

## 2018-02-26 MED ORDER — MAGNESIUM SULFATE 4 GM/100ML IV SOLN
4.0000 g | Freq: Once | INTRAVENOUS | Status: AC
  Administered 2018-02-26: 4 g via INTRAVENOUS
  Filled 2018-02-26: qty 100

## 2018-02-26 SURGICAL SUPPLY — 13 items
BAG URO CATCHER STRL LF (MISCELLANEOUS) ×2 IMPLANT
CATH INTERMIT  6FR 70CM (CATHETERS) ×2 IMPLANT
CLOTH BEACON ORANGE TIMEOUT ST (SAFETY) ×2 IMPLANT
COVER FOOTSWITCH UNIV (MISCELLANEOUS) IMPLANT
COVER SURGICAL LIGHT HANDLE (MISCELLANEOUS) ×2 IMPLANT
GLOVE BIOGEL M 8.0 STRL (GLOVE) ×2 IMPLANT
GOWN STRL REUS W/ TWL XL LVL3 (GOWN DISPOSABLE) ×1 IMPLANT
GOWN STRL REUS W/TWL XL LVL3 (GOWN DISPOSABLE) ×2
GUIDEWIRE STR DUAL SENSOR (WIRE) ×2 IMPLANT
MANIFOLD NEPTUNE II (INSTRUMENTS) ×2 IMPLANT
PACK CYSTO (CUSTOM PROCEDURE TRAY) ×2 IMPLANT
STENT URET 6FRX24 CONTOUR (STENTS) ×1 IMPLANT
TUBING CONNECTING 10 (TUBING) ×2 IMPLANT

## 2018-02-26 NOTE — Anesthesia Preprocedure Evaluation (Addendum)
Anesthesia Evaluation  Patient identified by MRN, date of birth, ID band Patient awake    Reviewed: Allergy & Precautions, NPO status , Patient's Chart, lab work & pertinent test results, reviewed documented beta blocker date and time   History of Anesthesia Complications (+) PONV and history of anesthetic complications  Airway Mallampati: I  TM Distance: >3 FB Neck ROM: Full    Dental  (+) Upper Dentures, Lower Dentures   Pulmonary COPD,    Pulmonary exam normal breath sounds clear to auscultation       Cardiovascular hypertension, Pt. on medications Normal cardiovascular exam+ dysrhythmias Atrial Fibrillation  Rhythm:Regular Rate:Normal     Neuro/Psych negative neurological ROS  negative psych ROS   GI/Hepatic GERD  Medicated and Controlled,  Endo/Other  diabetes, Type 2, Oral Hypoglycemic AgentsMorbid obesity  Renal/GU      Musculoskeletal   Abdominal (+) + obese,   Peds  Hematology   Anesthesia Other Findings [name] Case  ECHO COMPLETE WO IMAGING ENHANCING AGENT  Order# [id number]  Reading physician: Skeet Latch, MD Ordering physician: Rise Patience, MD Study date: 01/09/16 Study Result   Result status: Final result                             *[facility]*                         [address],  [address]                            [phone]  ------------------------------------------------------------------- Transthoracic Echocardiography  Patient:    [name] Case, [name] Case MR #:       [id number] Study Date: 01/09/2016 Gender:     F Age:        84 Height:     160 cm Weight:     95.9 kg BSA:        2.11 m^2 Pt. Status: Room:       [place], [place], [address]  REFERRING    [place], [place], [place] [id number]  SONOGRAPHER  Joanie Coddington, RDCS  PERFORMING   [facility], Inpatient  cc:  ------------------------------------------------------------------- LV EF: 50% -   55%     Reproductive/Obstetrics                           Anesthesia Physical  Anesthesia Plan  ASA: III  Anesthesia Plan: General   Post-op Pain Management:    Induction:   PONV Risk Score and Plan: 4 or greater and Ondansetron and Treatment may vary due to age or medical condition  Airway Management Planned: LMA  Additional Equipment:   Intra-op Plan:   Post-operative Plan: Extubation in OR  Informed Consent: I have reviewed the patients History and Physical, chart, labs and discussed the procedure including the risks, benefits and alternatives for the proposed anesthesia with the patient or authorized representative who has indicated his/her understanding and acceptance.  Plan Discussed with: CRNA and Surgeon  Anesthesia Plan Comments:         Anesthesia Quick Evaluation

## 2018-02-26 NOTE — Consult Note (Signed)
Urology Consult  CC: Referring physician: Rise Patience, MD Reason for referral: Right hydronephrosis with infected urine  Impression/Assessment: Right hydronephrosis with UTI: [name] Case does have some right hydronephrosis.  Her CT scan revealed what is described by the radiologist as a 7 mm distal ureteral stone which I don't believe is the case.  I find it hard to believe that [name] Case had no stone on her CT scan on 01/07/18 or at the time of ureteroscopic treatment of her renal pelvic tumor and now has developed a stone 7 mm in size in that short period of time.  It is my opinion that [name] Case does have some hydronephrosis that looks like it secondary to some material of increased density likely a blood clot.  [name] Case has been having hematuria.  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  Right renal cyst: This is of no clinical significance.   Plan:  1.  Urgent right ureteral stent placement  2.  [name] Case is currently on ceftriaxone and vancomycin. Her organism was sensitive to ceftriaxone.  The vancomycin can be discontinued.   History of Present Illness: [name] Case is an 84 year old female patient of Dr. Louis Meckel who was found to have bilateral collecting system lesions at the time of retrograde pyelogram and right ureteral stone removal.  On 01/29/18 [name] Case underwent bilateral ureteroscopy and resection of her bilateral renal pelvic masses is staged procedure with the second stage occurring on 02/10/18.  Stents were left in place and were removed on 02/23/18. A urine culture done on 02/23/18 at the time of her stent removal in the office was positive for E. coli sensitive to all antibiotics except Levaquin and Cipro. A CT scan on 01/17/18 revealed no evidence of right renal calculi or ureteral stones.  There were no ureteral stones or renal stones noted ureteroscopically at the time of her 2 ureteroscopies earlier this month either.  [name] Case presented to the emergency room  having developed right flank pain with associated fever, nausea and anorexia.  The pain was improved by Tylenol and had resolved when [name] Case was seen in the ER.  [name] Case indicated that it radiated into the right inguinal region.  Past Medical History:  Diagnosis Date  . Allergy   . Arthritis   . Atrial fibrillation ([place])   . Bronchitis   . Cancer ([place])    Bilateral upper tract transitional cell carcinoma  . Cardiomegaly   . CHF (congestive heart failure) ([place])   . Complication of anesthesia    Difficult to arouse  . COPD (chronic obstructive pulmonary disease) ([facility])    borderline   . Diabetes mellitus without complication ([place])    Controlled by diet and exercise  . Diverticulosis of sigmoid colon   . Fatty liver   . GERD (gastroesophageal reflux disease)   . History of hiatal hernia 01/20/2015   Small sliding, noted on CT  . History of kidney stones   . Hyperlipidemia   . Hypertension   . Hypothyroidism   . PONV (postoperative nausea and vomiting)   . Supraumbilical hernia 08/67/6195   Small noted on CT  . Thyroid disease   . UTI (urinary tract infection)    Past Surgical History:  Procedure Laterality Date  . ABDOMINAL HYSTERECTOMY    . APPENDECTOMY    . CHOLECYSTECTOMY    . COLONOSCOPY  07/16/2009  . CYSTOSCOPY W/ URETERAL STENT PLACEMENT Right 12/04/2017   Procedure: CYSTOSCOPY WITH RETROGRADE PYELOGRAM/URETERAL STENT PLACEMENT;  Surgeon: Festus Aloe, MD;  Location: [facility];  Service: Urology;  Laterality: Right;  . CYSTOSCOPY WITH URETEROSCOPY AND STENT PLACEMENT Bilateral 01/29/2018   Procedure: BILATERAL URETEROSCOPY AND TUMOR RESECTION  WITH LASER RIGHT STENT EXCHANGE LEFT STENT PLACEMENT;  Surgeon: Ardis Hughs, MD;  Location: [facility];  Service: Urology;  Laterality: Bilateral;  . CYSTOSCOPY WITH URETEROSCOPY AND STENT PLACEMENT Bilateral 02/10/2018   Procedure: BILATERAL URETEROSCOPY AND TUMOR EXCISION BILATERAL STENT EXCHANGE;  Surgeon: Ardis Hughs, MD;   Location: [facility];  Service: Urology;  Laterality: Bilateral;  . CYSTOSCOPY/RETROGRADE/URETEROSCOPY Bilateral 12/24/2017   Procedure: BILATERAL URETEROSCOPY, RIGHT STENT EXCHANGE, STONE EXTRACTION WITH BASKET BILATERAL RETROGRADE PYELOGRAM, BIOPSY OF RIGHT UPPER POLE TUMOR,  BILATERAL RENAL PELVIS FLUID SENT FOR CYTOLOGY;  Surgeon: Ardis Hughs, MD;  Location: [facility];  Service: Urology;  Laterality: Bilateral;  . KNEE SURGERY Right   . KNEE SURGERY Left    torn ligament repair  . UPPER GI ENDOSCOPY      Medications:  Scheduled:  Continuous: . cefTRIAXone (ROCEPHIN)  IV 2 g (02/26/18 0541)  . lactated ringers 125 mL/hr at 02/26/18 0044  . vancomycin Stopped (02/26/18 0034)    Allergies:  Allergies  Allergen Reactions  . Codeine Hives and Rash    In cough syrup preparations    Family History  Problem Relation Age of Onset  . Heart disease Mother   . Colon cancer Mother     Social History:  reports that [name] Case has never smoked. [name] Case has never used smokeless tobacco. [name] Case reports that [name] Case does not drink alcohol or use drugs.  Review of Systems (10 point): Pertinent items are noted in HPI. A comprehensive review of systems was negative except as noted above.  Physical Exam:  Vital signs in last 24 hours: Temp:  [99.9 F (37.7 C)-102.7 F (39.3 C)] 99.9 F (37.7 C) (06/28 0000) Pulse Rate:  [77-132] 103 (06/28 0200) Resp:  [14-27] 22 (06/28 0200) BP: (108-128)/(49-103) 109/90 (06/28 0000) SpO2:  [85 %-99 %] 96 % (06/28 0200) Weight:  [97.5 kg (214 lb 15.2 oz)] 97.5 kg (214 lb 15.2 oz) (06/27 2100) General appearance: alert and appears stated age Head: Normocephalic, without obvious abnormality, atraumatic Eyes: conjunctivae/corneas clear. EOM's intact.  Oropharynx: moist mucous membranes Neck: supple, symmetrical, trachea midline Resp: normal respiratory effort Cardio: regular rate and rhythm Back: symmetric, no curvature. ROM normal. No CVA tenderness. GI: soft,  non-tender; bowel sounds normal; no masses,  no organomegaly Extremities: extremities normal, atraumatic, no cyanosis or edema Skin: Skin color normal. No visible rashes or lesions Neurologic: Grossly normal  Laboratory Data:  Recent Labs    02/25/18 1737  WBC 11.5*  HGB 11.7*  HCT 37.9   BMET Recent Labs    02/25/18 1737  NA 138  K 3.3*  CL 100  CO2 27  GLUCOSE 129*  BUN 10  CREATININE 0.92  CALCIUM 8.7*   Recent Labs    02/25/18 1737  INR 1.45   No results for input(s): LABURIN in the last 72 hours. Results for orders placed or performed during the hospital encounter of 02/25/18  MRSA PCR Screening     Status: None   Collection Time: 02/26/18 12:05 AM  Result Value Ref Range Status   MRSA by PCR NEGATIVE NEGATIVE Final    Comment:        The GeneXpert MRSA Assay (FDA approved for NASAL specimens only), is one component of a comprehensive MRSA colonization surveillance program. It is not intended to diagnose MRSA infection nor  to guide or monitor treatment for MRSA infections. Performed at [facility], [place] [address] [place], [place] [address]    Creatinine: Recent Labs    02/25/18 1737  CREATININE 0.92    Imaging: Dg Chest 2 View  Result Date: 02/25/2018 CLINICAL DATA:  Right-sided flank pain and abdominal pain.  Fever. EXAM: CHEST - 2 VIEW COMPARISON:  10/26/2017 and 12/06/2017 FINDINGS: Lungs are adequately inflated with minimal prominence of the perihilar markings. No focal lobar consolidation or effusion. Mild stable cardiomegaly. Remainder of the exam is unchanged. IMPRESSION: Stable cardiomegaly with suggestion of minimal vascular congestion. Electronically Signed   By: Marin Olp M.D.   On: 02/25/2018 18:06   Ct Abdomen Pelvis W Contrast  Result Date: 02/25/2018 CLINICAL DATA:  84 year old female with abdominal pain. EXAM: CT ABDOMEN AND PELVIS WITH CONTRAST TECHNIQUE: Multidetector CT imaging of the abdomen and pelvis  was performed using the standard protocol following bolus administration of intravenous contrast. CONTRAST:  192mL OMNIPAQUE IOHEXOL 300 MG/ML  SOLN COMPARISON:  Abdominal CT dated 01/07/2018 FINDINGS: Lower chest: Minimal bibasilar scarring. The visualized lung bases are otherwise clear. There is mild cardiomegaly. No intra-abdominal free air or free fluid. Hepatobiliary: Cholecystectomy. No intrahepatic biliary ductal dilatation. The liver is unremarkable. Pancreas: Fatty infiltration of the pancreas. No dilatation of the main pancreatic duct. Spleen: Normal in size without focal abnormality. Adrenals/Urinary Tract: The adrenal glands are unremarkable. There is a 7 mm stone in the distal right ureter with mild right hydronephrosis. There is mild fullness of the left ureter. No definite stone identified along the course of the left ureter. There is stranding of the right perinephric and bilateral periureteric fat. Correlation with urinalysis recommended to exclude cystitis. Right renal 5 cm upper pole cyst. Small amount of air within the urinary bladder likely related to recent instrumentation. Clinical correlation is recommended. Stomach/Bowel: There multiple duodenal diverticula measuring up to 3 cm without active inflammatory changes. There is sigmoid diverticulosis and scattered colonic diverticula without active inflammatory changes. There is no bowel obstruction or active inflammation. Appendectomy. Vascular/Lymphatic: Moderate aortoiliac atherosclerotic disease. No portal venous gas. There is no adenopathy. Reproductive: Hysterectomy.  No pelvic mass. Other: Small fat containing umbilical hernia.  No fluid collection. Musculoskeletal: Bilateral L5 pars defects. No acute osseous pathology. There are degenerative changes of the spine with disc desiccation. IMPRESSION: 1. A 7 mm distal right ureteral calculus with mild right hydronephrosis. Correlation with urinalysis recommended to exclude superimposed UTI. 2.  Colonic diverticulosis as well as multiple duodenal diverticula. No bowel obstruction or active inflammation. 3.  Aortic Atherosclerosis (ICD10-I70.0). Electronically Signed   By: Anner Crete M.D.   On: 02/25/2018 21:18    I performed an independent review of her CT scan images.   Devlon Dosher C 02/26/2018, 4:04 AM

## 2018-02-26 NOTE — Progress Notes (Signed)
PHARMACY - PHYSICIAN COMMUNICATION CRITICAL VALUE ALERT - BLOOD CULTURE IDENTIFICATION (BCID)  [name] Case is an 84 y.o. female who presented to [facility] on 02/25/2018 with a chief complaint of flank pain (h/o transitional cell carcinoma of BL uteter s/p resection and stents removed 2 days ago).  Patient taken to OR 6/28 for double JJ stent for 19mm stone  Assessment:  BCID with E. Coli from urinary source.  Per urology note, a UCx from 6/25 revealed E. Coli (R to FQ only)   Name of physician (or Provider) Contacted: [place]ville Silos  Current antibiotics: Vancomycin and ceftriaxone  Changes to prescribed antibiotics recommended:  Recommendations accepted by provider (stopped vancomycin)  Results for orders placed or performed during the hospital encounter of 02/25/18  Blood Culture ID Panel (Reflexed) (Collected: 02/25/2018  5:37 PM)  Result Value Ref Range   Enterococcus species NOT DETECTED NOT DETECTED   Listeria monocytogenes NOT DETECTED NOT DETECTED   Staphylococcus species NOT DETECTED NOT DETECTED   Staphylococcus aureus NOT DETECTED NOT DETECTED   Streptococcus species NOT DETECTED NOT DETECTED   Streptococcus agalactiae NOT DETECTED NOT DETECTED   Streptococcus pneumoniae NOT DETECTED NOT DETECTED   Streptococcus pyogenes NOT DETECTED NOT DETECTED   Acinetobacter baumannii NOT DETECTED NOT DETECTED   Enterobacteriaceae species DETECTED (A) NOT DETECTED   Enterobacter cloacae complex NOT DETECTED NOT DETECTED   Escherichia coli DETECTED (A) NOT DETECTED   Klebsiella oxytoca NOT DETECTED NOT DETECTED   Klebsiella pneumoniae NOT DETECTED NOT DETECTED   Proteus species NOT DETECTED NOT DETECTED   Serratia marcescens NOT DETECTED NOT DETECTED   Carbapenem resistance NOT DETECTED NOT DETECTED   Haemophilus influenzae NOT DETECTED NOT DETECTED   Neisseria meningitidis NOT DETECTED NOT DETECTED   Pseudomonas aeruginosa NOT DETECTED NOT DETECTED   Candida albicans NOT DETECTED NOT  DETECTED   Candida glabrata NOT DETECTED NOT DETECTED   Candida krusei NOT DETECTED NOT DETECTED   Candida parapsilosis NOT DETECTED NOT DETECTED   Candida tropicalis NOT DETECTED NOT DETECTED    Doreene Eland, PharmD, BCPS.   Pager: [phone] 02/26/2018 10:42 AM

## 2018-02-26 NOTE — Op Note (Signed)
PATIENT:  [name] Case  PRE-OPERATIVE DIAGNOSIS: Right ureteral obstruction with sepsis  POST-OPERATIVE DIAGNOSIS: Same  PROCEDURE: 1.  Cystoscopy with right retrograde pyelogram including interpretation. 2.  Right double-J stent placement. 3.  Fluoroscopy less than 1 hour.  SURGEON:  Claybon Jabs  INDICATION: [name] Case is a 84 year old female who was admitted with fever and right hydronephrosis after undergoing upper tract transitional cell carcinoma treatment and stent removal 2 days prior to her admission.  A urine culture was performed at the time of her stent removal and grew E. coli.  She has what appears to be possible mild, early sepsis.  The radiologist read her CT scan as showing a 7 mm distal ureteral stone although this does not appear to be a stone but rather most likely blood clot.  She is brought to the operating room for urgent stent placement.  ANESTHESIA:  General  EBL: None  DRAINS: 6 French, 24 cm double-J stent in the right ureter (no string)  LOCAL MEDICATIONS USED:  None  SPECIMEN: None  Description of procedure: After informed consent the patient was taken to the operating room and placed on the table in a supine position. General anesthesia was then administered. Once fully anesthetized the patient was moved to the dorsal lithotomy position and the genitalia were sterilely prepped and draped in standard fashion. An official timeout was then performed.  A 23 French cystoscope was passed into the bladder.  The right ureteral orifice was identified and appeared to have some mild edema.  A 6 French open-ended ureteral catheter was then passed through the cystoscope and into the right UO.  Full-strength Omnipaque contrast was then injected in order to perform a right retrograde pyelogram.  Her retrograde pyelogram revealed the contrast only passed a short distance up the ureter.  There was no evidence of stone that could be seen on her fluoroscopic  evaluation.  I did not try to inject contrast with any force and therefore elected to rather proceed with stent placement.  A 0.038 inch sensor guidewire was passed through the open-ended ureteral catheter and up the right ureter without difficulty into the area the renal pelvis under direct fluoroscopic control.  Over the guidewire passed the double-J stent and as I remove the guidewire good curl was noted within the renal pelvis and bladder.  The bladder was drained, the cystoscope was removed and the patient was awakened and taken to the recovery room in stable and satisfactory condition.  She tolerated the procedure well with no intraoperative complications.  PLAN OF CARE: Discharge to home after PACU  PATIENT DISPOSITION:  PACU - hemodynamically stable.

## 2018-02-26 NOTE — Progress Notes (Addendum)
PROGRESS NOTE    [name] Case  ZOX:[id number] DOB: 1932/06/14 DOA: 02/25/2018 PCP: Hoyt Koch, MD    Brief Narrative:  [name] Case is a 84 y.o. 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  Has been feeling weak.  Had one episode of vomiting.  Denies any diarrhea.  Denies chest pain or shortness of breath.  ED Course: In the ER patient is found to be febrile tachycardic and was initially hypotensive.  Blood pressure improved with fluids.  UA is consistent with UTI.  CT abdomen pelvis shows right-sided hydronephrosis with distal ureteral stone.  On-call urologist Dr. Edwena Blow was consulted and patient is likely to go for stent placement and will be transferred to [facility].     Assessment & Plan:   Principal Problem:   Sepsis ([place]) Active Problems:   Pyohydronephrosis   Acute pyelonephritis   Hypothyroidism   Morbid obesity ([name] Case)   Essential hypertension   GERD   Atrial fibrillation ([name])   Type 2 diabetes mellitus without complication ([name])   Chronic respiratory failure with hypercapnia ([name])   Acute lower UTI  #1 sepsis secondary to pyelonephritis/pyohydronephrosis, UTI Patient was noted to have concerns for obstructive distal urethral stone noted on CT abdomen and pelvis that she presented with right flank pain radiating to the groin region. Status post cystoscopy with right retrograde pyelogram including interpretation, right double-J stent placement per Dr. Karsten Ro 02/26/2018.  On admission patient noted to have a fever with a temperature of 102.7, tachycardia with heart rates as high as 132, tachypnea with respiratory rates as high as 27, borderline blood pressure of 102/49, with a leukocytosis and meeting criteria for sepsis.  Patient has been pancultured results  pending.  Per urologist notes patient had urine cultures done on 02/23/2018 with cultures growing E. Coli, which was sensitive to all antibiotics except Levaquin and ciprofloxacin.  Patient with recent stent removal 02/23/2018 after she underwent bilateral ureteroscopy and resection of her bilateral renal pelvic masses with first stage on 01/29/2018 and second stage on 02/10/2018.  Preliminary blood cultures now positive for E. coli which may have likely seeded from the urine.  Continue IV vancomycin.  Continue IV Rocephin at 2 g daily.  IV fluids.  Follow.  2.  E. coli bacteremia Likely seeded from recent UTI 02/23/2018 with cultures positive for E. coli per urology.  Continue IV Rocephin.  Discontinue IV vancomycin.  3.  Atrial fibrillation Apixaban on hold for stent placement today.  Will defer resumption of anticoagulation to urology.  Patient was on Cardizem and metoprolol which were held due to hypotension.  Will resume once blood pressure improves.  4.  Hypothyroidism Resume home dose Synthroid.  5.  Hypertension Blood pressure on admission was low patient noted to have borderline hypotension and as such antihypertensive medications held.  Continue to hold ARB, Cardizem, metoprolol due to sepsis and hypotension.  Resume once blood pressure improved and stable.  6.  History of diastolic CHF Last EF was in May 2017 with EF of 50 to 55%.  Lasix on hold due to hypotension and sepsis.  Metoprolol and ARB also on hold.  Once blood pressure improves will resume cardiac medications.  7.  History of bilateral renal pelvic transitional cell Per urology has been managed endoscopically.  Per urology.  8.  Morbid obesity  9.  Diabetes mellitus type 2 Hemoglobin A1c was 6.3 on 01/19/2018.  CBG of 107 this morning.  Place on sliding scale insulin.  10 hypokalemia/hypomagnesemia Replete.  12.  Anemia Check an anemia panel.  Follow H&H.   DVT prophylaxis: SCDs Code Status: Partial Family  Communication: Updated patient and great granddaughter at bedside. Disposition Plan: Remain in stepdown unit.   Consultants:   Urology: Dr. Karsten Ro 02/26/2018  Procedures:   CT abdomen and pelvis 02/25/2018  Chest x-ray 02/25/2018  Cystoscopy with right retrograde pyelogram including interpretation, right double-J stent placement per Dr. Karsten Ro 02/26/2018  Antimicrobials:  IV Rocephin 02/25/2018  IV vancomycin 02/25/2018>>>>> 02/26/2018   Subjective: Just returned from the OR/postop.  Opens eyes to verbal stimuli.  Denies any chest pain no shortness of breath.  States right flank pain improved since admission.  Objective: Vitals:   02/26/18 0715 02/26/18 0730 02/26/18 0746 02/26/18 0800  BP: (!) 116/56 (!) 116/58 (!) 108/54   Pulse: 91 98 90   Resp: 16 19 16    Temp:  99.1 F (37.3 C) 99.1 F (37.3 C) 99 F (37.2 C)  TempSrc:    Oral  SpO2: 100% 97% 98%   Weight:      Height:        Intake/Output Summary (Last 24 hours) at 02/26/2018 0941 Last data filed at 02/26/2018 0657 Gross per 24 hour  Intake 2153.33 ml  Output 380 ml  Net 1773.33 ml   Filed Weights   02/25/18 2100  Weight: 97.5 kg (214 lb 15.2 oz)    Examination:  General exam: Appears calm and comfortable  Respiratory system: Clear to auscultation anterior lung fields. Respiratory effort normal. Cardiovascular system: S1 & S2 heard, RRR. No JVD, murmurs, rubs, gallops or clicks. No pedal edema. Gastrointestinal system: Abdomen is nondistended, soft and nontender. No organomegaly or masses felt. Normal bowel sounds heard. Central nervous system: Alert and oriented. No focal neurological deficits. Extremities: Symmetric 5 x 5 power. Skin: No rashes, lesions or ulcers Psychiatry: Judgement and insight appear normal. Mood & affect appropriate.     Data Reviewed: I have personally reviewed following labs and imaging studies  CBC: Recent Labs  Lab 02/25/18 1737 02/26/18 0823  WBC 11.5* 11.7*    NEUTROABS 9.2* 9.2*  HGB 11.7* 10.5*  HCT 37.9 32.9*  MCV 97.9 97.3  PLT 240 416   Basic Metabolic Panel: Recent Labs  Lab 02/25/18 1737 02/26/18 0823  NA 138 140  K 3.3* 3.1*  CL 100 101  CO2 27 32  GLUCOSE 129* 121*  BUN 10 13  CREATININE 0.92 0.89  CALCIUM 8.7* 8.1*  MG  --  1.2*   GFR: Estimated Creatinine Clearance: 50.3 mL/min (by C-G formula based on SCr of 0.89 mg/dL). Liver Function Tests: Recent Labs  Lab 02/25/18 1737 02/26/18 0823  AST 21 14*  ALT 19 16  ALKPHOS 74 61  BILITOT 0.8 0.7  PROT 6.2* 5.6*  ALBUMIN 3.3* 2.6*   No results for input(s): LIPASE, AMYLASE in the last 168 hours. No results for input(s): AMMONIA in the last 168 hours. Coagulation Profile: Recent Labs  Lab 02/25/18 1737 02/26/18 0823  INR 1.45 1.42   Cardiac Enzymes: No results for input(s): CKTOTAL, CKMB, CKMBINDEX, TROPONINI in the last 168 hours. BNP (last 3 results) No results for input(s): PROBNP in the last 8760 hours. HbA1C: No results for input(s): HGBA1C in the last 72 hours. CBG: Recent Labs  Lab 02/26/18 0027 02/26/18 0549 02/26/18  0700  GLUCAP 140* 106* 107*   Lipid Profile: No results for input(s): CHOL, HDL, LDLCALC, TRIG, CHOLHDL, LDLDIRECT in the last 72 hours. Thyroid Function Tests: No results for input(s): TSH, T4TOTAL, FREET4, T3FREE, THYROIDAB in the last 72 hours. Anemia Panel: No results for input(s): VITAMINB12, FOLATE, FERRITIN, TIBC, IRON, RETICCTPCT in the last 72 hours. Sepsis Labs: Recent Labs  Lab 02/25/18 1755 02/25/18 1954 02/26/18 0823  PROCALCITON  --   --  <0.10  LATICACIDVEN 1.83 1.78 0.9    Recent Results (from the past 240 hour(s))  Culture, blood (Routine x 2)     Status: None (Preliminary result)   Collection Time: 02/25/18  5:37 PM  Result Value Ref Range Status   Specimen Description BLOOD LEFT ARM  Final   Special Requests   Final    BOTTLES DRAWN AEROBIC AND ANAEROBIC Blood Culture adequate volume   Culture   Setup Time   Final    GRAM NEGATIVE RODS AEROBIC BOTTLE ONLY Organism ID to follow    Culture PENDING  Incomplete   Report Status PENDING  Incomplete  Culture, blood (Routine x 2)     Status: None (Preliminary result)   Collection Time: 02/25/18  7:43 PM  Result Value Ref Range Status   Specimen Description BLOOD RIGHT FOREARM  Final   Special Requests   Final    BOTTLES DRAWN AEROBIC AND ANAEROBIC Blood Culture adequate volume   Culture   Final    NO GROWTH < 12 HOURS Performed at [facility], [place] [address] [place], [place] [address]    Report Status PENDING  Incomplete  MRSA PCR Screening     Status: None   Collection Time: 02/26/18 12:05 AM  Result Value Ref Range Status   MRSA by PCR NEGATIVE NEGATIVE Final    Comment:        The GeneXpert MRSA Assay (FDA approved for NASAL specimens only), is one component of a comprehensive MRSA colonization surveillance program. It is not intended to diagnose MRSA infection nor to guide or monitor treatment for MRSA infections. Performed at [facility], [place] [address], [place], [place] [address]          Radiology Studies: Dg Chest 2 View  Result Date: 02/25/2018 CLINICAL DATA:  Right-sided flank pain and abdominal pain.  Fever. EXAM: CHEST - 2 VIEW COMPARISON:  10/26/2017 and 12/06/2017 FINDINGS: Lungs are adequately inflated with minimal prominence of the perihilar markings. No focal lobar consolidation or effusion. Mild stable cardiomegaly. Remainder of the exam is unchanged. IMPRESSION: Stable cardiomegaly with suggestion of minimal vascular congestion. Electronically Signed   By: Marin Olp M.D.   On: 02/25/2018 18:06   Ct Abdomen Pelvis W Contrast  Result Date: 02/25/2018 CLINICAL DATA:  84 year old female with abdominal pain. EXAM: CT ABDOMEN AND PELVIS WITH CONTRAST TECHNIQUE: Multidetector CT imaging of the abdomen and pelvis was performed using the standard protocol following bolus  administration of intravenous contrast. CONTRAST:  161mL OMNIPAQUE IOHEXOL 300 MG/ML  SOLN COMPARISON:  Abdominal CT dated 01/07/2018 FINDINGS: Lower chest: Minimal bibasilar scarring. The visualized lung bases are otherwise clear. There is mild cardiomegaly. No intra-abdominal free air or free fluid. Hepatobiliary: Cholecystectomy. No intrahepatic biliary ductal dilatation. The liver is unremarkable. Pancreas: Fatty infiltration of the pancreas. No dilatation of the main pancreatic duct. Spleen: Normal in size without focal abnormality. Adrenals/Urinary Tract: The adrenal glands are unremarkable. There is a 7 mm stone in the distal right ureter with mild right  hydronephrosis. There is mild fullness of the left ureter. No definite stone identified along the course of the left ureter. There is stranding of the right perinephric and bilateral periureteric fat. Correlation with urinalysis recommended to exclude cystitis. Right renal 5 cm upper pole cyst. Small amount of air within the urinary bladder likely related to recent instrumentation. Clinical correlation is recommended. Stomach/Bowel: There multiple duodenal diverticula measuring up to 3 cm without active inflammatory changes. There is sigmoid diverticulosis and scattered colonic diverticula without active inflammatory changes. There is no bowel obstruction or active inflammation. Appendectomy. Vascular/Lymphatic: Moderate aortoiliac atherosclerotic disease. No portal venous gas. There is no adenopathy. Reproductive: Hysterectomy.  No pelvic mass. Other: Small fat containing umbilical hernia.  No fluid collection. Musculoskeletal: Bilateral L5 pars defects. No acute osseous pathology. There are degenerative changes of the spine with disc desiccation. IMPRESSION: 1. A 7 mm distal right ureteral calculus with mild right hydronephrosis. Correlation with urinalysis recommended to exclude superimposed UTI. 2. Colonic diverticulosis as well as multiple duodenal  diverticula. No bowel obstruction or active inflammation. 3.  Aortic Atherosclerosis (ICD10-I70.0). Electronically Signed   By: Anner Crete M.D.   On: 02/25/2018 21:18   Dg C-arm 1-60 Min-no Report  Result Date: 02/26/2018 Fluoroscopy was utilized by the requesting physician.  No radiographic interpretation.        Scheduled Meds: Continuous Infusions: . cefTRIAXone (ROCEPHIN)  IV 2 g (02/26/18 0541)  . lactated ringers 125 mL/hr at 02/26/18 0044  . vancomycin Stopped (02/26/18 0034)     LOS: 1 day    Time spent: 35 minutes    Irine Seal, MD [facility] Pager [phone]  If 7PM-7AM, please contact night-coverage [email or web address] Password [email or web address] 02/26/2018, 9:41 AM

## 2018-02-26 NOTE — Transfer of Care (Signed)
Immediate Anesthesia Transfer of Care Note  Patient: [name] Case  Procedure(s) Performed: CYSTOSCOPY WITH RETROGRADE PYELOGRAM/URETERAL STENT PLACEMENT (Right Ureter)  Patient Location: PACU  Anesthesia Type:General  Level of Consciousness: awake, alert  and oriented  Airway & Oxygen Therapy: Patient Spontanous Breathing and Patient connected to face mask oxygen  Post-op Assessment: Report given to RN and Post -op Vital signs reviewed and stable  Post vital signs: Reviewed and stable  Last Vitals:  Vitals Value Taken Time  BP 113/63 02/26/2018  6:54 AM  Temp    Pulse 97 02/26/2018  6:57 AM  Resp 16 02/26/2018  6:57 AM  SpO2 100 % 02/26/2018  6:57 AM  Vitals shown include unvalidated device data.  Last Pain:  Vitals:   02/26/18 0619  TempSrc: Oral  PainSc: 0-No pain         Complications: No apparent anesthesia complications

## 2018-02-26 NOTE — Anesthesia Procedure Notes (Signed)
Procedure Name: LMA Insertion Date/Time: 02/26/2018 6:34 AM Performed by: Maxwell Caul, CRNA Pre-anesthesia Checklist: Patient identified, Emergency Drugs available, Suction available and Patient being monitored Patient Re-evaluated:Patient Re-evaluated prior to induction Oxygen Delivery Method: Circle system utilized Preoxygenation: Pre-oxygenation with 100% oxygen Induction Type: IV induction LMA: LMA inserted LMA Size: 4.0 Number of attempts: 1 Placement Confirmation: positive ETCO2 and breath sounds checked- equal and bilateral Tube secured with: Tape

## 2018-02-26 NOTE — Telephone Encounter (Signed)
Pt has been scheduled to see Roma Kayser for genetic counseling on 8/12 at 1pm. Pt is currently in the hospital. Letter mailed to the pt.

## 2018-02-27 LAB — CBC WITH DIFFERENTIAL/PLATELET
BASOS ABS: 0.1 10*3/uL (ref 0.0–0.1)
Basophils Relative: 1 %
Eosinophils Absolute: 0.2 10*3/uL (ref 0.0–0.7)
Eosinophils Relative: 2 %
HCT: 30.3 % — ABNORMAL LOW (ref 36.0–46.0)
Hemoglobin: 9.6 g/dL — ABNORMAL LOW (ref 12.0–15.0)
LYMPHS PCT: 14 %
Lymphs Abs: 1.3 10*3/uL (ref 0.7–4.0)
MCH: 31.1 pg (ref 26.0–34.0)
MCHC: 31.7 g/dL (ref 30.0–36.0)
MCV: 98.1 fL (ref 78.0–100.0)
MONO ABS: 0.8 10*3/uL (ref 0.1–1.0)
Monocytes Relative: 9 %
Neutro Abs: 7.3 10*3/uL (ref 1.7–7.7)
Neutrophils Relative %: 74 %
Platelets: 190 10*3/uL (ref 150–400)
RBC: 3.09 MIL/uL — ABNORMAL LOW (ref 3.87–5.11)
RDW: 14.6 % (ref 11.5–15.5)
WBC: 9.8 10*3/uL (ref 4.0–10.5)

## 2018-02-27 LAB — BASIC METABOLIC PANEL
ANION GAP: 5 (ref 5–15)
BUN: 12 mg/dL (ref 8–23)
CO2: 31 mmol/L (ref 22–32)
Calcium: 8.1 mg/dL — ABNORMAL LOW (ref 8.9–10.3)
Chloride: 106 mmol/L (ref 98–111)
Creatinine, Ser: 0.92 mg/dL (ref 0.44–1.00)
GFR calc Af Amer: >60 mL/min (ref >60)
GFR, EST NON AFRICAN AMERICAN: 56 mL/min — AB (ref >60)
GLUCOSE: 107 mg/dL — AB (ref 70–99)
POTASSIUM: 4.2 mmol/L (ref 3.5–5.1)
Sodium: 142 mmol/L (ref 135–145)

## 2018-02-27 LAB — GLUCOSE, CAPILLARY
GLUCOSE-CAPILLARY: 129 mg/dL — AB (ref 70–99)
GLUCOSE-CAPILLARY: 131 mg/dL — AB (ref 70–99)
Glucose-Capillary: 92 mg/dL (ref 70–99)
Glucose-Capillary: 96 mg/dL (ref 70–99)

## 2018-02-27 LAB — MAGNESIUM: Magnesium: 2.3 mg/dL (ref 1.7–2.4)

## 2018-02-27 MED ORDER — DILTIAZEM HCL ER COATED BEADS 180 MG PO CP24
180.0000 mg | ORAL_CAPSULE | Freq: Every day | ORAL | Status: DC
  Administered 2018-02-27 – 2018-03-01 (×3): 180 mg via ORAL
  Filled 2018-02-27 (×3): qty 1

## 2018-02-27 MED ORDER — ORAL CARE MOUTH RINSE
15.0000 mL | Freq: Two times a day (BID) | OROMUCOSAL | Status: DC
  Administered 2018-02-27 – 2018-02-28 (×2): 15 mL via OROMUCOSAL

## 2018-02-27 MED ORDER — TRAZODONE HCL 50 MG PO TABS
50.0000 mg | ORAL_TABLET | Freq: Once | ORAL | Status: AC
Start: 2018-02-27 — End: 2018-02-27
  Administered 2018-02-27: 50 mg via ORAL
  Filled 2018-02-27: qty 1

## 2018-02-27 NOTE — Progress Notes (Signed)
Assumed care of patient at this time. Patient is stable with no complaints at this time. Agree with previously documented assessment. Will continue to monitor patient.   

## 2018-02-27 NOTE — Evaluation (Signed)
Physical Therapy Evaluation Patient Details Name: [name] Case MRN: [id number] DOB: 27-Jan-1932 Today's Date: 02/27/2018   History of Present Illness  Pt admitted through ED with R flank pain and with dx of sepsis.  Pt with hx of COPD, CHF, A-fib, and cardiomegaly  Clinical Impression  Pt admitted as above and presenting with functional mobility limitations 2* generalized weakness, ambulatory balance deficits and obesity.  Pt should progress to dc home with assist of family.    Follow Up Recommendations Home health PT    Equipment Recommendations  None recommended by PT    Recommendations for Other Services       Precautions / Restrictions Precautions Precautions: Fall Restrictions Weight Bearing Restrictions: No      Mobility  Bed Mobility Overal bed mobility: Needs Assistance Bed Mobility: Supine to Sit;Sit to Supine     Supine to sit: Min guard Sit to supine: Min assist;Mod assist   General bed mobility comments: Pt utilizing bed rails to self assist and requiring assist to bring LEs up into bed  Transfers Overall transfer level: Needs assistance Equipment used: Rolling walker (2 wheeled) Transfers: Sit to/from Stand Sit to Stand: Min assist         General transfer comment: assist to bring weight up and forward  Ambulation/Gait Ambulation/Gait assistance: Min assist Gait Distance (Feet): 111 Feet(and 15' into bathroom) Assistive device: Rolling walker (2 wheeled) Gait Pattern/deviations: Step-through pattern;Decreased step length - right;Decreased step length - left;Shuffle;Trunk flexed Gait velocity: decr   General Gait Details: Several short standing rest breaks and min cues for posture and position from [employer]            Wheelchair Mobility    Modified Rankin (Stroke Patients Only)       Balance Overall balance assessment: Needs assistance Sitting-balance support: No upper extremity supported;Feet supported Sitting balance-Leahy  Scale: Good     Standing balance support: Bilateral upper extremity supported Standing balance-Leahy Scale: Fair                               Pertinent Vitals/Pain Pain Assessment: No/denies pain    Home Living Family/patient expects to be discharged to:: Private residence Living Arrangements: Other relatives Available Help at Discharge: Family Type of Home: House Home Access: Ramped entrance     Home Layout: One level Home Equipment: [place] - single point;Walker - 2 wheels Additional Comments: Pt lives with granddaughter who does not work    Prior Function Level of Independence: Independent with assistive device(s)         Comments: used cane to mobilize     [employer]        Extremity/Trunk Assessment   Upper Extremity Assessment Upper Extremity Assessment: Generalized weakness    Lower Extremity Assessment Lower Extremity Assessment: Generalized weakness    Cervical / Trunk Assessment Cervical / Trunk Assessment: Kyphotic  Communication   Communication: No difficulties  Cognition Arousal/Alertness: Awake/alert Behavior During Therapy: WFL for tasks assessed/performed;Flat affect Overall Cognitive Status: Within Functional Limits for tasks assessed                                        General Comments      Exercises     Assessment/Plan    PT Assessment Patient needs continued PT services  PT Problem List Decreased strength;Decreased activity  tolerance;Decreased balance;Decreased mobility;Decreased knowledge of use of DME;Obesity       PT Treatment Interventions DME instruction;Gait training;Functional mobility training;Therapeutic activities;Therapeutic exercise;Patient/family education;Balance training    PT Goals (Current goals can be found in the Care Plan section)  Acute Rehab PT Goals Patient Stated Goal: Home with granddaughter PT Goal Formulation: With patient Time For Goal Achievement:  03/13/18 Potential to Achieve Goals: Fair    Frequency Min 3X/week   Barriers to discharge        Co-evaluation               AM-PAC PT "6 Clicks" Daily Activity  Outcome Measure Difficulty turning over in bed (including adjusting bedclothes, sheets and blankets)?: A Little Difficulty moving from lying on back to sitting on the side of the bed? : A Lot Difficulty sitting down on and standing up from a chair with arms (e.g., wheelchair, bedside commode, etc,.)?: Unable Help needed moving to and from a bed to chair (including a wheelchair)?: A Little Help needed walking in hospital room?: A Little Help needed climbing 3-5 steps with a railing? : A Lot 6 Click Score: 14    End of Session   Activity Tolerance: Patient tolerated treatment well;Patient limited by fatigue Patient left: in bed;with call bell/phone within reach;with bed alarm set Nurse Communication: Mobility status PT Visit Diagnosis: Difficulty in walking, not elsewhere classified (R26.2)    Time: 0263-7858 PT Time Calculation (min) (ACUTE ONLY): 27 min   Charges:   PT Evaluation $PT Eval Low Complexity: 1 Low PT Treatments $Gait Training: 8-22 mins   PT G Codes:        Pg [phone]   Federica Allport 02/27/2018, 3:46 PM

## 2018-02-27 NOTE — Progress Notes (Signed)
PROGRESS NOTE    [name] Case  YIF:[id number] DOB: 1932-07-16 DOA: 02/25/2018 PCP: Hoyt Koch, MD    Brief Narrative:  [name] Case is a 84 y.o. 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  Has been feeling weak.  Had one episode of vomiting.  Denies any diarrhea.  Denies chest pain or shortness of breath.  ED Course: In the ER patient is found to be febrile tachycardic and was initially hypotensive.  Blood pressure improved with fluids.  UA is consistent with UTI.  CT abdomen pelvis shows right-sided hydronephrosis with distal ureteral stone.  On-call urologist Dr. Edwena Blow was consulted and patient is likely to go for stent placement and will be transferred to [facility] long hospital.     Assessment & Plan:   Principal Problem:   Sepsis ([place]) Active Problems:   Pyohydronephrosis   Acute pyelonephritis   Hypothyroidism   Morbid obesity ([place])   Essential hypertension   GERD   Atrial fibrillation ([place])   Type 2 diabetes mellitus without complication ([place])   Chronic respiratory failure with hypercapnia ([place])   Acute lower UTI   Bacteremia due to Escherichia coli   Urinary tract infection with hematuria  #1 sepsis secondary to pyelonephritis/pyohydronephrosis, UTI Patient was noted to have concerns for obstructive distal urethral stone noted on CT abdomen and pelvis that she presented with right flank pain radiating to the groin region. Status post cystoscopy with right retrograde pyelogram including interpretation, right double-J stent placement per Dr. Karsten Ro 02/26/2018.  On admission patient noted to have a fever with a temperature of 102.7, tachycardia with heart rates as high as 132, tachypnea with respiratory rates as high as 27, borderline blood pressure of 102/49, with a  leukocytosis and meeting criteria for sepsis.  Patient has been pancultured with blood cultures growing E. coli.  Urine cultures with > 100,000 gram-negative rods.  Sensitivities pending.  Per urologist note patient had urine cultures done on 02/23/2018 with cultures growing E. Coli, which was sensitive to all antibiotics except Levaquin and ciprofloxacin.  Patient with recent stent removal 02/23/2018 after she underwent bilateral ureteroscopy and resection of her bilateral renal pelvic masses with first stage on 01/29/2018 and second stage on 02/10/2018.  Preliminary blood cultures now positive for E. coli which may have likely seeded from the urine.  IV vancomycin has been discontinued.  Continue IV Rocephin at 2 g daily.  Saline lock IV fluids.  Will likely need 2 weeks of antibiotics.    2.  E. coli bacteremia Likely seeded from recent UTI 02/23/2018 with cultures positive for E. coli per urology.  IV vancomycin has been discontinued.  Continue IV Rocephin.  Will likely need 2 weeks of antibiotics.    3.  Atrial fibrillation Apixaban on hold for stent placement which was done on 02/26/2018.  Will defer resumption of anticoagulation to urology.  Metoprolol has been resumed.  Resume Cardizem today.   4.  Hypothyroidism Continue home dose Synthroid.    5.  Hypertension Blood pressure on admission was low patient noted to have borderline hypotension and as such antihypertensive medications held.  Continue to hold ARB, Lasix.  Metoprolol was resumed yesterday.  Resume Cardizem.  Follow.    6.  History of diastolic CHF Last EF was in May 2017 with EF of 50 to 55%.  Lasix on hold due to hypotension and sepsis.  Metoprolol has been resumed.  Continue to hold ARB.  Coreg resumed.  Follow.  Likely resume diuretics in the next 48 to 72 hours.    7.  History of bilateral renal pelvic transitional cell Per urology has been managed endoscopically.  Per urology.  8.  Morbid obesity  9.  Diabetes mellitus type  2 Hemoglobin A1c was 6.3 on 01/19/2018.  CBG of 92 this morning.  Continue sliding scale insulin.    10 hypokalemia/hypomagnesemia Repleted.  Keep potassium greater than 4 and magnesium greater than 2.  Magnesium level at 2.3 today.  12.  Iron deficiency anemia Hemoglobin currently at 9.6 from 11.7 on admission.  No overt bleeding.  Will likely need oral iron supplementation on discharge.  Follow H&H.  Transfusion threshold hemoglobin less than 7.    DVT prophylaxis: SCDs Code Status: Partial Family Communication: Updated patient and great granddaughter at bedside. Disposition Plan: Transfer to telemetry.    Consultants:   Urology: Dr. Karsten Ro 02/26/2018  Procedures:   CT abdomen and pelvis 02/25/2018  Chest x-ray 02/25/2018  Cystoscopy with right retrograde pyelogram including interpretation, right double-J stent placement per Dr. Karsten Ro 02/26/2018  Antimicrobials:  IV Rocephin 02/25/2018  IV vancomycin 02/25/2018>>>>> 02/26/2018   Subjective: In bed.  Denies any chest pain or shortness of breath.  States she is feeling better.  Denies any further flank pain.  Tolerating current diet.  Asking when her medications from home will be resumed.   Objective: Vitals:   02/27/18 0500 02/27/18 0600 02/27/18 0700 02/27/18 0823  BP: (!) 116/40   130/61  Pulse: (!) 102 94 (!) 110 (!) 112  Resp: 17 17 14 20   Temp:    97.6 F (36.4 C)  TempSrc:    Oral  SpO2: 97% 99% 98% 99%  Weight:      Height:        Intake/Output Summary (Last 24 hours) at 02/27/2018 1005 Last data filed at 02/27/2018 0900 Gross per 24 hour  Intake 1422.08 ml  Output 2300 ml  Net -877.92 ml   Filed Weights   02/25/18 2100  Weight: 97.5 kg (214 lb 15.2 oz)    Examination:  General exam: Appears calm and comfortable  Respiratory system: Some coarse breath sounds in the bases bilaterally.  No wheezing, no rhonchi.  Normal respiratory effort.  Cardiovascular system: Irregularly irregular.  No JVD.  No  murmurs.  No rubs.  No gallops.  No lower extremity edema.  Gastrointestinal system: Abdomen is soft, nontender, nondistended, positive bowel sounds.  No rebound.  No guarding.  No CVA tenderness. Central nervous system: Alert and oriented. No focal neurological deficits. Extremities: Symmetric 5 x 5 power. Skin: No rashes, lesions or ulcers Psychiatry: Judgement and insight appear normal. Mood & affect appropriate.     Data Reviewed: I have personally reviewed following labs and imaging studies  CBC: Recent Labs  Lab 02/25/18 1737 02/26/18 0823 02/27/18 0311  WBC 11.5* 11.7* 9.8  NEUTROABS 9.2* 9.2* 7.3  HGB 11.7* 10.5* 9.6*  HCT 37.9 32.9* 30.3*  MCV 97.9 97.3 98.1  PLT 240 199 948   Basic Metabolic Panel: Recent Labs  Lab 02/25/18 1737 02/26/18 0823 02/27/18 0311  NA 138 140 142  K 3.3* 3.1* 4.2  CL 100 101 106  CO2 27 32 31  GLUCOSE 129* 121* 107*  BUN 10 13 12   CREATININE 0.92 0.89 0.92  CALCIUM 8.7* 8.1* 8.1*  MG  --  1.2* 2.3   GFR: Estimated Creatinine Clearance: 48.6 mL/min (by C-G formula based on SCr of 0.92 mg/dL). Liver Function Tests: Recent Labs  Lab 02/25/18 1737 02/26/18 0823  AST 21 14*  ALT 19 16  ALKPHOS 74 61  BILITOT 0.8 0.7  PROT 6.2* 5.6*  ALBUMIN 3.3* 2.6*   No results for input(s): LIPASE, AMYLASE in the last 168 hours. No results for input(s): AMMONIA in the last 168 hours. Coagulation Profile: Recent Labs  Lab 02/25/18 1737 02/26/18 0823  INR 1.45 1.42   Cardiac Enzymes: No results for input(s): CKTOTAL, CKMB, CKMBINDEX, TROPONINI in the last 168 hours. BNP (last 3 results) No results for input(s): PROBNP in the last 8760 hours. HbA1C: No results for input(s): HGBA1C in the last 72 hours. CBG: Recent Labs  Lab 02/26/18 0700 02/26/18 1130 02/26/18 1703 02/26/18 2216 02/27/18 0739  GLUCAP 107* 157* 133* 106* 92   Lipid Profile: No results for input(s): CHOL, HDL, LDLCALC, TRIG, CHOLHDL, LDLDIRECT in the last 72  hours. Thyroid Function Tests: No results for input(s): TSH, T4TOTAL, FREET4, T3FREE, THYROIDAB in the last 72 hours. Anemia Panel: Recent Labs    02/26/18 1016  VITAMINB12 228  FOLATE 9.4  FERRITIN 99  TIBC 244*  IRON 9*   Sepsis Labs: Recent Labs  Lab 02/25/18 1755 02/25/18 1954 02/26/18 0823  PROCALCITON  --   --  <0.10  LATICACIDVEN 1.83 1.78 0.9    Recent Results (from the past 240 hour(s))  Culture, blood (Routine x 2)     Status: Abnormal (Preliminary result)   Collection Time: 02/25/18  5:37 PM  Result Value Ref Range Status   Specimen Description BLOOD LEFT ARM  Final   Special Requests   Final    BOTTLES DRAWN AEROBIC AND ANAEROBIC Blood Culture adequate volume   Culture  Setup Time   Final    GRAM NEGATIVE RODS AEROBIC BOTTLE ONLY CRITICAL RESULT CALLED TO, READ BACK BY AND VERIFIED WITH: Guadlupe Spanish PharmD 10:10 02/26/18 ([email or web address])    Culture (A)  Final    ESCHERICHIA COLI SUSCEPTIBILITIES TO FOLLOW Performed at [facility], [place] [address], [place], [place] [address]    Report Status PENDING  Incomplete  Blood Culture ID Panel (Reflexed)     Status: Abnormal   Collection Time: 02/25/18  5:37 PM  Result Value Ref Range Status   Enterococcus species NOT DETECTED NOT DETECTED Final   Listeria monocytogenes NOT DETECTED NOT DETECTED Final   Staphylococcus species NOT DETECTED NOT DETECTED Final   Staphylococcus aureus NOT DETECTED NOT DETECTED Final   Streptococcus species NOT DETECTED NOT DETECTED Final   Streptococcus agalactiae NOT DETECTED NOT DETECTED Final   Streptococcus pneumoniae NOT DETECTED NOT DETECTED Final   Streptococcus pyogenes NOT DETECTED NOT DETECTED Final   Acinetobacter baumannii NOT DETECTED NOT DETECTED Final   Enterobacteriaceae species DETECTED (A) NOT DETECTED Final    Comment: Enterobacteriaceae represent a large family of gram-negative bacteria, not a single organism. CRITICAL RESULT CALLED TO, READ BACK BY AND VERIFIED  WITH: Guadlupe Spanish PharmD 10:10 02/26/18 ([email or web address])    Enterobacter cloacae complex NOT DETECTED NOT DETECTED Final   Escherichia coli DETECTED (A) NOT DETECTED Final    Comment: CRITICAL RESULT CALLED TO, READ BACK BY AND VERIFIED WITH: Guadlupe Spanish PharmD 10:10 02/26/18 ([email or web address])    Klebsiella oxytoca NOT DETECTED NOT DETECTED Final   Klebsiella pneumoniae NOT DETECTED NOT DETECTED Final   Proteus species NOT DETECTED NOT DETECTED Final   Serratia  marcescens NOT DETECTED NOT DETECTED Final   Carbapenem resistance NOT DETECTED NOT DETECTED Final   Haemophilus influenzae NOT DETECTED NOT DETECTED Final   Neisseria meningitidis NOT DETECTED NOT DETECTED Final   Pseudomonas aeruginosa NOT DETECTED NOT DETECTED Final   Candida albicans NOT DETECTED NOT DETECTED Final   Candida glabrata NOT DETECTED NOT DETECTED Final   Candida krusei NOT DETECTED NOT DETECTED Final   Candida parapsilosis NOT DETECTED NOT DETECTED Final   Candida tropicalis NOT DETECTED NOT DETECTED Final  Urine culture     Status: Abnormal (Preliminary result)   Collection Time: 02/25/18  7:35 PM  Result Value Ref Range Status   Specimen Description URINE, CLEAN CATCH  Final   Special Requests   Final    NONE Performed at [facility], [place] [address] [place], [place] [address]    Culture >=100,000 COLONIES/mL GRAM NEGATIVE RODS (A)  Final   Report Status PENDING  Incomplete  Culture, blood (Routine x 2)     Status: None (Preliminary result)   Collection Time: 02/25/18  7:43 PM  Result Value Ref Range Status   Specimen Description BLOOD RIGHT FOREARM  Final   Special Requests   Final    BOTTLES DRAWN AEROBIC AND ANAEROBIC Blood Culture adequate volume   Culture   Final    NO GROWTH 2 DAYS Performed at [facility], [place] [address], [place], [place] [address]    Report Status PENDING  Incomplete  MRSA PCR Screening     Status: None   Collection Time: 02/26/18 12:05 AM  Result Value Ref Range Status    MRSA by PCR NEGATIVE NEGATIVE Final    Comment:        The GeneXpert MRSA Assay (FDA approved for NASAL specimens only), is one component of a comprehensive MRSA colonization surveillance program. It is not intended to diagnose MRSA infection nor to guide or monitor treatment for MRSA infections. Performed at [facility], [place] [address] [place], [place] [address]          Radiology Studies: Dg Chest 2 View  Result Date: 02/25/2018 CLINICAL DATA:  Right-sided flank pain and abdominal pain.  Fever. EXAM: CHEST - 2 VIEW COMPARISON:  10/26/2017 and 12/06/2017 FINDINGS: Lungs are adequately inflated with minimal prominence of the perihilar markings. No focal lobar consolidation or effusion. Mild stable cardiomegaly. Remainder of the exam is unchanged. IMPRESSION: Stable cardiomegaly with suggestion of minimal vascular congestion. Electronically Signed   By: Marin Olp M.D.   On: 02/25/2018 18:06   Ct Abdomen Pelvis W Contrast  Result Date: 02/25/2018 CLINICAL DATA:  84 year old female with abdominal pain. EXAM: CT ABDOMEN AND PELVIS WITH CONTRAST TECHNIQUE: Multidetector CT imaging of the abdomen and pelvis was performed using the standard protocol following bolus administration of intravenous contrast. CONTRAST:  174mL OMNIPAQUE IOHEXOL 300 MG/ML  SOLN COMPARISON:  Abdominal CT dated 01/07/2018 FINDINGS: Lower chest: Minimal bibasilar scarring. The visualized lung bases are otherwise clear. There is mild cardiomegaly. No intra-abdominal free air or free fluid. Hepatobiliary: Cholecystectomy. No intrahepatic biliary ductal dilatation. The liver is unremarkable. Pancreas: Fatty infiltration of the pancreas. No dilatation of the main pancreatic duct. Spleen: Normal in size without focal abnormality. Adrenals/Urinary Tract: The adrenal glands are unremarkable. There is a 7 mm stone in the distal right ureter with mild right hydronephrosis. There is mild fullness of the left  ureter. No definite stone identified along the course of the left ureter. There is stranding of  the right perinephric and bilateral periureteric fat. Correlation with urinalysis recommended to exclude cystitis. Right renal 5 cm upper pole cyst. Small amount of air within the urinary bladder likely related to recent instrumentation. Clinical correlation is recommended. Stomach/Bowel: There multiple duodenal diverticula measuring up to 3 cm without active inflammatory changes. There is sigmoid diverticulosis and scattered colonic diverticula without active inflammatory changes. There is no bowel obstruction or active inflammation. Appendectomy. Vascular/Lymphatic: Moderate aortoiliac atherosclerotic disease. No portal venous gas. There is no adenopathy. Reproductive: Hysterectomy.  No pelvic mass. Other: Small fat containing umbilical hernia.  No fluid collection. Musculoskeletal: Bilateral L5 pars defects. No acute osseous pathology. There are degenerative changes of the spine with disc desiccation. IMPRESSION: 1. A 7 mm distal right ureteral calculus with mild right hydronephrosis. Correlation with urinalysis recommended to exclude superimposed UTI. 2. Colonic diverticulosis as well as multiple duodenal diverticula. No bowel obstruction or active inflammation. 3.  Aortic Atherosclerosis (ICD10-I70.0). Electronically Signed   By: Anner Crete M.D.   On: 02/25/2018 21:18   Dg C-arm 1-60 Min-no Report  Result Date: 02/26/2018 Fluoroscopy was utilized by the requesting physician.  No radiographic interpretation.        Scheduled Meds: . atorvastatin  20 mg Oral q1800  . diltiazem  180 mg Oral Daily  . famotidine  20 mg Oral Daily  . insulin aspart  0-15 Units Subcutaneous TID WC  . levothyroxine  100 mcg Oral QAC breakfast  . mouth rinse  15 mL Mouth Rinse BID  . metoprolol succinate  50 mg Oral Daily   Continuous Infusions: . cefTRIAXone (ROCEPHIN)  IV Stopped (02/27/18 0849)     LOS: 2 days     Time spent: 35 minutes    Irine Seal, MD [facility] Pager [phone]  If 7PM-7AM, please contact night-coverage [email or web address] Password [email or web address] 02/27/2018, 10:05 AM

## 2018-02-27 NOTE — Progress Notes (Signed)
Called report to RN on [place] transferred in wheelchair on room air and telemetry.

## 2018-02-28 LAB — URINE CULTURE: Culture: >=100000 — AB

## 2018-02-28 LAB — CBC WITH DIFFERENTIAL/PLATELET
Basophils Absolute: 0 10*3/uL (ref 0.0–0.1)
Basophils Relative: 1 %
EOS ABS: 0.3 10*3/uL (ref 0.0–0.7)
Eosinophils Relative: 4 %
HEMATOCRIT: 31 % — AB (ref 36.0–46.0)
HEMOGLOBIN: 9.7 g/dL — AB (ref 12.0–15.0)
LYMPHS ABS: 1.2 10*3/uL (ref 0.7–4.0)
Lymphocytes Relative: 14 %
MCH: 30.7 pg (ref 26.0–34.0)
MCHC: 31.3 g/dL (ref 30.0–36.0)
MCV: 98.1 fL (ref 78.0–100.0)
MONOS PCT: 9 %
Monocytes Absolute: 0.7 10*3/uL (ref 0.1–1.0)
Neutro Abs: 5.8 10*3/uL (ref 1.7–7.7)
Neutrophils Relative %: 72 %
Platelets: 208 10*3/uL (ref 150–400)
RBC: 3.16 MIL/uL — AB (ref 3.87–5.11)
RDW: 14.4 % (ref 11.5–15.5)
WBC: 8 10*3/uL (ref 4.0–10.5)

## 2018-02-28 LAB — BASIC METABOLIC PANEL
Anion gap: 5 (ref 5–15)
BUN: 11 mg/dL (ref 8–23)
CHLORIDE: 105 mmol/L (ref 98–111)
CO2: 31 mmol/L (ref 22–32)
Calcium: 8.3 mg/dL — ABNORMAL LOW (ref 8.9–10.3)
Creatinine, Ser: 0.84 mg/dL (ref 0.44–1.00)
GFR calc Af Amer: >60 mL/min (ref >60)
GFR calc non Af Amer: >60 mL/min (ref >60)
Glucose, Bld: 107 mg/dL — ABNORMAL HIGH (ref 70–99)
POTASSIUM: 3.7 mmol/L (ref 3.5–5.1)
SODIUM: 141 mmol/L (ref 135–145)

## 2018-02-28 LAB — CULTURE, BLOOD (ROUTINE X 2): Special Requests: ADEQUATE

## 2018-02-28 LAB — GLUCOSE, CAPILLARY
Glucose-Capillary: 110 mg/dL — ABNORMAL HIGH (ref 70–99)
Glucose-Capillary: 157 mg/dL — ABNORMAL HIGH (ref 70–99)
Glucose-Capillary: 80 mg/dL (ref 70–99)
Glucose-Capillary: 131 mg/dL — ABNORMAL HIGH (ref 70–99)

## 2018-02-28 LAB — MAGNESIUM: MAGNESIUM: 2.1 mg/dL (ref 1.7–2.4)

## 2018-02-28 MED ORDER — POLYSACCHARIDE IRON COMPLEX 150 MG PO CAPS
150.0000 mg | ORAL_CAPSULE | Freq: Every day | ORAL | Status: DC
  Administered 2018-03-01: 150 mg via ORAL
  Filled 2018-02-28: qty 1

## 2018-02-28 MED ORDER — APIXABAN 5 MG PO TABS
5.0000 mg | ORAL_TABLET | Freq: Two times a day (BID) | ORAL | Status: DC
  Administered 2018-02-28 – 2018-03-01 (×2): 5 mg via ORAL
  Filled 2018-02-28 (×2): qty 1

## 2018-02-28 MED ORDER — POTASSIUM CHLORIDE CRYS ER 20 MEQ PO TBCR
40.0000 meq | EXTENDED_RELEASE_TABLET | Freq: Once | ORAL | Status: AC
  Administered 2018-02-28: 40 meq via ORAL
  Filled 2018-02-28: qty 2

## 2018-02-28 MED ORDER — AMOXICILLIN 250 MG PO CAPS
500.0000 mg | ORAL_CAPSULE | Freq: Three times a day (TID) | ORAL | Status: DC
  Administered 2018-02-28 – 2018-03-01 (×4): 500 mg via ORAL
  Filled 2018-02-28 (×4): qty 2

## 2018-02-28 MED ORDER — FUROSEMIDE 10 MG/ML IJ SOLN
20.0000 mg | Freq: Once | INTRAMUSCULAR | Status: AC
  Administered 2018-02-28: 20 mg via INTRAVENOUS
  Filled 2018-02-28: qty 2

## 2018-02-28 MED ORDER — SODIUM CHLORIDE 0.9 % IV SOLN
510.0000 mg | Freq: Once | INTRAVENOUS | Status: AC
  Administered 2018-02-28: 510 mg via INTRAVENOUS
  Filled 2018-02-28: qty 17

## 2018-02-28 MED ORDER — FUROSEMIDE 40 MG PO TABS
40.0000 mg | ORAL_TABLET | Freq: Every day | ORAL | Status: DC
  Administered 2018-03-01: 40 mg via ORAL
  Filled 2018-02-28: qty 1

## 2018-02-28 NOTE — Progress Notes (Signed)
2 Days Post-Op Subjective: Patient reports no pain. She does feel weak. Has not gotten OOB.  Objective: Vital signs in last 24 hours: Temp:  [97.5 F (36.4 C)-99.3 F (37.4 C)] 99.3 F (37.4 C) (06/30 0557) Pulse Rate:  [43-134] 85 (06/30 0557) Resp:  [16-23] 18 (06/30 0557) BP: (113-129)/(47-64) 129/60 (06/30 0557) SpO2:  [94 %-100 %] 96 % (06/30 0557) Weight:  [99.7 kg (219 lb 12.8 oz)-100 kg (220 lb 7.4 oz)] 99.7 kg (219 lb 12.8 oz) (06/30 0500)  Intake/Output from previous day: 06/29 0701 - 06/30 0700 In: 270 [P.O.:270] Out: 700 [Urine:700] Intake/Output this shift: No intake/output data recorded.  Physical Exam:  Constitutional: Vital signs reviewed. WD WN in NAD   Eyes: PERRL, No scleral icterus.   Cardiovascular: RRR Pulmonary/Chest: Normal effort    Lab Results: Recent Labs    02/26/18 0823 02/27/18 0311 02/28/18 0423  HGB 10.5* 9.6* 9.7*  HCT 32.9* 30.3* 31.0*   BMET Recent Labs    02/27/18 0311 02/28/18 0423  NA 142 141  K 4.2 3.7  CL 106 105  CO2 31 31  GLUCOSE 107* 107*  BUN 12 11  CREATININE 0.92 0.84  CALCIUM 8.1* 8.3*   Recent Labs    02/25/18 1737 02/26/18 0823  INR 1.45 1.42   No results for input(s): LABURIN in the last 72 hours. Results for orders placed or performed during the hospital encounter of 02/25/18  Culture, blood (Routine x 2)     Status: Abnormal (Preliminary result)   Collection Time: 02/25/18  5:37 PM  Result Value Ref Range Status   Specimen Description BLOOD LEFT ARM  Final   Special Requests   Final    BOTTLES DRAWN AEROBIC AND ANAEROBIC Blood Culture adequate volume   Culture  Setup Time   Final    GRAM NEGATIVE RODS AEROBIC BOTTLE ONLY CRITICAL RESULT CALLED TO, READ BACK BY AND VERIFIED WITH: Guadlupe Spanish PharmD 10:10 02/26/18 ([email or web address])    Culture (A)  Final    ESCHERICHIA COLI SUSCEPTIBILITIES TO FOLLOW Performed at [facility], [address], [place], [place] [address]    Report Status PENDING   Incomplete  Blood Culture ID Panel (Reflexed)     Status: Abnormal   Collection Time: 02/25/18  5:37 PM  Result Value Ref Range Status   Enterococcus species NOT DETECTED NOT DETECTED Final   Listeria monocytogenes NOT DETECTED NOT DETECTED Final   Staphylococcus species NOT DETECTED NOT DETECTED Final   Staphylococcus aureus NOT DETECTED NOT DETECTED Final   Streptococcus species NOT DETECTED NOT DETECTED Final   Streptococcus agalactiae NOT DETECTED NOT DETECTED Final   Streptococcus pneumoniae NOT DETECTED NOT DETECTED Final   Streptococcus pyogenes NOT DETECTED NOT DETECTED Final   Acinetobacter baumannii NOT DETECTED NOT DETECTED Final   Enterobacteriaceae species DETECTED (A) NOT DETECTED Final    Comment: Enterobacteriaceae represent a large family of gram-negative bacteria, not a single organism. CRITICAL RESULT CALLED TO, READ BACK BY AND VERIFIED WITH: Guadlupe Spanish PharmD 10:10 02/26/18 ([email or web address])    Enterobacter cloacae complex NOT DETECTED NOT DETECTED Final   Escherichia coli DETECTED (A) NOT DETECTED Final    Comment: CRITICAL RESULT CALLED TO, READ BACK BY AND VERIFIED WITH: Guadlupe Spanish PharmD 10:10 02/26/18 ([email or web address])    Klebsiella oxytoca NOT DETECTED NOT DETECTED Final   Klebsiella pneumoniae NOT DETECTED NOT DETECTED Final   Proteus species NOT DETECTED NOT DETECTED Final   Serratia marcescens NOT DETECTED NOT DETECTED Final   Carbapenem  resistance NOT DETECTED NOT DETECTED Final   Haemophilus influenzae NOT DETECTED NOT DETECTED Final   Neisseria meningitidis NOT DETECTED NOT DETECTED Final   Pseudomonas aeruginosa NOT DETECTED NOT DETECTED Final   Candida albicans NOT DETECTED NOT DETECTED Final   Candida glabrata NOT DETECTED NOT DETECTED Final   Candida krusei NOT DETECTED NOT DETECTED Final   Candida parapsilosis NOT DETECTED NOT DETECTED Final   Candida tropicalis NOT DETECTED NOT DETECTED Final  Urine culture     Status: Abnormal   Collection Time: 02/25/18   7:35 PM  Result Value Ref Range Status   Specimen Description URINE, CLEAN CATCH  Final   Special Requests   Final    NONE Performed at [facility], [place] [address] [place], [place] [address]    Culture >=100,000 COLONIES/mL ESCHERICHIA COLI (A)  Final   Report Status 02/28/2018 FINAL  Final   Organism ID, Bacteria ESCHERICHIA COLI (A)  Final      Susceptibility   Escherichia coli - MIC*    CEFAZOLIN <=4 SENSITIVE Sensitive     CEFTRIAXONE <=1 SENSITIVE Sensitive     CIPROFLOXACIN >=4 RESISTANT Resistant     GENTAMICIN <=1 SENSITIVE Sensitive     IMIPENEM <=0.25 SENSITIVE Sensitive     NITROFURANTOIN <=16 SENSITIVE Sensitive     TRIMETH/SULFA <=20 SENSITIVE Sensitive     AMPICILLIN/SULBACTAM 4 SENSITIVE Sensitive     PIP/TAZO <=4 SENSITIVE Sensitive     Extended ESBL NEGATIVE Sensitive     * >=100,000 COLONIES/mL ESCHERICHIA COLI  Culture, blood (Routine x 2)     Status: None (Preliminary result)   Collection Time: 02/25/18  7:43 PM  Result Value Ref Range Status   Specimen Description BLOOD RIGHT FOREARM  Final   Special Requests   Final    BOTTLES DRAWN AEROBIC AND ANAEROBIC Blood Culture adequate volume   Culture   Final    NO GROWTH 2 DAYS Performed at [facility], [address], [place], [place] [address]    Report Status PENDING  Incomplete  MRSA PCR Screening     Status: None   Collection Time: 02/26/18 12:05 AM  Result Value Ref Range Status   MRSA by PCR NEGATIVE NEGATIVE Final    Comment:        The GeneXpert MRSA Assay (FDA approved for NASAL specimens only), is one component of a comprehensive MRSA colonization surveillance program. It is not intended to diagnose MRSA infection nor to guide or monitor treatment for MRSA infections. Performed at [facility], [place] [address] [place],  [address]     Studies/Results: No results found.  Assessment/Plan:   POD 2 stenting for UTI/hydro. Cultures + for sens E.  Coli. She is improving  Cont abx per protocol.   We'll follow in office   LOS: 3 days   Jorja Loa 02/28/2018, 8:37 AM

## 2018-02-28 NOTE — Discharge Instructions (Signed)

## 2018-02-28 NOTE — Progress Notes (Signed)
PROGRESS NOTE    [name] Case  DDU:[id number] DOB: 08-12-1932 DOA: 02/25/2018 PCP: Hoyt Koch, MD    Brief Narrative:  [name] Case is a 84 y.o. 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  Has been feeling weak.  Had one episode of vomiting.  Denies any diarrhea.  Denies chest pain or shortness of breath.  ED Course: In the ER patient is found to be febrile tachycardic and was initially hypotensive.  Blood pressure improved with fluids.  UA is consistent with UTI.  CT abdomen pelvis shows right-sided hydronephrosis with distal ureteral stone.  On-call urologist Dr. Edwena Blow was consulted and patient is likely to go for stent placement and will be transferred to [facility] long hospital.     Assessment & Plan:   Principal Problem:   Sepsis ([place]) Active Problems:   Pyohydronephrosis   Acute pyelonephritis   Hypothyroidism   Morbid obesity ([place])   Essential hypertension   GERD   Atrial fibrillation ([place])   Type 2 diabetes mellitus without complication ([place])   Chronic respiratory failure with hypercapnia ([place])   Acute lower UTI   Bacteremia due to Escherichia coli   Urinary tract infection with hematuria  #1 sepsis secondary to pyelonephritis/pyohydronephrosis, UTI Patient was noted to have concerns for obstructive distal urethral stone noted on CT abdomen and pelvis that she presented with right flank pain radiating to the groin region. Status post cystoscopy with right retrograde pyelogram including interpretation, right double-J stent placement per Dr. Karsten Ro 02/26/2018.  On admission patient noted to have a fever with a temperature of 102.7, tachycardia with heart rates as high as 132, tachypnea with respiratory rates as high as 27, borderline blood pressure of 102/49, with a  leukocytosis and meeting criteria for sepsis.  Patient has been pancultured with blood cultures growing E. coli.  Urine cultures with > 100,000 gram-negative rods.  Sensitivities pending.  Per urologist note patient had urine cultures done on 02/23/2018 with cultures growing E. Coli, which was sensitive to all antibiotics except Levaquin and ciprofloxacin.  Patient with recent stent removal 02/23/2018 after she underwent bilateral ureteroscopy and resection of her bilateral renal pelvic masses with first stage on 01/29/2018 and second stage on 02/10/2018.  Positive for E. coli which likely seeded from the urine.   IV vancomycin has been discontinued.  Transition from IV Rocephin to oral amoxicillin and treat for total of 2 weeks from stent placement.    2.  E. coli bacteremia/complicated UTI Likely seeded from recent UTI 02/23/2018 with cultures positive for E. coli per urology.  IV vancomycin has been discontinued.  Transition from IV Rocephin to oral amoxicillin and treat for total of 2 weeks from day of stent placement, as patient with recent instrumentation.    3.  Atrial fibrillation Apixaban on hold for stent placement which was done on 02/26/2018.  Will defer resumption of anticoagulation to urology.  Metoprolol and Cardizem has been resumed.  Resume apixaban.   4.  Hypothyroidism Continue home dose Synthroid.    5.  Hypertension Blood pressure on admission was low patient noted to have borderline hypotension and as such antihypertensive medications held.  Continue to hold ARB.  Metoprolol and Cardizem resumed.Follow.    6.  History of diastolic CHF Last EF was in May 2017 with EF of 50 to  55%.  Lasix on hold due to hypotension and sepsis.  Metoprolol has been resumed.  Continue to hold ARB.  Give a dose of Lasix 20 mg IV x1.  Resume home dose diuretics tomorrow morning.  Coreg resumed.  Follow.      7.  History of bilateral renal pelvic transitional cell Per urology has been managed  endoscopically.  Per urology.  8.  Morbid obesity  9.  Diabetes mellitus type 2 Hemoglobin A1c was 6.3 on 01/19/2018.  CBG of 110 this morning.  Continue sliding scale insulin.    10 hypokalemia/hypomagnesemia Repleted.  Keep potassium greater than 4 and magnesium greater than 2.  Magnesium level at 2.1 today.  12.  Iron deficiency anemia Hemoglobin currently at 9.7 from 11.7 on admission.  No overt bleeding.  We will give IV Feraheme x1.  Will start Nu iron 150 mg daily tomorrow.   Follow H&H.  Transfusion threshold hemoglobin less than 7.    DVT prophylaxis: SCDs Code Status: Partial Family Communication: Updated patient and great granddaughter at bedside. Disposition Plan: Likely home in the next 24 to 48 hours.   Consultants:   Urology: Dr. Karsten Ro 02/26/2018  Procedures:   CT abdomen and pelvis 02/25/2018  Chest x-ray 02/25/2018  Cystoscopy with right retrograde pyelogram including interpretation, right double-J stent placement per Dr. Karsten Ro 02/26/2018  Antimicrobials:  IV Rocephin 02/25/2018>>>>> 02/28/2018  IV vancomycin 02/25/2018>>>>> 02/26/2018  Amoxicillin 02/28/2018   Subjective: Sleeping.  Easily arousable.  Denies any chest pain or shortness of breath.  No flank pain.   Objective: Vitals:   02/28/18 0500 02/28/18 0557 02/28/18 1017 02/28/18 1144  BP:  129/60 122/72 123/77  Pulse:  85 (!) 107 100  Resp:  18 20 20   Temp:  99.3 F (37.4 C) 99 F (37.2 C) 98 F (36.7 C)  TempSrc:  Oral Oral Oral  SpO2:  96% 94% 95%  Weight: 99.7 kg (219 lb 12.8 oz)     Height:        Intake/Output Summary (Last 24 hours) at 02/28/2018 1154 Last data filed at 02/27/2018 1709 Gross per 24 hour  Intake 150 ml  Output -  Net 150 ml   Filed Weights   02/25/18 2100 02/27/18 1243 02/28/18 0500  Weight: 97.5 kg (214 lb 15.2 oz) 100 kg (220 lb 7.4 oz) 99.7 kg (219 lb 12.8 oz)    Examination:  General exam: Sleeping.  Easily arousable. Respiratory system: Less coarse  breath sounds in the bases.  No rhonchi.  No wheezing.  Normal respiratory effort.   Cardiovascular system: Irregularly irregular.  No JVD.  No murmurs.  No rubs.  No gallops.  No lower extremity edema.  Gastrointestinal system: Abdomen is nontender, nondistended, soft, positive bowel sounds.  No rebound.  No guarding.  No CVA tenderness.   Central nervous system: Alert and oriented. No focal neurological deficits. Extremities: Symmetric 5 x 5 power. Skin: No rashes, lesions or ulcers Psychiatry: Judgement and insight appear normal. Mood & affect appropriate.     Data Reviewed: I have personally reviewed following labs and imaging studies  CBC: Recent Labs  Lab 02/25/18 1737 02/26/18 0823 02/27/18 0311 02/28/18 0423  WBC 11.5* 11.7* 9.8 8.0  NEUTROABS 9.2* 9.2* 7.3 5.8  HGB 11.7* 10.5* 9.6* 9.7*  HCT 37.9 32.9* 30.3* 31.0*  MCV 97.9 97.3 98.1 98.1  PLT 240 199 190 852   Basic Metabolic Panel: Recent Labs  Lab 02/25/18 1737 02/26/18 0823 02/27/18 0311 02/28/18 0423  NA 138  140 142 141  K 3.3* 3.1* 4.2 3.7  CL 100 101 106 105  CO2 27 32 31 31  GLUCOSE 129* 121* 107* 107*  BUN 10 13 12 11   CREATININE 0.92 0.89 0.92 0.84  CALCIUM 8.7* 8.1* 8.1* 8.3*  MG  --  1.2* 2.3 2.1   GFR: Estimated Creatinine Clearance: 54 mL/min (by C-G formula based on SCr of 0.84 mg/dL). Liver Function Tests: Recent Labs  Lab 02/25/18 1737 02/26/18 0823  AST 21 14*  ALT 19 16  ALKPHOS 74 61  BILITOT 0.8 0.7  PROT 6.2* 5.6*  ALBUMIN 3.3* 2.6*   No results for input(s): LIPASE, AMYLASE in the last 168 hours. No results for input(s): AMMONIA in the last 168 hours. Coagulation Profile: Recent Labs  Lab 02/25/18 1737 02/26/18 0823  INR 1.45 1.42   Cardiac Enzymes: No results for input(s): CKTOTAL, CKMB, CKMBINDEX, TROPONINI in the last 168 hours. BNP (last 3 results) No results for input(s): PROBNP in the last 8760 hours. HbA1C: No results for input(s): HGBA1C in the last 72  hours. CBG: Recent Labs  Lab 02/27/18 1144 02/27/18 1703 02/27/18 2155 02/28/18 0726 02/28/18 1142  GLUCAP 129* 96 131* 110* 157*   Lipid Profile: No results for input(s): CHOL, HDL, LDLCALC, TRIG, CHOLHDL, LDLDIRECT in the last 72 hours. Thyroid Function Tests: No results for input(s): TSH, T4TOTAL, FREET4, T3FREE, THYROIDAB in the last 72 hours. Anemia Panel: Recent Labs    02/26/18 1016  VITAMINB12 228  FOLATE 9.4  FERRITIN 99  TIBC 244*  IRON 9*   Sepsis Labs: Recent Labs  Lab 02/25/18 1755 02/25/18 1954 02/26/18 0823  PROCALCITON  --   --  <0.10  LATICACIDVEN 1.83 1.78 0.9    Recent Results (from the past 240 hour(s))  Culture, blood (Routine x 2)     Status: Abnormal   Collection Time: 02/25/18  5:37 PM  Result Value Ref Range Status   Specimen Description BLOOD LEFT ARM  Final   Special Requests   Final    BOTTLES DRAWN AEROBIC AND ANAEROBIC Blood Culture adequate volume   Culture  Setup Time   Final    GRAM NEGATIVE RODS AEROBIC BOTTLE ONLY CRITICAL RESULT CALLED TO, READ BACK BY AND VERIFIED WITH: Guadlupe Spanish PharmD 10:10 02/26/18 ([email or web address]) Performed at [facility], [place] [address] [place], [place] [address]    Culture ESCHERICHIA COLI (A)  Final   Report Status 02/28/2018 FINAL  Final   Organism ID, Bacteria ESCHERICHIA COLI  Final      Susceptibility   Escherichia coli - MIC*    AMPICILLIN 8 SENSITIVE Sensitive     CEFAZOLIN <=4 SENSITIVE Sensitive     CEFEPIME <=1 SENSITIVE Sensitive     CEFTAZIDIME <=1 SENSITIVE Sensitive     CEFTRIAXONE <=1 SENSITIVE Sensitive     CIPROFLOXACIN >=4 RESISTANT Resistant     GENTAMICIN <=1 SENSITIVE Sensitive     IMIPENEM <=0.25 SENSITIVE Sensitive     TRIMETH/SULFA <=20 SENSITIVE Sensitive     AMPICILLIN/SULBACTAM 4 SENSITIVE Sensitive     PIP/TAZO <=4 SENSITIVE Sensitive     Extended ESBL NEGATIVE Sensitive     * ESCHERICHIA COLI  Blood Culture ID Panel (Reflexed)     Status: Abnormal   Collection  Time: 02/25/18  5:37 PM  Result Value Ref Range Status   Enterococcus species NOT DETECTED NOT DETECTED Final   Listeria monocytogenes NOT DETECTED NOT DETECTED Final   Staphylococcus species NOT DETECTED NOT DETECTED Final  Staphylococcus aureus NOT DETECTED NOT DETECTED Final   Streptococcus species NOT DETECTED NOT DETECTED Final   Streptococcus agalactiae NOT DETECTED NOT DETECTED Final   Streptococcus pneumoniae NOT DETECTED NOT DETECTED Final   Streptococcus pyogenes NOT DETECTED NOT DETECTED Final   Acinetobacter baumannii NOT DETECTED NOT DETECTED Final   Enterobacteriaceae species DETECTED (A) NOT DETECTED Final    Comment: Enterobacteriaceae represent a large family of gram-negative bacteria, not a single organism. CRITICAL RESULT CALLED TO, READ BACK BY AND VERIFIED WITH: Guadlupe Spanish PharmD 10:10 02/26/18 ([email or web address])    Enterobacter cloacae complex NOT DETECTED NOT DETECTED Final   Escherichia coli DETECTED (A) NOT DETECTED Final    Comment: CRITICAL RESULT CALLED TO, READ BACK BY AND VERIFIED WITH: Guadlupe Spanish PharmD 10:10 02/26/18 ([email or web address])    Klebsiella oxytoca NOT DETECTED NOT DETECTED Final   Klebsiella pneumoniae NOT DETECTED NOT DETECTED Final   Proteus species NOT DETECTED NOT DETECTED Final   Serratia marcescens NOT DETECTED NOT DETECTED Final   Carbapenem resistance NOT DETECTED NOT DETECTED Final   Haemophilus influenzae NOT DETECTED NOT DETECTED Final   Neisseria meningitidis NOT DETECTED NOT DETECTED Final   Pseudomonas aeruginosa NOT DETECTED NOT DETECTED Final   Candida albicans NOT DETECTED NOT DETECTED Final   Candida glabrata NOT DETECTED NOT DETECTED Final   Candida krusei NOT DETECTED NOT DETECTED Final   Candida parapsilosis NOT DETECTED NOT DETECTED Final   Candida tropicalis NOT DETECTED NOT DETECTED Final  Urine culture     Status: Abnormal   Collection Time: 02/25/18  7:35 PM  Result Value Ref Range Status   Specimen Description URINE, CLEAN CATCH   Final   Special Requests   Final    NONE Performed at [facility], [address], [place], [place] [address]    Culture >=100,000 COLONIES/mL ESCHERICHIA COLI (A)  Final   Report Status 02/28/2018 FINAL  Final   Organism ID, Bacteria ESCHERICHIA COLI (A)  Final      Susceptibility   Escherichia coli - MIC*    CEFAZOLIN <=4 SENSITIVE Sensitive     CEFTRIAXONE <=1 SENSITIVE Sensitive     CIPROFLOXACIN >=4 RESISTANT Resistant     GENTAMICIN <=1 SENSITIVE Sensitive     IMIPENEM <=0.25 SENSITIVE Sensitive     NITROFURANTOIN <=16 SENSITIVE Sensitive     TRIMETH/SULFA <=20 SENSITIVE Sensitive     AMPICILLIN/SULBACTAM 4 SENSITIVE Sensitive     PIP/TAZO <=4 SENSITIVE Sensitive     Extended ESBL NEGATIVE Sensitive     * >=100,000 COLONIES/mL ESCHERICHIA COLI  Culture, blood (Routine x 2)     Status: None (Preliminary result)   Collection Time: 02/25/18  7:43 PM  Result Value Ref Range Status   Specimen Description BLOOD RIGHT FOREARM  Final   Special Requests   Final    BOTTLES DRAWN AEROBIC AND ANAEROBIC Blood Culture adequate volume   Culture   Final    NO GROWTH 2 DAYS Performed at [facility], [address], [place], [place] [address]    Report Status PENDING  Incomplete  MRSA PCR Screening     Status: None   Collection Time: 02/26/18 12:05 AM  Result Value Ref Range Status   MRSA by PCR NEGATIVE NEGATIVE Final    Comment:        The GeneXpert MRSA Assay (FDA approved for NASAL specimens only), is one component of a comprehensive MRSA colonization surveillance program. It is not intended to diagnose MRSA infection nor to guide  or monitor treatment for MRSA infections. Performed at [facility], [place] [address] [place], [place] [address]          Radiology Studies: No results found.      Scheduled Meds: . atorvastatin  20 mg Oral q1800  . diltiazem  180 mg Oral Daily  . famotidine  20 mg Oral Daily  . insulin aspart  0-15  Units Subcutaneous TID WC  . [START ON 03/01/2018] iron polysaccharides  150 mg Oral Daily  . levothyroxine  100 mcg Oral QAC breakfast  . mouth rinse  15 mL Mouth Rinse BID  . metoprolol succinate  50 mg Oral Daily   Continuous Infusions: . cefTRIAXone (ROCEPHIN)  IV Stopped (02/28/18 0629)     LOS: 3 days    Time spent: 35 minutes    Irine Seal, MD [facility] Pager [phone]  If 7PM-7AM, please contact night-coverage [email or web address] Password [email or web address] 02/28/2018, 11:54 AM

## 2018-03-01 ENCOUNTER — Encounter (HOSPITAL_COMMUNITY): Payer: Self-pay | Admitting: Urology

## 2018-03-01 LAB — BASIC METABOLIC PANEL
Anion gap: 8 (ref 5–15)
BUN: 11 mg/dL (ref 8–23)
CHLORIDE: 103 mmol/L (ref 98–111)
CO2: 32 mmol/L (ref 22–32)
Calcium: 8.7 mg/dL — ABNORMAL LOW (ref 8.9–10.3)
Creatinine, Ser: 0.86 mg/dL (ref 0.44–1.00)
Glucose, Bld: 114 mg/dL — ABNORMAL HIGH (ref 70–99)
POTASSIUM: 4.1 mmol/L (ref 3.5–5.1)
Sodium: 143 mmol/L (ref 135–145)

## 2018-03-01 LAB — CBC
HCT: 31.2 % — ABNORMAL LOW (ref 36.0–46.0)
HEMOGLOBIN: 9.7 g/dL — AB (ref 12.0–15.0)
MCH: 30.6 pg (ref 26.0–34.0)
MCHC: 31.1 g/dL (ref 30.0–36.0)
MCV: 98.4 fL (ref 78.0–100.0)
PLATELETS: 221 10*3/uL (ref 150–400)
RBC: 3.17 MIL/uL — AB (ref 3.87–5.11)
RDW: 14.2 % (ref 11.5–15.5)
WBC: 6.2 10*3/uL (ref 4.0–10.5)

## 2018-03-01 LAB — GLUCOSE, CAPILLARY: Glucose-Capillary: 140 mg/dL — ABNORMAL HIGH (ref 70–99)

## 2018-03-01 MED ORDER — POLYSACCHARIDE IRON COMPLEX 150 MG PO CAPS: 150.0000 mg | ORAL_CAPSULE | Freq: Every day | ORAL | 0 refills | Status: DC

## 2018-03-01 MED ORDER — AMOXICILLIN 500 MG PO CAPS: 500.0000 mg | ORAL_CAPSULE | Freq: Three times a day (TID) | ORAL | 0 refills | Status: AC

## 2018-03-01 NOTE — Evaluation (Signed)
Occupational Therapy Evaluation Patient Details Name: [name] Case MRN: [id number] DOB: December 01, 1931 Today's Date: 03/01/2018    History of Present Illness Pt admitted through ED with R flank pain and with dx of sepsis.  Pt with hx of COPD, CHF, A-fib, and cardiomegaly   Clinical Impression   Pt is at min guard A - sup level with ADL and selfcare. Pt will have 24/7 sup and assist prn at home from family. All education completed and no further acute OT indicated at this time    Follow Up Recommendations  No OT follow up;Supervision - Intermittent    Equipment Recommendations  None recommended by OT    Recommendations for Other Services       Precautions / Restrictions Precautions Precautions: Fall Restrictions Weight Bearing Restrictions: No      Mobility Bed Mobility Overal bed mobility: Needs Assistance Bed Mobility: Supine to Sit     Supine to sit: Supervision        Transfers Overall transfer level: Needs assistance Equipment used: Rolling walker (2 wheeled) Transfers: Sit to/from Stand Sit to Stand: Min guard              Balance Overall balance assessment: Needs assistance Sitting-balance support: No upper extremity supported;Feet supported Sitting balance-Leahy Scale: Good     Standing balance support: Bilateral upper extremity supported;During functional activity;Single extremity supported Standing balance-Leahy Scale: Fair                             ADL either performed or assessed with clinical judgement   ADL                                         General ADL Comments: min guard A - sup for safety     Vision Baseline Vision/History: Wears glasses Wears Glasses: Reading only Patient Visual Report: No change from baseline       Perception     Praxis      Pertinent Vitals/Pain Pain Assessment: No/denies pain     Hand Dominance Right   Extremity/Trunk Assessment Upper Extremity Assessment Upper  Extremity Assessment: Generalized weakness   Lower Extremity Assessment Lower Extremity Assessment: Defer to PT evaluation   Cervical / Trunk Assessment Cervical / Trunk Assessment: Kyphotic   Communication Communication Communication: No difficulties   Cognition Arousal/Alertness: Awake/alert Behavior During Therapy: WFL for tasks assessed/performed;Flat affect Overall Cognitive Status: Within Functional Limits for tasks assessed                                     General Comments       Exercises     Shoulder Instructions      Home Living Family/patient expects to be discharged to:: Private residence Living Arrangements: Other relatives Available Help at Discharge: Family Type of Home: House Home Access: Ramped entrance     Home Layout: One level     Bathroom Shower/Tub: Hospital doctor Toilet: Handicapped height     Home Equipment: Cane - single point;Walker - 2 wheels   Additional Comments: Pt lives with granddaughter who does not work      Prior Functioning/Environment Level of Independence: Independent with assistive device(s)        Comments: used cane to mobilize  OT Problem List: Decreased activity tolerance;Impaired balance (sitting and/or standing)      OT Treatment/Interventions:      OT Goals(Current goals can be found in the care plan section) Acute Rehab OT Goals Patient Stated Goal: Home with granddaughter OT Goal Formulation: With patient  OT Frequency:     Barriers to D/C:    no barriers       Co-evaluation              AM-PAC PT "6 Clicks" Daily Activity     Outcome Measure Help from another person eating meals?: None Help from another person taking care of personal grooming?: A Little Help from another person toileting, which includes using toliet, bedpan, or urinal?: A Little Help from another person bathing (including washing, rinsing, drying)?: A Little Help from another person to  put on and taking off regular upper body clothing?: A Little Help from another person to put on and taking off regular lower body clothing?: A Little 6 Click Score: 19   End of Session Equipment Utilized During Treatment: Gait belt;Rolling walker  Activity Tolerance: Patient tolerated treatment well Patient left: in chair;with call bell/phone within reach  OT Visit Diagnosis: Muscle weakness (generalized) (M62.81)                Time: 0086-7619 OT Time Calculation (min): 19 min Charges:  OT General Charges $OT Visit: 1 Visit OT Evaluation $OT Eval Low Complexity: 1 Low G-Codes: OT G-codes **NOT FOR INPATIENT CLASS** Functional Assessment Tool Used: AM-PAC 6 Clicks Daily Activity     Britt Bottom 03/01/2018, 2:11 PM

## 2018-03-01 NOTE — Care Management Note (Signed)
Case Management Note  Patient Details  Name: [name] Case MRN: [id number] Date of Birth: 1932-03-28  Subjective/Objective:                  hhc needs pt,ot and aide  Action/Plan: [facility]  Expected Discharge Date:                  Expected Discharge Plan:  [place]  In-House Referral:     Discharge planning Services  CM Consult  Post Acute Care Choice:    Choice offered to:  Patient, Adult Children  DME Arranged:    DME Agency:     HH Arranged:  PT, OT, Nurse's Aide [place] Agency:  [place]  Status of Service:  Completed, signed off  If discussed at [place] of Stay Meetings, dates discussed:    Additional Comments:  Leeroy Cha, RN 03/01/2018, 2:13 PM

## 2018-03-01 NOTE — Discharge Summary (Signed)
Physician Discharge Summary  [name] Case YYT:[id number] DOB: 09/01/1932 DOA: 02/25/2018  PCP: Hoyt Koch, MD  Admit date: 02/25/2018 Discharge date: 03/01/2018  Time spent: 60 minutes  Recommendations for Outpatient Follow-up:  1. Follow-up with [facility] in 1 week. 2. Follow-up with Hoyt Koch, MD in 2 weeks.  On follow-up patient will need basic metabolic profile done to follow-up on electrolytes and renal function.  Patient also need a CBC done to follow-up on her iron deficiency anemia.  Patient was started on oral iron supplementation on discharge.   Discharge Diagnoses: Principal Problem:   Sepsis ([place]) Active Problems:   Pyohydronephrosis   Acute pyelonephritis   Hypothyroidism   Morbid obesity ([place])   Essential hypertension   GERD   Atrial fibrillation (HCC)   Type 2 diabetes mellitus without complication (HCC)   Chronic respiratory failure with hypercapnia ([place])   Acute lower UTI   Bacteremia due to Escherichia coli   Urinary tract infection with hematuria   Discharge Condition: Stable and improved.  Diet recommendation: Heart healthy.  Filed Weights   02/25/18 2100 02/27/18 1243 02/28/18 0500  Weight: 97.5 kg (214 lb 15.2 oz) 100 kg (220 lb 7.4 oz) 99.7 kg (219 lb 12.8 oz)    History of present illness:  Per [name] Case is a 84 y.o. 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  Had been feeling weak.  Had one episode of vomiting.  Denied any diarrhea.  Denied chest pain or shortness of breath.  ED Course: In the ER patient was found to be febrile tachycardic and was initially hypotensive.  Blood pressure improved with fluids.  UA was consistent with UTI.  CT abdomen pelvis showed right-sided hydronephrosis with distal  ureteral stone.  On-call urologist Dr. Edwena Blow was consulted and patient was likely to go for stent placement and transferred to [facility] Course:  1 sepsis secondary to pyelonephritis/pyohydronephrosis, UTI Patient was noted to have concerns for obstructive distal urethral stone noted on CT abdomen and pelvis when she presented with right flank pain radiating to the groin region. Status post cystoscopy with right retrograde pyelogram including interpretation, right double-J stent placement per Dr. Karsten Ro 02/26/2018.  On admission patient noted to have a fever with a temperature of 102.7, tachycardia with heart rates as high as 132, tachypnea with respiratory rates as high as 27, borderline blood pressure of 102/49, with a leukocytosis and meeting criteria for sepsis.  Patient has been pancultured with blood cultures growing E. coli.  Urine cultures with > 100,000 E Coli.gram-negative rods.   Per urologist note patient had urine cultures done on 02/23/2018 with cultures growing E. Coli, which was sensitive to all antibiotics except Levaquin and ciprofloxacin.  Patient with recent stent removal 02/23/2018 after she underwent bilateral ureteroscopy and resection of her bilateral renal pelvic masses with first stage on 01/29/2018 and second stage on 02/10/2018.    Blood cultures positive for E. coli which likely seeded from the urine.   Patient was initially placed on IV vancomycin IV Rocephin on admission.  As culture results came back and sensitivities came back it was noted was resistant to the fluoroquinolones.  Patient was subsequently transitioned to oral amoxicillin and be discharged home on 12 more days of oral amoxicillin to complete a  2-week course of antibiotic treatment from stent placement.  Outpatient follow-up with urology.  2.  E. coli bacteremia/complicated UTI Likely seeded from recent UTI 02/23/2018 with cultures positive for E. coli per urology.  Patient was initially  placed on IV vancomycin IV Rocephin initially on admission while cultures were pending.  Blood cultures came back positive for E. coli.  Urinalysis which was done was consistent with a UTI with urine cultures positive for E. coli.  IV vancomycin was subsequently discontinued and patient maintained on IV Rocephin.  Patient was subsequently transitioned to oral amoxicillin and will be discharged home on 12 more days of oral amoxicillin to complete a two-week course of antibiotic treatment.    3.  Atrial fibrillation Apixaban on hold for stent placement which was done on 02/26/2018.    Patient was placed back on home regimen of metoprolol and Cardizem for rate control.  Patient's apixaban was subsequently resumed.  Outpatient follow-up.    4.  Hypothyroidism Maintained on home regimen of Synthroid.    5.  Hypertension Blood pressure on admission was low patient noted to have borderline hypotension and as such antihypertensive medications were initially held during the hospitalization.  As patient blood pressure improved antihypertensive medications were subsequently resumed.  Outpatient follow-up with PCP.    6.  History of diastolic CHF Last EF was in May 2017 with EF of 50 to 55%.  Lasix was initially held on admission, due to hypotension and sepsis.  Metoprolol was resumed.  Patient's Coreg subsequently resumed.  ARB was held.  Patient received a dose of Lasix 20 mg IV x1 during the hospitalization and started back on her home regimen of oral Lasix.  Outpatient follow-up with PCP.    7.  History of bilateral renal pelvic transitional cell Per urology has been managed endoscopically.    Outpatient follow-up with urology.    8.  Morbid obesity  9.  Diabetes mellitus type 2 Hemoglobin A1c was 6.3 on 01/19/2018.  Patient maintained on sliding scale insulin during the hospitalization.  Outpatient follow-up.   10 hypokalemia/hypomagnesemia Repleted.  11.  Iron deficiency anemia Hemoglobin  stabilized at 9.7 from 11.7 on admission.  No overt bleeding.  Felt likely to be a dilutional component.  Patient received a dose of IV Feraheme during the hospitalization and started on Nu iron  150 mg daily.  Outpatient follow-up with PCP.        Procedures:  CT abdomen and pelvis 02/25/2018  Chest x-ray 02/25/2018  Cystoscopy with right retrograde pyelogram including interpretation, right double-J stent placement per Dr. Karsten Ro 02/26/2018      Consultations:  Urology: Dr. Karsten Ro 02/26/2018      Discharge Exam: Vitals:   03/01/18 1253 03/01/18 1435  BP: 120/61   Pulse: (!) 52   Resp: 20   Temp: 98.7 F (37.1 C)   SpO2: 90% 96%    General: NAD Cardiovascular: RRR Respiratory: CTAB  Discharge Instructions   Discharge Instructions    Diet - low sodium heart healthy   Complete by:  As directed    Increase activity slowly   Complete by:  As directed      Allergies as of 03/01/2018      Reactions   Codeine Hives, Rash   In cough syrup preparations      Medication List    STOP taking these medications   ciprofloxacin 500 MG tablet Commonly known as:  CIPRO     TAKE these medications   acetaminophen 500 MG  tablet Commonly known as:  TYLENOL Take 500-1,000 mg by mouth every 8 (eight) hours as needed for moderate pain or headache.   albuterol (2.5 MG/3ML) 0.083% nebulizer solution Commonly known as:  PROVENTIL Take 3 mLs (2.5 mg total) by nebulization every 6 (six) hours as needed for wheezing or shortness of breath.   amoxicillin 500 MG capsule Commonly known as:  AMOXIL Take 1 capsule (500 mg total) by mouth every 8 (eight) hours for 12 days.   apixaban 5 MG Tabs tablet Commonly known as:  ELIQUIS Take 1 tablet (5 mg total) by mouth 2 (two) times daily. Resume once the bleeding has stopped from the surgery for 2 days. What changed:  additional instructions   atorvastatin 20 MG tablet Commonly known as:  LIPITOR TAKE 1 TABLET BY MOUTH DAILY  AT 6 PM   clotrimazole-betamethasone cream Commonly known as:  LOTRISONE Apply 1 application topically See admin instructions. Apply 1 application to vagina two times a week as directed   diltiazem 180 MG 24 hr capsule Commonly known as:  CARDIZEM CD Take 1 capsule (180 mg total) by mouth daily.   furosemide 80 MG tablet Commonly known as:  LASIX TAKE 1/2 TABLET BY MOUTH DAILY   irbesartan 150 MG tablet Commonly known as:  AVAPRO Take 0.5 tablets (75 mg total) by mouth daily.   iron polysaccharides 150 MG capsule Commonly known as:  NIFEREX Take 1 capsule (150 mg total) by mouth daily. Start taking on:  03/02/2018   levothyroxine 100 MCG tablet Commonly known as:  SYNTHROID, LEVOTHROID Take 1 tablet (100 mcg total) by mouth daily. Needs annual visit with labs for further refills What changed:    when to take this  additional instructions   metoprolol succinate 50 MG 24 hr tablet Commonly known as:  TOPROL-XL TAKE 1 TABLET BY MOUTH DAILY WITH OR IMMEDIATELY FOLLOWING A MEAL   ondansetron 4 MG tablet Commonly known as:  ZOFRAN Take 1 tablet (4 mg total) by mouth every 8 (eight) hours as needed for nausea or vomiting.   ranitidine 150 MG tablet Commonly known as:  ZANTAC Take 150 mg by mouth daily as needed for heartburn.      Allergies  Allergen Reactions  . Codeine Hives and Rash    In cough syrup preparations   Follow-up Information    Call [facility].   Why:  For an appointment in ~1 week when you get home. Contact information: [place]       Hoyt Koch, MD. Schedule an appointment as soon as possible for a visit in 2 week(s).   Specialty:  Internal Medicine Contact information: [place] [phone] [phone]            The results of significant diagnostics from this hospitalization (including imaging, microbiology, ancillary and  laboratory) are listed below for reference.    Significant Diagnostic Studies: Dg Chest 2 View  Result Date: 02/25/2018 CLINICAL DATA:  Right-sided flank pain and abdominal pain.  Fever. EXAM: CHEST - 2 VIEW COMPARISON:  10/26/2017 and 12/06/2017 FINDINGS: Lungs are adequately inflated with minimal prominence of the perihilar markings. No focal lobar consolidation or effusion. Mild stable cardiomegaly. Remainder of the exam is unchanged. IMPRESSION: Stable cardiomegaly with suggestion of minimal vascular congestion. Electronically Signed   By: Marin Olp M.D.   On: 02/25/2018 18:06   Dg Abdomen 1 View  Result Date: 01/31/2018 CLINICAL DATA:  Left flank pain. Patient status post left double-J stent placement 01/29/2018. EXAM: ABDOMEN - 1 VIEW COMPARISON:  CT abdomen and pelvis 01/07/2018. Intraoperative imaging 01/29/2018. FINDINGS: Bilateral double-J ureteral stents are in place and project in good position. No evidence of urinary tract stone is identified. Bowel gas pattern is normal. No acute bony abnormality. IMPRESSION: No acute finding. Bilateral double-J ureteral stones project in good position. Electronically Signed   By: Inge Rise M.D.   On: 01/31/2018 10:21   Ct Abdomen Pelvis W Contrast  Result Date: 02/25/2018 CLINICAL DATA:  84 year old female with abdominal pain. EXAM: CT ABDOMEN AND PELVIS WITH CONTRAST TECHNIQUE: Multidetector CT imaging of the abdomen and pelvis was performed using the standard protocol following bolus administration of intravenous contrast. CONTRAST:  1100mL OMNIPAQUE IOHEXOL 300 MG/ML  SOLN COMPARISON:  Abdominal CT dated 01/07/2018 FINDINGS: Lower chest: Minimal bibasilar scarring. The visualized lung bases are otherwise clear. There is mild cardiomegaly. No intra-abdominal free air or free fluid. Hepatobiliary: Cholecystectomy. No intrahepatic biliary ductal dilatation. The liver is unremarkable. Pancreas: Fatty infiltration of the pancreas. No dilatation of  the main pancreatic duct. Spleen: Normal in size without focal abnormality. Adrenals/Urinary Tract: The adrenal glands are unremarkable. There is a 7 mm stone in the distal right ureter with mild right hydronephrosis. There is mild fullness of the left ureter. No definite stone identified along the course of the left ureter. There is stranding of the right perinephric and bilateral periureteric fat. Correlation with urinalysis recommended to exclude cystitis. Right renal 5 cm upper pole cyst. Small amount of air within the urinary bladder likely related to recent instrumentation. Clinical correlation is recommended. Stomach/Bowel: There multiple duodenal diverticula measuring up to 3 cm without active inflammatory changes. There is sigmoid diverticulosis and scattered colonic diverticula without active inflammatory changes. There is no bowel obstruction or active inflammation. Appendectomy. Vascular/Lymphatic: Moderate aortoiliac atherosclerotic disease. No portal venous gas. There is no adenopathy. Reproductive: Hysterectomy.  No pelvic mass. Other: Small fat containing umbilical hernia.  No fluid collection. Musculoskeletal: Bilateral L5 pars defects. No acute osseous pathology. There are degenerative changes of the spine with disc desiccation. IMPRESSION: 1. A 7 mm distal right ureteral calculus with mild right hydronephrosis. Correlation with urinalysis recommended to exclude superimposed UTI. 2. Colonic diverticulosis as well as multiple duodenal diverticula. No bowel obstruction or active inflammation. 3.  Aortic Atherosclerosis (ICD10-I70.0). Electronically Signed   By: Anner Crete M.D.   On: 02/25/2018 21:18   Dg C-arm 1-60 Min-no Report  Result Date: 02/26/2018 Fluoroscopy was utilized by the requesting physician.  No radiographic interpretation.   Dg C-arm 1-60 Min-no Report  Result Date: 02/10/2018 Fluoroscopy was utilized by the requesting physician.  No radiographic interpretation.     Microbiology: Recent Results (from the past 240 hour(s))  Culture, blood (Routine x 2)     Status: Abnormal   Collection Time: 02/25/18  5:37 PM  Result Value Ref Range Status   Specimen Description BLOOD LEFT ARM  Final   Special Requests   Final    BOTTLES DRAWN AEROBIC AND ANAEROBIC Blood Culture adequate volume   Culture  Setup Time   Final    GRAM NEGATIVE RODS AEROBIC BOTTLE ONLY CRITICAL RESULT CALLED TO, READ BACK BY AND VERIFIED WITH: Guadlupe Spanish PharmD 10:10 02/26/18 ([email or web address]) Performed at [facility], [address], [place], [place] [address]    Culture ESCHERICHIA COLI (A)  Final   Report Status 02/28/2018 FINAL  Final   Organism ID, Bacteria  ESCHERICHIA COLI  Final      Susceptibility   Escherichia coli - MIC*    AMPICILLIN 8 SENSITIVE Sensitive     CEFAZOLIN <=4 SENSITIVE Sensitive     CEFEPIME <=1 SENSITIVE Sensitive     CEFTAZIDIME <=1 SENSITIVE Sensitive     CEFTRIAXONE <=1 SENSITIVE Sensitive     CIPROFLOXACIN >=4 RESISTANT Resistant     GENTAMICIN <=1 SENSITIVE Sensitive     IMIPENEM <=0.25 SENSITIVE Sensitive     TRIMETH/SULFA <=20 SENSITIVE Sensitive     AMPICILLIN/SULBACTAM 4 SENSITIVE Sensitive     PIP/TAZO <=4 SENSITIVE Sensitive     Extended ESBL NEGATIVE Sensitive     * ESCHERICHIA COLI  Blood Culture ID Panel (Reflexed)     Status: Abnormal   Collection Time: 02/25/18  5:37 PM  Result Value Ref Range Status   Enterococcus species NOT DETECTED NOT DETECTED Final   Listeria monocytogenes NOT DETECTED NOT DETECTED Final   Staphylococcus species NOT DETECTED NOT DETECTED Final   Staphylococcus aureus NOT DETECTED NOT DETECTED Final   Streptococcus species NOT DETECTED NOT DETECTED Final   Streptococcus agalactiae NOT DETECTED NOT DETECTED Final   Streptococcus pneumoniae NOT DETECTED NOT DETECTED Final   Streptococcus pyogenes NOT DETECTED NOT DETECTED Final   Acinetobacter baumannii NOT DETECTED NOT DETECTED Final   Enterobacteriaceae  species DETECTED (A) NOT DETECTED Final    Comment: Enterobacteriaceae represent a large family of gram-negative bacteria, not a single organism. CRITICAL RESULT CALLED TO, READ BACK BY AND VERIFIED WITH: Guadlupe Spanish PharmD 10:10 02/26/18 ([email or web address])    Enterobacter cloacae complex NOT DETECTED NOT DETECTED Final   Escherichia coli DETECTED (A) NOT DETECTED Final    Comment: CRITICAL RESULT CALLED TO, READ BACK BY AND VERIFIED WITH: Guadlupe Spanish PharmD 10:10 02/26/18 ([email or web address])    Klebsiella oxytoca NOT DETECTED NOT DETECTED Final   Klebsiella pneumoniae NOT DETECTED NOT DETECTED Final   Proteus species NOT DETECTED NOT DETECTED Final   Serratia marcescens NOT DETECTED NOT DETECTED Final   Carbapenem resistance NOT DETECTED NOT DETECTED Final   Haemophilus influenzae NOT DETECTED NOT DETECTED Final   Neisseria meningitidis NOT DETECTED NOT DETECTED Final   Pseudomonas aeruginosa NOT DETECTED NOT DETECTED Final   Candida albicans NOT DETECTED NOT DETECTED Final   Candida glabrata NOT DETECTED NOT DETECTED Final   Candida krusei NOT DETECTED NOT DETECTED Final   Candida parapsilosis NOT DETECTED NOT DETECTED Final   Candida tropicalis NOT DETECTED NOT DETECTED Final  Urine culture     Status: Abnormal   Collection Time: 02/25/18  7:35 PM  Result Value Ref Range Status   Specimen Description URINE, CLEAN CATCH  Final   Special Requests   Final    NONE Performed at [facility], [address], [place], [place] [address]    Culture >=100,000 COLONIES/mL ESCHERICHIA COLI (A)  Final   Report Status 02/28/2018 FINAL  Final   Organism ID, Bacteria ESCHERICHIA COLI (A)  Final      Susceptibility   Escherichia coli - MIC*    CEFAZOLIN <=4 SENSITIVE Sensitive     CEFTRIAXONE <=1 SENSITIVE Sensitive     CIPROFLOXACIN >=4 RESISTANT Resistant     GENTAMICIN <=1 SENSITIVE Sensitive     IMIPENEM <=0.25 SENSITIVE Sensitive     NITROFURANTOIN <=16 SENSITIVE Sensitive     TRIMETH/SULFA <=20  SENSITIVE Sensitive     AMPICILLIN/SULBACTAM 4 SENSITIVE Sensitive     PIP/TAZO <=4 SENSITIVE Sensitive     Extended ESBL  NEGATIVE Sensitive     * >=100,000 COLONIES/mL ESCHERICHIA COLI  Culture, blood (Routine x 2)     Status: None (Preliminary result)   Collection Time: 02/25/18  7:43 PM  Result Value Ref Range Status   Specimen Description BLOOD RIGHT FOREARM  Final   Special Requests   Final    BOTTLES DRAWN AEROBIC AND ANAEROBIC Blood Culture adequate volume   Culture   Final    NO GROWTH 4 DAYS Performed at [facility], [place] [address], [place], [place] [address]    Report Status PENDING  Incomplete  MRSA PCR Screening     Status: None   Collection Time: 02/26/18 12:05 AM  Result Value Ref Range Status   MRSA by PCR NEGATIVE NEGATIVE Final    Comment:        The GeneXpert MRSA Assay (FDA approved for NASAL specimens only), is one component of a comprehensive MRSA colonization surveillance program. It is not intended to diagnose MRSA infection nor to guide or monitor treatment for MRSA infections. Performed at [facility], [place] [address] [place], [place] [address]      Labs: Basic Metabolic Panel: Recent Labs  Lab 02/25/18 1737 02/26/18 0823 02/27/18 0311 02/28/18 0423 03/01/18 0425  NA 138 140 142 141 143  K 3.3* 3.1* 4.2 3.7 4.1  CL 100 101 106 105 103  CO2 27 32 31 31 32  GLUCOSE 129* 121* 107* 107* 114*  BUN 10 13 12 11 11   CREATININE 0.92 0.89 0.92 0.84 0.86  CALCIUM 8.7* 8.1* 8.1* 8.3* 8.7*  MG  --  1.2* 2.3 2.1  --    Liver Function Tests: Recent Labs  Lab 02/25/18 1737 02/26/18 0823  AST 21 14*  ALT 19 16  ALKPHOS 74 61  BILITOT 0.8 0.7  PROT 6.2* 5.6*  ALBUMIN 3.3* 2.6*   No results for input(s): LIPASE, AMYLASE in the last 168 hours. No results for input(s): AMMONIA in the last 168 hours. CBC: Recent Labs  Lab 02/25/18 1737 02/26/18 0823 02/27/18 0311 02/28/18 0423 03/01/18 0425  WBC 11.5* 11.7* 9.8  8.0 6.2  NEUTROABS 9.2* 9.2* 7.3 5.8  --   HGB 11.7* 10.5* 9.6* 9.7* 9.7*  HCT 37.9 32.9* 30.3* 31.0* 31.2*  MCV 97.9 97.3 98.1 98.1 98.4  PLT 240 199 190 208 221   Cardiac Enzymes: No results for input(s): CKTOTAL, CKMB, CKMBINDEX, TROPONINI in the last 168 hours. BNP: BNP (last 3 results) Recent Labs    10/27/17 0131  BNP 247.9*    ProBNP (last 3 results) No results for input(s): PROBNP in the last 8760 hours.  CBG: Recent Labs  Lab 02/28/18 0726 02/28/18 1142 02/28/18 1656 02/28/18 2057 03/01/18 1113  GLUCAP 110* 157* 80 131* 140*       Signed:  Irine Seal MD.  [facility] 03/01/2018, 4:32 PM

## 2018-03-01 NOTE — Care Management Important Message (Signed)
Important Message  Patient Details  Name: TONILYNN BIEKER MRN: [id number] Date of Birth: [birth year]   Medicare Important Message Given:  Yes    Kerin Salen 03/01/2018, 11:39 AMImportant Message  Patient Details  Name: JENETTE RAYSON MRN: [id number] Date of Birth: May 21, 1932   Medicare Important Message Given:  Yes    Kerin Salen 03/01/2018, 11:39 AM

## 2018-03-02 ENCOUNTER — Telehealth: Payer: Self-pay | Admitting: *Deleted

## 2018-03-02 LAB — CULTURE, BLOOD (ROUTINE X 2)
Culture: NO GROWTH
Special Requests: ADEQUATE

## 2018-03-02 NOTE — Telephone Encounter (Signed)
Pt was on the TCM report per chart hosp appt has been made for 03/15/18. Called pt to confirm and complete TCM call.[name] Case Transition Care Management Follow-up Telephone Call   Date discharged? 03/01/18   How have you been since you were released from the hospital? Pt states she is feeling a little better   Do you understand why you were in the hospital? YES   Do you understand the discharge instructions? YES   Where were you discharged to? Home   Items Reviewed:  Medications reviewed: YES  Allergies reviewed: YES  Dietary changes reviewed: YES, heart healthy  Referrals reviewed: YES   Functional Questionnaire:   Activities of Daily Living (ADLs):   She states she are independent in the following: bathing and hygiene, feeding, grooming, toileting and dressing States they require assistance with the following: ambulation and continence   Any transportation issues/concerns?: NO   Any patient concerns? NO   Confirmed importance and date/time of follow-up visits scheduled YES, appt 03/15/18  Provider Appointment booked with Dr. Sharlet Salina   Confirmed with patient if condition begins to worsen call PCP or go to the ER.  Patient was given the office number and encouraged to call back with question or concerns.  : YES

## 2018-03-04 ENCOUNTER — Emergency Department (HOSPITAL_COMMUNITY): Payer: Medicare Other

## 2018-03-04 ENCOUNTER — Other Ambulatory Visit: Payer: Self-pay

## 2018-03-04 ENCOUNTER — Encounter (HOSPITAL_COMMUNITY): Payer: Self-pay | Admitting: Emergency Medicine

## 2018-03-04 ENCOUNTER — Emergency Department (HOSPITAL_COMMUNITY)
Admission: EM | Admit: 2018-03-04 | Discharge: 2018-03-05 | Disposition: A | Discharge status: Home / Self Care | Payer: Medicare Other | Attending: Emergency Medicine | Admitting: Emergency Medicine

## 2018-03-04 DIAGNOSIS — I110 Hypertensive heart disease with heart failure: Secondary | ICD-10-CM | POA: Insufficient documentation

## 2018-03-04 DIAGNOSIS — Z85528 Personal history of other malignant neoplasm of kidney: Secondary | ICD-10-CM | POA: Diagnosis not present

## 2018-03-04 DIAGNOSIS — Z7901 Long term (current) use of anticoagulants: Secondary | ICD-10-CM | POA: Diagnosis not present

## 2018-03-04 DIAGNOSIS — J45909 Unspecified asthma, uncomplicated: Secondary | ICD-10-CM | POA: Insufficient documentation

## 2018-03-04 DIAGNOSIS — R531 Weakness: Secondary | ICD-10-CM | POA: Diagnosis present

## 2018-03-04 DIAGNOSIS — Z79899 Other long term (current) drug therapy: Secondary | ICD-10-CM | POA: Insufficient documentation

## 2018-03-04 DIAGNOSIS — R5381 Other malaise: Secondary | ICD-10-CM | POA: Insufficient documentation

## 2018-03-04 DIAGNOSIS — R109 Unspecified abdominal pain: Secondary | ICD-10-CM | POA: Diagnosis not present

## 2018-03-04 DIAGNOSIS — E039 Hypothyroidism, unspecified: Secondary | ICD-10-CM | POA: Diagnosis not present

## 2018-03-04 DIAGNOSIS — R05 Cough: Secondary | ICD-10-CM | POA: Diagnosis not present

## 2018-03-04 DIAGNOSIS — E119 Type 2 diabetes mellitus without complications: Secondary | ICD-10-CM | POA: Diagnosis not present

## 2018-03-04 DIAGNOSIS — Z466 Encounter for fitting and adjustment of urinary device: Secondary | ICD-10-CM | POA: Diagnosis not present

## 2018-03-04 DIAGNOSIS — R0602 Shortness of breath: Secondary | ICD-10-CM | POA: Diagnosis not present

## 2018-03-04 DIAGNOSIS — I509 Heart failure, unspecified: Secondary | ICD-10-CM | POA: Insufficient documentation

## 2018-03-04 LAB — CBC WITH DIFFERENTIAL/PLATELET
Basophils Absolute: 0.1 10*3/uL (ref 0.0–0.1)
Basophils Relative: 1 %
EOS PCT: 6 %
Eosinophils Absolute: 0.4 10*3/uL (ref 0.0–0.7)
HCT: 35.7 % — ABNORMAL LOW (ref 36.0–46.0)
Hemoglobin: 11.3 g/dL — ABNORMAL LOW (ref 12.0–15.0)
LYMPHS ABS: 1.9 10*3/uL (ref 0.7–4.0)
LYMPHS PCT: 27 %
MCH: 30.6 pg (ref 26.0–34.0)
MCHC: 31.7 g/dL (ref 30.0–36.0)
MCV: 96.7 fL (ref 78.0–100.0)
MONO ABS: 0.6 10*3/uL (ref 0.1–1.0)
MONOS PCT: 9 %
Neutro Abs: 4.1 10*3/uL (ref 1.7–7.7)
Neutrophils Relative %: 57 %
PLATELETS: 299 10*3/uL (ref 150–400)
RBC: 3.69 MIL/uL — AB (ref 3.87–5.11)
RDW: 14 % (ref 11.5–15.5)
WBC: 7 10*3/uL (ref 4.0–10.5)

## 2018-03-04 LAB — COMPREHENSIVE METABOLIC PANEL
ALT: 26 U/L (ref 0–44)
ANION GAP: 9 (ref 5–15)
AST: 22 U/L (ref 15–41)
Albumin: 3.1 g/dL — ABNORMAL LOW (ref 3.5–5.0)
Alkaline Phosphatase: 72 U/L (ref 38–126)
BUN: 13 mg/dL (ref 8–23)
CHLORIDE: 101 mmol/L (ref 98–111)
CO2: 33 mmol/L — ABNORMAL HIGH (ref 22–32)
CREATININE: 0.86 mg/dL (ref 0.44–1.00)
Calcium: 9.1 mg/dL (ref 8.9–10.3)
Glucose, Bld: 136 mg/dL — ABNORMAL HIGH (ref 70–99)
Potassium: 3 mmol/L — ABNORMAL LOW (ref 3.5–5.1)
Sodium: 143 mmol/L (ref 135–145)
Total Bilirubin: 0.5 mg/dL (ref 0.3–1.2)
Total Protein: 7 g/dL (ref 6.5–8.1)

## 2018-03-04 NOTE — ED Notes (Signed)
Pt back from radiology 

## 2018-03-04 NOTE — ED Triage Notes (Signed)
Patient complaining of lower abdominal pain. Patient states she is on an antibiotic for uti but she dont think it is working. Patient states she dont feel good.

## 2018-03-04 NOTE — ED Provider Notes (Signed)
[facility] DEPT Provider Note   CSN: [id number] Arrival date & time: 03/04/18  2156     History   Chief Complaint Chief Complaint  Patient presents with  . Abdominal Pain    HPI [name] Case is a 84 y.o. female.  Patient with a history of atrial fibrillation on Eliquis, bilateral renal pelvic CA, CHF, COPD, DM, GERD, kidney stones, with recent admission for urosepsis, pyohydronephrosis s/p right stent placement, presents with complaint of chills, generalized weakness, "I don't feel well". No fever, nausea, vomiting, urinary frequency, dysuria or hematuria. She reports right flank pain but this is unchanged from recent hospitalization. She states she feels short of breath and has a mild, non-productive cough which is new. No significant congestion.    The history is provided by the patient. No language interpreter was used.    Past Medical History:  Diagnosis Date  . Allergy   . Arthritis   . Atrial fibrillation ([place])   . Bronchitis   . Cancer ([place])    Bilateral upper tract transitional cell carcinoma  . Cardiomegaly   . CHF (congestive heart failure) ([place])   . Complication of anesthesia    Difficult to arouse  . COPD (chronic obstructive pulmonary disease) ([facility])    borderline   . Diabetes mellitus without complication ([place])    Controlled by diet and exercise  . Diverticulosis of sigmoid colon   . Fatty liver   . GERD (gastroesophageal reflux disease)   . History of hiatal hernia 01/20/2015   Small sliding, noted on CT  . History of kidney stones   . Hyperlipidemia   . Hypertension   . Hypothyroidism   . PONV (postoperative nausea and vomiting)   . Supraumbilical hernia 13/24/4010   Small noted on CT  . Thyroid disease   . UTI (urinary tract infection)     Patient Active Problem List   Diagnosis Date Noted  . Acute pyelonephritis 02/26/2018  . Acute lower UTI 02/26/2018  . Bacteremia due to Escherichia coli 02/26/2018  .  Urinary tract infection with hematuria   . Sepsis ([place]) 02/25/2018  . Uncontrolled pain 02/10/2018  . Pyohydronephrosis 12/04/2017  . Lichen planus 27/25/3664  . Acute respiratory failure with hypoxia ([place])   . Obesity hypoventilation syndrome ([place])   . Chronic CHF (congestive heart failure) ([place]) 01/09/2016  . Atrial fibrillation with RVR ([place]) 01/09/2016  . History of snoring 05/20/2015  . Chronic respiratory failure with hypercapnia ([place])   . Type 2 diabetes mellitus without complication ([place])   . Atrial fibrillation ([place]) 01/06/2015  . Hypothyroidism 03/09/2007  . Hyperlipidemia 03/09/2007  . Morbid obesity ([place]) 03/09/2007  . Essential hypertension 03/09/2007  . ALLERGIC RHINITIS 03/09/2007  . Asthma 03/09/2007  . GERD 03/09/2007  . INCONTINENCE 03/09/2007    Past Surgical History:  Procedure Laterality Date  . ABDOMINAL HYSTERECTOMY    . APPENDECTOMY    . CHOLECYSTECTOMY    . COLONOSCOPY  07/16/2009  . CYSTOSCOPY W/ URETERAL STENT PLACEMENT Right 12/04/2017   Procedure: CYSTOSCOPY WITH RETROGRADE PYELOGRAM/URETERAL STENT PLACEMENT;  Surgeon: Festus Aloe, MD;  Location: [facility];  Service: Urology;  Laterality: Right;  . CYSTOSCOPY W/ URETERAL STENT PLACEMENT Right 02/26/2018   Procedure: CYSTOSCOPY WITH RETROGRADE PYELOGRAM/URETERAL STENT PLACEMENT;  Surgeon: Kathie Rhodes, MD;  Location: [facility];  Service: Urology;  Laterality: Right;  . CYSTOSCOPY WITH URETEROSCOPY AND STENT PLACEMENT Bilateral 01/29/2018   Procedure: BILATERAL URETEROSCOPY AND TUMOR RESECTION  WITH LASER RIGHT STENT EXCHANGE LEFT  STENT PLACEMENT;  Surgeon: Ardis Hughs, MD;  Location: [facility];  Service: Urology;  Laterality: Bilateral;  . CYSTOSCOPY WITH URETEROSCOPY AND STENT PLACEMENT Bilateral 02/10/2018   Procedure: BILATERAL URETEROSCOPY AND TUMOR EXCISION BILATERAL STENT EXCHANGE;  Surgeon: Ardis Hughs, MD;  Location: [facility];  Service: Urology;  Laterality: Bilateral;  .  CYSTOSCOPY/RETROGRADE/URETEROSCOPY Bilateral 12/24/2017   Procedure: BILATERAL URETEROSCOPY, RIGHT STENT EXCHANGE, STONE EXTRACTION WITH BASKET BILATERAL RETROGRADE PYELOGRAM, BIOPSY OF RIGHT UPPER POLE TUMOR,  BILATERAL RENAL PELVIS FLUID SENT FOR CYTOLOGY;  Surgeon: Ardis Hughs, MD;  Location: [facility];  Service: Urology;  Laterality: Bilateral;  . KNEE SURGERY Right   . KNEE SURGERY Left    torn ligament repair  . UPPER GI ENDOSCOPY       OB History   None      Home Medications    Prior to Admission medications   Medication Sig Start Date End Date Taking? Authorizing Provider  acetaminophen (TYLENOL) 500 MG tablet Take 500-1,000 mg by mouth every 8 (eight) hours as needed for moderate pain or headache.     [provider]  albuterol (PROVENTIL) (2.5 MG/3ML) 0.083% nebulizer solution Take 3 mLs (2.5 mg total) by nebulization every 6 (six) hours as needed for wheezing or shortness of breath. 10/28/17   Danford, Suann Larry, MD  amoxicillin (AMOXIL) 500 MG capsule Take 1 capsule (500 mg total) by mouth every 8 (eight) hours for 12 days. 03/01/18 03/13/18  Eugenie Filler, MD  apixaban (ELIQUIS) 5 MG TABS tablet Take 1 tablet (5 mg total) by mouth 2 (two) times daily. Resume once the bleeding has stopped from the surgery for 2 days. Patient taking differently: Take 5 mg by mouth 2 (two) times daily.  02/10/18   Ardis Hughs, MD  atorvastatin (LIPITOR) 20 MG tablet TAKE 1 TABLET BY MOUTH DAILY AT 6 PM 01/26/18   Hoyt Koch, MD  clotrimazole-betamethasone (LOTRISONE) cream Apply 1 application topically See admin instructions. Apply 1 application to vagina two times a week as directed 02/22/18   [provider]  diltiazem (CARDIZEM CD) 180 MG 24 hr capsule Take 1 capsule (180 mg total) by mouth daily. 10/15/17   Hoyt Koch, MD  furosemide (LASIX) 80 MG tablet TAKE 1/2 TABLET BY MOUTH DAILY 12/08/17   Hoyt Koch, MD  irbesartan (AVAPRO)  150 MG tablet Take 0.5 tablets (75 mg total) by mouth daily. 06/03/17   Hoyt Koch, MD  iron polysaccharides (NIFEREX) 150 MG capsule Take 1 capsule (150 mg total) by mouth daily. 03/02/18   Eugenie Filler, MD  levothyroxine (SYNTHROID, LEVOTHROID) 100 MCG tablet Take 1 tablet (100 mcg total) by mouth daily. Needs annual visit with labs for further refills Patient taking differently: Take 100 mcg by mouth daily before breakfast.  12/17/17   Hoyt Koch, MD  metoprolol succinate (TOPROL-XL) 50 MG 24 hr tablet TAKE 1 TABLET BY MOUTH DAILY WITH OR IMMEDIATELY FOLLOWING A MEAL 02/11/18   Jerline Pain, MD  ondansetron (ZOFRAN) 4 MG tablet Take 1 tablet (4 mg total) by mouth every 8 (eight) hours as needed for nausea or vomiting. 02/11/18   Ardis Hughs, MD  ranitidine (ZANTAC) 150 MG tablet Take 150 mg by mouth daily as needed for heartburn.    [provider]    Family History Family History  Problem Relation Age of Onset  . Heart disease Mother   . Colon cancer Mother     Social  History Social History   Tobacco Use  . Smoking status: Never Smoker  . Smokeless tobacco: Never Used  Substance Use Topics  . Alcohol use: No    Alcohol/week: 0.0 oz  . Drug use: No     Allergies   Codeine   Review of Systems Review of Systems  Constitutional: Positive for chills and fatigue. Negative for appetite change and fever.  HENT: Negative.  Negative for congestion.   Respiratory: Positive for cough and shortness of breath.   Cardiovascular: Negative.  Negative for chest pain.  Gastrointestinal: Negative.  Negative for abdominal pain, nausea and vomiting.  Genitourinary: Positive for flank pain. Negative for dysuria, frequency and hematuria.  Musculoskeletal: Negative for myalgias.  Skin: Negative.   Neurological: Positive for weakness.     Physical Exam Updated Vital Signs BP (!) 125/56 (BP Location: Right Arm)   Pulse 81   Temp 98 F (36.7 C)  (Oral)   Resp 14   Ht 5' (1.524 m)   Wt 97.5 kg (215 lb)   LMP  (LMP Unknown)   SpO2 93%   BMI 41.99 kg/m   Physical Exam  Constitutional: She appears well-developed and well-nourished.  HENT:  Head: Normocephalic.  Neck: Normal range of motion. Neck supple.  Cardiovascular: Normal rate and regular rhythm.  Pulmonary/Chest: Effort normal and breath sounds normal.  Abdominal: Soft. There is no tenderness. There is no rebound, no guarding and no CVA tenderness.  Musculoskeletal: Normal range of motion.  Neurological: She is alert. No cranial nerve deficit.  Skin: Skin is warm and dry. No rash noted.  Psychiatric: She has a normal mood and affect.     ED Treatments / Results  Labs (all labs ordered are listed, but only abnormal results are displayed) Labs Reviewed  URINE CULTURE  CBC WITH DIFFERENTIAL/PLATELET  COMPREHENSIVE METABOLIC PANEL  URINALYSIS, ROUTINE W REFLEX MICROSCOPIC   Results for orders placed or performed during the hospital encounter of 03/04/18  CBC with Differential  Result Value Ref Range   WBC 7.0 4.0 - 10.5 K/uL   RBC 3.69 (L) 3.87 - 5.11 MIL/uL   Hemoglobin 11.3 (L) 12.0 - 15.0 g/dL   HCT 35.7 (L) 36.0 - 46.0 %   MCV 96.7 78.0 - 100.0 fL   MCH 30.6 26.0 - 34.0 pg   MCHC 31.7 30.0 - 36.0 g/dL   RDW 14.0 11.5 - 15.5 %   Platelets 299 150 - 400 K/uL   Neutrophils Relative % 57 %   Neutro Abs 4.1 1.7 - 7.7 K/uL   Lymphocytes Relative 27 %   Lymphs Abs 1.9 0.7 - 4.0 K/uL   Monocytes Relative 9 %   Monocytes Absolute 0.6 0.1 - 1.0 K/uL   Eosinophils Relative 6 %   Eosinophils Absolute 0.4 0.0 - 0.7 K/uL   Basophils Relative 1 %   Basophils Absolute 0.1 0.0 - 0.1 K/uL  Comprehensive metabolic panel  Result Value Ref Range   Sodium 143 135 - 145 mmol/L   Potassium 3.0 (L) 3.5 - 5.1 mmol/L   Chloride 101 98 - 111 mmol/L   CO2 33 (H) 22 - 32 mmol/L   Glucose, Bld 136 (H) 70 - 99 mg/dL   BUN 13 8 - 23 mg/dL   Creatinine, Ser 0.86 0.44 - 1.00  mg/dL   Calcium 9.1 8.9 - 10.3 mg/dL   Total Protein 7.0 6.5 - 8.1 g/dL   Albumin 3.1 (L) 3.5 - 5.0 g/dL   AST 22 15 - 41  U/L   ALT 26 0 - 44 U/L   Alkaline Phosphatase 72 38 - 126 U/L   Total Bilirubin 0.5 0.3 - 1.2 mg/dL   GFR calc non Af Amer >60 >60 mL/min   GFR calc Af Amer >60 >60 mL/min   Anion gap 9 5 - 15  Urinalysis, Routine w reflex microscopic  Result Value Ref Range   Color, Urine YELLOW YELLOW   APPearance CLOUDY (A) CLEAR   Specific Gravity, Urine 1.010 1.005 - 1.030   pH 7.0 5.0 - 8.0   Glucose, UA NEGATIVE NEGATIVE mg/dL   Hgb urine dipstick MODERATE (A) NEGATIVE   Bilirubin Urine NEGATIVE NEGATIVE   Ketones, ur NEGATIVE NEGATIVE mg/dL   Protein, ur 30 (A) NEGATIVE mg/dL   Nitrite NEGATIVE NEGATIVE   Leukocytes, UA MODERATE (A) NEGATIVE   RBC / HPF 0-5 0 - 5 RBC/hpf   WBC, UA 21-50 0 - 5 WBC/hpf   Bacteria, UA RARE (A) NONE SEEN   Squamous Epithelial / LPF 11-20 0 - 5   Mucus PRESENT    Hyaline Casts, UA PRESENT     EKG None  Radiology No results found. Dg Chest 2 View  Result Date: 03/04/2018 CLINICAL DATA:  Short of breath EXAM: CHEST - 2 VIEW COMPARISON:  02/25/2018, 12/06/2017 FINDINGS: No acute consolidation or pleural effusion. Mild cardiomegaly. No pneumothorax. IMPRESSION: No active cardiopulmonary disease.  Mild cardiomegaly. Electronically Signed   By: Donavan Foil M.D.   On: 03/04/2018 22:45   Dg Chest 2 View  Result Date: 02/25/2018 CLINICAL DATA:  Right-sided flank pain and abdominal pain.  Fever. EXAM: CHEST - 2 VIEW COMPARISON:  10/26/2017 and 12/06/2017 FINDINGS: Lungs are adequately inflated with minimal prominence of the perihilar markings. No focal lobar consolidation or effusion. Mild stable cardiomegaly. Remainder of the exam is unchanged. IMPRESSION: Stable cardiomegaly with suggestion of minimal vascular congestion. Electronically Signed   By: Marin Olp M.D.   On: 02/25/2018 18:06   Ct Abdomen Pelvis W Contrast  Result Date:  02/25/2018 CLINICAL DATA:  84 year old female with abdominal pain. EXAM: CT ABDOMEN AND PELVIS WITH CONTRAST TECHNIQUE: Multidetector CT imaging of the abdomen and pelvis was performed using the standard protocol following bolus administration of intravenous contrast. CONTRAST:  180mL OMNIPAQUE IOHEXOL 300 MG/ML  SOLN COMPARISON:  Abdominal CT dated 01/07/2018 FINDINGS: Lower chest: Minimal bibasilar scarring. The visualized lung bases are otherwise clear. There is mild cardiomegaly. No intra-abdominal free air or free fluid. Hepatobiliary: Cholecystectomy. No intrahepatic biliary ductal dilatation. The liver is unremarkable. Pancreas: Fatty infiltration of the pancreas. No dilatation of the main pancreatic duct. Spleen: Normal in size without focal abnormality. Adrenals/Urinary Tract: The adrenal glands are unremarkable. There is a 7 mm stone in the distal right ureter with mild right hydronephrosis. There is mild fullness of the left ureter. No definite stone identified along the course of the left ureter. There is stranding of the right perinephric and bilateral periureteric fat. Correlation with urinalysis recommended to exclude cystitis. Right renal 5 cm upper pole cyst. Small amount of air within the urinary bladder likely related to recent instrumentation. Clinical correlation is recommended. Stomach/Bowel: There multiple duodenal diverticula measuring up to 3 cm without active inflammatory changes. There is sigmoid diverticulosis and scattered colonic diverticula without active inflammatory changes. There is no bowel obstruction or active inflammation. Appendectomy. Vascular/Lymphatic: Moderate aortoiliac atherosclerotic disease. No portal venous gas. There is no adenopathy. Reproductive: Hysterectomy.  No pelvic mass. Other: Small fat containing umbilical hernia.  No  fluid collection. Musculoskeletal: Bilateral L5 pars defects. No acute osseous pathology. There are degenerative changes of the spine with  disc desiccation. IMPRESSION: 1. A 7 mm distal right ureteral calculus with mild right hydronephrosis. Correlation with urinalysis recommended to exclude superimposed UTI. 2. Colonic diverticulosis as well as multiple duodenal diverticula. No bowel obstruction or active inflammation. 3.  Aortic Atherosclerosis (ICD10-I70.0). Electronically Signed   By: Anner Crete M.D.   On: 02/25/2018 21:18   Dg C-arm 1-60 Min-no Report  Result Date: 02/26/2018 Fluoroscopy was utilized by the requesting physician.  No radiographic interpretation.   Dg C-arm 1-60 Min-no Report  Result Date: 02/10/2018 Fluoroscopy was utilized by the requesting physician.  No radiographic interpretation.    Procedures Procedures (including critical care time)  Medications Ordered in ED Medications - No data to display   Initial Impression / Assessment and Plan / ED Course  I have reviewed the triage vital signs and the nursing notes.  Pertinent labs & imaging results that were available during my care of the patient were reviewed by me and considered in my medical decision making (see chart for details).     Patient presents with complaint of feeling generally weak over the last 1-2 days. No falls. She is having flank pain on right but reports this is unchanged from recent hospitalization for urosepsis with right ureterolithiasis and subsequent stent placement. Stent due to the removed tomorrow. No fever, nausea, vomiting. She is eating and drinking as usual.   Labs and imaging are nonconcerning. Rare bacteria in her urine. No fever, normal vital signs.   She is felt appropriate for discharge home. She is scheduled to have the stent removed in the office tomorrow with Dr. Louis Meckel. She is encouraged to keep that appointment   Final Clinical Impressions(s) / ED Diagnoses   Final diagnoses:  None   1. [facility] and fatiue  ED Discharge Orders    None       Charlann Lange, [place] 03/06/18 0149    Tegeler,  Gwenyth Allegra, MD 03/13/18 [phone]

## 2018-03-04 NOTE — ED Notes (Signed)
Patient transported to X-ray 

## 2018-03-04 NOTE — ED Notes (Signed)
Pt aware that urine sample is needed.  

## 2018-03-05 ENCOUNTER — Emergency Department (HOSPITAL_COMMUNITY): Payer: Medicare Other

## 2018-03-05 DIAGNOSIS — C651 Malignant neoplasm of right renal pelvis: Secondary | ICD-10-CM | POA: Diagnosis not present

## 2018-03-05 DIAGNOSIS — C652 Malignant neoplasm of left renal pelvis: Secondary | ICD-10-CM | POA: Diagnosis not present

## 2018-03-05 DIAGNOSIS — Z466 Encounter for fitting and adjustment of urinary device: Secondary | ICD-10-CM | POA: Diagnosis not present

## 2018-03-05 LAB — URINALYSIS, ROUTINE W REFLEX MICROSCOPIC
BILIRUBIN URINE: NEGATIVE
Glucose, UA: NEGATIVE mg/dL
Ketones, ur: NEGATIVE mg/dL
Nitrite: NEGATIVE
Protein, ur: 30 mg/dL — AB
SPECIFIC GRAVITY, URINE: 1.01 (ref 1.005–1.030)
pH: 7 (ref 5.0–8.0)

## 2018-03-05 NOTE — ED Notes (Signed)
Pt back from x-ray.

## 2018-03-05 NOTE — Discharge Instructions (Addendum)
Keep your scheduled appointment tomorrow with Dr. Louis Meckel. He will be able to see all the lab and x-ray studies done tonight. Return here with any high fever, severe pain or new concern.

## 2018-03-06 LAB — URINE CULTURE

## 2018-03-08 NOTE — Anesthesia Postprocedure Evaluation (Signed)
Anesthesia Post Note  Patient: [name] Case  Procedure(s) Performed: CYSTOSCOPY WITH RETROGRADE PYELOGRAM/URETERAL STENT PLACEMENT (Right Ureter)     Patient location during evaluation: PACU Anesthesia Type: General Level of consciousness: awake and sedated Pain management: pain level controlled Vital Signs Assessment: post-procedure vital signs reviewed and stable Respiratory status: spontaneous breathing Cardiovascular status: stable Postop Assessment: no apparent nausea or vomiting Anesthetic complications: no    Last Vitals:  Vitals:   03/01/18 1253 03/01/18 1435  BP: 120/61   Pulse: (!) 52   Resp: 20   Temp: 37.1 C   SpO2: 90% 96%    Last Pain:  Vitals:   03/01/18 1253  TempSrc: Oral  PainSc:    Pain Goal: Patients Stated Pain Goal: 0 (02/28/18 2012)               Trea Carnegie JR,JOHN Mateo Flow

## 2018-03-15 ENCOUNTER — Ambulatory Visit (INDEPENDENT_AMBULATORY_CARE_PROVIDER_SITE_OTHER): Payer: Medicare Other | Admitting: Internal Medicine

## 2018-03-15 ENCOUNTER — Encounter: Payer: Self-pay | Admitting: Internal Medicine

## 2018-03-15 ENCOUNTER — Other Ambulatory Visit (INDEPENDENT_AMBULATORY_CARE_PROVIDER_SITE_OTHER): Payer: Medicare Other

## 2018-03-15 VITALS — BP 126/70 | HR 65 | Temp 97.8°F | Ht 60.0 in | Wt 221.0 lb

## 2018-03-15 DIAGNOSIS — N390 Urinary tract infection, site not specified: Secondary | ICD-10-CM

## 2018-03-15 DIAGNOSIS — E119 Type 2 diabetes mellitus without complications: Secondary | ICD-10-CM

## 2018-03-15 DIAGNOSIS — E039 Hypothyroidism, unspecified: Secondary | ICD-10-CM

## 2018-03-15 DIAGNOSIS — R319 Hematuria, unspecified: Secondary | ICD-10-CM

## 2018-03-15 DIAGNOSIS — I4891 Unspecified atrial fibrillation: Secondary | ICD-10-CM

## 2018-03-15 LAB — FERRITIN: FERRITIN: 326.8 ng/mL — AB (ref 10.0–291.0)

## 2018-03-15 LAB — CBC
HCT: 35.1 % — ABNORMAL LOW (ref 36.0–46.0)
Hemoglobin: 11.8 g/dL — ABNORMAL LOW (ref 12.0–15.0)
MCHC: 33.6 g/dL (ref 30.0–36.0)
MCV: 94 fl (ref 78.0–100.0)
Platelets: 298 10*3/uL (ref 150.0–400.0)
RBC: 3.73 Mil/uL — AB (ref 3.87–5.11)
RDW: 15.5 % (ref 11.5–15.5)
WBC: 6.4 10*3/uL (ref 4.0–10.5)

## 2018-03-15 LAB — TSH: TSH: 0.97 u[IU]/mL (ref 0.35–4.50)

## 2018-03-15 MED ORDER — LEVOTHYROXINE SODIUM 100 MCG PO TABS: 100.0000 ug | ORAL_TABLET | Freq: Every day | ORAL | 3 refills | Status: DC

## 2018-03-15 NOTE — Assessment & Plan Note (Signed)
Foot exam done today. Recent HgA1c at goal off meds.

## 2018-03-15 NOTE — Progress Notes (Signed)
   Subjective:    Patient ID: [name] Case, female    DOB: [birth year], 84 y.o.   MRN: [id number]  HPI The patient is an 84 YO female coming in for hospital follow up (in several times for bladder cancer and resection and then stenting of the ureters and then removal of stents due to stones). She is doing well currently. She is taking iron but is constipated from that. She is trying to eat iron rich foods. Hg was increasing nicely at ER visit 7/4. She denies bleeding in urine or otherwise. She denies headaches or chest pains or SOB. She denies diarrhea, nausea or vomiting. Denies leg swelling. Taking her eliquis.   PMH, [facility], social history reviewed and updated.   Review of Systems  Constitutional: Negative.   HENT: Negative.   Eyes: Negative.   Respiratory: Negative for cough, chest tightness and shortness of breath.   Cardiovascular: Negative for chest pain, palpitations and leg swelling.  Gastrointestinal: Negative for abdominal distention, abdominal pain, constipation, diarrhea, nausea and vomiting.  Musculoskeletal: Negative.   Skin: Negative.   Neurological: Negative.   Psychiatric/Behavioral: Negative.       Objective:   Physical Exam  Constitutional: She is oriented to person, place, and time. She appears well-developed and well-nourished.  overweight  HENT:  Head: Normocephalic and atraumatic.  Eyes: EOM are normal.  Neck: Normal range of motion.  Cardiovascular: Normal rate.  Pulmonary/Chest: Effort normal and breath sounds normal. No respiratory distress. She has no wheezes. She has no rales.  Abdominal: Soft. Bowel sounds are normal. She exhibits no distension. There is no tenderness. There is no rebound.  Musculoskeletal: She exhibits no edema.  Neurological: She is alert and oriented to person, place, and time. Coordination normal.  Skin: Skin is warm and dry.  Psychiatric: She has a normal mood and affect.   Vitals:   03/15/18 1348  BP: 126/70  Pulse: 65    Temp: 97.8 F (36.6 C)  TempSrc: Oral  SpO2: 97%  Weight: 221 lb (100.2 kg)  Height: 5' (1.524 m)      Assessment & Plan:

## 2018-03-15 NOTE — Assessment & Plan Note (Signed)
Rate controlled, on eliquis and diltiazem and toprol. Checking CBC and ferritin today after multiple procedures urologic.

## 2018-03-15 NOTE — Assessment & Plan Note (Signed)
No signs of infection or blood in urine currently.

## 2018-03-15 NOTE — Assessment & Plan Note (Signed)
BMI >21 and complicated by hyperlipidemia, dm, htn.

## 2018-03-15 NOTE — Assessment & Plan Note (Signed)
Checking TSH and free T4 and adjust synthroid 100 mcg daily if needed.

## 2018-03-15 NOTE — Patient Instructions (Signed)
Iron-Rich Diet Iron is a mineral that helps your body to produce hemoglobin. Hemoglobin is a protein in your red blood cells that carries oxygen to your body's tissues. Eating too little iron may cause you to feel weak and tired, and it can increase your risk for infection. Eating enough iron is necessary for your body's metabolism, muscle function, and nervous system. Iron is naturally found in many foods. It can also be added to foods or fortified in foods. There are two types of dietary iron:  Heme iron. Heme iron is absorbed by the body more easily than nonheme iron. Heme iron is found in meat, poultry, and fish.  Nonheme iron. Nonheme iron is found in dietary supplements, iron-fortified grains, beans, and vegetables.  You may need to follow an iron-rich diet if:  You have been diagnosed with iron deficiency or iron-deficiency anemia.  You have a condition that prevents you from absorbing dietary iron, such as: ? Infection in your intestines. ? Celiac disease. This involves long-lasting (chronic) inflammation of your intestines.  You do not eat enough iron.  You eat a diet that is high in foods that impair iron absorption.  You have lost a lot of blood.  You have heavy bleeding during your menstrual cycle.  You are pregnant.  What is my plan? Your health care provider may help you to determine how much iron you need per day based on your condition. Generally, when a person consumes sufficient amounts of iron in the diet, the following iron needs are met:  Men. ? 14-18 years old: 11 mg per day. ? 19-50 years old: 8 mg per day.  Women. ? 14-18 years old: 15 mg per day. ? 19-50 years old: 18 mg per day. ? Over 50 years old: 8 mg per day. ? Pregnant women: 27 mg per day. ? Breastfeeding women: 9 mg per day.  What do I need to know about an iron-rich diet?  Eat fresh fruits and vegetables that are high in vitamin C along with foods that are high in iron. This will help  increase the amount of iron that your body absorbs from food, especially with foods containing nonheme iron. Foods that are high in vitamin C include oranges, peppers, tomatoes, and mango.  Take iron supplements only as directed by your health care provider. Overdose of iron can be life-threatening. If you were prescribed iron supplements, take them with orange juice or a vitamin C supplement.  Cook foods in pots and pans that are made from iron.  Eat nonheme iron-containing foods alongside foods that are high in heme iron. This helps to improve your iron absorption.  Certain foods and drinks contain compounds that impair iron absorption. Avoid eating these foods in the same meal as iron-rich foods or with iron supplements. These include: ? Coffee, black tea, and red wine. ? Milk, dairy products, and foods that are high in calcium. ? Beans, soybeans, and peas. ? Whole grains.  When eating foods that contain both nonheme iron and compounds that impair iron absorption, follow these tips to absorb iron better. ? Soak beans overnight before cooking. ? Soak whole grains overnight and drain them before using. ? Ferment flours before baking, such as using yeast in bread dough. What foods can I eat? Grains Iron-fortified breakfast cereal. Iron-fortified whole-wheat bread. Enriched rice. Sprouted grains. Vegetables Spinach. Potatoes with skin. Green peas. Broccoli. Red and green bell peppers. Fermented vegetables. Fruits Prunes. Raisins. Oranges. Strawberries. Mango. Grapefruit. Meats and Other Protein Sources   Beef liver. Oysters. Beef. Shrimp. [place]. Chicken. [place]. Sardines. Chickpeas. Nuts. Tofu. Beverages Tomato juice. Fresh orange juice. Prune juice. Hibiscus tea. Fortified instant breakfast shakes. Condiments Tahini. Fermented soy sauce. Sweets and Desserts Black-strap molasses. Other Wheat germ. The items listed above may not be a complete list of recommended foods or beverages.  Contact your dietitian for more options. What foods are not recommended? Grains Whole grains. Bran cereal. Bran flour. Oats. Vegetables Artichokes. Brussels sprouts. Kale. Fruits Blueberries. Raspberries. Strawberries. Figs. Meats and Other Protein Sources Soybeans. Products made from soy protein. Dairy Milk. Cream. Cheese. Yogurt. Cottage cheese. Beverages Coffee. Black tea. Red wine. Sweets and Desserts Cocoa. Chocolate. Ice cream. Other Basil. Oregano. Parsley. The items listed above may not be a complete list of foods and beverages to avoid. Contact your dietitian for more information. This information is not intended to replace advice given to you by your health care provider. Make sure you discuss any questions you have with your health care provider. Document Released: 04/01/2005 Document Revised: 03/07/2016 Document Reviewed: 03/15/2014 Elsevier Interactive Patient Education  [employer].

## 2018-03-24 ENCOUNTER — Other Ambulatory Visit: Payer: Self-pay | Admitting: Urology

## 2018-03-30 DIAGNOSIS — M1711 Unilateral primary osteoarthritis, right knee: Secondary | ICD-10-CM | POA: Diagnosis not present

## 2018-04-12 ENCOUNTER — Inpatient Hospital Stay: Payer: Medicare Other | Attending: Genetic Counselor | Admitting: Genetic Counselor

## 2018-04-12 ENCOUNTER — Encounter: Payer: Self-pay | Admitting: Genetic Counselor

## 2018-04-12 ENCOUNTER — Inpatient Hospital Stay: Payer: Medicare Other

## 2018-04-12 ENCOUNTER — Encounter: Payer: Self-pay | Admitting: Gastroenterology

## 2018-04-12 DIAGNOSIS — Z8051 Family history of malignant neoplasm of kidney: Secondary | ICD-10-CM

## 2018-04-12 DIAGNOSIS — C689 Malignant neoplasm of urinary organ, unspecified: Secondary | ICD-10-CM | POA: Diagnosis not present

## 2018-04-12 DIAGNOSIS — Z8052 Family history of malignant neoplasm of bladder: Secondary | ICD-10-CM

## 2018-04-12 DIAGNOSIS — Z800 Family history of malignant neoplasm of digestive organs: Secondary | ICD-10-CM | POA: Diagnosis not present

## 2018-04-12 NOTE — Progress Notes (Signed)
REFERRING PROVIDER: Ardis Hughs, MD [place], [place] [address]  PRIMARY PROVIDER:  Hoyt Koch, MD  PRIMARY REASON FOR VISIT:  1. Family history of bladder cancer   2. Family history of colon cancer   3. Family history of kidney cancer   4. Urothelial cancer ([place])      HISTORY OF PRESENT ILLNESS:   [name] Case, a 84 y.o. female, was seen for a [place] cancer genetics consultation at the request of Dr. Louis Meckel due to a personal and family history of cancer.  [name] Case presents to clinic today to discuss the possibility of a hereditary predisposition to cancer, genetic testing, and to further clarify her future cancer risks, as well as potential cancer risks for family members.   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 This was treated with scraping of the ureters twice a month.  She will not receive chemotherapy or radiation.    CANCER HISTORY:   No history exists.     HORMONAL RISK FACTORS:  Menarche was at age 57.  First live birth at age 70.  OCP use for approximately 5 years.  Ovaries intact: yes.  Hysterectomy: yes.  Menopausal status: postmenopausal.  HRT use: 0 years. Colonoscopy: yes; normal. Mammogram within the last year: yes. Number of breast biopsies: 1. Up to date with pelvic exams:  n/a. Any excessive radiation exposure in the past:  no  Past Medical History:  Diagnosis Date  . Allergy   . Arthritis   . Atrial fibrillation ([place])   . Bronchitis   . Cancer ([place])    Bilateral upper tract transitional cell carcinoma  . Cardiomegaly   . CHF (congestive heart failure) ([place])   . Complication of anesthesia    Difficult to arouse  . COPD (chronic obstructive pulmonary disease) ([facility])    borderline   . Diabetes mellitus without complication ([place])    Controlled by diet and exercise  . Diverticulosis of sigmoid colon   . Family history of bladder cancer   . Family history of  colon cancer   . Family history of kidney cancer   . Fatty liver   . GERD (gastroesophageal reflux disease)   . History of hiatal hernia 01/20/2015   Small sliding, noted on CT  . History of kidney stones   . Hyperlipidemia   . Hypertension   . Hypothyroidism   . PONV (postoperative nausea and vomiting)   . Supraumbilical hernia 56/25/6389   Small noted on CT  . Thyroid disease   . UTI (urinary tract infection)     Past Surgical History:  Procedure Laterality Date  . ABDOMINAL HYSTERECTOMY    . APPENDECTOMY    . CHOLECYSTECTOMY    . COLONOSCOPY  07/16/2009  . CYSTOSCOPY W/ URETERAL STENT PLACEMENT Right 12/04/2017   Procedure: CYSTOSCOPY WITH RETROGRADE PYELOGRAM/URETERAL STENT PLACEMENT;  Surgeon: Festus Aloe, MD;  Location: [facility];  Service: Urology;  Laterality: Right;  . CYSTOSCOPY W/ URETERAL STENT PLACEMENT Right 02/26/2018   Procedure: CYSTOSCOPY WITH RETROGRADE PYELOGRAM/URETERAL STENT PLACEMENT;  Surgeon: Kathie Rhodes, MD;  Location: [facility];  Service: Urology;  Laterality: Right;  . CYSTOSCOPY WITH URETEROSCOPY AND STENT PLACEMENT Bilateral 01/29/2018   Procedure: BILATERAL URETEROSCOPY AND TUMOR RESECTION  WITH LASER RIGHT STENT EXCHANGE LEFT STENT PLACEMENT;  Surgeon: Ardis Hughs, MD;  Location: [facility];  Service: Urology;  Laterality: Bilateral;  . CYSTOSCOPY WITH URETEROSCOPY AND STENT PLACEMENT Bilateral 02/10/2018  Procedure: BILATERAL URETEROSCOPY AND TUMOR EXCISION BILATERAL STENT EXCHANGE;  Surgeon: Ardis Hughs, MD;  Location: [facility];  Service: Urology;  Laterality: Bilateral;  . CYSTOSCOPY/RETROGRADE/URETEROSCOPY Bilateral 12/24/2017   Procedure: BILATERAL URETEROSCOPY, RIGHT STENT EXCHANGE, STONE EXTRACTION WITH BASKET BILATERAL RETROGRADE PYELOGRAM, BIOPSY OF RIGHT UPPER POLE TUMOR,  BILATERAL RENAL PELVIS FLUID SENT FOR CYTOLOGY;  Surgeon: Ardis Hughs, MD;  Location: [facility];  Service: Urology;  Laterality: Bilateral;  . KNEE SURGERY  Right   . KNEE SURGERY Left    torn ligament repair  . UPPER GI ENDOSCOPY      Social History   Socioeconomic History  . Marital status: Widowed    Spouse name: Not on file  . Number of children: Not on file  . Years of education: Not on file  . Highest education level: Not on file  Occupational History  . Occupation: retired  Scientific laboratory technician  . Financial resource strain: Not on file  . Food insecurity:    Worry: Not on file    Inability: Not on file  . Transportation needs:    Medical: Not on file    Non-medical: Not on file  Tobacco Use  . Smoking status: Never Smoker  . Smokeless tobacco: Never Used  Substance and Sexual Activity  . Alcohol use: No    Alcohol/week: 0.0 standard drinks  . Drug use: No  . Sexual activity: Not Currently    Birth control/protection: Surgical  Lifestyle  . Physical activity:    Days per week: Not on file    Minutes per session: Not on file  . Stress: Not on file  Relationships  . Social connections:    Talks on phone: Not on file    Gets together: Not on file    Attends religious service: Not on file    Active member of club or organization: Not on file    Attends meetings of clubs or organizations: Not on file    Relationship status: Not on file  Other Topics Concern  . Not on file  Social History Narrative  . Not on file     FAMILY HISTORY:  We obtained a detailed, 4-generation family history.  Significant diagnoses are listed below: Family History  Problem Relation Age of Onset  . Heart disease Mother   . Colon cancer Mother 42       d. 82  . Tuberculosis Father   . Bladder Cancer Brother        urothelial cancer  . Bladder Cancer Brother        ureter cancer  . Kidney cancer Brother   . Skin cancer Sister   . Cancer Maternal Aunt        NOS  . Colon cancer Other        dx 1st time under 27, second time at 14  . Lung cancer Other   . Muscular dystrophy Other        d. 4  . Testicular cancer Other 24       great  nephew  . Alcohol abuse Daughter   . Drug abuse Daughter   . Schizophrenia Daughter     The patient had three children.  One child died in an accident at age 40, and a daughter died due to complications of drug and alcohol abuse and schizophrenia.  The patient has two sisters and four brothers.  One sister had skin cancer, two brothers had urothelial cancer and one also had kidney cancer.  Another  brother has a grandson with testicular cancer at 71 and a sister has a son who had colon cancer twice, once under 42 and one time at 40.  Both parents are deceased.  The mother had colon cancer at 31.    The patient's mother had three sisters and two brothers.  One sister may have had cancer.  There are many cousins and some cousins have had cancer.  The maternal grandparents are deceased.  The patient's father died of black lung and tuberculosis.  He had two sisters and a brother who are cancer free.  The paternal grandparents are deceased.  The grandfather had an unknown form of cancer.  Of note, the grandparents are reportedly double first cousins.  [name] Case is unaware of previous family history of genetic testing for hereditary cancer risks. Patient's maternal ancestors are of [place] and [place] descent, and paternal ancestors are of [place] descent. There is no reported Ashkenazi Jewish ancestry. There is no known consanguinity.  GENETIC COUNSELING ASSESSMENT: [name] Case is a 84 y.o. female with a personal history of urothelial cancer and family history of colon, urothelial, kidney cancer which is somewhat suggestive of a Lynch syndrome and predisposition to cancer. We, therefore, discussed and recommended the following at today's visit.   DISCUSSION: We discussed that urothelial cancer is an uncommon bladder cancer that can be associated with Lynch syndrome.  Urothelial cancer is more common in men than in women.  Lynch syndrome is primarily a colon cancer syndrome, but there are other  cancers including GYN, urinary tract and other GI cancers that are associated with the condition.  We reviewed the characteristics, features and inheritance patterns of hereditary cancer syndromes. We also discussed genetic testing, including the appropriate family members to test, the process of testing, insurance coverage and turn-around-time for results. We discussed the implications of a negative, positive and/or variant of uncertain significant result. We recommended [name] Case pursue genetic testing for the Multi-cancer gene panel. The Multi-Gene Panel offered by [facility] includes sequencing and/or deletion duplication testing of the following 84 genes: AIP, ALK, APC, ATM, AXIN2,BAP1,  BARD1, BLM, BMPR1A, BRCA1, BRCA2, BRIP1, CASR, CDC73, CDH1, CDK4, CDKN1B, CDKN1C, CDKN2A (p14ARF), CDKN2A (p16INK4a), CEBPA, CHEK2, CTNNA1, DICER1, DIS3L2, EGFR (c.2369C>T, p.Thr790Met variant only), EPCAM (Deletion/duplication testing only), FH, FLCN, GATA2, GPC3, GREM1 (Promoter region deletion/duplication testing only), HOXB13 (c.251G>A, p.Gly84Glu), HRAS, KIT, MAX, MEN1, MET, MITF (c.952G>A, p.Glu318Lys variant only), MLH1, MSH2, MSH3, MSH6, MUTYH, NBN, NF1, NF2, NTHL1, PALB2, PDGFRA, PHOX2B, PMS2, POLD1, POLE, POT1, PRKAR1A, PTCH1, PTEN, RAD50, RAD51C, RAD51D, RB1, RECQL4, RET, RUNX1, SDHAF2, SDHA (sequence changes only), SDHB, SDHC, SDHD, SMAD4, SMARCA4, SMARCB1, SMARCE1, STK11, SUFU, TERC, TERT, TMEM127, TP53, TSC1, TSC2, VHL, WRN and WT1.    Medicare specifically requires that an individual have a Lynch syndrome cancer and have 3 or more first or second degree relatives with a Lynch syndrome cancer. Based on [name] Case's personal and family history of cancer, she meets medical criteria for genetic testing. Despite that she meets criteria, she may still have an out of pocket cost. We discussed that if her out of pocket cost for testing is over $100, the laboratory will call and confirm whether she wants to proceed  with testing.  If the out of pocket cost of testing is less than $100 she will be billed by the genetic testing laboratory.   In order to estimate her chance of having a MMR mutation, we used statistical models (PREMM1,2,6) and laboratory data that take into account her  personal medical history, family history and ancestry.  Because each model is different, there can be a lot of variability in the risks they give.  Therefore, these numbers must be considered a rough range and not a precise risk of having a MMR mutation.  These models estimate that she has approximately a 1.2% chance of having a mutation.    PLAN: After considering the risks, benefits, and limitations, [name] Case  provided informed consent to pursue genetic testing and the blood sample was sent to [facility] for analysis of the multi-cancer panel. Results should be available within approximately 2-3 weeks' time, at which point they will be disclosed by telephone to [name] Case, as will any additional recommendations warranted by these results. [name] Case will receive a summary of her genetic counseling visit and a copy of her results once available. This information will also be available in Epic. We encouraged [name] Case to remain in contact with cancer genetics annually so that we can continuously update the family history and inform her of any changes in cancer genetics and testing that may be of benefit for her family. [name] Case questions were answered to her satisfaction today. Our contact information was provided should additional questions or concerns arise.  Based on [name] Case's family history, we recommended her nephew, who was diagnosed with colon cancer at under the age of 29, have genetic counseling and testing. [name] Case will let us know if we can be of any assistance in coordinating genetic counseling and/or testing for this family member.   Lastly, we encouraged [name] Case to remain in contact  with cancer genetics annually so that we can continuously update the family history and inform her of any changes in cancer genetics and testing that may be of benefit for this family.   Ms.  Case questions were answered to her satisfaction today. Our contact information was provided should additional questions or concerns arise. Thank you for the referral and allowing [place] to share in the care of your patient.   Karen P. Florene Glen, [place], [facility] Certified Genetic Counselor [email or web address] phone: [phone]  The patient was seen for a total of 37 minutes in face-to-face genetic counseling.  This patient was discussed with Drs. Magrinat, Lindi Adie and/or Burr Medico who agrees with the above.    _______________________________________________________________________ For Office Staff:  Number of people involved in session: 2 Was an Intern/ student involved with case: no

## 2018-04-23 ENCOUNTER — Other Ambulatory Visit: Payer: Self-pay | Admitting: Internal Medicine

## 2018-04-23 ENCOUNTER — Ambulatory Visit (INDEPENDENT_AMBULATORY_CARE_PROVIDER_SITE_OTHER): Payer: Medicare Other | Admitting: Internal Medicine

## 2018-04-23 ENCOUNTER — Encounter: Payer: Self-pay | Admitting: Internal Medicine

## 2018-04-23 DIAGNOSIS — F4320 Adjustment disorder, unspecified: Secondary | ICD-10-CM | POA: Insufficient documentation

## 2018-04-23 DIAGNOSIS — F4323 Adjustment disorder with mixed anxiety and depressed mood: Secondary | ICD-10-CM

## 2018-04-23 MED ORDER — MIRTAZAPINE 15 MG PO TABS: 15.0000 mg | ORAL_TABLET | Freq: Every day | ORAL | 3 refills | Status: DC

## 2018-04-23 NOTE — Patient Instructions (Signed)
We have sent in remeron which can help with sleep and irritability.   Take it about 30 minutes prior to going to sleep.   Think about doing some therapy with your family to help with the situation and get everyone on the same page so that you are not under as much stress.    Stress and Stress Management Stress is a normal reaction to life events. It is what you feel when life demands more than you are used to or more than you can handle. Some stress can be useful. For example, the stress reaction can help you catch the last bus of the day, study for a test, or meet a deadline at work. But stress that occurs too often or for too long can cause problems. It can affect your emotional health and interfere with relationships and normal daily activities. Too much stress can weaken your immune system and increase your risk for physical illness. If you already have a medical problem, stress can make it worse. What are the causes? All sorts of life events may cause stress. An event that causes stress for one person may not be stressful for another person. Major life events commonly cause stress. These may be positive or negative. Examples include losing your job, moving into a new home, getting married, having a baby, or losing a loved one. Less obvious life events may also cause stress, especially if they occur day after day or in combination. Examples include working long hours, driving in traffic, caring for children, being in debt, or being in a difficult relationship. What are the signs or symptoms? Stress may cause emotional symptoms including, the following:  Anxiety. This is feeling worried, afraid, on edge, overwhelmed, or out of control.  Anger. This is feeling irritated or impatient.  Depression. This is feeling sad, down, helpless, or guilty.  Difficulty focusing, remembering, or making decisions.  Stress may cause physical symptoms, including the following:  Aches and pains. These may  affect your head, neck, back, stomach, or other areas of your body.  Tight muscles or clenched jaw.  Low energy or trouble sleeping.  Stress may cause unhealthy behaviors, including the following:  Eating to feel better (overeating) or skipping meals.  Sleeping too little, too much, or both.  Working too much or putting off tasks (procrastination).  Smoking, drinking alcohol, or using drugs to feel better.  How is this diagnosed? Stress is diagnosed through an assessment by your health care provider. Your health care provider will ask questions about your symptoms and any stressful life events.Your health care provider will also ask about your medical history and may order blood tests or other tests. Certain medical conditions and medicine can cause physical symptoms similar to stress. Mental illness can cause emotional symptoms and unhealthy behaviors similar to stress. Your health care provider may refer you to a mental health professional for further evaluation. How is this treated? Stress management is the recommended treatment for stress.The goals of stress management are reducing stressful life events and coping with stress in healthy ways. Techniques for reducing stressful life events include the following:  Stress identification. Self-monitor for stress and identify what causes stress for you. These skills may help you to avoid some stressful events.  Time management. Set your priorities, keep a calendar of events, and learn to say "no." These tools can help you avoid making too many commitments.  Techniques for coping with stress include the following:  Rethinking the problem. Try to think realistically  about stressful events rather than ignoring them or overreacting. Try to find the positives in a stressful situation rather than focusing on the negatives.  Exercise. Physical exercise can release both physical and emotional tension. The key is to find a form of exercise you  enjoy and do it regularly.  Relaxation techniques. These relax the body and mind. Examples include yoga, meditation, tai chi, biofeedback, deep breathing, progressive muscle relaxation, listening to music, being out in nature, journaling, and other hobbies. Again, the key is to find one or more that you enjoy and can do regularly.  Healthy lifestyle. Eat a balanced diet, get plenty of sleep, and do not smoke. Avoid using alcohol or drugs to relax.  Strong support network. Spend time with family, friends, or other people you enjoy being around.Express your feelings and talk things over with someone you trust.  Counseling or talktherapy with a mental health professional may be helpful if you are having difficulty managing stress on your own. Medicine is typically not recommended for the treatment of stress.Talk to your health care provider if you think you need medicine for symptoms of stress. Follow these instructions at home:  Keep all follow-up visits as directed by your health care provider.  Take all medicines as directed by your health care provider. Contact a health care provider if:  Your symptoms get worse or you start having new symptoms.  You feel overwhelmed by your problems and can no longer manage them on your own. Get help right away if:  You feel like hurting yourself or someone else. This information is not intended to replace advice given to you by your health care provider. Make sure you discuss any questions you have with your health care provider. Document Released: 02/11/2001 Document Revised: 01/24/2016 Document Reviewed: 04/12/2013 Elsevier Interactive Patient Education  2017 [employer].

## 2018-04-23 NOTE — Assessment & Plan Note (Signed)
Suspect some anxiety and sleep changes due to situational. Rx for remeron as her PHQ-9 and GAD assessment were both highly positive. This will help with sleep and anxiety. She is encouraged to seek counseling and even family counseling to help with dynamics if she intends to stay in this housing situation or find an alternative housing situation.

## 2018-04-23 NOTE — Progress Notes (Signed)
   Subjective:    Patient ID: [name] Case, female    DOB: 1932-08-13, 84 y.o.   MRN: [id number]  HPI The patient is an 84 YO female coming in for concerns about stress, anxiety and sleeping problems. Her problems started when her granddaughter encouraged her to sell her home and move in with her. She did this as her husband had passed and she did not want to stay alone. She is rooming with her great grand son and there are about 4-5 dogs in the house. She denies sleeping well and tries to stay in her room and ignore everyone. Her granddaughter does not talk to her instead stays in her room with neck pain. She wants money from the patient. She has not tried anything. People are telling her at home that she is yelling and talking loudly at people although she does not think that. She does have some mild hearing loss left ear. She likes readings westerns and still does that and it causes her pleasure. She would like something for sleep. She is also facing surgery in October for possible cancer bladder. Denies SI/HI.  Review of Systems  Constitutional: Negative.   Respiratory: Negative for cough, chest tightness and shortness of breath.   Cardiovascular: Negative for chest pain, palpitations and leg swelling.  Gastrointestinal: Negative for abdominal distention, abdominal pain, constipation, diarrhea, nausea and vomiting.  Musculoskeletal: Negative.   Skin: Negative.   Neurological: Negative.   Psychiatric/Behavioral: Positive for dysphoric mood and sleep disturbance. Negative for self-injury and suicidal ideas. The patient is nervous/anxious.       Objective:   Physical Exam  Constitutional: She is oriented to person, place, and time. She appears well-developed and well-nourished.  Some unkempt  HENT:  Head: Normocephalic and atraumatic.  Eyes: EOM are normal.  Neck: Normal range of motion.  Cardiovascular: Normal rate and regular rhythm.  Pulmonary/Chest: Effort normal and breath sounds  normal. No respiratory distress. She has no wheezes. She has no rales.  Abdominal: Soft. Bowel sounds are normal. She exhibits no distension. There is no tenderness. There is no rebound.  Musculoskeletal: She exhibits no edema.  Neurological: She is alert and oriented to person, place, and time. Coordination normal.  Skin: Skin is warm and dry.  Psychiatric:  Some flat affect during visit.    Vitals:   04/23/18 1401  BP: 126/72  Pulse: 61  Temp: 97.9 F (36.6 C)  TempSrc: Oral  SpO2: 99%  Weight: 216 lb (98 kg)  Height: 5' (1.524 m)      Assessment & Plan:

## 2018-04-28 ENCOUNTER — Encounter: Payer: Self-pay | Admitting: Genetic Counselor

## 2018-04-28 ENCOUNTER — Telehealth: Payer: Self-pay | Admitting: Genetic Counselor

## 2018-04-28 DIAGNOSIS — Z1379 Encounter for other screening for genetic and chromosomal anomalies: Secondary | ICD-10-CM | POA: Insufficient documentation

## 2018-04-28 NOTE — Telephone Encounter (Signed)
Revealed genetic test results and confirmed that the patient has Lynch syndrome.  She will come in to undergo a discussion next Wed., 9/4.

## 2018-05-05 ENCOUNTER — Telehealth: Payer: Self-pay

## 2018-05-05 ENCOUNTER — Inpatient Hospital Stay: Payer: Medicare Other | Attending: Genetic Counselor | Admitting: Genetic Counselor

## 2018-05-05 DIAGNOSIS — Z1509 Genetic susceptibility to other malignant neoplasm: Secondary | ICD-10-CM | POA: Insufficient documentation

## 2018-05-05 DIAGNOSIS — Z1379 Encounter for other screening for genetic and chromosomal anomalies: Secondary | ICD-10-CM

## 2018-05-05 DIAGNOSIS — C689 Malignant neoplasm of urinary organ, unspecified: Secondary | ICD-10-CM

## 2018-05-05 NOTE — Progress Notes (Signed)
REFERRING PROVIDER: Ardis Hughs, MD [place], [place] [address]  PRIMARY PROVIDER:  Hoyt Koch, MD  PRIMARY REASON FOR VISIT:  1. Urothelial cancer ([place])   2. Genetic testing   3. Lynch syndrome     GENETIC TEST RESULTS   Patient Name: [name] Case Patient Age: 84 y.o. Encounter Date: 05/05/2018  HPI: [name] Case was previously seen in the [facility] clinic due to a family of cancer and concerns regarding a hereditary predisposition to cancer. Please refer to our prior cancer genetics clinic note for more information regarding [name] Case's medical, social and family histories, and our assessment and recommendations, at the time. [name] Case recent genetic test results were disclosed to her, as were recommendations warranted by these results. These results and recommendations are discussed in more detail below.   FAMILY HISTORY:  We obtained a detailed, 4-generation family history.  Significant diagnoses are listed below: Family History  Problem Relation Age of Onset  . Heart disease Mother   . Colon cancer Mother 101       d. 19  . Tuberculosis Father   . Bladder Cancer Brother        urothelial cancer  . Bladder Cancer Brother        ureter cancer  . Kidney cancer Brother   . Skin cancer Sister   . Cancer Maternal Aunt        NOS  . Colon cancer Other        dx 1st time under 39, second time at 53  . Lung cancer Other   . Muscular dystrophy Other        d. 27  . Testicular cancer Other 24       great nephew  . Alcohol abuse Daughter   . Drug abuse Daughter   . Schizophrenia Daughter     The patient had three children.  One child died in an accident at age 40, and a daughter died due to complications of drug and alcohol abuse and schizophrenia.  The patient has two sisters and four brothers.  One sister had skin cancer, two brothers had urothelial cancer and one also had kidney cancer.  Another brother has a  grandson with testicular cancer at 73 and a sister has a son who had colon cancer twice, once under 88 and one time at 41.  Both parents are deceased.  The mother had colon cancer at 57.    The patient's mother had three sisters and two brothers.  One sister may have had cancer.  There are many cousins and some cousins have had cancer.  The maternal grandparents are deceased.  The patient's father died of black lung and tuberculosis.  He had two sisters and a brother who are cancer free.  The paternal grandparents are deceased.  The grandfather had an unknown form of cancer.  Of note, the grandparents are reportedly double first cousins.  [name] Case is unaware of previous family history of genetic testing for hereditary cancer risks. Patient's maternal ancestors are of [place] and [place] descent, and paternal ancestors are of [place] descent. There is no reported Ashkenazi Jewish ancestry. There is no known consanguinity.  GENETIC TESTING:  At the time of [name] Case's visit, we recommended she pursue genetic testing of the Multi-cancer gene test. The genetic testing April 28, 2018 through the Multi- Cancer Panel offered by [facility] identified a single, heterozygous pathogenic gene mutation called MSH6, c.3261dup (p.Phe1088Leufs*5) confirming the diagnosis  of Lynch syndrome.There were no deleterious mutations in AIP, ALK, APC, ATM, AXIN2,BAP1,  BARD1, BLM, BMPR1A, BRCA1, BRCA2, BRIP1, CASR, CDC73, CDH1, CDK4, CDKN1B, CDKN1C, CDKN2A (p14ARF), CDKN2A (p16INK4a), CEBPA, CHEK2, CTNNA1, DICER1, DIS3L2, EGFR (c.2369C>T, p.Thr790Met variant only), EPCAM (Deletion/duplication testing only), FH, FLCN, GATA2, GPC3, GREM1 (Promoter region deletion/duplication testing only), HOXB13 (c.251G>A, p.Gly84Glu), HRAS, KIT, MAX, MEN1, MET, MITF (c.952G>A, p.Glu318Lys variant only), MLH1, MSH2, MSH3, MUTYH, NBN, NF1, NF2, NTHL1, PALB2, PDGFRA, PHOX2B, PMS2, POLD1, POLE, POT1, PRKAR1A, PTCH1, PTEN, RAD50, RAD51C,  RAD51D, RB1, RECQL4, RET, RUNX1, SDHAF2, SDHA (sequence changes only), SDHB, SDHC, SDHD, SMAD4, SMARCA4, SMARCB1, SMARCE1, STK11, SUFU, TERC, TERT, TMEM127, TP53, TSC1, TSC2, VHL, WRN and WT1.    Genetic testing did detect a Variant of Unknown Significance in the MSH3 gene called c.1896A>G (Silent).  At this time, it is unknown if this variant is associated with increased cancer risk or if this is a normal finding, but most variants such as this get reclassified to being inconsequential. It should not be used to make medical management decisions. With time, we suspect the lab will determine the significance of this variant, if any. If we do learn more about it, we will try to contact [name] Case to discuss it further. However, it is important to stay in touch with [place] periodically and keep the address and phone number up to date.     A copy of the test report has been scanned into Epic for review.   SCREENING RECOMMENDATIONS: We discussed the implications of Lynch syndrome for [name] Case, and discussed who else in the family should have genetic testing. We recommended [name] Case follow management guidelines for Lynch syndrome; all of which are outlined below. These can be coordinated by [name] Case's GI doctor or her primary provider.   1. Annual colonoscopy.   2. While there is no clear evidence to support screening for stomach and small bowel cancer, an upper endoscopy can be considered at 3-5 year intervals beginning at age 38-35. However, whether to have this screening is best determined by the gastroenterologist.   3. Annual urinalysis.   4. For patients with colorectal adenocarcinoma, segmental or extended colectomy may be considered.  For women with Lynch syndrome, unlike the effective surveillance plan for colorectal cancer risk, there is no professional agreement regarding management for the increased risk of uterine and ovarian cancer. However, we are available to help women and  their providers establish an individualized surveillance plan. It is also important for women to understand the following:   1. Women should seek medical attention if they experience abnormal vaginal bleeding.   2. Some providers may still recommend vaginal ultrasounds, uterine biopsies (for uterine cancer risk) and/or CA-125 analysis ( for ovarian cancer risk), even though these have not been shown to be effective.  3. A hysterectomy with removal of the ovaries and fallopian tubes should be considered once childbearing is completed (if planned).  Ms. Rabelo has had a hysterectomy.  She is unsure if her ovaries were removed at the time of her hysterectomy.  FAMILY MEMBERS: Since we now know the mutation in Ms. Gurr, we can test at-risk relatives to determine whether or not they have inherited the mutation and are at increased risk for cancer.  It is important that all of Ms. Lio's relatives (both men and women) know of the presence of this gene mutation. Site-specific genetic testing can sort out who in the family is at risk and who is not. We will be happy to  meet with any of the family members or refer them to a genetic counselor in their local area. To locate genetic counselors in other cities, individuals can visit the website of the [employer] ([employer]) and Secretary/administrator for a Social worker by zip code.   Ms. Brummitt children and siblings have a 50% chance to have inherited this mutation. We recommend they have genetic testing for this same mutation, as identifying the presence of this mutation would allow them to also take advantage of risk-reducing measures. [facility] will cover genetic testing for the pathogenic variant and VUS for all family members for 90 days after the test report.  While there may be an office visit associated with the testing, the genetic test will be covered by [facility]'s testing policy.  Children who inherit two mutations in the same Lynch  gene, one mutation from each parent, are at-risk of a rare recessive condition called constitutional mismatch repair deficiency (CMMR-D) syndrome. If family members have this mutation, they may wish to have their partner tested if they are planning on having children.  PLAN: Ms. Novella will need to be followed as high risk based on her diagnosis of Lynch syndrome.    Ms. Hopf does not have a gastroenterologist to perform follow up for the diagnosis of Lynch syndrome.  We have asked that she be seen at [facility].  This note will be copied to that practice in order to set up the appropriate follow up.  Ms. Omahoney does not have a gynecologist to perform follow up for the diagnosis of Lynch syndrome.  We have asked that she go back to her referring physician to discuss hysterectomy and referral options.  This note will be copied to Ms. Vidana's primary care physician, who can help identify someone to discuss a possibly oophorectomy.  Ms. Jentz will be referred to the High Risk clinic to be followed for her Lynch syndrome.  We will send a referral to Dr. Truitt Merle regarding this.  RESOURCES:  Ms. Mckibbin was given patient information about Lynch syndrome that was written by the national support group "it takes guts". She was also provided resources for family members in [place], [place] and [place] so that they may find a Dietitian to undergo genetic testing.  We strongly encouraged Ms. Manger to remain in contact with [place] in cancer genetics on an annual basis so we can update Ms. Varon's personal and family histories, and inform her of advances in cancer genetics that may be of benefit for the entire family. Ms. Real knows she is also welcome to call with any questions or concerns, at any time.   Karen P. Florene Glen, [place], [facility]  Certified [employer]  [phone]

## 2018-05-05 NOTE — Telephone Encounter (Signed)
05/06/18 Dr Henrene Pastor 11 am the pt has been advised and she verbalized understanding.

## 2018-05-05 NOTE — Telephone Encounter (Signed)
-----   Message from Milus Banister, MD sent at 05/05/2018  2:29 PM EDT ----- Santiago Glad Thanks  We'll get her back in to the office to talk about all this.    Ripley Bogosian, She needs first available NGI visit with any provider for newly diagnosed Lynch Syndrome.  Thanks   ----- Message ----- From: Clarene Essex, Counselor Sent: 05/05/2018  12:26 PM EDT To: Milus Banister, MD, Gatha Mayer, MD, #  Patient was diagnosed with Lynch syndrome.  I am referring her back to [facility] - she used to see your group but has not had a colonoscopy in years.  I think she used to see Dr. Sharlett Iles?  Per the patient's daughter, your team will be seeing her grandson in the next few weeks for colonoscopy.  Dr. Burr Medico, I am referring her to see you in [facility].  Seth Bake, can you schedule this patient in the next week or so?  Santiago Glad

## 2018-05-05 NOTE — Progress Notes (Signed)
The pt has been scheduled for 05/06/18 at 11 am

## 2018-05-06 ENCOUNTER — Ambulatory Visit: Payer: Medicare Other | Admitting: Internal Medicine

## 2018-05-17 ENCOUNTER — Encounter: Payer: Self-pay | Admitting: Genetic Counselor

## 2018-05-25 NOTE — Progress Notes (Addendum)
ekg 02-26-18 epic   Clearance  Dr. Sharlet Salina  01-12-18 epic  cxr 03-04-18 epic  lov note   Cardiology 08-20-17 epic

## 2018-05-25 NOTE — Patient Instructions (Signed)
MILCA SYTSMA  05/25/2018   Your procedure is scheduled on: 06-03-18  Report to [facility] Main  Entrance  Report to admitting at    0530  AM    Call this number if you have problems the morning of surgery [phone]    Remember: Do not eat food or drink liquids :After Midnight. BRUSH YOUR TEETH MORNING OF SURGERY AND RINSE YOUR MOUTH OUT, NO CHEWING GUM CANDY OR MINTS.     Take these medicines the morning of surgery with A SIP OF WATER: metoprolol. Levothyroxine, diltiazem, inhaler and bring                                You may not have any metal on your body including hair pins and              piercings  Do not wear jewelry, make-up, lotions, powders or perfumes, deodorant             Do not wear nail polish.  Do not shave  48 hours prior to surgery.           Do not bring valuables to the hospital. [place].  Contacts, dentures or bridgework may not be worn into surgery.      Patients discharged the day of surgery will not be allowed to drive home.  Name and phone number of your driver:  Special Instructions: N/A              Please read over the following fact sheets you were given: _____________________________________________________________________             [facility] - Preparing for Surgery Before surgery, you can play an important role.  Because skin is not sterile, your skin needs to be as free of germs as possible.  You can reduce the number of germs on your skin by washing with CHG (chlorahexidine gluconate) soap before surgery.  CHG is an antiseptic cleaner which kills germs and bonds with the skin to continue killing germs even after washing. Please DO NOT use if you have an allergy to CHG or antibacterial soaps.  If your skin becomes reddened/irritated stop using the CHG and inform your nurse when you arrive at Short Stay. Do not shave (including legs and underarms) for at least  48 hours prior to the first CHG shower.  You may shave your face/neck. Please follow these instructions carefully:  1.  Shower with CHG Soap the night before surgery and the  morning of Surgery.  2.  If you choose to wash your hair, wash your hair first as usual with your  normal  shampoo.  3.  After you shampoo, rinse your hair and body thoroughly to remove the  shampoo.                           4.  Use CHG as you would any other liquid soap.  You can apply chg directly  to the skin and wash                       Gently with a scrungie or clean washcloth.  5.  Apply the  CHG Soap to your body ONLY FROM THE NECK DOWN.   Do not use on face/ open                           Wound or open sores. Avoid contact with eyes, ears mouth and genitals (private parts).                       Wash face,  Genitals (private parts) with your normal soap.             6.  Wash thoroughly, paying special attention to the area where your surgery  will be performed.  7.  Thoroughly rinse your body with warm water from the neck down.  8.  DO NOT shower/wash with your normal soap after using and rinsing off  the CHG Soap.                9.  Pat yourself dry with a clean towel.            10.  Wear clean pajamas.            11.  Place clean sheets on your bed the night of your first shower and do not  sleep with pets. Day of Surgery : Do not apply any lotions/deodorants the morning of surgery.  Please wear clean clothes to the hospital/surgery center.  FAILURE TO FOLLOW THESE INSTRUCTIONS MAY RESULT IN THE CANCELLATION OF YOUR SURGERY PATIENT SIGNATURE_________________________________  NURSE SIGNATURE__________________________________  ________________________________________________________________________

## 2018-05-26 ENCOUNTER — Encounter (HOSPITAL_COMMUNITY): Payer: Self-pay

## 2018-05-26 ENCOUNTER — Other Ambulatory Visit: Payer: Self-pay

## 2018-05-26 ENCOUNTER — Encounter (HOSPITAL_COMMUNITY)
Admission: RE | Admit: 2018-05-26 | Discharge: 2018-05-26 | Disposition: A | Discharge status: Home / Self Care | Payer: Medicare Other | Source: Ambulatory Visit | Attending: Urology | Admitting: Urology

## 2018-05-26 DIAGNOSIS — R8279 Other abnormal findings on microbiological examination of urine: Secondary | ICD-10-CM | POA: Diagnosis not present

## 2018-05-26 DIAGNOSIS — C679 Malignant neoplasm of bladder, unspecified: Secondary | ICD-10-CM | POA: Diagnosis not present

## 2018-05-26 DIAGNOSIS — Z01812 Encounter for preprocedural laboratory examination: Secondary | ICD-10-CM | POA: Diagnosis not present

## 2018-05-26 DIAGNOSIS — C651 Malignant neoplasm of right renal pelvis: Secondary | ICD-10-CM | POA: Diagnosis not present

## 2018-05-26 DIAGNOSIS — C652 Malignant neoplasm of left renal pelvis: Secondary | ICD-10-CM | POA: Diagnosis not present

## 2018-05-26 HISTORY — DX: Cardiac arrhythmia, unspecified: I49.9

## 2018-05-26 HISTORY — DX: Dyspnea, unspecified: R06.00

## 2018-05-26 HISTORY — DX: Genetic susceptibility to other malignant neoplasm: Z15.09

## 2018-05-26 LAB — BASIC METABOLIC PANEL
ANION GAP: 9 (ref 5–15)
BUN: 12 mg/dL (ref 8–23)
CALCIUM: 9.2 mg/dL (ref 8.9–10.3)
CO2: 32 mmol/L (ref 22–32)
CREATININE: 0.74 mg/dL (ref 0.44–1.00)
Chloride: 103 mmol/L (ref 98–111)
GFR calc Af Amer: >60 mL/min (ref >60)
Glucose, Bld: 116 mg/dL — ABNORMAL HIGH (ref 70–99)
POTASSIUM: 3.8 mmol/L (ref 3.5–5.1)
Sodium: 144 mmol/L (ref 135–145)

## 2018-05-26 LAB — HEMOGLOBIN A1C
Hgb A1c MFr Bld: 6.3 % — ABNORMAL HIGH (ref 4.8–5.6)
Mean Plasma Glucose: 134.11 mg/dL

## 2018-05-26 LAB — CBC
HEMATOCRIT: 43.1 % (ref 36.0–46.0)
Hemoglobin: 13.8 g/dL (ref 12.0–15.0)
MCH: 32.1 pg (ref 26.0–34.0)
MCHC: 32 g/dL (ref 30.0–36.0)
MCV: 100.2 fL — AB (ref 78.0–100.0)
Platelets: 260 10*3/uL (ref 150–400)
RBC: 4.3 MIL/uL (ref 3.87–5.11)
RDW: 14.7 % (ref 11.5–15.5)
WBC: 7.4 10*3/uL (ref 4.0–10.5)

## 2018-05-26 NOTE — Progress Notes (Signed)
LVM for Pam at Dr. Louis Meckel office that pt. Did not receive instructions on her Elequis. Pam called back and stated she mailed something to patient to hold Elequis for 2 days prior to surgery. I called and left voice mail with patient that she was mailed those instructions.

## 2018-06-03 ENCOUNTER — Encounter (HOSPITAL_COMMUNITY): Payer: Self-pay | Admitting: *Deleted

## 2018-06-03 ENCOUNTER — Ambulatory Visit (HOSPITAL_COMMUNITY): Payer: Medicare Other | Admitting: Anesthesiology

## 2018-06-03 ENCOUNTER — Ambulatory Visit (HOSPITAL_COMMUNITY)
Admission: RE | Admit: 2018-06-03 | Discharge: 2018-06-03 | Disposition: A | Discharge status: Home / Self Care | Payer: Medicare Other | Source: Ambulatory Visit | Attending: Urology | Admitting: Urology

## 2018-06-03 ENCOUNTER — Encounter (HOSPITAL_COMMUNITY)
Admission: RE | Disposition: A | Discharge status: Home / Self Care | Payer: Self-pay | Source: Ambulatory Visit | Attending: Urology

## 2018-06-03 ENCOUNTER — Ambulatory Visit (HOSPITAL_COMMUNITY): Payer: Medicare Other

## 2018-06-03 DIAGNOSIS — Z885 Allergy status to narcotic agent status: Secondary | ICD-10-CM | POA: Diagnosis not present

## 2018-06-03 DIAGNOSIS — C652 Malignant neoplasm of left renal pelvis: Secondary | ICD-10-CM | POA: Diagnosis not present

## 2018-06-03 DIAGNOSIS — E7800 Pure hypercholesterolemia, unspecified: Secondary | ICD-10-CM | POA: Diagnosis not present

## 2018-06-03 DIAGNOSIS — Z7901 Long term (current) use of anticoagulants: Secondary | ICD-10-CM | POA: Diagnosis not present

## 2018-06-03 DIAGNOSIS — N3030 Trigonitis without hematuria: Secondary | ICD-10-CM | POA: Diagnosis not present

## 2018-06-03 DIAGNOSIS — Z79899 Other long term (current) drug therapy: Secondary | ICD-10-CM | POA: Insufficient documentation

## 2018-06-03 DIAGNOSIS — I509 Heart failure, unspecified: Secondary | ICD-10-CM | POA: Insufficient documentation

## 2018-06-03 DIAGNOSIS — C676 Malignant neoplasm of ureteric orifice: Secondary | ICD-10-CM | POA: Insufficient documentation

## 2018-06-03 DIAGNOSIS — C641 Malignant neoplasm of right kidney, except renal pelvis: Secondary | ICD-10-CM | POA: Diagnosis not present

## 2018-06-03 DIAGNOSIS — J449 Chronic obstructive pulmonary disease, unspecified: Secondary | ICD-10-CM | POA: Diagnosis not present

## 2018-06-03 DIAGNOSIS — I4891 Unspecified atrial fibrillation: Secondary | ICD-10-CM | POA: Diagnosis not present

## 2018-06-03 DIAGNOSIS — E039 Hypothyroidism, unspecified: Secondary | ICD-10-CM | POA: Insufficient documentation

## 2018-06-03 DIAGNOSIS — E119 Type 2 diabetes mellitus without complications: Secondary | ICD-10-CM | POA: Diagnosis not present

## 2018-06-03 DIAGNOSIS — I110 Hypertensive heart disease with heart failure: Secondary | ICD-10-CM | POA: Insufficient documentation

## 2018-06-03 DIAGNOSIS — Z1509 Genetic susceptibility to other malignant neoplasm: Secondary | ICD-10-CM

## 2018-06-03 DIAGNOSIS — C689 Malignant neoplasm of urinary organ, unspecified: Secondary | ICD-10-CM | POA: Diagnosis not present

## 2018-06-03 DIAGNOSIS — R898 Other abnormal findings in specimens from other organs, systems and tissues: Secondary | ICD-10-CM | POA: Diagnosis not present

## 2018-06-03 DIAGNOSIS — D494 Neoplasm of unspecified behavior of bladder: Secondary | ICD-10-CM | POA: Diagnosis not present

## 2018-06-03 HISTORY — PX: CYSTOSCOPY/URETEROSCOPY/HOLMIUM LASER/STENT PLACEMENT: SHX6546

## 2018-06-03 HISTORY — PX: HOLMIUM LASER APPLICATION: SHX5852

## 2018-06-03 LAB — GLUCOSE, CAPILLARY: Glucose-Capillary: 139 mg/dL — ABNORMAL HIGH (ref 70–99)

## 2018-06-03 SURGERY — CYSTOSCOPY/URETEROSCOPY/HOLMIUM LASER/STENT PLACEMENT
Anesthesia: General | Laterality: Bilateral

## 2018-06-03 MED ORDER — FENTANYL CITRATE (PF) 100 MCG/2ML IJ SOLN
INTRAMUSCULAR | Status: DC | PRN
  Administered 2018-06-03 (×2): 25 ug via INTRAVENOUS
  Administered 2018-06-03: 50 ug via INTRAVENOUS

## 2018-06-03 MED ORDER — SULFAMETHOXAZOLE-TRIMETHOPRIM 800-160 MG PO TABS: 1.0000 | ORAL_TABLET | Freq: Two times a day (BID) | ORAL | 0 refills | Status: DC

## 2018-06-03 MED ORDER — PHENAZOPYRIDINE HCL 200 MG PO TABS: 200.0000 mg | ORAL_TABLET | Freq: Three times a day (TID) | ORAL | 0 refills | Status: DC | PRN

## 2018-06-03 MED ORDER — LACTATED RINGERS IV SOLN
INTRAVENOUS | Status: DC
  Administered 2018-06-03 (×2): via INTRAVENOUS

## 2018-06-03 MED ORDER — PROPOFOL 10 MG/ML IV BOLUS
INTRAVENOUS | Status: AC
  Filled 2018-06-03: qty 20

## 2018-06-03 MED ORDER — TRAMADOL HCL 50 MG PO TABS: 50.0000 mg | ORAL_TABLET | Freq: Four times a day (QID) | ORAL | 0 refills | Status: DC | PRN

## 2018-06-03 MED ORDER — SODIUM CHLORIDE 0.9 % IR SOLN
Status: DC | PRN
  Administered 2018-06-03: 6000 mL via INTRAVESICAL

## 2018-06-03 MED ORDER — ONDANSETRON HCL 4 MG/2ML IJ SOLN
INTRAMUSCULAR | Status: DC | PRN
  Administered 2018-06-03: 4 mg via INTRAVENOUS

## 2018-06-03 MED ORDER — PROPOFOL 10 MG/ML IV BOLUS
INTRAVENOUS | Status: DC | PRN
  Administered 2018-06-03: 90 mg via INTRAVENOUS

## 2018-06-03 MED ORDER — ONDANSETRON HCL 4 MG/2ML IJ SOLN
INTRAMUSCULAR | Status: AC
  Filled 2018-06-03: qty 2

## 2018-06-03 MED ORDER — FENTANYL CITRATE (PF) 100 MCG/2ML IJ SOLN: 25.0000 ug | INTRAMUSCULAR | Status: DC | PRN

## 2018-06-03 MED ORDER — DEXAMETHASONE SODIUM PHOSPHATE 10 MG/ML IJ SOLN
INTRAMUSCULAR | Status: AC
  Filled 2018-06-03: qty 1

## 2018-06-03 MED ORDER — BELLADONNA ALKALOIDS-OPIUM 16.2-30 MG RE SUPP
RECTAL | Status: AC
  Filled 2018-06-03: qty 1

## 2018-06-03 MED ORDER — DEXAMETHASONE SODIUM PHOSPHATE 10 MG/ML IJ SOLN
INTRAMUSCULAR | Status: DC | PRN
  Administered 2018-06-03: 10 mg via INTRAVENOUS

## 2018-06-03 MED ORDER — PROMETHAZINE HCL 25 MG/ML IJ SOLN: 6.2500 mg | INTRAMUSCULAR | Status: DC | PRN

## 2018-06-03 MED ORDER — GENTAMICIN SULFATE 40 MG/ML IJ SOLN
400.0000 mg | INTRAVENOUS | Status: DC
  Administered 2018-06-03: 400 mg via INTRAVENOUS
  Filled 2018-06-03 (×2): qty 10

## 2018-06-03 MED ORDER — STERILE WATER FOR IRRIGATION IR SOLN
Status: DC | PRN
  Administered 2018-06-03: 3000 mL via INTRAVESICAL

## 2018-06-03 MED ORDER — FENTANYL CITRATE (PF) 100 MCG/2ML IJ SOLN
INTRAMUSCULAR | Status: AC
  Filled 2018-06-03: qty 2

## 2018-06-03 MED ORDER — LIDOCAINE 2% (20 MG/ML) 5 ML SYRINGE
INTRAMUSCULAR | Status: DC | PRN
  Administered 2018-06-03: 60 mg via INTRAVENOUS

## 2018-06-03 MED ORDER — BELLADONNA ALKALOIDS-OPIUM 16.2-60 MG RE SUPP
RECTAL | Status: DC | PRN
  Administered 2018-06-03: 1 via RECTAL

## 2018-06-03 MED ORDER — LIDOCAINE 2% (20 MG/ML) 5 ML SYRINGE
INTRAMUSCULAR | Status: AC
  Filled 2018-06-03: qty 5

## 2018-06-03 MED ORDER — IOHEXOL 300 MG/ML  SOLN
INTRAMUSCULAR | Status: DC | PRN
  Administered 2018-06-03: 15 mL via URETHRAL

## 2018-06-03 MED ORDER — CEFAZOLIN SODIUM-DEXTROSE 2-4 GM/100ML-% IV SOLN
2.0000 g | INTRAVENOUS | Status: AC
  Administered 2018-06-03: 2 g via INTRAVENOUS
  Filled 2018-06-03: qty 100

## 2018-06-03 SURGICAL SUPPLY — 28 items
BAG URINE DRAINAGE (UROLOGICAL SUPPLIES) ×2 IMPLANT
BAG URO CATCHER STRL LF (MISCELLANEOUS) ×3 IMPLANT
BASKET ZERO TIP NITINOL 2.4FR (BASKET) IMPLANT
BSKT STON RTRVL ZERO TP 2.4FR (BASKET)
CATH FOLEY 2WAY SLVR  5CC 18FR (CATHETERS) ×2
CATH FOLEY 2WAY SLVR 5CC 18FR (CATHETERS) IMPLANT
CATH URET 5FR 28IN OPEN ENDED (CATHETERS) ×1 IMPLANT
CATH URET DUAL LUMEN 6-10FR 50 (CATHETERS) ×2 IMPLANT
CLOTH BEACON ORANGE TIMEOUT ST (SAFETY) ×1 IMPLANT
CONT SPECI 4OZ STER CLIK (MISCELLANEOUS) ×2 IMPLANT
ELECT REM PT RETURN 9FT ADLT (ELECTROSURGICAL) ×3
ELECTRODE REM PT RTRN 9FT ADLT (ELECTROSURGICAL) IMPLANT
EXTRACTOR STONE 1.7FRX115CM (UROLOGICAL SUPPLIES) IMPLANT
FIBER LASER TRAC TIP (UROLOGICAL SUPPLIES) ×2 IMPLANT
GLOVE BIOGEL M STRL SZ7.5 (GLOVE) ×3 IMPLANT
GOWN STRL REUS W/TWL XL LVL3 (GOWN DISPOSABLE) ×3 IMPLANT
GUIDEWIRE ANG ZIPWIRE 038X150 (WIRE) IMPLANT
GUIDEWIRE STR DUAL SENSOR (WIRE) ×5 IMPLANT
HOLDER FOLEY CATH W/STRAP (MISCELLANEOUS) ×2 IMPLANT
MANIFOLD NEPTUNE II (INSTRUMENTS) ×3 IMPLANT
PACK CYSTO (CUSTOM PROCEDURE TRAY) ×3 IMPLANT
SHEATH URETERAL 12FRX28CM (UROLOGICAL SUPPLIES) IMPLANT
SHEATH URETERAL 12FRX35CM (MISCELLANEOUS) IMPLANT
STENT URET 6FRX24 CONTOUR (STENTS) ×4 IMPLANT
TUBING CONNECTING 10 (TUBING) ×2 IMPLANT
TUBING CONNECTING 10' (TUBING) ×1
TUBING UROLOGY SET (TUBING) ×3 IMPLANT
WIRE COONS/BENSON .038X145CM (WIRE) IMPLANT

## 2018-06-03 NOTE — Anesthesia Procedure Notes (Signed)
Procedure Name: LMA Insertion Date/Time: 06/03/2018 7:49 AM Performed by: Stevens Magwood, Clinical cytogeneticist D, CRNA Pre-anesthesia Checklist: Patient identified, Emergency Drugs available, Suction available, Patient being monitored and Timeout performed Patient Re-evaluated:Patient Re-evaluated prior to induction Oxygen Delivery Method: Circle system utilized Preoxygenation: Pre-oxygenation with 100% oxygen Induction Type: IV induction Ventilation: Mask ventilation without difficulty LMA: LMA inserted LMA Size: 4.0 Number of attempts: 1 Placement Confirmation: positive ETCO2 and breath sounds checked- equal and bilateral Tube secured with: Tape Dental Injury: Teeth and Oropharynx as per pre-operative assessment  Comments: Placed by Viann Fish, SRNA

## 2018-06-03 NOTE — Anesthesia Preprocedure Evaluation (Signed)
Anesthesia Evaluation  Patient identified by MRN, date of birth, ID band Patient awake    Reviewed: Allergy & Precautions, NPO status , Patient's Chart, lab work & pertinent test results  Airway Mallampati: II  TM Distance: >3 FB Neck ROM: Full    Dental no notable dental hx.    Pulmonary neg pulmonary ROS,    Pulmonary exam normal breath sounds clear to auscultation       Cardiovascular hypertension, +CHF  + dysrhythmias Atrial Fibrillation  Rhythm:Irregular Rate:Normal     Neuro/Psych negative neurological ROS  negative psych ROS   GI/Hepatic negative GI ROS, Neg liver ROS, GERD  ,  Endo/Other  diabetesHypothyroidism   Renal/GU negative Renal ROS  negative genitourinary   Musculoskeletal negative musculoskeletal ROS (+)   Abdominal   Peds negative pediatric ROS (+)  Hematology negative hematology ROS (+)   Anesthesia Other Findings   Reproductive/Obstetrics negative OB ROS                             Anesthesia Physical Anesthesia Plan  ASA: III  Anesthesia Plan: General   Post-op Pain Management:    Induction: Intravenous  PONV Risk Score and Plan: 3 and Ondansetron, Dexamethasone and Treatment may vary due to age or medical condition  Airway Management Planned: LMA  Additional Equipment:   Intra-op Plan:   Post-operative Plan: Extubation in OR  Informed Consent: I have reviewed the patients History and Physical, chart, labs and discussed the procedure including the risks, benefits and alternatives for the proposed anesthesia with the patient or authorized representative who has indicated his/her understanding and acceptance.   Dental advisory given  Plan Discussed with: CRNA and Surgeon  Anesthesia Plan Comments:         Anesthesia Quick Evaluation

## 2018-06-03 NOTE — Discharge Instructions (Signed)
DISCHARGE INSTRUCTIONS FOR KIDNEY STONE/URETERAL STENT   MEDICATIONS:  1.  Resume all your other meds from home - except do not take any extra narcotic pain meds that you may have at home.  2. Pyridium is to help with the burning/stinging when you urinate. 3. Tramadol is for moderate/severe pain, otherwise taking upto 1000 mg every 6 hours of plainTylenol will help treat your pain.   4. Take Bactrim DS as prescribed. 5. Resume Eliquis once bleeding has stopped.  ACTIVITY:  1. No strenuous activity x 1week  2. No driving while on narcotic pain medications  3. Drink plenty of water  4. Continue to walk at home - you can still get blood clots when you are at home, so keep active, but don't over do it.  5. May return to work/school tomorrow or when you feel ready   BATHING:  1. You can shower and we recommend daily showers   SIGNS/SYMPTOMS TO CALL:  Please call us if you have a fever greater than 101.5, uncontrolled nausea/vomiting, uncontrolled pain, dizziness, unable to urinate, bloody urine, chest pain, shortness of breath, leg swelling, leg pain, redness around wound, drainage from wound, or any other concerns or questions.   You can reach [place] at [phone].   FOLLOW-UP:  1. You have an appointment in 2 weeks to remove your stent.

## 2018-06-03 NOTE — Transfer of Care (Signed)
Immediate Anesthesia Transfer of Care Note  Patient: [name] Case  Procedure(s) Performed: CYSTOSCOPY/BILATERAL URETEROSCOPY/ LASER TUMOR ABLATION BILATERAL / BILATERAL STENT PLACEMENT/BILATERAL RETROGRADE PYELOGRAM/BLADDER BIOPSY WITH FULGURATION (Bilateral ) HOLMIUM LASER APPLICATION (Bilateral )  Patient Location: PACU  Anesthesia Type:General  Level of Consciousness: drowsy  Airway & Oxygen Therapy: Patient Spontanous Breathing and Patient connected to face mask oxygen  Post-op Assessment: Report given to RN and Post -op Vital signs reviewed and stable  Post vital signs: Reviewed and stable  Last Vitals:  Vitals Value Taken Time  BP 110/73 06/03/2018  9:32 AM  Temp    Pulse 72 06/03/2018  9:35 AM  Resp 15 06/03/2018  9:35 AM  SpO2 96 % 06/03/2018  9:35 AM  Vitals shown include unvalidated device data.  Last Pain:  Vitals:   06/03/18 0604  TempSrc: Oral      Patients Stated Pain Goal: 3 (62/94/76 [id number])  Complications: No apparent anesthesia complications

## 2018-06-03 NOTE — Anesthesia Postprocedure Evaluation (Signed)
Anesthesia Post Note  Patient: ANSHIKA PETHTEL  Procedure(s) Performed: CYSTOSCOPY/BILATERAL URETEROSCOPY/ LASER TUMOR ABLATION BILATERAL / BILATERAL STENT PLACEMENT/BILATERAL RETROGRADE PYELOGRAM/BLADDER BIOPSY WITH FULGURATION (Bilateral ) HOLMIUM LASER APPLICATION (Bilateral )     Patient location during evaluation: PACU Anesthesia Type: General Level of consciousness: awake and alert Pain management: pain level controlled Vital Signs Assessment: post-procedure vital signs reviewed and stable Respiratory status: spontaneous breathing, nonlabored ventilation, respiratory function stable and patient connected to nasal cannula oxygen Cardiovascular status: blood pressure returned to baseline and stable Postop Assessment: no apparent nausea or vomiting Anesthetic complications: no    Last Vitals:  Vitals:   06/03/18 1045 06/03/18 1115  BP: 123/74 138/79  Pulse: 78 66  Resp: 18 18  Temp: 36.6 C 36.6 C  SpO2: 91% 92%    Last Pain:  Vitals:   06/03/18 1115  TempSrc:   PainSc: 0-No pain                 Saafir Abdullah S

## 2018-06-03 NOTE — H&P (Signed)
The patient presents today for follow-up. She initially presented to me with a 2 mm right obstructing stone. We attempted medical expulsion therapy but the patient quickly failed and returned to the emergency department 36 hours later. She subsequently underwent a right ureteral stent and was scheduled for outpatient ureteroscopy. At the time of the ureteroscopy stone was easily removed and with concern that there may be additional stone fragments in the renal pelvis I performed flexible ureteroscopy. At this time I encountered a right upper pole. Papillary lesions consistent with transitional cell carcinoma. These areas were biopsied and demonstrated low-grade urothelial carcinoma. The area was incompletely resected.   At the time of the procedure I opted to then proceed with left-sided retrograde pyelogram which also demonstrated a filling defect within the renal pelvis. I was unable to perform ureteroscopy at this time but did perform a urine cytology of the left renal pelvis. 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   The patient then underwent a CT scan, hematuria protocol. This demonstrated a filling defect within enhancing mass in the left renal pelvis as well as in the right renal pelvis/upper pole as seen on ureteroscopy.   The patient has a past medical history significant for congestive heart failure and atrial fibrillation for which she takes liquids. She also has decreased mobility because of right-sided hip pain and arthritis. She tolerated the stent over the past several weeks quite well. She did demonstrate some fatigue initially but has bounced back with quite a bit of vigor.   6/26: The patient presents today for follow-up of her bilateral ureteroscopy and renal pelvic tumor resection. 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 2 weeks prior these lesions were ablated with the laser. She tolerated the procedure well and presents today for stent removal. She has resumed her Eliquis. She denies any dysuria. She has had some mild hematuria.   Intv: 3 days after the patient's stents were removed she developed bacteremia was noted to have some hydro-with some debris within her right distal ureter. Stent was placed she was amoxicillin and discharged home after an extended hospitalization.    05/26/18: Here today for pre-procedure visit prior to undergoing repeat bilateral URS and tumor resection on 10/03 with Dr Louis Meckel. The patient reports no recent hospitalization or antimicrobial treatment for infection. She has not had any changes to medications or health diagnoses. Denies recent fevers or other constitutional signs/symptoms of infection. She has not had unilateral flank and lower back pain, gross hematuria, or painful/burning urination.     ALLERGIES: Codeine Derivatives Contrast Dye    MEDICATIONS: Levothyroxine Sodium 100 mcg vial 1 tablet PO Q AM  Metoprolol Succinate 50 mg tablet, extended release 24 hr 1 tablet PO Daily  Acetaminophen 500 mg tablet 2 tablet PO PRN  Albuterol Sulfate 2.5 mg/0.5 ml vial, nebulizer 3 ml PO Q 6 H PRN  Atorvastatin Calcium 20 mg tablet 1 tablet PO Q PM  Diltiazem 24Hr Er (Cd) 180 mg capsule, ext release 24 hr 1 capsule PO Daily  Eliquis 5 mg tablet 1 tablet PO Daily  Furosemide 80 mg tablet 1/2 tablet PO Daily  Irbesartan 150 mg tablet 1/2 tablet PO Daily  Ranitidine Hcl 150 mg tablet 1 tablet PO Daily PRN     GU PSH: Cysto Remove Stent FB Sim - 03/05/2018, 02/23/2018, 02/23/2018  Cysto Uretero Biopsy Fulgura, Right - 12/24/2017 Cysto Uretero W/excise Tumor, Bilateral - 02/10/2018, Bilateral - 01/29/2018 Cystoscopy Insert Stent, Right - 02/26/2018, Bilateral - 02/10/2018, Bilateral - 01/29/2018, Right - 12/24/2017, Right -  12/04/2017 Hysterectomy Unilat SO - 2012 Locm 300-399Mg /Ml Iodine,1Ml - 01/07/2018 Ureteroscopic stone removal, Right - 12/24/2017    NON-GU PSH: Appendectomy - 2012 Cholecystectomy (open) Colonoscopy EGD Knee Arthroscopy    GU PMH: Flank Pain (Acute), Left, Discussed resolving constipation with Mag Citrate and enema. Discussed increase in activity to help move gas/stool. Ketorolac 30 mg IM today. Recommend she avoid narcotics as much as possible due to constipation. Recommend several small meals/day and increased in fluids. Instructed to contact office if she has temp >100.5, intractable pain, or vomiting. - 02/01/2018 Renal pelvis cancer, left - 01/11/2018 Renal pelvis cancer, right - 01/11/2018 Ureteral calculus - 12/03/2017 Hemorrhagic cystitis, Acute hemorrhagic cystitis - 2016 Urinary incontinence, Unspec, Urinary incontinence - 2014 Acute Cystitis/UTI History of urolithiasis      PMH Notes: .   NON-GU PMH: Encounter for general adult medical examination without abnormal findings, Encounter for preventive health examination - 2016 Personal history of other diseases of the circulatory system, History of heart failure - 2016, History of hypertension, - 2014 Personal history of other diseases of the respiratory system, History of asthma - 2016 Personal history of other endocrine, nutritional and metabolic disease, History of type 2 diabetes mellitus - 2016, History of hypercholesterolemia, - 2014, History of hypothyroidism, - 2014 Personal history of other diseases of the digestive system, History of esophageal reflux - 2014 Arthritis Atrial Fibrillation Bronchitis, not specified as acute or chronic Cardiomegaly Congestive heart failure COPD Diabetes Type 2 Diverticulosis GERD Hyperlipidemia, unspecified Hypertension Hypothyroidism Morbid (severe) obesity with alveolar hypoventilation    FAMILY HISTORY: Cancer - Daughter Family Health Status Number - Runs In Family Father  Deceased At Age30 ___ - Runs In Family Mother Deceased At Age 39 from diabetic complicati - Runs In Family Tuberculosis - Father   SOCIAL HISTORY: Marital Status: Widowed Preferred Language: English; Ethnicity: Not Hispanic Or Latino; Race: White Current Smoking Status: Patient has never smoked.   Tobacco Use Assessment Completed: Used Tobacco in last 30 days? Does not use smokeless tobacco. Has never drank.  Does not use drugs.    REVIEW OF SYSTEMS:    GU Review Female:   Patient reports hard to postpone urination, get up at night to urinate, and leakage of urine. Patient denies frequent urination, burning /pain with urination, stream starts and stops, trouble starting your stream, have to strain to urinate, and being pregnant.  Gastrointestinal (Upper):   Patient reports indigestion/ heartburn. Patient denies nausea and vomiting.  Gastrointestinal (Lower):   Patient reports diarrhea and constipation.   Constitutional:   Patient denies fatigue, night sweats, weight loss, and fever.  Skin:   Patient denies skin rash/ lesion and itching.  Eyes:   Patient denies blurred vision and double vision.  Ears/ Nose/ Throat:   Patient denies sore throat and sinus problems.  Hematologic/Lymphatic:   Patient denies swollen glands and easy bruising.  Cardiovascular:   Patient denies leg swelling and chest pains.  Respiratory:   Patient reports cough. Patient denies shortness of breath.  Endocrine:   Patient denies excessive thirst.  Musculoskeletal:   Patient denies back pain and joint pain.  Neurological:   Patient denies headaches and dizziness.  Psychologic:   Patient denies depression and anxiety.   VITAL SIGNS:      05/26/2018 10:59 AM  Weight 210 lb / 95.25 kg  Height 60 in / 152.4 cm  BP 127/77 mmHg  Pulse 80 /min  Temperature 97.9 F / 36.6 C  BMI 41.0 kg/m   MULTI-SYSTEM PHYSICAL EXAMINATION:    Constitutional: Obese. No physical deformities. Normally developed. Good grooming.  Walks with cane.  Neck: Neck symmetrical, not swollen. Normal tracheal position.  Respiratory: No labored breathing, no use of accessory muscles.   Cardiovascular: Normal temperature, normal extremity pulses, no swelling, no varicosities.  Skin: No paleness, no jaundice, no cyanosis. No lesion, no ulcer, no rash.   Neurologic / Psychiatric: Oriented to time, oriented to place, oriented to person. No depression, no anxiety, no agitation.   Gastrointestinal: Obese abdomen. No mass, no tenderness, no rigidity.   Musculoskeletal: Normal gait and station of head and neck.     PAST DATA REVIEWED:  Source Of History:  Patient  Records Review:   Previous Doctor Records, Previous Hospital Records, Previous Patient Records  Urine Test Review:   Urinalysis   05/26/18  Urinalysis  Urine Appearance Clear   Urine Color Yellow   Urine Glucose Neg mg/dL  Urine Bilirubin Neg mg/dL  Urine Ketones Neg mg/dL  Urine Specific Gravity 1.015   Urine Blood Trace ery/uL  Urine pH 6.0   Urine Protein Neg mg/dL  Urine Urobilinogen 0.2 mg/dL  Urine Nitrites Neg   Urine Leukocyte Esterase Neg leu/uL  Urine WBC/hpf 0 - 5/hpf   Urine RBC/hpf 0 - 2/hpf   Urine Epithelial Cells 0 - 5/hpf   Urine Bacteria Mod (26-50/hpf)   Urine Mucous Present   Urine Yeast NS (Not Seen)   Urine Trichomonas Not Present   Urine Cystals NS (Not Seen)   Urine Casts NS (Not Seen)   Urine Sperm Not Present    PROCEDURES:          Urinalysis w/Scope Dipstick Dipstick Cont'd Micro  Color: Yellow Bilirubin: Neg mg/dL WBC/hpf: 0 - 5/hpf  Appearance: Clear Ketones: Neg mg/dL RBC/hpf: 0 - 2/hpf  Specific Gravity: 1.015 Blood: Trace ery/uL Bacteria: Mod (26-50/hpf)  pH: 6.0 Protein: Neg mg/dL Cystals: NS (Not Seen)  Glucose: Neg mg/dL Urobilinogen: 0.2 mg/dL Casts: NS (Not Seen)    Nitrites: Neg Trichomonas: Not Present    Leukocyte Esterase: Neg leu/uL Mucous: Present      Epithelial Cells: 0 - 5/hpf      Yeast: NS (Not Seen)       Sperm: Not Present    ASSESSMENT:      ICD-10 Details  1 GU:   Renal pelvis cancer, left - C65.2   2   Renal pelvis cancer, right - C65.1    PLAN:           Orders Labs Urine Culture          Schedule Return Visit/Planned Activity: Keep Scheduled Appointment - Schedule Surgery          Document Letter(s):  Created for Patient: Clinical Summary         Notes:   All questions answered about upcoming procedure to the best of my ability with understanding expressed by the patient. Preprocedural urine culture sent for laboratory testing.

## 2018-06-03 NOTE — Op Note (Signed)
Preoperative diagnosis:  1. 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 Same  Procedure:  1. Cystoscopy 2. bilateral ureteral stent placement 3. bilateral retrograde pyelography with interpretation  4. Bilateral ureteroscopy, laser ablation of renal pelvis tumor. 5. Transurethral bladder tumor biopsy, less than 0.5 cm, with fulguration  Surgeon: Ardis Hughs, MD  Anesthesia: General  Complications: None  Intraoperative findings: #1: The right retrograde pyelogram was performed using 10 cc of Omnipaque contrast and demonstrated normal caliber ureter with no filling defects or hydronephrosis.  There was a filling defect in the upper pole of the right kidney.  There is no other significant abnormalities of the retrograde. #2: Ureteroscopy demonstrated no mucosal abnormalities within the ureter.  Once into the renal pelvis there was a 1 cm lesion in the upper pole calyx and some smaller superficial low-grade appearing papillary areas which I ablated with the laser. #3: The left retrograde pyelogram was performed using 10 cc of Omnipaque contrast and demonstrated normal caliber ureter with no filling defects.  There were no abnormalities within the renal pelvis and no hydroureteronephrosis. #4: Ureteroscopy on the right sided demonstrated no recurrence of the patient's left high-grade invasive renal pelvic tumor.  I did re-ablate the area of the scar on the left renal pelvis. #5: Bilateral 24 cm x 6 French double-J ureteral stents were placed.  EBL: Minimal  Specimens:  #1: Right renal pelvic washing for cytology #2: Left renal pelvic washing for cytology #3: Right ureteral orifice bladder biopsy  Indication: [name] Case is a 84 y.o. patient with history of bilateral upper tract urothelial carcinoma who presents today for surveillance of her cancer 3 months after her initial treatment.. After reviewing the management options for  treatment, he elected to proceed with the above surgical procedure(s). We have discussed the potential benefits and risks of the procedure, side effects of the proposed treatment, the likelihood of the patient achieving the goals of the procedure, and any potential problems that might occur during the procedure or recuperation. Informed consent has been obtained.  Description of procedure:  The patient was taken to the operating room and general anesthesia was induced.  The patient was placed in the dorsal lithotomy position, prepped and draped in the usual sterile fashion, and preoperative antibiotics were administered. A preoperative time-out was performed.   Cystourethroscopy was performed.  The patient's urethra was examined and was normal.  Cystoscopy demonstrated an abnormality at the right ureteral orifice that was suspicious for carcinoma although not classic for urothelial carcinoma.  This area was biopsied at the end of the case.  There were no other significant bladder abnormalities.  I then performed a retrograde pyelogram on the right side with above findings.  I advanced the wire through the open-ended catheter up into the right renal pelvis removing the catheter over the wire.  I then advanced a semirigid ureteroscope through the urethra and into the right ureteral orifice and advanced up to the renal pelvis without any notable abnormality.  I advanced the second wire through the scope and slowly backed the scope out over the wire.  I then advanced a single lumen flexible ureteroscope into the right collecting system over the second wire removing a second wire.  Pyeloscopy then demonstrated the above findings.  Using a 200 m fiber I ablated all the suspicious areas including the tumor in the right upper pole.  The tissue fragments were then aspirated as part of the pelvic washing and sent as  cytology.  I then slowly backed out the ureteroscope noting no other additional lesions or  abnormalities.  Advanced a 24 cm x 6 French double-J ureteral stent over the safety wire and advanced it up to the right renal pelvis under fluoroscopic guidance.  Once it was noted to be well within the renal pelvis I slowly backed out the wire keeping pressure on the stent at the urethral meatus.  Once the wire was completely removed the stent was noted to pop into the patient's bladder.  I then reinserted the cystoscope and performed a left retrograde pyelogram through a 5 [place] open-ended catheter with the above findings.  I then advanced a wire up through the open-ended catheter and remove the catheter over the wire.  Then using a dual-lumen catheter I advanced a second wire into the left renal pelvis removing the dual-lumen catheter over both wires.  I then advanced a single lumen digital ureteroscope over the second wire and into the left renal pelvis.  Pyeloscopy was then performed and no significant ureteral or renal pelvic abnormalities were noted.  There are no recurrences within the calyces.  I did then at this time opts to ablate the previously resected tumor on the medial wall of the renal pelvis using a 200 m fiber.  Slowly backing out the ureteroscope I noted no significant abnormalities within the ureter.  I then advanced a 24 cm 6 French double-J stent over the left wire and into the left renal pelvis under fluoroscopy.  Once the stent was noted to be well within the renal pelvis I advanced the stent to the urethral meatus and with gentle pressure on the stent remove the wire.  The stent was noted to pop into the patient's bladder once the wire was completely removed.  I then reintroduced the cystoscope and with cold cup biopsy forceps biopsy the area of the right ureteral orifice.  I then fulgurated the area around the right ureter copiously to ensure hemostasis.  At this point, I opted to place a catheter.  The patient was subsequently extubated return the PACU in stable  condition.    Ardis Hughs, M.D.

## 2018-06-04 ENCOUNTER — Encounter (HOSPITAL_COMMUNITY): Payer: Self-pay | Admitting: Urology

## 2018-06-04 ENCOUNTER — Other Ambulatory Visit: Payer: Self-pay | Admitting: Internal Medicine

## 2018-06-08 DIAGNOSIS — C652 Malignant neoplasm of left renal pelvis: Secondary | ICD-10-CM | POA: Diagnosis not present

## 2018-06-08 DIAGNOSIS — C651 Malignant neoplasm of right renal pelvis: Secondary | ICD-10-CM | POA: Diagnosis not present

## 2018-06-12 ENCOUNTER — Encounter (HOSPITAL_COMMUNITY): Payer: Self-pay | Admitting: *Deleted

## 2018-06-12 ENCOUNTER — Emergency Department (HOSPITAL_COMMUNITY)
Admission: EM | Admit: 2018-06-12 | Discharge: 2018-06-12 | Disposition: A | Discharge status: Home / Self Care | Payer: Medicare Other | Attending: Emergency Medicine | Admitting: Emergency Medicine

## 2018-06-12 ENCOUNTER — Other Ambulatory Visit: Payer: Self-pay

## 2018-06-12 DIAGNOSIS — I509 Heart failure, unspecified: Secondary | ICD-10-CM | POA: Diagnosis not present

## 2018-06-12 DIAGNOSIS — J449 Chronic obstructive pulmonary disease, unspecified: Secondary | ICD-10-CM | POA: Insufficient documentation

## 2018-06-12 DIAGNOSIS — E119 Type 2 diabetes mellitus without complications: Secondary | ICD-10-CM | POA: Insufficient documentation

## 2018-06-12 DIAGNOSIS — Z7901 Long term (current) use of anticoagulants: Secondary | ICD-10-CM | POA: Diagnosis not present

## 2018-06-12 DIAGNOSIS — I110 Hypertensive heart disease with heart failure: Secondary | ICD-10-CM | POA: Insufficient documentation

## 2018-06-12 DIAGNOSIS — Z79899 Other long term (current) drug therapy: Secondary | ICD-10-CM | POA: Diagnosis not present

## 2018-06-12 DIAGNOSIS — R109 Unspecified abdominal pain: Secondary | ICD-10-CM | POA: Diagnosis not present

## 2018-06-12 DIAGNOSIS — Z85528 Personal history of other malignant neoplasm of kidney: Secondary | ICD-10-CM | POA: Diagnosis not present

## 2018-06-12 DIAGNOSIS — Z9049 Acquired absence of other specified parts of digestive tract: Secondary | ICD-10-CM | POA: Insufficient documentation

## 2018-06-12 DIAGNOSIS — N10 Acute tubulo-interstitial nephritis: Secondary | ICD-10-CM | POA: Insufficient documentation

## 2018-06-12 DIAGNOSIS — E039 Hypothyroidism, unspecified: Secondary | ICD-10-CM | POA: Insufficient documentation

## 2018-06-12 DIAGNOSIS — N12 Tubulo-interstitial nephritis, not specified as acute or chronic: Secondary | ICD-10-CM

## 2018-06-12 DIAGNOSIS — R1030 Lower abdominal pain, unspecified: Secondary | ICD-10-CM | POA: Diagnosis present

## 2018-06-12 LAB — URINALYSIS, ROUTINE W REFLEX MICROSCOPIC
BACTERIA UA: NONE SEEN
Bilirubin Urine: NEGATIVE
Glucose, UA: NEGATIVE mg/dL
Ketones, ur: NEGATIVE mg/dL
Nitrite: NEGATIVE
Protein, ur: NEGATIVE mg/dL
Specific Gravity, Urine: 1.008 (ref 1.005–1.030)
pH: 9 — ABNORMAL HIGH (ref 5.0–8.0)

## 2018-06-12 MED ORDER — CEFTRIAXONE SODIUM 1 G IJ SOLR
1.0000 g | Freq: Once | INTRAMUSCULAR | Status: AC
  Administered 2018-06-12: 1 g via INTRAMUSCULAR
  Filled 2018-06-12: qty 10

## 2018-06-12 MED ORDER — CEPHALEXIN 500 MG PO CAPS: 500.0000 mg | ORAL_CAPSULE | Freq: Three times a day (TID) | ORAL | 0 refills | Status: DC

## 2018-06-12 MED ORDER — LIDOCAINE HCL (PF) 1 % IJ SOLN
INTRAMUSCULAR | Status: AC
  Administered 2018-06-12: 2.1 mL
  Filled 2018-06-12: qty 30

## 2018-06-12 NOTE — ED Notes (Signed)
ED Provider at bedside. 

## 2018-06-12 NOTE — ED Triage Notes (Signed)
Pt c/o bilat flank pain.  Pt stated "I had the stents removed Wednesday.  The pain is getting worse."  Pt denies vomiting but c/o nausea.

## 2018-06-12 NOTE — ED Notes (Signed)
Pt reporting flank pain, nausea. Last urinated around 0430, had 85 and 9ml on bladder scan completed at the bedside.

## 2018-06-12 NOTE — Discharge Instructions (Signed)
Take tylenol, motrin for pain.   Take keflex three times daily for a week.   Take your tramadol as prescribed by your doctor for severe pain   See your urologist in a week   Return to ER if you have worse flank pain, abdominal pain, vomiting, trouble urinating, fever

## 2018-06-12 NOTE — ED Provider Notes (Signed)
  Physical Exam  BP 128/73   Pulse (!) 111   Temp 98.7 F (37.1 C) (Oral)   Resp 19   Ht 5' (1.524 m)   Wt 98.9 kg   LMP  (LMP Unknown)   SpO2 94%   BMI 42.58 kg/m   Physical Exam  ED Course/Procedures     Procedures  MDM  Care assumed at 7 am. Patient had recent bilateral stents placed that was removed. Has worse bilateral flank pain, no fever or vomiting. Previous provider called urology, who recommend UA and if positive, give abx. Recently finished a course of bactrim. Bladder scan showed no retention.   8:26 AM UA + blood and ? UTI vs inflammation. Given recent stent placement, will give rocephin, keflex. Urine culture sent.      Drenda Freeze, MD 06/12/18 [phone]

## 2018-06-12 NOTE — ED Provider Notes (Signed)
[facility] DEPT Provider Note   CSN: [id number] Arrival date & time: 06/12/18  0535     History   Chief Complaint Chief Complaint  Patient presents with  . Flank Pain    bilat    HPI [name] Case is a 84 y.o. female.  The history is provided by the patient and a relative.  Flank Pain  This is a new problem. The current episode started more than 2 days ago. The problem occurs daily. The problem has been gradually worsening. Pertinent negatives include no abdominal pain. Nothing aggravates the symptoms. Nothing relieves the symptoms.   Patient with multiple medical problems including atrial fibrillation on Eliquis, CHF, upper tract transitional cell carcinoma, diabetes presents with bilateral flank pain. Patient underwent cystoscopy, bilateral ureteral stent placement on October 3.  She had the stents removed on October 9.  She has already completed a course of antibiotics.  She has pain medications at home.  She reports that the pain in bilateral flanks is worsening.  She reports urinary frequency.  No fevers or vomiting. Past Medical History:  Diagnosis Date  . Allergy   . Arthritis   . Atrial fibrillation ([place])   . Bronchitis   . Cancer ([place])    Bilateral upper tract transitional cell carcinoma  . Cardiomegaly   . CHF (congestive heart failure) ([place])   . Complication of anesthesia    Difficult to arouse  . COPD (chronic obstructive pulmonary disease) ([facility])    borderline   pt. denies at preop  . Diabetes mellitus without complication ([place])    Controlled by diet and exercise  . Diverticulosis of sigmoid colon   . Dyspnea    at times  . Dysrhythmia    a fib  . Family history of bladder cancer   . Family history of colon cancer   . Family history of kidney cancer   . Fatty liver   . GERD (gastroesophageal reflux disease)   . History of hiatal hernia 01/20/2015   Small sliding, noted on CT  . History of kidney stones   .  Hyperlipidemia   . Hypertension   . Hypothyroidism   . Lynch syndrome   . PONV (postoperative nausea and vomiting)   . Supraumbilical hernia 24/58/0998   Small noted on CT  . Thyroid disease   . UTI (urinary tract infection)     Patient Active Problem List   Diagnosis Date Noted  . Lynch syndrome 05/05/2018  . Genetic testing 04/28/2018  . Adjustment disorder 04/23/2018  . Urothelial cancer ([place]) 04/12/2018  . Urinary tract infection with hematuria   . Lichen planus 33/82/5053  . Obesity hypoventilation syndrome ([place])   . Chronic CHF (congestive heart failure) ([place]) 01/09/2016  . History of snoring 05/20/2015  . Chronic respiratory failure with hypercapnia ([place])   . Type 2 diabetes mellitus without complication ([place])   . Atrial fibrillation ([place]) 01/06/2015  . Hypothyroidism 03/09/2007  . Hyperlipidemia 03/09/2007  . Morbid obesity ([place]) 03/09/2007  . Essential hypertension 03/09/2007  . ALLERGIC RHINITIS 03/09/2007  . Asthma 03/09/2007  . GERD 03/09/2007  . INCONTINENCE 03/09/2007    Past Surgical History:  Procedure Laterality Date  . ABDOMINAL HYSTERECTOMY    . APPENDECTOMY    . CHOLECYSTECTOMY    . COLONOSCOPY  07/16/2009  . CYSTOSCOPY W/ URETERAL STENT PLACEMENT Right 12/04/2017   Procedure: CYSTOSCOPY WITH RETROGRADE PYELOGRAM/URETERAL STENT PLACEMENT;  Surgeon: Festus Aloe, MD;  Location: [facility];  Service: Urology;  Laterality:  Right;  [name] CYSTOSCOPY W/ URETERAL STENT PLACEMENT Right 02/26/2018   Procedure: CYSTOSCOPY WITH RETROGRADE PYELOGRAM/URETERAL STENT PLACEMENT;  Surgeon: Kathie Rhodes, MD;  Location: [facility];  Service: Urology;  Laterality: Right;  . CYSTOSCOPY WITH URETEROSCOPY AND STENT PLACEMENT Bilateral 01/29/2018   Procedure: BILATERAL URETEROSCOPY AND TUMOR RESECTION  WITH LASER RIGHT STENT EXCHANGE LEFT STENT PLACEMENT;  Surgeon: Ardis Hughs, MD;  Location: [facility];  Service: Urology;  Laterality: Bilateral;  . CYSTOSCOPY WITH URETEROSCOPY  AND STENT PLACEMENT Bilateral 02/10/2018   Procedure: BILATERAL URETEROSCOPY AND TUMOR EXCISION BILATERAL STENT EXCHANGE;  Surgeon: Ardis Hughs, MD;  Location: [facility];  Service: Urology;  Laterality: Bilateral;  . CYSTOSCOPY/RETROGRADE/URETEROSCOPY Bilateral 12/24/2017   Procedure: BILATERAL URETEROSCOPY, RIGHT STENT EXCHANGE, STONE EXTRACTION WITH BASKET BILATERAL RETROGRADE PYELOGRAM, BIOPSY OF RIGHT UPPER POLE TUMOR,  BILATERAL RENAL PELVIS FLUID SENT FOR CYTOLOGY;  Surgeon: Ardis Hughs, MD;  Location: [facility];  Service: Urology;  Laterality: Bilateral;  . CYSTOSCOPY/URETEROSCOPY/HOLMIUM LASER/STENT PLACEMENT Bilateral 06/03/2018   Procedure: CYSTOSCOPY/BILATERAL URETEROSCOPY/ LASER TUMOR ABLATION BILATERAL / BILATERAL STENT PLACEMENT/BILATERAL RETROGRADE PYELOGRAM/BLADDER BIOPSY WITH FULGURATION;  Surgeon: Ardis Hughs, MD;  Location: [facility];  Service: Urology;  Laterality: Bilateral;  . HOLMIUM LASER APPLICATION Bilateral 53/01/6439   Procedure: HOLMIUM LASER APPLICATION;  Surgeon: Ardis Hughs, MD;  Location: [facility];  Service: Urology;  Laterality: Bilateral;  . KNEE SURGERY Right   . KNEE SURGERY Left    torn ligament repair  . UPPER GI ENDOSCOPY    . ureteroscopy laser tumor ablation bilateral stent placement     Dr. Louis Meckel 06-03-18      OB History   None      Home Medications    Prior to Admission medications   Medication Sig Start Date End Date Taking? Authorizing Provider  acetaminophen (TYLENOL) 500 MG tablet Take 500-1,000 mg by mouth every 8 (eight) hours as needed for moderate pain or headache.     [provider]  albuterol (PROVENTIL) (2.5 MG/3ML) 0.083% nebulizer solution Take 3 mLs (2.5 mg total) by nebulization every 6 (six) hours as needed for wheezing or shortness of breath. 10/28/17   Danford, Suann Larry, MD  apixaban (ELIQUIS) 5 MG TABS tablet Take 1 tablet (5 mg total) by mouth 2 (two) times daily. Resume once the bleeding has  stopped from the surgery for 2 days. Patient taking differently: Take 5 mg by mouth 2 (two) times daily.  02/10/18   Ardis Hughs, MD  atorvastatin (LIPITOR) 20 MG tablet TAKE 1 TABLET BY MOUTH DAILY AT 6 PM Patient taking differently: Take 20 mg by mouth every evening.  01/26/18   Hoyt Koch, MD  diltiazem (CARDIZEM CD) 180 MG 24 hr capsule TAKE 1 CAPSULE BY MOUTH DAILY Patient taking differently: Take 180 mg by mouth daily.  04/23/18   Hoyt Koch, MD  famotidine (PEPCID AC) 10 MG chewable tablet Chew 10 mg by mouth 2 (two) times daily as needed for heartburn.    [provider]  furosemide (LASIX) 80 MG tablet TAKE 1/2 TABLET BY MOUTH DAILY 06/04/18   Hoyt Koch, MD  irbesartan (AVAPRO) 150 MG tablet Take 0.5 tablets (75 mg total) by mouth daily. 06/03/17   Hoyt Koch, MD  levothyroxine (SYNTHROID, LEVOTHROID) 100 MCG tablet Take 1 tablet (100 mcg total) by mouth daily. Patient taking differently: Take 100 mcg by mouth daily before breakfast.  03/15/18   Hoyt Koch, MD  metoprolol succinate (TOPROL-XL) 50 MG  24 hr tablet TAKE 1 TABLET BY MOUTH DAILY WITH OR IMMEDIATELY FOLLOWING A MEAL Patient taking differently: Take 50 mg by mouth daily.  02/11/18   Jerline Pain, MD  ondansetron (ZOFRAN) 4 MG tablet Take 1 tablet (4 mg total) by mouth every 8 (eight) hours as needed for nausea or vomiting. 02/11/18   Ardis Hughs, MD  phenazopyridine (PYRIDIUM) 200 MG tablet Take 1 tablet (200 mg total) by mouth 3 (three) times daily as needed for pain. 06/03/18   Ardis Hughs, MD  sulfamethoxazole-trimethoprim (BACTRIM DS,SEPTRA DS) 800-160 MG tablet Take 1 tablet by mouth 2 (two) times daily. Start taking one day prior to your appointment for your first follow-up and catheter removal.  Continue taking for three days. 06/03/18   Ardis Hughs, MD  traMADol (ULTRAM) 50 MG tablet Take 1-2 tablets (50-100 mg total) by mouth every 6  (six) hours as needed for moderate pain. 06/03/18   Ardis Hughs, MD    Family History Family History  Problem Relation Age of Onset  . Heart disease Mother   . Colon cancer Mother 40       d. 75  . Tuberculosis Father   . Bladder Cancer Brother        urothelial cancer  . Bladder Cancer Brother        ureter cancer  . Kidney cancer Brother   . Skin cancer Sister   . Cancer Maternal Aunt        NOS  . Colon cancer Other        dx 1st time under 55, second time at 57  . Lung cancer Other   . Muscular dystrophy Other        d. 61  . Testicular cancer Other 24       great nephew  . Alcohol abuse Daughter   . Drug abuse Daughter   . Schizophrenia Daughter   . Colon cancer Grandchild     Social History Social History   Tobacco Use  . Smoking status: Never Smoker  . Smokeless tobacco: Never Used  Substance Use Topics  . Alcohol use: No    Alcohol/week: 0.0 standard drinks  . Drug use: No     Allergies   Codeine   Review of Systems Review of Systems  Constitutional: Negative for fever.  Gastrointestinal: Negative for abdominal pain.  Genitourinary: Positive for flank pain and frequency.  All other systems reviewed and are negative.    Physical Exam Updated Vital Signs BP 128/73   Pulse (!) 111   Temp 98.7 F (37.1 C) (Oral)   Resp 19   Ht 1.524 m (5')   Wt 98.9 kg   LMP  (LMP Unknown)   SpO2 94%   BMI 42.58 kg/m   Physical Exam CONSTITUTIONAL: Elderly, no acute distress HEAD: Normocephalic/atraumatic EYES: EOMI/PERRL ENMT: Mucous membranes moist NECK: supple no meningeal signs SPINE/BACK:entire spine nontender CV: No loud murmurs noted LUNGS: Lungs are clear to auscultation bilaterally, no apparent distress ABDOMEN: soft, nontender, no rebound or guarding, bowel sounds noted throughout abdomen GU: Mild bilateral Cva tenderness NEURO: Pt is awake/alert/appropriate, moves all extremitiesx4.     EXTREMITIES: pulses normal/equal, full  ROM SKIN: warm, color normal PSYCH: no abnormalities of mood noted, alert and oriented to situation   ED Treatments / Results  Labs (all labs ordered are listed, but only abnormal results are displayed) Labs Reviewed  URINALYSIS, [place]    EKG None  Radiology  No results found.  Procedures Procedures (including critical care time)  Medications Ordered in ED Medications - No data to display   Initial Impression / Assessment and Plan / ED Course  I have reviewed the triage vital signs and the nursing notes.  Pertinent labs results that were available during my care of the patient were reviewed by me and considered in my medical decision making (see chart for details).     6:50 AM Less than 100 mL's of urine in bladder on bladder scan.  Patient currently reports she feels comfortable.  Urinalysis pending.  Will consult urology 6:56 AM Discussed the case with urology Dr. Alinda Money.  He knows patient well.  He advised her to go the ER as she sounded like she had may have urinary retention.  At this time, there is no signs of this on clinical exam.  Plan will be to ensure patient can spontaneously urinate, and check urinalysis.  If u/a is grossly contaminated we can start abx otherwise she can be discharged home 7:18 AM Signed out to dr Darl Householder with u/a pending If negative she can be discharged home  Final Clinical Impressions(s) / ED Diagnoses   Final diagnoses:  None    ED Discharge Orders    None       Ripley Fraise, MD 06/12/18 [phone]

## 2018-06-13 LAB — URINE CULTURE: Culture: <10000 — AB

## 2018-06-15 ENCOUNTER — Other Ambulatory Visit: Payer: Self-pay | Admitting: Internal Medicine

## 2018-06-21 DIAGNOSIS — R8279 Other abnormal findings on microbiological examination of urine: Secondary | ICD-10-CM | POA: Diagnosis not present

## 2018-06-21 DIAGNOSIS — R1084 Generalized abdominal pain: Secondary | ICD-10-CM | POA: Diagnosis not present

## 2018-07-28 DIAGNOSIS — M720 Palmar fascial fibromatosis [Dupuytren]: Secondary | ICD-10-CM | POA: Diagnosis not present

## 2018-07-28 DIAGNOSIS — M79642 Pain in left hand: Secondary | ICD-10-CM | POA: Diagnosis not present

## 2018-07-29 ENCOUNTER — Emergency Department (HOSPITAL_COMMUNITY): Payer: Medicare Other

## 2018-07-29 ENCOUNTER — Encounter (HOSPITAL_COMMUNITY): Payer: Self-pay | Admitting: Internal Medicine

## 2018-07-29 ENCOUNTER — Inpatient Hospital Stay (HOSPITAL_COMMUNITY)
Admission: EM | Admit: 2018-07-29 | Discharge: 2018-07-31 | DRG: 190 | Disposition: A | Discharge status: Home Health | Payer: Medicare Other | Attending: Internal Medicine | Admitting: Internal Medicine

## 2018-07-29 DIAGNOSIS — Z8059 Family history of malignant neoplasm of other urinary tract organ: Secondary | ICD-10-CM | POA: Diagnosis not present

## 2018-07-29 DIAGNOSIS — E039 Hypothyroidism, unspecified: Secondary | ICD-10-CM | POA: Diagnosis not present

## 2018-07-29 DIAGNOSIS — N179 Acute kidney failure, unspecified: Secondary | ICD-10-CM | POA: Diagnosis not present

## 2018-07-29 DIAGNOSIS — Z9981 Dependence on supplemental oxygen: Secondary | ICD-10-CM

## 2018-07-29 DIAGNOSIS — M1990 Unspecified osteoarthritis, unspecified site: Secondary | ICD-10-CM | POA: Diagnosis present

## 2018-07-29 DIAGNOSIS — E785 Hyperlipidemia, unspecified: Secondary | ICD-10-CM

## 2018-07-29 DIAGNOSIS — Z8249 Family history of ischemic heart disease and other diseases of the circulatory system: Secondary | ICD-10-CM

## 2018-07-29 DIAGNOSIS — E119 Type 2 diabetes mellitus without complications: Secondary | ICD-10-CM | POA: Diagnosis present

## 2018-07-29 DIAGNOSIS — J9621 Acute and chronic respiratory failure with hypoxia: Secondary | ICD-10-CM | POA: Diagnosis not present

## 2018-07-29 DIAGNOSIS — Z87440 Personal history of urinary (tract) infections: Secondary | ICD-10-CM | POA: Diagnosis not present

## 2018-07-29 DIAGNOSIS — T501X5A Adverse effect of loop [high-ceiling] diuretics, initial encounter: Secondary | ICD-10-CM | POA: Diagnosis present

## 2018-07-29 DIAGNOSIS — Z8052 Family history of malignant neoplasm of bladder: Secondary | ICD-10-CM | POA: Diagnosis not present

## 2018-07-29 DIAGNOSIS — I4891 Unspecified atrial fibrillation: Secondary | ICD-10-CM | POA: Diagnosis not present

## 2018-07-29 DIAGNOSIS — I10 Essential (primary) hypertension: Secondary | ICD-10-CM

## 2018-07-29 DIAGNOSIS — I5033 Acute on chronic diastolic (congestive) heart failure: Secondary | ICD-10-CM

## 2018-07-29 DIAGNOSIS — Z885 Allergy status to narcotic agent status: Secondary | ICD-10-CM

## 2018-07-29 DIAGNOSIS — J441 Chronic obstructive pulmonary disease with (acute) exacerbation: Principal | ICD-10-CM | POA: Diagnosis present

## 2018-07-29 DIAGNOSIS — Z79890 Hormone replacement therapy (postmenopausal): Secondary | ICD-10-CM

## 2018-07-29 DIAGNOSIS — C689 Malignant neoplasm of urinary organ, unspecified: Secondary | ICD-10-CM | POA: Diagnosis not present

## 2018-07-29 DIAGNOSIS — Z90710 Acquired absence of both cervix and uterus: Secondary | ICD-10-CM

## 2018-07-29 DIAGNOSIS — R0602 Shortness of breath: Secondary | ICD-10-CM | POA: Diagnosis not present

## 2018-07-29 DIAGNOSIS — E876 Hypokalemia: Secondary | ICD-10-CM | POA: Diagnosis not present

## 2018-07-29 DIAGNOSIS — Z7901 Long term (current) use of anticoagulants: Secondary | ICD-10-CM | POA: Diagnosis not present

## 2018-07-29 DIAGNOSIS — I5032 Chronic diastolic (congestive) heart failure: Secondary | ICD-10-CM | POA: Diagnosis present

## 2018-07-29 DIAGNOSIS — R944 Abnormal results of kidney function studies: Secondary | ICD-10-CM | POA: Diagnosis present

## 2018-07-29 DIAGNOSIS — Z23 Encounter for immunization: Secondary | ICD-10-CM

## 2018-07-29 DIAGNOSIS — I110 Hypertensive heart disease with heart failure: Secondary | ICD-10-CM | POA: Diagnosis not present

## 2018-07-29 DIAGNOSIS — J9611 Chronic respiratory failure with hypoxia: Secondary | ICD-10-CM | POA: Diagnosis present

## 2018-07-29 DIAGNOSIS — Z8051 Family history of malignant neoplasm of kidney: Secondary | ICD-10-CM

## 2018-07-29 DIAGNOSIS — Z79899 Other long term (current) drug therapy: Secondary | ICD-10-CM

## 2018-07-29 DIAGNOSIS — R05 Cough: Secondary | ICD-10-CM | POA: Diagnosis not present

## 2018-07-29 LAB — COMPREHENSIVE METABOLIC PANEL
ALBUMIN: 3.3 g/dL — AB (ref 3.5–5.0)
ALT: 19 U/L (ref 0–44)
ANION GAP: 10 (ref 5–15)
AST: 26 U/L (ref 15–41)
Alkaline Phosphatase: 67 U/L (ref 38–126)
BILIRUBIN TOTAL: 0.5 mg/dL (ref 0.3–1.2)
BUN: 13 mg/dL (ref 8–23)
CO2: 32 mmol/L (ref 22–32)
Calcium: 8.7 mg/dL — ABNORMAL LOW (ref 8.9–10.3)
Chloride: 99 mmol/L (ref 98–111)
Creatinine, Ser: 0.94 mg/dL (ref 0.44–1.00)
GFR calc Af Amer: >60 mL/min (ref >60)
GFR calc non Af Amer: 56 mL/min — ABNORMAL LOW (ref >60)
GLUCOSE: 146 mg/dL — AB (ref 70–99)
POTASSIUM: 2.7 mmol/L — AB (ref 3.5–5.1)
Sodium: 141 mmol/L (ref 135–145)
TOTAL PROTEIN: 6 g/dL — AB (ref 6.5–8.1)

## 2018-07-29 LAB — CBC WITH DIFFERENTIAL/PLATELET
Abs Immature Granulocytes: 0.02 10*3/uL (ref 0.00–0.07)
BASOS ABS: 0.1 10*3/uL (ref 0.0–0.1)
BASOS PCT: 1 %
EOS PCT: 5 %
Eosinophils Absolute: 0.4 10*3/uL (ref 0.0–0.5)
HEMATOCRIT: 37.9 % (ref 36.0–46.0)
Hemoglobin: 12.3 g/dL (ref 12.0–15.0)
Immature Granulocytes: 0 %
Lymphocytes Relative: 22 %
Lymphs Abs: 1.6 10*3/uL (ref 0.7–4.0)
MCH: 31.9 pg (ref 26.0–34.0)
MCHC: 32.5 g/dL (ref 30.0–36.0)
MCV: 98.4 fL (ref 80.0–100.0)
Monocytes Absolute: 0.6 10*3/uL (ref 0.1–1.0)
Monocytes Relative: 8 %
NRBC: 0 % (ref 0.0–0.2)
Neutro Abs: 4.6 10*3/uL (ref 1.7–7.7)
Neutrophils Relative %: 64 %
Platelets: 234 10*3/uL (ref 150–400)
RBC: 3.85 MIL/uL — AB (ref 3.87–5.11)
RDW: 13.1 % (ref 11.5–15.5)
WBC: 7.1 10*3/uL (ref 4.0–10.5)

## 2018-07-29 MED ORDER — POTASSIUM CHLORIDE 10 MEQ/100ML IV SOLN
INTRAVENOUS | Status: AC
  Administered 2018-07-30: 10 meq via INTRAVENOUS
  Filled 2018-07-29: qty 100

## 2018-07-29 MED ORDER — METOPROLOL SUCCINATE ER 50 MG PO TB24
50.0000 mg | ORAL_TABLET | Freq: Every day | ORAL | Status: DC
  Administered 2018-07-30 – 2018-07-31 (×2): 50 mg via ORAL
  Filled 2018-07-29 (×2): qty 1

## 2018-07-29 MED ORDER — ALBUTEROL SULFATE (2.5 MG/3ML) 0.083% IN NEBU
2.5000 mg | INHALATION_SOLUTION | Freq: Once | RESPIRATORY_TRACT | Status: DC
  Filled 2018-07-29: qty 3

## 2018-07-29 MED ORDER — FUROSEMIDE 40 MG PO TABS
40.0000 mg | ORAL_TABLET | Freq: Every day | ORAL | Status: DC
  Administered 2018-07-30: 40 mg via ORAL
  Filled 2018-07-29: qty 1

## 2018-07-29 MED ORDER — ACETAMINOPHEN 325 MG PO TABS: 650.0000 mg | ORAL_TABLET | Freq: Four times a day (QID) | ORAL | Status: DC | PRN

## 2018-07-29 MED ORDER — DOXYCYCLINE HYCLATE 100 MG PO TABS
100.0000 mg | ORAL_TABLET | Freq: Two times a day (BID) | ORAL | Status: DC
  Administered 2018-07-29 – 2018-07-31 (×4): 100 mg via ORAL
  Filled 2018-07-29 (×4): qty 1

## 2018-07-29 MED ORDER — MAGNESIUM SULFATE IN D5W 1-5 GM/100ML-% IV SOLN
1.0000 g | Freq: Once | INTRAVENOUS | Status: AC
  Administered 2018-07-29: 1 g via INTRAVENOUS
  Filled 2018-07-29: qty 100

## 2018-07-29 MED ORDER — APIXABAN 5 MG PO TABS
5.0000 mg | ORAL_TABLET | Freq: Two times a day (BID) | ORAL | Status: DC
  Administered 2018-07-29 – 2018-07-31 (×4): 5 mg via ORAL
  Filled 2018-07-29 (×5): qty 1

## 2018-07-29 MED ORDER — DILTIAZEM HCL ER COATED BEADS 180 MG PO CP24
180.0000 mg | ORAL_CAPSULE | Freq: Every day | ORAL | Status: DC
  Administered 2018-07-30 – 2018-07-31 (×2): 180 mg via ORAL
  Filled 2018-07-29 (×2): qty 1

## 2018-07-29 MED ORDER — ZOLPIDEM TARTRATE 5 MG PO TABS: 5.0000 mg | ORAL_TABLET | Freq: Every evening | ORAL | Status: DC | PRN

## 2018-07-29 MED ORDER — IPRATROPIUM-ALBUTEROL 0.5-2.5 (3) MG/3ML IN SOLN
3.0000 mL | Freq: Once | RESPIRATORY_TRACT | Status: AC
  Administered 2018-07-29: 3 mL via RESPIRATORY_TRACT
  Filled 2018-07-29: qty 3

## 2018-07-29 MED ORDER — LEVOTHYROXINE SODIUM 100 MCG PO TABS
100.0000 ug | ORAL_TABLET | ORAL | Status: DC
  Administered 2018-07-30 – 2018-07-31 (×2): 100 ug via ORAL
  Filled 2018-07-29 (×2): qty 1

## 2018-07-29 MED ORDER — METHYLPREDNISOLONE SODIUM SUCC 125 MG IJ SOLR
125.0000 mg | Freq: Once | INTRAMUSCULAR | Status: AC
  Administered 2018-07-29: 125 mg via INTRAVENOUS
  Filled 2018-07-29: qty 2

## 2018-07-29 MED ORDER — METHYLPREDNISOLONE SODIUM SUCC 125 MG IJ SOLR
60.0000 mg | Freq: Three times a day (TID) | INTRAMUSCULAR | Status: DC
  Administered 2018-07-30 – 2018-07-31 (×5): 60 mg via INTRAVENOUS
  Filled 2018-07-29 (×5): qty 2

## 2018-07-29 MED ORDER — TRAMADOL HCL 50 MG PO TABS: 50.0000 mg | ORAL_TABLET | Freq: Four times a day (QID) | ORAL | Status: DC | PRN

## 2018-07-29 MED ORDER — POTASSIUM CHLORIDE CRYS ER 20 MEQ PO TBCR
40.0000 meq | EXTENDED_RELEASE_TABLET | Freq: Once | ORAL | Status: AC
  Administered 2018-07-29: 40 meq via ORAL
  Filled 2018-07-29: qty 2

## 2018-07-29 MED ORDER — POTASSIUM CHLORIDE 20 MEQ/15ML (10%) PO SOLN
40.0000 meq | Freq: Once | ORAL | Status: AC
  Administered 2018-07-29: 40 meq via ORAL
  Filled 2018-07-29: qty 30

## 2018-07-29 MED ORDER — IPRATROPIUM-ALBUTEROL 0.5-2.5 (3) MG/3ML IN SOLN
3.0000 mL | RESPIRATORY_TRACT | Status: DC
  Administered 2018-07-30 (×2): 3 mL via RESPIRATORY_TRACT
  Filled 2018-07-29 (×2): qty 3

## 2018-07-29 MED ORDER — HYDROXYZINE HCL 10 MG PO TABS
10.0000 mg | ORAL_TABLET | Freq: Three times a day (TID) | ORAL | Status: DC | PRN
  Filled 2018-07-29: qty 1

## 2018-07-29 MED ORDER — POTASSIUM CHLORIDE 10 MEQ/100ML IV SOLN
10.0000 meq | INTRAVENOUS | Status: DC
  Administered 2018-07-29 – 2018-07-30 (×4): 10 meq via INTRAVENOUS
  Filled 2018-07-29 (×3): qty 100

## 2018-07-29 MED ORDER — DM-GUAIFENESIN ER 30-600 MG PO TB12
1.0000 | ORAL_TABLET | Freq: Two times a day (BID) | ORAL | Status: DC | PRN
  Administered 2018-07-29: 1 via ORAL
  Filled 2018-07-29: qty 1

## 2018-07-29 MED ORDER — IRBESARTAN 75 MG PO TABS
75.0000 mg | ORAL_TABLET | Freq: Every day | ORAL | Status: DC
  Administered 2018-07-30 – 2018-07-31 (×2): 75 mg via ORAL
  Filled 2018-07-29 (×2): qty 1

## 2018-07-29 MED ORDER — ATORVASTATIN CALCIUM 20 MG PO TABS
20.0000 mg | ORAL_TABLET | Freq: Every evening | ORAL | Status: DC
  Administered 2018-07-29 – 2018-07-30 (×2): 20 mg via ORAL
  Filled 2018-07-29: qty 1
  Filled 2018-07-29 (×2): qty 2
  Filled 2018-07-29 (×2): qty 1

## 2018-07-29 MED ORDER — HYDRALAZINE HCL 20 MG/ML IJ SOLN: 5.0000 mg | INTRAMUSCULAR | Status: DC | PRN

## 2018-07-29 MED ORDER — ALBUTEROL SULFATE (2.5 MG/3ML) 0.083% IN NEBU: 2.5000 mg | INHALATION_SOLUTION | RESPIRATORY_TRACT | Status: DC | PRN

## 2018-07-29 NOTE — H&P (Addendum)
History and Physical    [name] Case XLK:[id number] DOB: 1932/03/11 DOA: 07/29/2018  Referring MD/NP/PA:   PCP: Hoyt Koch, MD   Patient coming from:  The patient is coming from home.  At baseline, pt is independent for most of ADL.        Chief Complaint: Cough, shortness of breath, wheezing  HPI: [name] Case is a 84 y.o. female with medical history significant of COPD, dCHF, hypertension, hyperlipidemia, hypothyroidism, atrial fibrillation on Eliquis, OSA not on CPAP, urothelial cancer (s/p of surgery, no chemo or radiation therapy), who presents with cough, shortness of breath and wheezing.  Patient states that she has been having shortness of breath in the past 3 days, which has been progressively getting worse.  It is associated with wheezing and cough.  She coughs up white-colored mucus.  She has oxygen desaturation to 88% on room air at home.  Denies chest pain, fever or chills.  No tenderness in calf areas.  Patient states that she used to use oxygen at home, but stopped using oxygen recently.  She often has oxygen desaturation when ambulating at home per her granddaughter. Denies nausea, vomiting, diarrhea, abdominal pain, symptoms of UTI or unilateral weakness.  ED Course: pt was found to have WBC 7.1, potassium 2.7, creatinine normal, no fever, no tachycardia, has tachypnea, oxygen saturation 91 to 94% on 2 L nasal cannula oxygen, chest x-ray showed cardiomegaly without infiltration.  Patient is placed on telemetry bed for observation.   Review of Systems:   General: no fevers, chills, no body weight gain, has fatigue HEENT: no blurry vision, hearing changes or sore throat Respiratory: Has dyspnea, coughing, wheezing CV: no chest pain, no palpitations GI: no nausea, vomiting, abdominal pain, diarrhea, constipation GU: no dysuria, burning on urination, increased urinary frequency, hematuria  Ext: has mild leg edema Neuro: no unilateral weakness, numbness, or  tingling, no vision change or hearing loss Skin: no rash, no skin tear. MSK: No muscle spasm, no deformity, no limitation of range of movement in spin Heme: No easy bruising.  Travel history: No recent long distant travel.  Allergy:  Allergies  Allergen Reactions  . Codeine Hives and Rash    In cough syrup preparations    Past Medical History:  Diagnosis Date  . Allergy   . Arthritis   . Atrial fibrillation ([place])   . Bronchitis   . Cancer ([place])    Bilateral upper tract transitional cell carcinoma  . Cardiomegaly   . CHF (congestive heart failure) ([place])   . Complication of anesthesia    Difficult to arouse  . COPD (chronic obstructive pulmonary disease) (HCC)    borderline   pt. denies at preop  . Diabetes mellitus without complication ([place])    Controlled by diet and exercise  . Diverticulosis of sigmoid colon   . Dyspnea    at times  . Dysrhythmia    a fib  . Family history of bladder cancer   . Family history of colon cancer   . Family history of kidney cancer   . Fatty liver   . GERD (gastroesophageal reflux disease)   . History of hiatal hernia 01/20/2015   Small sliding, noted on CT  . History of kidney stones   . Hyperlipidemia   . Hypertension   . Hypothyroidism   . Lynch syndrome   . PONV (postoperative nausea and vomiting)   . Supraumbilical hernia 36/64/4034   Small noted on CT  . Thyroid disease   .  UTI (urinary tract infection)     Past Surgical History:  Procedure Laterality Date  . ABDOMINAL HYSTERECTOMY    . APPENDECTOMY    . CHOLECYSTECTOMY    . COLONOSCOPY  07/16/2009  . CYSTOSCOPY W/ URETERAL STENT PLACEMENT Right 12/04/2017   Procedure: CYSTOSCOPY WITH RETROGRADE PYELOGRAM/URETERAL STENT PLACEMENT;  Surgeon: Festus Aloe, MD;  Location: [facility];  Service: Urology;  Laterality: Right;  . CYSTOSCOPY W/ URETERAL STENT PLACEMENT Right 02/26/2018   Procedure: CYSTOSCOPY WITH RETROGRADE PYELOGRAM/URETERAL STENT PLACEMENT;  Surgeon: Kathie Rhodes, MD;  Location: [facility];  Service: Urology;  Laterality: Right;  . CYSTOSCOPY WITH URETEROSCOPY AND STENT PLACEMENT Bilateral 01/29/2018   Procedure: BILATERAL URETEROSCOPY AND TUMOR RESECTION  WITH LASER RIGHT STENT EXCHANGE LEFT STENT PLACEMENT;  Surgeon: Ardis Hughs, MD;  Location: [facility];  Service: Urology;  Laterality: Bilateral;  . CYSTOSCOPY WITH URETEROSCOPY AND STENT PLACEMENT Bilateral 02/10/2018   Procedure: BILATERAL URETEROSCOPY AND TUMOR EXCISION BILATERAL STENT EXCHANGE;  Surgeon: Ardis Hughs, MD;  Location: [facility];  Service: Urology;  Laterality: Bilateral;  . CYSTOSCOPY/RETROGRADE/URETEROSCOPY Bilateral 12/24/2017   Procedure: BILATERAL URETEROSCOPY, RIGHT STENT EXCHANGE, STONE EXTRACTION WITH BASKET BILATERAL RETROGRADE PYELOGRAM, BIOPSY OF RIGHT UPPER POLE TUMOR,  BILATERAL RENAL PELVIS FLUID SENT FOR CYTOLOGY;  Surgeon: Ardis Hughs, MD;  Location: [facility];  Service: Urology;  Laterality: Bilateral;  . CYSTOSCOPY/URETEROSCOPY/HOLMIUM LASER/STENT PLACEMENT Bilateral 06/03/2018   Procedure: CYSTOSCOPY/BILATERAL URETEROSCOPY/ LASER TUMOR ABLATION BILATERAL / BILATERAL STENT PLACEMENT/BILATERAL RETROGRADE PYELOGRAM/BLADDER BIOPSY WITH FULGURATION;  Surgeon: Ardis Hughs, MD;  Location: [facility];  Service: Urology;  Laterality: Bilateral;  . HOLMIUM LASER APPLICATION Bilateral 16/09/958   Procedure: HOLMIUM LASER APPLICATION;  Surgeon: Ardis Hughs, MD;  Location: [facility];  Service: Urology;  Laterality: Bilateral;  . KNEE SURGERY Right   . KNEE SURGERY Left    torn ligament repair  . UPPER GI ENDOSCOPY    . ureteroscopy laser tumor ablation bilateral stent placement     Dr. Louis Meckel 06-03-18     Social History:  reports that she has never smoked. She has never used smokeless tobacco. She reports that she does not drink alcohol or use drugs.  Family History:  Family History  Problem Relation Age of Onset  . Heart disease Mother   . Colon cancer  Mother 68       d. 37  . Tuberculosis Father   . Bladder Cancer Brother        urothelial cancer  . Bladder Cancer Brother        ureter cancer  . Kidney cancer Brother   . Skin cancer Sister   . Cancer Maternal Aunt        NOS  . Colon cancer Other        dx 1st time under 21, second time at 69  . Lung cancer Other   . Muscular dystrophy Other        d. 44  . Testicular cancer Other 24       great nephew  . Alcohol abuse Daughter   . Drug abuse Daughter   . Schizophrenia Daughter   . Colon cancer Grandchild      Prior to Admission medications   Medication Sig Start Date End Date Taking? Authorizing Provider  acetaminophen (TYLENOL) 500 MG tablet Take 500-1,000 mg by mouth every 8 (eight) hours as needed for moderate pain or headache.    Yes [provider]  albuterol (PROVENTIL) (2.5 MG/3ML) 0.083% nebulizer solution Take 3  mLs (2.5 mg total) by nebulization every 6 (six) hours as needed for wheezing or shortness of breath. 10/28/17  Yes Danford, Suann Larry, MD  apixaban (ELIQUIS) 5 MG TABS tablet Take 1 tablet (5 mg total) by mouth 2 (two) times daily. Resume once the bleeding has stopped from the surgery for 2 days. Patient taking differently: Take 5 mg by mouth 2 (two) times daily.  02/10/18  Yes Ardis Hughs, MD  atorvastatin (LIPITOR) 20 MG tablet TAKE 1 TABLET BY MOUTH DAILY AT 6 PM Patient taking differently: Take 20 mg by mouth every evening.  01/26/18  Yes Hoyt Koch, MD  diltiazem (CARDIZEM CD) 180 MG 24 hr capsule TAKE 1 CAPSULE BY MOUTH DAILY Patient taking differently: Take 180 mg by mouth daily.  04/23/18  Yes Hoyt Koch, MD  furosemide (LASIX) 80 MG tablet TAKE 1/2 TABLET BY MOUTH DAILY Patient taking differently: Take 40 mg by mouth daily.  06/04/18  Yes Hoyt Koch, MD  irbesartan (AVAPRO) 150 MG tablet TAKE 1/2 TABLET BY MOUTH DAILY Patient taking differently: Take 75 mg by mouth daily.  06/15/18  Yes Hoyt Koch, MD  levothyroxine (SYNTHROID, LEVOTHROID) 100 MCG tablet Take 1 tablet (100 mcg total) by mouth daily. Patient taking differently: Take 100 mcg by mouth daily before breakfast.  03/15/18  Yes Hoyt Koch, MD  metoprolol succinate (TOPROL-XL) 50 MG 24 hr tablet TAKE 1 TABLET BY MOUTH DAILY WITH OR IMMEDIATELY FOLLOWING A MEAL Patient taking differently: Take 50 mg by mouth daily.  02/11/18  Yes Jerline Pain, MD  traMADol (ULTRAM) 50 MG tablet Take 1-2 tablets (50-100 mg total) by mouth every 6 (six) hours as needed for moderate pain. 06/03/18  Yes Ardis Hughs, MD  cephALEXin (KEFLEX) 500 MG capsule Take 1 capsule (500 mg total) by mouth 3 (three) times daily. Patient not taking: Reported on 07/29/2018 06/12/18   Drenda Freeze, MD  ondansetron (ZOFRAN) 4 MG tablet Take 1 tablet (4 mg total) by mouth every 8 (eight) hours as needed for nausea or vomiting. Patient not taking: Reported on 07/29/2018 02/11/18   Ardis Hughs, MD  phenazopyridine (PYRIDIUM) 200 MG tablet Take 1 tablet (200 mg total) by mouth 3 (three) times daily as needed for pain. Patient not taking: Reported on 06/12/2018 06/03/18   Ardis Hughs, MD  sulfamethoxazole-trimethoprim (BACTRIM DS,SEPTRA DS) 800-160 MG tablet Take 1 tablet by mouth 2 (two) times daily. Start taking one day prior to your appointment for your first follow-up and catheter removal.  Continue taking for three days. Patient not taking: Reported on 06/12/2018 06/03/18   Ardis Hughs, MD    Physical Exam: Vitals:   07/29/18 1953 07/29/18 2030 07/29/18 2115 07/29/18 2215  BP: 115/89 124/62 118/70   Pulse: 87 76 75 80  Resp: (!) 26 (!) 25 15 (!) 23  SpO2: 93% 91% 92% 94%  Weight:      Height:       General: Not in acute distress HEENT:       Eyes: PERRL, EOMI, no scleral icterus.       ENT: No discharge from the ears and nose, no pharynx injection, no tonsillar enlargement.        Neck: No JVD, no bruit,  no mass felt. Heme: No neck lymph node enlargement. Cardiac: S1/S2, RRR, No murmurs, No gallops or rubs. Respiratory: Has wheezing bilaterally.   GI: Soft, nondistended, nontender, no rebound pain, no organomegaly, BS present.  GU: No hematuria Ext: has 1+ leg edema bilaterally. 2+DP/PT pulse bilaterally. Musculoskeletal: No joint deformities, No joint redness or warmth, no limitation of ROM in spin. Skin: No rashes.  Neuro: Alert, oriented X3, cranial nerves II-XII grossly intact, moves all extremities normally.   Psych: Patient is not psychotic, no suicidal or hemocidal ideation.  Labs on Admission: I have personally reviewed following labs and imaging studies  CBC: Recent Labs  Lab 07/29/18 1724  WBC 7.1  NEUTROABS 4.6  HGB 12.3  HCT 37.9  MCV 98.4  PLT 478   Basic Metabolic Panel: Recent Labs  Lab 07/29/18 1724  NA 141  K 2.7*  CL 99  CO2 32  GLUCOSE 146*  BUN 13  CREATININE 0.94  CALCIUM 8.7*   GFR: Estimated Creatinine Clearance: 46.6 mL/min (by C-G formula based on SCr of 0.94 mg/dL). Liver Function Tests: Recent Labs  Lab 07/29/18 1724  AST 26  ALT 19  ALKPHOS 67  BILITOT 0.5  PROT 6.0*  ALBUMIN 3.3*   No results for input(s): LIPASE, AMYLASE in the last 168 hours. No results for input(s): AMMONIA in the last 168 hours. Coagulation Profile: No results for input(s): INR, PROTIME in the last 168 hours. Cardiac Enzymes: No results for input(s): CKTOTAL, CKMB, CKMBINDEX, TROPONINI in the last 168 hours. BNP (last 3 results) No results for input(s): PROBNP in the last 8760 hours. HbA1C: No results for input(s): HGBA1C in the last 72 hours. CBG: No results for input(s): GLUCAP in the last 168 hours. Lipid Profile: No results for input(s): CHOL, HDL, LDLCALC, TRIG, CHOLHDL, LDLDIRECT in the last 72 hours. Thyroid Function Tests: No results for input(s): TSH, T4TOTAL, FREET4, T3FREE, THYROIDAB in the last 72 hours. Anemia Panel: No results for  input(s): VITAMINB12, FOLATE, FERRITIN, TIBC, IRON, RETICCTPCT in the last 72 hours. Urine analysis:    Component Value Date/Time   COLORURINE YELLOW 06/12/2018 0608   APPEARANCEUR CLEAR 06/12/2018 0608   LABSPEC 1.008 06/12/2018 0608   PHURINE 9.0 (H) 06/12/2018 0608   GLUCOSEU NEGATIVE 06/12/2018 0608   HGBUR LARGE (A) 06/12/2018 0608   BILIRUBINUR NEGATIVE 06/12/2018 0608   BILIRUBINUR n 07/01/2012 1115   KETONESUR NEGATIVE 06/12/2018 0608   PROTEINUR NEGATIVE 06/12/2018 0608   UROBILINOGEN 0.2 01/20/2015 1153   NITRITE NEGATIVE 06/12/2018 0608   LEUKOCYTESUR LARGE (A) 06/12/2018 0608   Sepsis Labs: @LABRCNTIP (procalcitonin:4,lacticidven:4) )No results found for this or any previous visit (from the past 240 hour(s)).   Radiological Exams on Admission: Dg Chest 2 View  Result Date: 07/29/2018 CLINICAL DATA:  Cough and shortness of breath for 3 days, cough productive of white sputum, history atrial fibrillation, CHF, hypertension EXAM: CHEST - 2 VIEW COMPARISON:  03/04/2018 FINDINGS: Enlargement of cardiac silhouette. Mediastinal contours and pulmonary vascularity normal. Lungs clear. No pleural effusion or pneumothorax. Bones demineralized. IMPRESSION: Enlargement of cardiac silhouette. No acute abnormalities. Electronically Signed   By: Lavonia Dana M.D.   On: 07/29/2018 18:00     EKG: Independently reviewed.  Atrial fibrillation, QTc 531, low voltage, nonspecific T wave change.   Assessment/Plan Principal Problem:   Acute on chronic respiratory failure with hypoxia (HCC) Active Problems:   Hypothyroidism   Hyperlipidemia   Essential hypertension   Atrial fibrillation (HCC)   Type 2 diabetes mellitus without complication (HCC)   Chronic CHF (congestive heart failure) (HCC)   Hypokalemia   Urothelial cancer (HCC)   COPD exacerbation (HCC)   Acute on chronic respiratory failure with hypoxia due to COPD exacerbation: Patient  has productive cough, shortness of breath and  wheezing, no infiltration on chest x-ray, consistent with COPD exacerbation. Pt will likely need home oxygen at discharge.  -will place on tele bed for obs -Nebulizers: scheduled Duoneb and prn albuterol -Solu-Medrol 60 mg IV tid -Doxycycline -Mucinex for cough  -Incentive spirometry -Follow up sputum culture, Flu pcr -Nasal cannula oxygen as needed to maintain O2 saturation 93% or greater -will consult CM for home O2 use.  Hypothyroidism: Last TSH was on 0.77 on 03/15/18 -Continue home Synthroid  Hyperlipidemia: -lipitor  HTN:  -Continue home medications: Metoprolol, irbesartan, Cardizem -IV hydralazine prn  Atrial Fibrillation: CHA2DS2-VASc Score is 6, needs oral anticoagulation. Patient is on Eliquis at home. Heart rate is well controlled. -Continue Eliquis and metoprolol   Diet-controlled type 2 diabetes mellitus without complication ([place]): Last A1c 6.3 on 05/26/2018, well controled. Patient is not taking medications at home.  Blood sugar 146 on admission -Check CBG every morning  Hypokalemia: K=2.7 on admission. - Repleted - Check Mg level - Give 1 g of magnesium sulfate  Chronic CHF (congestive heart failure) ([place]): 2D echo 01/09/2016 showed EF 50-55%.  Patient has 1+ leg edema, but no JVD.  Chest x-ray did not show pulmonary edema.  Does not seem to have CHF exacerbation. -Continue home dose Lasix -Check BNP--> if her BNP is elevated, will give extra dose of Lasix.  Urothelial cancer: s/p of surgery. No chemo or radiation therapy.  Patient is to follow-up with the urologist. -Follow-up with Dr. Louis Meckel    DVT ppx: on Eliquis Code Status: Partial code (I discussed with the patient in the presence of granddaughter, and explained the meaning of CODE STATUS, patient wants to be partial code, OK for CPR, but no intubation). Family Communication:  Yes, patient's granddaughter   at bed side Disposition Plan:  Anticipate discharge back to previous home environment Consults  called:  none Admission status: Obs / tele     Date of Service 07/29/2018    Ivor Costa [facility] Pager [phone]  If 7PM-7AM, please contact night-coverage [email or web address] Password [email or web address] 07/29/2018, 10:45 PM

## 2018-07-29 NOTE — ED Triage Notes (Signed)
Pt here from home c/o cough and shortness of breath x3 day. States cough is productive with white sputum. VSS at this time. Hx afib, CHF, HTN.

## 2018-07-29 NOTE — ED Provider Notes (Signed)
[facility] EMERGENCY DEPARTMENT Provider Note   CSN: [id number] Arrival date & time: 07/29/18  1710     History   Chief Complaint Chief Complaint  Patient presents with  . Shortness of Breath    HPI [name] Case is a 84 y.o. female.  The history is provided by the patient. No language interpreter was used.  Shortness of Breath  This is a new problem. The average episode lasts 3 days. The problem occurs frequently.The current episode started more than 2 days ago. The problem has been gradually worsening. Associated symptoms include cough and wheezing. She has tried nothing for the symptoms. The treatment provided no relief. She has had no prior hospitalizations. Associated medical issues include COPD and heart failure.  Pt reports increasing shortness ob breath.  Pt reports she is followed by Dr. Melvyn Novas  Past Medical History:  Diagnosis Date  . Allergy   . Arthritis   . Atrial fibrillation ([place])   . Bronchitis   . Cancer ([place])    Bilateral upper tract transitional cell carcinoma  . Cardiomegaly   . CHF (congestive heart failure) ([place])   . Complication of anesthesia    Difficult to arouse  . COPD (chronic obstructive pulmonary disease) ([facility])    borderline   pt. denies at preop  . Diabetes mellitus without complication ([place])    Controlled by diet and exercise  . Diverticulosis of sigmoid colon   . Dyspnea    at times  . Dysrhythmia    a fib  . Family history of bladder cancer   . Family history of colon cancer   . Family history of kidney cancer   . Fatty liver   . GERD (gastroesophageal reflux disease)   . History of hiatal hernia 01/20/2015   Small sliding, noted on CT  . History of kidney stones   . Hyperlipidemia   . Hypertension   . Hypothyroidism   . Lynch syndrome   . PONV (postoperative nausea and vomiting)   . Supraumbilical hernia 73/53/2992   Small noted on CT  . Thyroid disease   . UTI (urinary tract infection)     Patient  Active Problem List   Diagnosis Date Noted  . Lynch syndrome 05/05/2018  . Genetic testing 04/28/2018  . Adjustment disorder 04/23/2018  . Urothelial cancer ([place]) 04/12/2018  . Urinary tract infection with hematuria   . Lichen planus 42/68/3419  . Obesity hypoventilation syndrome ([place])   . Chronic CHF (congestive heart failure) ([place]) 01/09/2016  . History of snoring 05/20/2015  . Chronic respiratory failure with hypercapnia ([place])   . Type 2 diabetes mellitus without complication ([place])   . Atrial fibrillation ([place]) 01/06/2015  . Hypothyroidism 03/09/2007  . Hyperlipidemia 03/09/2007  . Morbid obesity ([place]) 03/09/2007  . Essential hypertension 03/09/2007  . ALLERGIC RHINITIS 03/09/2007  . Asthma 03/09/2007  . GERD 03/09/2007  . INCONTINENCE 03/09/2007    Past Surgical History:  Procedure Laterality Date  . ABDOMINAL HYSTERECTOMY    . APPENDECTOMY    . CHOLECYSTECTOMY    . COLONOSCOPY  07/16/2009  . CYSTOSCOPY W/ URETERAL STENT PLACEMENT Right 12/04/2017   Procedure: CYSTOSCOPY WITH RETROGRADE PYELOGRAM/URETERAL STENT PLACEMENT;  Surgeon: Festus Aloe, MD;  Location: [facility];  Service: Urology;  Laterality: Right;  . CYSTOSCOPY W/ URETERAL STENT PLACEMENT Right 02/26/2018   Procedure: CYSTOSCOPY WITH RETROGRADE PYELOGRAM/URETERAL STENT PLACEMENT;  Surgeon: Kathie Rhodes, MD;  Location: [facility];  Service: Urology;  Laterality: Right;  . CYSTOSCOPY WITH URETEROSCOPY  AND STENT PLACEMENT Bilateral 01/29/2018   Procedure: BILATERAL URETEROSCOPY AND TUMOR RESECTION  WITH LASER RIGHT STENT EXCHANGE LEFT STENT PLACEMENT;  Surgeon: Ardis Hughs, MD;  Location: [facility];  Service: Urology;  Laterality: Bilateral;  . CYSTOSCOPY WITH URETEROSCOPY AND STENT PLACEMENT Bilateral 02/10/2018   Procedure: BILATERAL URETEROSCOPY AND TUMOR EXCISION BILATERAL STENT EXCHANGE;  Surgeon: Ardis Hughs, MD;  Location: [facility];  Service: Urology;  Laterality: Bilateral;  .  CYSTOSCOPY/RETROGRADE/URETEROSCOPY Bilateral 12/24/2017   Procedure: BILATERAL URETEROSCOPY, RIGHT STENT EXCHANGE, STONE EXTRACTION WITH BASKET BILATERAL RETROGRADE PYELOGRAM, BIOPSY OF RIGHT UPPER POLE TUMOR,  BILATERAL RENAL PELVIS FLUID SENT FOR CYTOLOGY;  Surgeon: Ardis Hughs, MD;  Location: [facility];  Service: Urology;  Laterality: Bilateral;  . CYSTOSCOPY/URETEROSCOPY/HOLMIUM LASER/STENT PLACEMENT Bilateral 06/03/2018   Procedure: CYSTOSCOPY/BILATERAL URETEROSCOPY/ LASER TUMOR ABLATION BILATERAL / BILATERAL STENT PLACEMENT/BILATERAL RETROGRADE PYELOGRAM/BLADDER BIOPSY WITH FULGURATION;  Surgeon: Ardis Hughs, MD;  Location: [facility];  Service: Urology;  Laterality: Bilateral;  . HOLMIUM LASER APPLICATION Bilateral 37/01/2830   Procedure: HOLMIUM LASER APPLICATION;  Surgeon: Ardis Hughs, MD;  Location: [facility];  Service: Urology;  Laterality: Bilateral;  . KNEE SURGERY Right   . KNEE SURGERY Left    torn ligament repair  . UPPER GI ENDOSCOPY    . ureteroscopy laser tumor ablation bilateral stent placement     Dr. Louis Meckel 06-03-18      OB History   None      Home Medications    Prior to Admission medications   Medication Sig Start Date End Date Taking? Authorizing Provider  acetaminophen (TYLENOL) 500 MG tablet Take 500-1,000 mg by mouth every 8 (eight) hours as needed for moderate pain or headache.    Yes [provider]  albuterol (PROVENTIL) (2.5 MG/3ML) 0.083% nebulizer solution Take 3 mLs (2.5 mg total) by nebulization every 6 (six) hours as needed for wheezing or shortness of breath. 10/28/17  Yes Danford, Suann Larry, MD  apixaban (ELIQUIS) 5 MG TABS tablet Take 1 tablet (5 mg total) by mouth 2 (two) times daily. Resume once the bleeding has stopped from the surgery for 2 days. Patient taking differently: Take 5 mg by mouth 2 (two) times daily.  02/10/18  Yes Ardis Hughs, MD  atorvastatin (LIPITOR) 20 MG tablet TAKE 1 TABLET BY MOUTH DAILY AT 6  PM Patient taking differently: Take 20 mg by mouth every evening.  01/26/18  Yes Hoyt Koch, MD  diltiazem (CARDIZEM CD) 180 MG 24 hr capsule TAKE 1 CAPSULE BY MOUTH DAILY Patient taking differently: Take 180 mg by mouth daily.  04/23/18  Yes Hoyt Koch, MD  furosemide (LASIX) 80 MG tablet TAKE 1/2 TABLET BY MOUTH DAILY Patient taking differently: Take 40 mg by mouth daily.  06/04/18  Yes Hoyt Koch, MD  irbesartan (AVAPRO) 150 MG tablet TAKE 1/2 TABLET BY MOUTH DAILY Patient taking differently: Take 75 mg by mouth daily.  06/15/18  Yes Hoyt Koch, MD  levothyroxine (SYNTHROID, LEVOTHROID) 100 MCG tablet Take 1 tablet (100 mcg total) by mouth daily. Patient taking differently: Take 100 mcg by mouth daily before breakfast.  03/15/18  Yes Hoyt Koch, MD  metoprolol succinate (TOPROL-XL) 50 MG 24 hr tablet TAKE 1 TABLET BY MOUTH DAILY WITH OR IMMEDIATELY FOLLOWING A MEAL Patient taking differently: Take 50 mg by mouth daily.  02/11/18  Yes Jerline Pain, MD  traMADol (ULTRAM) 50 MG tablet Take 1-2 tablets (50-100 mg total) by mouth every 6 (six)  hours as needed for moderate pain. 06/03/18  Yes Ardis Hughs, MD  cephALEXin (KEFLEX) 500 MG capsule Take 1 capsule (500 mg total) by mouth 3 (three) times daily. Patient not taking: Reported on 07/29/2018 06/12/18   Drenda Freeze, MD  ondansetron (ZOFRAN) 4 MG tablet Take 1 tablet (4 mg total) by mouth every 8 (eight) hours as needed for nausea or vomiting. Patient not taking: Reported on 07/29/2018 02/11/18   Ardis Hughs, MD  phenazopyridine (PYRIDIUM) 200 MG tablet Take 1 tablet (200 mg total) by mouth 3 (three) times daily as needed for pain. Patient not taking: Reported on 06/12/2018 06/03/18   Ardis Hughs, MD  sulfamethoxazole-trimethoprim (BACTRIM DS,SEPTRA DS) 800-160 MG tablet Take 1 tablet by mouth 2 (two) times daily. Start taking one day prior to your appointment for your  first follow-up and catheter removal.  Continue taking for three days. Patient not taking: Reported on 06/12/2018 06/03/18   Ardis Hughs, MD    Family History Family History  Problem Relation Age of Onset  . Heart disease Mother   . Colon cancer Mother 1       d. 66  . Tuberculosis Father   . Bladder Cancer Brother        urothelial cancer  . Bladder Cancer Brother        ureter cancer  . Kidney cancer Brother   . Skin cancer Sister   . Cancer Maternal Aunt        NOS  . Colon cancer Other        dx 1st time under 40, second time at 54  . Lung cancer Other   . Muscular dystrophy Other        d. 28  . Testicular cancer Other 24       great nephew  . Alcohol abuse Daughter   . Drug abuse Daughter   . Schizophrenia Daughter   . Colon cancer Grandchild     Social History Social History   Tobacco Use  . Smoking status: Never Smoker  . Smokeless tobacco: Never Used  Substance Use Topics  . Alcohol use: No    Alcohol/week: 0.0 standard drinks  . Drug use: No     Allergies   Codeine   Review of Systems Review of Systems  Respiratory: Positive for cough, shortness of breath and wheezing.   All other systems reviewed and are negative.    Physical Exam Updated Vital Signs BP 124/62   Pulse 76   Resp (!) 25   Ht 5' (1.524 m)   Wt 97.5 kg   LMP  (LMP Unknown)   SpO2 91%   BMI 41.99 kg/m   Physical Exam  Constitutional: She is oriented to person, place, and time. She appears well-developed and well-nourished.  HENT:  Head: Normocephalic.  Mouth/Throat: Oropharynx is clear and moist.  Eyes: EOM are normal.  Neck: Normal range of motion.  Cardiovascular: Normal rate.  Pulmonary/Chest: Effort normal. She has wheezes in the right upper field, the right lower field, the left upper field, the left middle field and the left lower field. She has rhonchi in the right upper field, the right middle field, the right lower field, the left upper field, the left  middle field and the left lower field.  Abdominal: She exhibits no distension.  Musculoskeletal: Normal range of motion.       Right lower leg: Normal.       Left lower leg: Normal.  Neurological:  She is alert and oriented to person, place, and time.  Skin: Skin is warm.  Psychiatric: She has a normal mood and affect.  Nursing note and vitals reviewed.    ED Treatments / Results  Labs (all labs ordered are listed, but only abnormal results are displayed) Labs Reviewed  CBC WITH DIFFERENTIAL/PLATELET - Abnormal; Notable for the following components:      Result Value   RBC 3.85 (*)    All other components within normal limits  COMPREHENSIVE METABOLIC PANEL - Abnormal; Notable for the following components:   Potassium 2.7 (*)    Glucose, Bld 146 (*)    Calcium 8.7 (*)    Total Protein 6.0 (*)    Albumin 3.3 (*)    GFR calc non Af Amer 56 (*)    All other components within normal limits    EKG EKG Interpretation  Date/Time:  Thursday July 29 2018 17:29:53 EST Ventricular Rate:  68 PR Interval:    QRS Duration: 96 QT Interval:  499 QTC Calculation: 531 R Axis:   70 Text Interpretation:  Atrial fibrillation Low voltage, precordial leads Nonspecific T abnrm, anterolateral leads Prolonged QT interval QT more prolonged, but otherwise changed Confirmed by Malvin Johns [phone]) on 07/29/2018 5:38:52 PM   Radiology Dg Chest 2 View  Result Date: 07/29/2018 CLINICAL DATA:  Cough and shortness of breath for 3 days, cough productive of white sputum, history atrial fibrillation, CHF, hypertension EXAM: CHEST - 2 VIEW COMPARISON:  03/04/2018 FINDINGS: Enlargement of cardiac silhouette. Mediastinal contours and pulmonary vascularity normal. Lungs clear. No pleural effusion or pneumothorax. Bones demineralized. IMPRESSION: Enlargement of cardiac silhouette. No acute abnormalities. Electronically Signed   By: Lavonia Dana M.D.   On: 07/29/2018 18:00    Procedures Procedures  (including critical care time)  Medications Ordered in ED Medications  potassium chloride 10 mEq in 100 mL IVPB (10 mEq Intravenous New Bag/Given (Non-Interop) 07/29/18 2117)  ipratropium-albuterol (DUONEB) 0.5-2.5 (3) MG/3ML nebulizer solution 3 mL (3 mLs Nebulization Given 07/29/18 1736)  potassium chloride SA (K-DUR,KLOR-CON) CR tablet 40 mEq (40 mEq Oral Given 07/29/18 2011)     Initial Impression / Assessment and Plan / ED Course  I have reviewed the triage vital signs and the nursing notes.  Pertinent labs & imaging results that were available during my care of the patient were reviewed by me and considered in my medical decision making (see chart for details).     Pt given duoneb.  Pt breathing better.  Pt removed from 02 and pulse ox drops to low 80's.  Labs returned and potassium is 2.7.  Pt given KDur orally and Iv potassium started. Pt reevaluated and 02 is 93-95%.  Pt reports she feels better.  I will continue albuterol neb.  Pt given solumedrol Iv  I spoke to Dr. Blaine Hamper who will see and admit   Final Clinical Impressions(s) / ED Diagnoses   Final diagnoses:  COPD exacerbation [facility])  Hypokalemia    ED Discharge Orders    None       Sidney Ace 07/29/18 2159    Malvin Johns, MD 07/29/18 2230

## 2018-07-29 NOTE — ED Notes (Signed)
Pt's O2 dropped to 88% while she attempted to sit up in bed (with good waves).  Pt dropped down several times.  RN placed O2 back on pt [place] and notified Provider.

## 2018-07-30 ENCOUNTER — Other Ambulatory Visit: Payer: Self-pay

## 2018-07-30 DIAGNOSIS — Z7901 Long term (current) use of anticoagulants: Secondary | ICD-10-CM | POA: Diagnosis not present

## 2018-07-30 DIAGNOSIS — J441 Chronic obstructive pulmonary disease with (acute) exacerbation: Secondary | ICD-10-CM | POA: Diagnosis present

## 2018-07-30 DIAGNOSIS — M1990 Unspecified osteoarthritis, unspecified site: Secondary | ICD-10-CM | POA: Diagnosis present

## 2018-07-30 DIAGNOSIS — J9621 Acute and chronic respiratory failure with hypoxia: Secondary | ICD-10-CM | POA: Diagnosis not present

## 2018-07-30 DIAGNOSIS — E119 Type 2 diabetes mellitus without complications: Secondary | ICD-10-CM | POA: Diagnosis not present

## 2018-07-30 DIAGNOSIS — E039 Hypothyroidism, unspecified: Secondary | ICD-10-CM | POA: Diagnosis not present

## 2018-07-30 DIAGNOSIS — I4891 Unspecified atrial fibrillation: Secondary | ICD-10-CM | POA: Diagnosis not present

## 2018-07-30 DIAGNOSIS — Z8051 Family history of malignant neoplasm of kidney: Secondary | ICD-10-CM | POA: Diagnosis not present

## 2018-07-30 DIAGNOSIS — I5032 Chronic diastolic (congestive) heart failure: Secondary | ICD-10-CM | POA: Diagnosis not present

## 2018-07-30 DIAGNOSIS — N179 Acute kidney failure, unspecified: Secondary | ICD-10-CM | POA: Diagnosis present

## 2018-07-30 DIAGNOSIS — T501X5A Adverse effect of loop [high-ceiling] diuretics, initial encounter: Secondary | ICD-10-CM | POA: Diagnosis present

## 2018-07-30 DIAGNOSIS — Z885 Allergy status to narcotic agent status: Secondary | ICD-10-CM | POA: Diagnosis not present

## 2018-07-30 DIAGNOSIS — Z8249 Family history of ischemic heart disease and other diseases of the circulatory system: Secondary | ICD-10-CM | POA: Diagnosis not present

## 2018-07-30 DIAGNOSIS — R944 Abnormal results of kidney function studies: Secondary | ICD-10-CM | POA: Diagnosis present

## 2018-07-30 DIAGNOSIS — I110 Hypertensive heart disease with heart failure: Secondary | ICD-10-CM | POA: Diagnosis present

## 2018-07-30 DIAGNOSIS — Z8059 Family history of malignant neoplasm of other urinary tract organ: Secondary | ICD-10-CM | POA: Diagnosis not present

## 2018-07-30 DIAGNOSIS — E876 Hypokalemia: Secondary | ICD-10-CM | POA: Diagnosis present

## 2018-07-30 DIAGNOSIS — E785 Hyperlipidemia, unspecified: Secondary | ICD-10-CM | POA: Diagnosis present

## 2018-07-30 DIAGNOSIS — Z9981 Dependence on supplemental oxygen: Secondary | ICD-10-CM | POA: Diagnosis not present

## 2018-07-30 DIAGNOSIS — Z90710 Acquired absence of both cervix and uterus: Secondary | ICD-10-CM | POA: Diagnosis not present

## 2018-07-30 DIAGNOSIS — C689 Malignant neoplasm of urinary organ, unspecified: Secondary | ICD-10-CM | POA: Diagnosis present

## 2018-07-30 DIAGNOSIS — Z8052 Family history of malignant neoplasm of bladder: Secondary | ICD-10-CM | POA: Diagnosis not present

## 2018-07-30 DIAGNOSIS — Z23 Encounter for immunization: Secondary | ICD-10-CM | POA: Diagnosis not present

## 2018-07-30 DIAGNOSIS — Z87440 Personal history of urinary (tract) infections: Secondary | ICD-10-CM | POA: Diagnosis not present

## 2018-07-30 LAB — BASIC METABOLIC PANEL
Anion gap: 11 (ref 5–15)
BUN: 13 mg/dL (ref 8–23)
CO2: 23 mmol/L (ref 22–32)
Calcium: 8.6 mg/dL — ABNORMAL LOW (ref 8.9–10.3)
Chloride: 104 mmol/L (ref 98–111)
Creatinine, Ser: 1.18 mg/dL — ABNORMAL HIGH (ref 0.44–1.00)
GFR calc Af Amer: 49 mL/min — ABNORMAL LOW (ref >60)
GFR calc non Af Amer: 42 mL/min — ABNORMAL LOW (ref >60)
GLUCOSE: 366 mg/dL — AB (ref 70–99)
Potassium: 3.3 mmol/L — ABNORMAL LOW (ref 3.5–5.1)
Sodium: 138 mmol/L (ref 135–145)

## 2018-07-30 LAB — BRAIN NATRIURETIC PEPTIDE: B Natriuretic Peptide: 177.6 pg/mL — ABNORMAL HIGH (ref 0.0–100.0)

## 2018-07-30 LAB — INFLUENZA PANEL BY PCR (TYPE A & B)
INFLBPCR: NEGATIVE
Influenza A By PCR: NEGATIVE

## 2018-07-30 LAB — HEMOGLOBIN A1C
Hgb A1c MFr Bld: 5.9 % — ABNORMAL HIGH (ref 4.8–5.6)
Mean Plasma Glucose: 122.63 mg/dL

## 2018-07-30 LAB — GLUCOSE, CAPILLARY
Glucose-Capillary: 279 mg/dL — ABNORMAL HIGH (ref 70–99)
Glucose-Capillary: 252 mg/dL — ABNORMAL HIGH (ref 70–99)
Glucose-Capillary: 251 mg/dL — ABNORMAL HIGH (ref 70–99)

## 2018-07-30 LAB — MAGNESIUM: MAGNESIUM: 1.7 mg/dL (ref 1.7–2.4)

## 2018-07-30 MED ORDER — INSULIN ASPART 100 UNIT/ML ~~LOC~~ SOLN
0.0000 [IU] | Freq: Three times a day (TID) | SUBCUTANEOUS | Status: DC
  Administered 2018-07-30 (×2): 5 [IU] via SUBCUTANEOUS

## 2018-07-30 MED ORDER — IPRATROPIUM-ALBUTEROL 0.5-2.5 (3) MG/3ML IN SOLN
3.0000 mL | Freq: Four times a day (QID) | RESPIRATORY_TRACT | Status: DC
  Administered 2018-07-30 (×2): 3 mL via RESPIRATORY_TRACT
  Filled 2018-07-30 (×2): qty 3

## 2018-07-30 MED ORDER — SODIUM CHLORIDE 0.9 % IV SOLN
INTRAVENOUS | Status: DC
  Administered 2018-07-30 – 2018-07-31 (×2): via INTRAVENOUS

## 2018-07-30 MED ORDER — INFLUENZA VAC SPLIT HIGH-DOSE 0.5 ML IM SUSY
0.5000 mL | PREFILLED_SYRINGE | INTRAMUSCULAR | Status: AC
  Administered 2018-07-31: 0.5 mL via INTRAMUSCULAR
  Filled 2018-07-30: qty 0.5

## 2018-07-30 MED ORDER — POTASSIUM CHLORIDE CRYS ER 20 MEQ PO TBCR
40.0000 meq | EXTENDED_RELEASE_TABLET | Freq: Once | ORAL | Status: AC
  Administered 2018-07-30: 40 meq via ORAL
  Filled 2018-07-30: qty 2

## 2018-07-30 NOTE — Progress Notes (Signed)
[facility]  PROGRESS NOTE  [name] Case WRU:[id number] DOB: [birth year] DOA: 07/29/2018 PCP: Hoyt Koch, MD   Brief HPI:   84 year old female with a history of COPD, diastolic CHF, hypertension, hyperlipidemia, hypothyroidism, atrial fibrillation on Eliquis, obstructive sleep apnea not on CPAP, came to hospital with shortness of breath and wheezing.  Patient admitted with COPD exacerbation.    Subjective   Patient seen and examined, she is breathing better.   Assessment/Plan:     1. Acute on chronic respiratory failure due to hypoxia-COPD exacerbation, continue Solu-Medrol, doxycycline, incentive spirometry, patient will need oxygen at home.  2. Hypothyroidism-continue Synthroid  3. Hypertension-blood pressure stable continue metoprolol, irbesartan, Cardizem.  4. Atrial fibrillation- CHA2DS2VASc score is 6, continue Eliquis for anticoagulation.  Continue metoprolol for atrial fibrillation.  5. Diabetes mellitus-last A1c was 6.3, patient not taking any medication for diabetes.  Start sliding scale insulin with NovoLog.  6. Chronic CHF-2D echo from 01/09/2016 showed EF 50 to 55%.  Patient appears to be euvolemic at this time.  Will hold Lasix.    7. Acute kidney injury-patient appears dry on examination, has worsening of creatinine  due to Lasix.  Start gentle IV hydration with normal saline at 50 mill per hour check BMP in a.m.     CBG: Recent Labs  Lab 07/30/18 0829  GLUCAP 279*    CBC: Recent Labs  Lab 07/29/18 1724  WBC 7.1  NEUTROABS 4.6  HGB 12.3  HCT 37.9  MCV 98.4  PLT 914    Basic Metabolic Panel: Recent Labs  Lab 07/29/18 1724 07/30/18 0039 07/30/18 1001  NA 141  --  138  K 2.7*  --  3.3*  CL 99  --  104  CO2 32  --  23  GLUCOSE 146*  --  366*  BUN 13  --  13  CREATININE 0.94  --  1.18*  CALCIUM 8.7*  --  8.6*  MG  --  1.7  --      DVT prophylaxis: Eliquis  Code Status: Partial code, okay for CPR but no  intubation.  Family Communication: No family at bedside  Disposition Plan: likely home when medically ready for discharge   Consultants:  None  Procedures:  None   Antibiotics:   Anti-infectives (From admission, onward)   Start     Dose/Rate Route Frequency Ordered Stop   07/29/18 2230  doxycycline (VIBRA-TABS) tablet 100 mg     100 mg Oral Every 12 hours 07/29/18 2220         Objective   Vitals:   07/29/18 2351 07/30/18 0233 07/30/18 0832 07/30/18 0856  BP: 139/80  (!) 145/96   Pulse: 63  (!) 105   Resp: 16  18   Temp: 98.2 F (36.8 C)  (!) 97.5 F (36.4 C)   TempSrc: Oral  Oral   SpO2: 97% 95% 94% 95%  Weight: 98 kg     Height: 5' (1.524 m)       Intake/Output Summary (Last 24 hours) at 07/30/2018 1347 Last data filed at 07/30/2018 1317 Gross per 24 hour  Intake 780 ml  Output 300 ml  Net 480 ml   Filed Weights   07/29/18 1727 07/29/18 2351  Weight: 97.5 kg 98 kg     Physical Examination:    General: Appears in no acute distress  Cardiovascular: S1-S2, regular, no murmur auscultated  Respiratory: Scattered wheezing in the anterior lung fields  Abdomen: Soft, nontender, no organomegaly  Extremities: No edema  to lower extremities  Neurologic: Alert, oriented x3, no focal deficit noted     Data Reviewed: I have personally reviewed following labs and imaging studies   No results found for this or any previous visit (from the past 240 hour(s)).   Liver Function Tests: Recent Labs  Lab 07/29/18 1724  AST 26  ALT 19  ALKPHOS 67  BILITOT 0.5  PROT 6.0*  ALBUMIN 3.3*   No results for input(s): LIPASE, AMYLASE in the last 168 hours. No results for input(s): AMMONIA in the last 168 hours.  Cardiac Enzymes: No results for input(s): CKTOTAL, CKMB, CKMBINDEX, TROPONINI in the last 168 hours. BNP (last 3 results) Recent Labs    10/27/17 0131 07/30/18 0039  BNP 247.9* 177.6*    ProBNP (last 3 results) No results for input(s):  PROBNP in the last 8760 hours.    Studies: Dg Chest 2 View  Result Date: 07/29/2018 CLINICAL DATA:  Cough and shortness of breath for 3 days, cough productive of white sputum, history atrial fibrillation, CHF, hypertension EXAM: CHEST - 2 VIEW COMPARISON:  03/04/2018 FINDINGS: Enlargement of cardiac silhouette. Mediastinal contours and pulmonary vascularity normal. Lungs clear. No pleural effusion or pneumothorax. Bones demineralized. IMPRESSION: Enlargement of cardiac silhouette. No acute abnormalities. Electronically Signed   By: Lavonia Dana M.D.   On: 07/29/2018 18:00    Scheduled Meds: . apixaban  5 mg Oral BID  . atorvastatin  20 mg Oral QPM  . diltiazem  180 mg Oral Daily  . doxycycline  100 mg Oral Q12H  . [START ON 07/31/2018] Influenza vac split quadrivalent PF  0.5 mL Intramuscular Tomorrow-1000  . insulin aspart  0-9 Units Subcutaneous TID WC  . ipratropium-albuterol  3 mL Nebulization Q6H  . irbesartan  75 mg Oral Daily  . levothyroxine  100 mcg Oral Q24H  . methylPREDNISolone (SOLU-MEDROL) injection  60 mg Intravenous TID  . metoprolol succinate  50 mg Oral Daily    Admission status: Observation/Inpatient: Based on patients clinical presentation and evaluation of above clinical data, I have made determination that patient meets Inpatient criteria at this time.  Patient requiring IV Solu-Medrol, requiring oxygen, not at baseline.  Time spent: 25 min  [facility] Pager [phone]. If 7PM-7AM, please contact night-coverage at [email or web address], Office  [phone]  password [email or web address]  07/30/2018, 1:47 PM  LOS: 0 days

## 2018-07-30 NOTE — Plan of Care (Signed)
Discussed with patient and family plan of care for the evening, pain management and potential discharge with IV steroids and doctor giving IVF to rehydrate with some teach back displayed.

## 2018-07-31 DIAGNOSIS — I4891 Unspecified atrial fibrillation: Secondary | ICD-10-CM

## 2018-07-31 DIAGNOSIS — Z23 Encounter for immunization: Secondary | ICD-10-CM | POA: Diagnosis not present

## 2018-07-31 LAB — BASIC METABOLIC PANEL
ANION GAP: 10 (ref 5–15)
BUN: 21 mg/dL (ref 8–23)
CO2: 27 mmol/L (ref 22–32)
Calcium: 8.6 mg/dL — ABNORMAL LOW (ref 8.9–10.3)
Chloride: 102 mmol/L (ref 98–111)
Creatinine, Ser: 1.06 mg/dL — ABNORMAL HIGH (ref 0.44–1.00)
GFR calc Af Amer: 56 mL/min — ABNORMAL LOW (ref >60)
GFR calc non Af Amer: 48 mL/min — ABNORMAL LOW (ref >60)
GLUCOSE: 254 mg/dL — AB (ref 70–99)
Potassium: 3.8 mmol/L (ref 3.5–5.1)
Sodium: 139 mmol/L (ref 135–145)

## 2018-07-31 LAB — GLUCOSE, CAPILLARY
Glucose-Capillary: 245 mg/dL — ABNORMAL HIGH (ref 70–99)
Glucose-Capillary: 242 mg/dL — ABNORMAL HIGH (ref 70–99)

## 2018-07-31 MED ORDER — PHENOL 1.4 % MT LIQD
1.0000 | OROMUCOSAL | Status: DC | PRN
  Filled 2018-07-31: qty 177

## 2018-07-31 MED ORDER — TIOTROPIUM BROMIDE MONOHYDRATE 18 MCG IN CAPS: 18.0000 ug | ORAL_CAPSULE | Freq: Every day | RESPIRATORY_TRACT | 1 refills | Status: DC

## 2018-07-31 MED ORDER — ALBUTEROL SULFATE (2.5 MG/3ML) 0.083% IN NEBU: 2.5000 mg | INHALATION_SOLUTION | Freq: Four times a day (QID) | RESPIRATORY_TRACT | 2 refills | Status: DC | PRN

## 2018-07-31 MED ORDER — PREDNISONE 10 MG PO TABS: 40.0000 mg | ORAL_TABLET | Freq: Every day | ORAL | 0 refills | Status: DC

## 2018-07-31 MED ORDER — IRBESARTAN 150 MG PO TABS: 75.0000 mg | ORAL_TABLET | Freq: Every day | ORAL | Status: DC

## 2018-07-31 MED ORDER — IPRATROPIUM-ALBUTEROL 0.5-2.5 (3) MG/3ML IN SOLN
3.0000 mL | Freq: Three times a day (TID) | RESPIRATORY_TRACT | Status: DC
  Administered 2018-07-31: 3 mL via RESPIRATORY_TRACT
  Filled 2018-07-31 (×2): qty 3

## 2018-07-31 MED ORDER — FUROSEMIDE 80 MG PO TABS: 40.0000 mg | ORAL_TABLET | Freq: Every day | ORAL | 1 refills | Status: DC | PRN

## 2018-07-31 MED ORDER — INSULIN ASPART 100 UNIT/ML ~~LOC~~ SOLN
0.0000 [IU] | Freq: Three times a day (TID) | SUBCUTANEOUS | Status: DC
  Administered 2018-07-31 (×2): 3 [IU] via SUBCUTANEOUS

## 2018-07-31 MED ORDER — DOXYCYCLINE HYCLATE 100 MG PO TABS: 100.0000 mg | ORAL_TABLET | Freq: Two times a day (BID) | ORAL | 0 refills | Status: DC

## 2018-07-31 NOTE — Discharge Summary (Signed)
Physician Discharge Summary  DANNI SHIMA QBH:[id number] DOB: [birth year] DOA: 07/29/2018  PCP: Hoyt Koch, MD  Admit date: 07/29/2018 Discharge date: 07/31/2018  Admitted From: home Disposition:  Home  Recommendations for Outpatient Follow-up:  1. Follow up with PCP in 1-2 weeks  Home Health: PT Equipment/Devices: home O2  Discharge Condition: stable CODE STATUS: Partial Diet recommendation: heart healthy  HPI: Per Dr. Blaine Hamper, [name] Case is a 84 y.o. female with medical history significant of COPD, dCHF, hypertension, hyperlipidemia, hypothyroidism, atrial fibrillation on Eliquis, OSA not on CPAP, urothelial cancer (s/p of surgery, no chemo or radiation therapy), who presents with cough, shortness of breath and wheezing. Patient states that she has been having shortness of breath in the past 3 days, which has been progressively getting worse.  It is associated with wheezing and cough.  She coughs up white-colored mucus.  She has oxygen desaturation to 88% on room air at home.  Denies chest pain, fever or chills.  No tenderness in calf areas.  Patient states that she used to use oxygen at home, but stopped using oxygen recently.  She often has oxygen desaturation when ambulating at home per her granddaughter. Denies nausea, vomiting, diarrhea, abdominal pain, symptoms of UTI or unilateral weakness.  Hospital Course:  Principal problem Acute on chronic respiratory failure due to hypoxia in the setting of COPD exacerbation-patient was admitted to the hospital with hypoxic respiratory failure in the setting of suspected COPD exacerbation. She was placed on nebulizers, steroids, antibiotics and oxygen. She improved with supportive treatment, wheezing has resolved and patient returned to baseline. There was high suspicion for chronic hypoxia since patient has been dyspneic at home with wheezing and chronic cough for months. She did require 2L [place] on discharge, and her she was  also given a quick prednisone taper and antibiotics for a few days  Additional problems Hypothyroidism-continue Synthroid Hypertension-blood pressure stable continue metoprolol, irbesartan, Cardizem. Atrial fibrillation- CHA2DS2VASc score is 6, continue Eliquis for anticoagulation.  Continue metoprolol for atrial fibrillation. Diabetes mellitus-last A1c 5.9. Counseled for dietary changes Chronic diastolic OXB-3Z echo from 01/09/2016 showed EF 50 to 55%.  Patient appears to be euvolemic at this time.  Will change Lasix to as needed on discharge Acute kidney injury-patient appears dry on examination, has worsening of creatinine  due to Lasix.  Kidney function improved with fluids.  Changed Lasix to as needed on discharge, advised for daily weights  Discharge Diagnoses:  Principal Problem:   Acute on chronic respiratory failure with hypoxia (HCC) Active Problems:   Hypothyroidism   Hyperlipidemia   Essential hypertension   Atrial fibrillation (HCC)   Type 2 diabetes mellitus without complication (HCC)   Chronic CHF (congestive heart failure) (HCC)   Hypokalemia   Urothelial cancer (HCC)   COPD exacerbation ([place])   COPD with acute exacerbation ([employer])     Discharge Instructions   Allergies as of 07/31/2018      Reactions   Codeine Hives, Rash   In cough syrup preparations      Medication List    STOP taking these medications   cephALEXin 500 MG capsule Commonly known as:  KEFLEX   ondansetron 4 MG tablet Commonly known as:  ZOFRAN   phenazopyridine 200 MG tablet Commonly known as:  PYRIDIUM   sulfamethoxazole-trimethoprim 800-160 MG tablet Commonly known as:  BACTRIM DS,SEPTRA DS     TAKE these medications   acetaminophen 500 MG tablet Commonly known as:  TYLENOL Take 500-1,000 mg by mouth every  8 (eight) hours as needed for moderate pain or headache.   albuterol (2.5 MG/3ML) 0.083% nebulizer solution Commonly known as:  PROVENTIL Take 3 mLs (2.5 mg total) by  nebulization every 6 (six) hours as needed for wheezing or shortness of breath.   apixaban 5 MG Tabs tablet Commonly known as:  ELIQUIS Take 1 tablet (5 mg total) by mouth 2 (two) times daily. Resume once the bleeding has stopped from the surgery for 2 days. What changed:  additional instructions   atorvastatin 20 MG tablet Commonly known as:  LIPITOR TAKE 1 TABLET BY MOUTH DAILY AT 6 PM What changed:    how much to take  how to take this  when to take this  additional instructions   diltiazem 180 MG 24 hr capsule Commonly known as:  CARDIZEM CD TAKE 1 CAPSULE BY MOUTH DAILY   doxycycline 100 MG tablet Commonly known as:  VIBRA-TABS Take 1 tablet (100 mg total) by mouth every 12 (twelve) hours.   furosemide 80 MG tablet Commonly known as:  LASIX Take 0.5 tablets (40 mg total) by mouth daily as needed for fluid or edema. What changed:    when to take this  reasons to take this   irbesartan 150 MG tablet Commonly known as:  AVAPRO Take 0.5 tablets (75 mg total) by mouth daily.   levothyroxine 100 MCG tablet Commonly known as:  SYNTHROID, LEVOTHROID Take 1 tablet (100 mcg total) by mouth daily. What changed:  when to take this   metoprolol succinate 50 MG 24 hr tablet Commonly known as:  TOPROL-XL TAKE 1 TABLET BY MOUTH DAILY WITH OR IMMEDIATELY FOLLOWING A MEAL What changed:  See the new instructions.   predniSONE 10 MG tablet Commonly known as:  DELTASONE Take 4 tablets (40 mg total) by mouth daily. 4 tablets daily x 2 days then 3 daily x 2 days then 2 daily x 2 days then 1 daily x 2 days (20)   tiotropium 18 MCG inhalation capsule Commonly known as:  SPIRIVA Place 1 capsule (18 mcg total) into inhaler and inhale daily.   traMADol 50 MG tablet Commonly known as:  ULTRAM Take 1-2 tablets (50-100 mg total) by mouth every 6 (six) hours as needed for moderate pain.            Durable Medical Equipment  (From admission, onward)         Start      Ordered   07/31/18 1149  For home use only DME oxygen  Once    Question Answer Comment  Mode or (Route) Nasal cannula   Liters per Minute 2   Frequency Continuous (stationary and portable oxygen unit needed)   Oxygen delivery system Gas      07/31/18 1149         Follow-up Information    Hoyt Koch, MD. Schedule an appointment as soon as possible for a visit in 1 week(s).   Specialty:  Internal Medicine Contact information: [place] [address] [phone]        [facility] Follow [facility].   Specialty:  Home Health Services Why:  for home health services. They will call you in the next 1-2 days to set up your first home appointment. You will have oxygen for home use delivered to your room prior to DC Contact information: [address] [place] [address] [phone]           Consultations:  Procedures/Studies:  Dg Chest 2 View  Result Date: 07/29/2018 CLINICAL DATA:  Cough and shortness of breath for 3 days, cough productive of white sputum, history atrial fibrillation, CHF, hypertension EXAM: CHEST - 2 VIEW COMPARISON:  03/04/2018 FINDINGS: Enlargement of cardiac silhouette. Mediastinal contours and pulmonary vascularity normal. Lungs clear. No pleural effusion or pneumothorax. Bones demineralized. IMPRESSION: Enlargement of cardiac silhouette. No acute abnormalities. Electronically Signed   By: Lavonia Dana M.D.   On: 07/29/2018 18:00      Subjective: - no chest pain, shortness of breath, no abdominal pain, nausea or vomiting.   Discharge Exam: Vitals:   07/31/18 0713 07/31/18 0803  BP: 129/78   Pulse: (!) 175   Resp:    Temp: 97.9 F (36.6 C)   SpO2: (!) 78% 96%   Addendum: pulse is an error, no such tachycardia noted on telemetry, heart rate 93 on discharge  General: Pt is alert, awake, not in acute distress Cardiovascular: RRR, S1/S2 +, no rubs, no gallops Respiratory: CTA bilaterally,  no wheezing, no rhonchi Abdominal: Soft, NT, ND, bowel sounds + Extremities: no edema, no cyanosis    The results of significant diagnostics from this hospitalization (including imaging, microbiology, ancillary and laboratory) are listed below for reference.     Microbiology: No results found for this or any previous visit (from the past 240 hour(s)).   Labs: BNP (last 3 results) Recent Labs    10/27/17 0131 07/30/18 0039  BNP 247.9* 973.5*   Basic Metabolic Panel: Recent Labs  Lab 07/29/18 1724 07/30/18 0039 07/30/18 1001 07/31/18 0208  NA 141  --  138 139  K 2.7*  --  3.3* 3.8  CL 99  --  104 102  CO2 32  --  23 27  GLUCOSE 146*  --  366* 254*  BUN 13  --  13 21  CREATININE 0.94  --  1.18* 1.06*  CALCIUM 8.7*  --  8.6* 8.6*  MG  --  1.7  --   --    Liver Function Tests: Recent Labs  Lab 07/29/18 1724  AST 26  ALT 19  ALKPHOS 67  BILITOT 0.5  PROT 6.0*  ALBUMIN 3.3*   No results for input(s): LIPASE, AMYLASE in the last 168 hours. No results for input(s): AMMONIA in the last 168 hours. CBC: Recent Labs  Lab 07/29/18 1724  WBC 7.1  NEUTROABS 4.6  HGB 12.3  HCT 37.9  MCV 98.4  PLT 234   Cardiac Enzymes: No results for input(s): CKTOTAL, CKMB, CKMBINDEX, TROPONINI in the last 168 hours. BNP: Invalid input(s): POCBNP CBG: Recent Labs  Lab 07/30/18 0829 07/30/18 1649 07/30/18 2148 07/31/18 0710 07/31/18 1126  GLUCAP 279* 252* 251* 245* 242*   D-Dimer No results for input(s): DDIMER in the last 72 hours. Hgb A1c Recent Labs    07/30/18 1001  HGBA1C 5.9*   Lipid Profile No results for input(s): CHOL, HDL, LDLCALC, TRIG, CHOLHDL, LDLDIRECT in the last 72 hours. Thyroid function studies No results for input(s): TSH, T4TOTAL, T3FREE, THYROIDAB in the last 72 hours.  Invalid input(s): FREET3 Anemia work up No results for input(s): VITAMINB12, FOLATE, FERRITIN, TIBC, IRON, RETICCTPCT in the last 72 hours. Urinalysis    Component Value  Date/Time   COLORURINE YELLOW 06/12/2018 0608   APPEARANCEUR CLEAR 06/12/2018 0608   LABSPEC 1.008 06/12/2018 0608   PHURINE 9.0 (H) 06/12/2018 0608   GLUCOSEU NEGATIVE 06/12/2018 0608   HGBUR LARGE (A) 06/12/2018 0608   BILIRUBINUR NEGATIVE 06/12/2018 3299  BILIRUBINUR n 07/01/2012 1115   KETONESUR NEGATIVE 06/12/2018 0608   PROTEINUR NEGATIVE 06/12/2018 0608   UROBILINOGEN 0.2 01/20/2015 1153   NITRITE NEGATIVE 06/12/2018 0608   LEUKOCYTESUR LARGE (A) 06/12/2018 0608   Sepsis Labs Invalid input(s): PROCALCITONIN,  WBC,  LACTICIDVEN   Time coordinating discharge: 35 minutes  SIGNED:  Marzetta Board, MD  [facility] 07/31/2018, 3:30 PM Pager [phone]  If 7PM-7AM, please contact night-coverage [email or web address] Password [email or web address]

## 2018-07-31 NOTE — Care Management Note (Signed)
Case Management Note  Patient Details  Name: [name] Case MRN: [id number] Date of Birth: [birth year]  Subjective/Objective:                    Action/Plan:  Spoke with patient at bedside. She states that she would like to use [facility] for [facility] PT and O2. She states she has used them before in the past, she declined to list of [facility] agencies.  O2 to be delivered to room prior to DC.   Expected Discharge Date:  07/31/18               Expected Discharge Plan:  [place]  In-House Referral:     Discharge planning Services  CM Consult  Post Acute Care Choice:  Home Health, Durable Medical Equipment Choice offered to:  Patient  DME Arranged:  Oxygen DME Agency:  [place]:  PT [place]:  [place]  Status of Service:  Completed, signed off  If discussed at [place] of Stay Meetings, dates discussed:    Additional Comments:  Carles Collet, RN 07/31/2018, 12:42 PM

## 2018-07-31 NOTE — Evaluation (Signed)
SATURATION QUALIFICATIONS: (This note is used to comply with regulatory documentation for home oxygen)  Patient Saturations on Room Air at Rest = 95%  Patient Saturations on Room Air while Ambulating = 87%  Patient Saturations on 2 Liters of oxygen while Ambulating = 97%

## 2018-07-31 NOTE — Discharge Instructions (Signed)
Follow with Hoyt Koch, MD in 5-7 days  Please get a complete blood count and chemistry panel checked by your Primary MD at your next visit, and again as instructed by your Primary MD. Please get your medications reviewed and adjusted by your Primary MD.  Please request your Primary MD to go over all Hospital Tests and Procedure/Radiological results at the follow up, please get all Hospital records sent to your Prim MD by signing hospital release before you go home.  If you had Pneumonia of Lung problems at the Hospital: Please get a 2 view Chest X ray done in 6-8 weeks after hospital discharge or sooner if instructed by your Primary MD.  If you have Congestive Heart Failure: Please call your Cardiologist or Primary MD anytime you have any of the following symptoms:  1) 3 pound weight gain in 24 hours or 5 pounds in 1 week  2) shortness of breath, with or without a dry hacking cough  3) swelling in the hands, feet or stomach  4) if you have to sleep on extra pillows at night in order to breathe  Follow cardiac low salt diet and 1.5 lit/day fluid restriction.  If you have diabetes Accuchecks 4 times/day, Once in AM empty stomach and then before each meal. Log in all results and show them to your primary doctor at your next visit. If any glucose reading is under 80 or above 300 call your primary MD immediately.  If you have Seizure/Convulsions/Epilepsy: Please do not drive, operate heavy machinery, participate in activities at heights or participate in high speed sports until you have seen by Primary MD or a Neurologist and advised to do so again.  If you had Gastrointestinal Bleeding: Please ask your Primary MD to check a complete blood count within one week of discharge or at your next visit. Your endoscopic/colonoscopic biopsies that are pending at the time of discharge, will also need to followed by your Primary MD.  Get Medicines reviewed and adjusted. Please take all your  medications with you for your next visit with your Primary MD  Please request your Primary MD to go over all hospital tests and procedure/radiological results at the follow up, please ask your Primary MD to get all Hospital records sent to his/her office.  If you experience worsening of your admission symptoms, develop shortness of breath, life threatening emergency, suicidal or homicidal thoughts you must seek medical attention immediately by calling 911 or calling your MD immediately  if symptoms less severe.  You must read complete instructions/literature along with all the possible adverse reactions/side effects for all the Medicines you take and that have been prescribed to you. Take any new Medicines after you have completely understood and accpet all the possible adverse reactions/side effects.   Do not drive or operate heavy machinery when taking Pain medications.   Do not take more than prescribed Pain, Sleep and Anxiety Medications  Special Instructions: If you have smoked or chewed Tobacco  in the last 2 yrs please stop smoking, stop any regular Alcohol  and or any Recreational drug use.  Wear Seat belts while driving.  Please note You were cared for by a hospitalist during your hospital stay. If you have any questions about your discharge medications or the care you received while you were in the hospital after you are discharged, you can call the unit and asked to speak with the hospitalist on call if the hospitalist that took care of you is not available.  Once you are discharged, your primary care physician will handle any further medical issues. Please note that NO REFILLS for any discharge medications will be authorized once you are discharged, as it is imperative that you return to your primary care physician (or establish a relationship with a primary care physician if you do not have one) for your aftercare needs so that they can reassess your need for medications and monitor your  lab values.  You can reach the hospitalist office at phone [phone] or fax [phone]   If you do not have a primary care physician, you can call [phone] for a physician referral.  Activity: As tolerated with Full fall precautions use walker/cane & assistance as needed  Diet: low salt, low carb  Disposition Home

## 2018-08-02 ENCOUNTER — Encounter (HOSPITAL_COMMUNITY): Payer: Self-pay | Admitting: *Deleted

## 2018-08-02 ENCOUNTER — Emergency Department (HOSPITAL_COMMUNITY): Payer: Medicare Other

## 2018-08-02 ENCOUNTER — Inpatient Hospital Stay (HOSPITAL_COMMUNITY)
Admission: EM | Admit: 2018-08-02 | Discharge: 2018-08-04 | DRG: 291 | Disposition: A | Discharge status: Home Health | Payer: Medicare Other | Attending: Internal Medicine | Admitting: Internal Medicine

## 2018-08-02 DIAGNOSIS — E876 Hypokalemia: Secondary | ICD-10-CM | POA: Diagnosis present

## 2018-08-02 DIAGNOSIS — I4891 Unspecified atrial fibrillation: Secondary | ICD-10-CM | POA: Diagnosis present

## 2018-08-02 DIAGNOSIS — Z8052 Family history of malignant neoplasm of bladder: Secondary | ICD-10-CM

## 2018-08-02 DIAGNOSIS — E785 Hyperlipidemia, unspecified: Secondary | ICD-10-CM | POA: Diagnosis present

## 2018-08-02 DIAGNOSIS — J441 Chronic obstructive pulmonary disease with (acute) exacerbation: Secondary | ICD-10-CM | POA: Diagnosis not present

## 2018-08-02 DIAGNOSIS — I110 Hypertensive heart disease with heart failure: Principal | ICD-10-CM | POA: Diagnosis present

## 2018-08-02 DIAGNOSIS — Z885 Allergy status to narcotic agent status: Secondary | ICD-10-CM | POA: Diagnosis not present

## 2018-08-02 DIAGNOSIS — Z818 Family history of other mental and behavioral disorders: Secondary | ICD-10-CM

## 2018-08-02 DIAGNOSIS — J9600 Acute respiratory failure, unspecified whether with hypoxia or hypercapnia: Secondary | ICD-10-CM | POA: Diagnosis not present

## 2018-08-02 DIAGNOSIS — I10 Essential (primary) hypertension: Secondary | ICD-10-CM | POA: Diagnosis not present

## 2018-08-02 DIAGNOSIS — Z9119 Patient's noncompliance with other medical treatment and regimen: Secondary | ICD-10-CM

## 2018-08-02 DIAGNOSIS — E039 Hypothyroidism, unspecified: Secondary | ICD-10-CM | POA: Diagnosis not present

## 2018-08-02 DIAGNOSIS — Z8059 Family history of malignant neoplasm of other urinary tract organ: Secondary | ICD-10-CM

## 2018-08-02 DIAGNOSIS — Z79890 Hormone replacement therapy (postmenopausal): Secondary | ICD-10-CM

## 2018-08-02 DIAGNOSIS — R0602 Shortness of breath: Secondary | ICD-10-CM | POA: Diagnosis not present

## 2018-08-02 DIAGNOSIS — Z8249 Family history of ischemic heart disease and other diseases of the circulatory system: Secondary | ICD-10-CM

## 2018-08-02 DIAGNOSIS — T380X5A Adverse effect of glucocorticoids and synthetic analogues, initial encounter: Secondary | ICD-10-CM | POA: Diagnosis not present

## 2018-08-02 DIAGNOSIS — I5033 Acute on chronic diastolic (congestive) heart failure: Secondary | ICD-10-CM | POA: Diagnosis present

## 2018-08-02 DIAGNOSIS — Z9049 Acquired absence of other specified parts of digestive tract: Secondary | ICD-10-CM | POA: Diagnosis not present

## 2018-08-02 DIAGNOSIS — D72829 Elevated white blood cell count, unspecified: Secondary | ICD-10-CM | POA: Diagnosis present

## 2018-08-02 DIAGNOSIS — Z79899 Other long term (current) drug therapy: Secondary | ICD-10-CM | POA: Diagnosis not present

## 2018-08-02 DIAGNOSIS — Z831 Family history of other infectious and parasitic diseases: Secondary | ICD-10-CM | POA: Diagnosis not present

## 2018-08-02 DIAGNOSIS — Z801 Family history of malignant neoplasm of trachea, bronchus and lung: Secondary | ICD-10-CM

## 2018-08-02 DIAGNOSIS — E119 Type 2 diabetes mellitus without complications: Secondary | ICD-10-CM | POA: Diagnosis not present

## 2018-08-02 DIAGNOSIS — Z7901 Long term (current) use of anticoagulants: Secondary | ICD-10-CM | POA: Diagnosis not present

## 2018-08-02 DIAGNOSIS — D539 Nutritional anemia, unspecified: Secondary | ICD-10-CM | POA: Diagnosis present

## 2018-08-02 DIAGNOSIS — I482 Chronic atrial fibrillation, unspecified: Secondary | ICD-10-CM | POA: Diagnosis present

## 2018-08-02 DIAGNOSIS — E662 Morbid (severe) obesity with alveolar hypoventilation: Secondary | ICD-10-CM | POA: Diagnosis not present

## 2018-08-02 DIAGNOSIS — Z6841 Body Mass Index (BMI) 40.0 and over, adult: Secondary | ICD-10-CM

## 2018-08-02 DIAGNOSIS — Z800 Family history of malignant neoplasm of digestive organs: Secondary | ICD-10-CM

## 2018-08-02 DIAGNOSIS — K5900 Constipation, unspecified: Secondary | ICD-10-CM | POA: Diagnosis not present

## 2018-08-02 DIAGNOSIS — I5032 Chronic diastolic (congestive) heart failure: Secondary | ICD-10-CM

## 2018-08-02 DIAGNOSIS — Z8051 Family history of malignant neoplasm of kidney: Secondary | ICD-10-CM

## 2018-08-02 DIAGNOSIS — Z808 Family history of malignant neoplasm of other organs or systems: Secondary | ICD-10-CM

## 2018-08-02 DIAGNOSIS — Z90710 Acquired absence of both cervix and uterus: Secondary | ICD-10-CM

## 2018-08-02 LAB — CBC
HCT: 38 % (ref 36.0–46.0)
Hemoglobin: 11.7 g/dL — ABNORMAL LOW (ref 12.0–15.0)
MCH: 31.5 pg (ref 26.0–34.0)
MCHC: 30.8 g/dL (ref 30.0–36.0)
MCV: 102.4 fL — ABNORMAL HIGH (ref 80.0–100.0)
Platelets: 244 10*3/uL (ref 150–400)
RBC: 3.71 MIL/uL — ABNORMAL LOW (ref 3.87–5.11)
RDW: 13.4 % (ref 11.5–15.5)
WBC: 15.4 10*3/uL — ABNORMAL HIGH (ref 4.0–10.5)
nRBC: 0 % (ref 0.0–0.2)

## 2018-08-02 LAB — BASIC METABOLIC PANEL
ANION GAP: 16 — AB (ref 5–15)
Anion gap: 16 — ABNORMAL HIGH (ref 5–15)
BUN: 28 mg/dL — ABNORMAL HIGH (ref 8–23)
BUN: 27 mg/dL — ABNORMAL HIGH (ref 8–23)
CO2: 22 mmol/L (ref 22–32)
CO2: 23 mmol/L (ref 22–32)
Calcium: 8.6 mg/dL — ABNORMAL LOW (ref 8.9–10.3)
Calcium: 8.7 mg/dL — ABNORMAL LOW (ref 8.9–10.3)
Chloride: 101 mmol/L (ref 98–111)
Chloride: 100 mmol/L (ref 98–111)
Creatinine, Ser: 1.12 mg/dL — ABNORMAL HIGH (ref 0.44–1.00)
Creatinine, Ser: 1.08 mg/dL — ABNORMAL HIGH (ref 0.44–1.00)
GFR calc Af Amer: 52 mL/min — ABNORMAL LOW (ref >60)
GFR calc Af Amer: 55 mL/min — ABNORMAL LOW (ref >60)
GFR calc non Af Amer: 45 mL/min — ABNORMAL LOW (ref >60)
GFR, EST NON AFRICAN AMERICAN: 47 mL/min — AB (ref >60)
GLUCOSE: 212 mg/dL — AB (ref 70–99)
Glucose, Bld: 147 mg/dL — ABNORMAL HIGH (ref 70–99)
Potassium: 3.3 mmol/L — ABNORMAL LOW (ref 3.5–5.1)
Potassium: 3.2 mmol/L — ABNORMAL LOW (ref 3.5–5.1)
Sodium: 139 mmol/L (ref 135–145)
Sodium: 139 mmol/L (ref 135–145)

## 2018-08-02 LAB — CBC WITH DIFFERENTIAL/PLATELET
Abs Immature Granulocytes: 0.26 10*3/uL — ABNORMAL HIGH (ref 0.00–0.07)
Basophils Absolute: 0 10*3/uL (ref 0.0–0.1)
Basophils Relative: 0 %
Eosinophils Absolute: 0 10*3/uL (ref 0.0–0.5)
Eosinophils Relative: 0 %
HCT: 36.5 % (ref 36.0–46.0)
Hemoglobin: 11.4 g/dL — ABNORMAL LOW (ref 12.0–15.0)
IMMATURE GRANULOCYTES: 2 %
Lymphocytes Relative: 9 %
Lymphs Abs: 1.5 10*3/uL (ref 0.7–4.0)
MCH: 31.4 pg (ref 26.0–34.0)
MCHC: 31.2 g/dL (ref 30.0–36.0)
MCV: 100.6 fL — ABNORMAL HIGH (ref 80.0–100.0)
Monocytes Absolute: 0.8 10*3/uL (ref 0.1–1.0)
Monocytes Relative: 5 %
Neutro Abs: 13.2 10*3/uL — ABNORMAL HIGH (ref 1.7–7.7)
Neutrophils Relative %: 84 %
Platelets: 248 10*3/uL (ref 150–400)
RBC: 3.63 MIL/uL — ABNORMAL LOW (ref 3.87–5.11)
RDW: 13.3 % (ref 11.5–15.5)
WBC: 15.8 10*3/uL — ABNORMAL HIGH (ref 4.0–10.5)
nRBC: 0 % (ref 0.0–0.2)

## 2018-08-02 LAB — BRAIN NATRIURETIC PEPTIDE: B Natriuretic Peptide: 323.7 pg/mL — ABNORMAL HIGH (ref 0.0–100.0)

## 2018-08-02 LAB — I-STAT TROPONIN, ED: Troponin i, poc: 0.01 ng/mL (ref 0.00–0.08)

## 2018-08-02 LAB — TSH: TSH: 0.764 u[IU]/mL (ref 0.350–4.500)

## 2018-08-02 LAB — TROPONIN I: Troponin I: <0.03 ng/mL (ref <0.03)

## 2018-08-02 MED ORDER — METOPROLOL SUCCINATE ER 50 MG PO TB24
50.0000 mg | ORAL_TABLET | Freq: Every day | ORAL | Status: DC
  Administered 2018-08-02 – 2018-08-04 (×3): 50 mg via ORAL
  Filled 2018-08-02 (×3): qty 1

## 2018-08-02 MED ORDER — APIXABAN 5 MG PO TABS
5.0000 mg | ORAL_TABLET | Freq: Two times a day (BID) | ORAL | Status: DC
  Administered 2018-08-02 – 2018-08-04 (×5): 5 mg via ORAL
  Filled 2018-08-02 (×5): qty 1

## 2018-08-02 MED ORDER — FUROSEMIDE 10 MG/ML IJ SOLN
20.0000 mg | Freq: Two times a day (BID) | INTRAMUSCULAR | Status: DC
  Administered 2018-08-02 – 2018-08-04 (×5): 20 mg via INTRAVENOUS
  Filled 2018-08-02 (×5): qty 2

## 2018-08-02 MED ORDER — METHYLPREDNISOLONE SODIUM SUCC 125 MG IJ SOLR
125.0000 mg | Freq: Once | INTRAMUSCULAR | Status: AC
  Administered 2018-08-02: 125 mg via INTRAVENOUS
  Filled 2018-08-02: qty 2

## 2018-08-02 MED ORDER — ACETAMINOPHEN 650 MG RE SUPP: 650.0000 mg | Freq: Four times a day (QID) | RECTAL | Status: DC | PRN

## 2018-08-02 MED ORDER — ONDANSETRON HCL 4 MG/2ML IJ SOLN: 4.0000 mg | Freq: Four times a day (QID) | INTRAMUSCULAR | Status: DC | PRN

## 2018-08-02 MED ORDER — ATORVASTATIN CALCIUM 10 MG PO TABS
20.0000 mg | ORAL_TABLET | Freq: Every evening | ORAL | Status: DC
  Administered 2018-08-02 – 2018-08-03 (×2): 20 mg via ORAL
  Filled 2018-08-02 (×2): qty 2
  Filled 2018-08-02: qty 1

## 2018-08-02 MED ORDER — ACETAMINOPHEN 325 MG PO TABS: 650.0000 mg | ORAL_TABLET | Freq: Four times a day (QID) | ORAL | Status: DC | PRN

## 2018-08-02 MED ORDER — LEVALBUTEROL HCL 0.63 MG/3ML IN NEBU
0.6300 mg | INHALATION_SOLUTION | Freq: Four times a day (QID) | RESPIRATORY_TRACT | Status: DC
  Administered 2018-08-02 (×4): 0.63 mg via RESPIRATORY_TRACT
  Filled 2018-08-02 (×5): qty 3

## 2018-08-02 MED ORDER — DILTIAZEM HCL 60 MG PO TABS
120.0000 mg | ORAL_TABLET | Freq: Once | ORAL | Status: AC
  Administered 2018-08-02: 120 mg via ORAL
  Filled 2018-08-02: qty 2

## 2018-08-02 MED ORDER — METHYLPREDNISOLONE SODIUM SUCC 40 MG IJ SOLR
40.0000 mg | Freq: Two times a day (BID) | INTRAMUSCULAR | Status: DC
  Administered 2018-08-02 – 2018-08-04 (×4): 40 mg via INTRAVENOUS
  Filled 2018-08-02 (×4): qty 1

## 2018-08-02 MED ORDER — IPRATROPIUM BROMIDE 0.02 % IN SOLN
1.0000 mg | Freq: Four times a day (QID) | RESPIRATORY_TRACT | Status: DC
  Administered 2018-08-02: 1 mg via RESPIRATORY_TRACT
  Filled 2018-08-02: qty 5

## 2018-08-02 MED ORDER — TRAMADOL HCL 50 MG PO TABS: 50.0000 mg | ORAL_TABLET | Freq: Four times a day (QID) | ORAL | Status: DC | PRN

## 2018-08-02 MED ORDER — IPRATROPIUM BROMIDE 0.02 % IN SOLN
1.0000 mg | Freq: Once | RESPIRATORY_TRACT | Status: AC
  Administered 2018-08-02: 1 mg via RESPIRATORY_TRACT
  Filled 2018-08-02: qty 5

## 2018-08-02 MED ORDER — LEVALBUTEROL HCL 0.63 MG/3ML IN NEBU: 0.6300 mg | INHALATION_SOLUTION | Freq: Four times a day (QID) | RESPIRATORY_TRACT | Status: DC | PRN

## 2018-08-02 MED ORDER — BUDESONIDE 0.25 MG/2ML IN SUSP
0.2500 mg | Freq: Two times a day (BID) | RESPIRATORY_TRACT | Status: DC
  Administered 2018-08-02 – 2018-08-04 (×5): 0.25 mg via RESPIRATORY_TRACT
  Filled 2018-08-02 (×8): qty 2

## 2018-08-02 MED ORDER — DILTIAZEM HCL ER COATED BEADS 180 MG PO CP24
180.0000 mg | ORAL_CAPSULE | Freq: Every day | ORAL | Status: DC
  Administered 2018-08-02 – 2018-08-04 (×3): 180 mg via ORAL
  Filled 2018-08-02 (×3): qty 1

## 2018-08-02 MED ORDER — POTASSIUM CHLORIDE CRYS ER 20 MEQ PO TBCR
40.0000 meq | EXTENDED_RELEASE_TABLET | Freq: Once | ORAL | Status: AC
  Administered 2018-08-02: 40 meq via ORAL
  Filled 2018-08-02: qty 2

## 2018-08-02 MED ORDER — MAGNESIUM SULFATE 2 GM/50ML IV SOLN
2.0000 g | Freq: Once | INTRAVENOUS | Status: AC
  Administered 2018-08-02: 2 g via INTRAVENOUS
  Filled 2018-08-02: qty 50

## 2018-08-02 MED ORDER — LEVALBUTEROL HCL 0.63 MG/3ML IN NEBU
0.6300 mg | INHALATION_SOLUTION | Freq: Three times a day (TID) | RESPIRATORY_TRACT | Status: DC
  Administered 2018-08-03: 0.63 mg via RESPIRATORY_TRACT
  Filled 2018-08-02 (×2): qty 3

## 2018-08-02 MED ORDER — UMECLIDINIUM BROMIDE 62.5 MCG/INH IN AEPB
1.0000 | INHALATION_SPRAY | Freq: Every day | RESPIRATORY_TRACT | Status: DC
  Filled 2018-08-02: qty 7

## 2018-08-02 MED ORDER — IRBESARTAN 150 MG PO TABS
75.0000 mg | ORAL_TABLET | Freq: Every day | ORAL | Status: DC
  Administered 2018-08-02 – 2018-08-04 (×3): 75 mg via ORAL
  Filled 2018-08-02 (×3): qty 1

## 2018-08-02 MED ORDER — LEVALBUTEROL HCL 0.63 MG/3ML IN NEBU: 0.6300 mg | INHALATION_SOLUTION | RESPIRATORY_TRACT | Status: DC | PRN

## 2018-08-02 MED ORDER — TIOTROPIUM BROMIDE MONOHYDRATE 18 MCG IN CAPS: 18.0000 ug | ORAL_CAPSULE | Freq: Every day | RESPIRATORY_TRACT | Status: DC

## 2018-08-02 MED ORDER — DOXYCYCLINE HYCLATE 100 MG PO TABS: 100.0000 mg | ORAL_TABLET | Freq: Two times a day (BID) | ORAL | Status: DC

## 2018-08-02 MED ORDER — DILTIAZEM HCL-DEXTROSE 100-5 MG/100ML-% IV SOLN (PREMIX)
5.0000 mg/h | INTRAVENOUS | Status: DC
  Administered 2018-08-02: 5 mg/h via INTRAVENOUS
  Filled 2018-08-02: qty 100

## 2018-08-02 MED ORDER — ONDANSETRON HCL 4 MG PO TABS: 4.0000 mg | ORAL_TABLET | Freq: Four times a day (QID) | ORAL | Status: DC | PRN

## 2018-08-02 MED ORDER — POTASSIUM CHLORIDE CRYS ER 20 MEQ PO TBCR
20.0000 meq | EXTENDED_RELEASE_TABLET | Freq: Every day | ORAL | Status: DC
  Administered 2018-08-02: 20 meq via ORAL
  Filled 2018-08-02 (×2): qty 1

## 2018-08-02 MED ORDER — IPRATROPIUM BROMIDE 0.02 % IN SOLN
1.0000 mg | Freq: Three times a day (TID) | RESPIRATORY_TRACT | Status: DC
  Administered 2018-08-03 – 2018-08-04 (×3): 1 mg via RESPIRATORY_TRACT
  Filled 2018-08-02 (×5): qty 5

## 2018-08-02 MED ORDER — LEVOTHYROXINE SODIUM 100 MCG PO TABS
100.0000 ug | ORAL_TABLET | Freq: Every day | ORAL | Status: DC
  Administered 2018-08-02 – 2018-08-04 (×3): 100 ug via ORAL
  Filled 2018-08-02 (×3): qty 1

## 2018-08-02 MED ORDER — SODIUM CHLORIDE 0.9 % IV SOLN
100.0000 mg | Freq: Two times a day (BID) | INTRAVENOUS | Status: DC
  Administered 2018-08-02: 100 mg via INTRAVENOUS
  Filled 2018-08-02 (×2): qty 100

## 2018-08-02 MED ORDER — DOXYCYCLINE HYCLATE 100 MG PO TABS
100.0000 mg | ORAL_TABLET | Freq: Two times a day (BID) | ORAL | Status: DC
  Administered 2018-08-02 – 2018-08-04 (×5): 100 mg via ORAL
  Filled 2018-08-02 (×5): qty 1

## 2018-08-02 MED ORDER — ALBUTEROL (5 MG/ML) CONTINUOUS INHALATION SOLN
15.0000 mg/h | INHALATION_SOLUTION | Freq: Once | RESPIRATORY_TRACT | Status: AC
  Administered 2018-08-02: 15 mg/h via RESPIRATORY_TRACT
  Filled 2018-08-02: qty 20

## 2018-08-02 NOTE — ED Notes (Signed)
Pt reports she is feeling anxious- RN provided reassurance and repositioned her.

## 2018-08-02 NOTE — ED Notes (Signed)
PEr discussion with Dr. Curt Bears- pt will need another dose of 120mg  of dilt to ween off drip. RN placed order and called pharmacy to verify.

## 2018-08-02 NOTE — ED Notes (Signed)
SDU 

## 2018-08-02 NOTE — H&P (Signed)
History and Physical    [name] Case KGS:[id number] DOB: 10/26/1931 DOA: 08/02/2018  PCP: Hoyt Koch, MD  Patient coming from: Home.  Chief Complaint: Shortness of breath.  HPI: [name] Case is a 84 y.o. female with history of COPD, diastolic CHF last EF measured was 50 to 55% in 2017, atrial fibrillation, sleep apnea, hypertension, hypothyroidism was recently admitted for COPD exacerbation and discharged 3 days ago presents to the ER because of worsening shortness of breath which started off yesterday morning.  Patient has been continuously wheezing and also had increasing lower extremity edema.  Patient's Lasix was recently discontinued due to increasing creatinine.  On arrival patient did not have any chest pain.  Has been having productive cough yellowish in color.  ED Course: Chest x-ray was unremarkable.  On exam patient was wheezing and has had bilateral lower extremity edema.  After giving nebulizer treatment patient went into A. fib with RVR for which patient is started on Cardizem infusion.  Subsequent which patient started having chest pressure.  Patient is admitted for acute respiratory failure likely a combination of CHF COPD with A. fib with RVR.  Review of Systems: As per HPI, rest all negative.   Past Medical History:  Diagnosis Date  . Allergy   . Arthritis   . Atrial fibrillation ([place])   . Bronchitis   . Cancer ([place])    Bilateral upper tract transitional cell carcinoma  . Cardiomegaly   . CHF (congestive heart failure) ([place])   . Complication of anesthesia    Difficult to arouse  . COPD (chronic obstructive pulmonary disease) (HCC)    borderline   pt. denies at preop  . Diabetes mellitus without complication ([place])    Controlled by diet and exercise  . Diverticulosis of sigmoid colon   . Dyspnea    at times  . Dysrhythmia    a fib  . Family history of bladder cancer   . Family history of colon cancer   . Family history of kidney cancer   .  Fatty liver   . GERD (gastroesophageal reflux disease)   . History of hiatal hernia 01/20/2015   Small sliding, noted on CT  . History of kidney stones   . Hyperlipidemia   . Hypertension   . Hypothyroidism   . Lynch syndrome   . PONV (postoperative nausea and vomiting)   . Supraumbilical hernia 58/59/2924   Small noted on CT  . Thyroid disease   . UTI (urinary tract infection)     Past Surgical History:  Procedure Laterality Date  . ABDOMINAL HYSTERECTOMY    . APPENDECTOMY    . CHOLECYSTECTOMY    . COLONOSCOPY  07/16/2009  . CYSTOSCOPY W/ URETERAL STENT PLACEMENT Right 12/04/2017   Procedure: CYSTOSCOPY WITH RETROGRADE PYELOGRAM/URETERAL STENT PLACEMENT;  Surgeon: Festus Aloe, MD;  Location: [facility];  Service: Urology;  Laterality: Right;  . CYSTOSCOPY W/ URETERAL STENT PLACEMENT Right 02/26/2018   Procedure: CYSTOSCOPY WITH RETROGRADE PYELOGRAM/URETERAL STENT PLACEMENT;  Surgeon: Kathie Rhodes, MD;  Location: [facility];  Service: Urology;  Laterality: Right;  . CYSTOSCOPY WITH URETEROSCOPY AND STENT PLACEMENT Bilateral 01/29/2018   Procedure: BILATERAL URETEROSCOPY AND TUMOR RESECTION  WITH LASER RIGHT STENT EXCHANGE LEFT STENT PLACEMENT;  Surgeon: Ardis Hughs, MD;  Location: [facility];  Service: Urology;  Laterality: Bilateral;  . CYSTOSCOPY WITH URETEROSCOPY AND STENT PLACEMENT Bilateral 02/10/2018   Procedure: BILATERAL URETEROSCOPY AND TUMOR EXCISION BILATERAL STENT EXCHANGE;  Surgeon: Ardis Hughs, MD;  Location: [facility];  Service: Urology;  Laterality: Bilateral;  . CYSTOSCOPY/RETROGRADE/URETEROSCOPY Bilateral 12/24/2017   Procedure: BILATERAL URETEROSCOPY, RIGHT STENT EXCHANGE, STONE EXTRACTION WITH BASKET BILATERAL RETROGRADE PYELOGRAM, BIOPSY OF RIGHT UPPER POLE TUMOR,  BILATERAL RENAL PELVIS FLUID SENT FOR CYTOLOGY;  Surgeon: Ardis Hughs, MD;  Location: [facility];  Service: Urology;  Laterality: Bilateral;  . CYSTOSCOPY/URETEROSCOPY/HOLMIUM LASER/STENT  PLACEMENT Bilateral 06/03/2018   Procedure: CYSTOSCOPY/BILATERAL URETEROSCOPY/ LASER TUMOR ABLATION BILATERAL / BILATERAL STENT PLACEMENT/BILATERAL RETROGRADE PYELOGRAM/BLADDER BIOPSY WITH FULGURATION;  Surgeon: Ardis Hughs, MD;  Location: [facility];  Service: Urology;  Laterality: Bilateral;  . HOLMIUM LASER APPLICATION Bilateral 73/12/3297   Procedure: HOLMIUM LASER APPLICATION;  Surgeon: Ardis Hughs, MD;  Location: [facility];  Service: Urology;  Laterality: Bilateral;  . KNEE SURGERY Right   . KNEE SURGERY Left    torn ligament repair  . UPPER GI ENDOSCOPY    . ureteroscopy laser tumor ablation bilateral stent placement     Dr. Louis Meckel 06-03-18      reports that she has never smoked. She has never used smokeless tobacco. She reports that she does not drink alcohol or use drugs.  Allergies  Allergen Reactions  . Codeine Hives and Rash    In cough syrup preparations    Family History  Problem Relation Age of Onset  . Heart disease Mother   . Colon cancer Mother 45       d. 27  . Tuberculosis Father   . Bladder Cancer Brother        urothelial cancer  . Bladder Cancer Brother        ureter cancer  . Kidney cancer Brother   . Skin cancer Sister   . Cancer Maternal Aunt        NOS  . Colon cancer Other        dx 1st time under 89, second time at 42  . Lung cancer Other   . Muscular dystrophy Other        d. 36  . Testicular cancer Other 24       great nephew  . Alcohol abuse Daughter   . Drug abuse Daughter   . Schizophrenia Daughter   . Colon cancer Grandchild     Prior to Admission medications   Medication Sig Start Date End Date Taking? Authorizing Provider  acetaminophen (TYLENOL) 500 MG tablet Take 500-1,000 mg by mouth every 8 (eight) hours as needed for moderate pain or headache.    Yes [provider]  albuterol (PROVENTIL) (2.5 MG/3ML) 0.083% nebulizer solution Take 3 mLs (2.5 mg total) by nebulization every 6 (six) hours as needed for wheezing  or shortness of breath. 07/31/18  Yes Gherghe, Vella Redhead, MD  apixaban (ELIQUIS) 5 MG TABS tablet Take 1 tablet (5 mg total) by mouth 2 (two) times daily. Resume once the bleeding has stopped from the surgery for 2 days. Patient taking differently: Take 5 mg by mouth 2 (two) times daily.  02/10/18  Yes Ardis Hughs, MD  atorvastatin (LIPITOR) 20 MG tablet TAKE 1 TABLET BY MOUTH DAILY AT 6 PM Patient taking differently: Take 20 mg by mouth every evening.  01/26/18  Yes Hoyt Koch, MD  diltiazem (CARDIZEM CD) 180 MG 24 hr capsule TAKE 1 CAPSULE BY MOUTH DAILY Patient taking differently: Take 180 mg by mouth daily.  04/23/18  Yes Hoyt Koch, MD  doxycycline (VIBRA-TABS) 100 MG tablet Take 1 tablet (100 mg total) by mouth every 12 (twelve) hours.  07/31/18  Yes Caren Griffins, MD  furosemide (LASIX) 80 MG tablet Take 0.5 tablets (40 mg total) by mouth daily as needed for fluid or edema. 07/31/18  Yes Gherghe, Vella Redhead, MD  irbesartan (AVAPRO) 150 MG tablet Take 0.5 tablets (75 mg total) by mouth daily. 07/31/18  Yes Gherghe, Vella Redhead, MD  levothyroxine (SYNTHROID, LEVOTHROID) 100 MCG tablet Take 1 tablet (100 mcg total) by mouth daily. Patient taking differently: Take 100 mcg by mouth daily before breakfast.  03/15/18  Yes Hoyt Koch, MD  metoprolol succinate (TOPROL-XL) 50 MG 24 hr tablet TAKE 1 TABLET BY MOUTH DAILY WITH OR IMMEDIATELY FOLLOWING A MEAL Patient taking differently: Take 50 mg by mouth daily.  02/11/18  Yes Jerline Pain, MD  predniSONE (DELTASONE) 10 MG tablet Take 4 tablets (40 mg total) by mouth daily. 4 tablets daily x 2 days then 3 daily x 2 days then 2 daily x 2 days then 1 daily x 2 days (20) Patient taking differently: Take 10-40 mg by mouth See admin instructions. 4 tablets daily x 2 days then 3 daily x 2 days then 2 daily x 2 days then 1 daily x 2 days (20) 07/31/18  Yes Gherghe, Vella Redhead, MD  tiotropium (SPIRIVA HANDIHALER) 18 MCG inhalation  capsule Place 1 capsule (18 mcg total) into inhaler and inhale daily. 07/31/18 07/31/19 Yes Gherghe, Vella Redhead, MD  traMADol (ULTRAM) 50 MG tablet Take 1-2 tablets (50-100 mg total) by mouth every 6 (six) hours as needed for moderate pain. 06/03/18  Yes Ardis Hughs, MD    Physical Exam: Vitals:   08/02/18 0237 08/02/18 0240 08/02/18 0245 08/02/18 0309  BP:   (!) 142/78   Pulse: (S) 96  (!) 50   Resp: (!) 24  18   Temp: 98.6 F (37 C)     TempSrc: Oral     SpO2: 99%  99% 96%  Weight:  (S) 98 kg    Height:  5' (1.524 m)        Constitutional: Moderately built and nourished. Vitals:   08/02/18 0237 08/02/18 0240 08/02/18 0245 08/02/18 0309  BP:   (!) 142/78   Pulse: (S) 96  (!) 50   Resp: (!) 24  18   Temp: 98.6 F (37 C)     TempSrc: Oral     SpO2: 99%  99% 96%  Weight:  (S) 98 kg    Height:  5' (1.524 m)     Eyes: Anicteric no pallor. ENMT: No discharge from the ears eyes nose or mouth. Neck: Mild JVD elevated. Respiratory: Bilateral expiratory wheeze no crepitations. Cardiovascular: S1-S2 heard tachycardic. Abdomen: Soft nontender bowel sounds present. Musculoskeletal: Bilateral lower extremity edema present. Skin: No rash. Neurologic: Alert awake oriented to time place and person.  Moves all extremities. Psychiatric: Appears normal.  Normal affect.   Labs on Admission: I have personally reviewed following labs and imaging studies  CBC: Recent Labs  Lab 07/29/18 1724 08/02/18 0247  WBC 7.1 15.8*  NEUTROABS 4.6 13.2*  HGB 12.3 11.4*  HCT 37.9 36.5  MCV 98.4 100.6*  PLT 234 240   Basic Metabolic Panel: Recent Labs  Lab 07/29/18 1724 07/30/18 0039 07/30/18 1001 07/31/18 0208 08/02/18 0247  NA 141  --  138 139 139  K 2.7*  --  3.3* 3.8 3.3*  CL 99  --  104 102 101  CO2 32  --  23 27 22   GLUCOSE 146*  --  366* 254*  147*  BUN 13  --  13 21 28*  CREATININE 0.94  --  1.18* 1.06* 1.12*  CALCIUM 8.7*  --  8.6* 8.6* 8.6*  MG  --  1.7  --   --   --     GFR: Estimated Creatinine Clearance: 39.3 mL/min (A) (by C-G formula based on SCr of 1.12 mg/dL (H)). Liver Function Tests: Recent Labs  Lab 07/29/18 1724  AST 26  ALT 19  ALKPHOS 67  BILITOT 0.5  PROT 6.0*  ALBUMIN 3.3*   No results for input(s): LIPASE, AMYLASE in the last 168 hours. No results for input(s): AMMONIA in the last 168 hours. Coagulation Profile: No results for input(s): INR, PROTIME in the last 168 hours. Cardiac Enzymes: No results for input(s): CKTOTAL, CKMB, CKMBINDEX, TROPONINI in the last 168 hours. BNP (last 3 results) No results for input(s): PROBNP in the last 8760 hours. HbA1C: Recent Labs    07/30/18 1001  HGBA1C 5.9*   CBG: Recent Labs  Lab 07/30/18 0829 07/30/18 1649 07/30/18 2148 07/31/18 0710 07/31/18 1126  GLUCAP 279* 252* 251* 245* 242*   Lipid Profile: No results for input(s): CHOL, HDL, LDLCALC, TRIG, CHOLHDL, LDLDIRECT in the last 72 hours. Thyroid Function Tests: No results for input(s): TSH, T4TOTAL, FREET4, T3FREE, THYROIDAB in the last 72 hours. Anemia Panel: No results for input(s): VITAMINB12, FOLATE, FERRITIN, TIBC, IRON, RETICCTPCT in the last 72 hours. Urine analysis:    Component Value Date/Time   COLORURINE YELLOW 06/12/2018 0608   APPEARANCEUR CLEAR 06/12/2018 0608   LABSPEC 1.008 06/12/2018 0608   PHURINE 9.0 (H) 06/12/2018 0608   GLUCOSEU NEGATIVE 06/12/2018 0608   HGBUR LARGE (A) 06/12/2018 0608   BILIRUBINUR NEGATIVE 06/12/2018 0608   BILIRUBINUR n 07/01/2012 1115   KETONESUR NEGATIVE 06/12/2018 0608   PROTEINUR NEGATIVE 06/12/2018 0608   UROBILINOGEN 0.2 01/20/2015 1153   NITRITE NEGATIVE 06/12/2018 0608   LEUKOCYTESUR LARGE (A) 06/12/2018 0608   Sepsis Labs: @LABRCNTIP (procalcitonin:4,lacticidven:4) )No results found for this or any previous visit (from the past 240 hour(s)).   Radiological Exams on Admission: Dg Chest Portable 1 View  Result Date: 08/02/2018 CLINICAL DATA:  Shortness of  breath, lower extremity swelling. EXAM: PORTABLE CHEST 1 VIEW COMPARISON:  07/29/2018 FINDINGS: Cardiomegaly. No confluent opacities, effusions or edema. No acute bony abnormality. IMPRESSION: Cardiomegaly.  No active disease. Electronically Signed   By: Rolm Baptise M.D.   On: 08/02/2018 02:56    EKG: Independently reviewed.  A. fib with RVR.  Assessment/Plan Principal Problem:   Atrial fibrillation with RVR (HCC) Active Problems:   Hypothyroidism   Essential hypertension   Type 2 diabetes mellitus without complication (HCC)   Chronic CHF (congestive heart failure) (HCC)   Obesity hypoventilation syndrome (HCC)   COPD with acute exacerbation ([place])    1. A. fib with RVR -patient's heart rate went up after getting nebulizer treatment.  DC albuterol and keep patient on Xopenex.  Patient has been started on Cardizem infusion.  Will try to wean off Cardizem infusion after patient receives oral Cardizem and metoprolol.  Chads 2 vasc score of 6 on apixaban.  Check TSH and also cycle cardiac markers since patient has been having some chest pain. 2. Acute respiratory failure secondary to CHF exacerbation and COPD exacerbation on Xopenex which has been changed from albuterol due to A. fib with RVR.  On Pulmicort doxycycline and IV steroids.  I have also placed patient on Lasix 20 mg IV every 12 closely follow metabolic panel intake  output and daily weights.  Last 2D echo done in 2017 was showing EF of 50 to 55%. 3. Hypertension on ARB metoprolol Cardizem. 4. Hypothyroidism on Synthroid check TSH. 5. Anemia follow CBC. 6. Possible chronic kidney disease stage III follow metabolic panel.  If creatinine worsens may have to hold ARB. 7. Sleep apnea noncompliant with CPAP.   DVT prophylaxis: Apixaban. Code Status: Full code. Family Communication: Patient's family at the bedside. Disposition Plan: Home. Consults called: None. Admission status: Observation.   Rise Patience MD [facility] Pager [phone].  If 7PM-7AM, please contact night-coverage [email or web address] Password [facility]  08/02/2018, 4:26 AM

## 2018-08-02 NOTE — Progress Notes (Signed)
Patient received from ED, A&Ox4, VSS. Telemetry applied and CCMD notified. Oriented to room and call light within reach.

## 2018-08-02 NOTE — ED Notes (Signed)
Respiratory therapy bedside

## 2018-08-02 NOTE — ED Notes (Signed)
Ordered breakfast tray  

## 2018-08-02 NOTE — ED Provider Notes (Signed)
TIME SEEN: 2:43 AM  CHIEF COMPLAINT: Shortness of breath, wheezing, leg swelling  HPI: Patient is an 84 y.o.femalewith medical history significant ofCOPD,dCHF, hypertension, hyperlipidemia, hypothyroidism, atrial fibrillation on Eliquis, OSA not on CPAP, urothelial cancer (s/p ofsurgery, no chemo or radiation therapy) who presents to the emergency department with increasing shortness of breath since recent discharge from the hospital on 07/31/2018 for COPD exacerbation.  States she has noticed that she has had increased peripheral edema in her lower extremities.  No chest pain or chest discomfort.  Occasionally has a productive cough with yellow sputum production.  No fever.  After her last admission she was discharged home on 2 L of oxygen.  She has never been a smoker.  Has been using nebulizer treatments at home without relief.  ROS: See HPI Constitutional: no fever  Eyes: no drainage  ENT: no runny nose   Cardiovascular:  no chest pain  Resp: SOB  GI: no vomiting GU: no dysuria Integumentary: no rash  Allergy: no hives  Musculoskeletal: no leg swelling  Neurological: no slurred speech ROS otherwise negative  PAST MEDICAL HISTORY/PAST SURGICAL HISTORY:  Past Medical History:  Diagnosis Date  . Allergy   . Arthritis   . Atrial fibrillation ([place])   . Bronchitis   . Cancer ([place])    Bilateral upper tract transitional cell carcinoma  . Cardiomegaly   . CHF (congestive heart failure) ([place])   . Complication of anesthesia    Difficult to arouse  . COPD (chronic obstructive pulmonary disease) (HCC)    borderline   pt. denies at preop  . Diabetes mellitus without complication ([place])    Controlled by diet and exercise  . Diverticulosis of sigmoid colon   . Dyspnea    at times  . Dysrhythmia    a fib  . Family history of bladder cancer   . Family history of colon cancer   . Family history of kidney cancer   . Fatty liver   . GERD (gastroesophageal reflux disease)   . History  of hiatal hernia 01/20/2015   Small sliding, noted on CT  . History of kidney stones   . Hyperlipidemia   . Hypertension   . Hypothyroidism   . Lynch syndrome   . PONV (postoperative nausea and vomiting)   . Supraumbilical hernia 95/18/8416   Small noted on CT  . Thyroid disease   . UTI (urinary tract infection)     MEDICATIONS:  Prior to Admission medications   Medication Sig Start Date End Date Taking? Authorizing Provider  acetaminophen (TYLENOL) 500 MG tablet Take 500-1,000 mg by mouth every 8 (eight) hours as needed for moderate pain or headache.     [provider]  albuterol (PROVENTIL) (2.5 MG/3ML) 0.083% nebulizer solution Take 3 mLs (2.5 mg total) by nebulization every 6 (six) hours as needed for wheezing or shortness of breath. 07/31/18   Caren Griffins, MD  apixaban (ELIQUIS) 5 MG TABS tablet Take 1 tablet (5 mg total) by mouth 2 (two) times daily. Resume once the bleeding has stopped from the surgery for 2 days. Patient taking differently: Take 5 mg by mouth 2 (two) times daily.  02/10/18   Ardis Hughs, MD  atorvastatin (LIPITOR) 20 MG tablet TAKE 1 TABLET BY MOUTH DAILY AT 6 PM Patient taking differently: Take 20 mg by mouth every evening.  01/26/18   Hoyt Koch, MD  diltiazem (CARDIZEM CD) 180 MG 24 hr capsule TAKE 1 CAPSULE BY MOUTH DAILY Patient taking  differently: Take 180 mg by mouth daily.  04/23/18   Hoyt Koch, MD  doxycycline (VIBRA-TABS) 100 MG tablet Take 1 tablet (100 mg total) by mouth every 12 (twelve) hours. 07/31/18   Caren Griffins, MD  furosemide (LASIX) 80 MG tablet Take 0.5 tablets (40 mg total) by mouth daily as needed for fluid or edema. 07/31/18   Caren Griffins, MD  irbesartan (AVAPRO) 150 MG tablet Take 0.5 tablets (75 mg total) by mouth daily. 07/31/18   Caren Griffins, MD  levothyroxine (SYNTHROID, LEVOTHROID) 100 MCG tablet Take 1 tablet (100 mcg total) by mouth daily. Patient taking differently:  Take 100 mcg by mouth daily before breakfast.  03/15/18   Hoyt Koch, MD  metoprolol succinate (TOPROL-XL) 50 MG 24 hr tablet TAKE 1 TABLET BY MOUTH DAILY WITH OR IMMEDIATELY FOLLOWING A MEAL Patient taking differently: Take 50 mg by mouth daily.  02/11/18   Jerline Pain, MD  predniSONE (DELTASONE) 10 MG tablet Take 4 tablets (40 mg total) by mouth daily. 4 tablets daily x 2 days then 3 daily x 2 days then 2 daily x 2 days then 1 daily x 2 days (20) 07/31/18   Gherghe, Vella Redhead, MD  tiotropium (SPIRIVA HANDIHALER) 18 MCG inhalation capsule Place 1 capsule (18 mcg total) into inhaler and inhale daily. 07/31/18 07/31/19  Caren Griffins, MD  traMADol (ULTRAM) 50 MG tablet Take 1-2 tablets (50-100 mg total) by mouth every 6 (six) hours as needed for moderate pain. 06/03/18   Ardis Hughs, MD    ALLERGIES:  Allergies  Allergen Reactions  . Codeine Hives and Rash    In cough syrup preparations    SOCIAL HISTORY:  Social History   Tobacco Use  . Smoking status: Never Smoker  . Smokeless tobacco: Never Used  Substance Use Topics  . Alcohol use: No    Alcohol/week: 0.0 standard drinks    FAMILY HISTORY: Family History  Problem Relation Age of Onset  . Heart disease Mother   . Colon cancer Mother 61       d. 34  . Tuberculosis Father   . Bladder Cancer Brother        urothelial cancer  . Bladder Cancer Brother        ureter cancer  . Kidney cancer Brother   . Skin cancer Sister   . Cancer Maternal Aunt        NOS  . Colon cancer Other        dx 1st time under 55, second time at 67  . Lung cancer Other   . Muscular dystrophy Other        d. 41  . Testicular cancer Other 24       great nephew  . Alcohol abuse Daughter   . Drug abuse Daughter   . Schizophrenia Daughter   . Colon cancer Grandchild     EXAM: Pulse (S) 96 Comment: afib (90-115)  Temp 98.6 F (37 C) (Oral)   Resp (!) 24   Ht 5' (1.524 m)   Wt (S) 98 kg Comment: previous weight  LMP   (LMP Unknown)   SpO2 99%   BMI 42.18 kg/m  CONSTITUTIONAL: Alert and oriented and responds appropriately to questions.  Elderly, chronically ill-appearing HEAD: Normocephalic EYES: Conjunctivae clear, pupils appear equal, EOMI ENT: normal nose; moist mucous membranes NECK: Supple, no meningismus, no nuchal rigidity, no LAD  CARD: Irregularly irregular but rate controlled; S1 and S2 appreciated; no  murmurs, no clicks, no rubs, no gallops RESP: Normal chest excursion without splinting or tachypnea; patient has an story and expiratory wheezing on exam but no rhonchi or rales, slightly diminished at her bases bilaterally, no hypoxia or respiratory distress, speaking full sentences ABD/GI: Normal bowel sounds; non-distended; soft, non-tender, no rebound, no guarding, no peritoneal signs, no hepatosplenomegaly BACK:  The back appears normal and is non-tender to palpation, there is no CVA tenderness EXT: Normal ROM in all joints; non-tender to palpation; +1 pitting edema noted in her anterior shins bilaterally to the level of her knee; normal capillary refill; no cyanosis, no calf tenderness or swelling    SKIN: Normal color for age and race; warm; no rash NEURO: Moves all extremities equally PSYCH: The patient's mood and manner are appropriate. Grooming and personal hygiene are appropriate.  MEDICAL DECISION MAKING: Patient here with shortness of breath, wheezing.  Suspect repeat COPD exacerbation but could also be volume overload given her history of diastolic heart failure.  No recent infectious symptoms.  Denies any chest pain.  Will obtain labs, chest x-ray.  We will treat symptomatically with albuterol, Atrovent, Solu-Medrol.  Patient may need admission.  EKG shows no ischemic changes.  ED PROGRESS: She is labs show a leukocytosis of 15,000 with left shift.  Chest x-ray shows no pneumonia.  This could be from recent steroid use.  BNP mildly elevated.  Troponin negative.  No volume overload seen on  chest x-ray.  I suspect again that this is a COPD exacerbation.  She received approximately two thirds of her continuous treatment and began feeling very "jittery" and her heart rate was in the 160s with A. fib with RVR.  Albuterol has been stopped and will give diltiazem drip.  I feel she will need admission.  4:21 AM Discussed patient's case with hospitalist, Dr. Hal Hope.  I have recommended admission and patient (and family if present) agree with this plan. Admitting physician will place admission orders.   I reviewed all nursing notes, vitals, pertinent previous records, EKGs, lab and urine results, imaging (as available).     EKG Interpretation  Date/Time:  Monday August 02 2018 02:41:04 EST Ventricular Rate:  103 PR Interval:    QRS Duration: 76 QT Interval:  425 QTC Calculation: 523 R Axis:   56 Text Interpretation:  Atrial fibrillation Ventricular premature complex Low voltage, precordial leads Borderline abnrm T, anterolateral leads Minimal ST elevation, inferior leads Prolonged QT interval Baseline wander in lead(s) V2 No significant change since last tracing Confirmed by Ward, Cyril Mourning [phone]) on 08/02/2018 2:47:17 AM         EKG Interpretation  Date/Time:  Monday August 02 2018 04:24:24 EST Ventricular Rate:  176 PR Interval:    QRS Duration: 88 QT Interval:  267 QTC Calculation: 457 R Axis:   63 Text Interpretation:  Atrial fibrillation with rapid V-rate Repolarization abnormality, prob rate related Rapid rate new compared to prior Confirmed by Pryor Curia [phone]) on 08/02/2018 4:27:31 AM        CRITICAL CARE Performed by: Cyril Mourning Ward   Total critical care time: 45 minutes  Critical care time was exclusive of separately billable procedures and treating other patients.  Critical care was necessary to treat or prevent imminent or life-threatening deterioration.  Critical care was time spent personally by me on the following activities: development of  treatment plan with patient and/or surrogate as well as nursing, discussions with consultants, evaluation of patient's response to treatment, examination of patient, obtaining history from patient  or surrogate, ordering and performing treatments and interventions, ordering and review of laboratory studies, ordering and review of radiographic studies, pulse oximetry and re-evaluation of patient's condition.    Ward, Delice Bison, DO 08/02/18 [phone]

## 2018-08-02 NOTE — ED Notes (Signed)
Patient given breakfast tray.

## 2018-08-02 NOTE — Progress Notes (Addendum)
[name] - Stepdown/ICU TEAM  [name] Case  OVZ:[id number] DOB: 17-Dec-1931 DOA: 08/02/2018 PCP: Hoyt Koch, MD    Brief Narrative:  84 y.o. female with a hx of COPD, diastolic CHF (EF 41-28% in 2017), atrial fibrillation, sleep apnea, hypertension, and hypothyroidism who was recently admitted for a COPD exacerbation and discharged 3 days prior to this admit who presented w/ worsening shortness of breath and increasing LE edema.   In the ED a CXR was unremarkable. On exam the patient was wheezing and had bilateral lower extremity edema.  After a neb treatment the patient went into A. fib with RVR and was started on a Cardizem infusion.   Significant Events: 12/2 admit   Subjective: Pt is seen for a f/u visit.    Assessment & Plan:  Chronic atrial fibrillation with acute RVR  Acute COPD exacerbation  Acute CHF exacerbation Filed Weights   08/02/18 0240  Weight: (S) 98 kg    HTN  Hypothyroidism  Chronic anemia  Hypokalemia  Relative hypomagnesemia   Sleep apnea Noncompliant with C Pap  Morbid obesity - Body mass index is 42.18 kg/m.   DVT prophylaxis: eliquis  Code Status: DO NOT INTUBATE Family Communication:  Disposition Plan:   Consultants:  none  Antimicrobials:  Doxycycline 11/28 >  Objective: Blood pressure 118/63, pulse 74, temperature 98.6 F (37 C), temperature source Oral, resp. rate 17, height 5' (1.524 m), weight (S) 98 kg, SpO2 96 %.  Intake/Output Summary (Last 24 hours) at 08/02/2018 1424 Last data filed at 08/02/2018 0755 Gross per 24 hour  Intake 6.51 ml  Output 800 ml  Net -793.49 ml   Filed Weights   08/02/18 0240  Weight: (S) 98 kg    Examination: Pt was seen for a f/u visit.    CBC: Recent Labs  Lab 07/29/18 1724 08/02/18 0247 08/02/18 0511  WBC 7.1 15.8* 15.4*  NEUTROABS 4.6 13.2*  --   HGB 12.3 11.4* 11.7*  HCT 37.9 36.5 38.0  MCV 98.4 100.6* 102.4*  PLT 234 248 786   Basic Metabolic  Panel: Recent Labs  Lab 07/29/18 1724 07/30/18 0039 07/30/18 1001 07/31/18 0208 08/02/18 0247 08/02/18 0511  NA 141  --  138 139 139 139  K 2.7*  --  3.3* 3.8 3.3* 3.2*  CL 99  --  104 102 101 100  CO2 32  --  23 27 22 23   GLUCOSE 146*  --  366* 254* 147* 212*  BUN 13  --  13 21 28* 27*  CREATININE 0.94  --  1.18* 1.06* 1.12* 1.08*  CALCIUM 8.7*  --  8.6* 8.6* 8.6* 8.7*  MG  --  1.7  --   --   --   --    GFR: Estimated Creatinine Clearance: 40.7 mL/min (A) (by C-G formula based on SCr of 1.08 mg/dL (H)).  Liver Function Tests: Recent Labs  Lab 07/29/18 1724  AST 26  ALT 19  ALKPHOS 67  BILITOT 0.5  PROT 6.0*  ALBUMIN 3.3*   Cardiac Enzymes: Recent Labs  Lab 08/02/18 0511  TROPONINI <0.03    HbA1C: Hgb A1c MFr Bld  Date/Time Value Ref Range Status  07/30/2018 10:01 AM 5.9 (H) 4.8 - 5.6 % Final    Comment:    (NOTE) Pre diabetes:          5.7%-6.4% Diabetes:              >6.4% Glycemic control for   <7.0%  adults with diabetes   05/26/2018 12:25 PM 6.3 (H) 4.8 - 5.6 % Final    Comment:    (NOTE) Pre diabetes:          5.7%-6.4% Diabetes:              >6.4% Glycemic control for   <7.0% adults with diabetes     CBG: Recent Labs  Lab 07/30/18 0829 07/30/18 1649 07/30/18 2148 07/31/18 0710 07/31/18 1126  GLUCAP 279* 252* 251* 245* 242*     Scheduled Meds: . apixaban  5 mg Oral BID  . atorvastatin  20 mg Oral QPM  . budesonide (PULMICORT) nebulizer solution  0.25 mg Nebulization BID  . diltiazem  180 mg Oral Daily  . furosemide  20 mg Intravenous Q12H  . irbesartan  75 mg Oral Daily  . levalbuterol  0.63 mg Nebulization Q6H  . levothyroxine  100 mcg Oral QAC breakfast  . methylPREDNISolone (SOLU-MEDROL) injection  40 mg Intravenous Q12H  . metoprolol succinate  50 mg Oral Daily  . potassium chloride  20 mEq Oral Daily  . umeclidinium bromide  1 puff Inhalation Daily     LOS: 0 days   Time spent: No Charge  Cherene Altes,  MD [facility] Office  [phone] Pager - Text Page per Amion as per below:  On-Call/Text Page:      [email or web address]  If 7PM-7AM, please contact night-coverage [email or web address] 08/02/2018, 2:24 PM

## 2018-08-02 NOTE — ED Triage Notes (Signed)
Pt is here for worsening sob and "swelling".  Pt was admitted to the hospital from Thursday-Saturday and has been having increasing sob Sunday.  No fever or chills, increasing lower extremity edema.

## 2018-08-02 NOTE — ED Notes (Signed)
Report given to Vincente Liberty, RN on floor.

## 2018-08-02 NOTE — Plan of Care (Signed)

## 2018-08-03 ENCOUNTER — Other Ambulatory Visit: Payer: Self-pay

## 2018-08-03 DIAGNOSIS — E662 Morbid (severe) obesity with alveolar hypoventilation: Secondary | ICD-10-CM | POA: Diagnosis present

## 2018-08-03 DIAGNOSIS — E119 Type 2 diabetes mellitus without complications: Secondary | ICD-10-CM

## 2018-08-03 DIAGNOSIS — E876 Hypokalemia: Secondary | ICD-10-CM | POA: Diagnosis present

## 2018-08-03 DIAGNOSIS — I10 Essential (primary) hypertension: Secondary | ICD-10-CM

## 2018-08-03 DIAGNOSIS — D539 Nutritional anemia, unspecified: Secondary | ICD-10-CM | POA: Diagnosis present

## 2018-08-03 DIAGNOSIS — J9600 Acute respiratory failure, unspecified whether with hypoxia or hypercapnia: Secondary | ICD-10-CM | POA: Diagnosis present

## 2018-08-03 DIAGNOSIS — I5033 Acute on chronic diastolic (congestive) heart failure: Secondary | ICD-10-CM | POA: Diagnosis present

## 2018-08-03 DIAGNOSIS — R0602 Shortness of breath: Secondary | ICD-10-CM | POA: Diagnosis present

## 2018-08-03 DIAGNOSIS — J441 Chronic obstructive pulmonary disease with (acute) exacerbation: Secondary | ICD-10-CM | POA: Diagnosis not present

## 2018-08-03 DIAGNOSIS — Z831 Family history of other infectious and parasitic diseases: Secondary | ICD-10-CM | POA: Diagnosis not present

## 2018-08-03 DIAGNOSIS — Z885 Allergy status to narcotic agent status: Secondary | ICD-10-CM | POA: Diagnosis not present

## 2018-08-03 DIAGNOSIS — E039 Hypothyroidism, unspecified: Secondary | ICD-10-CM

## 2018-08-03 DIAGNOSIS — Z9119 Patient's noncompliance with other medical treatment and regimen: Secondary | ICD-10-CM | POA: Diagnosis not present

## 2018-08-03 DIAGNOSIS — Z9049 Acquired absence of other specified parts of digestive tract: Secondary | ICD-10-CM | POA: Diagnosis not present

## 2018-08-03 DIAGNOSIS — T380X5A Adverse effect of glucocorticoids and synthetic analogues, initial encounter: Secondary | ICD-10-CM | POA: Diagnosis present

## 2018-08-03 DIAGNOSIS — Z79890 Hormone replacement therapy (postmenopausal): Secondary | ICD-10-CM | POA: Diagnosis not present

## 2018-08-03 DIAGNOSIS — I110 Hypertensive heart disease with heart failure: Secondary | ICD-10-CM | POA: Diagnosis present

## 2018-08-03 DIAGNOSIS — Z7901 Long term (current) use of anticoagulants: Secondary | ICD-10-CM | POA: Diagnosis not present

## 2018-08-03 DIAGNOSIS — I5032 Chronic diastolic (congestive) heart failure: Secondary | ICD-10-CM

## 2018-08-03 DIAGNOSIS — K5900 Constipation, unspecified: Secondary | ICD-10-CM | POA: Diagnosis present

## 2018-08-03 DIAGNOSIS — E785 Hyperlipidemia, unspecified: Secondary | ICD-10-CM | POA: Diagnosis present

## 2018-08-03 DIAGNOSIS — Z79899 Other long term (current) drug therapy: Secondary | ICD-10-CM | POA: Diagnosis not present

## 2018-08-03 DIAGNOSIS — D72829 Elevated white blood cell count, unspecified: Secondary | ICD-10-CM | POA: Diagnosis present

## 2018-08-03 DIAGNOSIS — I482 Chronic atrial fibrillation, unspecified: Secondary | ICD-10-CM | POA: Diagnosis present

## 2018-08-03 DIAGNOSIS — Z90710 Acquired absence of both cervix and uterus: Secondary | ICD-10-CM | POA: Diagnosis not present

## 2018-08-03 DIAGNOSIS — Z6841 Body Mass Index (BMI) 40.0 and over, adult: Secondary | ICD-10-CM | POA: Diagnosis not present

## 2018-08-03 DIAGNOSIS — I4891 Unspecified atrial fibrillation: Secondary | ICD-10-CM | POA: Diagnosis not present

## 2018-08-03 LAB — BASIC METABOLIC PANEL
Anion gap: 9 (ref 5–15)
BUN: 26 mg/dL — ABNORMAL HIGH (ref 8–23)
CHLORIDE: 99 mmol/L (ref 98–111)
CO2: 31 mmol/L (ref 22–32)
Calcium: 8.7 mg/dL — ABNORMAL LOW (ref 8.9–10.3)
Creatinine, Ser: 1.06 mg/dL — ABNORMAL HIGH (ref 0.44–1.00)
GFR calc Af Amer: 56 mL/min — ABNORMAL LOW (ref >60)
GFR calc non Af Amer: 48 mL/min — ABNORMAL LOW (ref >60)
Glucose, Bld: 241 mg/dL — ABNORMAL HIGH (ref 70–99)
POTASSIUM: 4.3 mmol/L (ref 3.5–5.1)
Sodium: 139 mmol/L (ref 135–145)

## 2018-08-03 LAB — CBC
HCT: 37.4 % (ref 36.0–46.0)
Hemoglobin: 11.5 g/dL — ABNORMAL LOW (ref 12.0–15.0)
MCH: 30.9 pg (ref 26.0–34.0)
MCHC: 30.7 g/dL (ref 30.0–36.0)
MCV: 100.5 fL — ABNORMAL HIGH (ref 80.0–100.0)
Platelets: 278 10*3/uL (ref 150–400)
RBC: 3.72 MIL/uL — AB (ref 3.87–5.11)
RDW: 13.5 % (ref 11.5–15.5)
WBC: 16.6 10*3/uL — ABNORMAL HIGH (ref 4.0–10.5)
nRBC: 0 % (ref 0.0–0.2)

## 2018-08-03 LAB — MAGNESIUM: Magnesium: 2 mg/dL (ref 1.7–2.4)

## 2018-08-03 MED ORDER — LEVALBUTEROL HCL 0.63 MG/3ML IN NEBU: 0.6300 mg | INHALATION_SOLUTION | Freq: Two times a day (BID) | RESPIRATORY_TRACT | Status: DC

## 2018-08-03 MED ORDER — POLYETHYLENE GLYCOL 3350 17 G PO PACK
17.0000 g | PACK | Freq: Two times a day (BID) | ORAL | Status: AC
  Administered 2018-08-03 (×2): 17 g via ORAL
  Filled 2018-08-03 (×2): qty 1

## 2018-08-03 NOTE — Progress Notes (Signed)
89 while ambulating on room air

## 2018-08-03 NOTE — Evaluation (Signed)
Occupational Therapy Evaluation Patient Details Name: [name] Case MRN: [id number] DOB: 01-01-1932 Today's Date: 08/03/2018    History of Present Illness 84 y.o. female with a hx of COPD, diastolic CHF (EF 34-19% in 2017), atrial fibrillation, sleep apnea, hypertension, and hypothyroidism who was recently admitted for a COPD exacerbation and discharged 3 days prior to this admit who presented w/ worsening shortness of breath and increasing LE edema   Clinical Impression   PT admitted with see above information. Pt currently with functional limitiations due to the deficits listed below (see OT problem list). Pt could benefit from further energy conservation for adls. Pt plans to utilize RW and oxygen at home and needs practice managing lines with RW.  Pt will benefit from skilled OT to increase their independence and safety with adls and balance to allow discharge [place].     Follow Up Recommendations  Home health OT;Other (comment)(oxygen provider to help with home O2 setup)    Equipment Recommendations  None recommended by OT    Recommendations for Other Services       Precautions / Restrictions Precautions Precautions: Fall Precaution Comments: watch O2 sats Restrictions Weight Bearing Restrictions: No      Mobility Bed Mobility               General bed mobility comments: in chair on arrival  Transfers Overall transfer level: Modified independent   Transfers: Sit to/from Stand;Stand Pivot Transfers Sit to Stand: Modified independent (Device/Increase time) Stand pivot transfers: Modified independent (Device/Increase time)            Balance Overall balance assessment: Mild deficits observed, not formally tested                                         ADL either performed or assessed with clinical judgement   ADL Overall ADL's : Needs assistance/impaired Eating/Feeding: Independent   Grooming: Wash/dry hands;Wash/dry  face;Supervision/safety;Sitting       Lower Body Bathing: Maximal assistance       Lower Body Dressing: Maximal assistance                 General ADL Comments: pt educated on toilet tongs with demo. pt plans to purcahse. pt very concerned with oxygen tank for the home. pt reports feeling depressed with small temporary oxygen tank provided from last admission     Vision         Perception     Praxis      Pertinent Vitals/Pain Pain Assessment: No/denies pain     Hand Dominance Right   Extremity/Trunk Assessment Upper Extremity Assessment Upper Extremity Assessment: Overall WFL for tasks assessed       Cervical / Trunk Assessment Cervical / Trunk Assessment: Kyphotic   Communication Communication Communication: No difficulties   Cognition Arousal/Alertness: Awake/alert Behavior During Therapy: WFL for tasks assessed/performed Overall Cognitive Status: Within Functional Limits for tasks assessed                                     General Comments  biggest concern of patient is oxygen source    Exercises     Shoulder Instructions      Home Living Family/patient expects to be discharged to:: Private residence Living Arrangements: Other relatives Available Help at Discharge: Family Type of Home: House  Home Access: Ramped entrance     Home Layout: One level     Bathroom Shower/Tub: Occupational psychologist: Handicapped height     Home Equipment: [place] - single point;Walker - 2 wheels   Additional Comments: Pt lives with granddaughter who does not work. pt has 53 yo great granddaughter that helps alot .       Prior Functioning/Environment Level of Independence: Independent with assistive device(s)    ADL's / Homemaking Assistance Needed: baseline sponge bath and granddaughter does bath with patient standing maybe once a week . sponge bath below the waste and buttock completed by family   Comments: used cane to  mobilize        OT Problem List: Decreased strength;Decreased activity tolerance;Impaired balance (sitting and/or standing);Decreased safety awareness;Decreased knowledge of use of DME or AE;Decreased knowledge of precautions;Cardiopulmonary status limiting activity;Obesity      OT Treatment/Interventions: Self-care/ADL training;Therapeutic exercise;Energy conservation;DME and/or AE instruction;Therapeutic activities;Patient/family education;Balance training    OT Goals(Current goals can be found in the care plan section) Acute Rehab OT Goals Patient Stated Goal: to get a basket for my walker OT Goal Formulation: With patient Time For Goal Achievement: 08/17/18 Potential to Achieve Goals: Good  OT Frequency: Min 3X/week   Barriers to D/C:            Co-evaluation              AM-PAC OT "6 Clicks" Daily Activity     Outcome Measure Help from another person eating meals?: None Help from another person taking care of personal grooming?: A Little Help from another person toileting, which includes using toliet, bedpan, or urinal?: A Lot Help from another person bathing (including washing, rinsing, drying)?: A Lot Help from another person to put on and taking off regular upper body clothing?: A Little Help from another person to put on and taking off regular lower body clothing?: A Lot 6 Click Score: 16   End of Session Equipment Utilized During Treatment: Rolling walker;Oxygen Nurse Communication: Mobility status;Precautions  Activity Tolerance: Patient tolerated treatment well Patient left: in chair;with call bell/phone within reach  OT Visit Diagnosis: Unsteadiness on feet (R26.81);Muscle weakness (generalized) (M62.81)                Time: 9563-8756 OT Time Calculation (min): 33 min Charges:  OT General Charges $OT Visit: 1 Visit OT Evaluation $OT Eval Moderate Complexity: 1 Mod OT Treatments $Self Care/Home Management : 8-22 mins   Jeri Modena, OTR/L  Acute  Rehabilitation Services Pager: [phone] Office: [phone] .   Jeri Modena 08/03/2018, 4:22 PM

## 2018-08-03 NOTE — Evaluation (Signed)
Physical Therapy Evaluation Patient Details Name: [name] Case MRN: [id number] DOB: 01-27-1932 Today's Date: 08/03/2018   History of Present Illness  84 y.o. female with a hx of COPD, diastolic CHF (EF 24-82% in 2017), atrial fibrillation, sleep apnea, hypertension, and hypothyroidism who was recently admitted for a COPD exacerbation and discharged 3 days prior to this admit who presented w/ worsening shortness of breath and increasing LE edema  Clinical Impression  Orders received for PT evaluation. Patient demonstrates deficits in functional mobility as indicated below. Will benefit from continued skilled PT to address deficits and maximize function. Will see as indicated and progress as tolerated.  OF NOTE: one episode of HR elevation to upper to 130s with + lightheadedness/dizziness with ambulation, resolved with rest break    Follow Up Recommendations Home health PT;Supervision for mobility/OOB    Equipment Recommendations  None recommended by PT    Recommendations for Other Services       Precautions / Restrictions Precautions Precaution Comments: watch O2 sats Restrictions Weight Bearing Restrictions: No      Mobility  Bed Mobility Overal bed mobility: Needs Assistance Bed Mobility: Sit to Supine       Sit to supine: Min assist   General bed mobility comments: min assist for LE elevation back to bed  Transfers Overall transfer level: Modified independent   Transfers: Sit to/from [employer] Transfers Sit to Stand: Modified independent (Device/Increase time) Stand pivot transfers: Modified independent (Device/Increase time)       General transfer comment: transferring to [facility] by herself and performed x3 during session  Ambulation/Gait Ambulation/Gait assistance: Supervision Gait Distance (Feet): 180 Feet Assistive device: Rolling walker (2 wheeled) Gait Pattern/deviations: Step-through pattern;Decreased stride length;Trunk flexed;Wide base of  support Gait velocity: decreased   General Gait Details: patient ambulated on 3 liters with + dizziness and HR elevation to 130s, 2 standing rest breaks with cues for pursed lip breathing with HR improving to 90s at rest.   Stairs            Wheelchair Mobility    Modified Rankin (Stroke Patients Only)       Balance Overall balance assessment: Mild deficits observed, not formally tested                                           Pertinent Vitals/Pain Pain Assessment: No/denies pain    Home Living Family/patient expects to be discharged to:: Private residence Living Arrangements: Other relatives Available Help at Discharge: Family Type of Home: House Home Access: Ramped entrance     Home Layout: One level Home Equipment: Cane - single point;Walker - 2 wheels Additional Comments: Pt lives with granddaughter who does not work    Prior Function Level of Independence: Independent with assistive device(s)         Comments: used cane to mobilize     Hand Dominance   Dominant Hand: Right    Extremity/Trunk Assessment   Upper Extremity Assessment Upper Extremity Assessment: Generalized weakness    Lower Extremity Assessment Lower Extremity Assessment: Generalized weakness       Communication   Communication: No difficulties  Cognition Arousal/Alertness: Awake/alert Behavior During Therapy: WFL for tasks assessed/performed Overall Cognitive Status: Within Functional Limits for tasks assessed  General Comments      Exercises     Assessment/Plan    PT Assessment Patient needs continued PT services  PT Problem List Decreased strength;Decreased activity tolerance;Decreased balance;Decreased mobility;Cardiopulmonary status limiting activity       PT Treatment Interventions DME instruction;Gait training;Functional mobility training;Therapeutic activities;Therapeutic  exercise;Balance training    PT Goals (Current goals can be found in the Care Plan section)  Acute Rehab PT Goals Patient Stated Goal: to go home PT Goal Formulation: With patient Time For Goal Achievement: 08/17/18 Potential to Achieve Goals: Good    Frequency Min 3X/week   Barriers to discharge        Co-evaluation               AM-PAC PT "6 Clicks" Mobility  Outcome Measure Help needed turning from your back to your side while in a flat bed without using bedrails?: None Help needed moving from lying on your back to sitting on the side of a flat bed without using bedrails?: A Little Help needed moving to and from a bed to a chair (including a wheelchair)?: A Little Help needed standing up from a chair using your arms (e.g., wheelchair or bedside chair)?: A Little Help needed to walk in hospital room?: A Little Help needed climbing 3-5 steps with a railing? : A Little 6 Click Score: 19    End of Session Equipment Utilized During Treatment: Gait belt;Oxygen Activity Tolerance: Patient limited by fatigue Patient left: in bed;with call bell/phone within reach Nurse Communication: Mobility status PT Visit Diagnosis: Difficulty in walking, not elsewhere classified (R26.2)    Time: 4128-7867 PT Time Calculation (min) (ACUTE ONLY): 20 min   Charges:   PT Evaluation $PT Eval Moderate Complexity: 1 Mod          Alben Deeds, PT DPT  Board Certified Neurologic Specialist Acute Rehabilitation Services Pager [phone] Office [place] 08/03/2018, 10:32 AM

## 2018-08-03 NOTE — Progress Notes (Signed)
PROGRESS NOTE  TOMORROW DEHAAS XNA:[id number] DOB: [birth year] DOA: 08/02/2018 PCP: Hoyt Koch, MD   LOS: 0 days   Brief Narrative / Interim history: 84 y.o.femalewitha hx of COPD on 2 L by nasal cannula, diastolic CHF (EF 54-27% in 2017), atrial fibrillation, sleep apnea, hypertension, and hypothyroidism who was recently admitted for a COPD exacerbation and discharged 3 days prior to this admit who presented w/ worsening shortness of breath and increasing LE edema.   In the ED a CXR was unremarkable. On exam the patient was wheezing and had bilateral lower extremity edema. After a neb treatment the patient went into A. fib with RVR and was started on a Cardizem infusion.   Subjective: States that her breathing is much better.  Denies chest pain or palpitation.  Swelling in her leg has improved.  Asking for bowel regimen for constipation.  Assessment & Plan: Principal Problem:   Atrial fibrillation with RVR (HCC) Active Problems:   Hypothyroidism   Essential hypertension   Type 2 diabetes mellitus without complication (HCC)   Chronic CHF (congestive heart failure) (HCC)   Obesity hypoventilation syndrome (HCC)   COPD with acute exacerbation (HCC)  Acute COPD exacerbation: Improved. -Continue steroid, antibiotic, budesonide, Xopenex and DuoNeb's. -Wean oxygen to home level. -We will ambulate on room air, first then on home level. -Incentive spirometry -PT eval  Diastolic CHF exacerbation: Echo in 12/2015 with EF of 50 to 55% and no significant structural or functional abnormalities.  Appears euvolemic except for +1 edema bilaterally. -Appropriately responding to diuretics. -Continue IV Lasix.  Renal function improving -We will transition to oral diuretics tomorrow.  A. fib with RVR: RVR resolved.  Provoked by albuterol nebulizer.  -Xopenex for COPD -Transitioned to oral CCB and BB -Continue home Eliquis  Leukocytosis: Likely combination of hemoconcentration and  steroid.  Constipation -MiraLAX  Hypertension -Continue home meds.  Hypomagnesemia: Resolved.  OSA: Not compliant with CPAP.  Hypothyroidism -Resume Synthroid.  Chronic normocytic anemia: Stable  Scheduled Meds: . apixaban  5 mg Oral BID  . atorvastatin  20 mg Oral QPM  . budesonide (PULMICORT) nebulizer solution  0.25 mg Nebulization BID  . diltiazem  180 mg Oral Daily  . doxycycline  100 mg Oral Q12H  . furosemide  20 mg Intravenous Q12H  . ipratropium  1 mg Nebulization TID  . irbesartan  75 mg Oral Daily  . levalbuterol  0.63 mg Nebulization TID  . levothyroxine  100 mcg Oral QAC breakfast  . methylPREDNISolone (SOLU-MEDROL) injection  40 mg Intravenous Q12H  . metoprolol succinate  50 mg Oral Daily  . polyethylene glycol  17 g Oral BID   Continuous Infusions: PRN Meds:.acetaminophen, levalbuterol, ondansetron **OR** ondansetron (ZOFRAN) IV, traMADol  DVT prophylaxis: Eliquis Code Status: DO NOT INTUBATE Family Communication: No family member at bedside Disposition Plan: Remains inpatient for adequate diuresis with IV Lasix  Consultants:   None  Procedures:   None  Antimicrobials:  None  Objective: Vitals:   08/03/18 0800 08/03/18 0847 08/03/18 0859 08/03/18 1151  BP: 122/75   (!) 145/81  Pulse:  (!) 108  90  Resp: 18   18  Temp:    98.1 F (36.7 C)  TempSrc:    Oral  SpO2: 98%  100% 94%  Weight:      Height:        Intake/Output Summary (Last 24 hours) at 08/03/2018 1313 Last data filed at 08/03/2018 0800 Gross per 24 hour  Intake -  Output 700 ml  Net -700 ml   Filed Weights   08/02/18 0240  Weight: (S) 98 kg    Examination:  GENERAL: Appears well. No acute distress.  HEENT: MMM.  Vision and Hearing grossly intact.  NECK: Supple.  No JVD.  LUNGS:  No IWOB.  Fair air movement bilaterally.  No wheeze or crackles. HEART:  RRR. Heart sounds normal. ABD: Bowel sounds present. Soft. Non tender.  MSK/EXT: 1+ pitting edema  bilaterally SKIN: no apparent skin lesion.  NEURO: Awake, alert and oriented appropriately.  No gross deficit.  PSYCH: Calm. Normal affect.   Data Reviewed: I have independently reviewed following labs and imaging studies   CBC: Recent Labs  Lab 07/29/18 1724 08/02/18 0247 08/02/18 0511 08/03/18 0312  WBC 7.1 15.8* 15.4* 16.6*  NEUTROABS 4.6 13.2*  --   --   HGB 12.3 11.4* 11.7* 11.5*  HCT 37.9 36.5 38.0 37.4  MCV 98.4 100.6* 102.4* 100.5*  PLT 234 248 244 361   Basic Metabolic Panel: Recent Labs  Lab 07/30/18 0039 07/30/18 1001 07/31/18 0208 08/02/18 0247 08/02/18 0511 08/03/18 0312  NA  --  138 139 139 139 139  K  --  3.3* 3.8 3.3* 3.2* 4.3  CL  --  104 102 101 100 99  CO2  --  23 27 22 23 31   GLUCOSE  --  366* 254* 147* 212* 241*  BUN  --  13 21 28* 27* 26*  CREATININE  --  1.18* 1.06* 1.12* 1.08* 1.06*  CALCIUM  --  8.6* 8.6* 8.6* 8.7* 8.7*  MG 1.7  --   --   --   --  2.0   GFR: Estimated Creatinine Clearance: 41.5 mL/min (A) (by C-G formula based on SCr of 1.06 mg/dL (H)). Liver Function Tests: Recent Labs  Lab 07/29/18 1724  AST 26  ALT 19  ALKPHOS 67  BILITOT 0.5  PROT 6.0*  ALBUMIN 3.3*   No results for input(s): LIPASE, AMYLASE in the last 168 hours. No results for input(s): AMMONIA in the last 168 hours. Coagulation Profile: No results for input(s): INR, PROTIME in the last 168 hours. Cardiac Enzymes: Recent Labs  Lab 08/02/18 0511  TROPONINI <0.03   BNP (last 3 results) No results for input(s): PROBNP in the last 8760 hours. HbA1C: No results for input(s): HGBA1C in the last 72 hours. CBG: Recent Labs  Lab 07/30/18 0829 07/30/18 1649 07/30/18 2148 07/31/18 0710 07/31/18 1126  GLUCAP 279* 252* 251* 245* 242*   Lipid Profile: No results for input(s): CHOL, HDL, LDLCALC, TRIG, CHOLHDL, LDLDIRECT in the last 72 hours. Thyroid Function Tests: Recent Labs    08/02/18 0511  TSH 0.764   Anemia Panel: No results for input(s):  VITAMINB12, FOLATE, FERRITIN, TIBC, IRON, RETICCTPCT in the last 72 hours. Urine analysis:    Component Value Date/Time   COLORURINE YELLOW 06/12/2018 0608   APPEARANCEUR CLEAR 06/12/2018 0608   LABSPEC 1.008 06/12/2018 0608   PHURINE 9.0 (H) 06/12/2018 0608   GLUCOSEU NEGATIVE 06/12/2018 0608   HGBUR LARGE (A) 06/12/2018 0608   BILIRUBINUR NEGATIVE 06/12/2018 0608   BILIRUBINUR n 07/01/2012 1115   KETONESUR NEGATIVE 06/12/2018 0608   PROTEINUR NEGATIVE 06/12/2018 0608   UROBILINOGEN 0.2 01/20/2015 1153   NITRITE NEGATIVE 06/12/2018 0608   LEUKOCYTESUR LARGE (A) 06/12/2018 0608   Sepsis Labs: Invalid input(s): PROCALCITONIN, LACTICIDVEN  No results found for this or any previous visit (from the past 240 hour(s)).    Radiology Studies: No results found.   [facility] Pager [phone]  If 7PM-7AM, please contact night-coverage [email or web address] Password [email or web address] 08/03/2018, 1:13 PM

## 2018-08-04 LAB — BASIC METABOLIC PANEL
ANION GAP: 10 (ref 5–15)
BUN: 28 mg/dL — ABNORMAL HIGH (ref 8–23)
CO2: 33 mmol/L — ABNORMAL HIGH (ref 22–32)
Calcium: 8.7 mg/dL — ABNORMAL LOW (ref 8.9–10.3)
Chloride: 97 mmol/L — ABNORMAL LOW (ref 98–111)
Creatinine, Ser: 0.97 mg/dL (ref 0.44–1.00)
GFR calc Af Amer: >60 mL/min (ref >60)
GFR calc non Af Amer: 54 mL/min — ABNORMAL LOW (ref >60)
Glucose, Bld: 288 mg/dL — ABNORMAL HIGH (ref 70–99)
Potassium: 4.2 mmol/L (ref 3.5–5.1)
Sodium: 140 mmol/L (ref 135–145)

## 2018-08-04 LAB — MAGNESIUM: MAGNESIUM: 1.9 mg/dL (ref 1.7–2.4)

## 2018-08-04 MED ORDER — LEVALBUTEROL HCL 0.63 MG/3ML IN NEBU
0.6300 mg | INHALATION_SOLUTION | Freq: Three times a day (TID) | RESPIRATORY_TRACT | Status: DC
  Administered 2018-08-04: 0.63 mg via RESPIRATORY_TRACT
  Filled 2018-08-04 (×2): qty 3

## 2018-08-04 MED ORDER — LEVALBUTEROL HCL 0.63 MG/3ML IN NEBU: 0.6300 mg | INHALATION_SOLUTION | Freq: Four times a day (QID) | RESPIRATORY_TRACT | 12 refills | Status: DC | PRN

## 2018-08-04 MED ORDER — IPRATROPIUM BROMIDE 0.02 % IN SOLN: 1.0000 mg | Freq: Four times a day (QID) | RESPIRATORY_TRACT | 12 refills | Status: DC | PRN

## 2018-08-04 MED ORDER — FUROSEMIDE 40 MG PO TABS: 40.0000 mg | ORAL_TABLET | Freq: Every day | ORAL | 11 refills | Status: DC

## 2018-08-04 NOTE — Care Management Note (Signed)
Case Management Note Marvetta Gibbons RN, BSN Transitions of Care Unit 4E- RN Case Manager [phone]  Patient Details  Name: [name] Case MRN: [id number] Date of Birth: 1932-06-15  Subjective/Objective:   Pt admitted with afib                 Action/Plan: PTA pt lived at home, recently discharged with new home 02 and referral to [facility] for HHPT- spoke with pt at bedside- choice offered for [facility] agencies- pt states she wants to stay with [facility] for [facility] needs- Pt has portable 02 concentrator at the bedside for home- states [facility] to come out to home to see about smaller concentrator. Call made to [facility] with [facility] for HHRN/PT needs- referral accepted for resumption of care. Pt to return home with family.   Expected Discharge Date:  08/04/18               Expected Discharge Plan:  [place]  In-House Referral:     Discharge planning Services  CM Consult  Post Acute Care Choice:  Home Health Choice offered to:  Patient  DME Arranged:    DME Agency:     HH Arranged:  RN, PT [place] Agency:  [place]  Status of Service:  Completed, signed off  If discussed at [place] of Stay Meetings, dates discussed:    Discharge Disposition: home/home health   Additional Comments:  Dawayne Patricia, RN 08/04/2018, 11:39 AM

## 2018-08-04 NOTE — Plan of Care (Signed)
Discharge instructions reviewed with patient and grandaughter jessie. Both verbalized understanding of plan of care at home. RN Kristin Bruins to page Md and ? Potassium prescription if applicable.

## 2018-08-04 NOTE — Discharge Summary (Signed)
Physician Discharge Summary  [name] Case OVZ:[id number] DOB: 1931/09/17 DOA: 08/02/2018  PCP: Hoyt Koch, MD  Admit date: 08/02/2018 Discharge date: 08/04/2018  Admitted From:home Disposition:  home Recommendations for Outpatient Follow-up:  1. Follow up with PCP in 1-2 weeks 2. Please obtain BMP/CBC in one week  Home Health:pt rn Equipment/Devices none Discharge Condition: stable CODE STATUS:partial Diet recommendation: cardiac  Brief/Interim Summary:84 y.o.femalewithahxof COPD on 2 L by nasal cannula, diastolic CHF (EF 41-28% in 2017), atrial fibrillation, sleep apnea, hypertension,andhypothyroidism whowas recently admitted foraCOPD exacerbation and discharged 3 daysprior to this admit who presented w/worsening shortness of breath and increasing LE edema.  In the ED a CXRwas unremarkable. On exam thepatient was wheezing and had bilateral lower extremity edema. Afteraneb treatmentthepatient went into A. fib with RVRand wasstarted onaCardizem infusion.   Discharge Diagnoses:    #1 diastolic CHF exacerbation patient was admitted to telemetry floor started treatment with IV diuresis symptoms improved.  She was only taking Lasix as needed at home upon discharge she will be discharged on 40 mg Lasix daily.  Her last echo was from 2017 ejection fraction 50 to 55%.  Patient was seen by physical therapy recommended home health PT.  She has oxygen 24 hours a day at home 2 L.  #2 status post acute COPD exacerbation treated with IV Solu-Medrol Xopenex and DuoNeb.  Patient had A. fib RVR with albuterol so albuterol was stopped and Xopenex was started.  #3 A. fib RVR secondary to albuterol DC albuterol started Xopenex continue home dose of Eliquis.  Continue calcium channel blocker and beta-blocker.  #4 leukocytosis with no evidence of infection likely secondary to steroids and hemoconcentration.  Improved.  #4 hypertension continue home medications.  #5  hypothyroidism continue Synthroid.  #6 obstructive sleep apnea patient noncompliant with CPAP.  Nutrition Interventions:     Estimated body mass index is 42.18 kg/m as calculated from the following:   Height as of this encounter: 5' (1.524 m).   Weight as of this encounter: 98 kg.  Discharge Instructions  Discharge Instructions    Call MD for:  difficulty breathing, headache or visual disturbances   Complete by:  As directed    Call MD for:  persistant dizziness or light-headedness   Complete by:  As directed    Call MD for:  persistant nausea and vomiting   Complete by:  As directed    Diet - low sodium heart healthy   Complete by:  As directed    Increase activity slowly   Complete by:  As directed      Allergies as of 08/04/2018      Reactions   Codeine Hives, Rash   In cough syrup preparations      Medication List    STOP taking these medications   albuterol (2.5 MG/3ML) 0.083% nebulizer solution Commonly known as:  PROVENTIL     TAKE these medications   acetaminophen 500 MG tablet Commonly known as:  TYLENOL Take 500-1,000 mg by mouth every 8 (eight) hours as needed for moderate pain or headache.   apixaban 5 MG Tabs tablet Commonly known as:  ELIQUIS Take 1 tablet (5 mg total) by mouth 2 (two) times daily. Resume once the bleeding has stopped from the surgery for 2 days. What changed:  additional instructions   atorvastatin 20 MG tablet Commonly known as:  LIPITOR TAKE 1 TABLET BY MOUTH DAILY AT 6 PM What changed:    how much to take  how to take this  when to take this  additional instructions   diltiazem 180 MG 24 hr capsule Commonly known as:  CARDIZEM CD TAKE 1 CAPSULE BY MOUTH DAILY   doxycycline 100 MG tablet Commonly known as:  VIBRA-TABS Take 1 tablet (100 mg total) by mouth every 12 (twelve) hours.   furosemide 40 MG tablet Commonly known as:  LASIX Take 1 tablet (40 mg total) by mouth daily. What changed:    medication  strength  when to take this  reasons to take this   ipratropium 0.02 % nebulizer solution Commonly known as:  ATROVENT Take 5 mLs (1 mg total) by nebulization every 6 (six) hours as needed for wheezing or shortness of breath.   irbesartan 150 MG tablet Commonly known as:  AVAPRO Take 0.5 tablets (75 mg total) by mouth daily.   levalbuterol 0.63 MG/3ML nebulizer solution Commonly known as:  XOPENEX Take 3 mLs (0.63 mg total) by nebulization every 6 (six) hours as needed for wheezing or shortness of breath.   levothyroxine 100 MCG tablet Commonly known as:  SYNTHROID, LEVOTHROID Take 1 tablet (100 mcg total) by mouth daily. What changed:  when to take this   metoprolol succinate 50 MG 24 hr tablet Commonly known as:  TOPROL-XL TAKE 1 TABLET BY MOUTH DAILY WITH OR IMMEDIATELY FOLLOWING A MEAL What changed:  See the new instructions.   predniSONE 10 MG tablet Commonly known as:  DELTASONE Take 4 tablets (40 mg total) by mouth daily. 4 tablets daily x 2 days then 3 daily x 2 days then 2 daily x 2 days then 1 daily x 2 days (20) What changed:    how much to take  when to take this   tiotropium 18 MCG inhalation capsule Commonly known as:  SPIRIVA Place 1 capsule (18 mcg total) into inhaler and inhale daily.   traMADol 50 MG tablet Commonly known as:  ULTRAM Take 1-2 tablets (50-100 mg total) by mouth every 6 (six) hours as needed for moderate pain.      Follow-up Information    Hoyt Koch, MD Follow up.   Specialty:  Internal Medicine Contact information: [place] [address] [phone]          Allergies  Allergen Reactions  . Codeine Hives and Rash    In cough syrup preparations    Consultations:  None   Procedures/Studies: Dg Chest 2 View  Result Date: 07/29/2018 CLINICAL DATA:  Cough and shortness of breath for 3 days, cough productive of white sputum, history atrial fibrillation, CHF, hypertension EXAM: CHEST -  2 VIEW COMPARISON:  03/04/2018 FINDINGS: Enlargement of cardiac silhouette. Mediastinal contours and pulmonary vascularity normal. Lungs clear. No pleural effusion or pneumothorax. Bones demineralized. IMPRESSION: Enlargement of cardiac silhouette. No acute abnormalities. Electronically Signed   By: Lavonia Dana M.D.   On: 07/29/2018 18:00   Dg Chest Portable 1 View  Result Date: 08/02/2018 CLINICAL DATA:  Shortness of breath, lower extremity swelling. EXAM: PORTABLE CHEST 1 VIEW COMPARISON:  07/29/2018 FINDINGS: Cardiomegaly. No confluent opacities, effusions or edema. No acute bony abnormality. IMPRESSION: Cardiomegaly.  No active disease. Electronically Signed   By: Rolm Baptise M.D.   On: 08/02/2018 02:56    (Echo, Carotid, EGD, Colonoscopy, ERCP)    Subjective: Patient resting in bed in no acute distress anxious to go home denies chest pain shortness of breath she feels she is back to baseline.   Discharge Exam: Vitals:   08/04/18 0845 08/04/18 0848  BP:  Pulse:    Resp:    Temp:    SpO2: 96% 96%   Vitals:   08/04/18 0431 08/04/18 0740 08/04/18 0845 08/04/18 0848  BP: 121/66 (!) 139/92    Pulse:      Resp: 16 18    Temp: 97.9 F (36.6 C) (!) 96.7 F (35.9 C)    TempSrc: Oral Axillary    SpO2: 98% 98% 96% 96%  Weight:      Height:        General: Pt is alert, awake, not in acute distress Cardiovascular: RRR, S1/S2 +, no rubs, no gallops Respiratory: Minimal wheezing Abdominal: Soft, NT, ND, bowel sounds + Extremities: no edema, no cyanosis    The results of significant diagnostics from this hospitalization (including imaging, microbiology, ancillary and laboratory) are listed below for reference.     Microbiology: No results found for this or any previous visit (from the past 240 hour(s)).   Labs: BNP (last 3 results) Recent Labs    10/27/17 0131 07/30/18 0039 08/02/18 0248  BNP 247.9* 177.6* 419.3*   Basic Metabolic Panel: Recent Labs  Lab  07/30/18 0039  07/31/18 0208 08/02/18 0247 08/02/18 0511 08/03/18 0312 08/04/18 0221  NA  --    < > 139 139 139 139 140  K  --    < > 3.8 3.3* 3.2* 4.3 4.2  CL  --    < > 102 101 100 99 97*  CO2  --    < > 27 22 23 31  33*  GLUCOSE  --    < > 254* 147* 212* 241* 288*  BUN  --    < > 21 28* 27* 26* 28*  CREATININE  --    < > 1.06* 1.12* 1.08* 1.06* 0.97  CALCIUM  --    < > 8.6* 8.6* 8.7* 8.7* 8.7*  MG 1.7  --   --   --   --  2.0 1.9   < > = values in this interval not displayed.   Liver Function Tests: Recent Labs  Lab 07/29/18 1724  AST 26  ALT 19  ALKPHOS 67  BILITOT 0.5  PROT 6.0*  ALBUMIN 3.3*   No results for input(s): LIPASE, AMYLASE in the last 168 hours. No results for input(s): AMMONIA in the last 168 hours. CBC: Recent Labs  Lab 07/29/18 1724 08/02/18 0247 08/02/18 0511 08/03/18 0312  WBC 7.1 15.8* 15.4* 16.6*  NEUTROABS 4.6 13.2*  --   --   HGB 12.3 11.4* 11.7* 11.5*  HCT 37.9 36.5 38.0 37.4  MCV 98.4 100.6* 102.4* 100.5*  PLT 234 248 244 278   Cardiac Enzymes: Recent Labs  Lab 08/02/18 0511  TROPONINI <0.03   BNP: Invalid input(s): POCBNP CBG: Recent Labs  Lab 07/30/18 0829 07/30/18 1649 07/30/18 2148 07/31/18 0710 07/31/18 1126  GLUCAP 279* 252* 251* 245* 242*   D-Dimer No results for input(s): DDIMER in the last 72 hours. Hgb A1c No results for input(s): HGBA1C in the last 72 hours. Lipid Profile No results for input(s): CHOL, HDL, LDLCALC, TRIG, CHOLHDL, LDLDIRECT in the last 72 hours. Thyroid function studies Recent Labs    08/02/18 0511  TSH 0.764   Anemia work up No results for input(s): VITAMINB12, FOLATE, FERRITIN, TIBC, IRON, RETICCTPCT in the last 72 hours. Urinalysis    Component Value Date/Time   COLORURINE YELLOW 06/12/2018 0608   APPEARANCEUR CLEAR 06/12/2018 0608   LABSPEC 1.008 06/12/2018 0608   PHURINE 9.0 (H) 06/12/2018 7902  GLUCOSEU NEGATIVE 06/12/2018 0608   HGBUR LARGE (A) 06/12/2018 0608    BILIRUBINUR NEGATIVE 06/12/2018 0608   BILIRUBINUR n 07/01/2012 1115   KETONESUR NEGATIVE 06/12/2018 0608   PROTEINUR NEGATIVE 06/12/2018 0608   UROBILINOGEN 0.2 01/20/2015 1153   NITRITE NEGATIVE 06/12/2018 0608   LEUKOCYTESUR LARGE (A) 06/12/2018 0608   Sepsis Labs Invalid input(s): PROCALCITONIN,  WBC,  LACTICIDVEN Microbiology No results found for this or any previous visit (from the past 240 hour(s)).   Time coordinating discharge: 34  minutes  SIGNED:   Georgette Shell, MD  [facility] 08/04/2018, 10:23 AM Pager   If 7PM-7AM, please contact night-coverage [email or web address] Password [email or web address]

## 2018-08-04 NOTE — Progress Notes (Signed)
Patient instructed to follow up with her primary care for labs in one week.

## 2018-08-05 ENCOUNTER — Telehealth: Payer: Self-pay | Admitting: *Deleted

## 2018-08-05 NOTE — Telephone Encounter (Signed)
Transition Care Management Follow-up Telephone Call   Date discharged? 08/04/18   How have you been since you were released from the hospital? Pt states she is alright still little coughing alot   Do you understand why you were in the hospital? YES   Do you understand the discharge instructions? YES   Where were you discharged to? Home   Items Reviewed:  Medications reviewed: YES  Allergies reviewed: YES  Dietary changes reviewed: YES, heart healthy  Referrals reviewed: no referral needed   Functional Questionnaire:   Activities of Daily Living (ADLs):   She states she are independent in the following: bathing and hygiene, feeding, continence, grooming, toileting and dressing States they require assistance with the following: ambulation   Any transportation issues/concerns?: NO   Any patient concerns? YES, pt states she was sent home w/a big oxygen tank, but she want to have the portable one so she can be able to get around. The case manager told her that [facility] be coming out but no one have been out yet.    Confirmed importance and date/time of follow-up visits scheduled YES, 08/12/18  Provider Appointment booked with Dr. Sharlet Salina  Confirmed with patient if condition begins to worsen call PCP or go to the ER.  Patient was given the office number and encouraged to call back with question or concerns.  : YES

## 2018-08-06 DIAGNOSIS — E119 Type 2 diabetes mellitus without complications: Secondary | ICD-10-CM | POA: Diagnosis not present

## 2018-08-06 DIAGNOSIS — C679 Malignant neoplasm of bladder, unspecified: Secondary | ICD-10-CM | POA: Diagnosis not present

## 2018-08-06 DIAGNOSIS — Z792 Long term (current) use of antibiotics: Secondary | ICD-10-CM | POA: Diagnosis not present

## 2018-08-06 DIAGNOSIS — Z5181 Encounter for therapeutic drug level monitoring: Secondary | ICD-10-CM | POA: Diagnosis not present

## 2018-08-06 DIAGNOSIS — I5032 Chronic diastolic (congestive) heart failure: Secondary | ICD-10-CM | POA: Diagnosis not present

## 2018-08-06 DIAGNOSIS — I480 Paroxysmal atrial fibrillation: Secondary | ICD-10-CM | POA: Diagnosis not present

## 2018-08-06 DIAGNOSIS — D649 Anemia, unspecified: Secondary | ICD-10-CM | POA: Diagnosis not present

## 2018-08-06 DIAGNOSIS — K219 Gastro-esophageal reflux disease without esophagitis: Secondary | ICD-10-CM | POA: Diagnosis not present

## 2018-08-06 DIAGNOSIS — I110 Hypertensive heart disease with heart failure: Secondary | ICD-10-CM | POA: Diagnosis not present

## 2018-08-06 DIAGNOSIS — J441 Chronic obstructive pulmonary disease with (acute) exacerbation: Secondary | ICD-10-CM | POA: Diagnosis not present

## 2018-08-06 DIAGNOSIS — Z7901 Long term (current) use of anticoagulants: Secondary | ICD-10-CM | POA: Diagnosis not present

## 2018-08-06 DIAGNOSIS — J9621 Acute and chronic respiratory failure with hypoxia: Secondary | ICD-10-CM | POA: Diagnosis not present

## 2018-08-06 DIAGNOSIS — M1990 Unspecified osteoarthritis, unspecified site: Secondary | ICD-10-CM | POA: Diagnosis not present

## 2018-08-06 DIAGNOSIS — G4733 Obstructive sleep apnea (adult) (pediatric): Secondary | ICD-10-CM | POA: Diagnosis not present

## 2018-08-06 DIAGNOSIS — Z7952 Long term (current) use of systemic steroids: Secondary | ICD-10-CM | POA: Diagnosis not present

## 2018-08-06 DIAGNOSIS — R2689 Other abnormalities of gait and mobility: Secondary | ICD-10-CM | POA: Diagnosis not present

## 2018-08-06 DIAGNOSIS — M810 Age-related osteoporosis without current pathological fracture: Secondary | ICD-10-CM | POA: Diagnosis not present

## 2018-08-06 DIAGNOSIS — E785 Hyperlipidemia, unspecified: Secondary | ICD-10-CM | POA: Diagnosis not present

## 2018-08-09 ENCOUNTER — Telehealth: Payer: Self-pay | Admitting: Internal Medicine

## 2018-08-09 DIAGNOSIS — Z5181 Encounter for therapeutic drug level monitoring: Secondary | ICD-10-CM | POA: Diagnosis not present

## 2018-08-09 DIAGNOSIS — I480 Paroxysmal atrial fibrillation: Secondary | ICD-10-CM | POA: Diagnosis not present

## 2018-08-09 DIAGNOSIS — C679 Malignant neoplasm of bladder, unspecified: Secondary | ICD-10-CM | POA: Diagnosis not present

## 2018-08-09 DIAGNOSIS — R2689 Other abnormalities of gait and mobility: Secondary | ICD-10-CM | POA: Diagnosis not present

## 2018-08-09 DIAGNOSIS — M1990 Unspecified osteoarthritis, unspecified site: Secondary | ICD-10-CM | POA: Diagnosis not present

## 2018-08-09 DIAGNOSIS — G4733 Obstructive sleep apnea (adult) (pediatric): Secondary | ICD-10-CM | POA: Diagnosis not present

## 2018-08-09 DIAGNOSIS — Z7901 Long term (current) use of anticoagulants: Secondary | ICD-10-CM | POA: Diagnosis not present

## 2018-08-09 DIAGNOSIS — Z7952 Long term (current) use of systemic steroids: Secondary | ICD-10-CM | POA: Diagnosis not present

## 2018-08-09 DIAGNOSIS — I110 Hypertensive heart disease with heart failure: Secondary | ICD-10-CM | POA: Diagnosis not present

## 2018-08-09 DIAGNOSIS — I5032 Chronic diastolic (congestive) heart failure: Secondary | ICD-10-CM | POA: Diagnosis not present

## 2018-08-09 DIAGNOSIS — D649 Anemia, unspecified: Secondary | ICD-10-CM | POA: Diagnosis not present

## 2018-08-09 DIAGNOSIS — K219 Gastro-esophageal reflux disease without esophagitis: Secondary | ICD-10-CM | POA: Diagnosis not present

## 2018-08-09 DIAGNOSIS — J441 Chronic obstructive pulmonary disease with (acute) exacerbation: Secondary | ICD-10-CM | POA: Diagnosis not present

## 2018-08-09 DIAGNOSIS — E785 Hyperlipidemia, unspecified: Secondary | ICD-10-CM | POA: Diagnosis not present

## 2018-08-09 DIAGNOSIS — Z792 Long term (current) use of antibiotics: Secondary | ICD-10-CM | POA: Diagnosis not present

## 2018-08-09 DIAGNOSIS — E119 Type 2 diabetes mellitus without complications: Secondary | ICD-10-CM | POA: Diagnosis not present

## 2018-08-09 DIAGNOSIS — J9621 Acute and chronic respiratory failure with hypoxia: Secondary | ICD-10-CM | POA: Diagnosis not present

## 2018-08-09 DIAGNOSIS — M810 Age-related osteoporosis without current pathological fracture: Secondary | ICD-10-CM | POA: Diagnosis not present

## 2018-08-09 NOTE — Telephone Encounter (Signed)
Informed patient of MD response and states how is she supposed to get here if she has to carry her oxygen and push a walker. I asked her if she had anyone to come with her she said no and then said goodbye and hung up

## 2018-08-09 NOTE — Telephone Encounter (Signed)
Fine, but needs to keep visit upcoming

## 2018-08-09 NOTE — Telephone Encounter (Signed)
Fine but needs to keep visit

## 2018-08-09 NOTE — Telephone Encounter (Signed)
Copied from [place] [id number]. Topic: Quick Communication - Home Health Verbal Orders >> Aug 09, 2018  9:56 AM Conception Chancy, [place] wrote: Caller/Agency: [facility] Number: [phone] Requesting OT/PT/Skilled Nursing/Social Work: Nursing Frequency: would like verbals for 4 visits with 1 PRN visit.

## 2018-08-09 NOTE — Telephone Encounter (Signed)
Verbals given  

## 2018-08-09 NOTE — Telephone Encounter (Signed)
We can address with patient if appropriate at visit on 08/12/18.

## 2018-08-09 NOTE — Telephone Encounter (Signed)
Copied from [place] [id number]. Topic: Quick Communication - See Telephone Encounter >> Aug 09, 2018 12:39 PM Antonieta Iba C wrote: CRM for notification. See Telephone encounter for: 08/09/18.  Pt says that it is hard to manage carrying around her walker and a oxygen machine. Pt says that she requested to have one for her side/portable, pt would like to be advise and receive further assistance with obtaining a portable machine.   CB: [phone]

## 2018-08-09 NOTE — Telephone Encounter (Signed)
LVM with verbals 

## 2018-08-09 NOTE — Telephone Encounter (Signed)
Copied from [place] [id number]. Topic: Quick Communication - Home Health Verbal Orders >> Aug 09, 2018  3:06 PM Rutherford Nail, [place] wrote: Caller/Agency: Daleen Snook with [facility] Number: [phone] (Can leave a voicemail) Requesting OT/PT/Skilled Nursing/Social Work: PT Frequency: Evaluation today 2x a week for 2 weeks 1x a week for 2 weeks

## 2018-08-11 DIAGNOSIS — Z7952 Long term (current) use of systemic steroids: Secondary | ICD-10-CM | POA: Diagnosis not present

## 2018-08-11 DIAGNOSIS — C679 Malignant neoplasm of bladder, unspecified: Secondary | ICD-10-CM | POA: Diagnosis not present

## 2018-08-11 DIAGNOSIS — I480 Paroxysmal atrial fibrillation: Secondary | ICD-10-CM | POA: Diagnosis not present

## 2018-08-11 DIAGNOSIS — E785 Hyperlipidemia, unspecified: Secondary | ICD-10-CM | POA: Diagnosis not present

## 2018-08-11 DIAGNOSIS — Z7901 Long term (current) use of anticoagulants: Secondary | ICD-10-CM | POA: Diagnosis not present

## 2018-08-11 DIAGNOSIS — I110 Hypertensive heart disease with heart failure: Secondary | ICD-10-CM | POA: Diagnosis not present

## 2018-08-11 DIAGNOSIS — Z5181 Encounter for therapeutic drug level monitoring: Secondary | ICD-10-CM | POA: Diagnosis not present

## 2018-08-11 DIAGNOSIS — Z792 Long term (current) use of antibiotics: Secondary | ICD-10-CM | POA: Diagnosis not present

## 2018-08-11 DIAGNOSIS — D649 Anemia, unspecified: Secondary | ICD-10-CM | POA: Diagnosis not present

## 2018-08-11 DIAGNOSIS — R2689 Other abnormalities of gait and mobility: Secondary | ICD-10-CM | POA: Diagnosis not present

## 2018-08-11 DIAGNOSIS — E119 Type 2 diabetes mellitus without complications: Secondary | ICD-10-CM | POA: Diagnosis not present

## 2018-08-11 DIAGNOSIS — J9621 Acute and chronic respiratory failure with hypoxia: Secondary | ICD-10-CM | POA: Diagnosis not present

## 2018-08-11 DIAGNOSIS — M1990 Unspecified osteoarthritis, unspecified site: Secondary | ICD-10-CM | POA: Diagnosis not present

## 2018-08-11 DIAGNOSIS — K219 Gastro-esophageal reflux disease without esophagitis: Secondary | ICD-10-CM | POA: Diagnosis not present

## 2018-08-11 DIAGNOSIS — M810 Age-related osteoporosis without current pathological fracture: Secondary | ICD-10-CM | POA: Diagnosis not present

## 2018-08-11 DIAGNOSIS — J441 Chronic obstructive pulmonary disease with (acute) exacerbation: Secondary | ICD-10-CM | POA: Diagnosis not present

## 2018-08-11 DIAGNOSIS — G4733 Obstructive sleep apnea (adult) (pediatric): Secondary | ICD-10-CM | POA: Diagnosis not present

## 2018-08-11 DIAGNOSIS — I5032 Chronic diastolic (congestive) heart failure: Secondary | ICD-10-CM | POA: Diagnosis not present

## 2018-08-12 ENCOUNTER — Encounter: Payer: Self-pay | Admitting: Internal Medicine

## 2018-08-12 ENCOUNTER — Ambulatory Visit (INDEPENDENT_AMBULATORY_CARE_PROVIDER_SITE_OTHER): Payer: Medicare Other | Admitting: Internal Medicine

## 2018-08-12 VITALS — BP 100/70 | HR 88 | Temp 98.4°F | Ht 60.0 in | Wt 224.0 lb

## 2018-08-12 DIAGNOSIS — E119 Type 2 diabetes mellitus without complications: Secondary | ICD-10-CM

## 2018-08-12 DIAGNOSIS — F4323 Adjustment disorder with mixed anxiety and depressed mood: Secondary | ICD-10-CM

## 2018-08-12 DIAGNOSIS — J441 Chronic obstructive pulmonary disease with (acute) exacerbation: Secondary | ICD-10-CM

## 2018-08-12 DIAGNOSIS — I4891 Unspecified atrial fibrillation: Secondary | ICD-10-CM | POA: Diagnosis not present

## 2018-08-12 DIAGNOSIS — J9621 Acute and chronic respiratory failure with hypoxia: Secondary | ICD-10-CM

## 2018-08-12 MED ORDER — POTASSIUM CHLORIDE CRYS ER 20 MEQ PO TBCR: 20.0000 meq | EXTENDED_RELEASE_TABLET | Freq: Every day | ORAL | 3 refills | Status: DC

## 2018-08-12 MED ORDER — PREDNISONE 20 MG PO TABS: ORAL_TABLET | ORAL | 0 refills | Status: DC

## 2018-08-12 NOTE — Patient Instructions (Signed)
We have sent in potassium to take 1 pill daily.   We have sent in prednisone to take 2 pills daily for 5 days then 1 pill daily for 3 days then stop.

## 2018-08-12 NOTE — Progress Notes (Signed)
   Subjective:    Patient ID: [name] Case, female    DOB: 12-Oct-1931, 84 y.o.   MRN: [id number]  HPI The patient is an 84 YO female coming in for hospital follow up (in hospital twice most recently with COPD exacerbation and A fib rvr due to CHF, was treated with steroids and diuretics, on home oxygen and was sent home with a concentrator which she has today which is not portable). She is concerned as she will need this oxygen long term and has no portable device. She was initially sent home with walker and could not move the oxygen device and walk. She is now walking with cane and struggling to manage device and walking. She has family helping now but they cannot help for a long time. She does still have fatigue and SOB on exertion. She denies chest pains. Denies sputum. Denies new swelling or weight gain. Appetite is fair but not great. Drinking fluids normally. Denies fevers or chills. Overall feels like she is making progress rather than back sliding. Denies constipation or diarrhea. Taking medications as prescribed and off steroids currently.   PMH, [facility] social history reviewed and updated  Review of Systems  Constitutional: Positive for activity change, appetite change and fatigue. Negative for chills, fever and unexpected weight change.  HENT: Positive for congestion and postnasal drip. Negative for ear discharge, ear pain, rhinorrhea, sinus pressure, sinus pain, sneezing, sore throat, tinnitus, trouble swallowing and voice change.   Eyes: Negative.   Respiratory: Positive for cough and shortness of breath. Negative for chest tightness and wheezing.   Cardiovascular: Negative.  Negative for chest pain, palpitations and leg swelling.  Gastrointestinal: Negative.  Negative for abdominal distention, abdominal pain, constipation, diarrhea, nausea and vomiting.  Musculoskeletal: Positive for gait problem and myalgias.  Skin: Negative.   Neurological: Positive for weakness. Negative for  dizziness, light-headedness, numbness and headaches.  Psychiatric/Behavioral: Negative.       Objective:   Physical Exam Constitutional:      Appearance: She is well-developed.  HENT:     Head: Normocephalic and atraumatic.  Neck:     Musculoskeletal: Normal range of motion.  Cardiovascular:     Rate and Rhythm: Normal rate and regular rhythm.  Pulmonary:     Effort: Pulmonary effort is normal. No respiratory distress.     Breath sounds: Wheezing present. No rales.     Comments: Bilateral expiratory wheezing right worse than left Abdominal:     General: Bowel sounds are normal. There is no distension.     Palpations: Abdomen is soft.     Tenderness: There is no abdominal tenderness. There is no rebound.  Skin:    General: Skin is warm and dry.  Neurological:     Mental Status: She is alert and oriented to person, place, and time.     Coordination: Coordination normal.    Vitals:   08/12/18 1024  BP: 100/70  Pulse: 88  Temp: 98.4 F (36.9 C)  TempSrc: Oral  SpO2: 97%  Weight: 224 lb (101.6 kg)  Height: 5' (1.524 m)      Assessment & Plan:

## 2018-08-13 DIAGNOSIS — E119 Type 2 diabetes mellitus without complications: Secondary | ICD-10-CM | POA: Diagnosis not present

## 2018-08-13 DIAGNOSIS — Z7901 Long term (current) use of anticoagulants: Secondary | ICD-10-CM | POA: Diagnosis not present

## 2018-08-13 DIAGNOSIS — M1990 Unspecified osteoarthritis, unspecified site: Secondary | ICD-10-CM | POA: Diagnosis not present

## 2018-08-13 DIAGNOSIS — I110 Hypertensive heart disease with heart failure: Secondary | ICD-10-CM | POA: Diagnosis not present

## 2018-08-13 DIAGNOSIS — G4733 Obstructive sleep apnea (adult) (pediatric): Secondary | ICD-10-CM | POA: Diagnosis not present

## 2018-08-13 DIAGNOSIS — C679 Malignant neoplasm of bladder, unspecified: Secondary | ICD-10-CM | POA: Diagnosis not present

## 2018-08-13 DIAGNOSIS — M810 Age-related osteoporosis without current pathological fracture: Secondary | ICD-10-CM | POA: Diagnosis not present

## 2018-08-13 DIAGNOSIS — R2689 Other abnormalities of gait and mobility: Secondary | ICD-10-CM | POA: Diagnosis not present

## 2018-08-13 DIAGNOSIS — Z5181 Encounter for therapeutic drug level monitoring: Secondary | ICD-10-CM | POA: Diagnosis not present

## 2018-08-13 DIAGNOSIS — J441 Chronic obstructive pulmonary disease with (acute) exacerbation: Secondary | ICD-10-CM | POA: Diagnosis not present

## 2018-08-13 DIAGNOSIS — D649 Anemia, unspecified: Secondary | ICD-10-CM | POA: Diagnosis not present

## 2018-08-13 DIAGNOSIS — Z792 Long term (current) use of antibiotics: Secondary | ICD-10-CM | POA: Diagnosis not present

## 2018-08-13 DIAGNOSIS — E785 Hyperlipidemia, unspecified: Secondary | ICD-10-CM | POA: Diagnosis not present

## 2018-08-13 DIAGNOSIS — K219 Gastro-esophageal reflux disease without esophagitis: Secondary | ICD-10-CM | POA: Diagnosis not present

## 2018-08-13 DIAGNOSIS — I480 Paroxysmal atrial fibrillation: Secondary | ICD-10-CM | POA: Diagnosis not present

## 2018-08-13 DIAGNOSIS — Z7952 Long term (current) use of systemic steroids: Secondary | ICD-10-CM | POA: Diagnosis not present

## 2018-08-13 DIAGNOSIS — I5032 Chronic diastolic (congestive) heart failure: Secondary | ICD-10-CM | POA: Diagnosis not present

## 2018-08-13 DIAGNOSIS — J9621 Acute and chronic respiratory failure with hypoxia: Secondary | ICD-10-CM | POA: Diagnosis not present

## 2018-08-13 NOTE — Assessment & Plan Note (Signed)
She is not monitoring sugars currently but were well controlled in the hospital. She does not take medication for this currently.

## 2018-08-13 NOTE — Assessment & Plan Note (Addendum)
Finished steroids and still requiring oxygen all the time. Due to persistent wheezing on exam rx for another course of steroids to prevent disease progression and re-hospitalization. Will need more time for clinical improvement and strength building prior to weaning from oxygen if able.

## 2018-08-13 NOTE — Assessment & Plan Note (Signed)
No RVR today, still taking eliquis and diltiazem. Will follow up with cardiology. HR at goal.

## 2018-08-13 NOTE — Assessment & Plan Note (Signed)
Paperwork done for 2 L home oxygen all the time. She has been recommended [facility] oxygen in the past and now needs all the time and would recommend visit for recheck in 1-2 months to see if this can be changed. Overall is gradually improving functionally.

## 2018-08-13 NOTE — Assessment & Plan Note (Signed)
She is no longer taking remeron today and did not want to discuss today as her granddaughter was with her and this was the source of her depression symptoms currently.

## 2018-08-16 ENCOUNTER — Telehealth: Payer: Self-pay | Admitting: Internal Medicine

## 2018-08-16 DIAGNOSIS — M810 Age-related osteoporosis without current pathological fracture: Secondary | ICD-10-CM | POA: Diagnosis not present

## 2018-08-16 DIAGNOSIS — I110 Hypertensive heart disease with heart failure: Secondary | ICD-10-CM | POA: Diagnosis not present

## 2018-08-16 DIAGNOSIS — Z792 Long term (current) use of antibiotics: Secondary | ICD-10-CM | POA: Diagnosis not present

## 2018-08-16 DIAGNOSIS — C679 Malignant neoplasm of bladder, unspecified: Secondary | ICD-10-CM | POA: Diagnosis not present

## 2018-08-16 DIAGNOSIS — D649 Anemia, unspecified: Secondary | ICD-10-CM | POA: Diagnosis not present

## 2018-08-16 DIAGNOSIS — I5032 Chronic diastolic (congestive) heart failure: Secondary | ICD-10-CM | POA: Diagnosis not present

## 2018-08-16 DIAGNOSIS — G4733 Obstructive sleep apnea (adult) (pediatric): Secondary | ICD-10-CM | POA: Diagnosis not present

## 2018-08-16 DIAGNOSIS — K219 Gastro-esophageal reflux disease without esophagitis: Secondary | ICD-10-CM | POA: Diagnosis not present

## 2018-08-16 DIAGNOSIS — R2689 Other abnormalities of gait and mobility: Secondary | ICD-10-CM | POA: Diagnosis not present

## 2018-08-16 DIAGNOSIS — Z7952 Long term (current) use of systemic steroids: Secondary | ICD-10-CM | POA: Diagnosis not present

## 2018-08-16 DIAGNOSIS — I480 Paroxysmal atrial fibrillation: Secondary | ICD-10-CM | POA: Diagnosis not present

## 2018-08-16 DIAGNOSIS — Z7901 Long term (current) use of anticoagulants: Secondary | ICD-10-CM | POA: Diagnosis not present

## 2018-08-16 DIAGNOSIS — J441 Chronic obstructive pulmonary disease with (acute) exacerbation: Secondary | ICD-10-CM | POA: Diagnosis not present

## 2018-08-16 DIAGNOSIS — Z5181 Encounter for therapeutic drug level monitoring: Secondary | ICD-10-CM | POA: Diagnosis not present

## 2018-08-16 DIAGNOSIS — E785 Hyperlipidemia, unspecified: Secondary | ICD-10-CM | POA: Diagnosis not present

## 2018-08-16 DIAGNOSIS — E119 Type 2 diabetes mellitus without complications: Secondary | ICD-10-CM | POA: Diagnosis not present

## 2018-08-16 DIAGNOSIS — J9621 Acute and chronic respiratory failure with hypoxia: Secondary | ICD-10-CM | POA: Diagnosis not present

## 2018-08-16 DIAGNOSIS — M1990 Unspecified osteoarthritis, unspecified site: Secondary | ICD-10-CM | POA: Diagnosis not present

## 2018-08-16 NOTE — Telephone Encounter (Signed)
Copied from [place] [phone]. Topic: Quick Communication - Home Health Verbal Orders >> Aug 16, 2018  2:39 PM Judyann Munson wrote: Daleen Snook- [facility] is calling to advise the patient has denied all home health services. The patient stated she just doesn't want the service. Daleen Snook stated the patient still has a bad cough and she feels the patient does need more assistant to prevent her from returning back to the ED.   Best contact # is [phone] (Can leave a voicemail)

## 2018-08-16 NOTE — Telephone Encounter (Signed)
fyi

## 2018-08-18 ENCOUNTER — Telehealth: Payer: Self-pay

## 2018-08-18 NOTE — Telephone Encounter (Signed)
Copied from [place] [phone]. Topic: General - Other >> Aug 18, 2018  8:44 AM [name] Case wrote: Reason for CRM: Patient is calling to see if Dr Sharlet Salina would be willing to write a note for her grand daughter stating that she was in the hospital on thanksgiving day for her to take to her school to be excused. She said that her grand daughter stayed with her on those day and needs it for a waiver at school to be excused. Please Advise. November 27th, 28th, 29th and then she went back on November 30th, 31st, and December 1st. Call back @ [phone]

## 2018-08-18 NOTE — Telephone Encounter (Signed)
Fine to write

## 2018-08-18 NOTE — Telephone Encounter (Signed)
Letter written and placed up front for pick up. Patient was informed

## 2018-08-22 ENCOUNTER — Encounter (HOSPITAL_COMMUNITY): Payer: Self-pay

## 2018-08-22 ENCOUNTER — Other Ambulatory Visit: Payer: Self-pay

## 2018-08-22 ENCOUNTER — Emergency Department (HOSPITAL_COMMUNITY)
Admission: EM | Admit: 2018-08-22 | Discharge: 2018-08-22 | Disposition: A | Discharge status: Home / Self Care | Payer: Medicare Other | Attending: Emergency Medicine | Admitting: Emergency Medicine

## 2018-08-22 DIAGNOSIS — I509 Heart failure, unspecified: Secondary | ICD-10-CM | POA: Diagnosis not present

## 2018-08-22 DIAGNOSIS — Z859 Personal history of malignant neoplasm, unspecified: Secondary | ICD-10-CM | POA: Insufficient documentation

## 2018-08-22 DIAGNOSIS — T148XXA Other injury of unspecified body region, initial encounter: Secondary | ICD-10-CM

## 2018-08-22 DIAGNOSIS — L089 Local infection of the skin and subcutaneous tissue, unspecified: Secondary | ICD-10-CM

## 2018-08-22 DIAGNOSIS — R197 Diarrhea, unspecified: Secondary | ICD-10-CM | POA: Insufficient documentation

## 2018-08-22 DIAGNOSIS — I110 Hypertensive heart disease with heart failure: Secondary | ICD-10-CM | POA: Insufficient documentation

## 2018-08-22 DIAGNOSIS — E039 Hypothyroidism, unspecified: Secondary | ICD-10-CM | POA: Diagnosis not present

## 2018-08-22 DIAGNOSIS — Z79899 Other long term (current) drug therapy: Secondary | ICD-10-CM | POA: Insufficient documentation

## 2018-08-22 DIAGNOSIS — E119 Type 2 diabetes mellitus without complications: Secondary | ICD-10-CM | POA: Diagnosis not present

## 2018-08-22 DIAGNOSIS — M79605 Pain in left leg: Secondary | ICD-10-CM | POA: Diagnosis present

## 2018-08-22 MED ORDER — CEPHALEXIN 250 MG PO CAPS
500.0000 mg | ORAL_CAPSULE | Freq: Once | ORAL | Status: AC
  Administered 2018-08-22: 500 mg via ORAL
  Filled 2018-08-22: qty 2

## 2018-08-22 MED ORDER — CEPHALEXIN 500 MG PO CAPS: ORAL_CAPSULE | ORAL | 0 refills | Status: DC

## 2018-08-22 MED ORDER — TETANUS-DIPHTH-ACELL PERTUSSIS 5-2.5-18.5 LF-MCG/0.5 IM SUSP
0.5000 mL | Freq: Once | INTRAMUSCULAR | Status: AC
  Administered 2018-08-22: 0.5 mL via INTRAMUSCULAR
  Filled 2018-08-22: qty 0.5

## 2018-08-22 NOTE — ED Notes (Signed)
Patient verbalizes understanding of discharge instructions. Opportunity for questioning and answers were provided. Armband removed by staff, pt discharged from ED ambulatory w/ family member 

## 2018-08-22 NOTE — Discharge Instructions (Addendum)
You can take Imodium as directed for diarrhea.  Avoid milk or foods containing milk such as cheese or ice cream while having diarrhea. Make sure that you drink at least six 8 ounce glasses of water each day in order to stay well-hydrated.  Take the antibiotic as prescribed.  See Dr. Sharlet Salina in 3 days to get your wound rechecked.  If you are unable to see Dr. Sharlet Salina go to the [facility] urgent care center or another urgent care center.  Return to the emergency department if you develop vomiting, fever with temperature higher than 100.4 or feel worse for any reason

## 2018-08-22 NOTE — ED Triage Notes (Signed)
Pt states she fell a couple of weeks ago, she has injury to the left leg and reports pain. No swelling noted, minimal redness, not hot to touch. Pt denies hx of diabetes.

## 2018-08-22 NOTE — ED Provider Notes (Addendum)
[facility] EMERGENCY DEPARTMENT Provider Note   CSN: [id number] Arrival date & time: 08/22/18  1412     History   Chief Complaint Chief Complaint  Patient presents with  . Leg Pain    HPI [name] Case is a 84 y.o. female.  HPI Patient reports that she suffered laceration at lateral aspect of left lower leg approximately 2 weeks ago.  Over the past 2 days wound has become reddened, and slightly more painful and she is concerned that she may have an infection.  She did not suffer a fall in contradiction to the nurses notes.  She suffered a laceration on her left leg as result of being scraped by the edge of a car door when it opened.  No other injury.  Pain is mild to moderate rated as a 4 on a scale of 1-10.  Nothing makes symptoms better or worse.  No treatment prior to coming here.  She also reports 1 or 2 episodes of nonbloody diarrhea this morning.  No fever no abdominal pain no other associated symptoms. Past Medical History:  Diagnosis Date  . Allergy   . Arthritis   . Atrial fibrillation ([place])   . Bronchitis   . Cancer ([place])    Bilateral upper tract transitional cell carcinoma  . Cardiomegaly   . CHF (congestive heart failure) ([place])   . Complication of anesthesia    Difficult to arouse  . COPD (chronic obstructive pulmonary disease) ([facility])    borderline   pt. denies at preop  . Diabetes mellitus without complication ([place])    Controlled by diet and exercise  . Diverticulosis of sigmoid colon   . Dyspnea    at times  . Dysrhythmia    a fib  . Family history of bladder cancer   . Family history of colon cancer   . Family history of kidney cancer   . Fatty liver   . GERD (gastroesophageal reflux disease)   . History of hiatal hernia 01/20/2015   Small sliding, noted on CT  . History of kidney stones   . Hyperlipidemia   . Hypertension   . Hypothyroidism   . Lynch syndrome   . PONV (postoperative nausea and vomiting)   . Supraumbilical  hernia 72/53/6644   Small noted on CT  . Thyroid disease   . UTI (urinary tract infection)     Patient Active Problem List   Diagnosis Date Noted  . COPD with acute exacerbation ([place]) 07/30/2018  . Lynch syndrome 05/05/2018  . Genetic testing 04/28/2018  . Adjustment disorder 04/23/2018  . Urothelial cancer ([place]) 04/12/2018  . Lichen planus 03/47/4259  . Obesity hypoventilation syndrome ([place])   . Hypokalemia   . Chronic CHF (congestive heart failure) ([place]) 01/09/2016  . History of snoring 05/20/2015  . Acute on chronic respiratory failure with hypoxia ([place])   . Type 2 diabetes mellitus without complication ([place])   . Atrial fibrillation ([place]) 01/06/2015  . Hypothyroidism 03/09/2007  . Hyperlipidemia 03/09/2007  . Morbid obesity ([place]) 03/09/2007  . Essential hypertension 03/09/2007  . ALLERGIC RHINITIS 03/09/2007  . Asthma 03/09/2007  . GERD 03/09/2007  . INCONTINENCE 03/09/2007    Past Surgical History:  Procedure Laterality Date  . ABDOMINAL HYSTERECTOMY    . APPENDECTOMY    . CHOLECYSTECTOMY    . COLONOSCOPY  07/16/2009  . CYSTOSCOPY W/ URETERAL STENT PLACEMENT Right 12/04/2017   Procedure: CYSTOSCOPY WITH RETROGRADE PYELOGRAM/URETERAL STENT PLACEMENT;  Surgeon: Festus Aloe, MD;  Location: [facility];  Service: Urology;  Laterality: Right;  . CYSTOSCOPY W/ URETERAL STENT PLACEMENT Right 02/26/2018   Procedure: CYSTOSCOPY WITH RETROGRADE PYELOGRAM/URETERAL STENT PLACEMENT;  Surgeon: Kathie Rhodes, MD;  Location: [facility];  Service: Urology;  Laterality: Right;  . CYSTOSCOPY WITH URETEROSCOPY AND STENT PLACEMENT Bilateral 01/29/2018   Procedure: BILATERAL URETEROSCOPY AND TUMOR RESECTION  WITH LASER RIGHT STENT EXCHANGE LEFT STENT PLACEMENT;  Surgeon: Ardis Hughs, MD;  Location: [facility];  Service: Urology;  Laterality: Bilateral;  . CYSTOSCOPY WITH URETEROSCOPY AND STENT PLACEMENT Bilateral 02/10/2018   Procedure: BILATERAL URETEROSCOPY AND TUMOR EXCISION BILATERAL STENT  EXCHANGE;  Surgeon: Ardis Hughs, MD;  Location: [facility];  Service: Urology;  Laterality: Bilateral;  . CYSTOSCOPY/RETROGRADE/URETEROSCOPY Bilateral 12/24/2017   Procedure: BILATERAL URETEROSCOPY, RIGHT STENT EXCHANGE, STONE EXTRACTION WITH BASKET BILATERAL RETROGRADE PYELOGRAM, BIOPSY OF RIGHT UPPER POLE TUMOR,  BILATERAL RENAL PELVIS FLUID SENT FOR CYTOLOGY;  Surgeon: Ardis Hughs, MD;  Location: [facility];  Service: Urology;  Laterality: Bilateral;  . CYSTOSCOPY/URETEROSCOPY/HOLMIUM LASER/STENT PLACEMENT Bilateral 06/03/2018   Procedure: CYSTOSCOPY/BILATERAL URETEROSCOPY/ LASER TUMOR ABLATION BILATERAL / BILATERAL STENT PLACEMENT/BILATERAL RETROGRADE PYELOGRAM/BLADDER BIOPSY WITH FULGURATION;  Surgeon: Ardis Hughs, MD;  Location: [facility];  Service: Urology;  Laterality: Bilateral;  . HOLMIUM LASER APPLICATION Bilateral 83/09/5174   Procedure: HOLMIUM LASER APPLICATION;  Surgeon: Ardis Hughs, MD;  Location: [facility];  Service: Urology;  Laterality: Bilateral;  . KNEE SURGERY Right   . KNEE SURGERY Left    torn ligament repair  . UPPER GI ENDOSCOPY    . ureteroscopy laser tumor ablation bilateral stent placement     Dr. Louis Meckel 06-03-18      OB History   No obstetric history on file.      Home Medications    Prior to Admission medications   Medication Sig Start Date End Date Taking? Authorizing Provider  acetaminophen (TYLENOL) 500 MG tablet Take 500-1,000 mg by mouth every 8 (eight) hours as needed for moderate pain or headache.     [provider]  apixaban (ELIQUIS) 5 MG TABS tablet Take 1 tablet (5 mg total) by mouth 2 (two) times daily. Resume once the bleeding has stopped from the surgery for 2 days. Patient taking differently: Take 5 mg by mouth 2 (two) times daily.  02/10/18   Ardis Hughs, MD  atorvastatin (LIPITOR) 20 MG tablet TAKE 1 TABLET BY MOUTH DAILY AT 6 PM Patient taking differently: Take 20 mg by mouth every evening.  01/26/18    Hoyt Koch, MD  diltiazem (CARDIZEM CD) 180 MG 24 hr capsule TAKE 1 CAPSULE BY MOUTH DAILY Patient taking differently: Take 180 mg by mouth daily.  04/23/18   Hoyt Koch, MD  furosemide (LASIX) 40 MG tablet Take 1 tablet (40 mg total) by mouth daily. 08/04/18 08/04/19  Georgette Shell, MD  ipratropium (ATROVENT) 0.02 % nebulizer solution Take 5 mLs (1 mg total) by nebulization every 6 (six) hours as needed for wheezing or shortness of breath. 08/04/18   Georgette Shell, MD  irbesartan (AVAPRO) 150 MG tablet Take 0.5 tablets (75 mg total) by mouth daily. 07/31/18   Caren Griffins, MD  levalbuterol Penne Lash) 0.63 MG/3ML nebulizer solution Take 3 mLs (0.63 mg total) by nebulization every 6 (six) hours as needed for wheezing or shortness of breath. 08/04/18   Georgette Shell, MD  levothyroxine (SYNTHROID, LEVOTHROID) 100 MCG tablet Take 1 tablet (100 mcg total) by mouth daily. Patient taking differently: Take 100 mcg  by mouth daily before breakfast.  03/15/18   Hoyt Koch, MD  metoprolol succinate (TOPROL-XL) 50 MG 24 hr tablet TAKE 1 TABLET BY MOUTH DAILY WITH OR IMMEDIATELY FOLLOWING A MEAL Patient taking differently: Take 50 mg by mouth daily.  02/11/18   Jerline Pain, MD  potassium chloride SA (K-DUR,KLOR-CON) 20 MEQ tablet Take 1 tablet (20 mEq total) by mouth daily. 08/12/18   Hoyt Koch, MD  predniSONE (DELTASONE) 20 MG tablet Days 1-5 take 2 pills, day 6-9 take 1 pill daily 08/12/18   Hoyt Koch, MD  tiotropium (SPIRIVA HANDIHALER) 18 MCG inhalation capsule Place 1 capsule (18 mcg total) into inhaler and inhale daily. 07/31/18 07/31/19  Caren Griffins, MD  traMADol (ULTRAM) 50 MG tablet Take 1-2 tablets (50-100 mg total) by mouth every 6 (six) hours as needed for moderate pain. 06/03/18   Ardis Hughs, MD    Family History Family History  Problem Relation Age of Onset  . Heart disease Mother   . Colon cancer Mother  35       d. 60  . Tuberculosis Father   . Bladder Cancer Brother        urothelial cancer  . Bladder Cancer Brother        ureter cancer  . Kidney cancer Brother   . Skin cancer Sister   . Cancer Maternal Aunt        NOS  . Colon cancer Other        dx 1st time under 75, second time at 82  . Lung cancer Other   . Muscular dystrophy Other        d. 22  . Testicular cancer Other 24       great nephew  . Alcohol abuse Daughter   . Drug abuse Daughter   . Schizophrenia Daughter   . Colon cancer Grandchild     Social History Social History   Tobacco Use  . Smoking status: Never Smoker  . Smokeless tobacco: Never Used  Substance Use Topics  . Alcohol use: No    Alcohol/week: 0.0 standard drinks  . Drug use: No     Allergies   Codeine   Review of Systems Review of Systems  Gastrointestinal: Positive for diarrhea. Negative for blood in stool.  Skin: Positive for wound.  Allergic/Immunologic: Positive for immunocompromised state.       Diabetic.  Unknown tetanus immunization status  All other systems reviewed and are negative.    Physical Exam Updated Vital Signs BP (!) 113/59 (BP Location: Left Arm)   Pulse 92   Temp 99 F (37.2 C) (Oral)   Resp 18   LMP  (LMP Unknown)   SpO2 99%   Physical Exam Vitals signs and nursing note reviewed.  Constitutional:      Appearance: She is well-developed. She is not ill-appearing.  HENT:     Head: Normocephalic and atraumatic.  Eyes:     Conjunctiva/sclera: Conjunctivae normal.     Pupils: Pupils are equal, round, and reactive to light.  Neck:     Musculoskeletal: Neck supple.     Thyroid: No thyromegaly.     Trachea: No tracheal deviation.  Cardiovascular:     Rate and Rhythm: Normal rate and regular rhythm.     Heart sounds: No murmur.  Pulmonary:     Effort: Pulmonary effort is normal.     Breath sounds: Normal breath sounds.  Abdominal:     General: Bowel sounds are  normal. There is no distension.      Palpations: Abdomen is soft.     Tenderness: There is no abdominal tenderness.  Musculoskeletal: Normal range of motion.        General: No tenderness.     Comments: Left lower extremity there is a 3 cm V-shaped laceration at the lateral aspect of the calf with surrounding 5 cm area of redness.  No drainage from the wound.  No red streaks proximally.  Skin:    General: Skin is warm and dry.     Findings: No rash.  Neurological:     Mental Status: She is alert and oriented to person, place, and time.     Coordination: Coordination normal.     Comments: gaitnormal.  Not lightheaded on standing      ED Treatments / Results  Labs (all labs ordered are listed, but only abnormal results are displayed) Labs Reviewed - No data to display  EKG None  Radiology No results found.  Procedures Procedures (including critical care time)  Medications Ordered in ED Medications  cephALEXin (KEFLEX) capsule 500 mg (has no administration in time range)  Tdap (BOOSTRIX) injection 0.5 mL (has no administration in time range)   Declined pain medicine  Initial Impression / Assessment and Plan / ED Course  I have reviewed the triage vital signs and the nursing notes.  Pertinent labs & imaging results that were available during my care of the patient were reviewed by me and considered in my medical decision making (see chart for details).     Patient with localized wound infection.  No evidence of sepsis or dehydration tetanus immunization updated.  Plan prescription Keflex.  Local wound care.  Wound recheck 3 days.  Imodium for diarrhea.  Avoid dairy.  Encourage oral hydration.  Final Clinical Impressions(s) / ED Diagnoses  Diagnoses #1 localized  wound infection of left lower extremity Final diagnoses:  None  #2 diarrhea  ED Discharge Orders    None       Orlie Dakin, MD 08/22/18 [place], MD 08/22/18 [phone]

## 2018-08-23 ENCOUNTER — Other Ambulatory Visit: Payer: Self-pay | Admitting: Cardiology

## 2018-08-23 MED ORDER — METOPROLOL SUCCINATE ER 50 MG PO TB24: ORAL_TABLET | ORAL | 0 refills | Status: DC

## 2018-08-25 ENCOUNTER — Other Ambulatory Visit: Payer: Self-pay

## 2018-08-25 ENCOUNTER — Emergency Department (HOSPITAL_COMMUNITY)
Admission: EM | Admit: 2018-08-25 | Discharge: 2018-08-25 | Disposition: A | Discharge status: Home / Self Care | Payer: Medicare Other | Attending: Emergency Medicine | Admitting: Emergency Medicine

## 2018-08-25 ENCOUNTER — Encounter (HOSPITAL_COMMUNITY): Payer: Self-pay | Admitting: Emergency Medicine

## 2018-08-25 ENCOUNTER — Emergency Department (HOSPITAL_COMMUNITY): Payer: Medicare Other

## 2018-08-25 DIAGNOSIS — J441 Chronic obstructive pulmonary disease with (acute) exacerbation: Secondary | ICD-10-CM | POA: Diagnosis not present

## 2018-08-25 DIAGNOSIS — E039 Hypothyroidism, unspecified: Secondary | ICD-10-CM | POA: Insufficient documentation

## 2018-08-25 DIAGNOSIS — E785 Hyperlipidemia, unspecified: Secondary | ICD-10-CM | POA: Diagnosis not present

## 2018-08-25 DIAGNOSIS — Z7901 Long term (current) use of anticoagulants: Secondary | ICD-10-CM | POA: Diagnosis not present

## 2018-08-25 DIAGNOSIS — R05 Cough: Secondary | ICD-10-CM | POA: Diagnosis not present

## 2018-08-25 DIAGNOSIS — I4891 Unspecified atrial fibrillation: Secondary | ICD-10-CM | POA: Diagnosis not present

## 2018-08-25 DIAGNOSIS — R0689 Other abnormalities of breathing: Secondary | ICD-10-CM | POA: Diagnosis not present

## 2018-08-25 DIAGNOSIS — R040 Epistaxis: Secondary | ICD-10-CM

## 2018-08-25 DIAGNOSIS — I959 Hypotension, unspecified: Secondary | ICD-10-CM | POA: Diagnosis not present

## 2018-08-25 DIAGNOSIS — Z79899 Other long term (current) drug therapy: Secondary | ICD-10-CM | POA: Diagnosis not present

## 2018-08-25 DIAGNOSIS — I110 Hypertensive heart disease with heart failure: Secondary | ICD-10-CM | POA: Insufficient documentation

## 2018-08-25 DIAGNOSIS — I509 Heart failure, unspecified: Secondary | ICD-10-CM | POA: Insufficient documentation

## 2018-08-25 DIAGNOSIS — E119 Type 2 diabetes mellitus without complications: Secondary | ICD-10-CM | POA: Diagnosis not present

## 2018-08-25 DIAGNOSIS — R0602 Shortness of breath: Secondary | ICD-10-CM | POA: Diagnosis not present

## 2018-08-25 LAB — CBC WITH DIFFERENTIAL/PLATELET
ABS IMMATURE GRANULOCYTES: 0.02 10*3/uL (ref 0.00–0.07)
Basophils Absolute: 0 10*3/uL (ref 0.0–0.1)
Basophils Relative: 1 %
Eosinophils Absolute: 0.4 10*3/uL (ref 0.0–0.5)
Eosinophils Relative: 5 %
HCT: 37.3 % (ref 36.0–46.0)
Hemoglobin: 11.4 g/dL — ABNORMAL LOW (ref 12.0–15.0)
Immature Granulocytes: 0 %
Lymphocytes Relative: 23 %
Lymphs Abs: 1.5 10*3/uL (ref 0.7–4.0)
MCH: 31.4 pg (ref 26.0–34.0)
MCHC: 30.6 g/dL (ref 30.0–36.0)
MCV: 102.8 fL — ABNORMAL HIGH (ref 80.0–100.0)
Monocytes Absolute: 0.6 10*3/uL (ref 0.1–1.0)
Monocytes Relative: 10 %
NEUTROS ABS: 4 10*3/uL (ref 1.7–7.7)
Neutrophils Relative %: 61 %
Platelets: 201 10*3/uL (ref 150–400)
RBC: 3.63 MIL/uL — ABNORMAL LOW (ref 3.87–5.11)
RDW: 13.6 % (ref 11.5–15.5)
WBC: 6.6 10*3/uL (ref 4.0–10.5)
nRBC: 0 % (ref 0.0–0.2)

## 2018-08-25 LAB — BASIC METABOLIC PANEL
Anion gap: 11 (ref 5–15)
BUN: 19 mg/dL (ref 8–23)
CO2: 29 mmol/L (ref 22–32)
Calcium: 9 mg/dL (ref 8.9–10.3)
Chloride: 102 mmol/L (ref 98–111)
Creatinine, Ser: 1.06 mg/dL — ABNORMAL HIGH (ref 0.44–1.00)
GFR calc Af Amer: 56 mL/min — ABNORMAL LOW (ref >60)
GFR calc non Af Amer: 48 mL/min — ABNORMAL LOW (ref >60)
Glucose, Bld: 137 mg/dL — ABNORMAL HIGH (ref 70–99)
Potassium: 5 mmol/L (ref 3.5–5.1)
Sodium: 142 mmol/L (ref 135–145)

## 2018-08-25 LAB — I-STAT TROPONIN, ED: Troponin i, poc: 0.01 ng/mL (ref 0.00–0.08)

## 2018-08-25 LAB — BRAIN NATRIURETIC PEPTIDE: B Natriuretic Peptide: 132.8 pg/mL — ABNORMAL HIGH (ref 0.0–100.0)

## 2018-08-25 MED ORDER — PREDNISONE 20 MG PO TABS
60.0000 mg | ORAL_TABLET | Freq: Once | ORAL | Status: AC
  Administered 2018-08-25: 60 mg via ORAL
  Filled 2018-08-25: qty 3

## 2018-08-25 MED ORDER — PREDNISONE 20 MG PO TABS: 60.0000 mg | ORAL_TABLET | Freq: Every day | ORAL | 0 refills | Status: DC

## 2018-08-25 MED ORDER — BENZONATATE 100 MG PO CAPS: 100.0000 mg | ORAL_CAPSULE | Freq: Three times a day (TID) | ORAL | 0 refills | Status: DC

## 2018-08-25 MED ORDER — IPRATROPIUM-ALBUTEROL 0.5-2.5 (3) MG/3ML IN SOLN
3.0000 mL | Freq: Once | RESPIRATORY_TRACT | Status: AC
  Administered 2018-08-25: 3 mL via RESPIRATORY_TRACT
  Filled 2018-08-25: qty 3

## 2018-08-25 MED ORDER — OXYMETAZOLINE HCL 0.05 % NA SOLN
1.0000 | Freq: Once | NASAL | Status: AC
  Administered 2018-08-25: 1 via NASAL
  Filled 2018-08-25: qty 15

## 2018-08-25 NOTE — Discharge Instructions (Signed)
You have a mild COPD exacerbation please take steroids once daily for the next 5 days as directed.  Use your nebulizer machine once every 4 hours for the next 24 hours and then as needed anytime you have chest tightness or shortness of breath.  You may use Tessalon Perles for cough.  It is important to try this prior to coming to the emergency department but if you are having to use this every 2 hours and are continuing to have worsening shortness of breath return for reevaluation.  Follow-up with your primary care doctor.  Continue using your daily inhalers as directed.  You had a slight nosebleed, if this occurs again use Afrin 1 spray in each nostril and hold pressure for at least 10 minutes.  Continue wearing your home oxygen as directed.  Return to the emergency department for chest pain, worsening cough, fever, worsening shortness of breath or any other new or concerning symptoms.

## 2018-08-25 NOTE — ED Triage Notes (Signed)
Pt in from home via [facility] with c/o sob that woke her from sleeping around 0400. Denies any cp, has COPD and CHF. Pt states she's had cough with white sputum x 1 mo, denies fever or cp. Wears 2LNC at home, sats 97%, breaths diminished lower

## 2018-08-25 NOTE — ED Provider Notes (Addendum)
[facility] EMERGENCY DEPARTMENT Provider Note   CSN: [id number] Arrival date & time: 08/25/18  0543     History   Chief Complaint Chief Complaint  Patient presents with  . Shortness of Breath  . Cough    HPI [name] Case is a 84 y.o. female.  [name] Case is a 84 y.o. female with a history of recently diagnosed COPD, CHF, A. fib, diabetes, hypertension, hyperlipidemia, who presents to the emergency department for evaluation of shortness of breath.  She reports that for the past month she has been having a worsening cough occasionally productive of clear mucus.  She reports that this morning she woke up at 4 AM and felt very short of breath like she could not catch her breath.  She did not use any medications or nebulizer treatments at home prior to arrival, but got her granddaughter who lives with her and cares for her to bring her to the hospital.  She denies any associated chest pain.  No fevers.  No abdominal pain, nausea or vomiting.  She has been compliant with her fluid pills and has not had any worsening lower extremity swelling over the past few days.  At baseline she is on 2 L nasal cannula of oxygen at all times, and is not requiring any additional oxygen today.  Does have history of A. fib but has been taking her medications regularly and appears to be rate controlled on arrival.     Past Medical History:  Diagnosis Date  . Allergy   . Arthritis   . Atrial fibrillation ([place])   . Bronchitis   . Cancer ([place])    Bilateral upper tract transitional cell carcinoma  . Cardiomegaly   . CHF (congestive heart failure) ([place])   . Complication of anesthesia    Difficult to arouse  . COPD (chronic obstructive pulmonary disease) (HCC)    borderline   pt. denies at preop  . Diabetes mellitus without complication ([place])    Controlled by diet and exercise  . Diverticulosis of sigmoid colon   . Dyspnea    at times  . Dysrhythmia    a fib  . Family  history of bladder cancer   . Family history of colon cancer   . Family history of kidney cancer   . Fatty liver   . GERD (gastroesophageal reflux disease)   . History of hiatal hernia 01/20/2015   Small sliding, noted on CT  . History of kidney stones   . Hyperlipidemia   . Hypertension   . Hypothyroidism   . Lynch syndrome   . PONV (postoperative nausea and vomiting)   . Supraumbilical hernia 44/81/8563   Small noted on CT  . Thyroid disease   . UTI (urinary tract infection)     Patient Active Problem List   Diagnosis Date Noted  . COPD with acute exacerbation ([place]) 07/30/2018  . Lynch syndrome 05/05/2018  . Genetic testing 04/28/2018  . Adjustment disorder 04/23/2018  . Urothelial cancer ([place]) 04/12/2018  . Lichen planus 14/97/0263  . Obesity hypoventilation syndrome ([place])   . Hypokalemia   . Chronic CHF (congestive heart failure) ([place]) 01/09/2016  . History of snoring 05/20/2015  . Acute on chronic respiratory failure with hypoxia ([place])   . Type 2 diabetes mellitus without complication ([place])   . Atrial fibrillation ([place]) 01/06/2015  . Hypothyroidism 03/09/2007  . Hyperlipidemia 03/09/2007  . Morbid obesity ([place]) 03/09/2007  . Essential hypertension 03/09/2007  . ALLERGIC RHINITIS 03/09/2007  .  Asthma 03/09/2007  . GERD 03/09/2007  . INCONTINENCE 03/09/2007    Past Surgical History:  Procedure Laterality Date  . ABDOMINAL HYSTERECTOMY    . APPENDECTOMY    . CHOLECYSTECTOMY    . COLONOSCOPY  07/16/2009  . CYSTOSCOPY W/ URETERAL STENT PLACEMENT Right 12/04/2017   Procedure: CYSTOSCOPY WITH RETROGRADE PYELOGRAM/URETERAL STENT PLACEMENT;  Surgeon: Festus Aloe, MD;  Location: [facility];  Service: Urology;  Laterality: Right;  . CYSTOSCOPY W/ URETERAL STENT PLACEMENT Right 02/26/2018   Procedure: CYSTOSCOPY WITH RETROGRADE PYELOGRAM/URETERAL STENT PLACEMENT;  Surgeon: Kathie Rhodes, MD;  Location: [facility];  Service: Urology;  Laterality: Right;  . CYSTOSCOPY WITH  URETEROSCOPY AND STENT PLACEMENT Bilateral 01/29/2018   Procedure: BILATERAL URETEROSCOPY AND TUMOR RESECTION  WITH LASER RIGHT STENT EXCHANGE LEFT STENT PLACEMENT;  Surgeon: Ardis Hughs, MD;  Location: [facility];  Service: Urology;  Laterality: Bilateral;  . CYSTOSCOPY WITH URETEROSCOPY AND STENT PLACEMENT Bilateral 02/10/2018   Procedure: BILATERAL URETEROSCOPY AND TUMOR EXCISION BILATERAL STENT EXCHANGE;  Surgeon: Ardis Hughs, MD;  Location: [facility];  Service: Urology;  Laterality: Bilateral;  . CYSTOSCOPY/RETROGRADE/URETEROSCOPY Bilateral 12/24/2017   Procedure: BILATERAL URETEROSCOPY, RIGHT STENT EXCHANGE, STONE EXTRACTION WITH BASKET BILATERAL RETROGRADE PYELOGRAM, BIOPSY OF RIGHT UPPER POLE TUMOR,  BILATERAL RENAL PELVIS FLUID SENT FOR CYTOLOGY;  Surgeon: Ardis Hughs, MD;  Location: [facility];  Service: Urology;  Laterality: Bilateral;  . CYSTOSCOPY/URETEROSCOPY/HOLMIUM LASER/STENT PLACEMENT Bilateral 06/03/2018   Procedure: CYSTOSCOPY/BILATERAL URETEROSCOPY/ LASER TUMOR ABLATION BILATERAL / BILATERAL STENT PLACEMENT/BILATERAL RETROGRADE PYELOGRAM/BLADDER BIOPSY WITH FULGURATION;  Surgeon: Ardis Hughs, MD;  Location: [facility];  Service: Urology;  Laterality: Bilateral;  . HOLMIUM LASER APPLICATION Bilateral 16/09/958   Procedure: HOLMIUM LASER APPLICATION;  Surgeon: Ardis Hughs, MD;  Location: [facility];  Service: Urology;  Laterality: Bilateral;  . KNEE SURGERY Right   . KNEE SURGERY Left    torn ligament repair  . UPPER GI ENDOSCOPY    . ureteroscopy laser tumor ablation bilateral stent placement     Dr. Louis Meckel 06-03-18      OB History   No obstetric history on file.      Home Medications    Prior to Admission medications   Medication Sig Start Date End Date Taking? Authorizing Provider  acetaminophen (TYLENOL) 500 MG tablet Take 500-1,000 mg by mouth every 8 (eight) hours as needed for moderate pain or headache.    Yes [provider]  apixaban  (ELIQUIS) 5 MG TABS tablet Take 1 tablet (5 mg total) by mouth 2 (two) times daily. Resume once the bleeding has stopped from the surgery for 2 days. Patient taking differently: Take 5 mg by mouth 2 (two) times daily.  02/10/18  Yes Ardis Hughs, MD  atorvastatin (LIPITOR) 20 MG tablet TAKE 1 TABLET BY MOUTH DAILY AT 6 PM Patient taking differently: Take 20 mg by mouth every evening.  01/26/18  Yes Hoyt Koch, MD  cephALEXin (KEFLEX) 500 MG capsule 1 caps po bid x 7 days 08/22/18  Yes Orlie Dakin, MD  diltiazem (CARDIZEM CD) 180 MG 24 hr capsule TAKE 1 CAPSULE BY MOUTH DAILY Patient taking differently: Take 180 mg by mouth daily.  04/23/18  Yes Hoyt Koch, MD  furosemide (LASIX) 40 MG tablet Take 1 tablet (40 mg total) by mouth daily. 08/04/18 08/04/19 Yes Georgette Shell, MD  ipratropium (ATROVENT) 0.02 % nebulizer solution Take 5 mLs (1 mg total) by nebulization every 6 (six) hours as needed for wheezing or shortness of  breath. 08/04/18  Yes Georgette Shell, MD  irbesartan (AVAPRO) 150 MG tablet Take 0.5 tablets (75 mg total) by mouth daily. 07/31/18  Yes Gherghe, Vella Redhead, MD  levalbuterol (XOPENEX) 0.63 MG/3ML nebulizer solution Take 3 mLs (0.63 mg total) by nebulization every 6 (six) hours as needed for wheezing or shortness of breath. 08/04/18  Yes Georgette Shell, MD  levothyroxine (SYNTHROID, LEVOTHROID) 100 MCG tablet Take 1 tablet (100 mcg total) by mouth daily. Patient taking differently: Take 100 mcg by mouth daily before breakfast.  03/15/18  Yes Hoyt Koch, MD  metoprolol succinate (TOPROL-XL) 50 MG 24 hr tablet TAKE 1 TABLET BY MOUTH DAILY WITH OR IMMEDIATELY FOLLOWING A MEALPlease make overdue appt with Dr. Marlou Porch before anymore refills.1st attempt Patient taking differently: Take 50 mg by mouth daily. TAKE 1 TABLET BY MOUTH DAILY WITH OR IMMEDIATELY FOLLOWING A MEALPlease make overdue appt with Dr. Marlou Porch before anymore refills.1st  attempt 08/23/18  Yes Jerline Pain, MD  potassium chloride SA (K-DUR,KLOR-CON) 20 MEQ tablet Take 1 tablet (20 mEq total) by mouth daily. 08/12/18  Yes Hoyt Koch, MD  tiotropium (SPIRIVA HANDIHALER) 18 MCG inhalation capsule Place 1 capsule (18 mcg total) into inhaler and inhale daily. 07/31/18 07/31/19 Yes Gherghe, Vella Redhead, MD  traMADol (ULTRAM) 50 MG tablet Take 1-2 tablets (50-100 mg total) by mouth every 6 (six) hours as needed for moderate pain. 06/03/18  Yes Ardis Hughs, MD  benzonatate (TESSALON) 100 MG capsule Take 1 capsule (100 mg total) by mouth every 8 (eight) hours. 08/25/18   Jacqlyn Larsen, PA-C  predniSONE (DELTASONE) 20 MG tablet Take 3 tablets (60 mg total) by mouth daily for 5 days. 08/25/18 08/30/18  Jacqlyn Larsen, PA-C    Family History Family History  Problem Relation Age of Onset  . Heart disease Mother   . Colon cancer Mother 53       d. 22  . Tuberculosis Father   . Bladder Cancer Brother        urothelial cancer  . Bladder Cancer Brother        ureter cancer  . Kidney cancer Brother   . Skin cancer Sister   . Cancer Maternal Aunt        NOS  . Colon cancer Other        dx 1st time under 20, second time at 57  . Lung cancer Other   . Muscular dystrophy Other        d. 49  . Testicular cancer Other 24       great nephew  . Alcohol abuse Daughter   . Drug abuse Daughter   . Schizophrenia Daughter   . Colon cancer Grandchild     Social History Social History   Tobacco Use  . Smoking status: Never Smoker  . Smokeless tobacco: Never Used  Substance Use Topics  . Alcohol use: No    Alcohol/week: 0.0 standard drinks  . Drug use: No     Allergies   Codeine   Review of Systems Review of Systems  Constitutional: Negative for chills and fever.  HENT: Negative for congestion, rhinorrhea and sore throat.   Eyes: Negative for visual disturbance.  Respiratory: Positive for cough and shortness of breath. Negative for chest  tightness and wheezing.   Cardiovascular: Negative for chest pain, palpitations and leg swelling.  Gastrointestinal: Negative for abdominal pain, nausea and vomiting.  Genitourinary: Negative for dysuria and frequency.  Musculoskeletal: Negative for arthralgias and  myalgias.  Skin: Negative for color change and rash.  Neurological: Negative for dizziness, syncope and light-headedness.     Physical Exam Updated Vital Signs BP (!) 103/56   Pulse 60   Temp 97.7 F (36.5 C) (Oral)   Resp 16   Wt 99.8 kg   LMP  (LMP Unknown)   SpO2 97%   BMI 42.97 kg/m   Physical Exam Vitals signs and nursing note reviewed.  Constitutional:      General: She is not in acute distress.    Appearance: She is well-developed. She is not diaphoretic.  HENT:     Head: Normocephalic and atraumatic.     Nose: Nose normal.     Mouth/Throat:     Mouth: Mucous membranes are moist.     Pharynx: Oropharynx is clear.  Eyes:     General:        Right eye: No discharge.        Left eye: No discharge.  Neck:     Musculoskeletal: Neck supple.  Cardiovascular:     Rate and Rhythm: Normal rate and regular rhythm.     Pulses: Normal pulses.     Heart sounds: Normal heart sounds. No murmur. No friction rub. No gallop.   Pulmonary:     Effort: Pulmonary effort is normal. No respiratory distress.     Breath sounds: Wheezing present.     Comments: On 2 L nasal cannula and satting well, respirations equal and unlabored, patient able to speak in full sentences, lungs with mild expiratory wheezes throughout and some diminished breath sounds. Abdominal:     General: Abdomen is flat. Bowel sounds are normal. There is no distension.     Palpations: Abdomen is soft. There is no mass.     Tenderness: There is no abdominal tenderness. There is no guarding.     Comments: Abdomen soft, nondistended, nontender to palpation in all quadrants without guarding or peritoneal signs  Musculoskeletal:     Right lower leg: No  edema.     Left lower leg: No edema.  Skin:    General: Skin is warm and dry.     Capillary Refill: Capillary refill takes less than 2 seconds.  Neurological:     Mental Status: She is alert and oriented to person, place, and time. Mental status is at baseline.     Coordination: Coordination normal.  Psychiatric:        Mood and Affect: Mood normal.        Behavior: Behavior normal.      ED Treatments / Results  Labs (all labs ordered are listed, but only abnormal results are displayed) Labs Reviewed  BASIC METABOLIC PANEL - Abnormal; Notable for the following components:      Result Value   Glucose, Bld 137 (*)    Creatinine, Ser 1.06 (*)    GFR calc non Af Amer 48 (*)    GFR calc Af Amer 56 (*)    All other components within normal limits  CBC WITH DIFFERENTIAL/PLATELET - Abnormal; Notable for the following components:   RBC 3.63 (*)    Hemoglobin 11.4 (*)    MCV 102.8 (*)    All other components within normal limits  BRAIN NATRIURETIC PEPTIDE - Abnormal; Notable for the following components:   B Natriuretic Peptide 132.8 (*)    All other components within normal limits  I-STAT TROPONIN, ED    EKG EKG Interpretation  Date/Time:  Wednesday August 25 2018 05:46:45 EST Ventricular  Rate:  70 PR Interval:    QRS Duration: 96 QT Interval:  376 QTC Calculation: 406 R Axis:   49 Text Interpretation:  Atrial fibrillation Low voltage, precordial leads Borderline T abnormalities, diffuse leads Abnormal ekg Confirmed by Ripley Fraise [phone]) on 08/25/2018 5:53:48 AM   Radiology Dg Chest 2 View  Result Date: 08/25/2018 CLINICAL DATA:  Shortness of breath. EXAM: CHEST - 2 VIEW COMPARISON:  08/02/2018 FINDINGS: 0710 hours. The cardio pericardial silhouette is enlarged. There is pulmonary vascular congestion without overt pulmonary edema. The visualized bony structures of the thorax are intact. Hazy opacity at the bases likely related to superimposition of soft tissues.  Telemetry leads overlie the chest. IMPRESSION: No active cardiopulmonary disease. Electronically Signed   By: Misty Stanley M.D.   On: 08/25/2018 07:33    Procedures Procedures (including critical care time)  Medications Ordered in ED Medications  predniSONE (DELTASONE) tablet 60 mg (60 mg Oral Given 08/25/18 0723)  ipratropium-albuterol (DUONEB) 0.5-2.5 (3) MG/3ML nebulizer solution 3 mL (3 mLs Nebulization Given 08/25/18 0723)  oxymetazoline (AFRIN) 0.05 % nasal spray 1 spray (1 spray Each Nare Given 08/25/18 0809)     Initial Impression / Assessment and Plan / ED Course  I have reviewed the triage vital signs and the nursing notes.  Pertinent labs & imaging results that were available during my care of the patient were reviewed by me and considered in my medical decision making (see chart for details).  Presents to the emergency department for evaluation of shortness of breath and cough.  She was recently diagnosed with COPD in November.  Wears 2 L nasal cannula at all times at baseline and is not having any increased oxygen requirement today.  Reports that this morning when she woke up at 4 AM she felt she was having a hard time catching her breath.  Did not use any nebulizers or medications prior to arrival.  On initial evaluation she is not hypoxic, mild tachypnea.  She is slightly diminished throughout with some faint expiratory wheezes.  No chest pain.  Occasional coughing.  Suspect mild COPD exacerbation but patient also has history of CHF we will get basic labs, chest x-ray, EKG, troponin and BNP to see if CHF exacerbation is contributing to patient's symptoms.  Treat with nebulizer treatment and prednisone.  Labs reassuring, no leukocytosis, stable hemoglobin, no acute electrolyte derangements requiring intervention, creatinine is 1.06 which is close to baseline.  Negative troponin and EKG without concerning ischemic changes.  BNP is only slightly elevated at 132.8, and this is lower  than typical for the patient making CHF exacerbation unlikely in patient's presentation definitely seems more consistent with COPD.  Chest x-ray shows no active cardiopulmonary disease, no evidence of acute pneumonia.  After breathing treatment steroids patient reports significant improvement in her symptoms.  Notified by nursing that patient was having an episode of coughing up blood, on my evaluation the patient appears to be having a superficial nosebleed from the left nare that is running down the back of her throat causing her to cough up blood, oxymetolazone was applied to bilateral nares and pressure hold with good hemostasis from the nosebleed with no further bleeding, patient is not having any hemoptysis.  At this time she is stable for discharge home with burst of steroids, spent a long time counseling patient on using her nebulizer machine when she become symptomatic like this and seeing if this improves her symptoms.  Encouraged her to follow-up with her PCP.  She  expresses understanding and is in agreement with this plan.  Discharged home in good condition.  Final Clinical Impressions(s) / ED Diagnoses   Final diagnoses:  COPD exacerbation ([place])  Epistaxis    ED Discharge Orders         Ordered    predniSONE (DELTASONE) 20 MG tablet  Daily     08/25/18 0815    benzonatate (TESSALON) 100 MG capsule  Every 8 hours     08/25/18 0815           Jacqlyn Larsen, PA-C 08/25/18 0936    Jacqlyn Larsen, PA-C 08/25/18 0945    Lennice Sites, DO 08/25/18 1609

## 2018-08-25 NOTE — ED Provider Notes (Signed)
Medical screening examination/treatment/procedure(s) were conducted as a shared visit with non-physician practitioner(s) and myself.  I personally evaluated the patient during the encounter. Briefly, the patient is a 84 y.o. female with history of COPD on 2 L, heart failure who presents to the ED with cough, shortness of breath.  Patient denies any chest pain.  Has some trace leg swelling that she states is chronic.  Has fairly clear breath sounds on exam, patient has already had a DuoNeb.  Patient has nebulizer at home which she has not used.  She woke up this morning feeling a little bit more short of breath than she normally does.  Vitals overall unremarkable.  Patient stable on 2 L of oxygen.  Patient likely with mild COPD exacerbation versus heart failure.  EKG shows atrial fibrillation at 70 bpm.  Otherwise no ischemic changes.  Patient is on blood thinner.  Low concern for PE given likelihood of other diagnosis.  Chest x-ray showed no signs of pneumonia, pneumothorax, pleural effusion.  Patient with no significant anemia, electrolyte abnormality, kidney injury.  BNP improved from prior.  Patient feels better after DuoNeb's.Patient likely with mild COPD exacerbation.  Given steroids here in the ED.  Will discharged home with steroid prescription.  Educated about how to use her nebulizer.  Given return precautions and recommend follow-up with primary care doctor.  This chart was dictated using voice recognition software.  Despite best efforts to proofread,  errors can occur which can change the documentation meaning.    EKG Interpretation  Date/Time:  Wednesday August 25 2018 05:46:45 EST Ventricular Rate:  70 PR Interval:    QRS Duration: 96 QT Interval:  376 QTC Calculation: 406 R Axis:   49 Text Interpretation:  Atrial fibrillation Low voltage, precordial leads Borderline T abnormalities, diffuse leads Abnormal ekg Confirmed by Ripley Fraise [phone]) on 08/25/2018 5:53:48 AM           Lennice Sites, DO 08/25/18 4008

## 2018-08-30 ENCOUNTER — Encounter: Payer: Self-pay | Admitting: Internal Medicine

## 2018-08-30 ENCOUNTER — Ambulatory Visit (INDEPENDENT_AMBULATORY_CARE_PROVIDER_SITE_OTHER): Payer: Medicare Other | Admitting: Internal Medicine

## 2018-08-30 DIAGNOSIS — I5032 Chronic diastolic (congestive) heart failure: Secondary | ICD-10-CM

## 2018-08-30 DIAGNOSIS — J9611 Chronic respiratory failure with hypoxia: Secondary | ICD-10-CM

## 2018-08-30 DIAGNOSIS — I4891 Unspecified atrial fibrillation: Secondary | ICD-10-CM

## 2018-08-30 DIAGNOSIS — J441 Chronic obstructive pulmonary disease with (acute) exacerbation: Secondary | ICD-10-CM | POA: Diagnosis not present

## 2018-08-30 MED ORDER — UMECLIDINIUM-VILANTEROL 62.5-25 MCG/INH IN AEPB: 1.0000 | INHALATION_SPRAY | Freq: Every day | RESPIRATORY_TRACT | 6 refills | Status: DC

## 2018-08-30 NOTE — Progress Notes (Signed)
   Subjective:   Patient ID: [name] Case, female    DOB: 01-17-1932, 84 y.o.   MRN: [id number]  HPI The patient is an 84 YO female coming in for ER follow up (in for COPD exacerbation, given prednisone and tessalon perles, stable on her 2L oxygen). She is feeling bad overall with low energy. Overall it is improving since being in the ER. She is not sure if this is the medication or other. She is still using oxygen and nebulizer about 3 times per day. Is not sure which nebulizer to use. Using the spiriva daily and is not sure it is helping at all. Denies fevers or chills. Having some SOB. Mostly low energy and no endurance. Refused PT at home. Denies chest pains. Denies abdominal pains, some diarrhea.    PMH, [facility], social history reviewed and updated  Review of Systems  Constitutional: Positive for activity change and fatigue. Negative for appetite change, chills, fever and unexpected weight change.  HENT: Negative.   Eyes: Negative.   Respiratory: Positive for cough and shortness of breath. Negative for chest tightness.   Cardiovascular: Negative for chest pain, palpitations and leg swelling.  Gastrointestinal: Negative for abdominal distention, abdominal pain, constipation, diarrhea, nausea and vomiting.  Musculoskeletal: Negative.   Skin: Negative.   Neurological: Negative.   Psychiatric/Behavioral: Negative.     Objective:  Physical Exam Constitutional:      Appearance: She is well-developed. She is obese.  HENT:     Head: Normocephalic and atraumatic.  Neck:     Musculoskeletal: Normal range of motion.  Cardiovascular:     Rate and Rhythm: Normal rate and regular rhythm.  Pulmonary:     Effort: Pulmonary effort is normal.     Breath sounds: Normal breath sounds. No wheezing or rales.     Comments: On oxygen, gets SOB with walking back to room Abdominal:     General: Bowel sounds are normal. There is no distension.     Palpations: Abdomen is soft.     Tenderness: There is  no abdominal tenderness. There is no rebound.  Skin:    General: Skin is warm and dry.  Neurological:     Mental Status: She is alert and oriented to person, place, and time.     Coordination: Coordination normal.     Vitals:   08/30/18 1430  BP: 130/80  Pulse: 88  Temp: 98.3 F (36.8 C)  TempSrc: Oral  SpO2: 98%  Weight: 222 lb (100.7 kg)  Height: 5' (1.524 m)    Assessment & Plan:

## 2018-08-30 NOTE — Patient Instructions (Signed)
We will send in anoro to use instead of spiriva when you run out.  It is okay to take both of the xopenex and the atrovent.   Ask [facility] if there is anything we can do to get the portable oxygen covered by insurance.

## 2018-08-31 ENCOUNTER — Telehealth: Payer: Self-pay | Admitting: Internal Medicine

## 2018-08-31 NOTE — Assessment & Plan Note (Signed)
Weight is stable, she understands that her weight is causing some of her breathing problems.

## 2018-08-31 NOTE — Assessment & Plan Note (Signed)
On 2 L oxygen and working on getting portable oxygen so she can regain some independence as this is very difficult for her recently.

## 2018-08-31 NOTE — Assessment & Plan Note (Signed)
Finished prednisone today lung exam improving and clinically improving. Will stop prednisone now and change to anoro from spiriva. Gave advice on when to use what nebulizers at home.

## 2018-08-31 NOTE — Assessment & Plan Note (Signed)
No flare today.  

## 2018-08-31 NOTE — Assessment & Plan Note (Signed)
Rate controlled today, on eliquis for anticoagulation. Having some nose bleeds.

## 2018-08-31 NOTE — Telephone Encounter (Signed)
Copied from [place] [id number]. Topic: General - Other >> Aug 31, 2018 11:11 AM Lennox Solders wrote: Reason for CRM: pt saw dr crawford yesterday and would like order fax to [facility] for a portable oxygen that she can wear on her side. [facility] fax [phone]

## 2018-09-02 NOTE — Telephone Encounter (Signed)
Faxed to [facility]  

## 2018-09-06 ENCOUNTER — Telehealth: Payer: Self-pay | Admitting: Internal Medicine

## 2018-09-06 ENCOUNTER — Other Ambulatory Visit: Payer: Self-pay | Admitting: Internal Medicine

## 2018-09-06 DIAGNOSIS — J441 Chronic obstructive pulmonary disease with (acute) exacerbation: Secondary | ICD-10-CM | POA: Diagnosis not present

## 2018-09-06 NOTE — Telephone Encounter (Signed)
Copied from [place] [id number]. Topic: General - Other >> Aug 31, 2018 11:11 AM Lennox Solders wrote: Reason for CRM: pt saw dr crawford yesterday and would like order fax to [facility] for a portable oxygen that she can wear on her side. [facility] fax [phone] >> Sep 06, 2018  9:16 AM Ahmed Prima L wrote: Patient states that [facility] delivered a oxygen tank to her, she refused it because it was not the one she requested. She asked for the one that you wear. Call back @ [phone]

## 2018-09-06 NOTE — Telephone Encounter (Signed)
Copied from [place] [id number]. Topic: Quick Communication - Rx Refill/Question >> Sep 06, 2018 10:27 AM Yvette Rack wrote: Medication: atorvastatin (LIPITOR) 20 MG tablet  Has the patient contacted their pharmacy? Yes.  Tranfer pt to pharmacy she spoke with someone at pharmacy when she went  (Agent: If no, request that the patient contact the pharmacy for the refill.) (Agent: If yes, when and what did the pharmacy advise?)they told her no fill on her medicine  Preferred Pharmacy (with phone number or street name): [id number] [address], [place] [phone] (Phone) [phone] (Fax)    Agent: Please be advised that RX refills may take up to 3 business days. We ask that you follow-up with your pharmacy.

## 2018-09-06 NOTE — Telephone Encounter (Signed)
Called patient and informed her that we do not know of where she can get the specific type of O2 machine she is wanting. Gave her the numbers to [facility] and [employer] to see if they have what she is looking for.

## 2018-09-06 NOTE — Telephone Encounter (Signed)
Have sent a message over to [name] Case from [facility] to see if we can get it figured out waiting for a message back

## 2018-09-06 NOTE — Telephone Encounter (Signed)
We have faxed them prescription so she needs to go through [facility] with problems with her oxygen

## 2018-09-07 ENCOUNTER — Telehealth: Payer: Self-pay | Admitting: Internal Medicine

## 2018-09-07 MED ORDER — ATORVASTATIN CALCIUM 20 MG PO TABS: ORAL_TABLET | ORAL | 0 refills | Status: DC

## 2018-09-07 NOTE — Telephone Encounter (Unsigned)
Copied from [place] [phone]. Topic: General - Other >> Sep 07, 2018  3:48 PM Carolyn Stare wrote:  Pt would like a call back change from [facility] to [facility] because she would like to get a portable oxgyen and [facility] does not offer.   She need a order for portable oxgyen sent over to [facility] fax number  [phone]

## 2018-09-07 NOTE — Telephone Encounter (Signed)
Requested Prescriptions  Pending Prescriptions Disp Refills  . atorvastatin (LIPITOR) 20 MG tablet 90 tablet 1    Sig: TAKE 1 TABLET BY MOUTH DAILY AT 6 PM     Cardiovascular:  Antilipid - Statins Failed - 09/07/2018  9:34 AM      Failed - Total Cholesterol in normal range and within 360 days    Cholesterol, Total  Date Value Ref Range Status  02/27/2017 146 100 - 199 mg/dL Final         Failed - LDL in normal range and within 360 days    LDL Calculated  Date Value Ref Range Status  02/27/2017 75 0 - 99 mg/dL Final         Failed - HDL in normal range and within 360 days    HDL  Date Value Ref Range Status  02/27/2017 48 >39 mg/dL Final         Failed - Triglycerides in normal range and within 360 days    Triglycerides  Date Value Ref Range Status  02/27/2017 114 0 - 149 mg/dL Final         Passed - Patient is not pregnant      Passed - Valid encounter within last 12 months    Recent Outpatient Visits          1 week ago Atrial fibrillation, unspecified type ([place])   [place], Elizabeth A, MD   3 weeks ago Acute on chronic respiratory failure with hypoxia [facility])   Chandler, Elizabeth A, MD   4 months ago Adjustment disorder with mixed anxiety and depressed mood   [place], [place], MD   5 months ago Hypothyroidism, unspecified type   Erwin, Doniphan, MD   9 months ago Paroxysmal atrial fibrillation [facility])   [place], Dickens, MD      Future Appointments            In 3 weeks Sharlet Salina, Real Cons, MD [place], [facility]

## 2018-09-08 NOTE — Telephone Encounter (Signed)
Patient informed that the Rx has been faxed to [facility]

## 2018-09-08 NOTE — Telephone Encounter (Signed)
Patient called again to check status of portable oxygen device and ask for Dr Sharlet Salina to please send an order to [place] so that she can get a portable oxygen device.   Patient Ph# [phone]

## 2018-09-10 DIAGNOSIS — J449 Chronic obstructive pulmonary disease, unspecified: Secondary | ICD-10-CM | POA: Diagnosis not present

## 2018-09-15 ENCOUNTER — Other Ambulatory Visit: Payer: Self-pay | Admitting: Cardiology

## 2018-09-19 ENCOUNTER — Emergency Department (HOSPITAL_COMMUNITY)
Admission: EM | Admit: 2018-09-19 | Discharge: 2018-09-19 | Disposition: A | Discharge status: Home / Self Care | Payer: Medicare Other | Attending: Emergency Medicine | Admitting: Emergency Medicine

## 2018-09-19 ENCOUNTER — Emergency Department (HOSPITAL_COMMUNITY): Payer: Medicare Other

## 2018-09-19 ENCOUNTER — Encounter (HOSPITAL_COMMUNITY): Payer: Self-pay | Admitting: Emergency Medicine

## 2018-09-19 DIAGNOSIS — R05 Cough: Secondary | ICD-10-CM | POA: Diagnosis not present

## 2018-09-19 DIAGNOSIS — I110 Hypertensive heart disease with heart failure: Secondary | ICD-10-CM | POA: Diagnosis not present

## 2018-09-19 DIAGNOSIS — Z79899 Other long term (current) drug therapy: Secondary | ICD-10-CM | POA: Diagnosis not present

## 2018-09-19 DIAGNOSIS — E119 Type 2 diabetes mellitus without complications: Secondary | ICD-10-CM | POA: Insufficient documentation

## 2018-09-19 DIAGNOSIS — I4891 Unspecified atrial fibrillation: Secondary | ICD-10-CM | POA: Diagnosis not present

## 2018-09-19 DIAGNOSIS — E785 Hyperlipidemia, unspecified: Secondary | ICD-10-CM | POA: Insufficient documentation

## 2018-09-19 DIAGNOSIS — J441 Chronic obstructive pulmonary disease with (acute) exacerbation: Secondary | ICD-10-CM | POA: Insufficient documentation

## 2018-09-19 DIAGNOSIS — Z7901 Long term (current) use of anticoagulants: Secondary | ICD-10-CM | POA: Insufficient documentation

## 2018-09-19 DIAGNOSIS — I509 Heart failure, unspecified: Secondary | ICD-10-CM | POA: Diagnosis not present

## 2018-09-19 DIAGNOSIS — E039 Hypothyroidism, unspecified: Secondary | ICD-10-CM | POA: Insufficient documentation

## 2018-09-19 LAB — CBC WITH DIFFERENTIAL/PLATELET
Abs Immature Granulocytes: 0.02 10*3/uL (ref 0.00–0.07)
Basophils Absolute: 0.1 10*3/uL (ref 0.0–0.1)
Basophils Relative: 1 %
EOS PCT: 3 %
Eosinophils Absolute: 0.2 10*3/uL (ref 0.0–0.5)
HCT: 35.3 % — ABNORMAL LOW (ref 36.0–46.0)
Hemoglobin: 11.2 g/dL — ABNORMAL LOW (ref 12.0–15.0)
Immature Granulocytes: 0 %
Lymphocytes Relative: 28 %
Lymphs Abs: 1.7 10*3/uL (ref 0.7–4.0)
MCH: 32 pg (ref 26.0–34.0)
MCHC: 31.7 g/dL (ref 30.0–36.0)
MCV: 100.9 fL — ABNORMAL HIGH (ref 80.0–100.0)
Monocytes Absolute: 0.6 10*3/uL (ref 0.1–1.0)
Monocytes Relative: 9 %
Neutro Abs: 3.4 10*3/uL (ref 1.7–7.7)
Neutrophils Relative %: 59 %
Platelets: 204 10*3/uL (ref 150–400)
RBC: 3.5 MIL/uL — ABNORMAL LOW (ref 3.87–5.11)
RDW: 13.7 % (ref 11.5–15.5)
WBC: 5.9 10*3/uL (ref 4.0–10.5)
nRBC: 0 % (ref 0.0–0.2)

## 2018-09-19 LAB — I-STAT CG4 LACTIC ACID, ED
Lactic Acid, Venous: 0.92 mmol/L (ref 0.5–1.9)
Lactic Acid, Venous: 0.45 mmol/L — ABNORMAL LOW (ref 0.5–1.9)

## 2018-09-19 LAB — BRAIN NATRIURETIC PEPTIDE: B Natriuretic Peptide: 199.3 pg/mL — ABNORMAL HIGH (ref 0.0–100.0)

## 2018-09-19 MED ORDER — IPRATROPIUM-ALBUTEROL 0.5-2.5 (3) MG/3ML IN SOLN
3.0000 mL | Freq: Once | RESPIRATORY_TRACT | Status: AC
  Administered 2018-09-19: 3 mL via RESPIRATORY_TRACT
  Filled 2018-09-19: qty 3

## 2018-09-19 MED ORDER — PREDNISONE 20 MG PO TABS: ORAL_TABLET | ORAL | 0 refills | Status: DC

## 2018-09-19 MED ORDER — PREDNISONE 20 MG PO TABS
60.0000 mg | ORAL_TABLET | Freq: Once | ORAL | Status: AC
  Administered 2018-09-19: 60 mg via ORAL
  Filled 2018-09-19: qty 3

## 2018-09-19 NOTE — ED Triage Notes (Signed)
Patient reports that she has had a cough for 3 days and cannot breath. Denies chest pain. Hx. COPD, CHF, and uses 2 lit [employer] at home

## 2018-09-19 NOTE — ED Provider Notes (Signed)
[place] EMERGENCY DEPARTMENT Provider Note   CSN: [id number] Arrival date & time: 09/19/18  1626     History   Chief Complaint No chief complaint on file.   HPI [name] Case is a 85 y.o. female.  HPI Patient reports she started developing a very harsh cough 3 days ago.  Patient denies any chest pain.  She denies she feels more short of breath than normal.  She reports at baseline she wears 2 L nasal cannula oxygen at all times.  She reports the cough has been productive of yellow sputum.  She reports she has had a lot of nighttime cough.  Patient is a non-smoker.  She reports her legs always have some swelling and she does not noted to be worse than typically.  Patient does have a nebulizer machine at home but reports that she does not use it regularly.  Her granddaughter who is with her reports that the patient has Xopenex and she believes ipratropium at home to use but she rarely uses it although she reports she is supposed to use it more often if she has coughing or wheezing.  She has not tried it for the symptoms.  Patient has history of chronic atrial fibrillation.  Her granddaughter reports that if she uses much albuterol her heart rate will go up pretty high. Past Medical History:  Diagnosis Date  . Allergy   . Arthritis   . Atrial fibrillation ([place])   . Bronchitis   . Cancer ([place])    Bilateral upper tract transitional cell carcinoma  . Cardiomegaly   . CHF (congestive heart failure) ([place])   . Complication of anesthesia    Difficult to arouse  . COPD (chronic obstructive pulmonary disease) (HCC)    borderline   pt. denies at preop  . Diabetes mellitus without complication ([place])    Controlled by diet and exercise  . Diverticulosis of sigmoid colon   . Dyspnea    at times  . Dysrhythmia    a fib  . Family history of bladder cancer   . Family history of colon cancer   . Family history of kidney cancer   . Fatty liver   . GERD (gastroesophageal  reflux disease)   . History of hiatal hernia 01/20/2015   Small sliding, noted on CT  . History of kidney stones   . Hyperlipidemia   . Hypertension   . Hypothyroidism   . Lynch syndrome   . PONV (postoperative nausea and vomiting)   . Supraumbilical hernia 43/15/4008   Small noted on CT  . Thyroid disease   . UTI (urinary tract infection)     Patient Active Problem List   Diagnosis Date Noted  . COPD with acute exacerbation ([place]) 07/30/2018  . Lynch syndrome 05/05/2018  . Genetic testing 04/28/2018  . Adjustment disorder 04/23/2018  . Urothelial cancer ([place]) 04/12/2018  . Lichen planus 67/61/9509  . Obesity hypoventilation syndrome ([place])   . Hypokalemia   . Chronic CHF (congestive heart failure) ([place]) 01/09/2016  . History of snoring 05/20/2015  . Chronic respiratory failure with hypoxia ([place])   . Type 2 diabetes mellitus without complication ([place])   . Atrial fibrillation ([place]) 01/06/2015  . Hypothyroidism 03/09/2007  . Hyperlipidemia 03/09/2007  . Morbid obesity ([place]) 03/09/2007  . Essential hypertension 03/09/2007  . ALLERGIC RHINITIS 03/09/2007  . Asthma 03/09/2007  . GERD 03/09/2007  . INCONTINENCE 03/09/2007    Past Surgical History:  Procedure Laterality Date  . ABDOMINAL  HYSTERECTOMY    . APPENDECTOMY    . CHOLECYSTECTOMY    . COLONOSCOPY  07/16/2009  . CYSTOSCOPY W/ URETERAL STENT PLACEMENT Right 12/04/2017   Procedure: CYSTOSCOPY WITH RETROGRADE PYELOGRAM/URETERAL STENT PLACEMENT;  Surgeon: Festus Aloe, MD;  Location: [facility];  Service: Urology;  Laterality: Right;  . CYSTOSCOPY W/ URETERAL STENT PLACEMENT Right 02/26/2018   Procedure: CYSTOSCOPY WITH RETROGRADE PYELOGRAM/URETERAL STENT PLACEMENT;  Surgeon: Kathie Rhodes, MD;  Location: [facility];  Service: Urology;  Laterality: Right;  . CYSTOSCOPY WITH URETEROSCOPY AND STENT PLACEMENT Bilateral 01/29/2018   Procedure: BILATERAL URETEROSCOPY AND TUMOR RESECTION  WITH LASER RIGHT STENT EXCHANGE LEFT STENT  PLACEMENT;  Surgeon: Ardis Hughs, MD;  Location: [facility];  Service: Urology;  Laterality: Bilateral;  . CYSTOSCOPY WITH URETEROSCOPY AND STENT PLACEMENT Bilateral 02/10/2018   Procedure: BILATERAL URETEROSCOPY AND TUMOR EXCISION BILATERAL STENT EXCHANGE;  Surgeon: Ardis Hughs, MD;  Location: [facility];  Service: Urology;  Laterality: Bilateral;  . CYSTOSCOPY/RETROGRADE/URETEROSCOPY Bilateral 12/24/2017   Procedure: BILATERAL URETEROSCOPY, RIGHT STENT EXCHANGE, STONE EXTRACTION WITH BASKET BILATERAL RETROGRADE PYELOGRAM, BIOPSY OF RIGHT UPPER POLE TUMOR,  BILATERAL RENAL PELVIS FLUID SENT FOR CYTOLOGY;  Surgeon: Ardis Hughs, MD;  Location: [facility];  Service: Urology;  Laterality: Bilateral;  . CYSTOSCOPY/URETEROSCOPY/HOLMIUM LASER/STENT PLACEMENT Bilateral 06/03/2018   Procedure: CYSTOSCOPY/BILATERAL URETEROSCOPY/ LASER TUMOR ABLATION BILATERAL / BILATERAL STENT PLACEMENT/BILATERAL RETROGRADE PYELOGRAM/BLADDER BIOPSY WITH FULGURATION;  Surgeon: Ardis Hughs, MD;  Location: [facility];  Service: Urology;  Laterality: Bilateral;  . HOLMIUM LASER APPLICATION Bilateral 81/0/1751   Procedure: HOLMIUM LASER APPLICATION;  Surgeon: Ardis Hughs, MD;  Location: [facility];  Service: Urology;  Laterality: Bilateral;  . KNEE SURGERY Right   . KNEE SURGERY Left    torn ligament repair  . UPPER GI ENDOSCOPY    . ureteroscopy laser tumor ablation bilateral stent placement     Dr. Louis Meckel 06-03-18      OB History   No obstetric history on file.      Home Medications    Prior to Admission medications   Medication Sig Start Date End Date Taking? Authorizing Provider  acetaminophen (TYLENOL) 500 MG tablet Take 500-1,000 mg by mouth every 8 (eight) hours as needed for moderate pain or headache.     [provider]  apixaban (ELIQUIS) 5 MG TABS tablet Take 1 tablet (5 mg total) by mouth 2 (two) times daily. Resume once the bleeding has stopped from the surgery for 2  days. Patient taking differently: Take 5 mg by mouth 2 (two) times daily.  02/10/18   Ardis Hughs, MD  atorvastatin (LIPITOR) 20 MG tablet TAKE 1 TABLET BY MOUTH DAILY AT 6 PM 09/07/18   Hoyt Koch, MD  diltiazem (CARDIZEM CD) 180 MG 24 hr capsule TAKE 1 CAPSULE BY MOUTH DAILY Patient taking differently: Take 180 mg by mouth daily.  04/23/18   Hoyt Koch, MD  furosemide (LASIX) 40 MG tablet Take 1 tablet (40 mg total) by mouth daily. 08/04/18 08/04/19  Georgette Shell, MD  ipratropium (ATROVENT) 0.02 % nebulizer solution Take 5 mLs (1 mg total) by nebulization every 6 (six) hours as needed for wheezing or shortness of breath. 08/04/18   Georgette Shell, MD  irbesartan (AVAPRO) 150 MG tablet Take 0.5 tablets (75 mg total) by mouth daily. 07/31/18   Caren Griffins, MD  levalbuterol Penne Lash) 0.63 MG/3ML nebulizer solution Take 3 mLs (0.63 mg total) by nebulization every 6 (six) hours as needed for wheezing  or shortness of breath. 08/04/18   Georgette Shell, MD  levothyroxine (SYNTHROID, LEVOTHROID) 100 MCG tablet Take 1 tablet (100 mcg total) by mouth daily. Patient taking differently: Take 100 mcg by mouth daily before breakfast.  03/15/18   Hoyt Koch, MD  metoprolol succinate (TOPROL-XL) 50 MG 24 hr tablet TAKE 1 TABLET BY MOUTH DAILY WITH OR IMMEDIATELY FOLLOWING A MEALPlease make overdue appt with Dr. Marlou Porch before anymore refills.1st attempt Patient taking differently: Take 50 mg by mouth daily. TAKE 1 TABLET BY MOUTH DAILY WITH OR IMMEDIATELY FOLLOWING A MEALPlease make overdue appt with Dr. Marlou Porch before anymore refills.1st attempt 08/23/18   Jerline Pain, MD  potassium chloride SA (K-DUR,KLOR-CON) 20 MEQ tablet Take 1 tablet (20 mEq total) by mouth daily. 08/12/18   Hoyt Koch, MD  predniSONE (DELTASONE) 20 MG tablet 2 tabs po daily x 3 days 09/19/18   Charlesetta Shanks, MD  traMADol (ULTRAM) 50 MG tablet Take 1-2 tablets (50-100 mg  total) by mouth every 6 (six) hours as needed for moderate pain. 06/03/18   Ardis Hughs, MD  umeclidinium-vilanterol (ANORO ELLIPTA) 62.5-25 MCG/INH AEPB Inhale 1 puff into the lungs daily. 08/30/18   Hoyt Koch, MD    Family History Family History  Problem Relation Age of Onset  . Heart disease Mother   . Colon cancer Mother 40       d. 36  . Tuberculosis Father   . Bladder Cancer Brother        urothelial cancer  . Bladder Cancer Brother        ureter cancer  . Kidney cancer Brother   . Skin cancer Sister   . Cancer Maternal Aunt        NOS  . Colon cancer Other        dx 1st time under 45, second time at 60  . Lung cancer Other   . Muscular dystrophy Other        d. 93  . Testicular cancer Other 24       great nephew  . Alcohol abuse Daughter   . Drug abuse Daughter   . Schizophrenia Daughter   . Colon cancer Grandchild     Social History Social History   Tobacco Use  . Smoking status: Never Smoker  . Smokeless tobacco: Never Used  Substance Use Topics  . Alcohol use: No    Alcohol/week: 0.0 standard drinks  . Drug use: No     Allergies   Codeine   Review of Systems Review of Systems 10 Systems reviewed and are negative for acute change except as noted in the HPI.   Physical Exam Updated Vital Signs BP (!) 108/57 (BP Location: Left Arm)   Pulse 80   Temp 98.5 F (36.9 C) (Oral)   Resp 16   LMP  (LMP Unknown)   SpO2 96%   Physical Exam Constitutional:      Appearance: Normal appearance.     Comments: No respiratory distress  HENT:     Mouth/Throat:     Mouth: Mucous membranes are moist.  Eyes:     Extraocular Movements: Extraocular movements intact.  Cardiovascular:     Rate and Rhythm: Normal rate. Rhythm irregular.     Comments: Heart is irregularly irregular with rate in the 60s to 70s.  No gross rub murmur gallop. Pulmonary:     Comments: No respiratory distress at rest.  Patient intermittently has harsh productive  sounding cough.  Breath sounds  are soft at the bases bilaterally.  Scattered fine expiratory wheeze.  No rales. Abdominal:     General: There is no distension.     Palpations: Abdomen is soft.     Tenderness: There is no abdominal tenderness. There is no guarding.  Musculoskeletal: Normal range of motion.     Comments: Trace edema bilaterally.  Calves soft and nontender.  Skin condition good.  Skin:    General: Skin is warm and dry.  Neurological:     General: No focal deficit present.     Mental Status: She is alert and oriented to person, place, and time.     Coordination: Coordination normal.  Psychiatric:        Mood and Affect: Mood normal.      ED Treatments / Results  Labs (all labs ordered are listed, but only abnormal results are displayed) Labs Reviewed  CBC WITH DIFFERENTIAL/PLATELET - Abnormal; Notable for the following components:      Result Value   RBC 3.50 (*)    Hemoglobin 11.2 (*)    HCT 35.3 (*)    MCV 100.9 (*)    All other components within normal limits  BRAIN NATRIURETIC PEPTIDE - Abnormal; Notable for the following components:   B Natriuretic Peptide 199.3 (*)    All other components within normal limits  I-STAT CG4 LACTIC ACID, ED  I-STAT CG4 LACTIC ACID, ED    EKG EKG Interpretation  Date/Time:  Sunday September 19 2018 17:24:30 EST Ventricular Rate:  79 PR Interval:    QRS Duration: 93 QT Interval:  459 QTC Calculation: 527 R Axis:   63 Text Interpretation:  Atrial fibrillation Low voltage, precordial leads Borderline T abnormalities, anterior leads Prolonged QT interval artifact with wandering baseline. no change from previous Confirmed by Charlesetta Shanks [phone]) on 09/19/2018 7:49:32 PM   Radiology Dg Chest 2 View  Result Date: 09/19/2018 CLINICAL DATA:  Cough for 3 days EXAM: CHEST - 2 VIEW COMPARISON:  August 25, 2018 FINDINGS: The heart size and mediastinal contours are stable. The heart size is enlarged. There is no focal  infiltrate, pulmonary edema, or pleural effusion. The visualized skeletal structures are stable. IMPRESSION: No active cardiopulmonary disease. Electronically Signed   By: Abelardo Diesel M.D.   On: 09/19/2018 18:27    Procedures Procedures (including critical care time)  Medications Ordered in ED Medications  ipratropium-albuterol (DUONEB) 0.5-2.5 (3) MG/3ML nebulizer solution 3 mL (3 mLs Nebulization Given 09/19/18 1722)  predniSONE (DELTASONE) tablet 60 mg (60 mg Oral Given 09/19/18 2018)     Initial Impression / Assessment and Plan / ED Course  I have reviewed the triage vital signs and the nursing notes.  Pertinent labs & imaging results that were available during my care of the patient were reviewed by me and considered in my medical decision making (see chart for details).    Patient is alert and appropriate.  She does not have respiratory distress at rest.  Symptoms have been predominantly cough over 3 days duration.  Chest x-ray does not show an acute pneumonia.  Patient denies chest pain or pleurisy.  She chronically is oxygen dependent.  No increased short of breath compared to baseline.  Patient does not have leukocytosis or anemia.  BNP is close to baseline without clinical signs of significant CHF.  At this time I feel patient stable to treat for COPD exacerbation without immediate complications.  Return precautions and follow-up plan reviewed.  Final Clinical Impressions(s) / ED Diagnoses  Final diagnoses:  COPD with acute exacerbation [facility])    ED Discharge Orders         Ordered    predniSONE (DELTASONE) 20 MG tablet     09/19/18 2014           Charlesetta Shanks, MD 09/19/18 2022

## 2018-09-19 NOTE — ED Notes (Signed)
Patient transported to X-ray 

## 2018-09-19 NOTE — Discharge Instructions (Addendum)
1.  Use your Xopenex every 4-6 hours for the next 2 to 3 days until your symptoms are improving.  Use ipratropium (Atrovent) 3 times daily in your nebulizer machine. 2.  Start prednisone tomorrow evening.  Your first dose was given in the emergency department. 3.  Return to the emergency department if your symptoms are worsening or new concerning symptoms are developing.

## 2018-09-23 ENCOUNTER — Other Ambulatory Visit: Payer: Self-pay | Admitting: *Deleted

## 2018-09-23 ENCOUNTER — Telehealth: Payer: Self-pay | Admitting: Cardiology

## 2018-09-23 MED ORDER — METOPROLOL SUCCINATE ER 50 MG PO TB24: ORAL_TABLET | ORAL | 0 refills | Status: DC

## 2018-09-23 NOTE — Telephone Encounter (Signed)
°*  STAT* If patient is at the pharmacy, call can be transferred to refill team.   1. Which medications need to be refilled? (please list name of each medication and dose if known)  Metoprolol  2. Which pharmacy/location (including street and city if local pharmacy) is medication to be sent to? [phone]  3. Do they need a 30 day or 90 day supply? enough until her appt on  10-11-18- first available appt we had

## 2018-09-27 ENCOUNTER — Ambulatory Visit: Payer: Medicare Other | Admitting: Internal Medicine

## 2018-09-29 ENCOUNTER — Ambulatory Visit (INDEPENDENT_AMBULATORY_CARE_PROVIDER_SITE_OTHER): Payer: Medicare Other | Admitting: Internal Medicine

## 2018-09-29 ENCOUNTER — Encounter: Payer: Self-pay | Admitting: Internal Medicine

## 2018-09-29 DIAGNOSIS — J4530 Mild persistent asthma, uncomplicated: Secondary | ICD-10-CM

## 2018-09-29 DIAGNOSIS — J9611 Chronic respiratory failure with hypoxia: Secondary | ICD-10-CM

## 2018-09-29 MED ORDER — BENZONATATE 200 MG PO CAPS: 200.0000 mg | ORAL_CAPSULE | Freq: Three times a day (TID) | ORAL | 0 refills | Status: DC | PRN

## 2018-09-29 NOTE — Patient Instructions (Signed)
We have sent in a cough medicine to use up to 3 times per day to help with cough.  We will see you back in about 1 month to see if we can stop the oxygen.

## 2018-09-29 NOTE — Progress Notes (Signed)
   Subjective:   Patient ID: [name] Case, female    DOB: 08-23-1932, 85 y.o.   MRN: [id number]  HPI The patient is an 85 YO female coming in for ER follow up (in for COPD exacerbation with more coughing, CXR negative, given 3 day prednisone course). This was about 1-2 weeks ago. Is off prednisone currently. Denies SOB at rest. Some SOB with exertion but feels that endurance is overall increasing. Denies fevers or chills. Cough still with some sputum production but not changed in color or quantity. She denies any benefit to taking the prednisone that she noticed. Denies nasal dripping. Denies sinus pressure or symptoms. Got portable oxygen so now using 2L all the time with device. She is not a smoker. She is only using nebulizers if needed and only about 1-2 times per week now from daily before. Having worsening knee pain right especially and has bone on bone arthritis there and shots in the past but more than 1 year ago.   PMH, [facility], social history reviewed and updated.   Review of Systems  Constitutional: Negative.   HENT: Negative.   Eyes: Negative.   Respiratory: Positive for cough and shortness of breath. Negative for chest tightness.   Cardiovascular: Negative for chest pain, palpitations and leg swelling.  Gastrointestinal: Negative for abdominal distention, abdominal pain, constipation, diarrhea, nausea and vomiting.  Musculoskeletal: Positive for arthralgias.  Skin: Negative.   Neurological: Negative.   Psychiatric/Behavioral: Negative.     Objective:  Physical Exam Constitutional:      Appearance: She is well-developed. She is obese.  HENT:     Head: Normocephalic and atraumatic.  Neck:     Musculoskeletal: Normal range of motion.  Cardiovascular:     Rate and Rhythm: Normal rate and regular rhythm.     Comments: Oxygen 2 L Pulmonary:     Effort: Pulmonary effort is normal. No respiratory distress.     Breath sounds: Normal breath sounds. No wheezing or rales.    Abdominal:     General: Bowel sounds are normal. There is no distension.     Palpations: Abdomen is soft.     Tenderness: There is no abdominal tenderness. There is no rebound.  Skin:    General: Skin is warm and dry.  Neurological:     Mental Status: She is alert and oriented to person, place, and time.     Coordination: Coordination abnormal.     Comments: Cane for balance     Vitals:   09/29/18 1035  BP: 110/60  Pulse: 81  Temp: 97.6 F (36.4 C)  TempSrc: Oral  SpO2: 98%  Weight: 226 lb (102.5 kg)  Height: 5' (1.524 m)    Assessment & Plan:

## 2018-10-01 ENCOUNTER — Other Ambulatory Visit: Payer: Self-pay | Admitting: Internal Medicine

## 2018-10-01 MED ORDER — FLUCONAZOLE 150 MG PO TABS: 150.0000 mg | ORAL_TABLET | Freq: Once | ORAL | 0 refills | Status: AC

## 2018-10-01 NOTE — Assessment & Plan Note (Signed)
Appears to be improving clinically but will wait another month since recent flare and assess with walk test for need for oxygen with movement. She has been on O2 at night for some time and do not feel that this can be stopped. She is using albuterol nebulizer less often. Her activity level is not high and she declines pulmonary rehab at this time.

## 2018-10-01 NOTE — Assessment & Plan Note (Addendum)
She does likely have some COPD now from chronic asthma with flares over lifetime. She is currently on 2 L O2 all the time. She had recent flare with steroids in the last month so will wait until next visit for assessment of O2 needs. Rx for tessalon perles for cough.

## 2018-10-01 NOTE — Telephone Encounter (Signed)
Copied from [place] [phone]. Topic: Quick Communication - Rx Refill/Question >> Oct 01, 2018 11:12 AM Alanda Slim E wrote: Medication: clotrimazole-betamethasone (LOTRISONE) cream (Pt has a yeast infection)   Has the patient contacted their pharmacy? No - hasnt needed Rx in a while  (Agent: If no, request that the patient contact the pharmacy for the refill.) (Agent: If yes, when and what did the pharmacy advise?)  Preferred Pharmacy (with phone number or street name): [id number] [address], [place]. [phone] (Phone) [phone] (Fax)    Agent: Please be advised that RX refills may take up to 3 business days. We ask that you follow-up with your pharmacy.

## 2018-10-01 NOTE — Telephone Encounter (Signed)
We need more information. This is a topical cream and notes states yeast infection. Are they referring to vaginal yeast infection or skin infection (if so where)?

## 2018-10-01 NOTE — Telephone Encounter (Signed)
Patient advised of dr crawfords note, she will pick med up at [facility]

## 2018-10-01 NOTE — Telephone Encounter (Signed)
Sent in diflucan for yeast infection.

## 2018-10-01 NOTE — Telephone Encounter (Signed)
Called patient and she stated it is a vaginal yeast infection and this is the cream she was prescribed before for it had the rx in front of her and named it

## 2018-10-11 ENCOUNTER — Encounter: Payer: Self-pay | Admitting: Physician Assistant

## 2018-10-11 ENCOUNTER — Ambulatory Visit: Payer: Medicare Other | Admitting: Physician Assistant

## 2018-10-11 VITALS — BP 100/58 | HR 93 | Ht 60.0 in | Wt 226.1 lb

## 2018-10-11 DIAGNOSIS — E785 Hyperlipidemia, unspecified: Secondary | ICD-10-CM | POA: Diagnosis not present

## 2018-10-11 DIAGNOSIS — J449 Chronic obstructive pulmonary disease, unspecified: Secondary | ICD-10-CM | POA: Diagnosis not present

## 2018-10-11 DIAGNOSIS — I4821 Permanent atrial fibrillation: Secondary | ICD-10-CM

## 2018-10-11 DIAGNOSIS — I5031 Acute diastolic (congestive) heart failure: Secondary | ICD-10-CM | POA: Diagnosis not present

## 2018-10-11 MED ORDER — FUROSEMIDE 40 MG PO TABS: 40.0000 mg | ORAL_TABLET | Freq: Every day | ORAL | 11 refills | Status: DC

## 2018-10-11 MED ORDER — FUROSEMIDE 40 MG PO TABS: 40.0000 mg | ORAL_TABLET | ORAL | 11 refills | Status: DC | PRN

## 2018-10-11 NOTE — Progress Notes (Signed)
Cardiology Office Note    Date:  10/11/2018   ID:  [name] Case, DOB 17-Dec-1931, MRN [id number]  PCP:  Hoyt Koch, MD  Cardiologist: Dr. Marlou Porch  Chief Complaint:12 Months follow up  History of Present Illness:   [name] Case is a 85 y.o. female with hx of permanent atrial fibrillation, HTN, HLD,  COPD and thyroid disease presents for follow up.   Hx of permanent atrial fibrillation with  dilated left atrium 47 mm. On Eliquis for anticoagulation. Last seen by Dr. Marlou Porch 08/2017.  Treated with prednisone of recent COPD exacerbation 09/2018.  Here today for follow-up.  For the past 6 weeks patient has noted gradually worsening of lower extremity edema with 6 pound weight gain.  No chest pain.  On oxygen chronically due to COPD.  No exacerbation of dyspnea.  Denies palpitation or melena.  Sleeps on recliner chronically.  No melena or syncope.  Compliant with low-sodium diet.  Past Medical History:  Diagnosis Date  . Allergy   . Arthritis   . Atrial fibrillation ([place])   . Bronchitis   . Cancer ([place])    Bilateral upper tract transitional cell carcinoma  . Cardiomegaly   . CHF (congestive heart failure) ([place])   . Complication of anesthesia    Difficult to arouse  . COPD (chronic obstructive pulmonary disease) ([facility])    borderline   pt. denies at preop  . Diabetes mellitus without complication ([place])    Controlled by diet and exercise  . Diverticulosis of sigmoid colon   . Dyspnea    at times  . Dysrhythmia    a fib  . Family history of bladder cancer   . Family history of colon cancer   . Family history of kidney cancer   . Fatty liver   . GERD (gastroesophageal reflux disease)   . History of hiatal hernia 01/20/2015   Small sliding, noted on CT  . History of kidney stones   . Hyperlipidemia   . Hypertension   . Hypothyroidism   . Lynch syndrome   . PONV (postoperative nausea and vomiting)   . Supraumbilical hernia 74/08/8785   Small noted on CT  .  Thyroid disease   . UTI (urinary tract infection)     Past Surgical History:  Procedure Laterality Date  . ABDOMINAL HYSTERECTOMY    . APPENDECTOMY    . CHOLECYSTECTOMY    . COLONOSCOPY  07/16/2009  . CYSTOSCOPY W/ URETERAL STENT PLACEMENT Right 12/04/2017   Procedure: CYSTOSCOPY WITH RETROGRADE PYELOGRAM/URETERAL STENT PLACEMENT;  Surgeon: Festus Aloe, MD;  Location: [facility];  Service: Urology;  Laterality: Right;  . CYSTOSCOPY W/ URETERAL STENT PLACEMENT Right 02/26/2018   Procedure: CYSTOSCOPY WITH RETROGRADE PYELOGRAM/URETERAL STENT PLACEMENT;  Surgeon: Kathie Rhodes, MD;  Location: [facility];  Service: Urology;  Laterality: Right;  . CYSTOSCOPY WITH URETEROSCOPY AND STENT PLACEMENT Bilateral 01/29/2018   Procedure: BILATERAL URETEROSCOPY AND TUMOR RESECTION  WITH LASER RIGHT STENT EXCHANGE LEFT STENT PLACEMENT;  Surgeon: Ardis Hughs, MD;  Location: [facility];  Service: Urology;  Laterality: Bilateral;  . CYSTOSCOPY WITH URETEROSCOPY AND STENT PLACEMENT Bilateral 02/10/2018   Procedure: BILATERAL URETEROSCOPY AND TUMOR EXCISION BILATERAL STENT EXCHANGE;  Surgeon: Ardis Hughs, MD;  Location: [facility];  Service: Urology;  Laterality: Bilateral;  . CYSTOSCOPY/RETROGRADE/URETEROSCOPY Bilateral 12/24/2017   Procedure: BILATERAL URETEROSCOPY, RIGHT STENT EXCHANGE, STONE EXTRACTION WITH BASKET BILATERAL RETROGRADE PYELOGRAM, BIOPSY OF RIGHT UPPER POLE TUMOR,  BILATERAL RENAL PELVIS FLUID SENT FOR CYTOLOGY;  Surgeon:  Ardis Hughs, MD;  Location: [facility];  Service: Urology;  Laterality: Bilateral;  . CYSTOSCOPY/URETEROSCOPY/HOLMIUM LASER/STENT PLACEMENT Bilateral 06/03/2018   Procedure: CYSTOSCOPY/BILATERAL URETEROSCOPY/ LASER TUMOR ABLATION BILATERAL / BILATERAL STENT PLACEMENT/BILATERAL RETROGRADE PYELOGRAM/BLADDER BIOPSY WITH FULGURATION;  Surgeon: Ardis Hughs, MD;  Location: [facility];  Service: Urology;  Laterality: Bilateral;  . HOLMIUM LASER APPLICATION Bilateral 16/09/958    Procedure: HOLMIUM LASER APPLICATION;  Surgeon: Ardis Hughs, MD;  Location: [facility];  Service: Urology;  Laterality: Bilateral;  . KNEE SURGERY Right   . KNEE SURGERY Left    torn ligament repair  . UPPER GI ENDOSCOPY    . ureteroscopy laser tumor ablation bilateral stent placement     Dr. Louis Meckel 06-03-18     Current Medications: Prior to Admission medications   Medication Sig Start Date End Date Taking? Authorizing Provider  acetaminophen (TYLENOL) 500 MG tablet Take 500-1,000 mg by mouth every 8 (eight) hours as needed for moderate pain or headache.     [provider]  apixaban (ELIQUIS) 5 MG TABS tablet Take 1 tablet (5 mg total) by mouth 2 (two) times daily. Resume once the bleeding has stopped from the surgery for 2 days. Patient taking differently: Take 5 mg by mouth 2 (two) times daily.  02/10/18   Ardis Hughs, MD  atorvastatin (LIPITOR) 20 MG tablet TAKE 1 TABLET BY MOUTH DAILY AT 6 PM 09/07/18   Hoyt Koch, MD  benzonatate (TESSALON) 200 MG capsule Take 1 capsule (200 mg total) by mouth 3 (three) times daily as needed. 09/29/18   Hoyt Koch, MD  diltiazem (CARDIZEM CD) 180 MG 24 hr capsule TAKE 1 CAPSULE BY MOUTH DAILY Patient taking differently: Take 180 mg by mouth daily.  04/23/18   Hoyt Koch, MD  furosemide (LASIX) 40 MG tablet Take 1 tablet (40 mg total) by mouth daily. 08/04/18 08/04/19  Georgette Shell, MD  ipratropium (ATROVENT) 0.02 % nebulizer solution Take 5 mLs (1 mg total) by nebulization every 6 (six) hours as needed for wheezing or shortness of breath. 08/04/18   Georgette Shell, MD  irbesartan (AVAPRO) 150 MG tablet Take 0.5 tablets (75 mg total) by mouth daily. 07/31/18   Caren Griffins, MD  levalbuterol Penne Lash) 0.63 MG/3ML nebulizer solution Take 3 mLs (0.63 mg total) by nebulization every 6 (six) hours as needed for wheezing or shortness of breath. 08/04/18   Georgette Shell, MD  levothyroxine  (SYNTHROID, LEVOTHROID) 100 MCG tablet Take 1 tablet (100 mcg total) by mouth daily. Patient taking differently: Take 100 mcg by mouth daily before breakfast.  03/15/18   Hoyt Koch, MD  metoprolol succinate (TOPROL-XL) 50 MG 24 hr tablet TAKE 1 TABLET BY MOUTH DAILY WITH OR IMMEDIATELY FOLLOWING A MEAL. Please keep upcoming appt with Dr. Marlou Porch before anymore refill. 09/23/18   Jerline Pain, MD  potassium chloride SA (K-DUR,KLOR-CON) 20 MEQ tablet Take 1 tablet (20 mEq total) by mouth daily. 08/12/18   Hoyt Koch, MD  traMADol (ULTRAM) 50 MG tablet Take 1-2 tablets (50-100 mg total) by mouth every 6 (six) hours as needed for moderate pain. 06/03/18   Ardis Hughs, MD    Allergies:   Codeine   Social History   Socioeconomic History  . Marital status: Widowed    Spouse name: Not on file  . Number of children: Not on file  . Years of education: Not on file  . Highest education level: Not on file  Occupational History  . Occupation: retired  Scientific laboratory technician  . Financial resource strain: Not on file  . Food insecurity:    Worry: Not on file    Inability: Not on file  . Transportation needs:    Medical: Not on file    Non-medical: Not on file  Tobacco Use  . Smoking status: Never Smoker  . Smokeless tobacco: Never Used  Substance and Sexual Activity  . Alcohol use: No    Alcohol/week: 0.0 standard drinks  . Drug use: No  . Sexual activity: Not Currently    Birth control/protection: Surgical  Lifestyle  . Physical activity:    Days per week: Not on file    Minutes per session: Not on file  . Stress: Not on file  Relationships  . Social connections:    Talks on phone: Not on file    Gets together: Not on file    Attends religious service: Not on file    Active member of club or organization: Not on file    Attends meetings of clubs or organizations: Not on file    Relationship status: Not on file  Other Topics Concern  . Not on file  Social History  Narrative  . Not on file     Family History:  The patient's family history includes Alcohol abuse in her daughter; Bladder Cancer in her brother and brother; Cancer in her maternal aunt; Colon cancer in her grandchild and another family member; Colon cancer (age of onset: 56) in her mother; Drug abuse in her daughter; Heart disease in her mother; Kidney cancer in her brother; Lung cancer in an other family member; Muscular dystrophy in an other family member; Schizophrenia in her daughter; Skin cancer in her sister; Testicular cancer (age of onset: 29) in an other family member; Tuberculosis in her father.   ROS:   Please see the history of present illness.    ROS All other systems reviewed and are negative.   PHYSICAL EXAM:   VS:  BP (!) 100/58   Pulse 93   Ht 5' (1.524 m)   Wt 226 lb 1.9 oz (102.6 kg)   LMP  (LMP Unknown)   SpO2 97%   BMI 44.16 kg/m    GEN: Well nourished, well developed, in no acute distress  HEENT: normal  Neck: no JVD, carotid bruits, or masses Cardiac: RRR; no murmurs, rubs, or gallops, 1+ bilateral lower extremity edema Respiratory:  clear to auscultation bilaterally, normal work of breathing GI: soft, nontender, nondistended, + BS MS: no deformity or atrophy  Skin: warm and dry, no rash Neuro:  Alert and Oriented x 3, Strength and sensation are intact Psych: euthymic mood, full affect  Wt Readings from Last 3 Encounters:  10/11/18 226 lb 1.9 oz (102.6 kg)  09/29/18 226 lb (102.5 kg)  08/30/18 222 lb (100.7 kg)      Studies/Labs Reviewed:   EKG:  EKG is ordered today.   Recent Labs: 07/29/2018: ALT 19 08/02/2018: TSH 0.764 08/04/2018: Magnesium 1.9 08/25/2018: BUN 19; Creatinine, Ser 1.06; Potassium 5.0; Sodium 142 09/19/2018: B Natriuretic Peptide 199.3; Hemoglobin 11.2; Platelets 204   Lipid Panel    Component Value Date/Time   CHOL 146 02/27/2017 1000   TRIG 114 02/27/2017 1000   HDL 48 02/27/2017 1000   CHOLHDL 3.0 02/27/2017 1000    CHOLHDL 4 06/08/2015 1126   VLDL 29.4 06/08/2015 1126   LDLCALC 75 02/27/2017 1000   LDLDIRECT 188.6 07/19/2007 0000    Additional  studies/ records that were reviewed today include:   Echocardiogram: 12/2015 Study Conclusions  - Left ventricle: The cavity size was normal. Systolic function was   normal. The estimated ejection fraction was in the range of 50%   to 55%. Wall motion was normal; there were no regional wall   motion abnormalities. - Aortic valve: Transvalvular velocity was within the normal range.   There was no stenosis. There was no regurgitation. - Mitral valve: There was trivial regurgitation. - Left atrium: The atrium was mildly dilated. - Right ventricle: The cavity size was normal. Wall thickness was   normal. Systolic function was normal. - Tricuspid valve: There was trivial regurgitation. - Pulmonary arteries: Systolic pressure was within the normal   range. - Inferior vena cava: The vessel was normal in size. The   respirophasic diameter changes were in the normal range (>= 50%),   consistent with normal central venous pressure.   ASSESSMENT & PLAN:    1. Permanent atrial fibrillation -Rate controlled.  Continue beta-blocker, calcium channel blocker and Eliquis.  No bleeding issue.  2. HTN -Blood pressure stable on current medication.  3. COPD - recent exacerbation. Per primary.   4.  Presumed acute diastolic heart failure -BNP minimally elevated during recent ER visit and last month.  She has noted lower extremity edema and 6 pound weight gain in the past 6 weeks.  Will increase Lasix to 40 mg twice daily with supplemental potassium of 20 M EQ twice daily for 5 days and then back to daily dose and then take additional as needed medication.  Compliant with low-sodium diet.  Encourage daily weight.  She will call us if worsening of symptoms.    Medication Adjustments/Labs and Tests Ordered: Current medicines are reviewed at length with the patient  today.  Concerns regarding medicines are outlined above.  Medication changes, Labs and Tests ordered today are listed in the Patient Instructions below. Patient Instructions  Medication Instructions:  Your physician has recommended you make the following change in your medication:  1. Increase lasix to one tablet (40 mg) twice a day for 5 days than one tablet (40 mg) daily.  If after decrease to one tablet (40 mg) daily you have swelling you can take extra tablet. 2. Increase Potassium to one tablet ( 20 meq) twice a day for 5 days than one tablet (20 meq) daily.  If after decrease to one tablet (20 meq) daily you have swelling you can take extra tablet.    Labwork: -None  Testing/Procedures: -None  Follow-Up: Your physician wants you to follow-up in: 6 months with Dr.Skains.  You will receive a reminder letter in the mail two months in advance. If you don't receive a letter, please call our office to schedule the follow-up appointment.    Any Other Special Instructions Will Be Listed Below (If Applicable).     If you need a refill on your cardiac medications before your next appointment, please call your pharmacy.      Jarrett Soho, [place]  10/11/2018 3:55 PM    [facility] [address], [place], [place]  [address] Phone: [phone]; Fax: [phone]

## 2018-10-11 NOTE — Patient Instructions (Signed)
Medication Instructions:  Your physician has recommended you make the following change in your medication:  1. Increase lasix to one tablet (40 mg) twice a day for 5 days than one tablet (40 mg) daily.  If after decrease to one tablet (40 mg) daily you have swelling you can take extra tablet. 2. Increase Potassium to one tablet ( 20 meq) twice a day for 5 days than one tablet (20 meq) daily.  If after decrease to one tablet (20 meq) daily you have swelling you can take extra tablet.    Labwork: -None  Testing/Procedures: -None  Follow-Up: Your physician wants you to follow-up in: 6 months with Dr.Skains.  You will receive a reminder letter in the mail two months in advance. If you don't receive a letter, please call our office to schedule the follow-up appointment.    Any Other Special Instructions Will Be Listed Below (If Applicable).     If you need a refill on your cardiac medications before your next appointment, please call your pharmacy.

## 2018-10-14 ENCOUNTER — Encounter: Payer: Self-pay | Admitting: Family Medicine

## 2018-10-14 ENCOUNTER — Ambulatory Visit (INDEPENDENT_AMBULATORY_CARE_PROVIDER_SITE_OTHER): Payer: Medicare Other | Admitting: Family Medicine

## 2018-10-14 VITALS — BP 108/68 | HR 71 | Temp 97.5°F | Ht 60.0 in | Wt 230.0 lb

## 2018-10-14 DIAGNOSIS — H1031 Unspecified acute conjunctivitis, right eye: Secondary | ICD-10-CM

## 2018-10-14 MED ORDER — POLYMYXIN B-TRIMETHOPRIM 10000-0.1 UNIT/ML-% OP SOLN: 1.0000 [drp] | Freq: Four times a day (QID) | OPHTHALMIC | 0 refills | Status: DC

## 2018-10-14 NOTE — Patient Instructions (Signed)
Please use eye drops as directed four times a day.  You can also use an over the counter eye drop for itching that you have experienced. Naphcon A can be purchased over the counter.  If symptoms do not improve with treatment, worsen, or new symptoms develop, follow up.   Bacterial Conjunctivitis, Adult Bacterial conjunctivitis is an infection of the clear membrane that covers the white part of your eye and the inner surface of your eyelid (conjunctiva). When the blood vessels in your conjunctiva become inflamed, your eye becomes red or pink, and it will probably feel itchy. Bacterial conjunctivitis spreads very easily from person to person (is contagious). It also spreads easily from one eye to the other eye. What are the causes? This condition is caused by bacteria. You may get the infection if you come into close contact with:  A person who is infected with the bacteria.  Items that are contaminated with the bacteria, such as a face towel, contact lens solution, or eye makeup. What increases the risk? You are more likely to develop this condition if you:  Are exposed to other people who have the infection.  Wear contact lenses.  Have a sinus infection.  Have had a recent eye injury or surgery.  Have a weak body defense system (immune system).  Have a medical condition that causes dry eyes. What are the signs or symptoms? Symptoms of this condition include:  Thick, yellowish discharge from the eye. This may turn into a crust on the eyelid overnight and cause your eyelids to stick together.  Tearing or watery eyes.  Itchy eyes.  Burning feeling in your eyes.  Eye redness.  Swollen eyelids.  Blurred vision. How is this diagnosed? This condition is diagnosed based on your symptoms and medical history. Your health care provider may also take a sample of discharge from your eye to find the cause of your infection. This is rarely done. How is this treated? This condition  may be treated with:  Antibiotic eye drops or ointment to clear the infection more quickly and prevent the spread of infection to others.  Oral antibiotic medicines to treat infections that do not respond to drops or ointments or that last longer than 10 days.  Cool, wet cloths (cool compresses) placed on the eyes.  Artificial tears applied 2-6 times a day. Follow these instructions at home: Medicines  Take or apply your antibiotic medicine as told by your health care provider. Do not stop taking or applying the antibiotic even if you start to feel better.  Take or apply over-the-counter and prescription medicines only as told by your health care provider.  Be very careful to avoid touching the edge of your eyelid with the eye-drop bottle or the ointment tube when you apply medicines to the affected eye. This will keep you from spreading the infection to your other eye or to other people. Managing discomfort  Gently wipe away any drainage from your eye with a warm, wet washcloth or a cotton ball.  Apply a clean, cool compress to your eye for 10-20 minutes, 3-4 times a day. General instructions  Do not wear contact lenses until the inflammation is gone and your health care provider says it is safe to wear them again. Ask your health care provider how to sterilize or replace your contact lenses before you use them again. Wear glasses until you can resume wearing contact lenses.  Avoid wearing eye makeup until the inflammation is gone. Throw away any old  eye cosmetics that may be contaminated.  Change or wash your pillowcase every day.  Do not share towels or washcloths. This may spread the infection.  Wash your hands often with soap and water. Use paper towels to dry your hands.  Avoid touching or rubbing your eyes.  Do not drive or use heavy machinery if your vision is blurred. Contact a health care provider if:  You have a fever.  Your symptoms do not get better after 10  days. Get help right away if you have:  A fever and your symptoms suddenly get worse.  Severe pain when you move your eye.  Facial pain, redness, or swelling.  Sudden loss of vision. Summary  Bacterial conjunctivitis is an infection of the clear membrane that covers the white part of your eye and the inner surface of your eyelid (conjunctiva).  Bacterial conjunctivitis spreads very easily from person to person (is contagious).  Wash your hands often with soap and water. Use paper towels to dry your hands.  Take or apply your antibiotic medicine as told by your health care provider. Do not stop taking or applying the antibiotic even if you start to feel better.  Contact a health care provider if you have a fever or your symptoms do not get better after 10 days. This information is not intended to replace advice given to you by your health care provider. Make sure you discuss any questions you have with your health care provider. Document Released: 08/18/2005 Document Revised: 03/24/2018 Document Reviewed: 03/24/2018 Elsevier Interactive Patient Education  2019 [employer].

## 2018-10-14 NOTE — Progress Notes (Signed)
Subjective:    Patient ID: [name] Case, female    DOB: December 26, 1931, 85 y.o.   MRN: [id number]  HPI  [name] Case is an 85 year old female who presents today with right eye discharge that has been present for two weeks. Eye is described as: red and drainage that is green/yellow and occurs throughout the  day.  History of trauma: No Sensation of foreign body: No Photophobia: No Feeling of sand or grittiness in eye: No Drainage: No Visual changes: No Wears contacts: No History of allergic rhinitis present  Treatment with OTC "eye drops" has provided limited benefit.   Review of Systems  Constitutional: Negative for chills and fever.  HENT: Negative for congestion, ear pain, postnasal drip, sinus pressure, sinus pain, sneezing and sore throat.   Eyes: Positive for discharge, redness and itching. Negative for visual disturbance.  Respiratory: Negative for cough.   Cardiovascular: Negative for chest pain and palpitations.  Skin: Negative for rash.  Neurological: Negative for dizziness and headaches.   Past Medical History:  Diagnosis Date  . Allergy   . Arthritis   . Atrial fibrillation ([place])   . Bronchitis   . Cancer ([place])    Bilateral upper tract transitional cell carcinoma  . Cardiomegaly   . CHF (congestive heart failure) ([place])   . Complication of anesthesia    Difficult to arouse  . COPD (chronic obstructive pulmonary disease) ([facility])    borderline   pt. denies at preop  . Diabetes mellitus without complication ([place])    Controlled by diet and exercise  . Diverticulosis of sigmoid colon   . Dyspnea    at times  . Dysrhythmia    a fib  . Family history of bladder cancer   . Family history of colon cancer   . Family history of kidney cancer   . Fatty liver   . GERD (gastroesophageal reflux disease)   . History of hiatal hernia 01/20/2015   Small sliding, noted on CT  . History of kidney stones   . Hyperlipidemia   . Hypertension   . Hypothyroidism   .  Lynch syndrome   . PONV (postoperative nausea and vomiting)   . Supraumbilical hernia 46/27/0350   Small noted on CT  . Thyroid disease   . UTI (urinary tract infection)      Social History   Socioeconomic History  . Marital status: Widowed    Spouse name: Not on file  . Number of children: Not on file  . Years of education: Not on file  . Highest education level: Not on file  Occupational History  . Occupation: retired  Scientific laboratory technician  . Financial resource strain: Not on file  . Food insecurity:    Worry: Not on file    Inability: Not on file  . Transportation needs:    Medical: Not on file    Non-medical: Not on file  Tobacco Use  . Smoking status: Never Smoker  . Smokeless tobacco: Never Used  Substance and Sexual Activity  . Alcohol use: No    Alcohol/week: 0.0 standard drinks  . Drug use: No  . Sexual activity: Not Currently    Birth control/protection: Surgical  Lifestyle  . Physical activity:    Days per week: Not on file    Minutes per session: Not on file  . Stress: Not on file  Relationships  . Social connections:    Talks on phone: Not on file    Gets together: Not on  file    Attends religious service: Not on file    Active member of club or organization: Not on file    Attends meetings of clubs or organizations: Not on file    Relationship status: Not on file  . Intimate partner violence:    Fear of current or ex partner: Not on file    Emotionally abused: Not on file    Physically abused: Not on file    Forced sexual activity: Not on file  Other Topics Concern  . Not on file  Social History Narrative  . Not on file    Past Surgical History:  Procedure Laterality Date  . ABDOMINAL HYSTERECTOMY    . APPENDECTOMY    . CHOLECYSTECTOMY    . COLONOSCOPY  07/16/2009  . CYSTOSCOPY W/ URETERAL STENT PLACEMENT Right 12/04/2017   Procedure: CYSTOSCOPY WITH RETROGRADE PYELOGRAM/URETERAL STENT PLACEMENT;  Surgeon: Festus Aloe, MD;  Location: [facility];   Service: Urology;  Laterality: Right;  . CYSTOSCOPY W/ URETERAL STENT PLACEMENT Right 02/26/2018   Procedure: CYSTOSCOPY WITH RETROGRADE PYELOGRAM/URETERAL STENT PLACEMENT;  Surgeon: Kathie Rhodes, MD;  Location: [facility];  Service: Urology;  Laterality: Right;  . CYSTOSCOPY WITH URETEROSCOPY AND STENT PLACEMENT Bilateral 01/29/2018   Procedure: BILATERAL URETEROSCOPY AND TUMOR RESECTION  WITH LASER RIGHT STENT EXCHANGE LEFT STENT PLACEMENT;  Surgeon: Ardis Hughs, MD;  Location: [facility];  Service: Urology;  Laterality: Bilateral;  . CYSTOSCOPY WITH URETEROSCOPY AND STENT PLACEMENT Bilateral 02/10/2018   Procedure: BILATERAL URETEROSCOPY AND TUMOR EXCISION BILATERAL STENT EXCHANGE;  Surgeon: Ardis Hughs, MD;  Location: [facility];  Service: Urology;  Laterality: Bilateral;  . CYSTOSCOPY/RETROGRADE/URETEROSCOPY Bilateral 12/24/2017   Procedure: BILATERAL URETEROSCOPY, RIGHT STENT EXCHANGE, STONE EXTRACTION WITH BASKET BILATERAL RETROGRADE PYELOGRAM, BIOPSY OF RIGHT UPPER POLE TUMOR,  BILATERAL RENAL PELVIS FLUID SENT FOR CYTOLOGY;  Surgeon: Ardis Hughs, MD;  Location: [facility];  Service: Urology;  Laterality: Bilateral;  . CYSTOSCOPY/URETEROSCOPY/HOLMIUM LASER/STENT PLACEMENT Bilateral 06/03/2018   Procedure: CYSTOSCOPY/BILATERAL URETEROSCOPY/ LASER TUMOR ABLATION BILATERAL / BILATERAL STENT PLACEMENT/BILATERAL RETROGRADE PYELOGRAM/BLADDER BIOPSY WITH FULGURATION;  Surgeon: Ardis Hughs, MD;  Location: [facility];  Service: Urology;  Laterality: Bilateral;  . HOLMIUM LASER APPLICATION Bilateral 36/01/4402   Procedure: HOLMIUM LASER APPLICATION;  Surgeon: Ardis Hughs, MD;  Location: [facility];  Service: Urology;  Laterality: Bilateral;  . KNEE SURGERY Right   . KNEE SURGERY Left    torn ligament repair  . UPPER GI ENDOSCOPY    . ureteroscopy laser tumor ablation bilateral stent placement     Dr. Louis Meckel 06-03-18     Family History  Problem Relation Age of Onset  . Heart disease  Mother   . Colon cancer Mother 35       d. 18  . Tuberculosis Father   . Bladder Cancer Brother        urothelial cancer  . Bladder Cancer Brother        ureter cancer  . Kidney cancer Brother   . Skin cancer Sister   . Cancer Maternal Aunt        NOS  . Colon cancer Other        dx 1st time under 60, second time at 61  . Lung cancer Other   . Muscular dystrophy Other        d. 63  . Testicular cancer Other 24       great nephew  . Alcohol abuse Daughter   . Drug abuse Daughter   .  Schizophrenia Daughter   . Colon cancer Grandchild     Allergies  Allergen Reactions  . Codeine Hives and Rash    In cough syrup preparations    Current Outpatient Medications on File Prior to Visit  Medication Sig Dispense Refill  . acetaminophen (TYLENOL) 500 MG tablet Take 500-1,000 mg by mouth every 8 (eight) hours as needed for moderate pain or headache.     [name] apixaban (ELIQUIS) 5 MG TABS tablet Take 1 tablet (5 mg total) by mouth 2 (two) times daily. Resume once the bleeding has stopped from the surgery for 2 days. (Patient taking differently: Take 5 mg by mouth 2 (two) times daily. ) 60 tablet   . atorvastatin (LIPITOR) 20 MG tablet TAKE 1 TABLET BY MOUTH DAILY AT 6 PM 90 tablet 0  . diltiazem (CARDIZEM CD) 180 MG 24 hr capsule TAKE 1 CAPSULE BY MOUTH DAILY (Patient taking differently: Take 180 mg by mouth daily. ) 90 capsule 1  . furosemide (LASIX) 40 MG tablet Take 1 tablet (40 mg total) by mouth daily. 30 tablet 11  . ipratropium (ATROVENT) 0.02 % nebulizer solution Take 5 mLs (1 mg total) by nebulization every 6 (six) hours as needed for wheezing or shortness of breath. 75 mL 12  . irbesartan (AVAPRO) 150 MG tablet Take 0.5 tablets (75 mg total) by mouth daily.    [name] levalbuterol (XOPENEX) 0.63 MG/3ML nebulizer solution Take 3 mLs (0.63 mg total) by nebulization every 6 (six) hours as needed for wheezing or shortness of breath. 3 mL 12  . levothyroxine (SYNTHROID, LEVOTHROID) 100 MCG  tablet Take 1 tablet (100 mcg total) by mouth daily. (Patient taking differently: Take 100 mcg by mouth daily before breakfast. ) 90 tablet 3  . metoprolol succinate (TOPROL-XL) 50 MG 24 hr tablet TAKE 1 TABLET BY MOUTH DAILY WITH OR IMMEDIATELY FOLLOWING A MEAL. Please keep upcoming appt with Dr. Marlou Porch before anymore refill. 30 tablet 0  . potassium chloride SA (K-DUR,KLOR-CON) 20 MEQ tablet Take 1 tablet (20 mEq total) by mouth daily. 90 tablet 3   No current facility-administered medications on file prior to visit.     BP 108/68 (BP Location: Left Arm, Patient Position: Sitting, Cuff Size: Large)   Pulse 71   Temp (!) 97.5 F (36.4 C) (Oral)   Ht 5' (1.524 m)   Wt 230 lb (104.3 kg)   LMP  (LMP Unknown)   SpO2 96%   BMI 44.92 kg/m       Objective:   Physical Exam Constitutional:      Comments: Wearing O2 via [place]  HENT:     Right Ear: Tympanic membrane normal.     Left Ear: Tympanic membrane normal.     Nose: Nose normal. No congestion or rhinorrhea.     Mouth/Throat:     Mouth: Mucous membranes are moist.     Pharynx: Oropharynx is clear.  Eyes:     General:        Right eye: Discharge present.        Left eye: No discharge.     Comments: Right eye: Diffuse injection of the conjunctiva. No white spots, opacity, or foreign body appreciated. Yellow/green drainage present.No collection of blood in the anterior chamber. No ciliary flush surrounding iris.   Left eye:  No injection of conjunctive present. No white spots, opacity, or foreign body appreciated. No blood or pus present in the anterior chamber. No ciliary flush surrounding iris  No photophobia or eye pain  appreciated during exam.   Neurological:     Mental Status: She is alert.        Assessment & Plan:  1. Acute bacterial conjunctivitis of right eye Exam and history are most consistent with bacterial conjunctivitis. Will treat with Polytrim; advised clean, cool compresses and discussed hygiene to avoid  contamination (not sharing towels/washcloths). Advised use of OTC Naphcon A for itching that has been present bilaterally. Follow up if no improvement. Return precautions provided.  - trimethoprim-polymyxin b (POLYTRIM) ophthalmic solution; Place 1 drop into the right eye every 6 (six) hours.  Dispense: 10 mL; Refill: 0  Delano Metz, FNP-C

## 2018-10-15 ENCOUNTER — Other Ambulatory Visit: Payer: Self-pay | Admitting: Cardiology

## 2018-10-20 ENCOUNTER — Ambulatory Visit: Payer: Self-pay | Admitting: *Deleted

## 2018-10-20 NOTE — Telephone Encounter (Signed)
Pt reports vaginal yeast infection; states H/O, "I leak urine so I'm always moist there."  Diflucan called in for pt 10/01/2018. Pt requesting "Cream I've use since 2018". Pt unsure of name; noted Lotrisone requested 10/01/2018. States "I don't want the pill." Reports itching, no open areas.  If appropriate [facility] [address] Please advise: [phone]  Reason for Disposition . Caller requesting a NON-URGENT new prescription or refill and triager unable to refill per unit policy  Answer Assessment - Initial Assessment Questions 1. SYMPTOMS: "Do you have any symptoms?"     Yes, vaginal yeast 2. SEVERITY: If symptoms are present, ask "Are they mild, moderate or severe?"     moderate  Protocols used: MEDICATION QUESTION CALL-A-AH

## 2018-10-20 NOTE — Telephone Encounter (Signed)
Diflucan is the appropriate treatment. Can try otc cream is she desires a cream. Lotrisone is not appropriate as steroids are not supposed to be used vaginally.

## 2018-10-20 NOTE — Telephone Encounter (Signed)
Patient informed of MD response and stated understanding  

## 2018-10-21 ENCOUNTER — Encounter: Payer: Self-pay | Admitting: Internal Medicine

## 2018-10-21 ENCOUNTER — Other Ambulatory Visit: Payer: Self-pay

## 2018-10-21 ENCOUNTER — Emergency Department (HOSPITAL_COMMUNITY)
Admission: EM | Admit: 2018-10-21 | Discharge: 2018-10-21 | Disposition: A | Discharge status: Home / Self Care | Payer: Medicare Other | Attending: Emergency Medicine | Admitting: Emergency Medicine

## 2018-10-21 ENCOUNTER — Ambulatory Visit (INDEPENDENT_AMBULATORY_CARE_PROVIDER_SITE_OTHER): Payer: Medicare Other | Admitting: Internal Medicine

## 2018-10-21 ENCOUNTER — Ambulatory Visit: Payer: Self-pay

## 2018-10-21 ENCOUNTER — Encounter (HOSPITAL_COMMUNITY): Payer: Self-pay | Admitting: Emergency Medicine

## 2018-10-21 DIAGNOSIS — I5033 Acute on chronic diastolic (congestive) heart failure: Secondary | ICD-10-CM

## 2018-10-21 DIAGNOSIS — L03115 Cellulitis of right lower limb: Secondary | ICD-10-CM | POA: Diagnosis not present

## 2018-10-21 DIAGNOSIS — R2241 Localized swelling, mass and lump, right lower limb: Secondary | ICD-10-CM | POA: Insufficient documentation

## 2018-10-21 DIAGNOSIS — L0390 Cellulitis, unspecified: Secondary | ICD-10-CM | POA: Insufficient documentation

## 2018-10-21 DIAGNOSIS — Z5321 Procedure and treatment not carried out due to patient leaving prior to being seen by health care provider: Secondary | ICD-10-CM | POA: Insufficient documentation

## 2018-10-21 LAB — BASIC METABOLIC PANEL
Anion gap: 8 (ref 5–15)
BUN: 13 mg/dL (ref 8–23)
CO2: 34 mmol/L — ABNORMAL HIGH (ref 22–32)
Calcium: 9.2 mg/dL (ref 8.9–10.3)
Chloride: 99 mmol/L (ref 98–111)
Creatinine, Ser: 1.04 mg/dL — ABNORMAL HIGH (ref 0.44–1.00)
GFR calc Af Amer: 57 mL/min — ABNORMAL LOW (ref >60)
GFR calc non Af Amer: 49 mL/min — ABNORMAL LOW (ref >60)
Glucose, Bld: 116 mg/dL — ABNORMAL HIGH (ref 70–99)
Potassium: 3.4 mmol/L — ABNORMAL LOW (ref 3.5–5.1)
Sodium: 141 mmol/L (ref 135–145)

## 2018-10-21 LAB — CBC
HCT: 36.3 % (ref 36.0–46.0)
Hemoglobin: 11.5 g/dL — ABNORMAL LOW (ref 12.0–15.0)
MCH: 32 pg (ref 26.0–34.0)
MCHC: 31.7 g/dL (ref 30.0–36.0)
MCV: 101.1 fL — ABNORMAL HIGH (ref 80.0–100.0)
Platelets: 248 10*3/uL (ref 150–400)
RBC: 3.59 MIL/uL — ABNORMAL LOW (ref 3.87–5.11)
RDW: 13.2 % (ref 11.5–15.5)
WBC: 7.2 10*3/uL (ref 4.0–10.5)
nRBC: 0 % (ref 0.0–0.2)

## 2018-10-21 MED ORDER — METOPROLOL SUCCINATE ER 50 MG PO TB24: ORAL_TABLET | ORAL | 3 refills | Status: DC

## 2018-10-21 MED ORDER — SULFAMETHOXAZOLE-TRIMETHOPRIM 800-160 MG PO TABS: 1.0000 | ORAL_TABLET | Freq: Two times a day (BID) | ORAL | 0 refills | Status: DC

## 2018-10-21 MED ORDER — FUROSEMIDE 40 MG PO TABS: 80.0000 mg | ORAL_TABLET | Freq: Every day | ORAL | 3 refills | Status: DC

## 2018-10-21 NOTE — ED Triage Notes (Signed)
C/o swelling to R lower leg x 2 weeks.  Swelling worse today with warmth and redness.  Denies pain.

## 2018-10-21 NOTE — Patient Instructions (Signed)
We have sent in the bactrim to take 1 pill twice a day for 5 days.   Take 2 pills of lasix daily for 1 week. Call us to let us know how you are doing on Monday.  If the redness in the leg worsens or you start having fevers come back or go to the ER.

## 2018-10-21 NOTE — Telephone Encounter (Signed)
Pt. Reports she noticed swelling to her right leg 2-3 days ago. Yesterday "it became worse,swelling up to my knee and my leg feels warm to touch." Mild swelling to left ankle, "which is normal." Reports she went to ED last night and left AMA. Denies any pain to her leg, denies any injury.Appointment made with her PCP for today.  Reason for Disposition . [1] MODERATE leg swelling (e.g., swelling extends up to knees) AND [2] new onset or worsening  Answer Assessment - Initial Assessment Questions 1. ONSET: "When did the swelling start?" (e.g., minutes, hours, days)     Started 2-3 days ago 2. LOCATION: "What part of the leg is swollen?"  "Are both legs swollen or just one leg?"     Right leg  3. SEVERITY: "How bad is the swelling?" (e.g., localized; mild, moderate, severe)  - Localized - small area of swelling localized to one leg  - MILD pedal edema - swelling limited to foot and ankle, pitting edema < 1/4 inch (6 mm) deep, rest and elevation eliminate most or all swelling  - MODERATE edema - swelling of lower leg to knee, pitting edema > 1/4 inch (6 mm) deep, rest and elevation only partially reduce swelling  - SEVERE edema - swelling extends above knee, facial or hand swelling present      Moderate 4. REDNESS: "Does the swelling look red or infected?"     Redness to ankle 5. PAIN: "Is the swelling painful to touch?" If so, ask: "How painful is it?"   (Scale 1-10; mild, moderate or severe)     No pain 6. FEVER: "Do you have a fever?" If so, ask: "What is it, how was it measured, and when did it start?"      No fever, but leg is warm to touch 7. CAUSE: "What do you think is causing the leg swelling?"     No 8. MEDICAL HISTORY: "Do you have a history of heart failure, kidney disease, liver failure, or cancer?"     A fib with heart failure 9. RECURRENT SYMPTOM: "Have you had leg swelling before?" If so, ask: "When was the last time?" "What happened that time?"     No 10. OTHER SYMPTOMS: "Do  you have any other symptoms?" (e.g., chest pain, difficulty breathing)       No 11. PREGNANCY: "Is there any chance you are pregnant?" "When was your last menstrual period?"       No  Protocols used: LEG SWELLING AND EDEMA-A-AH

## 2018-10-21 NOTE — Assessment & Plan Note (Signed)
Rx for lasix 80 mg daily as she did not get robust response to 40 mg BID.

## 2018-10-21 NOTE — Assessment & Plan Note (Signed)
Rx for bactrim. Also treating the fluid to help alleviate symptoms.

## 2018-10-21 NOTE — Progress Notes (Signed)
   Subjective:   Patient ID: [name] Case, female    DOB: 01-19-1932, 85 y.o.   MRN: [id number]  HPI The patient is an 85 YO female coming in for concerns about leg swelling. Went to ER last night but did not stay to be seen after several hours. Started 2-3 days ago. She is also having some redness and heat on the right leg. Some pain as well. She did double her lasix as per cardiology from 2/10-2/15 which did cause the swelling to lessen but has returned. She denies much urination from lasix dosing. Denies fevers or chills.   Review of Systems  Constitutional: Negative.   HENT: Negative.   Eyes: Negative.   Respiratory: Negative for cough, chest tightness and shortness of breath.   Cardiovascular: Positive for leg swelling. Negative for chest pain and palpitations.  Gastrointestinal: Negative for abdominal distention, abdominal pain, constipation, diarrhea, nausea and vomiting.  Musculoskeletal: Positive for myalgias. Negative for arthralgias, back pain and gait problem.  Skin: Positive for color change and rash.  Neurological: Negative.   Psychiatric/Behavioral: Negative.     Objective:  Physical Exam Constitutional:      Appearance: She is well-developed.  HENT:     Head: Normocephalic and atraumatic.  Neck:     Musculoskeletal: Normal range of motion.  Cardiovascular:     Rate and Rhythm: Normal rate and regular rhythm.  Pulmonary:     Effort: Pulmonary effort is normal. No respiratory distress.     Breath sounds: Normal breath sounds. No wheezing or rales.  Abdominal:     General: Bowel sounds are normal. There is no distension.     Palpations: Abdomen is soft.     Tenderness: There is no abdominal tenderness. There is no rebound.  Musculoskeletal:        General: Swelling and tenderness present.     Comments: 2+ edema right leg to knee and some redness around the shin, left leg with 1+ edema and no color change  Skin:    General: Skin is warm and dry.  Neurological:      Mental Status: She is alert and oriented to person, place, and time.     Coordination: Coordination normal.     Vitals:   10/21/18 1320  BP: 120/62  Pulse: 61  Temp: 98.3 F (36.8 C)  TempSrc: Oral  SpO2: 94%  Weight: 228 lb (103.4 kg)  Height: 5' (1.524 m)    Assessment & Plan:

## 2018-10-21 NOTE — ED Notes (Signed)
Patient states she is going to make a doctors appointment in AM. Leaving AMA.

## 2018-10-21 NOTE — ED Notes (Signed)
PA at triage in to see pt for screening and lab orders.

## 2018-10-21 NOTE — Telephone Encounter (Signed)
fyi

## 2018-10-23 ENCOUNTER — Other Ambulatory Visit: Payer: Self-pay | Admitting: Internal Medicine

## 2018-10-26 ENCOUNTER — Other Ambulatory Visit: Payer: Self-pay | Admitting: Internal Medicine

## 2018-10-27 ENCOUNTER — Ambulatory Visit: Payer: Self-pay

## 2018-10-27 ENCOUNTER — Encounter: Payer: Self-pay | Admitting: Nurse Practitioner

## 2018-10-27 ENCOUNTER — Ambulatory Visit (INDEPENDENT_AMBULATORY_CARE_PROVIDER_SITE_OTHER): Payer: Medicare Other | Admitting: Nurse Practitioner

## 2018-10-27 VITALS — BP 120/60 | HR 75 | Temp 97.9°F | Ht 60.0 in

## 2018-10-27 DIAGNOSIS — L03115 Cellulitis of right lower limb: Secondary | ICD-10-CM | POA: Diagnosis not present

## 2018-10-27 MED ORDER — CEPHALEXIN 500 MG PO CAPS: 500.0000 mg | ORAL_CAPSULE | Freq: Four times a day (QID) | ORAL | 0 refills | Status: DC

## 2018-10-27 NOTE — Patient Instructions (Addendum)
Take keflex course as prescribed  Please follow up for fevers over 101, if your symptoms get worse, or if your symptoms dont get better with the antibiotic.   Cellulitis, Adult  Cellulitis is a skin infection. The infected area is often warm, red, swollen, and sore. It occurs most often in the arms and lower legs. It is very important to get treated for this condition. What are the causes? This condition is caused by bacteria. The bacteria enter through a break in the skin, such as a cut, burn, insect bite, open sore, or crack. What increases the risk? This condition is more likely to occur in people who:  Have a weak body defense system (immune system).  Have open cuts, burns, bites, or scrapes on the skin.  Are older than 83 years of age.  Have a blood sugar problem (diabetes).  Have a long-lasting (chronic) liver disease (cirrhosis) or kidney disease.  Are very overweight (obese).  Have a skin problem, such as: ? Itchy rash (eczema). ? Slow movement of blood in the veins (venous stasis). ? Fluid buildup below the skin (edema).  Have been treated with high-energy rays (radiation).  Use IV drugs. What are the signs or symptoms? Symptoms of this condition include:  Skin that is: ? Red. ? Streaking. ? Spotting. ? Swollen. ? Sore or painful when you touch it. ? Warm.  A fever.  Chills.  Blisters. How is this diagnosed? This condition is diagnosed based on:  Medical history.  Physical exam.  Blood tests.  Imaging tests. How is this treated? Treatment for this condition may include:  Medicines to treat infections or allergies.  Home care, such as: ? Rest. ? Placing cold or warm cloths (compresses) on the skin.  Hospital care, if the condition is very bad. Follow these instructions at home: Medicines  Take over-the-counter and prescription medicines only as told by your doctor.  If you were prescribed an antibiotic medicine, take it as told by your  doctor. Do not stop taking it even if you start to feel better. General instructions   Drink enough fluid to keep your pee (urine) pale yellow.  Do not touch or rub the infected area.  Raise (elevate) the infected area above the level of your heart while you are sitting or lying down.  Place cold or warm cloths on the area as told by your doctor.  Keep all follow-up visits as told by your doctor. This is important. Contact a doctor if:  You have a fever.  You do not start to get better after 1-2 days of treatment.  Your bone or joint under the infected area starts to hurt after the skin has healed.  Your infection comes back. This can happen in the same area or another area.  You have a swollen bump in the area.  You have new symptoms.  You feel ill and have muscle aches and pains. Get help right away if:  Your symptoms get worse.  You feel very sleepy.  You throw up (vomit) or have watery poop (diarrhea) for a long time.  You see red streaks coming from the area.  Your red area gets larger.  Your red area turns dark in color. These symptoms may represent a serious problem that is an emergency. Do not wait to see if the symptoms will go away. Get medical help right away. Call your local emergency services (911 in the [place] Do not drive yourself to the hospital. Summary  Cellulitis is  a skin infection. The area is often warm, red, swollen, and sore.  This condition is treated with medicines, rest, and cold and warm cloths.  Take all medicines only as told by your doctor.  Tell your doctor if symptoms do not start to get better after 1-2 days of treatment. This information is not intended to replace advice given to you by your health care provider. Make sure you discuss any questions you have with your health care provider. Document Released: 02/04/2008 Document Revised: 01/07/2018 Document Reviewed: 01/07/2018 Elsevier Interactive Patient Education  2019 [employer].

## 2018-10-27 NOTE — Progress Notes (Signed)
[name] Case is a 85 y.o. female with the following history as recorded in EpicCare:  Patient Active Problem List   Diagnosis Date Noted  . Cellulitis 10/21/2018  . COPD with acute exacerbation ([place]) 07/30/2018  . Lynch syndrome 05/05/2018  . Genetic testing 04/28/2018  . Adjustment disorder 04/23/2018  . Urothelial cancer ([place]) 04/12/2018  . Lichen planus 91/63/8466  . Obesity hypoventilation syndrome ([place])   . Hypokalemia   . Acute on chronic diastolic (congestive) heart failure ([place]) 01/09/2016  . History of snoring 05/20/2015  . Chronic respiratory failure with hypoxia ([place])   . Type 2 diabetes mellitus without complication ([place])   . Atrial fibrillation ([place]) 01/06/2015  . Hypothyroidism 03/09/2007  . Hyperlipidemia 03/09/2007  . Morbid obesity ([place]) 03/09/2007  . Essential hypertension 03/09/2007  . ALLERGIC RHINITIS 03/09/2007  . Asthma 03/09/2007  . GERD 03/09/2007  . INCONTINENCE 03/09/2007    Current Outpatient Medications  Medication Sig Dispense Refill  . acetaminophen (TYLENOL) 500 MG tablet Take 500-1,000 mg by mouth every 8 (eight) hours as needed for moderate pain or headache.     [name] atorvastatin (LIPITOR) 20 MG tablet TAKE 1 TABLET BY MOUTH DAILY AT 6 PM 90 tablet 0  . diltiazem (CARDIZEM CD) 180 MG 24 hr capsule TAKE 1 CAPSULE BY MOUTH DAILY (Patient taking differently: Take 180 mg by mouth daily. ) 90 capsule 1  . ELIQUIS 5 MG TABS tablet TAKE 1 TABLET BY MOUTH TWICE A DAY 180 tablet 1  . furosemide (LASIX) 40 MG tablet Take 2 tablets (80 mg total) by mouth daily. 180 tablet 3  . ipratropium (ATROVENT) 0.02 % nebulizer solution Take 5 mLs (1 mg total) by nebulization every 6 (six) hours as needed for wheezing or shortness of breath. 75 mL 12  . irbesartan (AVAPRO) 150 MG tablet Take 0.5 tablets (75 mg total) by mouth daily.    [name] levalbuterol (XOPENEX) 0.63 MG/3ML nebulizer solution Take 3 mLs (0.63 mg total) by nebulization every 6 (six) hours as needed for  wheezing or shortness of breath. 3 mL 12  . levothyroxine (SYNTHROID, LEVOTHROID) 100 MCG tablet Take 1 tablet (100 mcg total) by mouth daily. (Patient taking differently: Take 100 mcg by mouth daily before breakfast. ) 90 tablet 3  . metoprolol succinate (TOPROL-XL) 50 MG 24 hr tablet TAKE 1 TABLET BY MOUTH DAILY WITH OR IMMEDIATELY FOLLOWING A MEAL. 90 tablet 3  . potassium chloride SA (K-DUR,KLOR-CON) 20 MEQ tablet Take 1 tablet (20 mEq total) by mouth daily. 90 tablet 3  . sulfamethoxazole-trimethoprim (BACTRIM DS,SEPTRA DS) 800-160 MG tablet Take 1 tablet by mouth 2 (two) times daily. 10 tablet 0  . trimethoprim-polymyxin b (POLYTRIM) ophthalmic solution Place 1 drop into the right eye every 6 (six) hours. 10 mL 0  . cephALEXin (KEFLEX) 500 MG capsule Take 1 capsule (500 mg total) by mouth 4 (four) times daily. 20 capsule 0   No current facility-administered medications for this visit.     Allergies: Codeine  Past Medical History:  Diagnosis Date  . Allergy   . Arthritis   . Atrial fibrillation ([place])   . Bronchitis   . Cancer ([place])    Bilateral upper tract transitional cell carcinoma  . Cardiomegaly   . CHF (congestive heart failure) ([place])   . Complication of anesthesia    Difficult to arouse  . COPD (chronic obstructive pulmonary disease) ([facility])    borderline   pt. denies at preop  . Diabetes mellitus without complication ([place])  Controlled by diet and exercise  . Diverticulosis of sigmoid colon   . Dyspnea    at times  . Dysrhythmia    a fib  . Family history of bladder cancer   . Family history of colon cancer   . Family history of kidney cancer   . Fatty liver   . GERD (gastroesophageal reflux disease)   . History of hiatal hernia 01/20/2015   Small sliding, noted on CT  . History of kidney stones   . Hyperlipidemia   . Hypertension   . Hypothyroidism   . Lynch syndrome   . PONV (postoperative nausea and vomiting)   . Supraumbilical hernia 78/93/8101   Small  noted on CT  . Thyroid disease   . UTI (urinary tract infection)     Past Surgical History:  Procedure Laterality Date  . ABDOMINAL HYSTERECTOMY    . APPENDECTOMY    . CHOLECYSTECTOMY    . COLONOSCOPY  07/16/2009  . CYSTOSCOPY W/ URETERAL STENT PLACEMENT Right 12/04/2017   Procedure: CYSTOSCOPY WITH RETROGRADE PYELOGRAM/URETERAL STENT PLACEMENT;  Surgeon: Festus Aloe, MD;  Location: [facility];  Service: Urology;  Laterality: Right;  . CYSTOSCOPY W/ URETERAL STENT PLACEMENT Right 02/26/2018   Procedure: CYSTOSCOPY WITH RETROGRADE PYELOGRAM/URETERAL STENT PLACEMENT;  Surgeon: Kathie Rhodes, MD;  Location: [facility];  Service: Urology;  Laterality: Right;  . CYSTOSCOPY WITH URETEROSCOPY AND STENT PLACEMENT Bilateral 01/29/2018   Procedure: BILATERAL URETEROSCOPY AND TUMOR RESECTION  WITH LASER RIGHT STENT EXCHANGE LEFT STENT PLACEMENT;  Surgeon: Ardis Hughs, MD;  Location: [facility];  Service: Urology;  Laterality: Bilateral;  . CYSTOSCOPY WITH URETEROSCOPY AND STENT PLACEMENT Bilateral 02/10/2018   Procedure: BILATERAL URETEROSCOPY AND TUMOR EXCISION BILATERAL STENT EXCHANGE;  Surgeon: Ardis Hughs, MD;  Location: [facility];  Service: Urology;  Laterality: Bilateral;  . CYSTOSCOPY/RETROGRADE/URETEROSCOPY Bilateral 12/24/2017   Procedure: BILATERAL URETEROSCOPY, RIGHT STENT EXCHANGE, STONE EXTRACTION WITH BASKET BILATERAL RETROGRADE PYELOGRAM, BIOPSY OF RIGHT UPPER POLE TUMOR,  BILATERAL RENAL PELVIS FLUID SENT FOR CYTOLOGY;  Surgeon: Ardis Hughs, MD;  Location: [facility];  Service: Urology;  Laterality: Bilateral;  . CYSTOSCOPY/URETEROSCOPY/HOLMIUM LASER/STENT PLACEMENT Bilateral 06/03/2018   Procedure: CYSTOSCOPY/BILATERAL URETEROSCOPY/ LASER TUMOR ABLATION BILATERAL / BILATERAL STENT PLACEMENT/BILATERAL RETROGRADE PYELOGRAM/BLADDER BIOPSY WITH FULGURATION;  Surgeon: Ardis Hughs, MD;  Location: [facility];  Service: Urology;  Laterality: Bilateral;  . HOLMIUM LASER APPLICATION  Bilateral 75/09/256   Procedure: HOLMIUM LASER APPLICATION;  Surgeon: Ardis Hughs, MD;  Location: [facility];  Service: Urology;  Laterality: Bilateral;  . KNEE SURGERY Right   . KNEE SURGERY Left    torn ligament repair  . UPPER GI ENDOSCOPY    . ureteroscopy laser tumor ablation bilateral stent placement     Dr. Louis Meckel 06-03-18     Family History  Problem Relation Age of Onset  . Heart disease Mother   . Colon cancer Mother 60       d. 36  . Tuberculosis Father   . Bladder Cancer Brother        urothelial cancer  . Bladder Cancer Brother        ureter cancer  . Kidney cancer Brother   . Skin cancer Sister   . Cancer Maternal Aunt        NOS  . Colon cancer Other        dx 1st time under 70, second time at 70  . Lung cancer Other   . Muscular dystrophy Other  d. 16  . Testicular cancer Other 24       great nephew  . Alcohol abuse Daughter   . Drug abuse Daughter   . Schizophrenia Daughter   . Colon cancer Grandchild     Social History   Tobacco Use  . Smoking status: Never Smoker  . Smokeless tobacco: Never Used  Substance Use Topics  . Alcohol use: No    Alcohol/week: 0.0 standard drinks     Subjective:  Ms Duffett is here today for f/u of possible RLE cellulitis, seen by PCP for this on 2/20, given bactrim course to complete but states the redness Is no better and now she's having more pain in the front of her right lower leg. She report mild LE edema, which she says is no worse. She is maintained on 2L O2 [place], denies any SOB today She denies fevers, chills, weakness, falls, cp.  ROS- See HPI  Objective:  Vitals:   10/27/18 1110  BP: 120/60  Pulse: 75  Temp: 97.9 F (36.6 C)  TempSrc: Oral  SpO2: 97%  Height: 5' (1.524 m)    General: Well developed, well nourished, in no acute distress  Skin : Warm and dry. Mild erythema to right anterior shin.  Head: Normocephalic and atraumatic  Eyes: Sclera and conjunctiva clear; pupils round and  reactive to light; extraocular movements intact  Oropharynx: Pink, supple. No suspicious lesions  Neck: Supple Lungs: Respirations unlabored; clear to auscultation bilaterally  CVS exam: Normal rate and regular rhythm.  Musculoskeletal: No deformities, normal ROM Extremities: No cyanosis, clubbing; 1+ edema to BLE  Vessels: Symmetric bilaterally. Varicose veins scattered to BLE Neurologic: Alert and oriented; speech intact; face symmetrical; moves all extremities well; CNII-XII intact without focal deficit  Psychiatric: Normal mood and affect.  Assessment:  1. Cellulitis of right lower extremity     Plan:   Discussed case with her PCP Dr Sharlet Salina. Unsure if this is cellulitis vs chronic venous insufficiency Low suspicion for DVT, she is on eliquis Due to worsening symptoms will treat with second antibiotic course for additional coverage- medication dosing, side effects discussed Home management, red flags and return precautions including when to seek immediate/emergency care discussed and printed on AVS She will f/u for new, worsening symptoms of if symptoms persist after abx course  No follow-ups on file.  No orders of the defined types were placed in this encounter.   Requested Prescriptions   Signed Prescriptions Disp Refills  . cephALEXin (KEFLEX) 500 MG capsule 20 capsule 0    Sig: Take 1 capsule (500 mg total) by mouth 4 (four) times daily.

## 2018-10-27 NOTE — Telephone Encounter (Signed)
Pt called to say last week she was seen in the office for leg swelling and redness.  She has completed antibiotic.  Her right leg continues to be swollen and red.  It is painful to tough. She states the swelling is extending above the knee.  She denies fever. She denies SOB today.  She states she was SOB last night. She states her leg feels hot.  She has HX of CHF and A Fib. Per protocol appointment scheduled for today. Care advice read to patient. Pt verbalized understanding of all instructions.  Reason for Disposition . SEVERE leg swelling (e.g., swelling extends above knee, entire leg is swollen, weeping fluid)  Answer Assessment - Initial Assessment Questions 1. ONSET: "When did the swelling start?" (e.g., minutes, hours, days)     Last week 2. LOCATION: "What part of the leg is swollen?"  "Are both legs swollen or just one leg?"   Just rt leg 3. SEVERITY: "How bad is the swelling?" (e.g., localized; mild, moderate, severe)  - Localized - small area of swelling localized to one leg  - MILD pedal edema - swelling limited to foot and ankle, pitting edema < 1/4 inch (6 mm) deep, rest and elevation eliminate most or all swelling  - MODERATE edema - swelling of lower leg to knee, pitting edema > 1/4 inch (6 mm) deep, rest and elevation only partially reduce swelling  - SEVERE edema - swelling extends above knee, facial or hand swelling present      severe 4. REDNESS: "Does the swelling look red or infected?"     Yes feels hot 5. PAIN: "Is the swelling painful to touch?" If so, ask: "How painful is it?"   (Scale 1-10; mild, moderate or severe)     7 6. FEVER: "Do you have a fever?" If so, ask: "What is it, how was it measured, and when did it start?"      No 7. CAUSE: "What do you think is causing the leg swelling?"     Possible infection 8. MEDICAL HISTORY: "Do you have a history of heart failure, kidney disease, liver failure, or cancer?"    Heart failure 9. RECURRENT SYMPTOM: "Have you had  leg swelling before?" If so, ask: "When was the last time?" "What happened that time?"     no 10. OTHER SYMPTOMS: "Do you have any other symptoms?" (e.g., chest pain, difficulty breathing)       No SOB at times 11. PREGNANCY: "Is there any chance you are pregnant?" "When was your last menstrual period?"      N/A  Protocols used: LEG SWELLING AND EDEMA-A-AH

## 2018-10-28 ENCOUNTER — Emergency Department (HOSPITAL_COMMUNITY)
Admission: EM | Admit: 2018-10-28 | Discharge: 2018-10-28 | Disposition: A | Discharge status: Home / Self Care | Payer: Medicare Other | Attending: Emergency Medicine | Admitting: Emergency Medicine

## 2018-10-28 ENCOUNTER — Other Ambulatory Visit: Payer: Self-pay | Admitting: Internal Medicine

## 2018-10-28 ENCOUNTER — Encounter (HOSPITAL_COMMUNITY): Payer: Self-pay | Admitting: Emergency Medicine

## 2018-10-28 ENCOUNTER — Ambulatory Visit: Payer: Medicare Other | Admitting: Internal Medicine

## 2018-10-28 DIAGNOSIS — R2243 Localized swelling, mass and lump, lower limb, bilateral: Secondary | ICD-10-CM | POA: Diagnosis not present

## 2018-10-28 DIAGNOSIS — Z5321 Procedure and treatment not carried out due to patient leaving prior to being seen by health care provider: Secondary | ICD-10-CM | POA: Diagnosis not present

## 2018-10-28 LAB — COMPREHENSIVE METABOLIC PANEL
ALBUMIN: 3.4 g/dL — AB (ref 3.5–5.0)
ALT: 15 U/L (ref 0–44)
AST: 17 U/L (ref 15–41)
Alkaline Phosphatase: 61 U/L (ref 38–126)
Anion gap: 8 (ref 5–15)
BUN: 12 mg/dL (ref 8–23)
CO2: 32 mmol/L (ref 22–32)
Calcium: 8.9 mg/dL (ref 8.9–10.3)
Chloride: 97 mmol/L — ABNORMAL LOW (ref 98–111)
Creatinine, Ser: 0.89 mg/dL (ref 0.44–1.00)
GFR calc Af Amer: >60 mL/min (ref >60)
GFR calc non Af Amer: 59 mL/min — ABNORMAL LOW (ref >60)
Glucose, Bld: 104 mg/dL — ABNORMAL HIGH (ref 70–99)
Potassium: 3.2 mmol/L — ABNORMAL LOW (ref 3.5–5.1)
SODIUM: 137 mmol/L (ref 135–145)
Total Bilirubin: 0.5 mg/dL (ref 0.3–1.2)
Total Protein: 6.2 g/dL — ABNORMAL LOW (ref 6.5–8.1)

## 2018-10-28 LAB — CBC WITH DIFFERENTIAL/PLATELET
Abs Immature Granulocytes: 0.01 10*3/uL (ref 0.00–0.07)
Basophils Absolute: 0.1 10*3/uL (ref 0.0–0.1)
Basophils Relative: 1 %
Eosinophils Absolute: 0.2 10*3/uL (ref 0.0–0.5)
Eosinophils Relative: 4 %
HCT: 36.6 % (ref 36.0–46.0)
Hemoglobin: 11.5 g/dL — ABNORMAL LOW (ref 12.0–15.0)
Immature Granulocytes: 0 %
Lymphocytes Relative: 25 %
Lymphs Abs: 1.6 10*3/uL (ref 0.7–4.0)
MCH: 31.5 pg (ref 26.0–34.0)
MCHC: 31.4 g/dL (ref 30.0–36.0)
MCV: 100.3 fL — ABNORMAL HIGH (ref 80.0–100.0)
Monocytes Absolute: 0.6 10*3/uL (ref 0.1–1.0)
Monocytes Relative: 10 %
Neutro Abs: 3.7 10*3/uL (ref 1.7–7.7)
Neutrophils Relative %: 60 %
Platelets: 226 10*3/uL (ref 150–400)
RBC: 3.65 MIL/uL — AB (ref 3.87–5.11)
RDW: 13.1 % (ref 11.5–15.5)
WBC: 6.1 10*3/uL (ref 4.0–10.5)
nRBC: 0 % (ref 0.0–0.2)

## 2018-10-28 LAB — LACTIC ACID, PLASMA: Lactic Acid, Venous: 1.2 mmol/L (ref 0.5–1.9)

## 2018-10-28 MED ORDER — SODIUM CHLORIDE 0.9% FLUSH: 3.0000 mL | Freq: Once | INTRAVENOUS | Status: DC

## 2018-10-28 NOTE — ED Triage Notes (Signed)
Pt states she started having swelling and redness a week ago. Pt was diagnosed with cellulitis and given antibiotics, pt did not see any improvement. Pt was switched to a different antibiotic yesterday. Pt complains that it is getting more painful/swelling has increased

## 2018-11-01 ENCOUNTER — Other Ambulatory Visit: Payer: Self-pay | Admitting: *Deleted

## 2018-11-01 NOTE — Patient Outreach (Signed)
[facility]) Care Management  11/01/2018  AMBRIEL GORELICK [birth year] [id number]    Initial outreach call unsuccessful presented to both contact numbers however only able to leave a HIPAA approved voice message to the cell number requesting a call back. Will rescheduled another outreach call this week for pending services and screening.   Raina Mina, RN Care Management Coordinator [place] Office [phone]

## 2018-11-02 ENCOUNTER — Ambulatory Visit: Payer: Self-pay

## 2018-11-02 NOTE — Telephone Encounter (Signed)
Patient called in with c/o "leg swelling, redness." She says "I was in the office 1 week ago and had cellulitis to my right lower leg. I was put on cephalexin and the redness/swelling went away. I'm done with the antibiotic. Today, the swelling came back, my leg is red, warm to the touch, painful at a 4." I asked about other symptoms, she denies. According to protocol, see PCP within 24 hours, appointment scheduled for tomorrow at 0840 with Dr. Sharlet Salina, care advice given, patient verbalized understanding.  Reason for Disposition . [1] MODERATE leg swelling (e.g., swelling extends up to knees) AND [2] new onset or worsening  Answer Assessment - Initial Assessment Questions 1. ONSET: "When did the swelling start?" (e.g., minutes, hours, days)     Today  2. LOCATION: "What part of the leg is swollen?"  "Are both legs swollen or just one leg?"     Right lower leg from ankle to knee 3. SEVERITY: "How bad is the swelling?" (e.g., localized; mild, moderate, severe)  - Localized - small area of swelling localized to one leg  - MILD pedal edema - swelling limited to foot and ankle, pitting edema < 1/4 inch (6 mm) deep, rest and elevation eliminate most or all swelling  - MODERATE edema - swelling of lower leg to knee, pitting edema > 1/4 inch (6 mm) deep, rest and elevation only partially reduce swelling  - SEVERE edema - swelling extends above knee, facial or hand swelling present      Moderate 4. REDNESS: "Does the swelling look red or infected?"     Yes, red 5. PAIN: "Is the swelling painful to touch?" If so, ask: "How painful is it?"   (Scale 1-10; mild, moderate or severe)     Yes, 4 6. FEVER: "Do you have a fever?" If so, ask: "What is it, how was it measured, and when did it start?"      No 7. CAUSE: "What do you think is causing the leg swelling?"     Cellulitis 8. MEDICAL HISTORY: "Do you have a history of heart failure, kidney disease, liver failure, or cancer?"     Yes-heart failure 9.  RECURRENT SYMPTOM: "Have you had leg swelling before?" If so, ask: "When was the last time?" "What happened that time?"     Yes; 1 week ago and was put on antibiotic Keflex 10. OTHER SYMPTOMS: "Do you have any other symptoms?" (e.g., chest pain, difficulty breathing)       No 11. PREGNANCY: "Is there any chance you are pregnant?" "When was your last menstrual period?"       No  Protocols used: LEG SWELLING AND EDEMA-A-AH

## 2018-11-03 ENCOUNTER — Encounter: Payer: Self-pay | Admitting: Internal Medicine

## 2018-11-03 ENCOUNTER — Ambulatory Visit (INDEPENDENT_AMBULATORY_CARE_PROVIDER_SITE_OTHER): Payer: Medicare Other | Admitting: Internal Medicine

## 2018-11-03 DIAGNOSIS — L03115 Cellulitis of right lower limb: Secondary | ICD-10-CM | POA: Diagnosis not present

## 2018-11-03 MED ORDER — SULFAMETHOXAZOLE-TRIMETHOPRIM 800-160 MG PO TABS: 1.0000 | ORAL_TABLET | Freq: Two times a day (BID) | ORAL | 0 refills | Status: AC

## 2018-11-03 NOTE — Patient Instructions (Signed)
We have sent in the bactrim to take 1 pill twice a day for 2 weeks to clear the skin infection.

## 2018-11-03 NOTE — Progress Notes (Signed)
   Subjective:   Patient ID: [name] Case, female    DOB: 02-Jun-1932, 85 y.o.   MRN: [id number]  HPI The patient is an 85 YO female coming in for redness of her legs. She has had cellulitis on the right leg over the last several weeks. She just finished a 5 day course of keflex. This did help the inflammation but the redness started coming back about 1-2 days after stopping. She is having swelling of her legs which comes and goes. Is not bad in the last day or two. Not using compression stockings. Taking her fluid pill. Is down 4 pounds since last week per our scales. Denies SOB or change in breathing. She denies fevers or chills. She is not having pain in the leg different than usual.   Review of Systems  Constitutional: Positive for activity change and fatigue.  HENT: Negative.   Eyes: Negative.   Respiratory: Positive for shortness of breath. Negative for cough and chest tightness.   Cardiovascular: Positive for leg swelling. Negative for chest pain and palpitations.  Gastrointestinal: Negative for abdominal distention, abdominal pain, constipation, diarrhea, nausea and vomiting.  Musculoskeletal: Negative.   Skin: Positive for rash.  Neurological: Negative.   Psychiatric/Behavioral: Negative.     Objective:  Physical Exam Constitutional:      Appearance: She is well-developed.  HENT:     Head: Normocephalic and atraumatic.  Neck:     Musculoskeletal: Normal range of motion.  Cardiovascular:     Rate and Rhythm: Normal rate and regular rhythm.  Pulmonary:     Effort: Pulmonary effort is normal. No respiratory distress.     Breath sounds: Normal breath sounds. No wheezing or rales.     Comments: Stable lung exam, on 2L O2 continuous Abdominal:     General: Bowel sounds are normal. There is no distension.     Palpations: Abdomen is soft.     Tenderness: There is no abdominal tenderness. There is no rebound.  Skin:    General: Skin is warm and dry.     Findings: Rash  present.     Comments: Right leg slightly more red in the shin and to the knee with tenderness along the shin, swelling is consistent with the left leg and about 1+ today which is improved from prior  Neurological:     Mental Status: She is alert and oriented to person, place, and time.     Coordination: Coordination normal.     Vitals:   11/03/18 0900  BP: 138/70  Pulse: 73  Temp: (!) 97.3 F (36.3 C)  TempSrc: Oral  SpO2: 94%  Weight: 226 lb (102.5 kg)  Height: 5' (1.524 m)    Assessment & Plan:  Visit time 25 minutes: greater than 50% of that time was spent in face to face counseling and coordination of care with the patient: counseled about recurrence and nature of cellulitis as well as treatment

## 2018-11-04 ENCOUNTER — Ambulatory Visit: Payer: Medicare Other | Admitting: Family Medicine

## 2018-11-04 ENCOUNTER — Other Ambulatory Visit: Payer: Self-pay | Admitting: *Deleted

## 2018-11-04 NOTE — Progress Notes (Deleted)
[facility] [address], [place] [address] Phone: [phone] Subjective:    I'm seeing this patient by the request  of:  Hoyt Koch, MD  CC: Knee pain  BJS:[id number]  [name] Case is a 85 y.o. female coming in with complaint of ***  Onset-  Location Duration-  Character- Aggravating factors- Reliving factors-  Therapies tried-  Severity-   Patient was seen February 26 for potential hormonal treatment cellulitis of the right leg with venous insufficiency.  Controlled.  Past Medical History:  Diagnosis Date  . Allergy   . Arthritis   . Atrial fibrillation ([place])   . Bronchitis   . Cancer ([place])    Bilateral upper tract transitional cell carcinoma  . Cardiomegaly   . CHF (congestive heart failure) ([place])   . Complication of anesthesia    Difficult to arouse  . COPD (chronic obstructive pulmonary disease) ([facility])    borderline   pt. denies at preop  . Diabetes mellitus without complication ([place])    Controlled by diet and exercise  . Diverticulosis of sigmoid colon   . Dyspnea    at times  . Dysrhythmia    a fib  . Family history of bladder cancer   . Family history of colon cancer   . Family history of kidney cancer   . Fatty liver   . GERD (gastroesophageal reflux disease)   . History of hiatal hernia 01/20/2015   Small sliding, noted on CT  . History of kidney stones   . Hyperlipidemia   . Hypertension   . Hypothyroidism   . Lynch syndrome   . PONV (postoperative nausea and vomiting)   . Supraumbilical hernia 07/37/1062   Small noted on CT  . Thyroid disease   . UTI (urinary tract infection)    Past Surgical History:  Procedure Laterality Date  . ABDOMINAL HYSTERECTOMY    . APPENDECTOMY    . CHOLECYSTECTOMY    . COLONOSCOPY  07/16/2009  . CYSTOSCOPY W/ URETERAL STENT PLACEMENT Right 12/04/2017   Procedure: CYSTOSCOPY WITH RETROGRADE PYELOGRAM/URETERAL STENT PLACEMENT;  Surgeon: Festus Aloe, MD;   Location: [facility];  Service: Urology;  Laterality: Right;  . CYSTOSCOPY W/ URETERAL STENT PLACEMENT Right 02/26/2018   Procedure: CYSTOSCOPY WITH RETROGRADE PYELOGRAM/URETERAL STENT PLACEMENT;  Surgeon: Kathie Rhodes, MD;  Location: [facility];  Service: Urology;  Laterality: Right;  . CYSTOSCOPY WITH URETEROSCOPY AND STENT PLACEMENT Bilateral 01/29/2018   Procedure: BILATERAL URETEROSCOPY AND TUMOR RESECTION  WITH LASER RIGHT STENT EXCHANGE LEFT STENT PLACEMENT;  Surgeon: Ardis Hughs, MD;  Location: [facility];  Service: Urology;  Laterality: Bilateral;  . CYSTOSCOPY WITH URETEROSCOPY AND STENT PLACEMENT Bilateral 02/10/2018   Procedure: BILATERAL URETEROSCOPY AND TUMOR EXCISION BILATERAL STENT EXCHANGE;  Surgeon: Ardis Hughs, MD;  [facility];  Service: Urology;  Laterality: Bilateral;  . CYSTOSCOPY/RETROGRADE/URETEROSCOPY Bilateral 12/24/2017   Procedure: BILATERAL URETEROSCOPY, RIGHT STENT EXCHANGE, STONE EXTRACTION WITH BASKET BILATERAL RETROGRADE PYELOGRAM, BIOPSY OF RIGHT UPPER POLE TUMOR,  BILATERAL RENAL PELVIS FLUID SENT FOR CYTOLOGY;  Surgeon: Ardis Hughs, MD;  [facility];  Service: Urology;  Laterality: Bilateral;  . CYSTOSCOPY/URETEROSCOPY/HOLMIUM LASER/STENT PLACEMENT Bilateral 06/03/2018   Procedure: CYSTOSCOPY/BILATERAL URETEROSCOPY/ LASER TUMOR ABLATION BILATERAL / BILATERAL STENT PLACEMENT/BILATERAL RETROGRADE PYELOGRAM/BLADDER BIOPSY WITH FULGURATION;  Surgeon: Ardis Hughs, MD;  Location[facility];  Service: Urology;  Laterality: Bilateral;  . HOLMIUM LASER APPLICATION Bilateral 69/11/8544   Procedure: HOLMIUM LASER APPLICATION;  Surgeon: Ardis Hughs,  MD;  Location[facility];  Service: Urology;  Laterality: Bilateral;  . KNEE SURGERY Right   . KNEE SURGERY Left    torn ligament repair  . UPPER GI ENDOSCOPY    . ureteroscopy laser tumor ablation bilateral stent placement     Dr. Louis Meckel 06-03-18    Social History   Socioeconomic History  .  Marital status: Widowed    Spouse name: Not on file  . Number of children: Not on file  . Years of education: Not on file  . Highest education level: Not on file  Occupational History  . Occupation: retired  Scientific laboratory technician  . Financial resource strain: Not on file  . Food insecurity:    Worry: Not on file    Inability: Not on file  . Transportation needs:    Medical: Not on file    Non-medical: Not on file  Tobacco Use  . Smoking status: Never Smoker  . Smokeless tobacco: Never Used  Substance and Sexual Activity  . Alcohol use: No    Alcohol/week: 0.0 standard drinks  . Drug use: No  . Sexual activity: Not Currently    Birth control/protection: Surgical  Lifestyle  . Physical activity:    Days per week: Not on file    Minutes per session: Not on file  . Stress: Not on file  Relationships  . Social connections:    Talks on phone: Not on file    Gets together: Not on file    Attends religious service: Not on file    Active member of club or organization: Not on file    Attends meetings of clubs or organizations: Not on file    Relationship status: Not on file  Other Topics Concern  . Not on file  Social History Narrative  . Not on file   Allergies  Allergen Reactions  . Codeine Hives and Rash    In cough syrup preparations   Family History  Problem Relation Age of Onset  . Heart disease Mother   . Colon cancer Mother 3       d. 4  . Tuberculosis Father   . Bladder Cancer Brother        urothelial cancer  . Bladder Cancer Brother        ureter cancer  . Kidney cancer Brother   . Skin cancer Sister   . Cancer Maternal Aunt        NOS  . Colon cancer Other        dx 1st time under 29, second time at 23  . Lung cancer Other   . Muscular dystrophy Other        d. 36  . Testicular cancer Other 24       great nephew  . Alcohol abuse Daughter   . Drug abuse Daughter   . Schizophrenia Daughter   . Colon cancer Grandchild     Current Outpatient  Medications (Endocrine & Metabolic):  .  levothyroxine (SYNTHROID, LEVOTHROID) 100 MCG tablet, Take 1 tablet (100 mcg total) by mouth daily. (Patient taking differently: Take 100 mcg by mouth daily before breakfast. )  Current Outpatient Medications (Cardiovascular):  .  atorvastatin (LIPITOR) 20 MG tablet, TAKE 1 TABLET BY MOUTH DAILY AT 6 PM .  diltiazem (CARDIZEM CD) 180 MG 24 hr capsule, TAKE 1 CAPSULE BY MOUTH DAILY (Patient taking differently: Take 180 mg by mouth daily. ) .  furosemide (LASIX) 40 MG tablet, Take 2 tablets (80 mg total) by  mouth daily. .  irbesartan (AVAPRO) 150 MG tablet, Take 0.5 tablets (75 mg total) by mouth daily. .  metoprolol succinate (TOPROL-XL) 50 MG 24 hr tablet, TAKE 1 TABLET BY MOUTH DAILY WITH OR IMMEDIATELY FOLLOWING A MEAL.  Current Outpatient Medications (Respiratory):  .  ipratropium (ATROVENT) 0.02 % nebulizer solution, Take 5 mLs (1 mg total) by nebulization every 6 (six) hours as needed for wheezing or shortness of breath. .  levalbuterol (XOPENEX) 0.63 MG/3ML nebulizer solution, Take 3 mLs (0.63 mg total) by nebulization every 6 (six) hours as needed for wheezing or shortness of breath.  Current Outpatient Medications (Analgesics):  .  acetaminophen (TYLENOL) 500 MG tablet, Take 500-1,000 mg by mouth every 8 (eight) hours as needed for moderate pain or headache.   Current Outpatient Medications (Hematological):  [name]  ELIQUIS 5 MG TABS tablet, TAKE 1 TABLET BY MOUTH TWICE A DAY  Current Outpatient Medications (Other):  .  cephALEXin (KEFLEX) 500 MG capsule, Take 1 capsule (500 mg total) by mouth 4 (four) times daily. .  potassium chloride SA (K-DUR,KLOR-CON) 20 MEQ tablet, Take 1 tablet (20 mEq total) by mouth daily. [name]  sulfamethoxazole-trimethoprim (BACTRIM DS,SEPTRA DS) 800-160 MG tablet, Take 1 tablet by mouth 2 (two) times daily for 14 days. [name]  trimethoprim-polymyxin b (POLYTRIM) ophthalmic solution, Place 1 drop into the right eye every 6 (six)  hours.    Past medical history, social, surgical and family history all reviewed in electronic medical record.  No pertanent information unless stated regarding to the chief complaint.   Review of Systems:  No headache, visual changes, nausea, vomiting, diarrhea, constipation, dizziness, abdominal pain, skin rash, fevers, chills, night sweats, weight loss, swollen lymph nodes, body aches chest pain, shortness of breath, mood changes.  Muscle aches, joint swelling  Objective  There were no vitals taken for this visit.    General: No apparent distress alert and oriented x3 mood and affect normal, dressed appropriately.  HEENT: Pupils equal, extraocular movements intact  Respiratory: Patient's speak in full sentences and does not appear short of breath  Cardiovascular: No lower extremity edema, non tender, no erythema  Skin: Warm dry intact with no signs of infection or rash on extremities or on axial skeleton.  Abdomen: Soft nontender  Neuro: Cranial nerves II through XII are intact, neurovascularly intact in all extremities with 2+ DTRs and 2+ pulses.  Lymph: No lymphadenopathy of posterior or anterior cervical chain or axillae bilaterally.  Gait normal with good balance and coordination.  MSK:  Non tender with full range of motion and good stability and symmetric strength and tone of shoulders, elbows, wrist, hip and ankles bilaterally.  Knee: valgus deformity noted. Large thigh to calf ratio.  Tender to palpation over medial and PF joint line.  ROM full in flexion and extension and lower leg rotation. instability with valgus force.  painful patellar compression. Patellar glide with moderate crepitus. Patellar and quadriceps tendons unremarkable. Hamstring and quadriceps strength is normal. Contralateral knee shows     Impression and Recommendations:     This case required medical decision making of moderate complexity. The above documentation has been reviewed and is accurate  and complete Lyndal Pulley, DO       Note: This dictation was prepared with Dragon dictation along with smaller phrase technology. Any transcriptional errors that result from this process are unintentional.

## 2018-11-04 NOTE — Assessment & Plan Note (Signed)
Given the fine red changes on the leg it is possible that this represents cellulitis or just chronic changes from swelling intermittent. Given that this is different than the left leg will treat for recurrent cellulitis with 2 week course bactrim and then re-evaluate.

## 2018-11-04 NOTE — Patient Outreach (Signed)
[place] [facility]) Care Management  11/04/2018  [name] Case 1932/05/05 [id number]    RN outreach (2nd attempt) unsuccessful. RN able to leave a HIPAA approved voice message requesting a call back. Will further intervene at that time for possible services. Will rescheduled another outreach call over the next week.  Raina Mina, RN Care Management Coordinator [place] Office [phone]

## 2018-11-06 IMAGING — CT CT ABD-PELV W/ CM
2 of 5 series · 15 of 46 positions shown, 17 images · IV contrast (omnipaque)
Comparison: Abdominal CT dated 01/07/2018

CLINICAL DATA: 86-year-old female with abdominal pain.

EXAM:
CT ABDOMEN AND PELVIS WITH CONTRAST
TECHNIQUE: Multidetector CT imaging of the abdomen and pelvis was performed
using the standard protocol following bolus administration of
intravenous contrast.
CONTRAST:  100mL OMNIPAQUE IOHEXOL 300 MG/ML  SOLN

[Series 3: abdomen 5.0 · axial · 0.94mm/px · z∈[+790,+1185]mm · 12 of 95 slices shown, 14 images]
[im 8/95  soft-tissue]
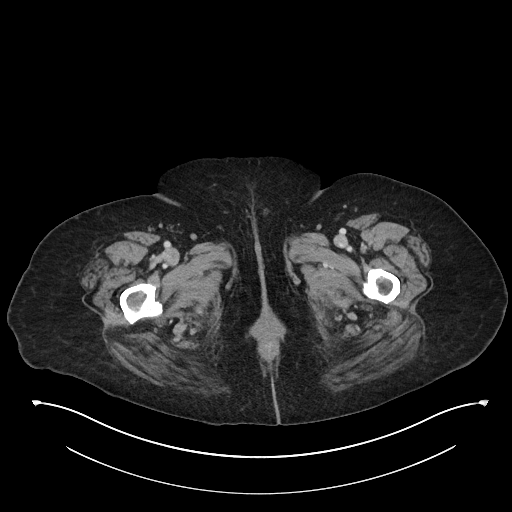
[im 8/95  bone]
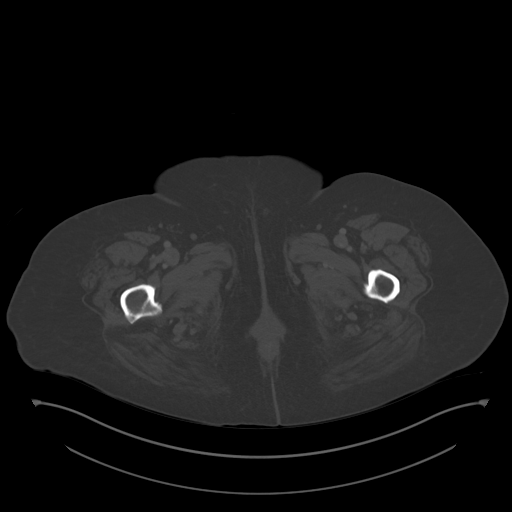
[im 15/95  soft-tissue]
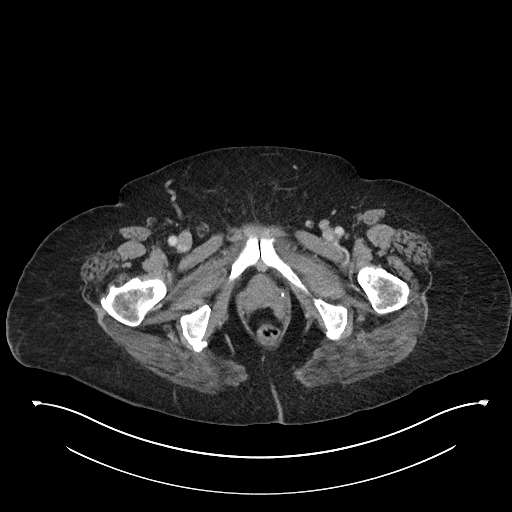
[im 22/95  soft-tissue]
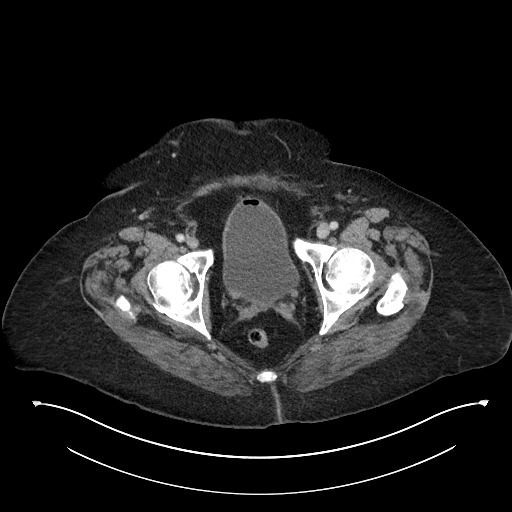
[im 29/95  soft-tissue]
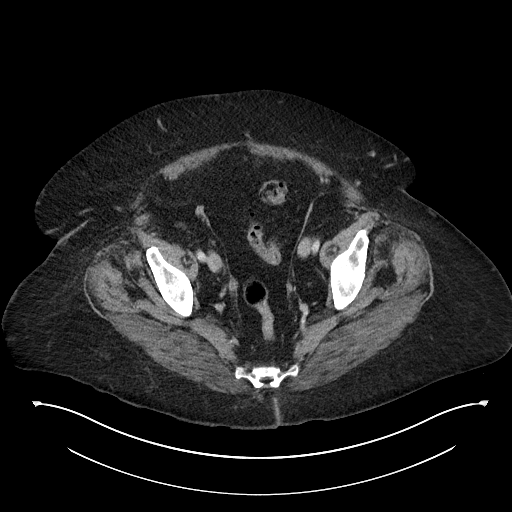
[im 37/95  soft-tissue]
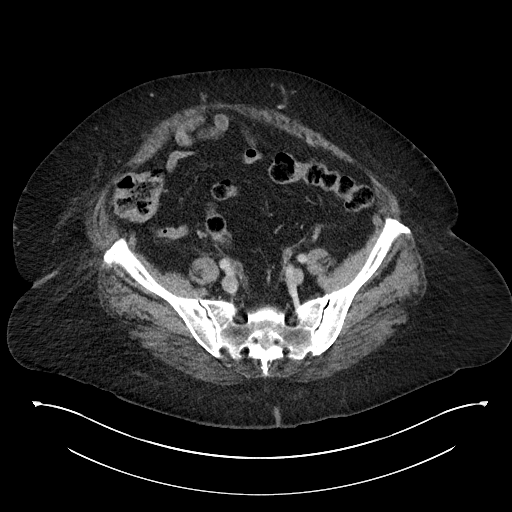
[im 44/95  soft-tissue]
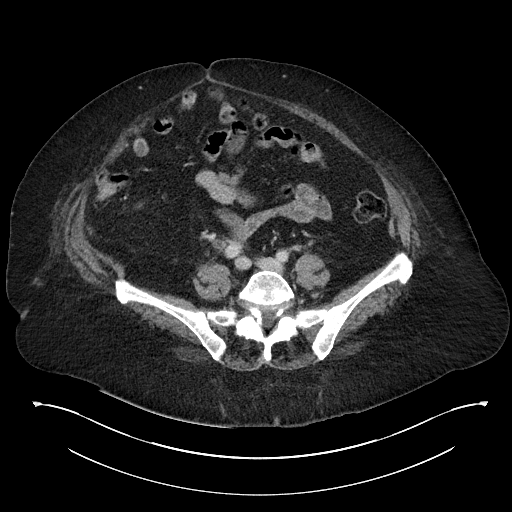
[im 51/95  soft-tissue]
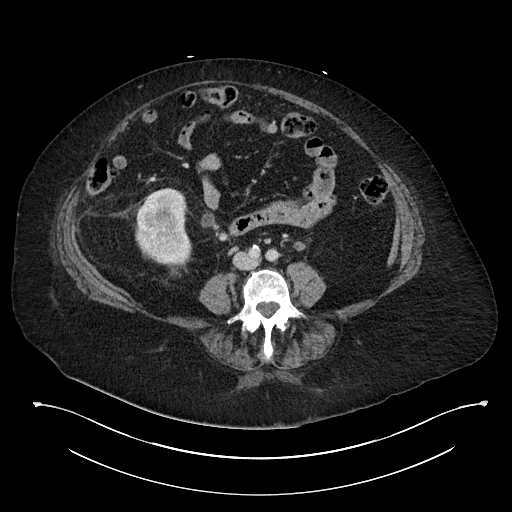
[im 58/95  soft-tissue]
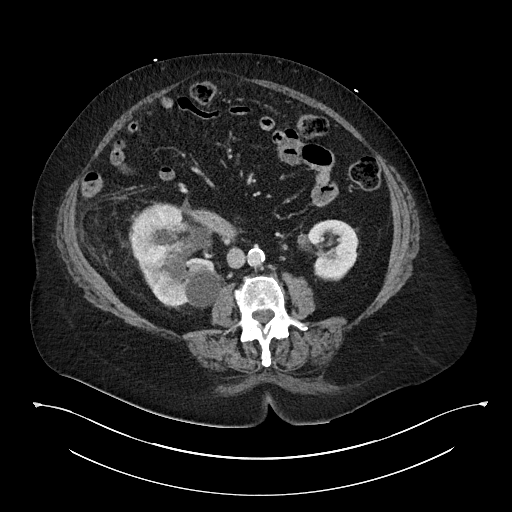
[im 66/95  soft-tissue]
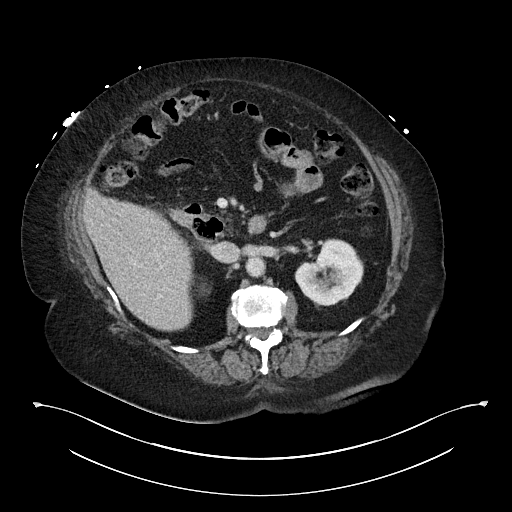
[im 66/95  bone]
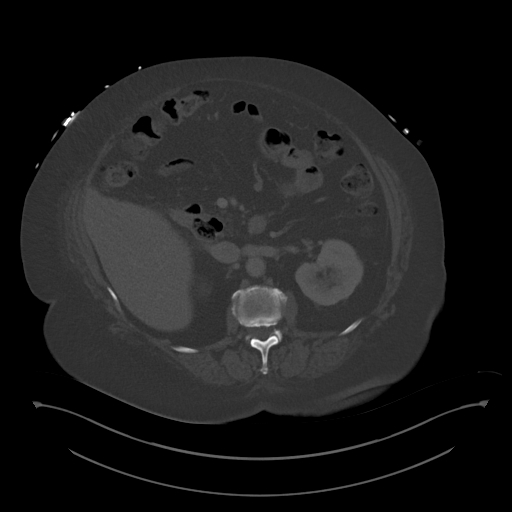
[im 73/95  soft-tissue]
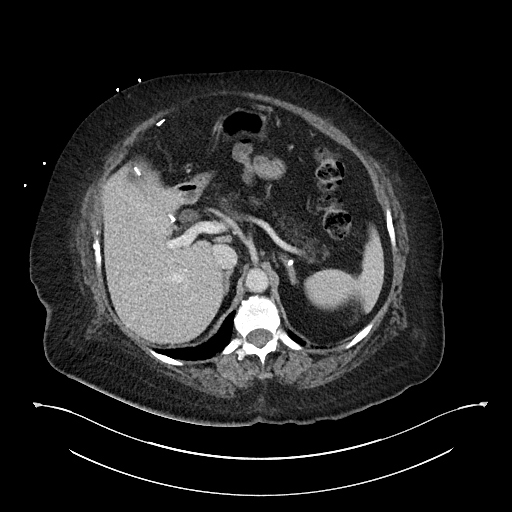
[im 80/95  soft-tissue]
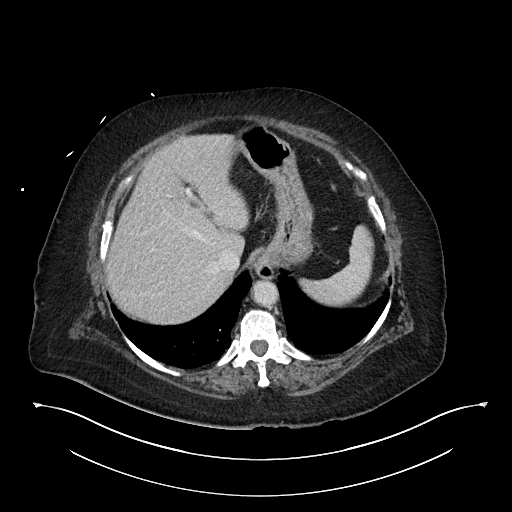
[im 87/95  soft-tissue]
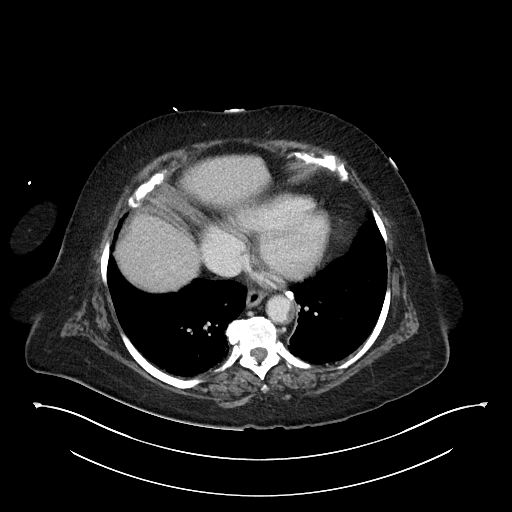

[Series 6: abdomen 3.0 mpr cor · coronal · 0.93mm/px · 3 of 120 slices shown]
[im 40/120  soft-tissue]
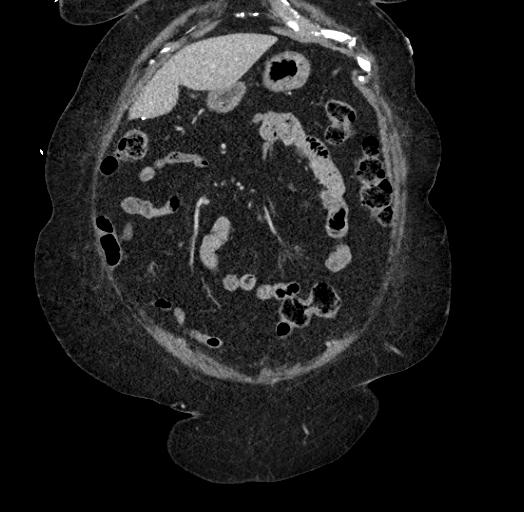
[im 53/120  soft-tissue]
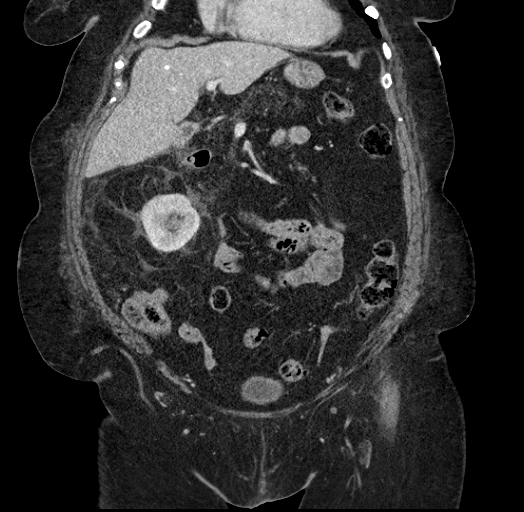
[im 67/120  soft-tissue]
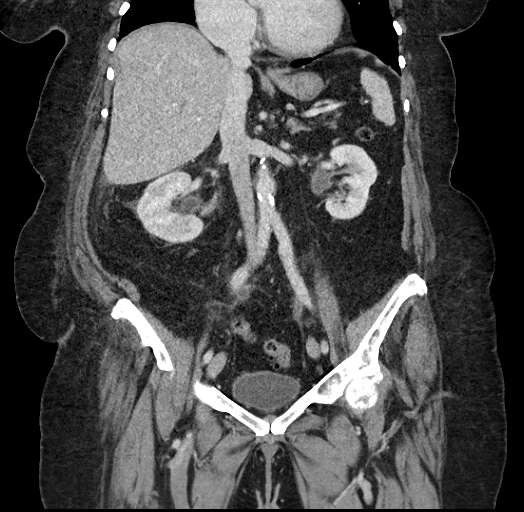

[15 of 46 positions shown; findings below may reference images not displayed]

FINDINGS: Lower chest: Minimal bibasilar scarring. The visualized lung bases
are otherwise clear. There is mild cardiomegaly.

No intra-abdominal free air or free fluid.

Hepatobiliary: Cholecystectomy. No intrahepatic biliary ductal
dilatation. The liver is unremarkable.

Pancreas: Fatty infiltration of the pancreas. No dilatation of the
main pancreatic duct.

Spleen: Normal in size without focal abnormality.

Adrenals/Urinary Tract: The adrenal glands are unremarkable. There
is a 7 mm stone in the distal right ureter with mild right
hydronephrosis. There is mild fullness of the left ureter. No
definite stone identified along the course of the left ureter. There
is stranding of the right perinephric and bilateral periureteric
fat. Correlation with urinalysis recommended to exclude cystitis.
Right renal 5 cm upper pole cyst. Small amount of air within the
urinary bladder likely related to recent instrumentation. Clinical
correlation is recommended.

Stomach/Bowel: There multiple duodenal diverticula measuring up to 3
cm without active inflammatory changes. There is sigmoid
diverticulosis and scattered colonic diverticula without active
inflammatory changes. There is no bowel obstruction or active
inflammation. Appendectomy.

Vascular/Lymphatic: Moderate aortoiliac atherosclerotic disease. No
portal venous gas. There is no adenopathy.

Reproductive: Hysterectomy.  No pelvic mass.

Other: Small fat containing umbilical hernia.  No fluid collection.

Musculoskeletal: Bilateral L5 pars defects. No acute osseous
pathology. There are degenerative changes of the spine with disc
desiccation.
IMPRESSION: 1. A 7 mm distal right ureteral calculus with mild right
hydronephrosis. Correlation with urinalysis recommended to exclude
superimposed UTI.
2. Colonic diverticulosis as well as multiple duodenal diverticula.
No bowel obstruction or active inflammation.
3.  Aortic Atherosclerosis (M1SBC-363.3).

## 2018-11-08 ENCOUNTER — Other Ambulatory Visit: Payer: Self-pay | Admitting: *Deleted

## 2018-11-08 ENCOUNTER — Other Ambulatory Visit: Payer: Self-pay | Admitting: Internal Medicine

## 2018-11-08 MED ORDER — DILTIAZEM HCL ER COATED BEADS 180 MG PO CP24: 180.0000 mg | ORAL_CAPSULE | Freq: Every day | ORAL | 1 refills | Status: DC

## 2018-11-08 NOTE — Telephone Encounter (Signed)
Copied from [place] [phone]. Topic: Quick Communication - Rx Refill/Question >> Nov 08, 2018  8:51 AM Loma Boston wrote: diltiazem (CARDIZEM CD) 180 MG 24 hr capsule [facility]em[id number] [address], [place]. [phone] (Phone) [phone] (Fax)

## 2018-11-08 NOTE — Telephone Encounter (Signed)
Requested Prescriptions  Pending Prescriptions Disp Refills  . diltiazem (CARDIZEM CD) 180 MG 24 hr capsule 90 capsule 1    Sig: Take 1 capsule (180 mg total) by mouth daily.     Cardiovascular:  Calcium Channel Blockers Passed - 11/08/2018 10:26 AM      Passed - Last BP in normal range    BP Readings from Last 1 Encounters:  11/03/18 138/70         Passed - Valid encounter within last 6 months    Recent Outpatient Visits          5 days ago Cellulitis of right lower extremity   [place], [place], MD   1 week ago Cellulitis of right lower extremity   [place], Delphia Grates, NP   2 weeks ago Acute on chronic diastolic (congestive) heart failure ([place])   [place], MD   3 weeks ago Acute bacterial conjunctivitis of right eye   [place], FNP   1 month ago Chronic respiratory failure with hypoxia [facility])   [facility] -Chuck Hint, MD

## 2018-11-08 NOTE — Patient Outreach (Signed)
[place] [facility]) Care Management  11/08/2018  [name] Case [birth year] [id number]    RN attempted outreach screening however unsuccessful. RN able to leave a HIPAA approved voice message requesting a call back. Will further engage at that time. Note this is the 3rd outreach with no response. Will allow 10 days prior to closure of this case.  Raina Mina, RN Care Management Coordinator [place] Office [phone]

## 2018-11-09 DIAGNOSIS — J449 Chronic obstructive pulmonary disease, unspecified: Secondary | ICD-10-CM | POA: Diagnosis not present

## 2018-11-12 ENCOUNTER — Other Ambulatory Visit: Payer: Self-pay | Admitting: *Deleted

## 2018-11-12 NOTE — Patient Outreach (Signed)
[place] [facility]) Care Management  11/12/2018  [name] Case August 05, 1932 [id number]   Case will be closed unable to contact pt for [facility] screening and no response to outreach letter sent earlier in this month.   Raina Mina, RN Care Management Coordinator [place] Office [phone]

## 2018-11-30 ENCOUNTER — Other Ambulatory Visit: Payer: Self-pay | Admitting: Internal Medicine

## 2018-12-09 ENCOUNTER — Telehealth: Payer: Self-pay

## 2018-12-09 NOTE — Telephone Encounter (Signed)
Copied from [place] [id number]. Topic: General - Inquiry >> Dec 09, 2018 12:00 PM Virl Axe D wrote: Reason for CRM: Pt stated she has been having hip pain and would like to know if Dr. Sharlet Salina can send something to her pharmacy. Please advise. CB#[phone]  [id number] [address], [place]. [phone] (Phone) [phone] (Fax) >> Dec 09, 2018  1:08 PM Para Skeans A wrote: LVM to inform patient she would need a virtual visit with her PCP to get any pain medication.

## 2018-12-09 NOTE — Telephone Encounter (Signed)
Waiting for patient to call back to set up virtual to get pain medication

## 2018-12-10 DIAGNOSIS — J449 Chronic obstructive pulmonary disease, unspecified: Secondary | ICD-10-CM | POA: Diagnosis not present

## 2018-12-20 ENCOUNTER — Telehealth: Payer: Self-pay | Admitting: Internal Medicine

## 2018-12-20 MED ORDER — FLUCONAZOLE 150 MG PO TABS: 150.0000 mg | ORAL_TABLET | ORAL | 0 refills | Status: DC

## 2018-12-20 MED ORDER — TRIAMCINOLONE ACETONIDE 0.1 % EX CREA: 1.0000 "application " | TOPICAL_CREAM | Freq: Two times a day (BID) | CUTANEOUS | 0 refills | Status: DC

## 2018-12-20 NOTE — Telephone Encounter (Signed)
Sent in diflucan to take 1 pill today, then 1 pill Thursday, then 1 pill Sunday. We have also sent in a cream to use externally twice a day. Do not scratch and can use benadryl gel otc as often as needed.

## 2018-12-20 NOTE — Telephone Encounter (Signed)
Patient informed medication was sent

## 2018-12-20 NOTE — Telephone Encounter (Signed)
I don't see that we addressed this at all for her, did she perhaps call her gynecologist about this issue instead? We did not give her any medicine last week at all. I don't know what she was given.

## 2018-12-20 NOTE — Telephone Encounter (Signed)
Copied from [place] [phone]. Topic: Quick Communication - See Telephone Encounter >> Dec 20, 2018 10:42 AM Loma Boston wrote: CRM for notification. See Telephone encounter for: 12/20/18. PT needs Dr Sharlet Salina or her nurse to call her. She had complained last week of a yeast infection and the pill she was given only lasted a couple of hrs. She has been itching so bad since that she is bleeding and needs something to stop the itching. Please call.[phone] (H)

## 2018-12-20 NOTE — Telephone Encounter (Signed)
Called patient back. Patient has had a yeast infection for weeks and does not remember who she got the pill from or what the name of the pill was she thought we gave it to her. states it only worked for a couple hours is using OTC anti itch cream and it is not helping. No odor or discharge just severe itching and states "would like to cut it off." she has tried not to scratch but it has gotten to the point that she cannot help it and has scratched so much that she is bleeding. Patient has no means to virtual video visit and would like to know what she can do or if she can get something sent in to help?

## 2019-01-09 DIAGNOSIS — J449 Chronic obstructive pulmonary disease, unspecified: Secondary | ICD-10-CM | POA: Diagnosis not present

## 2019-01-31 DIAGNOSIS — I5041 Acute combined systolic (congestive) and diastolic (congestive) heart failure: Secondary | ICD-10-CM | POA: Diagnosis not present

## 2019-01-31 DIAGNOSIS — J441 Chronic obstructive pulmonary disease with (acute) exacerbation: Secondary | ICD-10-CM | POA: Diagnosis not present

## 2019-02-02 ENCOUNTER — Telehealth: Payer: Self-pay | Admitting: Internal Medicine

## 2019-02-02 NOTE — Telephone Encounter (Signed)
Routing to dr crawford---do you want patient to come into lab for urinalysis?---or what recommendations do you have---please advise, I will call patient back,thanks

## 2019-02-02 NOTE — Telephone Encounter (Signed)
Pt is a 8 on risk scale. Patient cannot do virtual, please advise

## 2019-02-02 NOTE — Telephone Encounter (Signed)
Copied from [place] [phone]. Topic: Quick Communication - Rx Refill/Question >> Feb 02, 2019  2:01 PM Alanda Slim E wrote: Medication: Pt would like a medication called in for a yeast infection/ please advise    Preferred Pharmacy (with phone number or street name): [id number] [address], [place]. [phone] (Phone) [phone] (Fax)    Agent: Please be advised that RX refills may take up to 3 business days. We ask that you follow-up with your pharmacy.

## 2019-02-03 ENCOUNTER — Ambulatory Visit: Payer: Medicare Other | Admitting: Internal Medicine

## 2019-02-03 NOTE — Telephone Encounter (Signed)
[place]/sched to call pateint to schedule appt time

## 2019-02-03 NOTE — Progress Notes (Signed)
Started in error

## 2019-02-03 NOTE — Telephone Encounter (Signed)
It would appear this is ongoing and several rounds of diflucan. Needs phone call if cannot do virtual to assess.

## 2019-02-09 DIAGNOSIS — J449 Chronic obstructive pulmonary disease, unspecified: Secondary | ICD-10-CM | POA: Diagnosis not present

## 2019-02-10 ENCOUNTER — Other Ambulatory Visit: Payer: Self-pay | Admitting: *Deleted

## 2019-02-10 NOTE — Patient Outreach (Signed)
Referral received from [employer] high cost claimant list, outreach call to pt at [phone] with no answer to telephone and no option to leave voicemail,  RN CM mailed unsuccessful outreach letter to pt home.  PLAN Outreach pt in 3-4 business days  Jacqlyn Larsen [facility], [place] [phone]

## 2019-02-11 ENCOUNTER — Other Ambulatory Visit: Payer: Self-pay | Admitting: Internal Medicine

## 2019-02-15 ENCOUNTER — Other Ambulatory Visit: Payer: Self-pay

## 2019-02-15 NOTE — Patient Outreach (Signed)
[place] [facility]) Care Management  02/15/2019  [name] Case 1931-12-25 [id number]   Attempted outreach in the absence of assigned RNCM. Call answered by another member in the home. Member reports being unable to locate [name] at the time of my call. Will attempt outreach later this week.  PLAN -Will attempt outreach on 02/18/19.  [place] [phone]

## 2019-02-18 ENCOUNTER — Other Ambulatory Visit: Payer: Self-pay

## 2019-02-18 NOTE — Patient Outreach (Signed)
[place] [facility]) Care Management  02/18/2019  [name] Case Jan 06, 1932 [id number]    Attempted follow-up outreach in absence of assigned RNCM.  Attempted to contact Ms. Kary earlier this week. Call was answered by a member in the home who indicated that they could not locate her at the time of the call. Attempted outreach again today. No answer. Will update assigned RNCM.  [place] [phone]

## 2019-02-23 ENCOUNTER — Other Ambulatory Visit: Payer: Self-pay | Admitting: *Deleted

## 2019-02-23 NOTE — Patient Outreach (Signed)
Case closed, unable to reach pt.  RN CM faxed case closure letter to primary MD.  Case closed  Jacqlyn Larsen [facility], [place] Coordinator [phone]

## 2019-02-24 NOTE — Progress Notes (Signed)
Subjective:   [name] Case is a 85 y.o. female who presents for an Initial Medicare Annual Wellness Visit. I connected with patient by a telephone and verified that I am speaking with the correct person using two identifiers. Patient stated full name and DOB. Patient gave permission to continue with telephonic visit. Patient's location was at home and Nurse's location was at [place] office.   Review of Systems     Cardiac Risk Factors include: advanced age (>37men, >20 women);diabetes mellitus;obesity (BMI >30kg/m2) Sleep patterns: does not get up to void, gets up 1-2 times nightly to void and sleeps 7-8 hours nightly.    Home Safety/Smoke Alarms: Feels safe in home. Smoke alarms in place.  Living environment; residence and Firearm Safety: 1-story house/ trailer, equipment: Radio producer, Type: [place] and Walkers, Type: Conservation officer, nature. Lives with grand-daughter and her family, no needs for DME, good support system Seat Belt Safety/Bike Helmet: Wears seat belt.     Objective:    There were no vitals filed for this visit. There is no height or weight on file to calculate BMI.  Advanced Directives 02/25/2019 10/28/2018 10/21/2018 09/19/2018 08/25/2018 08/22/2018 08/03/2018  Does Patient Have a Medical Advance Directive? Yes Yes Yes Yes Yes Yes -  Type of Paramedic of [place];Living will [place];Living will Living will Healthcare Power of [place];Living will - Living will  Does patient want to make changes to medical advance directive? - - No - Patient declined No - Patient declined No - Patient declined - No - Patient declined  Copy of [place] in Chart? No - copy requested - - No - copy requested No - copy requested - -  Would patient like information on creating a medical advance directive? - - No - Patient declined No - Patient declined No - Patient declined No - Patient declined -     Current Medications (verified) Outpatient Encounter Medications as of 02/25/2019  Medication Sig  . acetaminophen (TYLENOL) 500 MG tablet Take 500-1,000 mg by mouth every 8 (eight) hours as needed for moderate pain or headache.   [name] atorvastatin (LIPITOR) 20 MG tablet TAKE 1 TABLET BY MOUTH DAILY AT 6 PM  . diltiazem (CARDIZEM CD) 180 MG 24 hr capsule Take 1 capsule (180 mg total) by mouth daily.  [name] ELIQUIS 5 MG TABS tablet TAKE 1 TABLET BY MOUTH TWICE A DAY  . furosemide (LASIX) 40 MG tablet Take 2 tablets (80 mg total) by mouth daily.  [name] ipratropium (ATROVENT) 0.02 % nebulizer solution Take 5 mLs (1 mg total) by nebulization every 6 (six) hours as needed for wheezing or shortness of breath.  . irbesartan (AVAPRO) 150 MG tablet TAKE 1/2 TABLET BY MOUTH DAILY  . levalbuterol (XOPENEX) 0.63 MG/3ML nebulizer solution Take 3 mLs (0.63 mg total) by nebulization every 6 (six) hours as needed for wheezing or shortness of breath.  . levothyroxine (SYNTHROID, LEVOTHROID) 100 MCG tablet Take 1 tablet (100 mcg total) by mouth daily. (Patient taking differently: Take 100 mcg by mouth daily before breakfast. )  . metoprolol succinate (TOPROL-XL) 50 MG 24 hr tablet TAKE 1 TABLET BY MOUTH DAILY WITH OR IMMEDIATELY FOLLOWING A MEAL.  [name] potassium chloride SA (K-DUR,KLOR-CON) 20 MEQ tablet Take 1 tablet (20 mEq total) by mouth daily.  [name] trimethoprim-polymyxin b (POLYTRIM) ophthalmic solution Place 1 drop into the right eye every 6 (six) hours.  . [DISCONTINUED] fluconazole (DIFLUCAN) 150 MG tablet Take  1 tablet (150 mg total) by mouth every 3 (three) days. (Patient not taking: Reported on 02/25/2019)  . [DISCONTINUED] triamcinolone cream (KENALOG) 0.1 % Apply 1 application topically 2 (two) times daily. (Patient not taking: Reported on 02/25/2019)   No facility-administered encounter medications on file as of 02/25/2019.     Allergies (verified) Codeine   History: Past Medical History:  Diagnosis Date  .  Allergy   . Arthritis   . Atrial fibrillation ([place])   . Bronchitis   . Cancer ([place])    Bilateral upper tract transitional cell carcinoma  . Cardiomegaly   . CHF (congestive heart failure) ([place])   . Complication of anesthesia    Difficult to arouse  . COPD (chronic obstructive pulmonary disease) ([facility])    borderline   pt. denies at preop  . Diabetes mellitus without complication ([place])    Controlled by diet and exercise  . Diverticulosis of sigmoid colon   . Dyspnea    at times  . Dysrhythmia    a fib  . Family history of bladder cancer   . Family history of colon cancer   . Family history of kidney cancer   . Fatty liver   . GERD (gastroesophageal reflux disease)   . History of hiatal hernia 01/20/2015   Small sliding, noted on CT  . History of kidney stones   . Hyperlipidemia   . Hypertension   . Hypothyroidism   . Lynch syndrome   . PONV (postoperative nausea and vomiting)   . Supraumbilical hernia 47/82/9562   Small noted on CT  . Thyroid disease   . UTI (urinary tract infection)    Past Surgical History:  Procedure Laterality Date  . ABDOMINAL HYSTERECTOMY    . APPENDECTOMY    . CHOLECYSTECTOMY    . COLONOSCOPY  07/16/2009  . CYSTOSCOPY W/ URETERAL STENT PLACEMENT Right 12/04/2017   Procedure: CYSTOSCOPY WITH RETROGRADE PYELOGRAM/URETERAL STENT PLACEMENT;  Surgeon: Festus Aloe, MD;  Location: [facility];  Service: Urology;  Laterality: Right;  . CYSTOSCOPY W/ URETERAL STENT PLACEMENT Right 02/26/2018   Procedure: CYSTOSCOPY WITH RETROGRADE PYELOGRAM/URETERAL STENT PLACEMENT;  Surgeon: Kathie Rhodes, MD;  Location: [facility];  Service: Urology;  Laterality: Right;  . CYSTOSCOPY WITH URETEROSCOPY AND STENT PLACEMENT Bilateral 01/29/2018   Procedure: BILATERAL URETEROSCOPY AND TUMOR RESECTION  WITH LASER RIGHT STENT EXCHANGE LEFT STENT PLACEMENT;  Surgeon: Ardis Hughs, MD;  Location: [facility];  Service: Urology;  Laterality: Bilateral;  . CYSTOSCOPY WITH URETEROSCOPY  AND STENT PLACEMENT Bilateral 02/10/2018   Procedure: BILATERAL URETEROSCOPY AND TUMOR EXCISION BILATERAL STENT EXCHANGE;  Surgeon: Ardis Hughs, MD;  Location: [facility];  Service: Urology;  Laterality: Bilateral;  . CYSTOSCOPY/RETROGRADE/URETEROSCOPY Bilateral 12/24/2017   Procedure: BILATERAL URETEROSCOPY, RIGHT STENT EXCHANGE, STONE EXTRACTION WITH BASKET BILATERAL RETROGRADE PYELOGRAM, BIOPSY OF RIGHT UPPER POLE TUMOR,  BILATERAL RENAL PELVIS FLUID SENT FOR CYTOLOGY;  Surgeon: Ardis Hughs, MD;  Location: [facility];  Service: Urology;  Laterality: Bilateral;  . CYSTOSCOPY/URETEROSCOPY/HOLMIUM LASER/STENT PLACEMENT Bilateral 06/03/2018   Procedure: CYSTOSCOPY/BILATERAL URETEROSCOPY/ LASER TUMOR ABLATION BILATERAL / BILATERAL STENT PLACEMENT/BILATERAL RETROGRADE PYELOGRAM/BLADDER BIOPSY WITH FULGURATION;  Surgeon: Ardis Hughs, MD;  Location: [facility];  Service: Urology;  Laterality: Bilateral;  . HOLMIUM LASER APPLICATION Bilateral 13/0/8657   Procedure: HOLMIUM LASER APPLICATION;  Surgeon: Ardis Hughs, MD;  Location: [facility];  Service: Urology;  Laterality: Bilateral;  . KNEE SURGERY Right   . KNEE SURGERY Left    torn ligament repair  . UPPER GI  ENDOSCOPY    . ureteroscopy laser tumor ablation bilateral stent placement     Dr. Louis Meckel 06-03-18    Family History  Problem Relation Age of Onset  . Heart disease Mother   . Colon cancer Mother 46       d. 28  . Tuberculosis Father   . Bladder Cancer Brother        urothelial cancer  . Bladder Cancer Brother        ureter cancer  . Kidney cancer Brother   . Skin cancer Sister   . Cancer Maternal Aunt        NOS  . Colon cancer Other        dx 1st time under 59, second time at 61  . Lung cancer Other   . Muscular dystrophy Other        d. 57  . Testicular cancer Other 24       great nephew  . Alcohol abuse Daughter   . Drug abuse Daughter   . Schizophrenia Daughter   . Colon cancer Grandchild    Social History    Socioeconomic History  . Marital status: Widowed    Spouse name: Not on file  . Number of children: Not on file  . Years of education: Not on file  . Highest education level: Not on file  Occupational History  . Occupation: retired  Scientific laboratory technician  . Financial resource strain: Not hard at all  . Food insecurity    Worry: Never true    Inability: Never true  . Transportation needs    Medical: No    Non-medical: No  Tobacco Use  . Smoking status: Never Smoker  . Smokeless tobacco: Never Used  Substance and Sexual Activity  . Alcohol use: No    Alcohol/week: 0.0 standard drinks  . Drug use: No  . Sexual activity: Not Currently    Birth control/protection: Surgical  Lifestyle  . Physical activity    Days per week: 0 days    Minutes per session: 0 min  . Stress: Not at all  Relationships  . Social connections    Talks on phone: More than three times a week    Gets together: More than three times a week    Attends religious service: 1 to 4 times per year    Active member of club or organization: Not on file    Attends meetings of clubs or organizations: Not on file    Relationship status: Not on file  Other Topics Concern  . Not on file  Social History Narrative  . Not on file    Tobacco Counseling Counseling given: Not Answered  Activities of Daily Living In your present state of health, do you have any difficulty performing the following activities: 02/25/2019 08/03/2018  Hearing? N N  Vision? N N  Difficulty concentrating or making decisions? N N  Walking or climbing stairs? Y Y  Dressing or bathing? Y N  Doing errands, shopping? Tempie Donning  Preparing Food and eating ? Y -  Using the Toilet? N -  In the past six months, have you accidently leaked urine? N -  Do you have problems with loss of bowel control? N -  Managing your Medications? N -  Managing your Finances? N -  Housekeeping or managing your Housekeeping? Y -  Some recent data might be hidden      Immunizations and Health Maintenance Immunization History  Administered Date(s) Administered  . Influenza Split  06/23/2011  . Influenza Whole 07/19/2007  . Influenza, High Dose Seasonal PF 07/31/2018  . Influenza,inj,Quad PF,6+ Mos 06/08/2015, 07/28/2016  . Pneumococcal Conjugate-13 03/08/2015  . Tdap 06/28/2014, 08/22/2018   Health Maintenance Due  Topic Date Due  . OPHTHALMOLOGY EXAM  10/17/1941  . PNA vac Low Risk Adult (2 of 2 - PPSV23) 03/07/2016    Patient Care Team: Hoyt Koch, MD as PCP - General (Internal Medicine)  Indicate any recent Medical Services you may have received from other than [facility] providers in the past year (date may be approximate).     Assessment:   This is a routine wellness examination for [place]. Physical assessment deferred to PCP.  Hearing/Vision screen  Hearing Screening   125Hz  250Hz  500Hz  1000Hz  2000Hz  3000Hz  4000Hz  6000Hz  8000Hz   Right ear:           Left ear:           Comments: Able to hear conversational tones w/o difficulty. No issues reported.    Vision Screening Comments: Patient does not go the ophthalmologist annually. Education provided regarding importance of annual eye exams and eye health.      Dietary issues and exercise activities discussed: Current Exercise Habits: The patient does not participate in regular exercise at present, Exercise limited by: orthopedic condition(s)  Diet (meal preparation, eat out, water intake, caffeinated beverages, dairy products, fruits and vegetables): in general, a "healthy" diet  , well balanced. Reports good appetite. eats a variety of fruits and vegetables daily, limits salt, fat/cholesterol, sugar,carbohydrates,caffeine, drinks 6-8 glasses of water daily.  Goals    . Patient Stated     Maintain current health status and enjoy spending time with my family.      Depression Screen PHQ 2/9 Scores 02/25/2019 04/23/2018 03/15/2018 02/29/2016  PHQ - 2 Score 0 3 0 0  PHQ- 9 Score 0  18 - -    Fall Risk Fall Risk  02/25/2019 03/15/2018 02/29/2016  Falls in the past year? 0 Yes Yes  Number falls in past yr: 0 1 2 or more  Injury with Fall? - No Yes  Risk for fall due to : Impaired mobility;Impaired balance/gait - -  Risk for fall due to: Comment uses cane or walker - -   Cognitive Function:       Ad8 score reviewed for issues:  Issues making decisions: no  Less interest in hobbies / activities: no  Repeats questions, stories (family complaining): no  Trouble using ordinary gadgets (microwave, computer, phone):no  Forgets the month or year: no  Mismanaging finances: no  Remembering appts: no  Daily problems with thinking and/or memory: no Ad8 score is= 0  Screening Tests Health Maintenance  Topic Date Due  . OPHTHALMOLOGY EXAM  10/17/1941  . PNA vac Low Risk Adult (2 of 2 - PPSV23) 03/07/2016  . FOOT EXAM  03/16/2019  . INFLUENZA VACCINE  04/02/2019  . TETANUS/TDAP  08/22/2028  . DEXA SCAN  Completed     Plan:    Reviewed health maintenance screenings with patient today and relevant education, vaccines, and/or referrals were provided.   Continue to eat heart healthy diet (full of fruits, vegetables, whole grains, lean protein, water--limit salt, fat, and sugar intake) and increase physical activity as tolerated.  I have personally reviewed and noted the following in the patient's chart:   . Medical and social history . Use of alcohol, tobacco or illicit drugs  . Current medications and supplements . Functional ability and status . Nutritional  status . Physical activity . Advanced directives . List of other physicians . Hospitalizations, surgeries, and ER visits in previous 12 months . Vitals . Screenings to include cognitive, depression, and falls . Referrals and appointments  In addition, I have reviewed and discussed with patient certain preventive protocols, quality metrics, and best practice recommendations. A written personalized  care plan for preventive services as well as general preventive health recommendations were provided to patient.     Michiel Cowboy, RN   02/25/2019

## 2019-02-25 ENCOUNTER — Ambulatory Visit (INDEPENDENT_AMBULATORY_CARE_PROVIDER_SITE_OTHER): Payer: Medicare Other | Admitting: *Deleted

## 2019-02-25 DIAGNOSIS — Z0000 Encounter for general adult medical examination without abnormal findings: Secondary | ICD-10-CM

## 2019-02-25 NOTE — Progress Notes (Signed)
Medical screening examination/treatment/procedure(s) were performed by non-physician practitioner and as supervising physician I was immediately available for consultation/collaboration. I agree with above. Elizabeth A Crawford, MD 

## 2019-03-02 DIAGNOSIS — I5041 Acute combined systolic (congestive) and diastolic (congestive) heart failure: Secondary | ICD-10-CM | POA: Diagnosis not present

## 2019-03-02 DIAGNOSIS — J441 Chronic obstructive pulmonary disease with (acute) exacerbation: Secondary | ICD-10-CM | POA: Diagnosis not present

## 2019-03-07 ENCOUNTER — Other Ambulatory Visit: Payer: Self-pay | Admitting: Internal Medicine

## 2019-03-08 ENCOUNTER — Encounter: Payer: Self-pay | Admitting: Internal Medicine

## 2019-03-08 ENCOUNTER — Ambulatory Visit (INDEPENDENT_AMBULATORY_CARE_PROVIDER_SITE_OTHER): Payer: Medicare Other | Admitting: Internal Medicine

## 2019-03-08 DIAGNOSIS — B373 Candidiasis of vulva and vagina: Secondary | ICD-10-CM

## 2019-03-08 DIAGNOSIS — B3731 Acute candidiasis of vulva and vagina: Secondary | ICD-10-CM | POA: Insufficient documentation

## 2019-03-08 MED ORDER — FLUCONAZOLE 150 MG PO TABS: 150.0000 mg | ORAL_TABLET | ORAL | 1 refills | Status: DC

## 2019-03-08 NOTE — Progress Notes (Signed)
Virtual Visit via Audio Note  I connected with [name] Case on 03/08/19 at  8:20 AM EDT by an audio-only enabled telemedicine application and verified that I am speaking with the correct person using two identifiers.  The patient and the provider were at separate locations throughout the entire encounter.   I discussed the limitations of evaluation and management by telemedicine and the availability of in person appointments. The patient expressed understanding and agreed to proceed.  History of Present Illness: The patient is a 85 y.o. female with visit for concerns about possible yeast infection. She has called in multiple times over the past 3 months with concerns about itching and pain vaginally and inguinally. She has gotten several courses of diflucan which helped dramatically including the most recent was about 2 months ago. She states she got relief within a few hours of taking the diflucan pill. Started 1-2 days ago this time. Has no urinary symptoms such as pain or burning with urination. Denies fevers or chills. Breathing is stable. Overall it is worsening with the itching. Has tried nothing. She was diagnosed clinically with lichen planus and given steroid creams back in 2017. She did not follow up with recommendation to see gyn and has not seen gyn in many years. She does not feel that this is the same.   Observations/Objective: Voice strong, A and O times 3, mild dyspnea with walking  Assessment and Plan: See problem oriented charting  Follow Up Instructions: rx diflucan times 3 to use for symptoms  Visit time 11 minutes: that time was spent in face to face counseling and coordination of care with the patient: counseled about as above  I discussed the assessment and treatment plan with the patient. The patient was provided an opportunity to ask questions and all were answered. The patient agreed with the plan and demonstrated an understanding of the instructions.   The patient  was advised to call back or seek an in-person evaluation if the symptoms worsen or if the condition fails to improve as anticipated.  Hoyt Koch, MD

## 2019-03-08 NOTE — Assessment & Plan Note (Signed)
Had good relief to diflucan previously and has not had any symptoms of this for about 2 months. Will rx diflucan to use as needed. Avoid itching and can use vaseline if skin breakdown to help with irritation.

## 2019-03-11 DIAGNOSIS — J449 Chronic obstructive pulmonary disease, unspecified: Secondary | ICD-10-CM | POA: Diagnosis not present

## 2019-03-18 ENCOUNTER — Other Ambulatory Visit: Payer: Self-pay | Admitting: Internal Medicine

## 2019-03-25 ENCOUNTER — Telehealth: Payer: Self-pay | Admitting: Internal Medicine

## 2019-03-25 NOTE — Telephone Encounter (Signed)
She just had diflucan earlier this month.  I know this may help temporarily, but if she is having frequent symptoms this is most likely linchen planus or sclerosis,which should be treated with steroids

## 2019-03-25 NOTE — Telephone Encounter (Signed)
Notified pt w/MD response. Pt does not want to come in for appt, but would do an virtual. Made appt for 03/29/19 w/Dr.crawford.Marland KitchenJohny Chess

## 2019-03-25 NOTE — Telephone Encounter (Signed)
MD is out of the office pls advise refill.Marland KitchenJohny Case

## 2019-03-25 NOTE — Telephone Encounter (Signed)
Medication Refill - Medication:  fluconazole (DIFLUCAN) 150 MG tablet  Has the patient contacted their pharmacy? Yes (Agent: If no, request that the patient contact the pharmacy for the refill.) (Agent: If yes, when and what did the pharmacy advise?)Contact PCP  Preferred Pharmacy (with phone number or street name):  [id number] - [place], [place]. [phone] (Phone) [phone] (Fax)     Agent: Please be advised that RX refills may take up to 3 business days. We ask that you follow-up with your pharmacy.

## 2019-03-29 ENCOUNTER — Ambulatory Visit: Payer: Medicare Other | Admitting: Internal Medicine

## 2019-03-29 NOTE — Progress Notes (Signed)
Entered in error, patient did not want visit.

## 2019-03-31 ENCOUNTER — Telehealth: Payer: Self-pay | Admitting: Internal Medicine

## 2019-03-31 ENCOUNTER — Encounter: Payer: Self-pay | Admitting: Internal Medicine

## 2019-03-31 ENCOUNTER — Other Ambulatory Visit: Payer: Self-pay

## 2019-03-31 ENCOUNTER — Ambulatory Visit (INDEPENDENT_AMBULATORY_CARE_PROVIDER_SITE_OTHER): Payer: Medicare Other | Admitting: Internal Medicine

## 2019-03-31 DIAGNOSIS — L439 Lichen planus, unspecified: Secondary | ICD-10-CM

## 2019-03-31 NOTE — Telephone Encounter (Signed)
In previous note Dr. Quay Burow said patient may need steroids, would like patient to make appt

## 2019-03-31 NOTE — Progress Notes (Signed)
Virtual Visit via Audio Note  I connected with [name] Case on 03/31/19 at  2:40 PM EDT by an audio-only enabled telemedicine application and verified that I am speaking with the correct person using two identifiers.  The patient and the provider were at separate locations throughout the entire encounter.   I discussed the limitations of evaluation and management by telemedicine and the availability of in person appointments. The patient expressed understanding and agreed to proceed.  History of Present Illness: The patient is a 85 y.o. female with visit for ongoing problems with vaginal irritation. Started months ago per her reports. In the past she has been diagnosed with lichen planus and has not had follow up with gyn as advised as she was feeling better. In the last several months a lot of irritation and itching. She is trying not to scratch. She denies current skin breakdown but some pain with rubbing. No pain with urination or frequency or urgency of urination. About 1 month ago she was given about 1 weeks worth of diflucan but she took 1 pill daily for 3 days instead of every 3 days. She feels that this helped but there is still some itch underneath. She is not having any discharge. Denies fevers or chills. Overall it is worsening in the last few days. Has tried vaseline and otc creams with little relief.   Observations/Objective: Voice strong, no coughing or dyspnea during visit, A and O times 3  Assessment and Plan: See problem oriented charting  Follow Up Instructions: rx diflucan and triamcinolone ointment, to use the ointment regularly  Visit time 11 minutes: that time was spent in face to face counseling and coordination of care with the patient: counseled about as above  I discussed the assessment and treatment plan with the patient. The patient was provided an opportunity to ask questions and all were answered. The patient agreed with the plan and demonstrated an understanding  of the instructions.   The patient was advised to call back or seek an in-person evaluation if the symptoms worsen or if the condition fails to improve as anticipated.  Hoyt Koch, MD

## 2019-03-31 NOTE — Telephone Encounter (Signed)
Phone visit scheduled

## 2019-03-31 NOTE — Telephone Encounter (Signed)
She should have phone visit. She was supposed to have one Tuesday but canceled as she stated she was feeling better.

## 2019-03-31 NOTE — Telephone Encounter (Signed)
Patient would like something called in for a vaginal yeast infection and sent to her preferred pharmacy [facility] [address].

## 2019-04-01 MED ORDER — TRIAMCINOLONE ACETONIDE 0.1 % EX CREA: 1.0000 "application " | TOPICAL_CREAM | Freq: Two times a day (BID) | CUTANEOUS | 0 refills | Status: DC

## 2019-04-01 NOTE — Assessment & Plan Note (Signed)
Likely this is the true cause of her vaginal and perineal irritation. Rx diflucan to take and triamcinolone ointment to use and explained to her about the often long term nature of this and the importance of not itching.

## 2019-04-01 NOTE — Patient Instructions (Signed)
Lichen Planus Lichen planus is a skin problem that causes redness, itching, small bumps, and sores. It can affect the skin in any area of the body. Some common areas affected include:  Arms, wrists, legs, or ankles.  Chest, back, or abdomen.  Genital areas such as the vulva and vagina.  Gums and inside of the mouth.  Scalp.  Fingernails or toenails. Treatment can help control the symptoms of this condition. The condition can last for a long time. It can take 6-18 months or longer for it to go away. What are the causes? The exact cause of this condition is not known. The condition is not passed from one person to another (not contagious). It may be related to an allergy, a medicine, or an autoimmune response. An autoimmune response occurs when the body's defense system (immune system) mistakenly attacks healthy tissues. What increases the risk? The following factors may make you more likely to develop this condition:  Being older than 83 years of age.  Taking certain medicines.  Having been exposed to certain dyes or chemicals.  Having hepatitis C.  Being a woman. What are the signs or symptoms? Symptoms of this condition may include:  Itching, which can be severe.  Small reddish or purple bumps on the skin. These may have flat tops and may be round or irregular shaped.  Redness or white patches on the gums or tongue.  Redness, soreness, or a burning feeling in the genital area. This may lead to pain or bleeding during sex.  Changes in the fingernails or toenails. The nails may become thin or rough. They may have ridges in them.  Redness or irritation of the eyes. This is rare. How is this diagnosed? This condition may be diagnosed based on:  A physical exam. The health care provider will examine your affected skin and check for changes inside your mouth.  Removal of a tissue sample (biopsy sample) to be looked at under a microscope. How is this treated? Treatment for  this condition may depend on the severity of symptoms. In some cases, no treatment is needed. If treatment is needed to control symptoms, it may include:  Creams or ointments (topical steroids) to help control itching and irritation.  Medicine to be taken by mouth.  Medicine to be taken by injection.  A treatment in which your skin is exposed to ultraviolet light (phototherapy).  Lozenges that you suck on to help treat sores in the mouth. Follow these instructions at home:   Take or use over-the-counter and prescription medicines only as told by your health care provider.  Use creams or ointments as told by your health care provider.  Do not scratch the affected areas of skin.  If you are a woman, be sure to keep the vaginal area as clean and dry as possible.  If you have sores in your mouth, avoid spicy and acidic foods as well as alcohol and tobacco.  Keep all follow-up visits as told by your health care provider. This is important. Contact a health care provider if:  You have increasing redness, swelling, or pain in the affected area.  You have fluid, blood, or pus coming from the affected area.  Your eyes become irritated. Summary  Lichen planus is a skin problem that causes redness, itching, small bumps, and sores. It can affect the skin in any area of the body.  Do not scratch the affected areas of skin. Keep the affected area of skin clean.  Take or use  over-the-counter and prescription medicines only as told by your health care provider.  Contact a health care provider if you have drainage from the affected area or have increasing redness, swelling, or pain in the area.  Keep all follow-up visits as told by your health care provider. This is important. This information is not intended to replace advice given to you by your health care provider. Make sure you discuss any questions you have with your health care provider. Document Released: 01/09/2011 Document  Revised: 12/30/2017 Document Reviewed: 12/30/2017 Elsevier Patient Education  2020 [employer].

## 2019-04-02 DIAGNOSIS — I5041 Acute combined systolic (congestive) and diastolic (congestive) heart failure: Secondary | ICD-10-CM | POA: Diagnosis not present

## 2019-04-02 DIAGNOSIS — J441 Chronic obstructive pulmonary disease with (acute) exacerbation: Secondary | ICD-10-CM | POA: Diagnosis not present

## 2019-04-11 DIAGNOSIS — J449 Chronic obstructive pulmonary disease, unspecified: Secondary | ICD-10-CM | POA: Diagnosis not present

## 2019-04-20 ENCOUNTER — Other Ambulatory Visit: Payer: Self-pay

## 2019-04-20 ENCOUNTER — Encounter (HOSPITAL_COMMUNITY): Payer: Self-pay | Admitting: Emergency Medicine

## 2019-04-20 ENCOUNTER — Emergency Department (HOSPITAL_COMMUNITY)
Admission: EM | Admit: 2019-04-20 | Discharge: 2019-04-20 | Disposition: A | Discharge status: Home / Self Care | Payer: Medicare Other | Source: Home / Self Care | Attending: Emergency Medicine | Admitting: Emergency Medicine

## 2019-04-20 ENCOUNTER — Emergency Department (HOSPITAL_COMMUNITY)
Admission: EM | Admit: 2019-04-20 | Discharge: 2019-04-20 | Disposition: A | Discharge status: Home / Self Care | Payer: Medicare Other | Attending: Emergency Medicine | Admitting: Emergency Medicine

## 2019-04-20 DIAGNOSIS — R112 Nausea with vomiting, unspecified: Secondary | ICD-10-CM

## 2019-04-20 DIAGNOSIS — R1084 Generalized abdominal pain: Secondary | ICD-10-CM | POA: Diagnosis not present

## 2019-04-20 DIAGNOSIS — I5030 Unspecified diastolic (congestive) heart failure: Secondary | ICD-10-CM | POA: Insufficient documentation

## 2019-04-20 DIAGNOSIS — Z20828 Contact with and (suspected) exposure to other viral communicable diseases: Secondary | ICD-10-CM | POA: Insufficient documentation

## 2019-04-20 DIAGNOSIS — R1111 Vomiting without nausea: Secondary | ICD-10-CM | POA: Diagnosis not present

## 2019-04-20 DIAGNOSIS — R5381 Other malaise: Secondary | ICD-10-CM | POA: Diagnosis not present

## 2019-04-20 DIAGNOSIS — Z960 Presence of urogenital implants: Secondary | ICD-10-CM | POA: Diagnosis not present

## 2019-04-20 DIAGNOSIS — Z5320 Procedure and treatment not carried out because of patient's decision for unspecified reasons: Secondary | ICD-10-CM | POA: Insufficient documentation

## 2019-04-20 DIAGNOSIS — Z79899 Other long term (current) drug therapy: Secondary | ICD-10-CM | POA: Insufficient documentation

## 2019-04-20 DIAGNOSIS — J449 Chronic obstructive pulmonary disease, unspecified: Secondary | ICD-10-CM | POA: Insufficient documentation

## 2019-04-20 DIAGNOSIS — I110 Hypertensive heart disease with heart failure: Secondary | ICD-10-CM | POA: Insufficient documentation

## 2019-04-20 DIAGNOSIS — I5033 Acute on chronic diastolic (congestive) heart failure: Secondary | ICD-10-CM | POA: Insufficient documentation

## 2019-04-20 DIAGNOSIS — E039 Hypothyroidism, unspecified: Secondary | ICD-10-CM | POA: Diagnosis not present

## 2019-04-20 DIAGNOSIS — R110 Nausea: Secondary | ICD-10-CM | POA: Diagnosis not present

## 2019-04-20 DIAGNOSIS — E860 Dehydration: Secondary | ICD-10-CM | POA: Insufficient documentation

## 2019-04-20 DIAGNOSIS — E119 Type 2 diabetes mellitus without complications: Secondary | ICD-10-CM | POA: Insufficient documentation

## 2019-04-20 DIAGNOSIS — I4891 Unspecified atrial fibrillation: Secondary | ICD-10-CM | POA: Insufficient documentation

## 2019-04-20 DIAGNOSIS — R197 Diarrhea, unspecified: Secondary | ICD-10-CM | POA: Insufficient documentation

## 2019-04-20 DIAGNOSIS — I959 Hypotension, unspecified: Secondary | ICD-10-CM | POA: Diagnosis not present

## 2019-04-20 DIAGNOSIS — Z03818 Encounter for observation for suspected exposure to other biological agents ruled out: Secondary | ICD-10-CM | POA: Diagnosis not present

## 2019-04-20 LAB — COMPREHENSIVE METABOLIC PANEL
ALT: 19 U/L (ref 0–44)
ALT: 17 U/L (ref 0–44)
AST: 22 U/L (ref 15–41)
AST: 16 U/L (ref 15–41)
Albumin: 3.8 g/dL (ref 3.5–5.0)
Albumin: 3.1 g/dL — ABNORMAL LOW (ref 3.5–5.0)
Alkaline Phosphatase: 84 U/L (ref 38–126)
Alkaline Phosphatase: 72 U/L (ref 38–126)
Anion gap: 13 (ref 5–15)
Anion gap: 9 (ref 5–15)
BUN: 23 mg/dL (ref 8–23)
BUN: 16 mg/dL (ref 8–23)
CO2: 30 mmol/L (ref 22–32)
CO2: 30 mmol/L (ref 22–32)
Calcium: 9.7 mg/dL (ref 8.9–10.3)
Calcium: 9.1 mg/dL (ref 8.9–10.3)
Chloride: 96 mmol/L — ABNORMAL LOW (ref 98–111)
Chloride: 102 mmol/L (ref 98–111)
Creatinine, Ser: 1.1 mg/dL — ABNORMAL HIGH (ref 0.44–1.00)
Creatinine, Ser: 0.98 mg/dL (ref 0.44–1.00)
GFR calc Af Amer: 53 mL/min — ABNORMAL LOW (ref >60)
GFR calc Af Amer: >60 mL/min (ref >60)
GFR calc non Af Amer: 46 mL/min — ABNORMAL LOW (ref >60)
GFR calc non Af Amer: 53 mL/min — ABNORMAL LOW (ref >60)
Glucose, Bld: 152 mg/dL — ABNORMAL HIGH (ref 70–99)
Glucose, Bld: 123 mg/dL — ABNORMAL HIGH (ref 70–99)
Potassium: 4.2 mmol/L (ref 3.5–5.1)
Potassium: 4.1 mmol/L (ref 3.5–5.1)
Sodium: 139 mmol/L (ref 135–145)
Sodium: 141 mmol/L (ref 135–145)
Total Bilirubin: 0.7 mg/dL (ref 0.3–1.2)
Total Bilirubin: 0.6 mg/dL (ref 0.3–1.2)
Total Protein: 7 g/dL (ref 6.5–8.1)
Total Protein: 6 g/dL — ABNORMAL LOW (ref 6.5–8.1)

## 2019-04-20 LAB — GASTROINTESTINAL PANEL BY PCR, STOOL (REPLACES STOOL CULTURE)

## 2019-04-20 LAB — CBC WITH DIFFERENTIAL/PLATELET
Abs Immature Granulocytes: 0.06 10*3/uL (ref 0.00–0.07)
Abs Immature Granulocytes: 0.03 10*3/uL (ref 0.00–0.07)
Basophils Absolute: 0.1 10*3/uL (ref 0.0–0.1)
Basophils Absolute: 0 10*3/uL (ref 0.0–0.1)
Basophils Relative: 1 %
Basophils Relative: 0 %
Eosinophils Absolute: 0.2 10*3/uL (ref 0.0–0.5)
Eosinophils Absolute: 0.1 10*3/uL (ref 0.0–0.5)
Eosinophils Relative: 1 %
Eosinophils Relative: 1 %
HCT: 39.4 % (ref 36.0–46.0)
HCT: 34.4 % — ABNORMAL LOW (ref 36.0–46.0)
Hemoglobin: 12.4 g/dL (ref 12.0–15.0)
Hemoglobin: 10.9 g/dL — ABNORMAL LOW (ref 12.0–15.0)
Immature Granulocytes: 0 %
Immature Granulocytes: 0 %
Lymphocytes Relative: 9 %
Lymphocytes Relative: 7 %
Lymphs Abs: 1.5 10*3/uL (ref 0.7–4.0)
Lymphs Abs: 0.7 10*3/uL (ref 0.7–4.0)
MCH: 32 pg (ref 26.0–34.0)
MCH: 32.1 pg (ref 26.0–34.0)
MCHC: 31.5 g/dL (ref 30.0–36.0)
MCHC: 31.7 g/dL (ref 30.0–36.0)
MCV: 101.5 fL — ABNORMAL HIGH (ref 80.0–100.0)
MCV: 101.2 fL — ABNORMAL HIGH (ref 80.0–100.0)
Monocytes Absolute: 1.1 10*3/uL — ABNORMAL HIGH (ref 0.1–1.0)
Monocytes Absolute: 0.7 10*3/uL (ref 0.1–1.0)
Monocytes Relative: 7 %
Monocytes Relative: 7 %
Neutro Abs: 13.5 10*3/uL — ABNORMAL HIGH (ref 1.7–7.7)
Neutro Abs: 9.5 10*3/uL — ABNORMAL HIGH (ref 1.7–7.7)
Neutrophils Relative %: 82 %
Neutrophils Relative %: 85 %
Platelets: 267 10*3/uL (ref 150–400)
Platelets: 223 10*3/uL (ref 150–400)
RBC: 3.88 MIL/uL (ref 3.87–5.11)
RBC: 3.4 MIL/uL — ABNORMAL LOW (ref 3.87–5.11)
RDW: 13 % (ref 11.5–15.5)
RDW: 13.1 % (ref 11.5–15.5)
WBC: 16.5 10*3/uL — ABNORMAL HIGH (ref 4.0–10.5)
WBC: 11.1 10*3/uL — ABNORMAL HIGH (ref 4.0–10.5)
nRBC: 0 % (ref 0.0–0.2)
nRBC: 0 % (ref 0.0–0.2)

## 2019-04-20 LAB — C DIFFICILE QUICK SCREEN W PCR REFLEX
C Diff antigen: NEGATIVE
C Diff interpretation: NOT DETECTED
C Diff toxin: NEGATIVE

## 2019-04-20 LAB — ETHANOL: Alcohol, Ethyl (B): <10 mg/dL (ref <10)

## 2019-04-20 LAB — LIPASE, BLOOD
Lipase: 24 U/L (ref 11–51)
Lipase: 21 U/L (ref 11–51)

## 2019-04-20 LAB — SARS CORONAVIRUS 2 BY RT PCR (HOSPITAL ORDER, PERFORMED IN ~~LOC~~ HOSPITAL LAB): SARS Coronavirus 2: NEGATIVE

## 2019-04-20 LAB — LACTIC ACID, PLASMA: Lactic Acid, Venous: 1.9 mmol/L (ref 0.5–1.9)

## 2019-04-20 MED ORDER — SODIUM CHLORIDE 0.9 % IV BOLUS (SEPSIS)
1000.0000 mL | Freq: Once | INTRAVENOUS | Status: AC
  Administered 2019-04-20: 1000 mL via INTRAVENOUS

## 2019-04-20 MED ORDER — ALUM & MAG HYDROXIDE-SIMETH 200-200-20 MG/5ML PO SUSP
30.0000 mL | Freq: Once | ORAL | Status: AC
  Administered 2019-04-20: 30 mL via ORAL
  Filled 2019-04-20: qty 30

## 2019-04-20 MED ORDER — LOPERAMIDE HCL 2 MG PO CAPS
4.0000 mg | ORAL_CAPSULE | Freq: Once | ORAL | Status: AC
  Administered 2019-04-20: 4 mg via ORAL
  Filled 2019-04-20: qty 2

## 2019-04-20 MED ORDER — PROMETHAZINE HCL 25 MG PO TABS: 25.0000 mg | ORAL_TABLET | Freq: Three times a day (TID) | ORAL | 0 refills | Status: DC | PRN

## 2019-04-20 MED ORDER — ONDANSETRON HCL 4 MG/2ML IJ SOLN
4.0000 mg | Freq: Once | INTRAMUSCULAR | Status: AC
  Administered 2019-04-20: 4 mg via INTRAVENOUS
  Filled 2019-04-20: qty 2

## 2019-04-20 MED ORDER — SODIUM CHLORIDE 0.9 % IV BOLUS
1000.0000 mL | Freq: Once | INTRAVENOUS | Status: AC
  Administered 2019-04-20: 1000 mL via INTRAVENOUS

## 2019-04-20 MED ORDER — PROCHLORPERAZINE EDISYLATE 10 MG/2ML IJ SOLN
10.0000 mg | Freq: Once | INTRAMUSCULAR | Status: AC
  Administered 2019-04-20: 10 mg via INTRAVENOUS
  Filled 2019-04-20: qty 2

## 2019-04-20 MED ORDER — ONDANSETRON HCL 4 MG PO TABS: 4.0000 mg | ORAL_TABLET | Freq: Three times a day (TID) | ORAL | 0 refills | Status: DC | PRN

## 2019-04-20 NOTE — ED Provider Notes (Signed)
[facility] Provider Note   CSN: [id number] Arrival date & time: 04/20/19  0209     History   Chief Complaint Chief Complaint  Patient presents with  . Abdominal Pain    HPI [name] Case is a 85 y.o. female.     The history is provided by the patient.  Abdominal Pain Pain location:  Generalized Pain quality: aching   Pain radiates to:  Does not radiate Pain severity:  Moderate Onset quality:  Sudden Timing:  Constant Progression:  Resolved Chronicity:  New Relieved by:  Nothing Worsened by:  Nothing Associated symptoms: nausea and vomiting   Associated symptoms: no chest pain, no constipation, no diarrhea, no dysuria, no hematemesis and no hematochezia   Patient with history of CHF, COPD, diabetes presents with abdominal pain.  She reports onset of abdominal pain yesterday.  Reports associated nausea vomiting.  She now reports abdominal pain is improving She reports she has been diagnosed with pancreatitis previously, but is unable to find further details She has a history of chronic A. fib is on Eliquis, she reports med compliant Past Medical History:  Diagnosis Date  . Allergy   . Arthritis   . Atrial fibrillation ([place])   . Bronchitis   . Cancer ([place])    Bilateral upper tract transitional cell carcinoma  . Cardiomegaly   . CHF (congestive heart failure) ([place])   . Complication of anesthesia    Difficult to arouse  . COPD (chronic obstructive pulmonary disease) ([facility])    borderline   pt. denies at preop  . Diabetes mellitus without complication ([place])    Controlled by diet and exercise  . Diverticulosis of sigmoid colon   . Dyspnea    at times  . Dysrhythmia    a fib  . Family history of bladder cancer   . Family history of colon cancer   . Family history of kidney cancer   . Fatty liver   . GERD (gastroesophageal reflux disease)   . History of hiatal hernia 01/20/2015   Small sliding, noted on CT  . History of  kidney stones   . Hyperlipidemia   . Hypertension   . Hypothyroidism   . Lynch syndrome   . PONV (postoperative nausea and vomiting)   . Supraumbilical hernia 16/03/3709   Small noted on CT  . Thyroid disease   . UTI (urinary tract infection)     Patient Active Problem List   Diagnosis Date Noted  . Vaginal yeast infection 03/08/2019  . COPD with acute exacerbation ([place]) 07/30/2018  . Lynch syndrome 05/05/2018  . Genetic testing 04/28/2018  . Adjustment disorder 04/23/2018  . Urothelial cancer ([place]) 04/12/2018  . Lichen planus 62/69/4854  . Obesity hypoventilation syndrome ([place])   . Hypokalemia   . Acute on chronic diastolic (congestive) heart failure ([place]) 01/09/2016  . History of snoring 05/20/2015  . Chronic respiratory failure with hypoxia ([place])   . Type 2 diabetes mellitus without complication ([place])   . Atrial fibrillation ([place]) 01/06/2015  . Hypothyroidism 03/09/2007  . Hyperlipidemia 03/09/2007  . Morbid obesity ([place]) 03/09/2007  . Essential hypertension 03/09/2007  . ALLERGIC RHINITIS 03/09/2007  . Asthma 03/09/2007  . GERD 03/09/2007  . INCONTINENCE 03/09/2007    Past Surgical History:  Procedure Laterality Date  . ABDOMINAL HYSTERECTOMY    . APPENDECTOMY    . CHOLECYSTECTOMY    . COLONOSCOPY  07/16/2009  . CYSTOSCOPY W/ URETERAL STENT PLACEMENT Right 12/04/2017   Procedure: CYSTOSCOPY  WITH RETROGRADE PYELOGRAM/URETERAL STENT PLACEMENT;  Surgeon: Festus Aloe, MD;  Location: [facility];  Service: Urology;  Laterality: Right;  . CYSTOSCOPY W/ URETERAL STENT PLACEMENT Right 02/26/2018   Procedure: CYSTOSCOPY WITH RETROGRADE PYELOGRAM/URETERAL STENT PLACEMENT;  Surgeon: Kathie Rhodes, MD;  Location: [facility];  Service: Urology;  Laterality: Right;  . CYSTOSCOPY WITH URETEROSCOPY AND STENT PLACEMENT Bilateral 01/29/2018   Procedure: BILATERAL URETEROSCOPY AND TUMOR RESECTION  WITH LASER RIGHT STENT EXCHANGE LEFT STENT PLACEMENT;  Surgeon: Ardis Hughs, MD;   Location: [facility];  Service: Urology;  Laterality: Bilateral;  . CYSTOSCOPY WITH URETEROSCOPY AND STENT PLACEMENT Bilateral 02/10/2018   Procedure: BILATERAL URETEROSCOPY AND TUMOR EXCISION BILATERAL STENT EXCHANGE;  Surgeon: Ardis Hughs, MD;  Location: [facility];  Service: Urology;  Laterality: Bilateral;  . CYSTOSCOPY/RETROGRADE/URETEROSCOPY Bilateral 12/24/2017   Procedure: BILATERAL URETEROSCOPY, RIGHT STENT EXCHANGE, STONE EXTRACTION WITH BASKET BILATERAL RETROGRADE PYELOGRAM, BIOPSY OF RIGHT UPPER POLE TUMOR,  BILATERAL RENAL PELVIS FLUID SENT FOR CYTOLOGY;  Surgeon: Ardis Hughs, MD;  Location: [facility];  Service: Urology;  Laterality: Bilateral;  . CYSTOSCOPY/URETEROSCOPY/HOLMIUM LASER/STENT PLACEMENT Bilateral 06/03/2018   Procedure: CYSTOSCOPY/BILATERAL URETEROSCOPY/ LASER TUMOR ABLATION BILATERAL / BILATERAL STENT PLACEMENT/BILATERAL RETROGRADE PYELOGRAM/BLADDER BIOPSY WITH FULGURATION;  Surgeon: Ardis Hughs, MD;  Location: [facility];  Service: Urology;  Laterality: Bilateral;  . HOLMIUM LASER APPLICATION Bilateral 66/11/4032   Procedure: HOLMIUM LASER APPLICATION;  Surgeon: Ardis Hughs, MD;  Location: [facility];  Service: Urology;  Laterality: Bilateral;  . KNEE SURGERY Right   . KNEE SURGERY Left    torn ligament repair  . UPPER GI ENDOSCOPY    . ureteroscopy laser tumor ablation bilateral stent placement     Dr. Louis Meckel 06-03-18      OB History   No obstetric history on file.      Home Medications    Prior to Admission medications   Medication Sig Start Date End Date Taking? Authorizing Provider  acetaminophen (TYLENOL) 500 MG tablet Take 500-1,000 mg by mouth every 8 (eight) hours as needed for moderate pain or headache.     [provider]  atorvastatin (LIPITOR) 20 MG tablet TAKE 1 TABLET BY MOUTH DAILY AT 6 PM 03/07/19   Hoyt Koch, MD  diltiazem (CARDIZEM CD) 180 MG 24 hr capsule Take 1 capsule (180 mg total) by mouth daily. 11/08/18    Hoyt Koch, MD  ELIQUIS 5 MG TABS tablet TAKE 1 TABLET BY MOUTH TWICE A DAY 10/25/18   Hoyt Koch, MD  fluconazole (DIFLUCAN) 150 MG tablet Take 1 tablet (150 mg total) by mouth every 3 (three) days. 03/08/19   Hoyt Koch, MD  furosemide (LASIX) 40 MG tablet Take 2 tablets (80 mg total) by mouth daily. 10/21/18 10/21/19  Hoyt Koch, MD  ipratropium (ATROVENT) 0.02 % nebulizer solution Take 5 mLs (1 mg total) by nebulization every 6 (six) hours as needed for wheezing or shortness of breath. 08/04/18   Georgette Shell, MD  irbesartan (AVAPRO) 150 MG tablet TAKE 1/2 TABLET BY MOUTH DAILY 02/11/19   Hoyt Koch, MD  levalbuterol Penne Lash) 0.63 MG/3ML nebulizer solution Take 3 mLs (0.63 mg total) by nebulization every 6 (six) hours as needed for wheezing or shortness of breath. 08/04/18   Georgette Shell, MD  levothyroxine (SYNTHROID) 100 MCG tablet TAKE 1 TABLET BY MOUTH EVERY DAY 03/18/19   Hoyt Koch, MD  metoprolol succinate (TOPROL-XL) 50 MG 24 hr tablet TAKE 1 TABLET BY MOUTH DAILY  WITH OR IMMEDIATELY FOLLOWING A MEAL. 10/21/18   Hoyt Koch, MD  potassium chloride SA (K-DUR,KLOR-CON) 20 MEQ tablet Take 1 tablet (20 mEq total) by mouth daily. 08/12/18   Hoyt Koch, MD  triamcinolone cream (KENALOG) 0.1 % Apply 1 application topically 2 (two) times daily. 04/01/19   Hoyt Koch, MD  trimethoprim-polymyxin b Mayra Neer) ophthalmic solution Place 1 drop into the right eye every 6 (six) hours. 10/14/18   Delano Metz, FNP    Family History Family History  Problem Relation Age of Onset  . Heart disease Mother   . Colon cancer Mother 76       d. 33  . Tuberculosis Father   . Bladder Cancer Brother        urothelial cancer  . Bladder Cancer Brother        ureter cancer  . Kidney cancer Brother   . Skin cancer Sister   . Cancer Maternal Aunt        NOS  . Colon cancer Other        dx 1st time  under 81, second time at 81  . Lung cancer Other   . Muscular dystrophy Other        d. 86  . Testicular cancer Other 24       great nephew  . Alcohol abuse Daughter   . Drug abuse Daughter   . Schizophrenia Daughter   . Colon cancer Grandchild     Social History Social History   Tobacco Use  . Smoking status: Never Smoker  . Smokeless tobacco: Never Used  Substance Use Topics  . Alcohol use: No    Alcohol/week: 0.0 standard drinks  . Drug use: No     Allergies   Codeine   Review of Systems Review of Systems  Cardiovascular: Negative for chest pain.  Gastrointestinal: Positive for abdominal pain, nausea and vomiting. Negative for constipation, diarrhea, hematemesis and hematochezia.  Genitourinary: Negative for dysuria.  All other systems reviewed and are negative.    Physical Exam Updated Vital Signs BP (!) 111/54 (BP Location: Right Arm)   Pulse 78   Temp 98.3 F (36.8 C) (Oral)   Resp 17   Ht 1.524 m (5')   Wt 104.8 kg   LMP  (LMP Unknown)   SpO2 99%   BMI 45.11 kg/m   Physical Exam CONSTITUTIONAL: Elderly, no acute distress HEAD: Normocephalic/atraumatic EYES: EOMI/PERRL ENMT: Mucous membranes moist NECK: supple no meningeal signs SPINE/BACK:entire spine nontender CV: S1/S2 noted, no murmurs/rubs/gallops noted LUNGS: Lungs are clear to auscultation bilaterally, no apparent distress ABDOMEN: soft, nontender, no rebound or guarding, bowel sounds noted throughout abdomen, obese, abdomen protuberant GU:no cva tenderness NEURO: Pt is awake/alert/appropriate, moves all extremitiesx4.  No facial droop.   EXTREMITIES: pulses normal/equal, full ROM SKIN: warm, color normal PSYCH: no abnormalities of mood noted, alert and oriented to situation   ED Treatments / Results  Labs (all labs ordered are listed, but only abnormal results are displayed) Labs Reviewed  CBC WITH DIFFERENTIAL/PLATELET - Abnormal; Notable for the following components:       Result Value   WBC 16.5 (*)    MCV 101.5 (*)    Neutro Abs 13.5 (*)    Monocytes Absolute 1.1 (*)    All other components within normal limits  COMPREHENSIVE METABOLIC PANEL - Abnormal; Notable for the following components:   Chloride 96 (*)    Glucose, Bld 152 (*)    Creatinine, Ser 1.10 (*)  GFR calc non Af Amer 46 (*)    GFR calc Af Amer 53 (*)    All other components within normal limits  SARS CORONAVIRUS 2 (HOSPITAL ORDER, [facility])  GASTROINTESTINAL PANEL BY PCR, STOOL (REPLACES STOOL CULTURE)  C DIFFICILE QUICK SCREEN W PCR REFLEX  LIPASE, BLOOD  ETHANOL  LACTIC ACID, PLASMA    EKG EKG Interpretation  Date/Time:  Wednesday April 20 2019 02:18:57 EDT Ventricular Rate:  89 PR Interval:    QRS Duration: 99 QT Interval:  419 QTC Calculation: 510 R Axis:   69 Text Interpretation:  Atrial fibrillation Low voltage, precordial leads Nonspecific T abnormalities, lateral leads Prolonged QT interval No significant change since last tracing Confirmed by Ripley Fraise [phone]) on 04/20/2019 2:28:17 AM   Radiology No results found.  Procedures Procedures  Medications Ordered in ED Medications  alum & mag hydroxide-simeth (MAALOX/MYLANTA) 200-200-20 MG/5ML suspension 30 mL (has no administration in time range)  ondansetron (ZOFRAN) injection 4 mg (4 mg Intravenous Given 04/20/19 0340)  loperamide (IMODIUM) capsule 4 mg (4 mg Oral Given 04/20/19 0340)  sodium chloride 0.9 % bolus 1,000 mL (1,000 mLs Intravenous New Bag/Given 04/20/19 0400)  sodium chloride 0.9 % bolus 1,000 mL (1,000 mLs Intravenous New Bag/Given 04/20/19 0523)     Initial Impression / Assessment and Plan / ED Course  I have reviewed the triage vital signs and the nursing notes.  Pertinent labs & imaging results that were available during my care of the patient were reviewed by me and considered in my medical decision making (see chart for details).        3:44 AM  Patient presented with abdominal pain and nausea vomiting.  Her abdominal pain is now resolved, but she is now having explosive large-volume diarrhea, stool is nonbloody. She reports no recent antibiotics at all.  She reports she has not left her house in some time, does not think she is been exposed to COVID-19 We will give IV fluids, Imodium and reassess 5:53 AM Patient feeling improved.  She is still having diarrhea but is improved.  She has mild heartburn.  Denies abdominal pain.  She now suspect this is related to food that she ate yesterday.  She reports raw sausage yesterday COVID-19 test negative. Stool studies are pending, but this will likely be a self limited process.  Patient is requesting discharge  [name] Case was evaluated in Emergency Department on 04/20/2019 for the symptoms described in the history of present illness. She was evaluated in the context of the global COVID-19 pandemic, which necessitated consideration that the patient might be at risk for infection with the SARS-CoV-2 virus that causes COVID-19. Institutional protocols and algorithms that pertain to the evaluation of patients at risk for COVID-19 are in a state of rapid change based on information released by regulatory bodies including the CDC and federal and state organizations. These policies and algorithms were followed during the patient's care in the ED.   Final Clinical Impressions(s) / ED Diagnoses   Final diagnoses:  Diarrhea, unspecified type  Dehydration  Generalized abdominal pain    ED Discharge Orders    None       Ripley Fraise, MD 04/20/19 [phone]

## 2019-04-20 NOTE — ED Triage Notes (Signed)
Pt presents to ED from home BIB [facility]. Pt c/o abdominal pain on R side that radiates across abdomin. Pain is aching, constant, 7/10. EMS VSS

## 2019-04-20 NOTE — ED Provider Notes (Signed)
[facility] Provider Note   CSN: [id number] Arrival date & time: 04/20/19  1508    History   Chief Complaint Chief Complaint  Patient presents with  . Nausea  . Emesis  . Diarrhea    HPI [name] Case is a 85 y.o. female.     The history is provided by the patient.  Diarrhea Quality:  Watery Severity:  Mild Onset quality:  Gradual Duration:  2 days Timing:  Constant Progression:  Unchanged Relieved by:  Nothing Worsened by:  Nothing Associated symptoms: abdominal pain and vomiting   Associated symptoms: no arthralgias, no chills and no fever   Risk factors: suspect food intake     Past Medical History:  Diagnosis Date  . Allergy   . Arthritis   . Atrial fibrillation ([place])   . Bronchitis   . Cancer ([place])    Bilateral upper tract transitional cell carcinoma  . Cardiomegaly   . CHF (congestive heart failure) ([place])   . Complication of anesthesia    Difficult to arouse  . COPD (chronic obstructive pulmonary disease) (HCC)    borderline   pt. denies at preop  . Diabetes mellitus without complication ([place])    Controlled by diet and exercise  . Diverticulosis of sigmoid colon   . Dyspnea    at times  . Dysrhythmia    a fib  . Family history of bladder cancer   . Family history of colon cancer   . Family history of kidney cancer   . Fatty liver   . GERD (gastroesophageal reflux disease)   . History of hiatal hernia 01/20/2015   Small sliding, noted on CT  . History of kidney stones   . Hyperlipidemia   . Hypertension   . Hypothyroidism   . Lynch syndrome   . PONV (postoperative nausea and vomiting)   . Supraumbilical hernia 19/41/7408   Small noted on CT  . Thyroid disease   . UTI (urinary tract infection)     Patient Active Problem List   Diagnosis Date Noted  . Vaginal yeast infection 03/08/2019  . COPD with acute exacerbation ([place]) 07/30/2018  . Lynch syndrome 05/05/2018  . Genetic testing 04/28/2018   . Adjustment disorder 04/23/2018  . Urothelial cancer ([place]) 04/12/2018  . Lichen planus 14/48/1856  . Obesity hypoventilation syndrome ([place])   . Hypokalemia   . Acute on chronic diastolic (congestive) heart failure ([place]) 01/09/2016  . History of snoring 05/20/2015  . Chronic respiratory failure with hypoxia ([place])   . Type 2 diabetes mellitus without complication ([place])   . Atrial fibrillation ([place]) 01/06/2015  . Hypothyroidism 03/09/2007  . Hyperlipidemia 03/09/2007  . Morbid obesity ([place]) 03/09/2007  . Essential hypertension 03/09/2007  . ALLERGIC RHINITIS 03/09/2007  . Asthma 03/09/2007  . GERD 03/09/2007  . INCONTINENCE 03/09/2007    Past Surgical History:  Procedure Laterality Date  . ABDOMINAL HYSTERECTOMY    . APPENDECTOMY    . CHOLECYSTECTOMY    . COLONOSCOPY  07/16/2009  . CYSTOSCOPY W/ URETERAL STENT PLACEMENT Right 12/04/2017   Procedure: CYSTOSCOPY WITH RETROGRADE PYELOGRAM/URETERAL STENT PLACEMENT;  Surgeon: Festus Aloe, MD;  [facility];  Service: Urology;  Laterality: Right;  . CYSTOSCOPY W/ URETERAL STENT PLACEMENT Right 02/26/2018   Procedure: CYSTOSCOPY WITH RETROGRADE PYELOGRAM/URETERAL STENT PLACEMENT;  Surgeon: Kathie Rhodes, MD;  [facility];  Service: Urology;  Laterality: Right;  . CYSTOSCOPY WITH URETEROSCOPY AND STENT PLACEMENT Bilateral 01/29/2018   Procedure: BILATERAL URETEROSCOPY AND TUMOR  RESECTION  WITH LASER RIGHT STENT EXCHANGE LEFT STENT PLACEMENT;  Surgeon: Ardis Hughs, MD;  [facility];  Service: Urology;  Laterality: Bilateral;  . CYSTOSCOPY WITH URETEROSCOPY AND STENT PLACEMENT Bilateral 02/10/2018   Procedure: BILATERAL URETEROSCOPY AND TUMOR EXCISION BILATERAL STENT EXCHANGE;  Surgeon: Ardis Hughs, MD;  [facility];  Service: Urology;  Laterality: Bilateral;  . CYSTOSCOPY/RETROGRADE/URETEROSCOPY Bilateral 12/24/2017   Procedure: BILATERAL URETEROSCOPY, RIGHT STENT EXCHANGE, STONE EXTRACTION WITH BASKET  BILATERAL RETROGRADE PYELOGRAM, BIOPSY OF RIGHT UPPER POLE TUMOR,  BILATERAL RENAL PELVIS FLUID SENT FOR CYTOLOGY;  Surgeon: Ardis Hughs, MD;  [facility];  Service: Urology;  Laterality: Bilateral;  . CYSTOSCOPY/URETEROSCOPY/HOLMIUM LASER/STENT PLACEMENT Bilateral 06/03/2018   Procedure: CYSTOSCOPY/BILATERAL URETEROSCOPY/ LASER TUMOR ABLATION BILATERAL / BILATERAL STENT PLACEMENT/BILATERAL RETROGRADE PYELOGRAM/BLADDER BIOPSY WITH FULGURATION;  Surgeon: Ardis Hughs, MD;  [facility];  Service: Urology;  Laterality: Bilateral;  . HOLMIUM LASER APPLICATION Bilateral 16/09/958   Procedure: HOLMIUM LASER APPLICATION;  Surgeon: Ardis Hughs, MD;  [facility];  Service: Urology;  Laterality: Bilateral;  . KNEE SURGERY Right   . KNEE SURGERY Left    torn ligament repair  . UPPER GI ENDOSCOPY    . ureteroscopy laser tumor ablation bilateral stent placement     Dr. Louis Meckel 06-03-18      OB History   No obstetric history on file.      Home Medications    Prior to Admission medications   Medication Sig Start Date End Date Taking? Authorizing Provider  acetaminophen (TYLENOL) 500 MG tablet Take 500-1,000 mg by mouth every 8 (eight) hours as needed for moderate pain or headache.    Yes [provider]  atorvastatin (LIPITOR) 20 MG tablet TAKE 1 TABLET BY MOUTH DAILY AT 6 PM Patient taking differently: Take 20 mg by mouth daily.  03/07/19  Yes Hoyt Koch, MD  diltiazem (CARDIZEM CD) 180 MG 24 hr capsule Take 1 capsule (180 mg total) by mouth daily. 11/08/18  Yes Hoyt Koch, MD  ELIQUIS 5 MG TABS tablet TAKE 1 TABLET BY MOUTH TWICE A DAY Patient taking differently: Take 5 mg by mouth daily.  10/25/18  Yes Hoyt Koch, MD  furosemide (LASIX) 40 MG tablet Take 2 tablets (80 mg total) by mouth daily. 10/21/18 10/21/19 Yes Hoyt Koch, MD  ipratropium (ATROVENT) 0.02 % nebulizer solution Take 5 mLs (1 mg total) by nebulization  every 6 (six) hours as needed for wheezing or shortness of breath. 08/04/18  Yes Georgette Shell, MD  irbesartan (AVAPRO) 150 MG tablet TAKE 1/2 TABLET BY MOUTH DAILY Patient taking differently: Take 75 mg by mouth daily.  02/11/19  Yes Hoyt Koch, MD  levothyroxine (SYNTHROID) 100 MCG tablet TAKE 1 TABLET BY MOUTH EVERY DAY 03/18/19  Yes Hoyt Koch, MD  metoprolol succinate (TOPROL-XL) 50 MG 24 hr tablet TAKE 1 TABLET BY MOUTH DAILY WITH OR IMMEDIATELY FOLLOWING A MEAL. 10/21/18  Yes Hoyt Koch, MD  potassium chloride SA (K-DUR,KLOR-CON) 20 MEQ tablet Take 1 tablet (20 mEq total) by mouth daily. 08/12/18  Yes Hoyt Koch, MD  promethazine (PHENERGAN) 25 MG tablet Take 1 tablet (25 mg total) by mouth every 8 (eight) hours as needed for nausea or vomiting. 04/20/19  Yes Ripley Fraise, MD  triamcinolone cream (KENALOG) 0.1 % Apply 1 application topically 2 (two) times daily. 04/01/19  Yes Hoyt Koch, MD  trimethoprim-polymyxin b Mayra Neer) ophthalmic solution Place 1 drop into the  right eye every 6 (six) hours. 10/14/18  Yes Kordsmeier, Gregary Signs, FNP  ondansetron (ZOFRAN) 4 MG tablet Take 1 tablet (4 mg total) by mouth every 8 (eight) hours as needed for up to 15 doses for nausea or vomiting. 04/20/19   Lennice Sites, DO    Family History Family History  Problem Relation Age of Onset  . Heart disease Mother   . Colon cancer Mother 35       d. 25  . Tuberculosis Father   . Bladder Cancer Brother        urothelial cancer  . Bladder Cancer Brother        ureter cancer  . Kidney cancer Brother   . Skin cancer Sister   . Cancer Maternal Aunt        NOS  . Colon cancer Other        dx 1st time under 62, second time at 70  . Lung cancer Other   . Muscular dystrophy Other        d. 5  . Testicular cancer Other 24       great nephew  . Alcohol abuse Daughter   . Drug abuse Daughter   . Schizophrenia Daughter   . Colon cancer Grandchild      Social History Social History   Tobacco Use  . Smoking status: Never Smoker  . Smokeless tobacco: Never Used  Substance Use Topics  . Alcohol use: No    Alcohol/week: 0.0 standard drinks  . Drug use: No     Allergies   Codeine   Review of Systems Review of Systems  Constitutional: Negative for chills and fever.  HENT: Negative for ear pain and sore throat.   Eyes: Negative for pain and visual disturbance.  Respiratory: Negative for cough and shortness of breath.   Cardiovascular: Negative for chest pain and palpitations.  Gastrointestinal: Positive for abdominal pain, diarrhea, nausea and vomiting. Negative for abdominal distention and blood in stool.  Genitourinary: Negative for dysuria and hematuria.  Musculoskeletal: Negative for arthralgias and back pain.  Skin: Negative for color change and rash.  Neurological: Negative for seizures and syncope.  All other systems reviewed and are negative.    Physical Exam Updated Vital Signs  ED Triage Vitals  Enc Vitals Group     BP 04/20/19 1515 (!) 111/56     Pulse --      Resp --      Temp 04/20/19 1514 99.8 F (37.7 C)     Temp Source 04/20/19 1514 Rectal     SpO2 --      Weight --      Height --      Head Circumference --      Peak Flow --      Pain Score 04/20/19 1515 0     Pain Loc --      Pain Edu? --      Excl. in [place]? --     Physical Exam Vitals signs and nursing note reviewed.  Constitutional:      General: She is not in acute distress.    Appearance: She is well-developed.  HENT:     Head: Normocephalic and atraumatic.     Nose: Nose normal.     Mouth/Throat:     Mouth: Mucous membranes are moist.  Eyes:     Extraocular Movements: Extraocular movements intact.     Conjunctiva/sclera: Conjunctivae normal.     Pupils: Pupils are equal, round, and reactive to light.  Neck:     Musculoskeletal: Normal range of motion and neck supple.  Cardiovascular:     Rate and Rhythm: Normal rate and  regular rhythm.     Pulses: Normal pulses.     Heart sounds: Normal heart sounds. No murmur.  Pulmonary:     Effort: Pulmonary effort is normal. No respiratory distress.     Breath sounds: Normal breath sounds.  Abdominal:     General: There is distension.     Palpations: Abdomen is soft. There is no mass.     Tenderness: There is abdominal tenderness. There is no guarding or rebound.     Hernia: No hernia is present.  Musculoskeletal:     Right lower leg: No edema.     Left lower leg: No edema.  Skin:    General: Skin is warm and dry.     Capillary Refill: Capillary refill takes less than 2 seconds.  Neurological:     General: No focal deficit present.     Mental Status: She is alert.      ED Treatments / Results  Labs (all labs ordered are listed, but only abnormal results are displayed) Labs Reviewed  CBC WITH DIFFERENTIAL/PLATELET - Abnormal; Notable for the following components:      Result Value   WBC 11.1 (*)    RBC 3.40 (*)    Hemoglobin 10.9 (*)    HCT 34.4 (*)    MCV 101.2 (*)    Neutro Abs 9.5 (*)    All other components within normal limits  COMPREHENSIVE METABOLIC PANEL - Abnormal; Notable for the following components:   Glucose, Bld 123 (*)    Total Protein 6.0 (*)    Albumin 3.1 (*)    GFR calc non Af Amer 53 (*)    All other components within normal limits  LIPASE, BLOOD    EKG None  Radiology No results found.  Procedures Procedures (including critical care time)  Medications Ordered in ED Medications  sodium chloride 0.9 % bolus 1,000 mL (1,000 mLs Intravenous New Bag/Given 04/20/19 1617)  prochlorperazine (COMPAZINE) injection 10 mg (10 mg Intravenous Given 04/20/19 1618)     Initial Impression / Assessment and Plan / ED Course  I have reviewed the triage vital signs and the nursing notes.  Pertinent labs & imaging results that were available during my care of the patient were reviewed by me and considered in my medical decision making  (see chart for details).     [name] Case is an 84 year old female with history of high cholesterol, hypertension, COPD, heart failure who presents the ED with ongoing nausea, vomiting, diarrhea.  Symptoms ongoing for the last 2 days after eating possibly uncooked kielbasa.  Was seen last night in the ED and had improvement following IV fluids and IV nausea medicine.  However symptoms slowly have progressed.  Has had difficulty with tolerating p.o.  C. difficile work-up and GI pathogen panel were negative.  She did not have any imaging done.  Patient continues to have some diffuse crampy abdominal pain.  Had tenderness to palpation on exam.  Has some distention.  No signs to suggest peritonitis.  Overall vitals are unremarkable.  Still has mild leukocytosis but otherwise lab work is unremarkable.  Lipase normal.  No significant gallbladder liver enzyme elevation.  Offered the patient a CT scan of her abdomen and pelvis to rule out other etiologies for issues including perforation, abscess, infectious process.  However she felt better after IV fluids  and IV Compazine and want to leave [place].  Shared decision was made and patient was discharged in the ED. she understands the risks and benefits of staying for imaging.  Believe she would also benefit from IV hydration and IV nausea control overnight as well but patient feels better.  She understands that we may be missing a bigger emergent problem.  However she is willing to try outpatient treatment at this time.  Suspect that this is likely a foodborne illness.  Given Zofran prescription.  Given return precautions and discharged in ED in good condition.  This chart was dictated using voice recognition software.  Despite best efforts to proofread,  errors can occur which can change the documentation meaning.    Final Clinical Impressions(s) / ED Diagnoses   Final diagnoses:  Nausea vomiting and diarrhea    ED Discharge Orders          Ordered    ondansetron (ZOFRAN) 4 MG tablet  Every 8 hours PRN     04/20/19 1814           Lennice Sites, DO 04/20/19 1815

## 2019-04-20 NOTE — Discharge Instructions (Signed)
Please return to the emergency department if symptoms worsen.  I believe you would benefit from a CT scan of your abdomen and pelvis as we discussed.  Hopefully symptoms will self resolve with increase oral hydration and Zofran.

## 2019-04-20 NOTE — ED Notes (Signed)
Discharge instructions discussed with pt. Pt verbalized understanding. Pt stable and ambulatory. No signature pad available. 

## 2019-04-20 NOTE — ED Triage Notes (Signed)
Pt here via ptar with nvd x2days. Pt co increased weakness. Pt seen here yesterday and was sent home and didn't feel better so she came back.

## 2019-04-21 NOTE — Progress Notes (Deleted)
Cardiology Office Note   Date:  04/21/2019   ID:  [name] Case, [name] Case 1932-03-28, MRN [id number]  PCP:  Hoyt Koch, MD  Cardiologist:  ***    No chief complaint on file.     History of Present Illness: [name] Case is a 85 y.o. female who presents for ***   hx of permanent atrial fibrillation, HTN, HLD,  COPD and thyroid disease presents for follow up.   Hx of permanent atrial fibrillation with dilated left atrium 47 mm. On Eliquis for anticoagulation. Last seen by Dr. Marlou Porch 08/2017.  Treated with prednisone of recent COPD exacerbation 09/2018.  Here today for follow-up.  For the past 6 weeks patient has noted gradually worsening of lower extremity edema with 6 pound weight gain.  No chest pain.  On oxygen chronically due to COPD.  No exacerbation of dyspnea.  Denies palpitation or melena.  Sleeps on recliner chronically.  No melena or syncope.  Compliant with low-sodium diet.  Recent N&V   Past Medical History:  Diagnosis Date  . Allergy   . Arthritis   . Atrial fibrillation ([place])   . Bronchitis   . Cancer ([place])    Bilateral upper tract transitional cell carcinoma  . Cardiomegaly   . CHF (congestive heart failure) ([place])   . Complication of anesthesia    Difficult to arouse  . COPD (chronic obstructive pulmonary disease) ([facility])    borderline   pt. denies at preop  . Diabetes mellitus without complication ([place])    Controlled by diet and exercise  . Diverticulosis of sigmoid colon   . Dyspnea    at times  . Dysrhythmia    a fib  . Family history of bladder cancer   . Family history of colon cancer   . Family history of kidney cancer   . Fatty liver   . GERD (gastroesophageal reflux disease)   . History of hiatal hernia 01/20/2015   Small sliding, noted on CT  . History of kidney stones   . Hyperlipidemia   . Hypertension   . Hypothyroidism   . Lynch syndrome   . PONV (postoperative nausea and vomiting)   . Supraumbilical hernia  43/15/4008   Small noted on CT  . Thyroid disease   . UTI (urinary tract infection)     Past Surgical History:  Procedure Laterality Date  . ABDOMINAL HYSTERECTOMY    . APPENDECTOMY    . CHOLECYSTECTOMY    . COLONOSCOPY  07/16/2009  . CYSTOSCOPY W/ URETERAL STENT PLACEMENT Right 12/04/2017   Procedure: CYSTOSCOPY WITH RETROGRADE PYELOGRAM/URETERAL STENT PLACEMENT;  Surgeon: Festus Aloe, MD;  [facility];  Service: Urology;  Laterality: Right;  . CYSTOSCOPY W/ URETERAL STENT PLACEMENT Right 02/26/2018   Procedure: CYSTOSCOPY WITH RETROGRADE PYELOGRAM/URETERAL STENT PLACEMENT;  Surgeon: Kathie Rhodes, MD;  [facility];  Service: Urology;  Laterality: Right;  . CYSTOSCOPY WITH URETEROSCOPY AND STENT PLACEMENT Bilateral 01/29/2018   Procedure: BILATERAL URETEROSCOPY AND TUMOR RESECTION  WITH LASER RIGHT STENT EXCHANGE LEFT STENT PLACEMENT;  Surgeon: Ardis Hughs, MD;  [facility];  Service: Urology;  Laterality: Bilateral;  . CYSTOSCOPY WITH URETEROSCOPY AND STENT PLACEMENT Bilateral 02/10/2018   Procedure: BILATERAL URETEROSCOPY AND TUMOR EXCISION BILATERAL STENT EXCHANGE;  Surgeon: Ardis Hughs, MD;  [facility];  Service: Urology;  Laterality: Bilateral;  . CYSTOSCOPY/RETROGRADE/URETEROSCOPY Bilateral 12/24/2017   Procedure: BILATERAL URETEROSCOPY, RIGHT STENT EXCHANGE, STONE EXTRACTION WITH BASKET BILATERAL RETROGRADE PYELOGRAM, BIOPSY OF RIGHT UPPER POLE TUMOR,  BILATERAL RENAL PELVIS FLUID SENT FOR CYTOLOGY;  Surgeon: Ardis Hughs, MD;  [facility];  Service: Urology;  Laterality: Bilateral;  . CYSTOSCOPY/URETEROSCOPY/HOLMIUM LASER/STENT PLACEMENT Bilateral 06/03/2018   Procedure: CYSTOSCOPY/BILATERAL URETEROSCOPY/ LASER TUMOR ABLATION BILATERAL / BILATERAL STENT PLACEMENT/BILATERAL RETROGRADE PYELOGRAM/BLADDER BIOPSY WITH FULGURATION;  Surgeon: Ardis Hughs, MD;  [facility];  Service: Urology;  Laterality: Bilateral;  . HOLMIUM  LASER APPLICATION Bilateral 06/09/3234   Procedure: HOLMIUM LASER APPLICATION;  Surgeon: Ardis Hughs, MD;  [facility];  Service: Urology;  Laterality: Bilateral;  . KNEE SURGERY Right   . KNEE SURGERY Left    torn ligament repair  . UPPER GI ENDOSCOPY    . ureteroscopy laser tumor ablation bilateral stent placement     Dr. Louis Meckel 06-03-18      Current Outpatient Medications  Medication Sig Dispense Refill  . acetaminophen (TYLENOL) 500 MG tablet Take 500-1,000 mg by mouth every 8 (eight) hours as needed for moderate pain or headache.     [name] atorvastatin (LIPITOR) 20 MG tablet TAKE 1 TABLET BY MOUTH DAILY AT 6 PM (Patient taking differently: Take 20 mg by mouth daily. ) 90 tablet 1  . diltiazem (CARDIZEM CD) 180 MG 24 hr capsule Take 1 capsule (180 mg total) by mouth daily. 90 capsule 1  . ELIQUIS 5 MG TABS tablet TAKE 1 TABLET BY MOUTH TWICE A DAY (Patient taking differently: Take 5 mg by mouth daily. ) 180 tablet 1  . furosemide (LASIX) 40 MG tablet Take 2 tablets (80 mg total) by mouth daily. 180 tablet 3  . ipratropium (ATROVENT) 0.02 % nebulizer solution Take 5 mLs (1 mg total) by nebulization every 6 (six) hours as needed for wheezing or shortness of breath. 75 mL 12  . irbesartan (AVAPRO) 150 MG tablet TAKE 1/2 TABLET BY MOUTH DAILY (Patient taking differently: Take 75 mg by mouth daily. ) 45 tablet 1  . levothyroxine (SYNTHROID) 100 MCG tablet TAKE 1 TABLET BY MOUTH EVERY DAY 90 tablet 1  . metoprolol succinate (TOPROL-XL) 50 MG 24 hr tablet TAKE 1 TABLET BY MOUTH DAILY WITH OR IMMEDIATELY FOLLOWING A MEAL. 90 tablet 3  . ondansetron (ZOFRAN) 4 MG tablet Take 1 tablet (4 mg total) by mouth every 8 (eight) hours as needed for up to 15 doses for nausea or vomiting. 15 tablet 0  . potassium chloride SA (K-DUR,KLOR-CON) 20 MEQ tablet Take 1 tablet (20 mEq total) by mouth daily. 90 tablet 3  . promethazine (PHENERGAN) 25 MG tablet Take 1 tablet (25 mg total) by mouth every 8  (eight) hours as needed for nausea or vomiting. 15 tablet 0  . triamcinolone cream (KENALOG) 0.1 % Apply 1 application topically 2 (two) times daily. 100 g 0  . trimethoprim-polymyxin b (POLYTRIM) ophthalmic solution Place 1 drop into the right eye every 6 (six) hours. 10 mL 0   No current facility-administered medications for this visit.     Allergies:   Codeine    Social History:  The patient  reports that she has never smoked. She has never used smokeless tobacco. She reports that she does not drink alcohol or use drugs.   Family History:  The patient's ***family history includes Alcohol abuse in her daughter; Bladder Cancer in her brother and brother; Cancer in her maternal aunt; Colon cancer in her grandchild and another family member; Colon cancer (age of onset: 35) in her mother; Drug abuse in her daughter; Heart disease in her mother; Kidney cancer in her  brother; Lung cancer in an other family member; Muscular dystrophy in an other family member; Schizophrenia in her daughter; Skin cancer in her sister; Testicular cancer (age of onset: 70) in an other family member; Tuberculosis in her father.    ROS:  General:no colds or fevers, no weight changes Skin:no rashes or ulcers HEENT:no blurred vision, no congestion CV:see HPI PUL:see HPI GI:no diarrhea constipation or melena, no indigestion GU:no hematuria, no dysuria MS:no joint pain, no claudication Neuro:no syncope, no lightheadedness Endo:no diabetes, no thyroid disease Wt Readings from Last 3 Encounters:  04/20/19 231 lb (104.8 kg)  11/03/18 226 lb (102.5 kg)  10/28/18 230 lb (104.3 kg)     PHYSICAL EXAM: VS:  LMP  (LMP Unknown)  , BMI There is no height or weight on file to calculate BMI. General:Pleasant affect, NAD Skin:Warm and dry, brisk capillary refill HEENT:normocephalic, sclera clear, mucus membranes moist Neck:supple, no JVD, no bruits  Heart:S1S2 RRR without murmur, gallup, rub or click Lungs:clear without  rales, rhonchi, or wheezes TDS:KAJG, non tender, + BS, do not palpate liver spleen or masses Ext:no lower ext edema, 2+ pedal pulses, 2+ radial pulses Neuro:alert and oriented, MAE, follows commands, + facial symmetry    EKG:  EKG is ordered today. The ekg ordered today demonstrates ***   Recent Labs: 08/02/2018: TSH 0.764 08/04/2018: Magnesium 1.9 09/19/2018: B Natriuretic Peptide 199.3 04/20/2019: ALT 17; BUN 16; Creatinine, Ser 0.98; Hemoglobin 10.9; Platelets 223; Potassium 4.1; Sodium 141    Lipid Panel    Component Value Date/Time   CHOL 146 02/27/2017 1000   TRIG 114 02/27/2017 1000   HDL 48 02/27/2017 1000   CHOLHDL 3.0 02/27/2017 1000   CHOLHDL 4 06/08/2015 1126   VLDL 29.4 06/08/2015 1126   LDLCALC 75 02/27/2017 1000   LDLDIRECT 188.6 07/19/2007 0000       Other studies Reviewed: Additional studies/ records that were reviewed today include: ***.   ASSESSMENT AND PLAN:  1.  ***   Current medicines are reviewed with the patient today.  The patient Has no concerns regarding medicines.  The following changes have been made:  See above Labs/ tests ordered today include:see above  Disposition:   FU:  see above  Signed, Cecilie Kicks, NP  04/21/2019 10:37 PM    [facility] [address], [place], [place], [place] Phone: [phone]; Fax: [phone]

## 2019-04-22 ENCOUNTER — Ambulatory Visit: Payer: Medicare Other | Admitting: Cardiology

## 2019-04-22 ENCOUNTER — Telehealth: Payer: Self-pay | Admitting: *Deleted

## 2019-04-22 ENCOUNTER — Encounter: Payer: Self-pay | Admitting: Cardiology

## 2019-04-22 ENCOUNTER — Telehealth (INDEPENDENT_AMBULATORY_CARE_PROVIDER_SITE_OTHER): Payer: Medicare Other | Admitting: Cardiology

## 2019-04-22 ENCOUNTER — Encounter: Payer: Self-pay | Admitting: *Deleted

## 2019-04-22 VITALS — BP 100/77 | HR 72 | Ht 60.0 in | Wt 228.0 lb

## 2019-04-22 DIAGNOSIS — I4821 Permanent atrial fibrillation: Secondary | ICD-10-CM

## 2019-04-22 NOTE — Progress Notes (Signed)
Virtual Visit via Telephone Note   This visit type was conducted due to national recommendations for restrictions regarding the COVID-19 Pandemic (e.g. social distancing) in an effort to limit this patient's exposure and mitigate transmission in our community.  Due to her co-morbid illnesses, this patient is at least at moderate risk for complications without adequate follow up.  This format is felt to be most appropriate for this patient at this time.  The patient did not have access to video technology/had technical difficulties with video requiring transitioning to audio format only (telephone).  All issues noted in this document were discussed and addressed.  No physical exam could be performed with this format.  Please refer to the patient's chart for her  consent to telehealth for [facility].   Date:  04/22/2019   ID:  [name] Case, DOB Jul 31, 1932, MRN [id number]  Patient Location: Home Provider Location: Office  PCP:  Hoyt Koch, MD  Cardiologist:  Candee Furbish, MD    Electrophysiologist:  None   Evaluation Performed:  Follow-Up Visit  Chief Complaint:  Atrial fib   History of Present Illness:    [name] Case is a 85 y.o. female with permanent atrial fib, HTN, HLD, COPD and thyroid disease presents for follow up. dilated left atrium 47 mm.  Treated with prednisone of recent COPD exacerbation 09/2018.  10/11/18 was seen with  lower extremity edema with 6 pound weight gain. No chest pain. On oxygen chronically due to COPD. No exacerbation of dyspnea. Denies palpitation or melena. diuretic increased for 3 days and then resolved.   Today we made this a phone visit instead of in person due to recent N&V & diarrhea and just seen twice in ER.  Neg COVID, neg c. Diff.  Thought to be food poisoning.  Now seem resolevd  And BP at lower end due to dehydration.   No chest pain and her SOB is stable on home oxygen. occ feels tachycardia but in permanent a fib.  No  bleeding with her Eliquis.      The patient does not have symptoms concerning for COVID-19 infection (fever, chills, cough, or new shortness of breath).    Past Medical History:  Diagnosis Date   Allergy    Arthritis    Atrial fibrillation ([place])    Bronchitis    Cancer (HCC)    Bilateral upper tract transitional cell carcinoma   Cardiomegaly    CHF (congestive heart failure) (HCC)    Complication of anesthesia    Difficult to arouse   COPD (chronic obstructive pulmonary disease) (HCC)    borderline   pt. denies at preop   Diabetes mellitus without complication ([place])    Controlled by diet and exercise   Diverticulosis of sigmoid colon    Dyspnea    at times   Dysrhythmia    a fib   Family history of bladder cancer    Family history of colon cancer    Family history of kidney cancer    Fatty liver    GERD (gastroesophageal reflux disease)    History of hiatal hernia 01/20/2015   Small sliding, noted on CT   History of kidney stones    Hyperlipidemia    Hypertension    Hypothyroidism    Lynch syndrome    PONV (postoperative nausea and vomiting)    Supraumbilical hernia 0000000   Small noted on CT   Thyroid disease    UTI (urinary tract infection)    Past  Surgical History:  Procedure Laterality Date   ABDOMINAL HYSTERECTOMY     APPENDECTOMY     CHOLECYSTECTOMY     COLONOSCOPY  07/16/2009   CYSTOSCOPY W/ URETERAL STENT PLACEMENT Right 12/04/2017   Procedure: CYSTOSCOPY WITH RETROGRADE PYELOGRAM/URETERAL STENT PLACEMENT;  Surgeon: Festus Aloe, MD;  Location: [facility];  Service: Urology;  Laterality: Right;   CYSTOSCOPY W/ URETERAL STENT PLACEMENT Right 02/26/2018   Procedure: CYSTOSCOPY WITH RETROGRADE PYELOGRAM/URETERAL STENT PLACEMENT;  Surgeon: Kathie Rhodes, MD;  Location: [facility];  Service: Urology;  Laterality: Right;   CYSTOSCOPY WITH URETEROSCOPY AND STENT PLACEMENT Bilateral 01/29/2018   Procedure: BILATERAL URETEROSCOPY  AND TUMOR RESECTION  WITH LASER RIGHT STENT EXCHANGE LEFT STENT PLACEMENT;  Surgeon: Ardis Hughs, MD;  Location: [facility];  Service: Urology;  Laterality: Bilateral;   CYSTOSCOPY WITH URETEROSCOPY AND STENT PLACEMENT Bilateral 02/10/2018   Procedure: BILATERAL URETEROSCOPY AND TUMOR EXCISION BILATERAL STENT EXCHANGE;  Surgeon: Ardis Hughs, MD;  Location: [facility];  Service: Urology;  Laterality: Bilateral;   CYSTOSCOPY/RETROGRADE/URETEROSCOPY Bilateral 12/24/2017   Procedure: BILATERAL URETEROSCOPY, RIGHT STENT EXCHANGE, STONE EXTRACTION WITH BASKET BILATERAL RETROGRADE PYELOGRAM, BIOPSY OF RIGHT UPPER POLE TUMOR,  BILATERAL RENAL PELVIS FLUID SENT FOR CYTOLOGY;  Surgeon: Ardis Hughs, MD;  Location: [facility];  Service: Urology;  Laterality: Bilateral;   CYSTOSCOPY/URETEROSCOPY/HOLMIUM LASER/STENT PLACEMENT Bilateral 06/03/2018   Procedure: CYSTOSCOPY/BILATERAL URETEROSCOPY/ LASER TUMOR ABLATION BILATERAL / BILATERAL STENT PLACEMENT/BILATERAL RETROGRADE PYELOGRAM/BLADDER BIOPSY WITH FULGURATION;  Surgeon: Ardis Hughs, MD;  Location: [facility];  Service: Urology;  Laterality: Bilateral;   HOLMIUM LASER APPLICATION Bilateral [id number]   Procedure: HOLMIUM LASER APPLICATION;  Surgeon: Ardis Hughs, MD;  Location: [facility];  Service: Urology;  Laterality: Bilateral;   KNEE SURGERY Right    KNEE SURGERY Left    torn ligament repair   UPPER GI ENDOSCOPY     ureteroscopy laser tumor ablation bilateral stent placement     Dr. Louis Meckel 06-03-18      Current Meds  Medication Sig   acetaminophen (TYLENOL) 500 MG tablet Take 500-1,000 mg by mouth every 8 (eight) hours as needed for moderate pain or headache.    atorvastatin (LIPITOR) 20 MG tablet TAKE 1 TABLET BY MOUTH DAILY AT 6 PM   diltiazem (CARDIZEM CD) 180 MG 24 hr capsule Take 1 capsule (180 mg total) by mouth daily.   ELIQUIS 5 MG TABS tablet TAKE 1 TABLET BY MOUTH TWICE A DAY   furosemide (LASIX) 40 MG tablet  Take 2 tablets (80 mg total) by mouth daily.   ipratropium (ATROVENT) 0.02 % nebulizer solution Take 5 mLs (1 mg total) by nebulization every 6 (six) hours as needed for wheezing or shortness of breath.   irbesartan (AVAPRO) 150 MG tablet TAKE 1/2 TABLET BY MOUTH DAILY   levothyroxine (SYNTHROID) 100 MCG tablet TAKE 1 TABLET BY MOUTH EVERY DAY   metoprolol succinate (TOPROL-XL) 50 MG 24 hr tablet TAKE 1 TABLET BY MOUTH DAILY WITH OR IMMEDIATELY FOLLOWING A MEAL.   ondansetron (ZOFRAN) 4 MG tablet Take 1 tablet (4 mg total) by mouth every 8 (eight) hours as needed for up to 15 doses for nausea or vomiting.   potassium chloride SA (K-DUR,KLOR-CON) 20 MEQ tablet Take 1 tablet (20 mEq total) by mouth daily.   triamcinolone cream (KENALOG) 0.1 % Apply 1 application topically 2 (two) times daily.   trimethoprim-polymyxin b (POLYTRIM) ophthalmic solution Place 1 drop into the right eye every 6 (six) hours.     Allergies:  Codeine   Social History   Tobacco Use   Smoking status: Never Smoker   Smokeless tobacco: Never Used  Substance Use Topics   Alcohol use: No    Alcohol/week: 0.0 standard drinks   Drug use: No     Family Hx: The patient's family history includes Alcohol abuse in her daughter; Bladder Cancer in her brother and brother; Cancer in her maternal aunt; Colon cancer in her grandchild and another family member; Colon cancer (age of onset: 71) in her mother; Drug abuse in her daughter; Heart disease in her mother; Kidney cancer in her brother; Lung cancer in an other family member; Muscular dystrophy in an other family member; Schizophrenia in her daughter; Skin cancer in her sister; Testicular cancer (age of onset: 10) in an other family member; Tuberculosis in her father.  ROS:   Please see the history of present illness.    General:no colds or fevers, no weight changes Skin:no rashes or ulcers HEENT:no blurred vision, no congestion CV:see HPI PUL:see HPI GI:+  diarrhea neg C. diff constipation or melena, no indigestion GU:no hematuria, no dysuria MS:no joint pain, no claudication Neuro:no syncope, no lightheadedness Endo:+ diabetes, + thyroid disease  All other systems reviewed and are negative.   Prior CV studies:   The following studies were reviewed today:  ECHO 2017  Study Conclusions  - Left ventricle: The cavity size was normal. Systolic function was   normal. The estimated ejection fraction was in the range of 50%   to 55%. Wall motion was normal; there were no regional wall   motion abnormalities. - Aortic valve: Transvalvular velocity was within the normal range.   There was no stenosis. There was no regurgitation. - Mitral valve: There was trivial regurgitation. - Left atrium: The atrium was mildly dilated. - Right ventricle: The cavity size was normal. Wall thickness was   normal. Systolic function was normal. - Tricuspid valve: There was trivial regurgitation. - Pulmonary arteries: Systolic pressure was within the normal   range. - Inferior vena cava: The vessel was normal in size. The   respirophasic diameter changes were in the normal range (>= 50%),   consistent with normal central venous pressure.   Labs/Other Tests and Data Reviewed:    EKG:  An ECG dated 04/20/19 was personally reviewed today and demonstrated:  atrail fib rate controlled and no ST changes from prior   Recent Labs: 08/02/2018: TSH 0.764 08/04/2018: Magnesium 1.9 09/19/2018: B Natriuretic Peptide 199.3 04/20/2019: ALT 17; BUN 16; Creatinine, Ser 0.98; Hemoglobin 10.9; Platelets 223; Potassium 4.1; Sodium 141   Recent Lipid Panel Lab Results  Component Value Date/Time   CHOL 146 02/27/2017 10:00 AM   TRIG 114 02/27/2017 10:00 AM   HDL 48 02/27/2017 10:00 AM   CHOLHDL 3.0 02/27/2017 10:00 AM   CHOLHDL 4 06/08/2015 11:26 AM   LDLCALC 75 02/27/2017 10:00 AM   LDLDIRECT 188.6 07/19/2007 12:00 AM    Wt Readings from Last 3 Encounters:    04/22/19 228 lb (103.4 kg)  04/20/19 231 lb (104.8 kg)  11/03/18 226 lb (102.5 kg)     Objective:    Vital Signs:  BP 100/77    Pulse 72    Ht 5' (1.524 m)    Wt 228 lb (103.4 kg)    LMP  (LMP Unknown)    BMI 44.53 kg/m    VITAL SIGNS:  reviewed  General NAD, still a little weak from diarrhea Neuro A&O X 3 MAE follows commands Lungs, can  speak in complete sentences without SOB. Psych:  Pleasant affect.   ASSESSMENT & PLAN:    1. Permanent a fib  On eliquis with no bleeding, mostly unaware of a fib, every once in awhile she has fast beats. On BB, Ca+ blocker 2. Anticoagulation. On eliquis with no bleeding 3. HTN lower today after diarrhea neg c. Diff, thought to be food poisoning , COVID neg. 4. Hx of COPD followed by PCP 5. Hx diastolic HF presumed no edema or SOB  COVID-19 Education: The signs and symptoms of COVID-19 were discussed with the patient and how to seek care for testing (follow up with PCP or arrange E-visit).  The importance of social distancing was discussed today.  Time:   Today, I have spent 5 minutes with the patient with telehealth technology discussing the above problems.     Medication Adjustments/Labs and Tests Ordered: Current medicines are reviewed at length with the patient today.  Concerns regarding medicines are outlined above.   Tests Ordered: No orders of the defined types were placed in this encounter.   Medication Changes: No orders of the defined types were placed in this encounter.   Follow Up:  Virtual Visit in 6 month(s)  Signed, Cecilie Kicks, NP  04/22/2019 11:47 AM    [place]

## 2019-04-22 NOTE — Telephone Encounter (Signed)
Had to contact the pt to switch her OV with Cecilie Kicks NP today, to a telephone virtual visit, due to c/o diarrhea, N/V, and fever. Pt is scheduled for a telephone visit now for today 8/21 at 1115. Pt aware of how this visit will work. Verbal consent obtained over the phone.         Virtual Visit Pre-Appointment Phone Call  "(Name), I am calling you today to discuss your upcoming appointment. We are currently trying to limit exposure to the virus that causes COVID-19 by seeing patients at home rather than in the office."  1. "What is the BEST phone number to call the day of the visit?" - include this in appointment notes-YES  2. Do you have or have access to (through a family member/friend) a smartphone with video capability that we can use for your visit?"  a. If no - list the appointment type as a PHONE visit in appointment notes-yes updated  3. Confirm consent - "In the setting of the current Covid19 crisis, you are scheduled for a (phone ) visit with your Cecilie Kicks NP for today 8/21 at 1115.  Just as we do with many in-office visits, in order for you to participate in this visit, we must obtain consent.  If you'd like, I can send this to your mychart (if signed up) or email for you to review.  Otherwise, I can obtain your verbal consent now.  All virtual visits are billed to your insurance company just like a normal visit would be.  By agreeing to a virtual visit, we'd like you to understand that the technology does not allow for your provider to perform an examination, and thus may limit your provider's ability to fully assess your condition. If your provider identifies any concerns that need to be evaluated in person, we will make arrangements to do so.  Finally, though the technology is pretty good, we cannot assure that it will always work on either your or our end, and in the setting of a video visit, we may have to convert it to a phone-only visit.  In either situation, we cannot  ensure that we have a secure connection.  Are you willing to proceed?" STAFF: Did the patient verbally acknowledge consent to telehealth visit? Document YES/NO here: YES PT GAVE VERBAL CONSENT  4. Advise patient to be prepared - "Two hours prior to your appointment, go ahead and check your blood pressure, pulse, oxygen saturation, and your weight (if you have the equipment to check those) and write them all down. When your visit starts, your provider will ask you for this information. If you have an Apple Watch or Kardia device, please plan to have heart rate information ready on the day of your appointment. Please have a pen and paper handy nearby the day of the visit as well."-PT DOES NOT [employer]antage   5. Inform patient they will receive a phone call 15 minutes prior to their appointment time (may be from unknown caller ID) so they should be prepared to [place] NOTE  [name] Case has been deemed a candidate for a follow-up tele-health visit to limit community exposure during the Covid-19 pandemic. I spoke with the patient via phone to ensure availability of phone/video source, confirm preferred email & phone number, and discuss instructions and expectations.  I reminded [name] Case to be prepared with any vital sign and/or heart rhythm information that could potentially be  obtained via home monitoring, at the time of her visit. I reminded [name] Case to expect a phone call prior to her visit.  [name] Alpha, LPN [id number] [id number]       FULL LENGTH CONSENT FOR TELE-HEALTH VISIT   I hereby voluntarily request, consent and authorize [facility] and its employed or contracted physicians, physician assistants, nurse practitioners or other licensed health care professionals (the Practitioner), to provide me with telemedicine health care services (the Services") as deemed necessary by the treating Practitioner. I acknowledge and consent to  receive the Services by the Practitioner via telemedicine. I understand that the telemedicine visit will involve communicating with the Practitioner through live audiovisual communication technology and the disclosure of certain medical information by electronic transmission. I acknowledge that I have been given the opportunity to request an in-person assessment or other available alternative prior to the telemedicine visit and am voluntarily participating in the telemedicine visit.  I understand that I have the right to withhold or withdraw my consent to the use of telemedicine in the course of my care at any time, without affecting my right to future care or treatment, and that the Practitioner or I may terminate the telemedicine visit at any time. I understand that I have the right to inspect all information obtained and/or recorded in the course of the telemedicine visit and may receive copies of available information for a reasonable fee.  I understand that some of the potential risks of receiving the Services via telemedicine include:   Delay or interruption in medical evaluation due to technological equipment failure or disruption;  Information transmitted may not be sufficient (e.g. poor resolution of images) to allow for appropriate medical decision making by the Practitioner; and/or   In rare instances, security protocols could fail, causing a breach of personal health information.  Furthermore, I acknowledge that it is my responsibility to provide information about my medical history, conditions and care that is complete and accurate to the best of my ability. I acknowledge that Practitioner's advice, recommendations, and/or decision may be based on factors not within their control, such as incomplete or inaccurate data provided by me or distortions of diagnostic images or specimens that may result from electronic transmissions. I understand that the practice of medicine is not an exact science  and that Practitioner makes no warranties or guarantees regarding treatment outcomes. I acknowledge that I will receive a copy of this consent concurrently upon execution via email to the email address I last provided but may also request a printed copy by calling the office of [place].    I understand that my insurance will be billed for this visit.   I have read or had this consent read to me.  I understand the contents of this consent, which adequately explains the benefits and risks of the Services being provided via telemedicine.   I have been provided ample opportunity to ask questions regarding this consent and the Services and have had my questions answered to my satisfaction.  I give my informed consent for the services to be provided through the use of telemedicine in my medical care  By participating in this telemedicine visit I agree to the above.  PT GAVE VERBAL CONSENT FOR LAURA INGOLD NP TO TREAT HER VIA TELEPHONE VISIT TODAY AT 1115.

## 2019-04-22 NOTE — Patient Instructions (Signed)
Medication Instructions:  Your physician recommends that you continue on your current medications as directed. Please refer to the Current Medication list given to you today.  If you need a refill on your cardiac medications before your next appointment, please call your pharmacy.     Follow-Up: At [facility], you and your health needs are our priority.  As part of our continuing mission to provide you with exceptional heart care, we have created designated Provider Care Teams.  These Care Teams include your primary Cardiologist (physician) and Advanced Practice Providers (APPs -  Physician Assistants and Nurse Practitioners) who all work together to provide you with the care you need, when you need it. You will need a follow up appointment in 6 months with Dr. Marlou Porch.  Please call our office 2 months in advance to schedule this appointment.  You may see  or one of the following Advanced Practice Providers on your designated Care Team:   Truitt Merle, NP Cecilie Kicks, NP . Kathyrn Drown, NP

## 2019-05-01 ENCOUNTER — Other Ambulatory Visit: Payer: Self-pay | Admitting: Internal Medicine

## 2019-05-03 DIAGNOSIS — I5041 Acute combined systolic (congestive) and diastolic (congestive) heart failure: Secondary | ICD-10-CM | POA: Diagnosis not present

## 2019-05-03 DIAGNOSIS — J441 Chronic obstructive pulmonary disease with (acute) exacerbation: Secondary | ICD-10-CM | POA: Diagnosis not present

## 2019-05-12 DIAGNOSIS — J449 Chronic obstructive pulmonary disease, unspecified: Secondary | ICD-10-CM | POA: Diagnosis not present

## 2019-05-18 ENCOUNTER — Telehealth: Payer: Self-pay | Admitting: *Deleted

## 2019-05-18 NOTE — Telephone Encounter (Signed)
Copied from [place] [id number]. Topic: General - Other >> May 18, 2019  9:41 AM Mathis Bud wrote: Reason for CRM: Patient insurance called patient asking why she needs oxygen all the time. Patient is requesting PCP to call back.  Call back [phone]

## 2019-05-18 NOTE — Telephone Encounter (Signed)
I called pt- she states [employer] or her insurance called her requesting to know why she needs O2 24/7. She stated she wasn't sure which.   Patient has diagnoses of: Chronic respiratory failure with hypoxia, COPD with acute exacerbation and Obstructive chronic bronchitis without exacerbation. I informed patient of this.   I called [employer] to f/u. I spoke to [place]. She states there are no notes on patient's account and everything is being paid by patient's insurance and they are not waiting for re certification from [place] at this time. Closing phone note.

## 2019-05-18 NOTE — Telephone Encounter (Signed)
I'm not sure what is requested from me.

## 2019-05-25 ENCOUNTER — Telehealth: Payer: Self-pay

## 2019-05-25 NOTE — Telephone Encounter (Signed)
Called patient states she does not know who it was besides her insurance or what it was they needed. Informed her if she can get back a hold of them and have them call us I can see exactly what they need. Patient stated understanding

## 2019-05-25 NOTE — Telephone Encounter (Signed)
Copied from [place] [id number]. Topic: General - Other >> May 25, 2019  9:33 AM Rainey Pines A wrote: Patient stated would like callback from Dr. Charlynne Cousins nurse in regards to insurance requesting documentation that she needs oxygen 24/7

## 2019-05-31 IMAGING — DX DG CHEST 2V
2 series · 2 of 2 positions shown · non-contrast
Comparison: August 25, 2018

CLINICAL DATA: Cough for 3 days

EXAM:
CHEST - 2 VIEW

[chest pa]
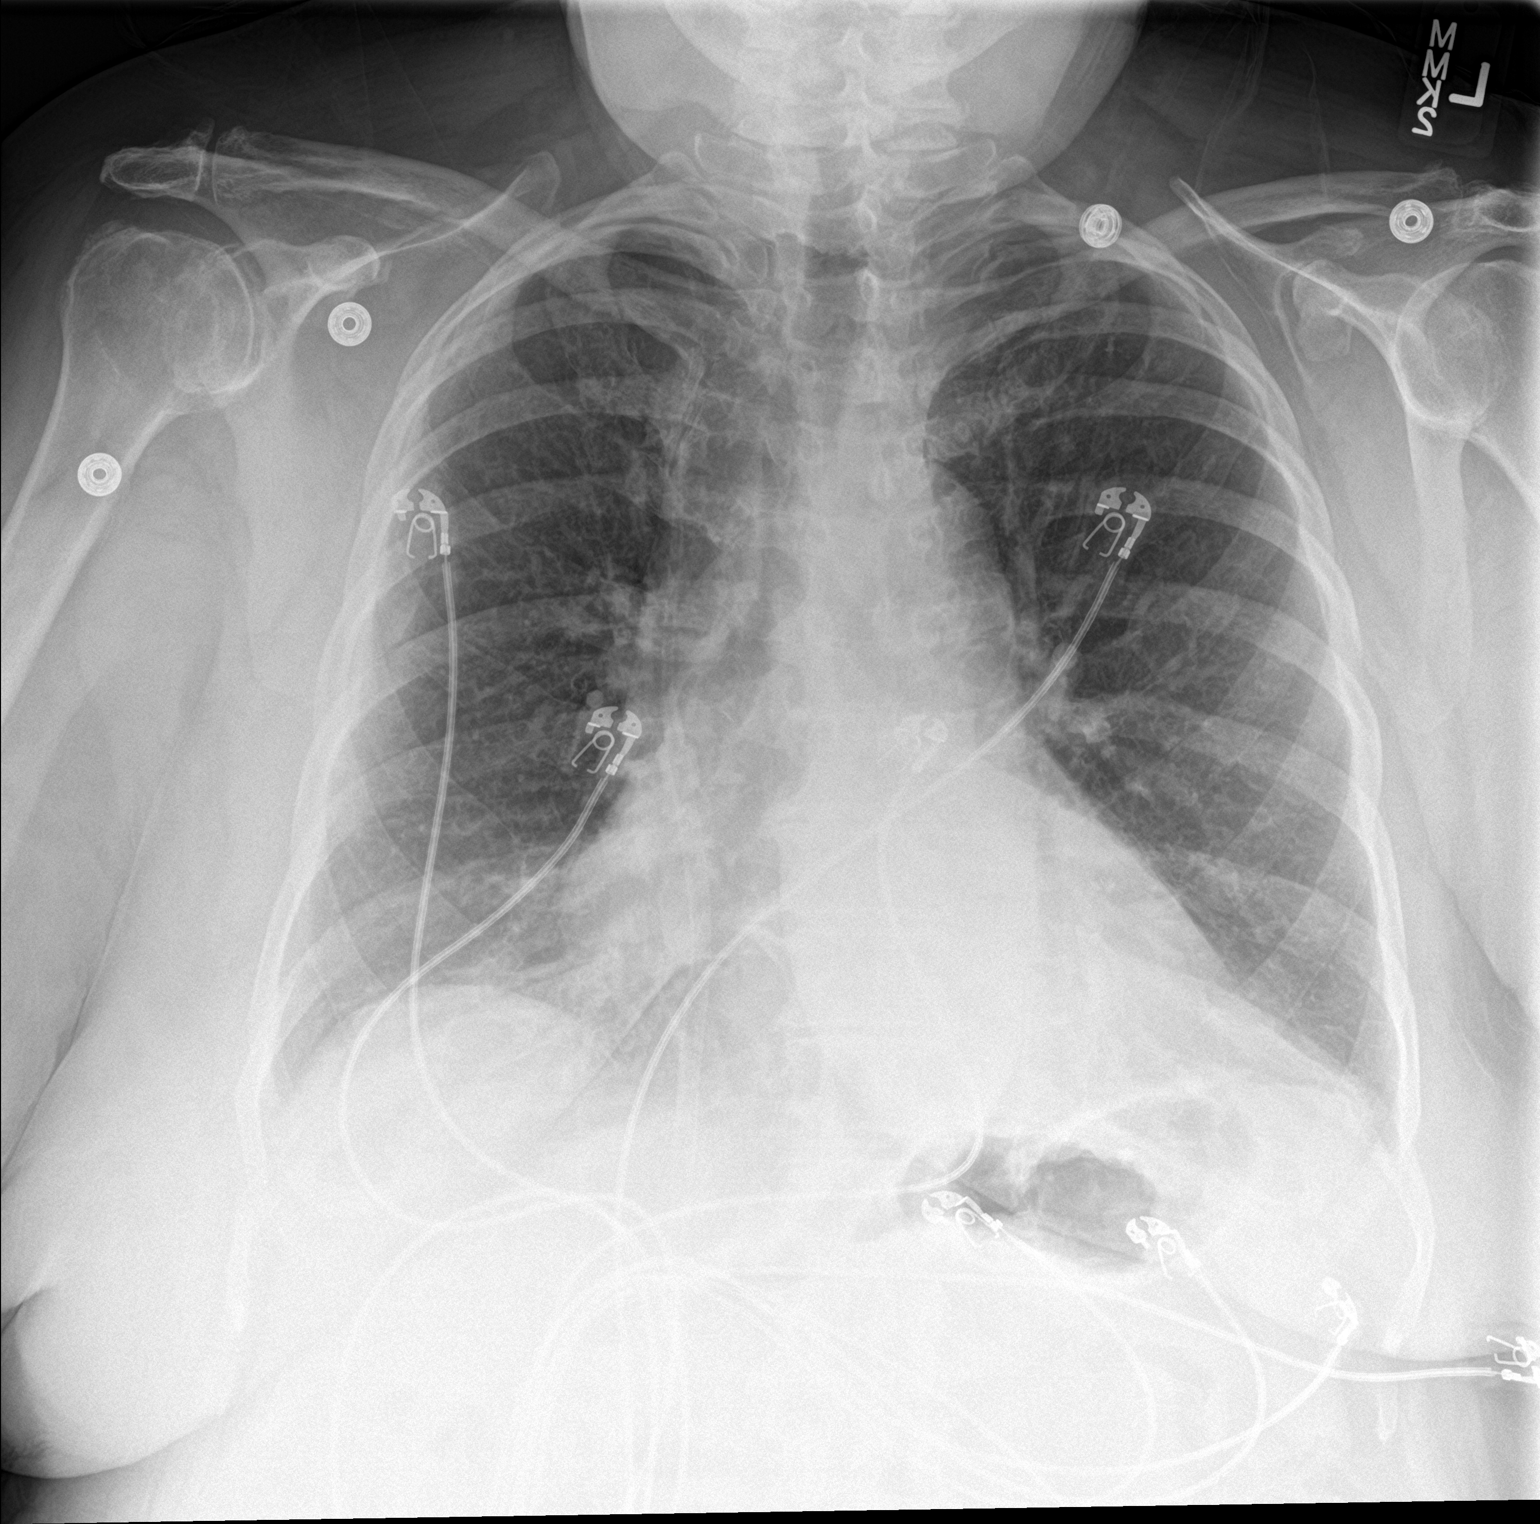

[chest lat]
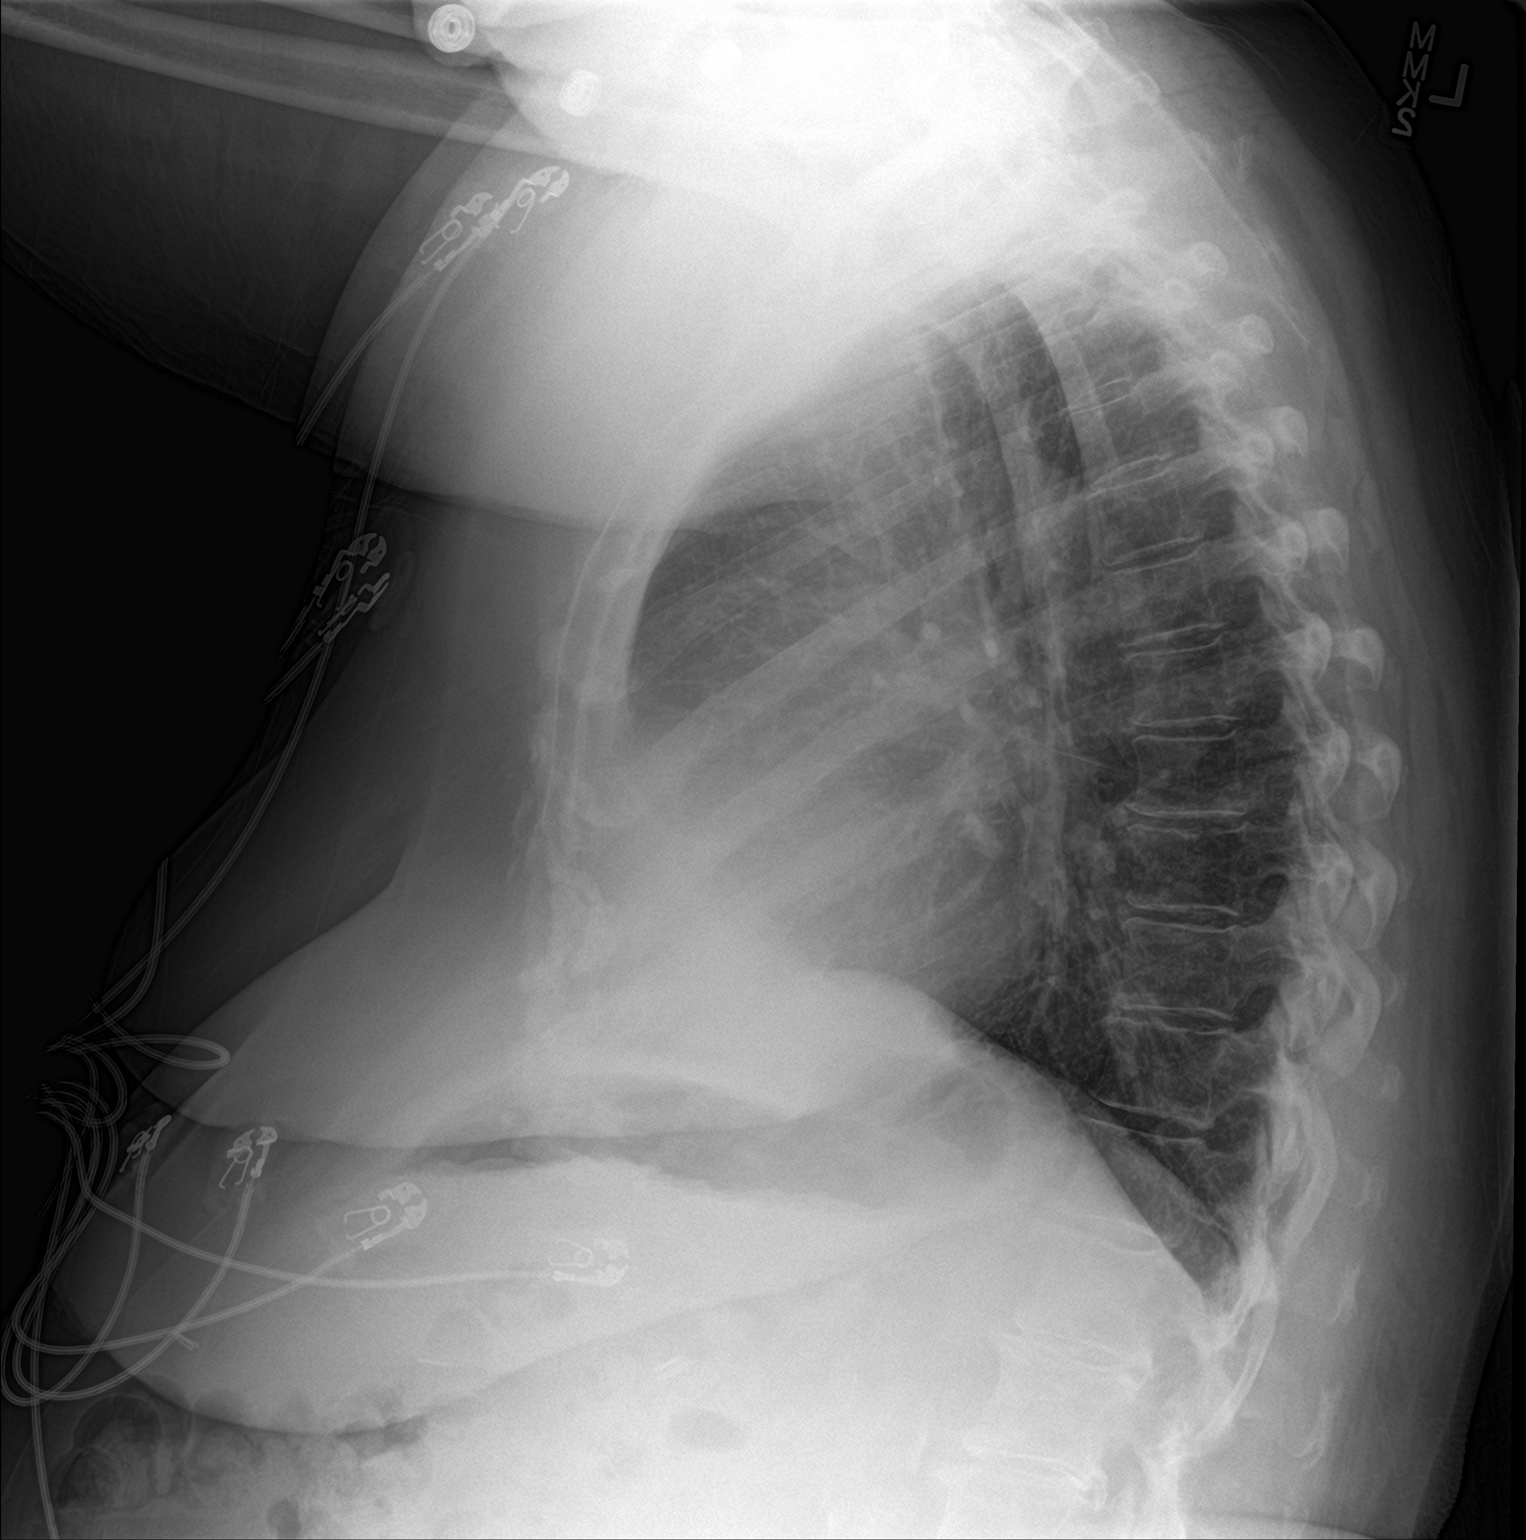

[2 of 2 positions shown; findings below may reference images not displayed]

FINDINGS: The heart size and mediastinal contours are stable. The heart size
is enlarged. There is no focal infiltrate, pulmonary edema, or
pleural effusion. The visualized skeletal structures are stable.
IMPRESSION: No active cardiopulmonary disease.

## 2019-06-02 DIAGNOSIS — I5041 Acute combined systolic (congestive) and diastolic (congestive) heart failure: Secondary | ICD-10-CM | POA: Diagnosis not present

## 2019-06-02 DIAGNOSIS — J441 Chronic obstructive pulmonary disease with (acute) exacerbation: Secondary | ICD-10-CM | POA: Diagnosis not present

## 2019-06-10 ENCOUNTER — Other Ambulatory Visit: Payer: Self-pay | Admitting: Internal Medicine

## 2019-06-11 DIAGNOSIS — J449 Chronic obstructive pulmonary disease, unspecified: Secondary | ICD-10-CM | POA: Diagnosis not present

## 2019-07-03 DIAGNOSIS — I5041 Acute combined systolic (congestive) and diastolic (congestive) heart failure: Secondary | ICD-10-CM | POA: Diagnosis not present

## 2019-07-03 DIAGNOSIS — J441 Chronic obstructive pulmonary disease with (acute) exacerbation: Secondary | ICD-10-CM | POA: Diagnosis not present

## 2019-07-04 ENCOUNTER — Encounter: Payer: Self-pay | Admitting: Internal Medicine

## 2019-07-04 ENCOUNTER — Ambulatory Visit (INDEPENDENT_AMBULATORY_CARE_PROVIDER_SITE_OTHER): Payer: Medicare Other | Admitting: Internal Medicine

## 2019-07-04 DIAGNOSIS — F4323 Adjustment disorder with mixed anxiety and depressed mood: Secondary | ICD-10-CM | POA: Diagnosis not present

## 2019-07-04 MED ORDER — MIRTAZAPINE 15 MG PO TABS: 15.0000 mg | ORAL_TABLET | Freq: Every day | ORAL | 1 refills | Status: DC

## 2019-07-04 NOTE — Assessment & Plan Note (Signed)
Rx mirtazepine as this helped in the past. She may need in person visit soon to assess.

## 2019-07-04 NOTE — Progress Notes (Signed)
Virtual Visit via Video Note  I connected with [name] Case on 07/04/19 at  1:00 PM EST by a video enabled telemedicine application and verified that I am speaking with the correct person using two identifiers.  The patient and the provider were at separate locations throughout the entire encounter.   I discussed the limitations of evaluation and management by telemedicine and the availability of in person appointments. The patient expressed understanding and agreed to proceed. The patient and the provider were the only parties present for the visit unless noted in HPI below.  History of Present Illness: The patient is a 85 y.o. female with visit for sleeping problems. Started a long time ago. She is not sure if this is worsening lately. Gets to sleep around 5-6AM. Is sleeping during the day also. Has problems with getting to sleep and with waking up from sleep. Previously was on remeron for sleep and this did seem to help some. Has no SI/HI.  Observations/Objective: Appearance: normal, breathing appears normal, oxygen tubing not in nose, casual grooming, abdomen does not appear distended, memory not great, she asks granddaughter who is off video several times what this call is about, mental status is awake and oriented times 2  Assessment and Plan: See problem oriented charting  Follow Up Instructions: rx mirtazepine  I discussed the assessment and treatment plan with the patient. The patient was provided an opportunity to ask questions and all were answered. The patient agreed with the plan and demonstrated an understanding of the instructions.   The patient was advised to call back or seek an in-person evaluation if the symptoms worsen or if the condition fails to improve as anticipated.  Hoyt Koch, MD

## 2019-07-12 DIAGNOSIS — J449 Chronic obstructive pulmonary disease, unspecified: Secondary | ICD-10-CM | POA: Diagnosis not present

## 2019-07-16 ENCOUNTER — Other Ambulatory Visit: Payer: Self-pay | Admitting: Internal Medicine

## 2019-07-30 ENCOUNTER — Other Ambulatory Visit: Payer: Self-pay

## 2019-07-30 ENCOUNTER — Emergency Department (HOSPITAL_COMMUNITY)
Admission: EM | Admit: 2019-07-30 | Discharge: 2019-07-31 | Disposition: A | Discharge status: Home / Self Care | Payer: Medicare Other | Attending: Emergency Medicine | Admitting: Emergency Medicine

## 2019-07-30 DIAGNOSIS — Z743 Need for continuous supervision: Secondary | ICD-10-CM | POA: Diagnosis not present

## 2019-07-30 DIAGNOSIS — R319 Hematuria, unspecified: Secondary | ICD-10-CM | POA: Diagnosis not present

## 2019-07-30 DIAGNOSIS — I959 Hypotension, unspecified: Secondary | ICD-10-CM | POA: Diagnosis not present

## 2019-07-30 DIAGNOSIS — R58 Hemorrhage, not elsewhere classified: Secondary | ICD-10-CM | POA: Diagnosis not present

## 2019-07-30 DIAGNOSIS — R531 Weakness: Secondary | ICD-10-CM | POA: Diagnosis not present

## 2019-07-30 DIAGNOSIS — Z5321 Procedure and treatment not carried out due to patient leaving prior to being seen by health care provider: Secondary | ICD-10-CM | POA: Diagnosis not present

## 2019-07-30 NOTE — ED Notes (Signed)
Granddaughter [name] Case called and wants to be back with her grandmother. Nurse told to call granddaughter.

## 2019-07-30 NOTE — ED Notes (Signed)
Pts daughter at bedside with pt and was speaking with pt and this nurse about pts wishes. Pts daughter wants there to be "papers signed and documented" about pts wishes. This nurse notified pts daughter that typically that is something that is done with pts primary care physician but that a nurse can't do it the doctor tonight will have to. Pts daughter was almost tearful stating that "it is God's will" and that her mom is "being called home and needs to be able to go home" and this nurse tried to explain to pts daughter that her mother is stable at this time and it isn't and immediate concern but her daughter insisted that her mother is dying.

## 2019-07-30 NOTE — ED Triage Notes (Signed)
Pt presents to ED via [facility] coming from home. Pt co hematuria for several days but very worse today. Pt denies any pain or other urinary symptoms just has some generalized weakness as well. 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 Pt has no cough, fever, ShOB and is O2 at home normally for CHF.

## 2019-07-31 NOTE — ED Notes (Signed)
Granddaughter was told that patient is being dressed to be wheeled out to granddaughters car. Granddaughter states ok.

## 2019-07-31 NOTE — ED Notes (Signed)
Pt called out requesting to be dressed and taken home. States her granddaughter [name] will come and get her to take her home. Grandville Silos, RN aware.

## 2019-07-31 NOTE — ED Notes (Addendum)
Pts granddaughter, [name] Case, showed up at the ED stating she was here to to pt home, pt also wants to go home without being seen. Pt is CA&Ox4 and pt and pts granddaughter both made aware of they were leaving without medical evaluation and they understood. Pt states her granddaughter, [name] Case, is her POA and is allowed to take her home and she wants to go home now. Pt assisted to wheelchair and wheeled to lobby to leave with granddaugher without incident. EDP made aware.

## 2019-08-02 DIAGNOSIS — I5041 Acute combined systolic (congestive) and diastolic (congestive) heart failure: Secondary | ICD-10-CM | POA: Diagnosis not present

## 2019-08-02 DIAGNOSIS — J441 Chronic obstructive pulmonary disease with (acute) exacerbation: Secondary | ICD-10-CM | POA: Diagnosis not present

## 2019-08-03 ENCOUNTER — Ambulatory Visit: Payer: Medicare Other | Admitting: Family

## 2019-08-11 DIAGNOSIS — J449 Chronic obstructive pulmonary disease, unspecified: Secondary | ICD-10-CM | POA: Diagnosis not present

## 2019-09-02 DIAGNOSIS — J441 Chronic obstructive pulmonary disease with (acute) exacerbation: Secondary | ICD-10-CM | POA: Diagnosis not present

## 2019-09-02 DIAGNOSIS — I5041 Acute combined systolic (congestive) and diastolic (congestive) heart failure: Secondary | ICD-10-CM | POA: Diagnosis not present

## 2019-09-11 DIAGNOSIS — J449 Chronic obstructive pulmonary disease, unspecified: Secondary | ICD-10-CM | POA: Diagnosis not present

## 2019-09-14 ENCOUNTER — Other Ambulatory Visit: Payer: Self-pay | Admitting: Internal Medicine

## 2019-09-23 ENCOUNTER — Telehealth: Payer: Self-pay | Admitting: Internal Medicine

## 2019-09-23 ENCOUNTER — Other Ambulatory Visit: Payer: Self-pay | Admitting: Internal Medicine

## 2019-09-23 NOTE — Telephone Encounter (Signed)
   Patient calling to report vaginal itching for 1 week. Requesting RX be called to pharmacy   Please advise

## 2019-09-26 MED ORDER — FLUCONAZOLE 150 MG PO TABS: 150.0000 mg | ORAL_TABLET | Freq: Once | ORAL | 0 refills | Status: AC

## 2019-09-26 NOTE — Addendum Note (Signed)
Addended by: Pricilla Holm A on: 09/26/2019 07:56 AM   Modules accepted: Orders

## 2019-09-26 NOTE — Telephone Encounter (Signed)
Sent in diflucan to her pharmacy.

## 2019-10-03 DIAGNOSIS — I5041 Acute combined systolic (congestive) and diastolic (congestive) heart failure: Secondary | ICD-10-CM | POA: Diagnosis not present

## 2019-10-03 DIAGNOSIS — J441 Chronic obstructive pulmonary disease with (acute) exacerbation: Secondary | ICD-10-CM | POA: Diagnosis not present

## 2019-10-07 ENCOUNTER — Telehealth: Payer: Self-pay | Admitting: Internal Medicine

## 2019-10-07 MED ORDER — FLUCONAZOLE 150 MG PO TABS: 150.0000 mg | ORAL_TABLET | ORAL | 0 refills | Status: DC

## 2019-10-07 NOTE — Telephone Encounter (Signed)
Have sent in diflucan to take every 3 days for 2 weeks. If recurrence or no resolution needs to see ob/gyn.

## 2019-10-07 NOTE — Addendum Note (Signed)
Addended by: Pricilla Holm A on: 10/07/2019 02:22 PM   Modules accepted: Orders

## 2019-10-07 NOTE — Telephone Encounter (Signed)
Patient states she has a bad yeast infection. She is requesting to have an RX sent into her pharmacy. She requested an appointment.   Please advise or follow up with patient. Thank you.

## 2019-10-07 NOTE — Telephone Encounter (Signed)
I see that she also called in about this on 09/23/19. Did the diflucan called in then help? She does also have lichen planus which can cause itching vaginally rather than yeast infection.

## 2019-10-07 NOTE — Telephone Encounter (Signed)
Pt has been informed and expressed understanding.  

## 2019-10-07 NOTE — Telephone Encounter (Signed)
Pt stated that the diflucan did help when she was taking it but she stated that she has another yeast infection and would like something sent in. Please advise.

## 2019-10-12 DIAGNOSIS — J449 Chronic obstructive pulmonary disease, unspecified: Secondary | ICD-10-CM | POA: Diagnosis not present

## 2019-10-13 ENCOUNTER — Other Ambulatory Visit: Payer: Self-pay | Admitting: Internal Medicine

## 2019-10-23 ENCOUNTER — Other Ambulatory Visit: Payer: Self-pay | Admitting: Internal Medicine

## 2019-10-31 DIAGNOSIS — J441 Chronic obstructive pulmonary disease with (acute) exacerbation: Secondary | ICD-10-CM | POA: Diagnosis not present

## 2019-10-31 DIAGNOSIS — I5041 Acute combined systolic (congestive) and diastolic (congestive) heart failure: Secondary | ICD-10-CM | POA: Diagnosis not present

## 2019-11-04 ENCOUNTER — Telehealth: Payer: Self-pay

## 2019-11-04 NOTE — Telephone Encounter (Signed)
CMS form from [employer] for oxygen has been received,completed, and faxed back.  Original sent to scan.

## 2019-11-08 ENCOUNTER — Telehealth: Payer: Self-pay | Admitting: Internal Medicine

## 2019-11-08 NOTE — Telephone Encounter (Signed)
Pt stated that she does not have a OBGYN and has made an OV for tomorrow.

## 2019-11-08 NOTE — Telephone Encounter (Signed)
She has already been treated without visit and needs to see ob/gyn. She does have lichen planus which could be causing the issue rather than a yeast infection.

## 2019-11-08 NOTE — Telephone Encounter (Signed)
    Patient calling to request medication for vaginal yeast infection. Patient states she has had the issue for weeks and has been using OTC medication. Patient declined appointment.

## 2019-11-09 ENCOUNTER — Ambulatory Visit: Payer: Medicare Other | Admitting: Family Medicine

## 2019-11-09 ENCOUNTER — Ambulatory Visit (INDEPENDENT_AMBULATORY_CARE_PROVIDER_SITE_OTHER): Payer: Medicare Other | Admitting: Internal Medicine

## 2019-11-09 ENCOUNTER — Encounter: Payer: Self-pay | Admitting: Internal Medicine

## 2019-11-09 ENCOUNTER — Encounter: Payer: Self-pay | Admitting: Family Medicine

## 2019-11-09 ENCOUNTER — Other Ambulatory Visit: Payer: Self-pay

## 2019-11-09 DIAGNOSIS — J449 Chronic obstructive pulmonary disease, unspecified: Secondary | ICD-10-CM | POA: Diagnosis not present

## 2019-11-09 DIAGNOSIS — L439 Lichen planus, unspecified: Secondary | ICD-10-CM

## 2019-11-09 DIAGNOSIS — M1711 Unilateral primary osteoarthritis, right knee: Secondary | ICD-10-CM | POA: Diagnosis not present

## 2019-11-09 MED ORDER — AMBULATORY NON FORMULARY MEDICATION: 0 refills | Status: DC

## 2019-11-09 MED ORDER — FLUCONAZOLE 150 MG PO TABS: 150.0000 mg | ORAL_TABLET | ORAL | 0 refills | Status: DC

## 2019-11-09 MED ORDER — TRIAMCINOLONE ACETONIDE 0.1 % EX CREA: 1.0000 "application " | TOPICAL_CREAM | Freq: Two times a day (BID) | CUTANEOUS | 11 refills | Status: DC

## 2019-11-09 NOTE — Patient Instructions (Signed)
We will send in the diflucan to take 1 pill every 3 days for 1 week.   We have also sent in the cream to use twice a day on the area to help calm this down. Use this twice a day for at least 6 months and then we can adjust if needed.   If this doesn't help we will need to get you in with a gyn.   Lichen Planus Lichen planus is a skin problem that causes redness, itching, small bumps, and sores. It can affect the skin in any area of the body. Some common areas affected include:  Arms, wrists, legs, or ankles.  Chest, back, or abdomen.  Genital areas such as the vulva and vagina.  Gums and inside of the mouth.  Scalp.  Fingernails or toenails. Treatment can help control the symptoms of this condition. The condition can last for a long time. It can take 6-18 months or longer for it to go away. What are the causes? The exact cause of this condition is not known. The condition is not passed from one person to another (not contagious). It may be related to an allergy, a medicine, or an autoimmune response. An autoimmune response occurs when the body's defense system (immune system) mistakenly attacks healthy tissues. What increases the risk? The following factors may make you more likely to develop this condition:  Being older than 84 years of age.  Taking certain medicines.  Having been exposed to certain dyes or chemicals.  Having hepatitis C.  Being a woman. What are the signs or symptoms? Symptoms of this condition may include:  Itching, which can be severe.  Small reddish or purple bumps on the skin. These may have flat tops and may be round or irregular shaped.  Redness or white patches on the gums or tongue.  Redness, soreness, or a burning feeling in the genital area. This may lead to pain or bleeding during sex.  Changes in the fingernails or toenails. The nails may become thin or rough. They may have ridges in them.  Redness or irritation of the eyes. This is  rare. How is this diagnosed? This condition may be diagnosed based on:  A physical exam. The health care provider will examine your affected skin and check for changes inside your mouth.  Removal of a tissue sample (biopsy sample) to be looked at under a microscope. How is this treated? Treatment for this condition may depend on the severity of symptoms. In some cases, no treatment is needed. If treatment is needed to control symptoms, it may include:  Creams or ointments (topical steroids) to help control itching and irritation.  Medicine to be taken by mouth.  Medicine to be taken by injection.  A treatment in which your skin is exposed to ultraviolet light (phototherapy).  Lozenges that you suck on to help treat sores in the mouth. Follow these instructions at home:   Take or use over-the-counter and prescription medicines only as told by your health care provider.  Use creams or ointments as told by your health care provider.  Do not scratch the affected areas of skin.  If you are a woman, be sure to keep the vaginal area as clean and dry as possible.  If you have sores in your mouth, avoid spicy and acidic foods as well as alcohol and tobacco.  Keep all follow-up visits as told by your health care provider. This is important. Contact a health care provider if:  You have  increasing redness, swelling, or pain in the affected area.  You have fluid, blood, or pus coming from the affected area.  Your eyes become irritated. Summary  Lichen planus is a skin problem that causes redness, itching, small bumps, and sores. It can affect the skin in any area of the body.  Do not scratch the affected areas of skin. Keep the affected area of skin clean.  Take or use over-the-counter and prescription medicines only as told by your health care provider.  Contact a health care provider if you have drainage from the affected area or have increasing redness, swelling, or pain in the  area.  Keep all follow-up visits as told by your health care provider. This is important. This information is not intended to replace advice given to you by your health care provider. Make sure you discuss any questions you have with your health care provider. Document Revised: 12/30/2017 Document Reviewed: 12/30/2017 Elsevier Patient Education  [place].

## 2019-11-09 NOTE — Progress Notes (Signed)
   Subjective:   Patient ID: [name] Case, female    DOB: [birth year], 86 y.o.   MRN: [id number]  HPI The patient is an 86 YO female coming in for concerns about vaginal symptoms. She has had likely lichen planus/yeast infection many times in the last year. Since the beginning of the year she has been treated twice empirically for yeast infection with diflucan. She most recently took 2 week course of diflucan beginning of February and this did help for a few days but not long. She is having itching, pain, redness. No discharge. She has been advised to use triamcinolone ointment BID on affected area and is not still doing that due to running out of this.   Review of Systems  Constitutional: Negative.   HENT: Negative.   Eyes: Negative.   Respiratory: Negative for cough, chest tightness and shortness of breath.   Cardiovascular: Negative for chest pain, palpitations and leg swelling.  Gastrointestinal: Negative for abdominal distention, abdominal pain, constipation, diarrhea, nausea and vomiting.  Genitourinary: Positive for pelvic pain and vaginal pain.       Vaginal itching and redness  Musculoskeletal: Negative.   Skin: Positive for rash.  Neurological: Negative.   Psychiatric/Behavioral: Negative.     Objective:  Physical Exam Constitutional:      Appearance: She is well-developed.  HENT:     Head: Normocephalic and atraumatic.  Cardiovascular:     Rate and Rhythm: Normal rate and regular rhythm.  Pulmonary:     Effort: Pulmonary effort is normal. No respiratory distress.     Breath sounds: Normal breath sounds. No wheezing or rales.  Abdominal:     General: Bowel sounds are normal. There is no distension.     Palpations: Abdomen is soft.     Tenderness: There is no abdominal tenderness. There is no rebound.  Genitourinary:    Comments: Exam deferred today due to mobility issues Musculoskeletal:     Cervical back: Normal range of motion.  Skin:    General: Skin is warm  and dry.  Neurological:     Mental Status: She is alert and oriented to person, place, and time.     Coordination: Coordination normal.     Vitals:   11/09/19 1016  BP: 132/84  Pulse: 78  Temp: 98.2 F (36.8 C)  TempSrc: Oral  SpO2: 96%  Weight: 225 lb (102.1 kg)  Height: 5' (1.524 m)    This visit occurred during the SARS-CoV-2 public health emergency.  Safety protocols were in place, including screening questions prior to the visit, additional usage of staff PPE, and extensive cleaning of exam room while observing appropriate contact time as indicated for disinfecting solutions.   Assessment & Plan:  Visit time 20 minutes in face to face communication with patient and coordination of care, additional 10 minutes spent in record review, coordination or care, ordering tests, communicating/referring to other healthcare professionals, documenting in medical records all on the same day of the visit for total time 30 minutes spent on the visit.

## 2019-11-09 NOTE — Patient Instructions (Signed)
Good to see you.  Ice 20 minutes 2 times daily. Usually after activity and before bed. Tart cherry extract 1200mg  at night Vitamin D 2000 IU daily  See me again in 6 weeks

## 2019-11-09 NOTE — Assessment & Plan Note (Signed)
Will treat with triamcinolone ointment BID with refills sent in. Advised that if this does not help she will need to see gyn. Rx diflucan 1 week supply for decolonization to help.

## 2019-11-09 NOTE — Progress Notes (Signed)
[place] [address] [place] Phone: [phone] Subjective:   I [name] Case am serving as a Education administrator for Dr. Hulan Saas.  This visit occurred during the SARS-CoV-2 public health emergency.  Safety protocols were in place, including screening questions prior to the visit, additional usage of staff PPE, and extensive cleaning of exam room while observing appropriate contact time as indicated for disinfecting solutions.   I'm seeing this patient by the request  of:  Hoyt Koch, MD  CC: Right knee pain  [id number]  [name] Case is a 86 y.o. female coming in with complaint of right knee swelling and pain. Arthritis. Would like injection. Lateral knee pain.  Patient is accompanied with her granddaughter.  Onset- Chronic   Character- achy  Aggravating factors- walking   Severity-  9/10 at its worse    Patient did have x-rays in 2013 they were independently visualized by me showing the patient had near bone-on-bone osteoarthritic changes of the medial and patellofemoral  Past Medical History:  Diagnosis Date  . Allergy   . Arthritis   . Atrial fibrillation ([place])   . Bronchitis   . Cancer ([place])    Bilateral upper tract transitional cell carcinoma  . Cardiomegaly   . CHF (congestive heart failure) ([place])   . Complication of anesthesia    Difficult to arouse  . COPD (chronic obstructive pulmonary disease) ([facility])    borderline   pt. denies at preop  . Diabetes mellitus without complication ([place])    Controlled by diet and exercise  . Diverticulosis of sigmoid colon   . Dyspnea    at times  . Dysrhythmia    a fib  . Family history of bladder cancer   . Family history of colon cancer   . Family history of kidney cancer   . Fatty liver   . GERD (gastroesophageal reflux disease)   . History of hiatal hernia 01/20/2015   Small sliding, noted on CT  . History of kidney stones   . Hyperlipidemia   .  Hypertension   . Hypothyroidism   . Lynch syndrome   . PONV (postoperative nausea and vomiting)   . Supraumbilical hernia 0000000   Small noted on CT  . Thyroid disease   . UTI (urinary tract infection)    Past Surgical History:  Procedure Laterality Date  . ABDOMINAL HYSTERECTOMY    . APPENDECTOMY    . CHOLECYSTECTOMY    . COLONOSCOPY  07/16/2009  . CYSTOSCOPY W/ URETERAL STENT PLACEMENT Right 12/04/2017   Procedure: CYSTOSCOPY WITH RETROGRADE PYELOGRAM/URETERAL STENT PLACEMENT;  Surgeon: Festus Aloe, MD;  Location: [facility];  Service: Urology;  Laterality: Right;  . CYSTOSCOPY W/ URETERAL STENT PLACEMENT Right 02/26/2018   Procedure: CYSTOSCOPY WITH RETROGRADE PYELOGRAM/URETERAL STENT PLACEMENT;  Surgeon: Kathie Rhodes, MD;  Location: [facility];  Service: Urology;  Laterality: Right;  . CYSTOSCOPY WITH URETEROSCOPY AND STENT PLACEMENT Bilateral 01/29/2018   Procedure: BILATERAL URETEROSCOPY AND TUMOR RESECTION  WITH LASER RIGHT STENT EXCHANGE LEFT STENT PLACEMENT;  Surgeon: Ardis Hughs, MD;  Location: [facility];  Service: Urology;  Laterality: Bilateral;  . CYSTOSCOPY WITH URETEROSCOPY AND STENT PLACEMENT Bilateral 02/10/2018   Procedure: BILATERAL URETEROSCOPY AND TUMOR EXCISION BILATERAL STENT EXCHANGE;  Surgeon: Ardis Hughs, MD;  Location: [facility];  Service: Urology;  Laterality: Bilateral;  . CYSTOSCOPY/RETROGRADE/URETEROSCOPY Bilateral 12/24/2017   Procedure: BILATERAL URETEROSCOPY, RIGHT STENT EXCHANGE, STONE EXTRACTION WITH BASKET BILATERAL RETROGRADE PYELOGRAM, BIOPSY OF RIGHT UPPER  POLE TUMOR,  BILATERAL RENAL PELVIS FLUID SENT FOR CYTOLOGY;  Surgeon: Ardis Hughs, MD;  Location: [facility];  Service: Urology;  Laterality: Bilateral;  . CYSTOSCOPY/URETEROSCOPY/HOLMIUM LASER/STENT PLACEMENT Bilateral 06/03/2018   Procedure: CYSTOSCOPY/BILATERAL URETEROSCOPY/ LASER TUMOR ABLATION BILATERAL / BILATERAL STENT PLACEMENT/BILATERAL RETROGRADE PYELOGRAM/BLADDER BIOPSY WITH  FULGURATION;  Surgeon: Ardis Hughs, MD;  Location: [facility];  Service: Urology;  Laterality: Bilateral;  . HOLMIUM LASER APPLICATION Bilateral [id number]   Procedure: HOLMIUM LASER APPLICATION;  Surgeon: Ardis Hughs, MD;  Location: [facility];  Service: Urology;  Laterality: Bilateral;  . KNEE SURGERY Right   . KNEE SURGERY Left    torn ligament repair  . UPPER GI ENDOSCOPY    . ureteroscopy laser tumor ablation bilateral stent placement     Dr. Louis Meckel 06-03-18    Social History   Socioeconomic History  . Marital status: Widowed    Spouse name: Not on file  . Number of children: Not on file  . Years of education: Not on file  . Highest education level: Not on file  Occupational History  . Occupation: retired  Tobacco Use  . Smoking status: Never Smoker  . Smokeless tobacco: Never Used  Substance and Sexual Activity  . Alcohol use: No    Alcohol/week: 0.0 standard drinks  . Drug use: No  . Sexual activity: Not Currently    Birth control/protection: Surgical  Other Topics Concern  . Not on file  Social History Narrative  . Not on file   Social Determinants of Health   Financial Resource Strain: Low Risk   . Difficulty of Paying Living Expenses: Not hard at all  Food Insecurity: No Food Insecurity  . Worried About Charity fundraiser in the Last Year: Never true  . Ran Out of Food in the Last Year: Never true  Transportation Needs: No Transportation Needs  . Lack of Transportation (Medical): No  . Lack of Transportation (Non-Medical): No  Physical Activity: Inactive  . Days of Exercise per Week: 0 days  . Minutes of Exercise per Session: 0 min  Stress: No Stress Concern Present  . Feeling of Stress : Not at all  Social Connections: Unknown  . Frequency of Communication with Friends and Family: More than three times a week  . Frequency of Social Gatherings with Friends and Family: More than three times a week  . Attends Religious Services: 1 to 4 times per  year  . Active Member of Clubs or Organizations: Not on file  . Attends Archivist Meetings: Not on file  . Marital Status: Not on file   Allergies  Allergen Reactions  . Codeine Hives and Rash    In cough syrup preparations   Family History  Problem Relation Age of Onset  . Heart disease Mother   . Colon cancer Mother 49       d. 27  . Tuberculosis Father   . Bladder Cancer Brother        urothelial cancer  . Bladder Cancer Brother        ureter cancer  . Kidney cancer Brother   . Skin cancer Sister   . Cancer Maternal Aunt        NOS  . Colon cancer Other        dx 1st time under 43, second time at 34  . Lung cancer Other   . Muscular dystrophy Other        d. 56  . Testicular cancer Other 24  great nephew  . Alcohol abuse Daughter   . Drug abuse Daughter   . Schizophrenia Daughter   . Colon cancer Grandchild     Current Outpatient Medications (Endocrine & Metabolic):  .  levothyroxine (SYNTHROID) 100 MCG tablet, TAKE 1 TABLET BY MOUTH EVERY DAY  Current Outpatient Medications (Cardiovascular):  .  atorvastatin (LIPITOR) 20 MG tablet, TAKE 1 TABLET BY MOUTH DAILY AT 6 PM .  diltiazem (CARDIZEM CD) 180 MG 24 hr capsule, TAKE 1 CAPSULE BY MOUTH EVERY DAY .  irbesartan (AVAPRO) 150 MG tablet, TAKE 1/2 TABLET BY MOUTH EVERY DAY .  metoprolol succinate (TOPROL-XL) 50 MG 24 hr tablet, TAKE 1 TABLET BY MOUTH DAILY WITH OR IMMEDIATELY FOLLOWING A MEAL. .  furosemide (LASIX) 40 MG tablet, Take 2 tablets (80 mg total) by mouth daily.  Current Outpatient Medications (Respiratory):  .  ipratropium (ATROVENT) 0.02 % nebulizer solution, Take 5 mLs (1 mg total) by nebulization every 6 (six) hours as needed for wheezing or shortness of breath.  Current Outpatient Medications (Analgesics):  .  acetaminophen (TYLENOL) 500 MG tablet, Take 500-1,000 mg by mouth every 8 (eight) hours as needed for moderate pain or headache.   Current Outpatient Medications  (Hematological):  [name]  ELIQUIS 5 MG TABS tablet, TAKE 1 TABLET BY MOUTH TWICE A DAY  Current Outpatient Medications (Other):  .  fluconazole (DIFLUCAN) 150 MG tablet, Take 1 tablet (150 mg total) by mouth every 3 (three) days. [name]  KLOR-CON M20 20 MEQ tablet, TAKE 1 TABLET BY MOUTH EVERY DAY .  mirtazapine (REMERON) 15 MG tablet, Take 1 tablet (15 mg total) by mouth at bedtime. .  ondansetron (ZOFRAN) 4 MG tablet, Take 1 tablet (4 mg total) by mouth every 8 (eight) hours as needed for up to 15 doses for nausea or vomiting. .  triamcinolone cream (KENALOG) 0.1 %, Apply 1 application topically 2 (two) times daily. [name]  trimethoprim-polymyxin b (POLYTRIM) ophthalmic solution, Place 1 drop into the right eye every 6 (six) hours. .  AMBULATORY NON FORMULARY MEDICATION, Rolator   Reviewed prior external information including notes and imaging from  primary care provider As well as notes that were available from care everywhere and other healthcare systems.  Past medical history, social, surgical and family history all reviewed in electronic medical record.  No pertanent information unless stated regarding to the chief complaint.   Review of Systems:  No headache, visual changes, nausea, vomiting, diarrhea, constipation, dizziness, abdominal pain, skin rash, fevers, chills, night sweats, weight loss, swollen lymph nodes, , chest pain, shortness of breath, mood changes. POSITIVE muscle aches, body aches, joint swelling  Objective  Blood pressure 118/70, pulse 71, height 5' (1.524 m), weight 225 lb (102.1 kg), SpO2 98 %.   General: Appears very uncomfortable HEENT: Pupils equal, extraocular movements intact  Respiratory: Patient's speak in full sentences and does not appear short of breath  Cardiovascular: 1+ lower extremity edema, non tender, no erythema significant varicose veins noted Skin: Warm dry intact with no signs of infection or rash on extremities or on axial skeleton.  Abdomen: Soft  nontender  Neuro: Cranial nerves II through XII are intact, neurovascularly intact in all extremities with 2+ DTRs and 2+ pulses.  Gait severely antalgic gait using the aid of a walker and cane MSK:   Knee: Right valgus deformity noted. Large thigh to calf ratio.  Mild effusion noted Tender to palpation over medial and PF joint line.  Limited range of motion lacking the last 10  degrees of extension in the last 20 degrees of flexion instability with valgus and varus force.  painful patellar compression. Patellar glide with moderate crepitus. Patellar and quadriceps tendons unremarkable. Hamstring and quadriceps strength is normal. Contralateral knee has some arthritic changes as well.  Limited musculoskeletal ultrasound was performed and interpreted by .me  Limited ultrasound of patient's knee shows severe narrowing of the medial joint space moderate of the lateral severe of the patellofemoral.  Chronic effusion noted but small overall.  After informed written and verbal consent, patient was seated on exam table. Right knee was prepped with alcohol swab and utilizing anterolateral approach, patient's right knee space was injected with 4:1  marcaine 0.5%: Kenalog 40mg /dL. Patient tolerated the procedure well without immediate complications.   Impression and Recommendations:     This case required medical decision making of moderate complexity. The above documentation has been reviewed and is accurate and complete Lyndal Pulley, DO       Note: This dictation was prepared with Dragon dictation along with smaller phrase technology. Any transcriptional errors that result from this process are unintentional.

## 2019-11-09 NOTE — Assessment & Plan Note (Addendum)
Right knee was injected today.  Tolerated the procedure well.  Could be a candidate for viscosupplementation.  Due to patient's age and social determinants of health difficulty with transportation patient is not a surgical candidate.  Patient wants to avoid any surgery which I agree with anyhow.  We discussed with patient about home exercise, icing regimen, which activities to do which wants to avoid.  Patient will follow up with me again in 6 weeks.  Patient written for a better walker.  Do not feel the patient would be compliant with a custom brace even though patient does have an abnormal thigh to calf ratio as well as instability of the knee and patient declined x-rays today

## 2019-11-12 ENCOUNTER — Ambulatory Visit: Payer: Medicare Other | Attending: Internal Medicine

## 2019-11-12 DIAGNOSIS — Z23 Encounter for immunization: Secondary | ICD-10-CM

## 2019-11-12 NOTE — Progress Notes (Signed)
   Covid-19 Vaccination Clinic  Name:  [name] Case    MRN: [id number] DOB: [birth year]  11/12/2019  [name] was observed post Covid-19 immunization for 15 minutes without incident. She was provided with Vaccine Information Sheet and instruction to access the V-Safe system.   [name] was instructed to call 911 with any severe reactions post vaccine: [name] Difficulty breathing  . Swelling of face and throat  . A fast heartbeat  . A bad rash all over body  . Dizziness and weakness   Immunizations Administered    Name Date Dose VIS Date Route   Pfizer COVID-19 Vaccine 11/12/2019  1:14 PM 0.3 mL 08/12/2019 Intramuscular   Manufacturer: [place]   Lot: [id number]   [place]: [id number]

## 2019-11-14 ENCOUNTER — Other Ambulatory Visit: Payer: Self-pay | Admitting: Internal Medicine

## 2019-11-22 ENCOUNTER — Other Ambulatory Visit: Payer: Self-pay | Admitting: Internal Medicine

## 2019-12-01 DIAGNOSIS — J441 Chronic obstructive pulmonary disease with (acute) exacerbation: Secondary | ICD-10-CM | POA: Diagnosis not present

## 2019-12-01 DIAGNOSIS — I5041 Acute combined systolic (congestive) and diastolic (congestive) heart failure: Secondary | ICD-10-CM | POA: Diagnosis not present

## 2019-12-07 ENCOUNTER — Ambulatory Visit: Payer: Medicare Other | Attending: Internal Medicine

## 2019-12-07 DIAGNOSIS — Z23 Encounter for immunization: Secondary | ICD-10-CM

## 2019-12-07 NOTE — Progress Notes (Signed)
   Covid-19 Vaccination Clinic  Name:  [name] Case    MRN: [id number] DOB: 22-Aug-1932  12/07/2019  [name] Case was observed post Covid-19 immunization for 15 minutes without incident. She was provided with Vaccine Information Sheet and instruction to access the V-Safe system.   [name] Case was instructed to call 911 with any severe reactions post vaccine: [name] Difficulty breathing  . Swelling of face and throat  . A fast heartbeat  . A bad rash all over body  . Dizziness and weakness   Immunizations Administered    Name Date Dose VIS Date Route   Pfizer COVID-19 Vaccine 12/07/2019 10:51 AM 0.3 mL 08/12/2019 Intramuscular   Manufacturer: [place]   Lot: [id number]   [place]: [id number]

## 2019-12-10 DIAGNOSIS — J449 Chronic obstructive pulmonary disease, unspecified: Secondary | ICD-10-CM | POA: Diagnosis not present

## 2019-12-15 ENCOUNTER — Other Ambulatory Visit: Payer: Self-pay

## 2019-12-15 ENCOUNTER — Ambulatory Visit (INDEPENDENT_AMBULATORY_CARE_PROVIDER_SITE_OTHER): Payer: Medicare Other | Admitting: Cardiology

## 2019-12-15 ENCOUNTER — Ambulatory Visit: Payer: Medicare Other | Admitting: Cardiology

## 2019-12-15 VITALS — BP 116/62 | HR 93 | Ht 60.0 in | Wt 229.0 lb

## 2019-12-15 DIAGNOSIS — Z79899 Other long term (current) drug therapy: Secondary | ICD-10-CM | POA: Diagnosis not present

## 2019-12-15 DIAGNOSIS — I10 Essential (primary) hypertension: Secondary | ICD-10-CM | POA: Diagnosis not present

## 2019-12-15 DIAGNOSIS — I4821 Permanent atrial fibrillation: Secondary | ICD-10-CM | POA: Diagnosis not present

## 2019-12-15 DIAGNOSIS — E785 Hyperlipidemia, unspecified: Secondary | ICD-10-CM | POA: Diagnosis not present

## 2019-12-15 LAB — CBC
Hematocrit: 31.6 % — ABNORMAL LOW (ref 34.0–46.6)
Hemoglobin: 10.6 g/dL — ABNORMAL LOW (ref 11.1–15.9)
MCH: 31.5 pg (ref 26.6–33.0)
MCHC: 33.5 g/dL (ref 31.5–35.7)
MCV: 94 fL (ref 79–97)
Platelets: 275 10*3/uL (ref 150–450)
RBC: 3.37 x10E6/uL — ABNORMAL LOW (ref 3.77–5.28)
RDW: 13.1 % (ref 11.7–15.4)
WBC: 7.6 10*3/uL (ref 3.4–10.8)

## 2019-12-15 LAB — BASIC METABOLIC PANEL
BUN/Creatinine Ratio: 18 (ref 12–28)
BUN: 16 mg/dL (ref 8–27)
CO2: 28 mmol/L (ref 20–29)
Calcium: 9.4 mg/dL (ref 8.7–10.3)
Chloride: 102 mmol/L (ref 96–106)
Creatinine, Ser: 0.91 mg/dL (ref 0.57–1.00)
GFR calc Af Amer: 66 mL/min/{1.73_m2} (ref >59)
GFR calc non Af Amer: 57 mL/min/{1.73_m2} — ABNORMAL LOW (ref >59)
Glucose: 129 mg/dL — ABNORMAL HIGH (ref 65–99)
Potassium: 4.1 mmol/L (ref 3.5–5.2)
Sodium: 144 mmol/L (ref 134–144)

## 2019-12-15 LAB — LIPID PANEL
Chol/HDL Ratio: 3.6 ratio (ref 0.0–4.4)
Cholesterol, Total: 146 mg/dL (ref 100–199)
HDL: 41 mg/dL (ref >39)
LDL Chol Calc (NIH): 86 mg/dL (ref 0–99)
Triglycerides: 105 mg/dL (ref 0–149)
VLDL Cholesterol Cal: 19 mg/dL (ref 5–40)

## 2019-12-15 LAB — ALT: ALT: 18 IU/L (ref 0–32)

## 2019-12-15 NOTE — Progress Notes (Signed)
Cardiology Office Note:    Date:  12/15/2019   ID:  [name] Case, DOB 07-04-1932, MRN [id number]  PCP:  Hoyt Koch, MD  Cardiologist:  Candee Furbish, MD  Electrophysiologist:  None   Referring MD: Hoyt Koch, *     History of Present Illness:    [name] Case is a 86 y.o. female here for the follow-up of permanent atrial fibrillation hypertension hyperlipidemia COPD with dilated left atrium 47 mm.  Back in 2020 had COPD exacerbation treated with prednisone.  Diuretic was increased for 3 days and edema was resolved.  Overall she has been stable.  Received both [employer] vaccines.  Had a little bit of a rash in arm.  Resolved.  She is here with her grandson.  Perhaps she is having some memory issues.    Past Medical History:  Diagnosis Date  . Allergy   . Arthritis   . Atrial fibrillation ([place])   . Bronchitis   . Cancer ([place])    Bilateral upper tract transitional cell carcinoma  . Cardiomegaly   . CHF (congestive heart failure) ([place])   . Complication of anesthesia    Difficult to arouse  . COPD (chronic obstructive pulmonary disease) ([facility])    borderline   pt. denies at preop  . Diabetes mellitus without complication ([place])    Controlled by diet and exercise  . Diverticulosis of sigmoid colon   . Dyspnea    at times  . Dysrhythmia    a fib  . Family history of bladder cancer   . Family history of colon cancer   . Family history of kidney cancer   . Fatty liver   . GERD (gastroesophageal reflux disease)   . History of hiatal hernia 01/20/2015   Small sliding, noted on CT  . History of kidney stones   . Hyperlipidemia   . Hypertension   . Hypothyroidism   . Lynch syndrome   . PONV (postoperative nausea and vomiting)   . Supraumbilical hernia 0000000   Small noted on CT  . Thyroid disease   . UTI (urinary tract infection)     Past Surgical History:  Procedure Laterality Date  . ABDOMINAL HYSTERECTOMY    . APPENDECTOMY    .  CHOLECYSTECTOMY    . COLONOSCOPY  07/16/2009  . CYSTOSCOPY W/ URETERAL STENT PLACEMENT Right 12/04/2017   Procedure: CYSTOSCOPY WITH RETROGRADE PYELOGRAM/URETERAL STENT PLACEMENT;  Surgeon: Festus Aloe, MD;  Location: [facility];  Service: Urology;  Laterality: Right;  . CYSTOSCOPY W/ URETERAL STENT PLACEMENT Right 02/26/2018   Procedure: CYSTOSCOPY WITH RETROGRADE PYELOGRAM/URETERAL STENT PLACEMENT;  Surgeon: Kathie Rhodes, MD;  Location: [facility];  Service: Urology;  Laterality: Right;  . CYSTOSCOPY WITH URETEROSCOPY AND STENT PLACEMENT Bilateral 01/29/2018   Procedure: BILATERAL URETEROSCOPY AND TUMOR RESECTION  WITH LASER RIGHT STENT EXCHANGE LEFT STENT PLACEMENT;  Surgeon: Ardis Hughs, MD;  Location: [facility];  Service: Urology;  Laterality: Bilateral;  . CYSTOSCOPY WITH URETEROSCOPY AND STENT PLACEMENT Bilateral 02/10/2018   Procedure: BILATERAL URETEROSCOPY AND TUMOR EXCISION BILATERAL STENT EXCHANGE;  Surgeon: Ardis Hughs, MD;  Location: [facility];  Service: Urology;  Laterality: Bilateral;  . CYSTOSCOPY/RETROGRADE/URETEROSCOPY Bilateral 12/24/2017   Procedure: BILATERAL URETEROSCOPY, RIGHT STENT EXCHANGE, STONE EXTRACTION WITH BASKET BILATERAL RETROGRADE PYELOGRAM, BIOPSY OF RIGHT UPPER POLE TUMOR,  BILATERAL RENAL PELVIS FLUID SENT FOR CYTOLOGY;  Surgeon: Ardis Hughs, MD;  Location: [facility];  Service: Urology;  Laterality: Bilateral;  . CYSTOSCOPY/URETEROSCOPY/HOLMIUM LASER/STENT PLACEMENT Bilateral 06/03/2018  Procedure: CYSTOSCOPY/BILATERAL URETEROSCOPY/ LASER TUMOR ABLATION BILATERAL / BILATERAL STENT PLACEMENT/BILATERAL RETROGRADE PYELOGRAM/BLADDER BIOPSY WITH FULGURATION;  Surgeon: Ardis Hughs, MD;  Location: [facility];  Service: Urology;  Laterality: Bilateral;  . HOLMIUM LASER APPLICATION Bilateral [id number]   Procedure: HOLMIUM LASER APPLICATION;  Surgeon: Ardis Hughs, MD;  Location: [facility];  Service: Urology;  Laterality: Bilateral;  . KNEE SURGERY Right     . KNEE SURGERY Left    torn ligament repair  . UPPER GI ENDOSCOPY    . ureteroscopy laser tumor ablation bilateral stent placement     Dr. Louis Meckel 06-03-18     Current Medications: Current Meds  Medication Sig  . acetaminophen (TYLENOL) 500 MG tablet Take 500-1,000 mg by mouth every 8 (eight) hours as needed for moderate pain or headache.   . AMBULATORY NON FORMULARY MEDICATION Rolator  . atorvastatin (LIPITOR) 20 MG tablet TAKE 1 TABLET BY MOUTH DAILY AT 6 PM  . diltiazem (CARDIZEM CD) 180 MG 24 hr capsule TAKE 1 CAPSULE BY MOUTH EVERY DAY  . ELIQUIS 5 MG TABS tablet TAKE 1 TABLET BY MOUTH TWICE A DAY  . fluconazole (DIFLUCAN) 150 MG tablet Take 1 tablet (150 mg total) by mouth every 3 (three) days.  . furosemide (LASIX) 40 MG tablet TAKE 2 TABLETS BY MOUTH EVERY DAY  . ipratropium (ATROVENT) 0.02 % nebulizer solution Take 5 mLs (1 mg total) by nebulization every 6 (six) hours as needed for wheezing or shortness of breath.  . irbesartan (AVAPRO) 150 MG tablet TAKE 1/2 TABLET BY MOUTH EVERY DAY  . KLOR-CON M20 20 MEQ tablet TAKE 1 TABLET BY MOUTH EVERY DAY  . levothyroxine (SYNTHROID) 100 MCG tablet TAKE 1 TABLET BY MOUTH EVERY DAY  . metoprolol succinate (TOPROL-XL) 50 MG 24 hr tablet TAKE 1 TABLET BY MOUTH DAILY WITH OR IMMEDIATELY FOLLOWING A MEAL.  . mirtazapine (REMERON) 15 MG tablet Take 1 tablet (15 mg total) by mouth at bedtime.  . ondansetron (ZOFRAN) 4 MG tablet Take 1 tablet (4 mg total) by mouth every 8 (eight) hours as needed for up to 15 doses for nausea or vomiting.  . triamcinolone cream (KENALOG) 0.1 % Apply 1 application topically 2 (two) times daily.  [name] trimethoprim-polymyxin b (POLYTRIM) ophthalmic solution Place 1 drop into the right eye every 6 (six) hours.     Allergies:   Codeine   Social History   Socioeconomic History  . Marital status: Widowed    Spouse name: Not on file  . Number of children: Not on file  . Years of education: Not on file  . Highest  education level: Not on file  Occupational History  . Occupation: retired  Tobacco Use  . Smoking status: Never Smoker  . Smokeless tobacco: Never Used  Substance and Sexual Activity  . Alcohol use: No    Alcohol/week: 0.0 standard drinks  . Drug use: No  . Sexual activity: Not Currently    Birth control/protection: Surgical  Other Topics Concern  . Not on file  Social History Narrative  . Not on file   Social Determinants of Health   Financial Resource Strain: Low Risk   . Difficulty of Paying Living Expenses: Not hard at all  Food Insecurity: No Food Insecurity  . Worried About Charity fundraiser in the Last Year: Never true  . Ran Out of Food in the Last Year: Never true  Transportation Needs: No Transportation Needs  . Lack of Transportation (Medical): No  . Lack of  Transportation (Non-Medical): No  Physical Activity: Inactive  . Days of Exercise per Week: 0 days  . Minutes of Exercise per Session: 0 min  Stress: No Stress Concern Present  . Feeling of Stress : Not at all  Social Connections: Unknown  . Frequency of Communication with Friends and Family: More than three times a week  . Frequency of Social Gatherings with Friends and Family: More than three times a week  . Attends Religious Services: 1 to 4 times per year  . Active Member of Clubs or Organizations: Not on file  . Attends Archivist Meetings: Not on file  . Marital Status: Not on file     Family History: The patient's family history includes Alcohol abuse in her daughter; Bladder Cancer in her brother and brother; Cancer in her maternal aunt; Colon cancer in her grandchild and another family member; Colon cancer (age of onset: 61) in her mother; Drug abuse in her daughter; Heart disease in her mother; Kidney cancer in her brother; Lung cancer in an other family member; Muscular dystrophy in an other family member; Schizophrenia in her daughter; Skin cancer in her sister; Testicular cancer (age  of onset: 40) in an other family member; Tuberculosis in her father.  ROS:   Please see the history of present illness.    No bleeding no chest pain no significant shortness of breath.  She wears home oxygen.  All other systems reviewed and are negative.  EKGs/Labs/Other Studies Reviewed:    Recent Labs: 04/20/2019: ALT 17; BUN 16; Creatinine, Ser 0.98; Hemoglobin 10.9; Platelets 223; Potassium 4.1; Sodium 141  Recent Lipid Panel    Component Value Date/Time   CHOL 146 02/27/2017 1000   TRIG 114 02/27/2017 1000   HDL 48 02/27/2017 1000   CHOLHDL 3.0 02/27/2017 1000   CHOLHDL 4 06/08/2015 1126   VLDL 29.4 06/08/2015 1126   LDLCALC 75 02/27/2017 1000   LDLDIRECT 188.6 07/19/2007 0000    Physical Exam:    VS:  BP 116/62   Pulse 93   Ht 5' (1.524 m)   Wt 229 lb (103.9 kg)   LMP  (LMP Unknown)   SpO2 96%   BMI 44.72 kg/m     Wt Readings from Last 3 Encounters:  12/15/19 229 lb (103.9 kg)  11/09/19 225 lb (102.1 kg)  11/09/19 225 lb (102.1 kg)     GEN:  Well nourished, well developed in no acute distress HEENT: Normal NECK: No JVD; No carotid bruits LYMPHATICS: No lymphadenopathy CARDIAC: irreg, no murmurs, rubs, gallops RESPIRATORY:  Clear to auscultation without rales, wheezing or rhonchi  ABDOMEN: Soft, non-tender, non-distended MUSCULOSKELETAL:  No edema; No deformity  SKIN: Warm and dry NEUROLOGIC:  Alert and oriented x 3 PSYCHIATRIC:  Normal affect   ASSESSMENT:    1. Permanent atrial fibrillation ([place])   2. Essential hypertension   3. Hyperlipidemia, unspecified hyperlipidemia type   4. Medication management    PLAN:    In order of problems listed above:  Permanent atrial fibrillation -Continue with good rate control.  Unaware.  Chronic anticoagulation -On Eliquis.  We will go ahead and check lab work today hemoglobin, creatinine.  Hyperlipidemia -On atorvastatin 20 mg.  We will check lipid panel.  And ALT.  COPD -On home O2.  Nasal cannula  oxygen currently in clinic.  Stable.  Chronic diastolic heart failure -Overall doing well, stable.  Lasix.   Medication Adjustments/Labs and Tests Ordered: Current medicines are reviewed at length with the patient  today.  Concerns regarding medicines are outlined above.  Orders Placed This Encounter  Procedures  . CBC  . Basic metabolic panel  . ALT  . Lipid panel   No orders of the defined types were placed in this encounter.   Patient Instructions  Medication Instructions:  The current medical regimen is effective;  continue present plan and medications.  *If you need a refill on your cardiac medications before your next appointment, please call your pharmacy*   Lab Work: Please have blood work today (BMP,CBC, Lipid, ALT)  If you have labs (blood work) drawn today and your tests are completely normal, you will receive your results only by: [name] MyChart Message (if you have MyChart) OR . A paper copy in the mail If you have any lab test that is abnormal or we need to change your treatment, we will call you to review the results.   Follow-Up: At [facility], you and your health needs are our priority.  As part of our continuing mission to provide you with exceptional heart care, we have created designated Provider Care Teams.  These Care Teams include your primary Cardiologist (physician) and Advanced Practice Providers (APPs -  Physician Assistants and Nurse Practitioners) who all work together to provide you with the care you need, when you need it.  We recommend signing up for the patient portal called "MyChart".  Sign up information is provided on this After Visit Summary.  MyChart is used to connect with patients for Virtual Visits (Telemedicine).  Patients are able to view lab/test results, encounter notes, upcoming appointments, etc.  Non-urgent messages can be sent to your provider as well.   To learn more about what you can do with MyChart, go to  [email or web address].    Your next appointment:   6 month(s)  The format for your next appointment:   In Person  Provider:   Cecilie Kicks, NP and Dr Marlou Porch in 1 year.   Thank you for choosing [facility]!!        Signed, Candee Furbish, MD  12/15/2019 11:08 AM    [place]

## 2019-12-15 NOTE — Patient Instructions (Signed)
Medication Instructions:  The current medical regimen is effective;  continue present plan and medications.  *If you need a refill on your cardiac medications before your next appointment, please call your pharmacy*   Lab Work: Please have blood work today (BMP,CBC, Lipid, ALT)  If you have labs (blood work) drawn today and your tests are completely normal, you will receive your results only by: [name] MyChart Message (if you have MyChart) OR . A paper copy in the mail If you have any lab test that is abnormal or we need to change your treatment, we will call you to review the results.   Follow-Up: At [facility], you and your health needs are our priority.  As part of our continuing mission to provide you with exceptional heart care, we have created designated Provider Care Teams.  These Care Teams include your primary Cardiologist (physician) and Advanced Practice Providers (APPs -  Physician Assistants and Nurse Practitioners) who all work together to provide you with the care you need, when you need it.  We recommend signing up for the patient portal called "MyChart".  Sign up information is provided on this After Visit Summary.  MyChart is used to connect with patients for Virtual Visits (Telemedicine).  Patients are able to view lab/test results, encounter notes, upcoming appointments, etc.  Non-urgent messages can be sent to your provider as well.   To learn more about what you can do with MyChart, go to [email or web address].    Your next appointment:   6 month(s)  The format for your next appointment:   In Person  Provider:   Cecilie Kicks, NP and Dr Marlou Porch in 1 year.   Thank you for choosing [place]!!

## 2019-12-21 ENCOUNTER — Ambulatory Visit: Payer: Medicare Other | Admitting: Family Medicine

## 2019-12-21 DIAGNOSIS — Z0289 Encounter for other administrative examinations: Secondary | ICD-10-CM

## 2019-12-21 NOTE — Progress Notes (Deleted)
[place] Phone: [phone] Subjective:    I'm seeing this patient by the request  of:  Hoyt Koch, MD  CC:   [id number]   11/09/2019 Right knee was injected today.  Tolerated the procedure well.  Could be a candidate for viscosupplementation.  Due to patient's age and social determinants of health difficulty with transportation patient is not a surgical candidate.  Patient wants to avoid any surgery which I agree with anyhow.  We discussed with patient about home exercise, icing regimen, which activities to do which wants to avoid.  Patient will follow up with me again in 6 weeks.  Patient written for a better walker.  Do not feel the patient would be compliant with a custom brace even though patient does have an abnormal thigh to calf ratio as well as instability of the knee and patient declined x-rays today  Update 12/21/2019 [name] Case is a 86 y.o. female coming in with complaint of right knee pain. Durolnae approved. Patient states       Past Medical History:  Diagnosis Date  . Allergy   . Arthritis   . Atrial fibrillation ([place])   . Bronchitis   . Cancer ([place])    Bilateral upper tract transitional cell carcinoma  . Cardiomegaly   . CHF (congestive heart failure) ([place])   . Complication of anesthesia    Difficult to arouse  . COPD (chronic obstructive pulmonary disease) ([facility])    borderline   pt. denies at preop  . Diabetes mellitus without complication ([place])    Controlled by diet and exercise  . Diverticulosis of sigmoid colon   . Dyspnea    at times  . Dysrhythmia    a fib  . Family history of bladder cancer   . Family history of colon cancer   . Family history of kidney cancer   . Fatty liver   . GERD (gastroesophageal reflux disease)   . History of hiatal hernia 01/20/2015   Small sliding, noted on CT  . History of kidney stones   . Hyperlipidemia   . Hypertension   .  Hypothyroidism   . Lynch syndrome   . PONV (postoperative nausea and vomiting)   . Supraumbilical hernia 0000000   Small noted on CT  . Thyroid disease   . UTI (urinary tract infection)    Past Surgical History:  Procedure Laterality Date  . ABDOMINAL HYSTERECTOMY    . APPENDECTOMY    . CHOLECYSTECTOMY    . COLONOSCOPY  07/16/2009  . CYSTOSCOPY W/ URETERAL STENT PLACEMENT Right 12/04/2017   Procedure: CYSTOSCOPY WITH RETROGRADE PYELOGRAM/URETERAL STENT PLACEMENT;  Surgeon: Festus Aloe, MD;  Location: [facility];  Service: Urology;  Laterality: Right;  . CYSTOSCOPY W/ URETERAL STENT PLACEMENT Right 02/26/2018   Procedure: CYSTOSCOPY WITH RETROGRADE PYELOGRAM/URETERAL STENT PLACEMENT;  Surgeon: Kathie Rhodes, MD;  Location: [facility];  Service: Urology;  Laterality: Right;  . CYSTOSCOPY WITH URETEROSCOPY AND STENT PLACEMENT Bilateral 01/29/2018   Procedure: BILATERAL URETEROSCOPY AND TUMOR RESECTION  WITH LASER RIGHT STENT EXCHANGE LEFT STENT PLACEMENT;  Surgeon: Ardis Hughs, MD;  Location: [facility];  Service: Urology;  Laterality: Bilateral;  . CYSTOSCOPY WITH URETEROSCOPY AND STENT PLACEMENT Bilateral 02/10/2018   Procedure: BILATERAL URETEROSCOPY AND TUMOR EXCISION BILATERAL STENT EXCHANGE;  Surgeon: Ardis Hughs, MD;  Location: [facility];  Service: Urology;  Laterality: Bilateral;  . CYSTOSCOPY/RETROGRADE/URETEROSCOPY Bilateral 12/24/2017   Procedure: BILATERAL URETEROSCOPY, RIGHT STENT  EXCHANGE, STONE EXTRACTION WITH BASKET BILATERAL RETROGRADE PYELOGRAM, BIOPSY OF RIGHT UPPER POLE TUMOR,  BILATERAL RENAL PELVIS FLUID SENT FOR CYTOLOGY;  Surgeon: Ardis Hughs, MD;  Location: [facility];  Service: Urology;  Laterality: Bilateral;  . CYSTOSCOPY/URETEROSCOPY/HOLMIUM LASER/STENT PLACEMENT Bilateral 06/03/2018   Procedure: CYSTOSCOPY/BILATERAL URETEROSCOPY/ LASER TUMOR ABLATION BILATERAL / BILATERAL STENT PLACEMENT/BILATERAL RETROGRADE PYELOGRAM/BLADDER BIOPSY WITH FULGURATION;   Surgeon: Ardis Hughs, MD;  Location: [facility];  Service: Urology;  Laterality: Bilateral;  . HOLMIUM LASER APPLICATION Bilateral [id number]   Procedure: HOLMIUM LASER APPLICATION;  Surgeon: Ardis Hughs, MD;  Location: [facility];  Service: Urology;  Laterality: Bilateral;  . KNEE SURGERY Right   . KNEE SURGERY Left    torn ligament repair  . UPPER GI ENDOSCOPY    . ureteroscopy laser tumor ablation bilateral stent placement     Dr. Louis Meckel 06-03-18    Social History   Socioeconomic History  . Marital status: Widowed    Spouse name: Not on file  . Number of children: Not on file  . Years of education: Not on file  . Highest education level: Not on file  Occupational History  . Occupation: retired  Tobacco Use  . Smoking status: Never Smoker  . Smokeless tobacco: Never Used  Substance and Sexual Activity  . Alcohol use: No    Alcohol/week: 0.0 standard drinks  . Drug use: No  . Sexual activity: Not Currently    Birth control/protection: Surgical  Other Topics Concern  . Not on file  Social History Narrative  . Not on file   Social Determinants of Health   Financial Resource Strain: Low Risk   . Difficulty of Paying Living Expenses: Not hard at all  Food Insecurity: No Food Insecurity  . Worried About Charity fundraiser in the Last Year: Never true  . Ran Out of Food in the Last Year: Never true  Transportation Needs: No Transportation Needs  . Lack of Transportation (Medical): No  . Lack of Transportation (Non-Medical): No  Physical Activity: Inactive  . Days of Exercise per Week: 0 days  . Minutes of Exercise per Session: 0 min  Stress: No Stress Concern Present  . Feeling of Stress : Not at all  Social Connections: Unknown  . Frequency of Communication with Friends and Family: More than three times a week  . Frequency of Social Gatherings with Friends and Family: More than three times a week  . Attends Religious Services: 1 to 4 times per year  . Active  Member of Clubs or Organizations: Not on file  . Attends Archivist Meetings: Not on file  . Marital Status: Not on file   Allergies  Allergen Reactions  . Codeine Hives and Rash    In cough syrup preparations   Family History  Problem Relation Age of Onset  . Heart disease Mother   . Colon cancer Mother 61       d. 40  . Tuberculosis Father   . Bladder Cancer Brother        urothelial cancer  . Bladder Cancer Brother        ureter cancer  . Kidney cancer Brother   . Skin cancer Sister   . Cancer Maternal Aunt        NOS  . Colon cancer Other        dx 1st time under 65, second time at 61  . Lung cancer Other   . Muscular dystrophy Other  d. 16  . Testicular cancer Other 24       great nephew  . Alcohol abuse Daughter   . Drug abuse Daughter   . Schizophrenia Daughter   . Colon cancer Grandchild     Current Outpatient Medications (Endocrine & Metabolic):  .  levothyroxine (SYNTHROID) 100 MCG tablet, TAKE 1 TABLET BY MOUTH EVERY DAY  Current Outpatient Medications (Cardiovascular):  .  atorvastatin (LIPITOR) 20 MG tablet, TAKE 1 TABLET BY MOUTH DAILY AT 6 PM .  diltiazem (CARDIZEM CD) 180 MG 24 hr capsule, TAKE 1 CAPSULE BY MOUTH EVERY DAY .  furosemide (LASIX) 40 MG tablet, TAKE 2 TABLETS BY MOUTH EVERY DAY .  irbesartan (AVAPRO) 150 MG tablet, TAKE 1/2 TABLET BY MOUTH EVERY DAY .  metoprolol succinate (TOPROL-XL) 50 MG 24 hr tablet, TAKE 1 TABLET BY MOUTH DAILY WITH OR IMMEDIATELY FOLLOWING A MEAL.  Current Outpatient Medications (Respiratory):  .  ipratropium (ATROVENT) 0.02 % nebulizer solution, Take 5 mLs (1 mg total) by nebulization every 6 (six) hours as needed for wheezing or shortness of breath.  Current Outpatient Medications (Analgesics):  .  acetaminophen (TYLENOL) 500 MG tablet, Take 500-1,000 mg by mouth every 8 (eight) hours as needed for moderate pain or headache.   Current Outpatient Medications (Hematological):  [name]  ELIQUIS 5 MG  TABS tablet, TAKE 1 TABLET BY MOUTH TWICE A DAY  Current Outpatient Medications (Other):  [name]  AMBULATORY NON FORMULARY MEDICATION, Rolator .  fluconazole (DIFLUCAN) 150 MG tablet, Take 1 tablet (150 mg total) by mouth every 3 (three) days. [name]  KLOR-CON M20 20 MEQ tablet, TAKE 1 TABLET BY MOUTH EVERY DAY .  mirtazapine (REMERON) 15 MG tablet, Take 1 tablet (15 mg total) by mouth at bedtime. .  ondansetron (ZOFRAN) 4 MG tablet, Take 1 tablet (4 mg total) by mouth every 8 (eight) hours as needed for up to 15 doses for nausea or vomiting. .  triamcinolone cream (KENALOG) 0.1 %, Apply 1 application topically 2 (two) times daily. [name]  trimethoprim-polymyxin b (POLYTRIM) ophthalmic solution, Place 1 drop into the right eye every 6 (six) hours.   Reviewed prior external information including notes and imaging from  primary care provider As well as notes that were available from care everywhere and other healthcare systems.  Past medical history, social, surgical and family history all reviewed in electronic medical record.  No pertanent information unless stated regarding to the chief complaint.   Review of Systems:  No headache, visual changes, nausea, vomiting, diarrhea, constipation, dizziness, abdominal pain, skin rash, fevers, chills, night sweats, weight loss, swollen lymph nodes, body aches, joint swelling, chest pain, shortness of breath, mood changes. POSITIVE muscle aches  Objective  There were no vitals taken for this visit.   General: No apparent distress alert and oriented x3 mood and affect normal, dressed appropriately.  HEENT: Pupils equal, extraocular movements intact  Respiratory: Patient's speak in full sentences and does not appear short of breath  Cardiovascular: No lower extremity edema, non tender, no erythema  Neuro: Cranial nerves II through XII are intact, neurovascularly intact in all extremities with 2+ DTRs and 2+ pulses.  Gait normal with good balance and coordination.    MSK:  Non tender with full range of motion and good stability and symmetric strength and tone of shoulders, elbows, wrist, hip, knee and ankles bilaterally.     Impression and Recommendations:     This case required medical decision making of moderate complexity. The above documentation has  been reviewed and is accurate and complete Lyndal Pulley, DO       Note: This dictation was prepared with Dragon dictation along with smaller phrase technology. Any transcriptional errors that result from this process are unintentional.

## 2019-12-31 DIAGNOSIS — I5041 Acute combined systolic (congestive) and diastolic (congestive) heart failure: Secondary | ICD-10-CM | POA: Diagnosis not present

## 2019-12-31 DIAGNOSIS — J441 Chronic obstructive pulmonary disease with (acute) exacerbation: Secondary | ICD-10-CM | POA: Diagnosis not present

## 2020-01-09 DIAGNOSIS — J449 Chronic obstructive pulmonary disease, unspecified: Secondary | ICD-10-CM | POA: Diagnosis not present

## 2020-01-18 ENCOUNTER — Encounter: Payer: Self-pay | Admitting: Internal Medicine

## 2020-01-18 ENCOUNTER — Ambulatory Visit (INDEPENDENT_AMBULATORY_CARE_PROVIDER_SITE_OTHER): Payer: Medicare Other | Admitting: Internal Medicine

## 2020-01-18 ENCOUNTER — Other Ambulatory Visit: Payer: Self-pay

## 2020-01-18 DIAGNOSIS — I5033 Acute on chronic diastolic (congestive) heart failure: Secondary | ICD-10-CM | POA: Diagnosis not present

## 2020-01-18 NOTE — Patient Instructions (Signed)
You can just keep the leg covered with a bandage.    Work on reducing salt in the diet.   Take 2 pills lasix in the morning as normal and 1 pill lasix early afternoon for the next 5 days to help the swelling in the legs.

## 2020-01-18 NOTE — Progress Notes (Signed)
   Subjective:   Patient ID: [name] Case, female    DOB: 1932/06/15, 86 y.o.   MRN: [id number]  HPI The patient is an 86 YO female coming in for sore on left leg. Noticed it last night/this morning. It was bleeding this morning. Sometimes she will scratch in her sleep. She is also having some swelling in the left leg more than usual. Noticed that in the last few weeks. She does have a fairly salty diet and granddaughter helps to provide history and states that they have been having more egg rolls recently. Denies SOB or change in breathing. Uses oxygen chronically. Has tried nothing. Overall it is not changed.   Review of Systems  Constitutional: Negative.   HENT: Negative.   Eyes: Negative.   Respiratory: Negative for cough, chest tightness and shortness of breath.   Cardiovascular: Positive for leg swelling. Negative for chest pain and palpitations.  Gastrointestinal: Negative for abdominal distention, abdominal pain, constipation, diarrhea, nausea and vomiting.  Musculoskeletal: Negative.   Skin: Positive for wound.  Neurological: Negative.   Psychiatric/Behavioral: Negative.     Objective:  Physical Exam Constitutional:      Appearance: She is well-developed.  HENT:     Head: Normocephalic and atraumatic.  Cardiovascular:     Rate and Rhythm: Normal rate and regular rhythm.  Pulmonary:     Effort: Pulmonary effort is normal. No respiratory distress.     Breath sounds: Normal breath sounds. No wheezing or rales.  Abdominal:     General: Bowel sounds are normal. There is no distension.     Palpations: Abdomen is soft.     Tenderness: There is no abdominal tenderness. There is no rebound.  Musculoskeletal:     Cervical back: Normal range of motion.  Skin:    General: Skin is warm and dry.     Comments: Wound appears to be skin which is broken and could be from scratching on the left shin, left leg is swollen more than right leg although both are swollen, no surrounding  cellulitis, some oozing blood from the wound, is not deeper than soft tissue layer  Neurological:     Mental Status: She is alert and oriented to person, place, and time.     Coordination: Coordination normal.     Vitals:   01/18/20 1129  BP: 130/74  Pulse: 80  Temp: 98.3 F (36.8 C)  SpO2: 98%  Weight: 225 lb (102.1 kg)  Height: 5' (1.524 m)    This visit occurred during the SARS-CoV-2 public health emergency.  Safety protocols were in place, including screening questions prior to the visit, additional usage of staff PPE, and extensive cleaning of exam room while observing appropriate contact time as indicated for disinfecting solutions.   Assessment & Plan:

## 2020-01-19 NOTE — Assessment & Plan Note (Signed)
She is not sure if she has been taking lasix lately. Asked her to take 3 pills daily instead of 2 for 4-5 days to see if swelling can be reduced. The wound was dressed with antibioitic ointment and bandage in the office and does not require any antibiotics. Avoid scratching and recommended to trim nails to avoid further injury.

## 2020-01-23 ENCOUNTER — Ambulatory Visit: Payer: Medicare Other | Admitting: Internal Medicine

## 2020-01-24 ENCOUNTER — Encounter: Payer: Self-pay | Admitting: Internal Medicine

## 2020-01-24 ENCOUNTER — Ambulatory Visit (INDEPENDENT_AMBULATORY_CARE_PROVIDER_SITE_OTHER): Payer: Medicare Other | Admitting: Internal Medicine

## 2020-01-24 ENCOUNTER — Other Ambulatory Visit: Payer: Self-pay

## 2020-01-24 VITALS — BP 118/70 | HR 74 | Temp 98.1°F | Ht 60.0 in | Wt 237.8 lb

## 2020-01-24 DIAGNOSIS — I10 Essential (primary) hypertension: Secondary | ICD-10-CM

## 2020-01-24 DIAGNOSIS — E039 Hypothyroidism, unspecified: Secondary | ICD-10-CM | POA: Diagnosis not present

## 2020-01-24 DIAGNOSIS — E559 Vitamin D deficiency, unspecified: Secondary | ICD-10-CM | POA: Diagnosis not present

## 2020-01-24 DIAGNOSIS — E538 Deficiency of other specified B group vitamins: Secondary | ICD-10-CM | POA: Diagnosis not present

## 2020-01-24 DIAGNOSIS — R7301 Impaired fasting glucose: Secondary | ICD-10-CM

## 2020-01-24 DIAGNOSIS — I5033 Acute on chronic diastolic (congestive) heart failure: Secondary | ICD-10-CM | POA: Diagnosis not present

## 2020-01-24 NOTE — Progress Notes (Signed)
   Subjective:   Patient ID: [name] Case, female    DOB: 05/10/1932, 86 y.o.   MRN: [id number]  HPI The patient is an 86 YO female coming in for follow up of left leg. The swelling is still present. She is taking lasix 2 pills in the morning. The wound is scabbed over, still tender. No new redness around the wound. Denies recent falls. She is urinating with the fluid pill but feels that this is not as much as before. She is not down on weight per our scales and does not weigh at home. Granddaughter has some concerns about patient's memory outside room to [place] but then when brought up during visit patient does not feel any memory changes and granddaughter does not mention any concerns either.   Review of Systems  Constitutional: Negative.   HENT: Negative.   Eyes: Negative.   Respiratory: Negative for cough, chest tightness and shortness of breath.   Cardiovascular: Positive for leg swelling. Negative for chest pain and palpitations.  Gastrointestinal: Negative for abdominal distention, abdominal pain, constipation, diarrhea, nausea and vomiting.  Musculoskeletal: Negative.   Skin: Positive for wound.  Neurological: Negative.   Psychiatric/Behavioral: Negative.     Objective:  Physical Exam Constitutional:      Appearance: She is well-developed. She is obese.  HENT:     Head: Normocephalic and atraumatic.  Cardiovascular:     Rate and Rhythm: Normal rate and regular rhythm.  Pulmonary:     Effort: Pulmonary effort is normal. No respiratory distress.     Breath sounds: Normal breath sounds. No wheezing or rales.  Abdominal:     General: Bowel sounds are normal. There is no distension.     Palpations: Abdomen is soft.     Tenderness: There is no abdominal tenderness. There is no rebound.  Musculoskeletal:     Cervical back: Normal range of motion.     Right lower leg: Edema present.     Left lower leg: Edema present.     Comments: Left edema 2+ and tight, right 1-2+ not tight  edema. Wound left leg with scabbing and no surrounding redness or cellulitis.   Skin:    General: Skin is warm and dry.  Neurological:     Mental Status: She is alert and oriented to person, place, and time.     Coordination: Coordination abnormal.     Comments: Was able to walk with walker, usually wheelchair for long distances     Vitals:   01/24/20 1550  BP: 118/70  Pulse: 74  Temp: 98.1 F (36.7 C)  SpO2: 97%  Weight: 237 lb 12.8 oz (107.9 kg)  Height: 5' (1.524 m)    This visit occurred during the SARS-CoV-2 public health emergency.  Safety protocols were in place, including screening questions prior to the visit, additional usage of staff PPE, and extensive cleaning of exam room while observing appropriate contact time as indicated for disinfecting solutions.   Assessment & Plan:

## 2020-01-24 NOTE — Patient Instructions (Signed)
We will check the labs today and increase the lasix (furosemide) to 3 pills in the morning.   You can call us or send a mychart message if this is working and we can make adjustments if not working.

## 2020-01-25 NOTE — Assessment & Plan Note (Signed)
Not improved with resumption of lasix. Will increase to 3 pills (120 mg daily) lasix to see if we can improve her fluid situation. Talked to them as well about the importance of sodium restriction to help and propping up legs. Offered compression stockings which they decline today.

## 2020-01-31 DIAGNOSIS — J441 Chronic obstructive pulmonary disease with (acute) exacerbation: Secondary | ICD-10-CM | POA: Diagnosis not present

## 2020-01-31 DIAGNOSIS — I5041 Acute combined systolic (congestive) and diastolic (congestive) heart failure: Secondary | ICD-10-CM | POA: Diagnosis not present

## 2020-02-06 ENCOUNTER — Ambulatory Visit: Payer: Medicare Other | Admitting: Internal Medicine

## 2020-02-07 ENCOUNTER — Ambulatory Visit: Payer: Medicare Other | Admitting: Internal Medicine

## 2020-02-09 ENCOUNTER — Other Ambulatory Visit: Payer: Self-pay

## 2020-02-09 ENCOUNTER — Encounter: Payer: Self-pay | Admitting: Internal Medicine

## 2020-02-09 ENCOUNTER — Ambulatory Visit (INDEPENDENT_AMBULATORY_CARE_PROVIDER_SITE_OTHER): Payer: Medicare Other | Admitting: Internal Medicine

## 2020-02-09 VITALS — BP 122/78 | HR 71 | Temp 98.2°F | Ht 60.0 in | Wt 232.0 lb

## 2020-02-09 DIAGNOSIS — I5032 Chronic diastolic (congestive) heart failure: Secondary | ICD-10-CM | POA: Diagnosis not present

## 2020-02-09 DIAGNOSIS — J9611 Chronic respiratory failure with hypoxia: Secondary | ICD-10-CM | POA: Diagnosis not present

## 2020-02-09 DIAGNOSIS — J449 Chronic obstructive pulmonary disease, unspecified: Secondary | ICD-10-CM | POA: Diagnosis not present

## 2020-02-09 NOTE — Progress Notes (Signed)
   Subjective:   Patient ID: [name] Case, female    DOB: [birth year], 86 y.o.   MRN: [id number]  HPI The patient is an 86 YO female coming in for follow up of her heart failure and swelling. She is now taking 3 lasix pills daily. She is down about 5 pounds since last visit. Overall she and granddaughter agree that legs are going down some. Left leg is still swollen some and sore. She is having some reduction in abdominal girth as well. Denies SOB or change in breathing recently. Is getting around okay.   Review of Systems  Constitutional: Negative.   HENT: Negative.   Eyes: Negative.   Respiratory: Positive for shortness of breath. Negative for cough and chest tightness.   Cardiovascular: Positive for leg swelling. Negative for chest pain and palpitations.  Gastrointestinal: Negative for abdominal distention, abdominal pain, constipation, diarrhea, nausea and vomiting.  Musculoskeletal: Negative.   Skin: Negative.   Neurological: Negative.   Psychiatric/Behavioral: Negative.     Objective:  Physical Exam Constitutional:      Appearance: She is well-developed.  HENT:     Head: Normocephalic and atraumatic.  Cardiovascular:     Rate and Rhythm: Normal rate and regular rhythm.  Pulmonary:     Effort: Pulmonary effort is normal. No respiratory distress.     Breath sounds: Normal breath sounds. No wheezing or rales.     Comments: Oxygen in place Abdominal:     General: Bowel sounds are normal. There is no distension.     Palpations: Abdomen is soft.     Tenderness: There is no abdominal tenderness. There is no rebound.     Comments: Abdomen less firm than prior and slightly smaller than previous exam  Musculoskeletal:     Cervical back: Normal range of motion.     Right lower leg: Edema present.     Left lower leg: Edema present.     Comments: Right leg improved and minimal swelling on exam, left leg with 1-2+ swelling but improved from prior.   Skin:    General: Skin is warm  and dry.  Neurological:     Mental Status: She is alert and oriented to person, place, and time.     Coordination: Coordination abnormal.     Comments: Walker or wheelchair for ambulation     Vitals:   02/09/20 1448  BP: 122/78  Pulse: 71  Temp: 98.2 F (36.8 C)  TempSrc: Oral  SpO2: 95%  Weight: 232 lb (105.2 kg)  Height: 5' (1.524 m)    This visit occurred during the SARS-CoV-2 public health emergency.  Safety protocols were in place, including screening questions prior to the visit, additional usage of staff PPE, and extensive cleaning of exam room while observing appropriate contact time as indicated for disinfecting solutions.   Assessment & Plan:

## 2020-02-10 ENCOUNTER — Encounter: Payer: Self-pay | Admitting: Internal Medicine

## 2020-02-10 NOTE — Assessment & Plan Note (Signed)
Appears to be in good fluid balance today with lasix 120 mg daily. Will continue this dosing for now. Call for weight gain or change in swelling or SOB.

## 2020-02-10 NOTE — Assessment & Plan Note (Signed)
Stable at this time on oxygen all the time.

## 2020-02-13 ENCOUNTER — Other Ambulatory Visit: Payer: Self-pay | Admitting: Internal Medicine

## 2020-03-01 ENCOUNTER — Other Ambulatory Visit: Payer: Self-pay

## 2020-03-01 ENCOUNTER — Ambulatory Visit: Payer: Medicare Other | Admitting: Internal Medicine

## 2020-03-01 DIAGNOSIS — I5041 Acute combined systolic (congestive) and diastolic (congestive) heart failure: Secondary | ICD-10-CM | POA: Diagnosis not present

## 2020-03-01 DIAGNOSIS — R319 Hematuria, unspecified: Secondary | ICD-10-CM | POA: Diagnosis not present

## 2020-03-01 DIAGNOSIS — Z5321 Procedure and treatment not carried out due to patient leaving prior to being seen by health care provider: Secondary | ICD-10-CM | POA: Diagnosis not present

## 2020-03-01 DIAGNOSIS — J441 Chronic obstructive pulmonary disease with (acute) exacerbation: Secondary | ICD-10-CM | POA: Diagnosis not present

## 2020-03-02 ENCOUNTER — Other Ambulatory Visit: Payer: Self-pay

## 2020-03-02 ENCOUNTER — Emergency Department (HOSPITAL_COMMUNITY)
Admission: EM | Admit: 2020-03-02 | Discharge: 2020-03-02 | Disposition: A | Discharge status: Home / Self Care | Payer: Medicare Other | Attending: Emergency Medicine | Admitting: Emergency Medicine

## 2020-03-02 ENCOUNTER — Encounter (HOSPITAL_COMMUNITY): Payer: Self-pay | Admitting: Emergency Medicine

## 2020-03-02 ENCOUNTER — Ambulatory Visit (INDEPENDENT_AMBULATORY_CARE_PROVIDER_SITE_OTHER): Payer: Medicare Other | Admitting: Internal Medicine

## 2020-03-02 ENCOUNTER — Encounter: Payer: Self-pay | Admitting: Internal Medicine

## 2020-03-02 VITALS — BP 128/86 | HR 67 | Temp 97.9°F | Ht 60.0 in | Wt 230.0 lb

## 2020-03-02 DIAGNOSIS — R310 Gross hematuria: Secondary | ICD-10-CM | POA: Diagnosis not present

## 2020-03-02 DIAGNOSIS — B372 Candidiasis of skin and nail: Secondary | ICD-10-CM | POA: Insufficient documentation

## 2020-03-02 DIAGNOSIS — C689 Malignant neoplasm of urinary organ, unspecified: Secondary | ICD-10-CM | POA: Diagnosis not present

## 2020-03-02 LAB — POCT URINALYSIS DIPSTICK
Bilirubin, UA: NEGATIVE
Glucose, UA: NEGATIVE
Ketones, UA: NEGATIVE
Nitrite, UA: POSITIVE
Protein, UA: POSITIVE — AB
Spec Grav, UA: 1.015 (ref 1.010–1.025)
Urobilinogen, UA: 1 E.U./dL
pH, UA: 6 (ref 5.0–8.0)

## 2020-03-02 LAB — BASIC METABOLIC PANEL
Anion gap: 10 (ref 5–15)
BUN: 22 mg/dL (ref 8–23)
CO2: 27 mmol/L (ref 22–32)
Calcium: 8.9 mg/dL (ref 8.9–10.3)
Chloride: 101 mmol/L (ref 98–111)
Creatinine, Ser: 1.05 mg/dL — ABNORMAL HIGH (ref 0.44–1.00)
GFR calc Af Amer: 56 mL/min — ABNORMAL LOW (ref >60)
GFR calc non Af Amer: 48 mL/min — ABNORMAL LOW (ref >60)
Glucose, Bld: 132 mg/dL — ABNORMAL HIGH (ref 70–99)
Potassium: 4 mmol/L (ref 3.5–5.1)
Sodium: 138 mmol/L (ref 135–145)

## 2020-03-02 LAB — CBC
HCT: 34.4 % — ABNORMAL LOW (ref 36.0–46.0)
Hemoglobin: 10.8 g/dL — ABNORMAL LOW (ref 12.0–15.0)
MCH: 30.9 pg (ref 26.0–34.0)
MCHC: 31.4 g/dL (ref 30.0–36.0)
MCV: 98.6 fL (ref 80.0–100.0)
Platelets: 263 10*3/uL (ref 150–400)
RBC: 3.49 MIL/uL — ABNORMAL LOW (ref 3.87–5.11)
RDW: 13.8 % (ref 11.5–15.5)
WBC: 9.3 10*3/uL (ref 4.0–10.5)
nRBC: 0 % (ref 0.0–0.2)

## 2020-03-02 LAB — URINALYSIS, ROUTINE W REFLEX MICROSCOPIC: RBC / HPF: >50 RBC/hpf — ABNORMAL HIGH (ref 0–5)

## 2020-03-02 MED ORDER — CIPROFLOXACIN HCL 500 MG PO TABS
500.0000 mg | ORAL_TABLET | Freq: Two times a day (BID) | ORAL | 0 refills | Status: AC
Start: 2020-03-02 — End: 2020-03-09

## 2020-03-02 MED ORDER — NYSTATIN 100000 UNIT/GM EX OINT: 1.0000 "application " | TOPICAL_OINTMENT | Freq: Two times a day (BID) | CUTANEOUS | 3 refills | Status: DC

## 2020-03-02 MED ORDER — SODIUM CHLORIDE 0.9% FLUSH: 3.0000 mL | Freq: Once | INTRAVENOUS | Status: DC

## 2020-03-02 NOTE — Assessment & Plan Note (Signed)
Rx nystatin ointment to use under the breasts BID.

## 2020-03-02 NOTE — Assessment & Plan Note (Signed)
Needs referral back to urology as she has not followed up with them in some time and with new gross hematuria it is concerning for recurrence.

## 2020-03-02 NOTE — ED Triage Notes (Signed)
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

## 2020-03-02 NOTE — Progress Notes (Signed)
° °  Subjective:   Patient ID: [name] Case, female    DOB: 1931-10-28, 86 y.o.   MRN: [id number]  HPI The patient is an 86 YO female coming in for concerns about blood in urine. Went to ER last night but did not stay to be seen. BMP CBC stable. U/A done but could not analyze due to red color. Sent for urine culture. She denies dizziness or lightheadedness. She does have history of urothelial cancer and no recent checks on this. She is concerned this may be recurrent. She has history of kidney stones but this does not feel similar to that. She denies pain in stomach or back. Not having burning on urination. Color has lessened some since yesterday but still red urine. Started about 1 week ago overall. Has been worsening and so she decided to get checked out. She also has a rash under her breasts which is itching. Going on for months but worsening with the heat. Itching and hurting. Tries not to scratch. Tried using napkins to absorb moisture but does not change regularly and this has not helped.   Review of Systems  Constitutional: Negative.   HENT: Negative.   Eyes: Negative.   Respiratory: Negative for cough, chest tightness and shortness of breath.   Cardiovascular: Negative for chest pain, palpitations and leg swelling.  Gastrointestinal: Negative for abdominal distention, abdominal pain, constipation, diarrhea, nausea and vomiting.  Genitourinary: Positive for hematuria. Negative for difficulty urinating, dysuria and flank pain.  Musculoskeletal: Negative.   Skin: Positive for rash.  Neurological: Negative.   Psychiatric/Behavioral: Negative.     Objective:  Physical Exam Constitutional:      Appearance: She is well-developed.  HENT:     Head: Normocephalic and atraumatic.  Cardiovascular:     Rate and Rhythm: Normal rate and regular rhythm.  Pulmonary:     Effort: Pulmonary effort is normal. No respiratory distress.     Breath sounds: Normal breath sounds. No wheezing or rales.    Abdominal:     General: Bowel sounds are normal. There is no distension.     Palpations: Abdomen is soft.     Tenderness: There is no abdominal tenderness. There is no rebound.  Musculoskeletal:     Cervical back: Normal range of motion.  Skin:    General: Skin is warm and dry.     Findings: Rash present.     Comments: Rash consistent with fungal under bilateral breasts.   Neurological:     Mental Status: She is alert and oriented to person, place, and time.     Coordination: Coordination normal.     Vitals:   03/02/20 0848  BP: 128/86  Pulse: 67  Temp: 97.9 F (36.6 C)  TempSrc: Oral  SpO2: 96%  Weight: 230 lb (104.3 kg)  Height: 5' (1.524 m)    This visit occurred during the SARS-CoV-2 public health emergency.  Safety protocols were in place, including screening questions prior to the visit, additional usage of staff PPE, and extensive cleaning of exam room while observing appropriate contact time as indicated for disinfecting solutions.   Assessment & Plan:

## 2020-03-02 NOTE — Patient Instructions (Signed)
We have sent in the cream for the yeast infection to use twice a day under the breasts.   We have sent in 1 week of antibiotic called ciprofloxacin to take 1 pill twice a day.   Call us next week to let us know if you are still having the blood in the urine or not.

## 2020-03-02 NOTE — Assessment & Plan Note (Signed)
CBC stable at ER recently with ongoing symptoms for 1 week. U/A done at office today with less redness although still red in appearance and able to be analyzed with signs of infection. Treat with 1 week ciprofloxacin to cover renal penetration. Referral to urology given history. She is asked to call back in 3-4 days to update [place] on her conditions if better or no better.

## 2020-03-03 LAB — URINE CULTURE

## 2020-03-10 DIAGNOSIS — J449 Chronic obstructive pulmonary disease, unspecified: Secondary | ICD-10-CM | POA: Diagnosis not present

## 2020-03-13 ENCOUNTER — Telehealth: Payer: Self-pay | Admitting: Internal Medicine

## 2020-03-13 ENCOUNTER — Other Ambulatory Visit: Payer: Self-pay | Admitting: Internal Medicine

## 2020-03-13 NOTE — Telephone Encounter (Signed)
° ° °  Patient calling to report blood in urine for 3 days. She requested medication be called to pharmacy. Advised patient appointment needed Declined same day appointment due to transportation  Appointment scheduled 7/14   Call transferred to [facility] for immediate advice

## 2020-03-14 ENCOUNTER — Ambulatory Visit: Payer: Medicare Other | Admitting: Internal Medicine

## 2020-03-14 NOTE — Telephone Encounter (Signed)
Pt was on the schedule for today but pt cancelled appt.

## 2020-03-14 NOTE — Telephone Encounter (Signed)
Per [facility], advised to see PCP with 4 hours.  Patient scheduled for 03/14/2020.

## 2020-03-15 ENCOUNTER — Ambulatory Visit: Payer: Medicare Other | Admitting: Internal Medicine

## 2020-03-15 DIAGNOSIS — Z0289 Encounter for other administrative examinations: Secondary | ICD-10-CM

## 2020-03-15 NOTE — Telephone Encounter (Signed)
The pt states that she is no longer experiencing any blood in her urine. She stated that it is back to a yellow color. In regard to the antibiotic the pt stated that it worked to help clear up her urine. No diziness or lightheadedness. I informed her that this information would be sent to PCP and if any follow up questions or comments were needed that someone from the office would give her a call tomorrow.

## 2020-03-15 NOTE — Telephone Encounter (Signed)
Patient has canceled/no showed twice for this appointment. Can you call and find out if she is still having blood in urine? If so it urine just pink or just blood? Did the antibiotic we gave her after last visit help clear the blood from urine, what date did it start back again? Ask if any lightheadedness or dizzines (if so send to ER). We may be able to give her advice over the phone depending.

## 2020-03-16 NOTE — Telephone Encounter (Signed)
No follow up needed

## 2020-04-01 DIAGNOSIS — J441 Chronic obstructive pulmonary disease with (acute) exacerbation: Secondary | ICD-10-CM | POA: Diagnosis not present

## 2020-04-01 DIAGNOSIS — I5041 Acute combined systolic (congestive) and diastolic (congestive) heart failure: Secondary | ICD-10-CM | POA: Diagnosis not present

## 2020-04-10 DIAGNOSIS — J449 Chronic obstructive pulmonary disease, unspecified: Secondary | ICD-10-CM | POA: Diagnosis not present

## 2020-04-12 ENCOUNTER — Other Ambulatory Visit: Payer: Self-pay | Admitting: Internal Medicine

## 2020-05-02 DIAGNOSIS — J441 Chronic obstructive pulmonary disease with (acute) exacerbation: Secondary | ICD-10-CM | POA: Diagnosis not present

## 2020-05-02 DIAGNOSIS — I5041 Acute combined systolic (congestive) and diastolic (congestive) heart failure: Secondary | ICD-10-CM | POA: Diagnosis not present

## 2020-05-09 ENCOUNTER — Other Ambulatory Visit: Payer: Self-pay | Admitting: Internal Medicine

## 2020-05-11 DIAGNOSIS — J449 Chronic obstructive pulmonary disease, unspecified: Secondary | ICD-10-CM | POA: Diagnosis not present

## 2020-05-28 ENCOUNTER — Other Ambulatory Visit: Payer: Self-pay | Admitting: Internal Medicine

## 2020-06-01 DIAGNOSIS — I5041 Acute combined systolic (congestive) and diastolic (congestive) heart failure: Secondary | ICD-10-CM | POA: Diagnosis not present

## 2020-06-01 DIAGNOSIS — J441 Chronic obstructive pulmonary disease with (acute) exacerbation: Secondary | ICD-10-CM | POA: Diagnosis not present

## 2020-06-07 ENCOUNTER — Encounter (HOSPITAL_COMMUNITY): Payer: Self-pay

## 2020-06-07 ENCOUNTER — Other Ambulatory Visit: Payer: Self-pay

## 2020-06-07 ENCOUNTER — Emergency Department (HOSPITAL_COMMUNITY)
Admission: EM | Admit: 2020-06-07 | Discharge: 2020-06-08 | Disposition: A | Discharge status: Home / Self Care | Payer: Medicare Other | Attending: Emergency Medicine | Admitting: Emergency Medicine

## 2020-06-07 ENCOUNTER — Emergency Department (HOSPITAL_COMMUNITY): Payer: Medicare Other

## 2020-06-07 DIAGNOSIS — Z8551 Personal history of malignant neoplasm of bladder: Secondary | ICD-10-CM | POA: Diagnosis not present

## 2020-06-07 DIAGNOSIS — Z85528 Personal history of other malignant neoplasm of kidney: Secondary | ICD-10-CM | POA: Insufficient documentation

## 2020-06-07 DIAGNOSIS — Z79890 Hormone replacement therapy (postmenopausal): Secondary | ICD-10-CM | POA: Insufficient documentation

## 2020-06-07 DIAGNOSIS — I5032 Chronic diastolic (congestive) heart failure: Secondary | ICD-10-CM | POA: Diagnosis not present

## 2020-06-07 DIAGNOSIS — J449 Chronic obstructive pulmonary disease, unspecified: Secondary | ICD-10-CM | POA: Insufficient documentation

## 2020-06-07 DIAGNOSIS — N1330 Unspecified hydronephrosis: Secondary | ICD-10-CM | POA: Diagnosis not present

## 2020-06-07 DIAGNOSIS — Z7901 Long term (current) use of anticoagulants: Secondary | ICD-10-CM | POA: Diagnosis not present

## 2020-06-07 DIAGNOSIS — E039 Hypothyroidism, unspecified: Secondary | ICD-10-CM | POA: Insufficient documentation

## 2020-06-07 DIAGNOSIS — R319 Hematuria, unspecified: Secondary | ICD-10-CM | POA: Diagnosis not present

## 2020-06-07 DIAGNOSIS — I110 Hypertensive heart disease with heart failure: Secondary | ICD-10-CM | POA: Insufficient documentation

## 2020-06-07 DIAGNOSIS — E119 Type 2 diabetes mellitus without complications: Secondary | ICD-10-CM | POA: Insufficient documentation

## 2020-06-07 DIAGNOSIS — R310 Gross hematuria: Secondary | ICD-10-CM

## 2020-06-07 DIAGNOSIS — I700 Atherosclerosis of aorta: Secondary | ICD-10-CM | POA: Diagnosis not present

## 2020-06-07 DIAGNOSIS — K5730 Diverticulosis of large intestine without perforation or abscess without bleeding: Secondary | ICD-10-CM | POA: Diagnosis not present

## 2020-06-07 DIAGNOSIS — Z79899 Other long term (current) drug therapy: Secondary | ICD-10-CM | POA: Insufficient documentation

## 2020-06-07 LAB — CBC
HCT: 33.1 % — ABNORMAL LOW (ref 36.0–46.0)
Hemoglobin: 10.2 g/dL — ABNORMAL LOW (ref 12.0–15.0)
MCH: 29.8 pg (ref 26.0–34.0)
MCHC: 30.8 g/dL (ref 30.0–36.0)
MCV: 96.8 fL (ref 80.0–100.0)
Platelets: 352 10*3/uL (ref 150–400)
RBC: 3.42 MIL/uL — ABNORMAL LOW (ref 3.87–5.11)
RDW: 14.8 % (ref 11.5–15.5)
WBC: 8 10*3/uL (ref 4.0–10.5)
nRBC: 0 % (ref 0.0–0.2)

## 2020-06-07 LAB — URINALYSIS, ROUTINE W REFLEX MICROSCOPIC

## 2020-06-07 LAB — COMPREHENSIVE METABOLIC PANEL
ALT: 18 U/L (ref 0–44)
AST: 30 U/L (ref 15–41)
Albumin: 3.5 g/dL (ref 3.5–5.0)
Alkaline Phosphatase: 205 U/L — ABNORMAL HIGH (ref 38–126)
Anion gap: 10 (ref 5–15)
BUN: 24 mg/dL — ABNORMAL HIGH (ref 8–23)
CO2: 28 mmol/L (ref 22–32)
Calcium: 9.2 mg/dL (ref 8.9–10.3)
Chloride: 98 mmol/L (ref 98–111)
Creatinine, Ser: 1.08 mg/dL — ABNORMAL HIGH (ref 0.44–1.00)
GFR calc non Af Amer: 46 mL/min — ABNORMAL LOW (ref >60)
Glucose, Bld: 95 mg/dL (ref 70–99)
Potassium: 4.6 mmol/L (ref 3.5–5.1)
Sodium: 136 mmol/L (ref 135–145)
Total Bilirubin: 1.1 mg/dL (ref 0.3–1.2)
Total Protein: 6.7 g/dL (ref 6.5–8.1)

## 2020-06-07 LAB — LIPASE, BLOOD: Lipase: 24 U/L (ref 11–51)

## 2020-06-07 LAB — URINALYSIS, MICROSCOPIC (REFLEX)
Bacteria, UA: NONE SEEN
RBC / HPF: >50 RBC/hpf (ref 0–5)
Squamous Epithelial / HPF: NONE SEEN (ref 0–5)

## 2020-06-07 NOTE — ED Triage Notes (Signed)
Pt reports bright red blood in her urine that started today, hx of same when she was diagnosed with kidney cancer. Pt denies pain or n/v

## 2020-06-07 NOTE — ED Provider Notes (Signed)
[facility] EMERGENCY DEPARTMENT Provider Note   CSN: [id number] Arrival date & time: 06/07/20  1656     History Chief Complaint  Patient presents with  . Hematuria    [name] Case is a 86 y.o. female.  Patient is a 86 year old female who presents with hematuria.  She states she has had blood in her urine since yesterday.  She says is every time she urinates.  She denies any pain on urination.  No urinary frequency.  No abdominal pain.  No back pain.  No fevers.  No nausea or vomiting.  There is some discrepancy with the triage note.  Reportedly it said that she had had a history of this when she was diagnosed with cancer in the past.  Initially she said that she has never had hematuria in the past.  I did remind her of them note in her chart from July where she had had some hematuria and was put on antibiotics.  Reportedly it resolved.  [name]  She was seen at [facility] urology for this.  She has not had any recent treatments.  She is on Eliquis for atrial fibrillation.        Past Medical History:  Diagnosis Date  . Allergy   . Arthritis   . Atrial fibrillation ([place])   . Bronchitis   . Cancer ([place])    Bilateral upper tract transitional cell carcinoma  . Cardiomegaly   . CHF (congestive heart failure) ([place])   . Complication of anesthesia    Difficult to arouse  . COPD (chronic obstructive pulmonary disease) ([facility])    borderline   pt. denies at preop  . Diabetes mellitus without complication ([place])    Controlled by diet and exercise  . Diverticulosis of sigmoid colon   . Dyspnea    at times  . Dysrhythmia    a fib  . Family history of bladder cancer   . Family history of colon cancer   . Family history of kidney cancer   . Fatty liver   . GERD (gastroesophageal reflux disease)   . History of hiatal hernia 01/20/2015    Small sliding, noted on CT  . History of kidney stones   . Hyperlipidemia   . Hypertension   . Hypothyroidism   . Lynch syndrome   . PONV (postoperative nausea and vomiting)   . Supraumbilical hernia 07/37/1062   Small noted on CT  . Thyroid disease   . UTI (urinary tract infection)     Patient Active Problem List   Diagnosis Date Noted  . Gross hematuria 03/02/2020  . Candidal skin infection 03/02/2020  . Degenerative arthritis of right knee 11/09/2019  . COPD with acute exacerbation ([place]) 07/30/2018  . Lynch syndrome 05/05/2018  . Genetic testing 04/28/2018  . Adjustment disorder 04/23/2018  . Urothelial cancer ([place]) 04/12/2018  . Lichen planus 69/48/5462  . Obesity hypoventilation syndrome ([place])   . Hypokalemia   . Chronic diastolic (congestive) heart failure ([place]) 01/09/2016  . History of snoring 05/20/2015  . Chronic respiratory failure with hypoxia ([place])   . Type 2 diabetes mellitus without complication ([place])   . Atrial fibrillation ([place]) 01/06/2015  . Hypothyroidism 03/09/2007  . Hyperlipidemia 03/09/2007  . Morbid obesity ([place]) 03/09/2007  . Essential hypertension 03/09/2007  . ALLERGIC RHINITIS 03/09/2007  . Asthma 03/09/2007  . GERD  03/09/2007  . INCONTINENCE 03/09/2007    Past Surgical History:  Procedure Laterality Date  . ABDOMINAL HYSTERECTOMY    . APPENDECTOMY    . CHOLECYSTECTOMY    . COLONOSCOPY  07/16/2009  . CYSTOSCOPY W/ URETERAL STENT PLACEMENT Right 12/04/2017   Procedure: CYSTOSCOPY WITH RETROGRADE PYELOGRAM/URETERAL STENT PLACEMENT;  Surgeon: Festus Aloe, MD;  Location: [facility];  Service: Urology;  Laterality: Right;  . CYSTOSCOPY W/ URETERAL STENT PLACEMENT Right 02/26/2018   Procedure: CYSTOSCOPY WITH RETROGRADE PYELOGRAM/URETERAL STENT PLACEMENT;  Surgeon: Kathie Rhodes, MD;  Location: [facility];  Service: Urology;  Laterality: Right;  . CYSTOSCOPY WITH URETEROSCOPY AND STENT PLACEMENT Bilateral 01/29/2018   Procedure: BILATERAL  URETEROSCOPY AND TUMOR RESECTION  WITH LASER RIGHT STENT EXCHANGE LEFT STENT PLACEMENT;  Surgeon: Ardis Hughs, MD;  Location: [facility];  Service: Urology;  Laterality: Bilateral;  . CYSTOSCOPY WITH URETEROSCOPY AND STENT PLACEMENT Bilateral 02/10/2018   Procedure: BILATERAL URETEROSCOPY AND TUMOR EXCISION BILATERAL STENT EXCHANGE;  Surgeon: Ardis Hughs, MD;  Location: [facility];  Service: Urology;  Laterality: Bilateral;  . CYSTOSCOPY/RETROGRADE/URETEROSCOPY Bilateral 12/24/2017   Procedure: BILATERAL URETEROSCOPY, RIGHT STENT EXCHANGE, STONE EXTRACTION WITH BASKET BILATERAL RETROGRADE PYELOGRAM, BIOPSY OF RIGHT UPPER POLE TUMOR,  BILATERAL RENAL PELVIS FLUID SENT FOR CYTOLOGY;  Surgeon: Ardis Hughs, MD;  Location: [facility];  Service: Urology;  Laterality: Bilateral;  . CYSTOSCOPY/URETEROSCOPY/HOLMIUM LASER/STENT PLACEMENT Bilateral 06/03/2018   Procedure: CYSTOSCOPY/BILATERAL URETEROSCOPY/ LASER TUMOR ABLATION BILATERAL / BILATERAL STENT PLACEMENT/BILATERAL RETROGRADE PYELOGRAM/BLADDER BIOPSY WITH FULGURATION;  Surgeon: Ardis Hughs, MD;  Location: [facility];  Service: Urology;  Laterality: Bilateral;  . HOLMIUM LASER APPLICATION Bilateral 53/01/6439   Procedure: HOLMIUM LASER APPLICATION;  Surgeon: Ardis Hughs, MD;  Location: [facility];  Service: Urology;  Laterality: Bilateral;  . KNEE SURGERY Right   . KNEE SURGERY Left    torn ligament repair  . UPPER GI ENDOSCOPY    . ureteroscopy laser tumor ablation bilateral stent placement     Dr. Louis Meckel 06-03-18      OB History   No obstetric history on file.     Family History  Problem Relation Age of Onset  . Heart disease Mother   . Colon cancer Mother 82       d. 92  . Tuberculosis Father   . Bladder Cancer Brother        urothelial cancer  . Bladder Cancer Brother        ureter cancer  . Kidney cancer Brother   . Skin cancer Sister   . Cancer Maternal Aunt        NOS  . Colon cancer Other        dx 1st time  under 35, second time at 87  . Lung cancer Other   . Muscular dystrophy Other        d. 46  . Testicular cancer Other 24       great nephew  . Alcohol abuse Daughter   . Drug abuse Daughter   . Schizophrenia Daughter   . Colon cancer Grandchild     Social History   Tobacco Use  . Smoking status: Never Smoker  . Smokeless tobacco: Never Used  Vaping Use  . Vaping Use: Never used  Substance Use Topics  . Alcohol use: No    Alcohol/week: 0.0 standard drinks  . Drug use: No    Home Medications Prior to Admission medications   Medication Sig Start Date End Date Taking? Authorizing Provider  acetaminophen (TYLENOL)  500 MG tablet Take 500-1,000 mg by mouth every 8 (eight) hours as needed for moderate pain or headache.     [provider]  AMBULATORY NON FORMULARY MEDICATION [facility] 11/09/19   Lyndal Pulley, DO  atorvastatin (LIPITOR) 20 MG tablet TAKE 1 TABLET BY MOUTH DAILY AT 6 PM 05/09/20   Hoyt Koch, MD  diltiazem (CARDIZEM CD) 180 MG 24 hr capsule TAKE 1 CAPSULE BY MOUTH EVERY DAY 11/22/19   Hoyt Koch, MD  ELIQUIS 5 MG TABS tablet TAKE 1 TABLET BY MOUTH TWICE A DAY 02/13/20   Hoyt Koch, MD  furosemide (LASIX) 40 MG tablet TAKE 2 TABLETS BY MOUTH EVERY DAY 11/14/19   Hoyt Koch, MD  ipratropium (ATROVENT) 0.02 % nebulizer solution Take 5 mLs (1 mg total) by nebulization every 6 (six) hours as needed for wheezing or shortness of breath. 08/04/18   Georgette Shell, MD  irbesartan (AVAPRO) 150 MG tablet TAKE 1/2 TABLET BY MOUTH EVERY DAY 03/13/20   Hoyt Koch, MD  KLOR-CON M20 20 MEQ tablet TAKE 1 TABLET BY MOUTH EVERY DAY 04/12/20   Hoyt Koch, MD  levothyroxine (SYNTHROID) 100 MCG tablet TAKE 1 TABLET BY MOUTH EVERY DAY 05/28/20   Hoyt Koch, MD  metoprolol succinate (TOPROL-XL) 50 MG 24 hr tablet TAKE 1 TABLET BY MOUTH DAILY WITH OR IMMEDIATELY FOLLOWING A MEAL. 10/24/19   Hoyt Koch,  MD  mirtazapine (REMERON) 15 MG tablet Take 1 tablet (15 mg total) by mouth at bedtime. 07/04/19   Hoyt Koch, MD  nystatin ointment (MYCOSTATIN) Apply 1 application topically 2 (two) times daily. 03/02/20   Hoyt Koch, MD  ondansetron (ZOFRAN) 4 MG tablet Take 1 tablet (4 mg total) by mouth every 8 (eight) hours as needed for up to 15 doses for nausea or vomiting. 04/20/19   Curatolo, Adam, DO  triamcinolone cream (KENALOG) 0.1 % Apply 1 application topically 2 (two) times daily. 11/09/19   Hoyt Koch, MD    Allergies    Codeine  Review of Systems   Review of Systems  Constitutional: Negative for chills, diaphoresis, fatigue and fever.  HENT: Negative for congestion, rhinorrhea and sneezing.   Eyes: Negative.   Respiratory: Negative for cough, chest tightness and shortness of breath.   Cardiovascular: Negative for chest pain and leg swelling.  Gastrointestinal: Negative for abdominal pain, blood in stool, diarrhea, nausea and vomiting.  Genitourinary: Positive for hematuria. Negative for difficulty urinating, flank pain and frequency.  Musculoskeletal: Negative for arthralgias and back pain.  Skin: Negative for rash.  Neurological: Negative for dizziness, speech difficulty, weakness, numbness and headaches.    Physical Exam Updated Vital Signs BP 127/72   Pulse 95   Temp 98.5 F (36.9 C) (Oral)   Resp 16   Ht 5' (1.524 m)   Wt 99.8 kg   LMP  (LMP Unknown)   SpO2 99%   BMI 42.97 kg/m   Physical Exam Constitutional:      Appearance: She is well-developed.  HENT:     Head: Normocephalic and atraumatic.  Eyes:     Pupils: Pupils are equal, round, and reactive to light.  Cardiovascular:     Rate and Rhythm: Normal rate and regular rhythm.     Heart sounds: Normal heart sounds.  Pulmonary:     Effort: Pulmonary effort is normal. No respiratory distress.     Breath sounds: Normal breath sounds. No wheezing or rales.  Chest:  Chest wall: No  tenderness.  Abdominal:     General: Bowel sounds are normal.     Palpations: Abdomen is soft.     Tenderness: There is no abdominal tenderness. There is no guarding or rebound.  Genitourinary:    Comments: Exam of the vaginal introitus shows some irritation and a yeasty discharge.  No bleeding is identified. Musculoskeletal:        General: Normal range of motion.     Cervical back: Normal range of motion and neck supple.  Lymphadenopathy:     Cervical: No cervical adenopathy.  Skin:    General: Skin is warm and dry.     Findings: No rash.  Neurological:     Mental Status: She is alert and oriented to person, place, and time.     ED Results / Procedures / Treatments   Labs (all labs ordered are listed, but only abnormal results are displayed) Labs Reviewed  COMPREHENSIVE METABOLIC PANEL - Abnormal; Notable for the following components:      Result Value   BUN 24 (*)    Creatinine, Ser 1.08 (*)    Alkaline Phosphatase 205 (*)    GFR calc non Af Amer 46 (*)    All other components within normal limits  CBC - Abnormal; Notable for the following components:   RBC 3.42 (*)    Hemoglobin 10.2 (*)    HCT 33.1 (*)    All other components within normal limits  URINALYSIS, ROUTINE W REFLEX MICROSCOPIC - Abnormal; Notable for the following components:   Color, Urine RED (*)    APPearance TURBID (*)    Glucose, UA   (*)    Value: TEST NOT REPORTED DUE TO COLOR INTERFERENCE OF URINE PIGMENT   Hgb urine dipstick   (*)    Value: TEST NOT REPORTED DUE TO COLOR INTERFERENCE OF URINE PIGMENT   Bilirubin Urine   (*)    Value: TEST NOT REPORTED DUE TO COLOR INTERFERENCE OF URINE PIGMENT   Ketones, ur   (*)    Value: TEST NOT REPORTED DUE TO COLOR INTERFERENCE OF URINE PIGMENT   Protein, ur   (*)    Value: TEST NOT REPORTED DUE TO COLOR INTERFERENCE OF URINE PIGMENT   Nitrite   (*)    Value: TEST NOT REPORTED DUE TO COLOR INTERFERENCE OF URINE PIGMENT   Leukocytes,Ua   (*)    Value:  TEST NOT REPORTED DUE TO COLOR INTERFERENCE OF URINE PIGMENT   All other components within normal limits  URINE CULTURE  LIPASE, BLOOD  URINALYSIS, MICROSCOPIC (REFLEX)    EKG None  Radiology No results found.  Procedures Procedures (including critical care time)  Medications Ordered in ED Medications - No data to display  ED Course  I have reviewed the triage vital signs and the nursing notes.  Pertinent labs & imaging results that were available during my care of the patient were reviewed by me and considered in my medical decision making (see chart for details).    MDM Rules/Calculators/A&P                          Patient is a 86 year old female who presents with gross hematuria.  She does not have any associated pain.  Her urine does not appear to be consistent with infection.  There is no WBCs or bacteria.  Urine is grossly bloody.  She is having no difficulty with urinary retention.  Her creatinine is  similar to prior values.  Her hemoglobin is similar to prior values.  Alkaline phosphatase level is elevated.  She is awaiting a CT scan.  Care turned over to Dr. Betsey Holiday pending CT. Final Clinical Impression(s) / ED Diagnoses Final diagnoses:  Gross hematuria    Rx / DC Orders ED Discharge Orders    None       Malvin Johns, MD 06/07/20 2340

## 2020-06-07 NOTE — Discharge Instructions (Addendum)
Stop taking your Eliquis for the next 2 days.  Follow-up with the urologist as soon as possible.  Return here as needed if you have any worsening symptoms.

## 2020-06-08 NOTE — ED Notes (Signed)
Pt d.c by MD and is provided with d.c instructions and follow up care pt is out of the ED in wheel chair accompany by family

## 2020-06-08 NOTE — ED Provider Notes (Signed)
Patient signed out to me by Dr. Tamera Punt to follow-up on CT scan.  CT scan has been performed and does show evidence of likely recurrence of her cancer.  These findings were discussed with the patient and her daughter by phone.  Patient has remained stable here in the ED.  No significant anemia from her baseline.  She is not tachycardic or hypotensive.  No need for hospitalization at this time.  Urinalysis does not appear infected.  Will recommend she stop her Eliquis which she takes for atrial fibrillation.  Follow-up with urology by phone today for further instructions and work-up.  I did discuss with her the slight increased risk of stroke with stopping the Eliquis but the risk of continuing Eliquis and significant bleeding is much higher at this time.  Results for orders placed or performed during the hospital encounter of 06/07/20  Lipase, blood  Result Value Ref Range   Lipase 24 11 - 51 U/L  Comprehensive metabolic panel  Result Value Ref Range   Sodium 136 135 - 145 mmol/L   Potassium 4.6 3.5 - 5.1 mmol/L   Chloride 98 98 - 111 mmol/L   CO2 28 22 - 32 mmol/L   Glucose, Bld 95 70 - 99 mg/dL   BUN 24 (H) 8 - 23 mg/dL   Creatinine, Ser 1.08 (H) 0.44 - 1.00 mg/dL   Calcium 9.2 8.9 - 10.3 mg/dL   Total Protein 6.7 6.5 - 8.1 g/dL   Albumin 3.5 3.5 - 5.0 g/dL   AST 30 15 - 41 U/L   ALT 18 0 - 44 U/L   Alkaline Phosphatase 205 (H) 38 - 126 U/L   Total Bilirubin 1.1 0.3 - 1.2 mg/dL   GFR calc non Af Amer 46 (L) >60 mL/min   Anion gap 10 5 - 15  CBC  Result Value Ref Range   WBC 8.0 4.0 - 10.5 K/uL   RBC 3.42 (L) 3.87 - 5.11 MIL/uL   Hemoglobin 10.2 (L) 12.0 - 15.0 g/dL   HCT 33.1 (L) 36 - 46 %   MCV 96.8 80.0 - 100.0 fL   MCH 29.8 26.0 - 34.0 pg   MCHC 30.8 30.0 - 36.0 g/dL   RDW 14.8 11.5 - 15.5 %   Platelets 352 150 - 400 K/uL   nRBC 0.0 0.0 - 0.2 %  Urinalysis, Routine w reflex microscopic  Result Value Ref Range   Color, Urine RED (A) YELLOW   APPearance TURBID (A) CLEAR    Specific Gravity, Urine  1.005 - 1.030    TEST NOT REPORTED DUE TO COLOR INTERFERENCE OF URINE PIGMENT   pH  5.0 - 8.0    TEST NOT REPORTED DUE TO COLOR INTERFERENCE OF URINE PIGMENT   Glucose, UA (A) NEGATIVE mg/dL    TEST NOT REPORTED DUE TO COLOR INTERFERENCE OF URINE PIGMENT   Hgb urine dipstick (A) NEGATIVE    TEST NOT REPORTED DUE TO COLOR INTERFERENCE OF URINE PIGMENT   Bilirubin Urine (A) NEGATIVE    TEST NOT REPORTED DUE TO COLOR INTERFERENCE OF URINE PIGMENT   Ketones, ur (A) NEGATIVE mg/dL    TEST NOT REPORTED DUE TO COLOR INTERFERENCE OF URINE PIGMENT   Protein, ur (A) NEGATIVE mg/dL    TEST NOT REPORTED DUE TO COLOR INTERFERENCE OF URINE PIGMENT   Nitrite (A) NEGATIVE    TEST NOT REPORTED DUE TO COLOR INTERFERENCE OF URINE PIGMENT   Leukocytes,Ua (A) NEGATIVE    TEST NOT REPORTED DUE TO COLOR  INTERFERENCE OF URINE PIGMENT  Urinalysis, Microscopic (reflex)  Result Value Ref Range   RBC / HPF >50 0 - 5 RBC/hpf   WBC, UA 0-5 0 - 5 WBC/hpf   Bacteria, UA NONE SEEN NONE SEEN   Squamous Epithelial / LPF NONE SEEN 0 - 5   CT Renal Stone Study  Result Date: 06/08/2020 CLINICAL DATA:  86 year old female with hematuria. EXAM: CT ABDOMEN AND PELVIS WITHOUT CONTRAST TECHNIQUE: Multidetector CT imaging of the abdomen and pelvis was performed following the standard protocol without IV contrast. COMPARISON:  CT abdomen pelvis dated 02/25/2018. FINDINGS: Evaluation of this exam is limited in the absence of intravenous contrast. Lower chest: The visualized lung bases are clear. No intra-abdominal free air or free fluid. Hepatobiliary: There are multiple hypoenhancing lesions throughout the liver, new since the prior CT most concerning for metastatic disease. The largest lesion in the left lobe of the liver measures approximately 5 x 8 cm. Cholecystectomy. Pancreas: Fatty infiltration of the pancreas. No active inflammatory changes. Spleen: Normal in size without focal abnormality.  Adrenals/Urinary Tract: The adrenal glands are unremarkable. There is high attenuating content within the right renal upper pole collecting system extending into the right renal pelvis which appears to be solid tissue and concerning for neoplasm versus blood clot. This measures approximately 4.2 x 3.4 cm (coronal 59/6). There is mild right hydronephrosis. There is a 4.5 cm exophytic cyst from the upper pole of the right kidney similar to prior CT. There is no hydronephrosis or nephrolithiasis on the left. There is a 2.1 x 2.2 cm rounded lesion along the anterior bladder wall (74/3) concerning for malignancy. Further evaluation with cystoscopy is recommended. Stomach/Bowel: There is moderate stool throughout the colon. There is colonic diverticulosis without active inflammatory changes. There is duodenal diverticulum. There is no bowel obstruction. Appendectomy. Vascular/Lymphatic: Moderate aortoiliac atherosclerotic disease. There is a 2.6 cm infrarenal aortic ectasia. The IVC is unremarkable. No portal venous gas. No adenopathy. Reproductive: Hysterectomy. Other: None Musculoskeletal: Degenerative changes of the spine. No acute osseous pathology. Bilateral L5 pars defects with grade 1 L5-S1 anterolisthesis. IMPRESSION: 1. High attenuating content within the right renal upper pole collecting system extending into the right renal pelvis concerning for neoplasm versus blood clot. There is mild right hydronephrosis. 2. A 2.1 x 2.2 cm rounded lesion along the anterior bladder wall most concerning for malignancy. Further evaluation with cystoscopy is recommended. 3. Multiple hepatic metastatic lesions. 4. Colonic diverticulosis. No bowel obstruction. 5. Aortic Atherosclerosis (ICD10-I70.0). Electronically Signed   By: Anner Crete M.D.   On: 06/08/2020 00:06      Orpah Greek, MD 06/08/20 [phone]

## 2020-06-09 LAB — URINE CULTURE: Culture: NO GROWTH

## 2020-06-10 DIAGNOSIS — J449 Chronic obstructive pulmonary disease, unspecified: Secondary | ICD-10-CM | POA: Diagnosis not present

## 2020-06-11 DIAGNOSIS — D414 Neoplasm of uncertain behavior of bladder: Secondary | ICD-10-CM | POA: Diagnosis not present

## 2020-06-11 DIAGNOSIS — C651 Malignant neoplasm of right renal pelvis: Secondary | ICD-10-CM | POA: Diagnosis not present

## 2020-06-11 NOTE — Progress Notes (Deleted)
Subjective:    Patient ID: [name] Case, female    DOB: [birth year], 86 y.o.   MRN: [id number]  HPI The patient is here for an acute visit.   Gross hematuria - she was in the ED 10/7 for this.  The urine did not look infected.  She has a h/o of bladder cancer and a CT did show likely recurrence.  Anemia is at a baseline.  She was advised to stop her eliquis ( afib).  She has an appt with urology.     Medications and allergies reviewed with patient and updated if appropriate.  Patient Active Problem List   Diagnosis Date Noted  . Gross hematuria 03/02/2020  . Candidal skin infection 03/02/2020  . Degenerative arthritis of right knee 11/09/2019  . COPD with acute exacerbation ([place]) 07/30/2018  . Lynch syndrome 05/05/2018  . Genetic testing 04/28/2018  . Adjustment disorder 04/23/2018  . Urothelial cancer ([place]) 04/12/2018  . Lichen planus 72/53/6644  . Obesity hypoventilation syndrome ([place])   . Hypokalemia   . Chronic diastolic (congestive) heart failure ([place]) 01/09/2016  . History of snoring 05/20/2015  . Chronic respiratory failure with hypoxia ([place])   . Type 2 diabetes mellitus without complication ([place])   . Atrial fibrillation ([place]) 01/06/2015  . Hypothyroidism 03/09/2007  . Hyperlipidemia 03/09/2007  . Morbid obesity ([place]) 03/09/2007  . Essential hypertension 03/09/2007  . ALLERGIC RHINITIS 03/09/2007  . Asthma 03/09/2007  . GERD 03/09/2007  . INCONTINENCE 03/09/2007    Current Outpatient Medications on File Prior to Visit  Medication Sig Dispense Refill  . acetaminophen (TYLENOL) 500 MG tablet Take 500-1,000 mg by mouth every 8 (eight) hours as needed for moderate pain or headache.     . AMBULATORY NON FORMULARY MEDICATION Rolator 1 Units 0  . atorvastatin (LIPITOR) 20 MG tablet TAKE 1 TABLET BY MOUTH DAILY AT 6 PM 90 tablet 1  . diltiazem (CARDIZEM CD) 180 MG 24 hr capsule TAKE 1 CAPSULE BY MOUTH EVERY DAY 90 capsule 1  . ELIQUIS 5 MG TABS tablet TAKE 1  TABLET BY MOUTH TWICE A DAY 60 tablet 5  . furosemide (LASIX) 40 MG tablet TAKE 2 TABLETS BY MOUTH EVERY DAY 180 tablet 3  . ipratropium (ATROVENT) 0.02 % nebulizer solution Take 5 mLs (1 mg total) by nebulization every 6 (six) hours as needed for wheezing or shortness of breath. 75 mL 12  . irbesartan (AVAPRO) 150 MG tablet TAKE 1/2 TABLET BY MOUTH EVERY DAY 45 tablet 5  . KLOR-CON M20 20 MEQ tablet TAKE 1 TABLET BY MOUTH EVERY DAY 90 tablet 1  . levothyroxine (SYNTHROID) 100 MCG tablet TAKE 1 TABLET BY MOUTH EVERY DAY 90 tablet 1  . metoprolol succinate (TOPROL-XL) 50 MG 24 hr tablet TAKE 1 TABLET BY MOUTH DAILY WITH OR IMMEDIATELY FOLLOWING A MEAL. 90 tablet 3  . mirtazapine (REMERON) 15 MG tablet Take 1 tablet (15 mg total) by mouth at bedtime. 90 tablet 1  . nystatin ointment (MYCOSTATIN) Apply 1 application topically 2 (two) times daily. 100 g 3  . ondansetron (ZOFRAN) 4 MG tablet Take 1 tablet (4 mg total) by mouth every 8 (eight) hours as needed for up to 15 doses for nausea or vomiting. 15 tablet 0  . triamcinolone cream (KENALOG) 0.1 % Apply 1 application topically 2 (two) times daily. 453.6 g 11   No current facility-administered medications on file prior to visit.    Past Medical History:  Diagnosis Date  . Allergy   .  Arthritis   . Atrial fibrillation ([place])   . Bronchitis   . Cancer ([place])    Bilateral upper tract transitional cell carcinoma  . Cardiomegaly   . CHF (congestive heart failure) ([place])   . Complication of anesthesia    Difficult to arouse  . COPD (chronic obstructive pulmonary disease) ([facility])    borderline   pt. denies at preop  . Diabetes mellitus without complication ([place])    Controlled by diet and exercise  . Diverticulosis of sigmoid colon   . Dyspnea    at times  . Dysrhythmia    a fib  . Family history of bladder cancer   . Family history of colon cancer   . Family history of kidney cancer   . Fatty liver   . GERD (gastroesophageal reflux disease)    . History of hiatal hernia 01/20/2015   Small sliding, noted on CT  . History of kidney stones   . Hyperlipidemia   . Hypertension   . Hypothyroidism   . Lynch syndrome   . PONV (postoperative nausea and vomiting)   . Supraumbilical hernia 46/56/8127   Small noted on CT  . Thyroid disease   . UTI (urinary tract infection)     Past Surgical History:  Procedure Laterality Date  . ABDOMINAL HYSTERECTOMY    . APPENDECTOMY    . CHOLECYSTECTOMY    . COLONOSCOPY  07/16/2009  . CYSTOSCOPY W/ URETERAL STENT PLACEMENT Right 12/04/2017   Procedure: CYSTOSCOPY WITH RETROGRADE PYELOGRAM/URETERAL STENT PLACEMENT;  Surgeon: Festus Aloe, MD;  Location: [facility];  Service: Urology;  Laterality: Right;  . CYSTOSCOPY W/ URETERAL STENT PLACEMENT Right 02/26/2018   Procedure: CYSTOSCOPY WITH RETROGRADE PYELOGRAM/URETERAL STENT PLACEMENT;  Surgeon: Kathie Rhodes, MD;  Location: [facility];  Service: Urology;  Laterality: Right;  . CYSTOSCOPY WITH URETEROSCOPY AND STENT PLACEMENT Bilateral 01/29/2018   Procedure: BILATERAL URETEROSCOPY AND TUMOR RESECTION  WITH LASER RIGHT STENT EXCHANGE LEFT STENT PLACEMENT;  Surgeon: Ardis Hughs, MD;  [facility];  Service: Urology;  Laterality: Bilateral;  . CYSTOSCOPY WITH URETEROSCOPY AND STENT PLACEMENT Bilateral 02/10/2018   Procedure: BILATERAL URETEROSCOPY AND TUMOR EXCISION BILATERAL STENT EXCHANGE;  Surgeon: Ardis Hughs, MD;  [facility];  Service: Urology;  Laterality: Bilateral;  . CYSTOSCOPY/RETROGRADE/URETEROSCOPY Bilateral 12/24/2017   Procedure: BILATERAL URETEROSCOPY, RIGHT STENT EXCHANGE, STONE EXTRACTION WITH BASKET BILATERAL RETROGRADE PYELOGRAM, BIOPSY OF RIGHT UPPER POLE TUMOR,  BILATERAL RENAL PELVIS FLUID SENT FOR CYTOLOGY;  Surgeon: Ardis Hughs, MD;  [facility];  Service: Urology;  Laterality: Bilateral;  . CYSTOSCOPY/URETEROSCOPY/HOLMIUM LASER/STENT PLACEMENT Bilateral 06/03/2018   Procedure: CYSTOSCOPY/BILATERAL  URETEROSCOPY/ LASER TUMOR ABLATION BILATERAL / BILATERAL STENT PLACEMENT/BILATERAL RETROGRADE PYELOGRAM/BLADDER BIOPSY WITH FULGURATION;  Surgeon: Ardis Hughs, MD;  [facility];  Service: Urology;  Laterality: Bilateral;  . HOLMIUM LASER APPLICATION Bilateral 51/03/16   Procedure: HOLMIUM LASER APPLICATION;  Surgeon: Ardis Hughs, MD;  [facility];  Service: Urology;  Laterality: Bilateral;  . KNEE SURGERY Right   . KNEE SURGERY Left    torn ligament repair  . UPPER GI ENDOSCOPY    . ureteroscopy laser tumor ablation bilateral stent placement     Dr. [place] Meckel 06-03-18     Social History   Socioeconomic History  . Marital status: Widowed    Spouse name: Not on file  . Number of children: Not on file  . Years of education: Not on file  . Highest education level: Not on file  Occupational History  .  Occupation: retired  Tobacco Use  . Smoking status: Never Smoker  . Smokeless tobacco: Never Used  Vaping Use  . Vaping Use: Never used  Substance and Sexual Activity  . Alcohol use: No    Alcohol/week: 0.0 standard drinks  . Drug use: No  . Sexual activity: Not Currently    Birth control/protection: Surgical  Other Topics Concern  . Not on file  Social History Narrative  . Not on file   Social Determinants of Health   Financial Resource Strain:   . Difficulty of Paying Living Expenses: Not on file  Food Insecurity:   . Worried About Charity fundraiser in the Last Year: Not on file  . Ran Out of Food in the Last Year: Not on file  Transportation Needs:   . Lack of Transportation (Medical): Not on file  . Lack of Transportation (Non-Medical): Not on file  Physical Activity:   . Days of Exercise per Week: Not on file  . Minutes of Exercise per Session: Not on file  Stress:   . Feeling of Stress : Not on file  Social Connections:   . Frequency of Communication with Friends and Family: Not on file  . Frequency of Social Gatherings with Friends and  Family: Not on file  . Attends Religious Services: Not on file  . Active Member of Clubs or Organizations: Not on file  . Attends Archivist Meetings: Not on file  . Marital Status: Not on file    Family History  Problem Relation Age of Onset  . Heart disease Mother   . Colon cancer Mother 72       d. 5  . Tuberculosis Father   . Bladder Cancer Brother        urothelial cancer  . Bladder Cancer Brother        ureter cancer  . Kidney cancer Brother   . Skin cancer Sister   . Cancer Maternal Aunt        NOS  . Colon cancer Other        dx 1st time under 40, second time at 56  . Lung cancer Other   . Muscular dystrophy Other        d. 33  . Testicular cancer Other 24       great nephew  . Alcohol abuse Daughter   . Drug abuse Daughter   . Schizophrenia Daughter   . Colon cancer Grandchild     Review of Systems     Objective:  There were no vitals filed for this visit. BP Readings from Last 3 Encounters:  06/08/20 124/69  03/02/20 128/86  03/02/20 (!) 113/55   Wt Readings from Last 3 Encounters:  06/07/20 220 lb (99.8 kg)  03/02/20 230 lb (104.3 kg)  02/09/20 232 lb (105.2 kg)   There is no height or weight on file to calculate BMI.   Physical Exam       CT Renal Stone Study CLINICAL DATA:  86 year old female with hematuria.  EXAM: CT ABDOMEN AND PELVIS WITHOUT CONTRAST  TECHNIQUE: Multidetector CT imaging of the abdomen and pelvis was performed following the standard protocol without IV contrast.  COMPARISON:  CT abdomen pelvis dated 02/25/2018.  FINDINGS: Evaluation of this exam is limited in the absence of intravenous contrast.  Lower chest: The visualized lung bases are clear.  No intra-abdominal free air or free fluid.  Hepatobiliary: There are multiple hypoenhancing lesions throughout the liver, new since the prior CT  most concerning for metastatic disease. The largest lesion in the left lobe of the liver  measures approximately 5 x 8 cm. Cholecystectomy.  Pancreas: Fatty infiltration of the pancreas. No active inflammatory changes.  Spleen: Normal in size without focal abnormality.  Adrenals/Urinary Tract: The adrenal glands are unremarkable. There is high attenuating content within the right renal upper pole collecting system extending into the right renal pelvis which appears to be solid tissue and concerning for neoplasm versus blood clot. This measures approximately 4.2 x 3.4 cm (coronal 59/6). There is mild right hydronephrosis. There is a 4.5 cm exophytic cyst from the upper pole of the right kidney similar to prior CT. There is no hydronephrosis or nephrolithiasis on the left. There is a 2.1 x 2.2 cm rounded lesion along the anterior bladder wall (74/3) concerning for malignancy. Further evaluation with cystoscopy is recommended.  Stomach/Bowel: There is moderate stool throughout the colon. There is colonic diverticulosis without active inflammatory changes. There is duodenal diverticulum. There is no bowel obstruction. Appendectomy.  Vascular/Lymphatic: Moderate aortoiliac atherosclerotic disease. There is a 2.6 cm infrarenal aortic ectasia. The IVC is unremarkable. No portal venous gas. No adenopathy.  Reproductive: Hysterectomy.  Other: None  Musculoskeletal: Degenerative changes of the spine. No acute osseous pathology. Bilateral L5 pars defects with grade 1 L5-S1 anterolisthesis.  IMPRESSION: 1. High attenuating content within the right renal upper pole collecting system extending into the right renal pelvis concerning for neoplasm versus blood clot. There is mild right hydronephrosis. 2. A 2.1 x 2.2 cm rounded lesion along the anterior bladder wall most concerning for malignancy. Further evaluation with cystoscopy is recommended. 3. Multiple hepatic metastatic lesions. 4. Colonic diverticulosis. No bowel obstruction. 5. Aortic Atherosclerosis  (ICD10-I70.0).  Electronically Signed   By: Anner Crete M.D.   On: 06/08/2020 00:06    Assessment & Plan:    See Problem List for Assessment and Plan of chronic medical problems.    This visit occurred during the SARS-CoV-2 public health emergency.  Safety protocols were in place, including screening questions prior to the visit, additional usage of staff PPE, and extensive cleaning of exam room while observing appropriate contact time as indicated for disinfecting solutions.

## 2020-06-12 ENCOUNTER — Ambulatory Visit: Payer: Medicare Other | Admitting: Internal Medicine

## 2020-06-12 DIAGNOSIS — C651 Malignant neoplasm of right renal pelvis: Secondary | ICD-10-CM | POA: Diagnosis not present

## 2020-06-12 DIAGNOSIS — C787 Secondary malignant neoplasm of liver and intrahepatic bile duct: Secondary | ICD-10-CM | POA: Diagnosis not present

## 2020-06-19 NOTE — Progress Notes (Signed)
Virtual Visit via Video Note   This visit type was conducted due to national recommendations for restrictions regarding the COVID-19 Pandemic (e.g. social distancing) in an effort to limit this patient's exposure and mitigate transmission in our community.  Due to her co-morbid illnesses, this patient is at least at moderate risk for complications without adequate follow up.  This format is felt to be most appropriate for this patient at this time.  All issues noted in this document were discussed and addressed.  A limited physical exam was performed with this format.  Please refer to the patient's chart for her consent to telehealth for [facility].       Date:  06/20/2020   ID:  [name] Case, DOB [birth year], MRN [id number] The patient was identified using 2 identifiers.  Patient Location: Home Provider Location: Home Office  PCP:  Hoyt Koch, MD  Cardiologist:  Candee Furbish, MD  Electrophysiologist:  None   Evaluation Performed:  Follow-Up Visit  Chief Complaint: Follow-up, hematuria  History of Present Illness:    [name] Case is a 86 y.o. female with a history of permanent atrial fibrillation, hypertension, hyperlipidemia, chronic diastolic heart failure, COPD and prior history of bladder cancer.   [name] was last seen by Dr. Marlou Porch 12/15/2019 and overall was noted to be stable.  She was continued on medications with no change.  She is anticoagulated with Eliquis.  Unfortunately, she was seen in the emergency department 06/07/2020 with gross hematuria with no pain. Patient reported having blood in her urine at diagnosis of prior bladder cancer approximately 3 years ago for which she was followed [facility].  CT scan performed which showed multiple hypoenhancing lesions throughout the liver, new since prior CT concerning for metastatic disease with the largest liver lesion measuring 5 x 8 cm evidence.  Eliquis was stopped at that time with follow-up  with urology for further instructions.  Increased stroke risk was discussed by ED physician, family and patient.  Today I saw [name] with the assistance of her daughter.  Long discussion about current situation as described above.  They followed up with urology who confirmed metastatic disease.  Plan is to continue off Eliquis after weighing the risks versus benefits.  She prefers no further invasive/aggressive therapies, current DNR.  She is currently being followed by palliative care medicine and wishes for comfort measures only.  She is in good spirits today with no specific complaints.  Has had no palpitations.  She denies pain.  Will notify primary cardiologist of her situation.   The patient does not have symptoms concerning for COVID-19 infection (fever, chills, cough, or new shortness of breath).    Past Medical History:  Diagnosis Date  . Allergy   . Arthritis   . Atrial fibrillation ([place])   . Bronchitis   . Cancer ([place])    Bilateral upper tract transitional cell carcinoma  . Cardiomegaly   . CHF (congestive heart failure) ([place])   . Complication of anesthesia    Difficult to arouse  . COPD (chronic obstructive pulmonary disease) ([facility])    borderline   pt. denies at preop  . Diabetes mellitus without complication ([place])    Controlled by diet and exercise  . Diverticulosis of sigmoid colon   . Dyspnea    at times  . Dysrhythmia    a fib  . Family history of bladder cancer   . Family history of colon cancer   . Family history of  kidney cancer   . Fatty liver   . GERD (gastroesophageal reflux disease)   . History of hiatal hernia 01/20/2015   Small sliding, noted on CT  . History of kidney stones   . Hyperlipidemia   . Hypertension   . Hypothyroidism   . Lynch syndrome   . PONV (postoperative nausea and vomiting)   . Supraumbilical hernia 38/75/6433   Small noted on CT  . Thyroid disease   . UTI (urinary tract infection)    Past Surgical History:  Procedure  Laterality Date  . ABDOMINAL HYSTERECTOMY    . APPENDECTOMY    . CHOLECYSTECTOMY    . COLONOSCOPY  07/16/2009  . CYSTOSCOPY W/ URETERAL STENT PLACEMENT Right 12/04/2017   Procedure: CYSTOSCOPY WITH RETROGRADE PYELOGRAM/URETERAL STENT PLACEMENT;  Surgeon: Festus Aloe, MD;  [facility];  Service: Urology;  Laterality: Right;  . CYSTOSCOPY W/ URETERAL STENT PLACEMENT Right 02/26/2018   Procedure: CYSTOSCOPY WITH RETROGRADE PYELOGRAM/URETERAL STENT PLACEMENT;  Surgeon: Kathie Rhodes, MD;  [facility];  Service: Urology;  Laterality: Right;  . CYSTOSCOPY WITH URETEROSCOPY AND STENT PLACEMENT Bilateral 01/29/2018   Procedure: BILATERAL URETEROSCOPY AND TUMOR RESECTION  WITH LASER RIGHT STENT EXCHANGE LEFT STENT PLACEMENT;  Surgeon: Ardis Hughs, MD;  [facility];  Service: Urology;  Laterality: Bilateral;  . CYSTOSCOPY WITH URETEROSCOPY AND STENT PLACEMENT Bilateral 02/10/2018   Procedure: BILATERAL URETEROSCOPY AND TUMOR EXCISION BILATERAL STENT EXCHANGE;  Surgeon: Ardis Hughs, MD;  [facility];  Service: Urology;  Laterality: Bilateral;  . CYSTOSCOPY/RETROGRADE/URETEROSCOPY Bilateral 12/24/2017   Procedure: BILATERAL URETEROSCOPY, RIGHT STENT EXCHANGE, STONE EXTRACTION WITH BASKET BILATERAL RETROGRADE PYELOGRAM, BIOPSY OF RIGHT UPPER POLE TUMOR,  BILATERAL RENAL PELVIS FLUID SENT FOR CYTOLOGY;  Surgeon: Ardis Hughs, MD;  [facility];  Service: Urology;  Laterality: Bilateral;  . CYSTOSCOPY/URETEROSCOPY/HOLMIUM LASER/STENT PLACEMENT Bilateral 06/03/2018   Procedure: CYSTOSCOPY/BILATERAL URETEROSCOPY/ LASER TUMOR ABLATION BILATERAL / BILATERAL STENT PLACEMENT/BILATERAL RETROGRADE PYELOGRAM/BLADDER BIOPSY WITH FULGURATION;  Surgeon: Ardis Hughs, MD;  [facility];  Service: Urology;  Laterality: Bilateral;  . HOLMIUM LASER APPLICATION Bilateral 29/12/1882   Procedure: HOLMIUM LASER APPLICATION;  Surgeon: Ardis Hughs, MD;  [facility];   Service: Urology;  Laterality: Bilateral;  . KNEE SURGERY Right   . KNEE SURGERY Left    torn ligament repair  . UPPER GI ENDOSCOPY    . ureteroscopy laser tumor ablation bilateral stent placement     Dr. Louis Meckel 06-03-18      Current Meds  Medication Sig  . acetaminophen (TYLENOL) 500 MG tablet Take 500-1,000 mg by mouth every 8 (eight) hours as needed for moderate pain or headache.   . AMBULATORY NON FORMULARY MEDICATION Rolator  . diltiazem (CARDIZEM CD) 180 MG 24 hr capsule TAKE 1 CAPSULE BY MOUTH EVERY DAY  . furosemide (LASIX) 40 MG tablet Take 120 mg by mouth daily.  [name] ipratropium (ATROVENT) 0.02 % nebulizer solution Take 5 mLs (1 mg total) by nebulization every 6 (six) hours as needed for wheezing or shortness of breath.  . irbesartan (AVAPRO) 150 MG tablet TAKE 1/2 TABLET BY MOUTH EVERY DAY  . KLOR-CON M20 20 MEQ tablet TAKE 1 TABLET BY MOUTH EVERY DAY  . levothyroxine (SYNTHROID) 100 MCG tablet TAKE 1 TABLET BY MOUTH EVERY DAY  . metoprolol succinate (TOPROL-XL) 50 MG 24 hr tablet TAKE 1 TABLET BY MOUTH DAILY WITH OR IMMEDIATELY FOLLOWING A MEAL.  [name] nystatin ointment (MYCOSTATIN) Apply 1 application topically 2 (two) times daily.  [name]  triamcinolone cream (KENALOG) 0.1 % Apply 1 application topically 2 (two) times daily.     Allergies:   Codeine   Social History   Tobacco Use  . Smoking status: Never Smoker  . Smokeless tobacco: Never Used  Vaping Use  . Vaping Use: Never used  Substance Use Topics  . Alcohol use: No    Alcohol/week: 0.0 standard drinks  . Drug use: No     Family Hx: The patient's family history includes Alcohol abuse in her daughter; Bladder Cancer in her brother and brother; Cancer in her maternal aunt; Colon cancer in her grandchild and another family member; Colon cancer (age of onset: 37) in her mother; Drug abuse in her daughter; Heart disease in her mother; Kidney cancer in her brother; Lung cancer in an other family member; Muscular dystrophy in  an other family member; Schizophrenia in her daughter; Skin cancer in her sister; Testicular cancer (age of onset: 95) in an other family member; Tuberculosis in her father.  ROS:   Please see the history of present illness.     All other systems reviewed and are negative.   Labs/Other Tests and Data Reviewed:    EKG:  No ECG reviewed.  Recent Labs: 06/07/2020: ALT 18; BUN 24; Creatinine, Ser 1.08; Hemoglobin 10.2; Platelets 352; Potassium 4.6; Sodium 136   Recent Lipid Panel Lab Results  Component Value Date/Time   CHOL 146 12/15/2019 10:51 AM   TRIG 105 12/15/2019 10:51 AM   HDL 41 12/15/2019 10:51 AM   CHOLHDL 3.6 12/15/2019 10:51 AM   CHOLHDL 4 06/08/2015 11:26 AM   LDLCALC 86 12/15/2019 10:51 AM   LDLDIRECT 188.6 07/19/2007 12:00 AM    Wt Readings from Last 3 Encounters:  06/20/20 220 lb (99.8 kg)  06/07/20 220 lb (99.8 kg)  03/02/20 230 lb (104.3 kg)      Objective:    Vital Signs:  BP (!) 90/54   Pulse 83   Ht 5' (1.524 m)   Wt 220 lb (99.8 kg)   LMP  (LMP Unknown)   SpO2 98%   BMI 42.97 kg/m    VITAL SIGNS:  reviewed GEN:  no acute distress RESPIRATORY:  normal respiratory effort, symmetric expansion NEURO:  alert and oriented x 3, no obvious focal deficit PSYCH:  normal affect  ASSESSMENT & PLAN:    1.  Permanent atrial fibrillation: -Rate controlled with no complaints of palpitations -Not currently on anticoagulation secondary to new diagnosis of metastatic liver cancer>>> seen in the ED due to gross hematuria -Follow-up with urology who confirmed DX>> plan for no aggressive measures at this time, currently being followed by palliative medicine  2.  Metastatic liver cancer: -Initially diagnosed with bladder cancer approximately 3 years ago and presented to the ED 06/07/2020 with gross hematuria found to have multiple hypoenhancing lesions throughout the liver concerning for metastatic disease with the largest liver lesion measuring 5 x 8 cm -As  above, confirmed by urology with no plans for further invasive measures including chemotherapy/radiation/surgical intervention -Prefers DNR, palliative measures/comfort measures only  3.  HTN: -Continue current regimen  4.  HLD: -Continue atorvastatin  5.  COPD: -On supplemental home O2 -Denies shortness of breath  6.  Chronic diastolic heart failure: -No overt s/s of fluid volume overload on video visit -Maintained with Lasix   COVID-19 Education: The signs and symptoms of COVID-19 were discussed with the patient and how to seek care for testing (follow up with PCP or arrange E-visit). The importance of  social distancing was discussed today.  Time:   Today, I have spent 20 minutes with the patient with telehealth technology discussing the above problems.     Medication Adjustments/Labs and Tests Ordered: Current medicines are reviewed at length with the patient today.  Concerns regarding medicines are outlined above.   Tests Ordered: No orders of the defined types were placed in this encounter.   Medication Changes: No orders of the defined types were placed in this encounter.   Follow Up:  Virtual Visit  Dr. Marlou Porch in 6 months  Signed, Kathyrn Drown, NP  06/20/2020 11:56 AM    [place]

## 2020-06-20 ENCOUNTER — Telehealth (INDEPENDENT_AMBULATORY_CARE_PROVIDER_SITE_OTHER): Payer: Medicare Other | Admitting: Cardiology

## 2020-06-20 ENCOUNTER — Encounter: Payer: Self-pay | Admitting: Cardiology

## 2020-06-20 ENCOUNTER — Other Ambulatory Visit: Payer: Self-pay | Admitting: Internal Medicine

## 2020-06-20 ENCOUNTER — Other Ambulatory Visit: Payer: Self-pay

## 2020-06-20 VITALS — BP 90/54 | HR 83 | Ht 60.0 in | Wt 220.0 lb

## 2020-06-20 DIAGNOSIS — C787 Secondary malignant neoplasm of liver and intrahepatic bile duct: Secondary | ICD-10-CM

## 2020-06-20 DIAGNOSIS — I4821 Permanent atrial fibrillation: Secondary | ICD-10-CM | POA: Diagnosis not present

## 2020-06-20 DIAGNOSIS — E785 Hyperlipidemia, unspecified: Secondary | ICD-10-CM | POA: Diagnosis not present

## 2020-06-20 DIAGNOSIS — J449 Chronic obstructive pulmonary disease, unspecified: Secondary | ICD-10-CM | POA: Diagnosis not present

## 2020-06-20 DIAGNOSIS — I10 Essential (primary) hypertension: Secondary | ICD-10-CM | POA: Diagnosis not present

## 2020-06-20 NOTE — Patient Instructions (Signed)
Medication Instructions:  Your physician recommends that you continue on your current medications as directed. Please refer to the Current Medication list given to you today. *If you need a refill on your cardiac medications before your next appointment, please call your pharmacy*   Lab Work: None If you have labs (blood work) drawn today and your tests are completely normal, you will receive your results only by: . MyChart Message (if you have MyChart) OR . A paper copy in the mail If you have any lab test that is abnormal or we need to change your treatment, we will call you to review the results.   Testing/Procedures: None   Follow-Up: At [facility], you and your health needs are our priority.  As part of our continuing mission to provide you with exceptional heart care, we have created designated Provider Care Teams.  These Care Teams include your primary Cardiologist (physician) and Advanced Practice Providers (APPs -  Physician Assistants and Nurse Practitioners) who all work together to provide you with the care you need, when you need it.   Your next appointment:   6 month(s)  The format for your next appointment:   Virtual Visit   Provider:   Mark Skains, MD    

## 2020-07-02 DIAGNOSIS — I5041 Acute combined systolic (congestive) and diastolic (congestive) heart failure: Secondary | ICD-10-CM | POA: Diagnosis not present

## 2020-07-02 DIAGNOSIS — J441 Chronic obstructive pulmonary disease with (acute) exacerbation: Secondary | ICD-10-CM | POA: Diagnosis not present

## 2020-07-11 DIAGNOSIS — J449 Chronic obstructive pulmonary disease, unspecified: Secondary | ICD-10-CM | POA: Diagnosis not present

## 2020-08-01 DIAGNOSIS — I5041 Acute combined systolic (congestive) and diastolic (congestive) heart failure: Secondary | ICD-10-CM | POA: Diagnosis not present

## 2020-08-01 DIAGNOSIS — J441 Chronic obstructive pulmonary disease with (acute) exacerbation: Secondary | ICD-10-CM | POA: Diagnosis not present

## 2020-08-10 DIAGNOSIS — J449 Chronic obstructive pulmonary disease, unspecified: Secondary | ICD-10-CM | POA: Diagnosis not present

## 2020-09-01 DIAGNOSIS — J441 Chronic obstructive pulmonary disease with (acute) exacerbation: Secondary | ICD-10-CM | POA: Diagnosis not present

## 2020-09-01 DIAGNOSIS — I5041 Acute combined systolic (congestive) and diastolic (congestive) heart failure: Secondary | ICD-10-CM | POA: Diagnosis not present

## 2020-09-02 ENCOUNTER — Observation Stay (HOSPITAL_COMMUNITY)
Admission: EM | Admit: 2020-09-02 | Discharge: 2020-09-03 | Disposition: A | Discharge status: Home / Self Care | Payer: Medicare Other | Attending: Internal Medicine | Admitting: Internal Medicine

## 2020-09-02 ENCOUNTER — Encounter (HOSPITAL_COMMUNITY): Payer: Self-pay

## 2020-09-02 ENCOUNTER — Other Ambulatory Visit: Payer: Self-pay

## 2020-09-02 ENCOUNTER — Emergency Department (HOSPITAL_COMMUNITY): Payer: Medicare Other

## 2020-09-02 DIAGNOSIS — Z79899 Other long term (current) drug therapy: Secondary | ICD-10-CM | POA: Insufficient documentation

## 2020-09-02 DIAGNOSIS — Z20822 Contact with and (suspected) exposure to covid-19: Secondary | ICD-10-CM | POA: Insufficient documentation

## 2020-09-02 DIAGNOSIS — E785 Hyperlipidemia, unspecified: Secondary | ICD-10-CM | POA: Diagnosis present

## 2020-09-02 DIAGNOSIS — J9611 Chronic respiratory failure with hypoxia: Secondary | ICD-10-CM | POA: Diagnosis not present

## 2020-09-02 DIAGNOSIS — I110 Hypertensive heart disease with heart failure: Secondary | ICD-10-CM | POA: Diagnosis not present

## 2020-09-02 DIAGNOSIS — I4891 Unspecified atrial fibrillation: Secondary | ICD-10-CM | POA: Insufficient documentation

## 2020-09-02 DIAGNOSIS — I10 Essential (primary) hypertension: Secondary | ICD-10-CM | POA: Diagnosis present

## 2020-09-02 DIAGNOSIS — C7911 Secondary malignant neoplasm of bladder: Secondary | ICD-10-CM | POA: Insufficient documentation

## 2020-09-02 DIAGNOSIS — I5032 Chronic diastolic (congestive) heart failure: Secondary | ICD-10-CM | POA: Diagnosis not present

## 2020-09-02 DIAGNOSIS — E039 Hypothyroidism, unspecified: Secondary | ICD-10-CM | POA: Diagnosis not present

## 2020-09-02 DIAGNOSIS — E662 Morbid (severe) obesity with alveolar hypoventilation: Secondary | ICD-10-CM | POA: Diagnosis present

## 2020-09-02 DIAGNOSIS — B349 Viral infection, unspecified: Secondary | ICD-10-CM | POA: Diagnosis not present

## 2020-09-02 DIAGNOSIS — R062 Wheezing: Secondary | ICD-10-CM | POA: Diagnosis not present

## 2020-09-02 DIAGNOSIS — D649 Anemia, unspecified: Secondary | ICD-10-CM

## 2020-09-02 DIAGNOSIS — R0689 Other abnormalities of breathing: Secondary | ICD-10-CM | POA: Diagnosis not present

## 2020-09-02 DIAGNOSIS — C689 Malignant neoplasm of urinary organ, unspecified: Secondary | ICD-10-CM | POA: Diagnosis present

## 2020-09-02 DIAGNOSIS — J069 Acute upper respiratory infection, unspecified: Secondary | ICD-10-CM | POA: Diagnosis not present

## 2020-09-02 DIAGNOSIS — E119 Type 2 diabetes mellitus without complications: Secondary | ICD-10-CM | POA: Insufficient documentation

## 2020-09-02 DIAGNOSIS — Z85528 Personal history of other malignant neoplasm of kidney: Secondary | ICD-10-CM | POA: Insufficient documentation

## 2020-09-02 DIAGNOSIS — R0602 Shortness of breath: Secondary | ICD-10-CM | POA: Diagnosis not present

## 2020-09-02 DIAGNOSIS — J449 Chronic obstructive pulmonary disease, unspecified: Secondary | ICD-10-CM | POA: Diagnosis not present

## 2020-09-02 LAB — CBC
HCT: 22.4 % — ABNORMAL LOW (ref 36.0–46.0)
Hemoglobin: 6.8 g/dL — CL (ref 12.0–15.0)
MCH: 28 pg (ref 26.0–34.0)
MCHC: 30.4 g/dL (ref 30.0–36.0)
MCV: 92.2 fL (ref 80.0–100.0)
Platelets: 374 10*3/uL (ref 150–400)
RBC: 2.43 MIL/uL — ABNORMAL LOW (ref 3.87–5.11)
RDW: 15.7 % — ABNORMAL HIGH (ref 11.5–15.5)
WBC: 6.7 10*3/uL (ref 4.0–10.5)
nRBC: 0 % (ref 0.0–0.2)

## 2020-09-02 LAB — BASIC METABOLIC PANEL
Anion gap: 9 (ref 5–15)
BUN: 21 mg/dL (ref 8–23)
CO2: 25 mmol/L (ref 22–32)
Calcium: 8.6 mg/dL — ABNORMAL LOW (ref 8.9–10.3)
Chloride: 104 mmol/L (ref 98–111)
Creatinine, Ser: 1.38 mg/dL — ABNORMAL HIGH (ref 0.44–1.00)
GFR, Estimated: 37 mL/min — ABNORMAL LOW (ref >60)
Glucose, Bld: 119 mg/dL — ABNORMAL HIGH (ref 70–99)
Potassium: 3.9 mmol/L (ref 3.5–5.1)
Sodium: 138 mmol/L (ref 135–145)

## 2020-09-02 LAB — RESP PANEL BY RT-PCR (FLU A&B, COVID) ARPGX2
Influenza A by PCR: NEGATIVE
Influenza B by PCR: NEGATIVE
SARS Coronavirus 2 by RT PCR: NEGATIVE

## 2020-09-02 LAB — BRAIN NATRIURETIC PEPTIDE: B Natriuretic Peptide: 248.3 pg/mL — ABNORMAL HIGH (ref 0.0–100.0)

## 2020-09-02 LAB — TROPONIN I (HIGH SENSITIVITY)
Troponin I (High Sensitivity): 12 ng/L (ref <18)
Troponin I (High Sensitivity): 12 ng/L (ref <18)

## 2020-09-02 LAB — PREPARE RBC (CROSSMATCH)

## 2020-09-02 MED ORDER — FUROSEMIDE 20 MG PO TABS
80.0000 mg | ORAL_TABLET | Freq: Every morning | ORAL | Status: DC
  Administered 2020-09-03: 80 mg via ORAL
  Filled 2020-09-02: qty 4

## 2020-09-02 MED ORDER — IPRATROPIUM-ALBUTEROL 20-100 MCG/ACT IN AERS
1.0000 | INHALATION_SPRAY | Freq: Four times a day (QID) | RESPIRATORY_TRACT | Status: DC
  Administered 2020-09-02 – 2020-09-03 (×3): 1 via RESPIRATORY_TRACT
  Filled 2020-09-02: qty 4

## 2020-09-02 MED ORDER — ALBUTEROL SULFATE HFA 108 (90 BASE) MCG/ACT IN AERS
2.0000 | INHALATION_SPRAY | RESPIRATORY_TRACT | Status: DC | PRN
  Administered 2020-09-03: 2 via RESPIRATORY_TRACT
  Filled 2020-09-02: qty 6.7

## 2020-09-02 MED ORDER — ATORVASTATIN CALCIUM 10 MG PO TABS
20.0000 mg | ORAL_TABLET | Freq: Every evening | ORAL | Status: DC
  Administered 2020-09-02: 20 mg via ORAL
  Filled 2020-09-02: qty 2

## 2020-09-02 MED ORDER — DILTIAZEM HCL ER COATED BEADS 180 MG PO CP24
180.0000 mg | ORAL_CAPSULE | Freq: Every day | ORAL | Status: DC
Start: 2020-09-02 — End: 2020-09-03
  Administered 2020-09-02 – 2020-09-03 (×2): 180 mg via ORAL
  Filled 2020-09-02 (×3): qty 1

## 2020-09-02 MED ORDER — METOPROLOL SUCCINATE ER 25 MG PO TB24
50.0000 mg | ORAL_TABLET | Freq: Every day | ORAL | Status: DC
  Administered 2020-09-03: 50 mg via ORAL
  Filled 2020-09-02: qty 2

## 2020-09-02 MED ORDER — CALCIUM CARBONATE ANTACID 500 MG PO CHEW: 750.0000 mg | CHEWABLE_TABLET | ORAL | Status: DC | PRN

## 2020-09-02 MED ORDER — OXYCODONE HCL 5 MG PO TABS
5.0000 mg | ORAL_TABLET | ORAL | Status: DC | PRN
Start: 2020-09-02 — End: 2020-09-03

## 2020-09-02 MED ORDER — ENOXAPARIN SODIUM 40 MG/0.4ML ~~LOC~~ SOLN: 40.0000 mg | SUBCUTANEOUS | Status: DC

## 2020-09-02 MED ORDER — POTASSIUM CHLORIDE CRYS ER 20 MEQ PO TBCR
20.0000 meq | EXTENDED_RELEASE_TABLET | Freq: Every day | ORAL | Status: DC
  Administered 2020-09-03: 20 meq via ORAL
  Filled 2020-09-02: qty 1

## 2020-09-02 MED ORDER — PREDNISONE 20 MG PO TABS
40.0000 mg | ORAL_TABLET | Freq: Every day | ORAL | Status: DC
  Filled 2020-09-02: qty 2

## 2020-09-02 MED ORDER — SODIUM CHLORIDE 0.9% IV SOLUTION: Freq: Once | INTRAVENOUS | Status: DC

## 2020-09-02 MED ORDER — LACTATED RINGERS IV SOLN: INTRAVENOUS | Status: DC

## 2020-09-02 MED ORDER — METHYLPREDNISOLONE SODIUM SUCC 125 MG IJ SOLR
60.0000 mg | Freq: Two times a day (BID) | INTRAMUSCULAR | Status: AC
Start: 2020-09-02 — End: 2020-09-03
  Administered 2020-09-02 – 2020-09-03 (×2): 60 mg via INTRAVENOUS
  Filled 2020-09-02 (×2): qty 2

## 2020-09-02 MED ORDER — LORAZEPAM 1 MG PO TABS
0.5000 mg | ORAL_TABLET | ORAL | Status: DC | PRN
  Administered 2020-09-03: 0.5 mg via ORAL
  Filled 2020-09-02: qty 1

## 2020-09-02 MED ORDER — LEVOTHYROXINE SODIUM 100 MCG PO TABS
100.0000 ug | ORAL_TABLET | Freq: Every day | ORAL | Status: DC
  Administered 2020-09-03: 100 ug via ORAL
  Filled 2020-09-02: qty 1

## 2020-09-02 NOTE — H&P (Signed)
History and Physical    [place] BRAISTED [id number] DOB: 04-Jul-1932 DOA: 09/02/2020  PCP: Hoyt Koch, MD Consultants:  Marlou Porch - cardiology Patient coming from:  Home - lives with granddaughter, her husband, and her son; NOK:  Lance Bosch, [phone]   Chief Complaint: SOB  HPI: [name] Case is a 87 y.o. female with medical history significant of hypothyroidism;  Lynch syndrome; HTN; HLD; fatty liver; afib not on AC; mild dementia; DM; COPD on 2L home O2; dementia; metastatic bladder cancer on hospice; and chronic diastolic CHF presenting with SOB.  She had difficulty breathing this AM.  She was SOB last night and didn't sleep well.  She was very SOB upon awakening this AM.  A couple of of congestion.  She was around her niece who had COVID.  +cough, productive of white phlegm.  No fever.  SOB was present at rest today.  She feels better now after breathing treatments.  For several days she was not feeling well.  She is not noticing any blood in her urine in the last few months.  She has not noticed any bleeding.  She has never required a transfusion before.  She has metastatic cancer to her liver, not receiving treatments.  She was enrolled in hospice and her granddaughter didn't want them to come over; she wants this again.    ED Course:  Metastatic bladder cancer - family fired hospice on Friday, want a different agency.  SOB, wheezing, transfusing 1 unit PRBC - maybe slowly losing in urine and finally decompensated.   COVID negative, had exposure at home, thought she might have it.  Review of Systems: As per HPI; otherwise review of systems reviewed and negative.   Ambulatory Status:  Ambulates with a walker  COVID Vaccine Status:  Complete, did not receive booster  Past Medical History:  Diagnosis Date  . Allergy   . Arthritis   . Atrial fibrillation ([place])   . Bronchitis   . Cancer ([place])    Bilateral upper tract transitional cell carcinoma  .  Cardiomegaly   . CHF (congestive heart failure) ([place])   . Complication of anesthesia    Difficult to arouse  . COPD (chronic obstructive pulmonary disease) (HCC)    borderline   pt. denies at preop  . Diabetes mellitus without complication ([place])    Controlled by diet and exercise  . Diverticulosis of sigmoid colon   . Family history of bladder cancer   . Family history of colon cancer   . Family history of kidney cancer   . Fatty liver   . GERD (gastroesophageal reflux disease)   . History of hiatal hernia 01/20/2015   Small sliding, noted on CT  . History of kidney stones   . Hyperlipidemia   . Hypertension   . Hypothyroidism   . Lynch syndrome   . PONV (postoperative nausea and vomiting)   . Supraumbilical hernia 0000000   Small noted on CT    Past Surgical History:  Procedure Laterality Date  . ABDOMINAL HYSTERECTOMY    . APPENDECTOMY    . CHOLECYSTECTOMY    . COLONOSCOPY  07/16/2009  . CYSTOSCOPY W/ URETERAL STENT PLACEMENT Right 12/04/2017   Procedure: CYSTOSCOPY WITH RETROGRADE PYELOGRAM/URETERAL STENT PLACEMENT;  Surgeon: Festus Aloe, MD;  Location: [facility];  Service: Urology;  Laterality: Right;  . CYSTOSCOPY W/ URETERAL STENT PLACEMENT Right 02/26/2018   Procedure: CYSTOSCOPY WITH RETROGRADE PYELOGRAM/URETERAL STENT PLACEMENT;  Surgeon: Kathie Rhodes, MD;  Location: [facility];  Service: Urology;  Laterality: Right;  . CYSTOSCOPY WITH URETEROSCOPY AND STENT PLACEMENT Bilateral 01/29/2018   Procedure: BILATERAL URETEROSCOPY AND TUMOR RESECTION  WITH LASER RIGHT STENT EXCHANGE LEFT STENT PLACEMENT;  Surgeon: Ardis Hughs, MD;  Location: [facility];  Service: Urology;  Laterality: Bilateral;  . CYSTOSCOPY WITH URETEROSCOPY AND STENT PLACEMENT Bilateral 02/10/2018   Procedure: BILATERAL URETEROSCOPY AND TUMOR EXCISION BILATERAL STENT EXCHANGE;  Surgeon: Ardis Hughs, MD;  Location: [facility];  Service: Urology;  Laterality: Bilateral;  .  CYSTOSCOPY/RETROGRADE/URETEROSCOPY Bilateral 12/24/2017   Procedure: BILATERAL URETEROSCOPY, RIGHT STENT EXCHANGE, STONE EXTRACTION WITH BASKET BILATERAL RETROGRADE PYELOGRAM, BIOPSY OF RIGHT UPPER POLE TUMOR,  BILATERAL RENAL PELVIS FLUID SENT FOR CYTOLOGY;  Surgeon: Ardis Hughs, MD;  Location: [facility];  Service: Urology;  Laterality: Bilateral;  . CYSTOSCOPY/URETEROSCOPY/HOLMIUM LASER/STENT PLACEMENT Bilateral 06/03/2018   Procedure: CYSTOSCOPY/BILATERAL URETEROSCOPY/ LASER TUMOR ABLATION BILATERAL / BILATERAL STENT PLACEMENT/BILATERAL RETROGRADE PYELOGRAM/BLADDER BIOPSY WITH FULGURATION;  Surgeon: Ardis Hughs, MD;  Location: [facility];  Service: Urology;  Laterality: Bilateral;  . HOLMIUM LASER APPLICATION Bilateral [id number]   Procedure: HOLMIUM LASER APPLICATION;  Surgeon: Ardis Hughs, MD;  Location: [facility];  Service: Urology;  Laterality: Bilateral;  . KNEE SURGERY Right   . KNEE SURGERY Left    torn ligament repair  . UPPER GI ENDOSCOPY    . ureteroscopy laser tumor ablation bilateral stent placement     Dr. Louis Meckel 06-03-18     Social History   Socioeconomic History  . Marital status: Widowed    Spouse name: Not on file  . Number of children: Not on file  . Years of education: Not on file  . Highest education level: Not on file  Occupational History  . Occupation: retired  Tobacco Use  . Smoking status: Never Smoker  . Smokeless tobacco: Never Used  Vaping Use  . Vaping Use: Never used  Substance and Sexual Activity  . Alcohol use: No    Alcohol/week: 0.0 standard drinks  . Drug use: No  . Sexual activity: Not Currently    Birth control/protection: Surgical  Other Topics Concern  . Not on file  Social History Narrative  . Not on file   Social Determinants of Health   Financial Resource Strain: Not on file  Food Insecurity: Not on file  Transportation Needs: Not on file  Physical Activity: Not on file  Stress: Not on file  Social Connections: Not  on file  Intimate Partner Violence: Not on file    Allergies  Allergen Reactions  . Eliquis [Apixaban] Other (See Comments)    Was told to NOT take because it caused internal bleeding  . Oxycodone Other (See Comments)    Must have anti-nausea medication to tolerate (otherwise, nausea/vomiting)  . Codeine Hives, Rash and Other (See Comments)    In cough syrup preparations    Family History  Problem Relation Age of Onset  . Heart disease Mother   . Colon cancer Mother 63       d. 73  . Tuberculosis Father   . Bladder Cancer Brother        urothelial cancer  . Bladder Cancer Brother        ureter cancer  . Kidney cancer Brother   . Skin cancer Sister   . Cancer Maternal Aunt        NOS  . Colon cancer Other        dx 1st time under 39, second time at 68  . Lung  cancer Other   . Muscular dystrophy Other        d. 59  . Testicular cancer Other 24       great nephew  . Alcohol abuse Daughter   . Drug abuse Daughter   . Schizophrenia Daughter   . Colon cancer Grandchild     Prior to Admission medications   Medication Sig Start Date End Date Taking? Authorizing Provider  acetaminophen (TYLENOL) 500 MG tablet Take 500-1,000 mg by mouth every 8 (eight) hours as needed for moderate pain or headache.     [provider]  AMBULATORY NON FORMULARY MEDICATION Rolator 11/09/19   Judi Saa, DO  diltiazem (CARDIZEM CD) 180 MG 24 hr capsule TAKE 1 CAPSULE BY MOUTH EVERY DAY 06/20/20   Myrlene Broker, MD  furosemide (LASIX) 40 MG tablet Take 120 mg by mouth daily.    [provider]  ipratropium (ATROVENT) 0.02 % nebulizer solution Take 5 mLs (1 mg total) by nebulization every 6 (six) hours as needed for wheezing or shortness of breath. 08/04/18   Alwyn Ren, MD  irbesartan (AVAPRO) 150 MG tablet TAKE 1/2 TABLET BY MOUTH EVERY DAY 03/13/20   Myrlene Broker, MD  KLOR-CON M20 20 MEQ tablet TAKE 1 TABLET BY MOUTH EVERY DAY 04/12/20   Myrlene Broker, MD  levothyroxine (SYNTHROID) 100 MCG tablet TAKE 1 TABLET BY MOUTH EVERY DAY 05/28/20   Myrlene Broker, MD  metoprolol succinate (TOPROL-XL) 50 MG 24 hr tablet TAKE 1 TABLET BY MOUTH DAILY WITH OR IMMEDIATELY FOLLOWING A MEAL. 10/24/19   Myrlene Broker, MD  nystatin ointment (MYCOSTATIN) Apply 1 application topically 2 (two) times daily. 03/02/20   Myrlene Broker, MD  triamcinolone cream (KENALOG) 0.1 % Apply 1 application topically 2 (two) times daily. 11/09/19   Myrlene Broker, MD    Physical Exam: Vitals:   09/02/20 1700 09/02/20 1715 09/02/20 1730 09/02/20 1908  BP: (!) 153/93 (!) 163/89 (!) 165/83 (!) 158/88  Pulse: (!) 104 (!) 107 (!) 107   Resp: 20 19 (!) 21   Temp:   98.5 F (36.9 C)   TempSrc:   Oral   SpO2: 96% 94% 98%      . General:  Appears calm and comfortable and is in NAD, mild URI symptoms noted . Eyes:  PERRL, EOMI, normal lids, iris . ENT:  grossly normal hearing, lips & tongue, mmm . Neck:  no LAD, masses or thyromegaly . Cardiovascular:  RRR, no m/r/g. No LE edema.  [name] Respiratory:   CTA bilaterally with no wheezes/rales/rhonchi.  Normal respiratory effort. . Abdomen:  Protuberant, NT . Skin:  no rash or induration seen on limited exam . Musculoskeletal:  grossly normal tone BUE/BLE, good ROM, no bony abnormality . Lower extremity:  No LE edema.  Limited foot exam with no ulcerations.  2+ distal pulses. [name] Psychiatric:  grossly normal mood and affect, speech fluent and appropriate, AOx3 . Neurologic:  CN 2-12 grossly intact, moves all extremities in coordinated fashion    Radiological Exams on Admission: Independently reviewed - see discussion in A/P where applicable  DG Chest 2 View  Result Date: 09/02/2020 CLINICAL DATA:  Dyspnea EXAM: CHEST - 2 VIEW COMPARISON:  09/19/2018 chest radiograph. FINDINGS: Stable cardiomediastinal silhouette with mild cardiomegaly. No pneumothorax. No pleural effusion. No overt pulmonary  edema. No acute consolidative airspace disease. IMPRESSION: Mild cardiomegaly without overt pulmonary edema. Electronically Signed   By: Jannifer Rodney.D.  On: 09/02/2020 10:19    EKG: Independently reviewed.  Afib with rate 90; low voltage with no evidence of acute ischemia   Labs on Admission: I have personally reviewed the available labs and imaging studies at the time of the admission.  Pertinent labs:   Glucose 119 BUN 21/Creatinine 1.38/GFR 37; 24/1.08/4 on 06/07/20 BNP 248.3 HS troponin 12, 12 WBC 6.7 Hgb 6.8 COVID/flu negative   Assessment/Plan Principal Problem:   Acute viral syndrome Active Problems:   Hypothyroidism   Hyperlipidemia   Essential hypertension   Atrial fibrillation (HCC)   Type 2 diabetes mellitus without complication (HCC)   Chronic respiratory failure with hypoxia (HCC)   Chronic diastolic (congestive) heart failure (HCC)   Obesity hypoventilation syndrome (HCC)   Urothelial cancer (HCC)   Acute URI with chronic respiratory failure -Patient with chronic O2 requirement presenting with URI symptoms -She is vaccinated but has not received a booster and has a COVID exposure (grandniece and her boyfriend were present over Christmas and have COVID), so she is still at high risk despite negative test - will repeat -Will also order viral respiratory panel -If stable on home O2 tomorrow, may be appropriate for d/c  Anemia -There may also be a component of symptomatic anemia present -Hgb 6.8; no recent comparison available -MCV 92.2, consistent with anemia of chronic disease - likely associated with her metastatic bladder cancer -Will observe on telemetry -Transfuse 1 unit PRBC to start and recheck CBC in AM -Patient counseled about short- and long-term risks associated with transfusion and consents to receive blood products.  Urothelial cancer -Followed by [facility] -Initial cancer about 3 years ago with recurrence in October 2021 -Has liver  mets -She opted for comfort care with hospice -Unfortunately, there appears to have been a disagreement with [facility] and they have terminated this relationship -Patient would like to resume hospice with an alternate agency -TOC team consult requested for assistance -Oxycodone to help with pain control for now  HTN -Continue Toprol XL, Cardizem  HLD -Continue Lipitor for now -Given overall circumstances, it may be reasonable to stop this medication in the future  DM -Diet controlled -A1c in 07/2018 was 5.9 -There does not appear to be a need for tight glycemic control  Afib -Eliquis stopped in October due to gross hematuria -Rate controlled with Cardizem, Toprol XL  Chronic respiratory failure/OHS/Obesity -Stable on home O2 despite today's symptoms -BMI is 43  Chronic diastolic CHF -Appears to be compensated at this time -Resume Lasix in AM  Hypothyroidism -Continue Synthroid at current dose for now    Note: This patient has been tested and is negative for the novel coronavirus COVID-19; she is being retested and remains a PUI at this time. The patient has been vaccinated against COVID-19 but has not received her booster.    DVT prophylaxis:  SCDs Code Status:  DNR - confirmed with patient Family Communication: None present; I spoke with the patient's husband by telephone at the time of admission. Disposition Plan:  The patient is from: home  Anticipated d/c is to: home without [facility] services once her cardiology issues have been resolved.  Anticipated d/c date will depend on clinical response to treatment, but possibly as early as tomorrow if she has excellent response to treatment  Patient is currently: acutely ill Consults called: TOC team Admission status:  It is my clinical opinion that referral for OBSERVATION is reasonable and necessary in this patient based on the above information provided. The aforementioned taken together are  felt to place the patient at  high risk for further clinical deterioration. However it is anticipated that the patient may be medically stable for discharge from the hospital within 24 to 48 hours.    Karmen Bongo MD [facility]   How to contact the [facility] Attending or Consulting provider [place] or covering provider during after hours [place], for this patient?  1. Check the care team in [facility] and look for a) attending/consulting TRH provider listed and b) the [facility] team listed 2. Log into [email or web address] and use [facility] universal password to access. If you do not have the password, please contact the hospital operator. 3. Locate the [facility] provider you are looking for under [facility] and page to a number that you can be directly reached. 4. If you still have difficulty reaching the provider, please page the [facility] (Director on Call) for the Hospitalists listed on amion for assistance.   09/02/2020, 7:50 PM

## 2020-09-02 NOTE — ED Notes (Signed)
Clelia Schaumann (daughter) wants an update [phone]

## 2020-09-02 NOTE — ED Notes (Signed)
Pt provided with sandwich and water.

## 2020-09-02 NOTE — ED Notes (Signed)
Pt placed on hospital bed and given fresh blankets.

## 2020-09-02 NOTE — ED Provider Notes (Signed)
[facility] EMERGENCY DEPARTMENT Provider Note   CSN: [id number] Arrival date & time: 09/02/20  0932     History No chief complaint on file.   AME MCNULTY is a 87 y.o. female.  The history is provided by the patient and medical records.   SUNDY MAIDONADO is a 87 y.o. female who presents to the Emergency Department complaining of shortness of breath. She presents the emergency department by EMS from home for evaluation of increased shortness of breath over the last two days. She is short of breath at baseline and has a history of COPD as well as CHF and is on 2 L of oxygen. Over the last few days she has been feeling unwell with nausea, poor appetite and increased difficulty breathing. She is cough productive of white sputum. She denies any fevers, chest pain, abdominal pain, nausea, vomiting, black or bloody stools. She does have a history of bladder cancer that is metastatic and is not currently undergoing treatment. She is not on a blood thinner. She is currently on hospice care.    Past Medical History:  Diagnosis Date  . Allergy   . Arthritis   . Atrial fibrillation ([place])   . Bronchitis   . Cancer ([place])    Bilateral upper tract transitional cell carcinoma  . Cardiomegaly   . CHF (congestive heart failure) ([place])   . Complication of anesthesia    Difficult to arouse  . COPD (chronic obstructive pulmonary disease) (H[facility])    borderline   pt. denies at preop  . Diabetes mellitus without complication ([place])    Controlled by diet and exercise  . Diverticulosis of sigmoid colon   . Family history of bladder cancer   . Family history of colon cancer   . Family history of kidney cancer   . Fatty liver   . GERD (gastroesophageal reflux disease)   . History of hiatal hernia 01/20/2015   Small sliding, noted on CT  . History of kidney stones   . Hyperlipidemia   . Hypertension   . Hypothyroidism   . Lynch syndrome   . PONV (postoperative nausea and  vomiting)   . Supraumbilical hernia 0000000   Small noted on CT    Patient Active Problem List   Diagnosis Date Noted  . Acute viral syndrome 09/02/2020  . Gross hematuria 03/02/2020  . Candidal skin infection 03/02/2020  . Degenerative arthritis of right knee 11/09/2019  . COPD with acute exacerbation ([place]) 07/30/2018  . Lynch syndrome 05/05/2018  . Genetic testing 04/28/2018  . Adjustment disorder 04/23/2018  . Urothelial cancer ([place]) 04/12/2018  . Lichen planus [id number]  . Obesity hypoventilation syndrome ([place])   . Hypokalemia   . Chronic diastolic (congestive) heart failure ([place]) 01/09/2016  . History of snoring 05/20/2015  . Chronic respiratory failure with hypoxia ([place])   . Type 2 diabetes mellitus without complication ([place])   . Atrial fibrillation ([place]) 01/06/2015  . Hypothyroidism 03/09/2007  . Hyperlipidemia 03/09/2007  . Morbid obesity ([employer]) 03/09/2007  . Essential hypertension 03/09/2007  . ALLERGIC RHINITIS 03/09/2007  . Asthma 03/09/2007  . GERD 03/09/2007  . INCONTINENCE 03/09/2007    Past Surgical History:  Procedure Laterality Date  . ABDOMINAL HYSTERECTOMY    . APPENDECTOMY    . CHOLECYSTECTOMY    . COLONOSCOPY  07/16/2009  . CYSTOSCOPY W/ URETERAL STENT PLACEMENT Right 12/04/2017   Procedure: CYSTOSCOPY WITH RETROGRADE PYELOGRAM/URETERAL STENT PLACEMENT;  Surgeon: Festus Aloe, MD;  Location: [facility];  Service:  Urology;  Laterality: Right;  . CYSTOSCOPY W/ URETERAL STENT PLACEMENT Right 02/26/2018   Procedure: CYSTOSCOPY WITH RETROGRADE PYELOGRAM/URETERAL STENT PLACEMENT;  Surgeon: Kathie Rhodes, MD;  Location: [facility];  Service: Urology;  Laterality: Right;  . CYSTOSCOPY WITH URETEROSCOPY AND STENT PLACEMENT Bilateral 01/29/2018   Procedure: BILATERAL URETEROSCOPY AND TUMOR RESECTION  WITH LASER RIGHT STENT EXCHANGE LEFT STENT PLACEMENT;  Surgeon: Ardis Hughs, MD;  Location: [facility];  Service: Urology;  Laterality: Bilateral;  . CYSTOSCOPY  WITH URETEROSCOPY AND STENT PLACEMENT Bilateral 02/10/2018   Procedure: BILATERAL URETEROSCOPY AND TUMOR EXCISION BILATERAL STENT EXCHANGE;  Surgeon: Ardis Hughs, MD;  Location: [facility];  Service: Urology;  Laterality: Bilateral;  . CYSTOSCOPY/RETROGRADE/URETEROSCOPY Bilateral 12/24/2017   Procedure: BILATERAL URETEROSCOPY, RIGHT STENT EXCHANGE, STONE EXTRACTION WITH BASKET BILATERAL RETROGRADE PYELOGRAM, BIOPSY OF RIGHT UPPER POLE TUMOR,  BILATERAL RENAL PELVIS FLUID SENT FOR CYTOLOGY;  Surgeon: Ardis Hughs, MD;  Location: [facility];  Service: Urology;  Laterality: Bilateral;  . CYSTOSCOPY/URETEROSCOPY/HOLMIUM LASER/STENT PLACEMENT Bilateral 06/03/2018   Procedure: CYSTOSCOPY/BILATERAL URETEROSCOPY/ LASER TUMOR ABLATION BILATERAL / BILATERAL STENT PLACEMENT/BILATERAL RETROGRADE PYELOGRAM/BLADDER BIOPSY WITH FULGURATION;  Surgeon: Ardis Hughs, MD;  Location: [facility];  Service: Urology;  Laterality: Bilateral;  . HOLMIUM LASER APPLICATION Bilateral [id number]   Procedure: HOLMIUM LASER APPLICATION;  Surgeon: Ardis Hughs, MD;  Location: [facility];  Service: Urology;  Laterality: Bilateral;  . KNEE SURGERY Right   . KNEE SURGERY Left    torn ligament repair  . UPPER GI ENDOSCOPY    . ureteroscopy laser tumor ablation bilateral stent placement     Dr. Louis Meckel 06-03-18      OB History   No obstetric history on file.     Family History  Problem Relation Age of Onset  . Heart disease Mother   . Colon cancer Mother 29       d. 63  . Tuberculosis Father   . Bladder Cancer Brother        urothelial cancer  . Bladder Cancer Brother        ureter cancer  . Kidney cancer Brother   . Skin cancer Sister   . Cancer Maternal Aunt        NOS  . Colon cancer Other        dx 1st time under 68, second time at 17  . Lung cancer Other   . Muscular dystrophy Other        d. 24  . Testicular cancer Other 24       great nephew  . Alcohol abuse Daughter   . Drug abuse Daughter   .  Schizophrenia Daughter   . Colon cancer Grandchild     Social History   Tobacco Use  . Smoking status: Never Smoker  . Smokeless tobacco: Never Used  Vaping Use  . Vaping Use: Never used  Substance Use Topics  . Alcohol use: No    Alcohol/week: 0.0 standard drinks  . Drug use: No    Home Medications Prior to Admission medications   Medication Sig Start Date End Date Taking? Authorizing Provider  acetaminophen (TYLENOL) 500 MG tablet Take 1,000 mg by mouth every 4 (four) hours as needed for moderate pain (in conjunection with Oxycodone IR 5 mg tablets).   Yes [provider]  albuterol (PROVENTIL) (2.5 MG/3ML) 0.083% nebulizer solution Take 2.5 mg by nebulization every 6 (six) hours as needed for wheezing or shortness of breath.   Yes [provider]  atorvastatin (LIPITOR)  20 MG tablet Take 20 mg by mouth every evening.   Yes [provider]  calcium carbonate (TUMS EX) 750 MG chewable tablet Chew 1 tablet by mouth as needed for heartburn.   Yes [provider]  diltiazem (CARDIZEM CD) 180 MG 24 hr capsule TAKE 1 CAPSULE BY MOUTH EVERY DAY Patient taking differently: Take 180 mg by mouth daily. 06/20/20  Yes Hoyt Koch, MD  furosemide (LASIX) 40 MG tablet Take 80 mg by mouth in the morning.   Yes [provider]  KLOR-CON M20 20 MEQ tablet TAKE 1 TABLET BY MOUTH EVERY DAY Patient taking differently: Take 20 mEq by mouth daily. 04/12/20  Yes Hoyt Koch, MD  levothyroxine (SYNTHROID) 100 MCG tablet TAKE 1 TABLET BY MOUTH EVERY DAY Patient taking differently: Take 100 mcg by mouth daily before breakfast. 05/28/20  Yes Hoyt Koch, MD  LORazepam (ATIVAN) 0.5 MG tablet Take 0.5 mg by mouth every 4 (four) hours as needed for anxiety.   Yes [provider]  metoprolol succinate (TOPROL-XL) 50 MG 24 hr tablet TAKE 1 TABLET BY MOUTH DAILY WITH OR IMMEDIATELY FOLLOWING A MEAL. Patient taking differently: Take  50 mg by mouth daily. 10/24/19  Yes Hoyt Koch, MD  nystatin ointment (MYCOSTATIN) Apply 1 application topically 2 (two) times daily. Patient taking differently: Apply 1 application topically See admin instructions. Apply to affected areas 2 times a day 03/02/20  Yes Hoyt Koch, MD  oxyCODONE (OXY IR/ROXICODONE) 5 MG immediate release tablet Take 5-10 mg by mouth every 4 (four) hours as needed (for pain).   Yes [provider]  OXYGEN Inhale 2 L/min into the lungs continuous.   Yes [provider]  promethazine (PHENERGAN) 25 MG tablet Take 25 mg by mouth every 8 (eight) hours as needed for nausea or vomiting.   Yes [provider]  AMBULATORY NON FORMULARY MEDICATION [place] 11/09/19   Lyndal Pulley, DO  ipratropium (ATROVENT) 0.02 % nebulizer solution Take 5 mLs (1 mg total) by nebulization every 6 (six) hours as needed for wheezing or shortness of breath. Patient not taking: Reported on 09/02/2020 08/04/18 09/02/20  Georgette Shell, MD  irbesartan (AVAPRO) 150 MG tablet TAKE 1/2 TABLET BY MOUTH EVERY DAY Patient not taking: Reported on 09/02/2020 03/13/20 09/02/20  Hoyt Koch, MD    Allergies    Eliquis [apixaban], Oxycodone, and Codeine  Review of Systems   Review of Systems  All other systems reviewed and are negative.   Physical Exam Updated Vital Signs BP 138/80   Pulse (!) 122   Temp 98.9 F (37.2 C) (Oral)   Resp (!) 21   LMP  (LMP Unknown)   SpO2 98%   Physical Exam Vitals and nursing note reviewed.  Constitutional:      Appearance: She is well-developed and well-nourished.  HENT:     Head: Normocephalic and atraumatic.  Cardiovascular:     Rate and Rhythm: Normal rate. Rhythm irregular.     Heart sounds: No murmur heard.   Pulmonary:     Effort: Pulmonary effort is normal. No respiratory distress.     Comments: Diffuse wheezing Abdominal:     Palpations: Abdomen is soft.     Tenderness: There is no  abdominal tenderness. There is no guarding or rebound.  Genitourinary:    Comments: Brown stool, no gross blood Musculoskeletal:        General: No swelling, tenderness or edema.  Skin:    General:  Skin is warm and dry.  Neurological:     Mental Status: She is alert and oriented to person, place, and time.  Psychiatric:        Mood and Affect: Mood and affect normal.        Behavior: Behavior normal.     ED Results / Procedures / Treatments   Labs (all labs ordered are listed, but only abnormal results are displayed) Labs Reviewed  BASIC METABOLIC PANEL - Abnormal; Notable for the following components:      Result Value   Glucose, Bld 119 (*)    Creatinine, Ser 1.38 (*)    Calcium 8.6 (*)    GFR, Estimated 37 (*)    All other components within normal limits  CBC - Abnormal; Notable for the following components:   RBC 2.43 (*)    Hemoglobin 6.8 (*)    HCT 22.4 (*)    RDW 15.7 (*)    All other components within normal limits  BRAIN NATRIURETIC PEPTIDE - Abnormal; Notable for the following components:   B Natriuretic Peptide 248.3 (*)    All other components within normal limits  RESP PANEL BY RT-PCR (FLU A&B, COVID) ARPGX2  RESP PANEL BY RT-PCR (FLU A&B, COVID) ARPGX2  RESPIRATORY PANEL BY PCR  HIV ANTIBODY (ROUTINE TESTING W REFLEX)  TYPE AND SCREEN  PREPARE RBC (CROSSMATCH)  TROPONIN I (HIGH SENSITIVITY)  TROPONIN I (HIGH SENSITIVITY)    EKG EKG Interpretation  Date/Time:  Sunday September 02 2020 09:50:45 EST Ventricular Rate:  90 PR Interval:    QRS Duration: 86 QT Interval:  366 QTC Calculation: 447 R Axis:   56 Text Interpretation: Atrial fibrillation Low voltage QRS Abnormal ECG Confirmed by Quintella Reichert [phone]) on 09/02/2020 12:20:36 PM   Radiology DG Chest 2 View  Result Date: 09/02/2020 CLINICAL DATA:  Dyspnea EXAM: CHEST - 2 VIEW COMPARISON:  09/19/2018 chest radiograph. FINDINGS: Stable cardiomediastinal silhouette with mild cardiomegaly. No  pneumothorax. No pleural effusion. No overt pulmonary edema. No acute consolidative airspace disease. IMPRESSION: Mild cardiomegaly without overt pulmonary edema. Electronically Signed   By: Ilona Sorrel M.D.   On: 09/02/2020 10:19    Procedures Procedures (including critical care time) CRITICAL CARE Performed by: Quintella Reichert   Total critical care time: 40 minutes  Critical care time was exclusive of separately billable procedures and treating other patients.  Critical care was necessary to treat or prevent imminent or life-threatening deterioration.  Critical care was time spent personally by me on the following activities: development of treatment plan with patient and/or surrogate as well as nursing, discussions with consultants, evaluation of patient's response to treatment, examination of patient, obtaining history from patient or surrogate, ordering and performing treatments and interventions, ordering and review of laboratory studies, ordering and review of radiographic studies, pulse oximetry and re-evaluation of patient's condition.  Medications Ordered in ED Medications  0.9 %  sodium chloride infusion (Manually program via Guardrails IV Fluids) (has no administration in time range)  atorvastatin (LIPITOR) tablet 20 mg (has no administration in time range)  diltiazem (CARDIZEM CD) 24 hr capsule 180 mg (has no administration in time range)  furosemide (LASIX) tablet 80 mg (has no administration in time range)  metoprolol succinate (TOPROL-XL) 24 hr tablet 50 mg (has no administration in time range)  LORazepam (ATIVAN) tablet 0.5 mg (has no administration in time range)  levothyroxine (SYNTHROID) tablet 100 mcg (has no administration in time range)  calcium carbonate (TUMS - dosed in mg elemental calcium)  chewable tablet 750 mg (has no administration in time range)  potassium chloride SA (KLOR-CON) CR tablet 20 mEq (has no administration in time range)  enoxaparin (LOVENOX)  injection 40 mg (has no administration in time range)  lactated ringers infusion (has no administration in time range)  methylPREDNISolone sodium succinate (SOLU-MEDROL) 125 mg/2 mL injection 60 mg (has no administration in time range)    Followed by  predniSONE (DELTASONE) tablet 40 mg (has no administration in time range)  Ipratropium-Albuterol (COMBIVENT) respimat 1 puff (has no administration in time range)  albuterol (VENTOLIN HFA) 108 (90 Base) MCG/ACT inhaler 2 puff (has no administration in time range)    ED Course  I have reviewed the triage vital signs and the nursing notes.  Pertinent labs & imaging results that were available during my care of the patient were reviewed by me and considered in my medical decision making (see chart for details).    MDM Rules/Calculators/A&P                          Patient with COPD, metastatic bladder cancer not currently on chemotherapy here for evaluation of shortness of breath for the last few days. She has wheezing on examination without respiratory distress. Labs significant for profound anemia when compared to priors. No evidence of active bleeding on examination. She was previously on hospice but the hospice service was discontinued on Friday. Family would like to initiate a new hospice service. Patient does want treatment with blood products if this will potentially improve the way she feels. Feel that she would benefit from a blood transfusion at this time given the degree of her anemia. Given she is not currently established with the hospice service, unclear if she will respond to one blood transfusion or will need multiple transfusions medicine consulted for admission for observation. Patient and granddaughter updated of findings of studies and recommendation and they are in agreement with plan at this time.   Final Clinical Impression(s) / ED Diagnoses Final diagnoses:  Symptomatic anemia    Rx / DC Orders ED Discharge Orders     None       Tilden Fossa, MD 09/02/20 1611

## 2020-09-02 NOTE — ED Notes (Signed)
Pt grand daughter called [facility]) for an update please call [phone]

## 2020-09-02 NOTE — ED Notes (Signed)
Pts granddaughter Waynetta Sandy given an update. Dr. Ophelia Charter being contacted about admitting concerns.

## 2020-09-02 NOTE — ED Triage Notes (Signed)
Patient arrived by [facility] for increased SOB . Patient has hx of COPD and received neb treatment prior to arrival. Patient arrived on 2L of oxygen and is on same all the time. Speaking complete sentences and denies pain. NAD

## 2020-09-03 DIAGNOSIS — B349 Viral infection, unspecified: Secondary | ICD-10-CM | POA: Diagnosis not present

## 2020-09-03 LAB — TYPE AND SCREEN
ABO/RH(D): O POS
Antibody Screen: NEGATIVE
Unit division: 0

## 2020-09-03 LAB — BPAM RBC
Blood Product Expiration Date: 202201182359
ISSUE DATE / TIME: 202201021542
Unit Type and Rh: 5100

## 2020-09-03 LAB — HIV ANTIBODY (ROUTINE TESTING W REFLEX): HIV Screen 4th Generation wRfx: NONREACTIVE

## 2020-09-03 LAB — CBC
HCT: 27.8 % — ABNORMAL LOW (ref 36.0–46.0)
Hemoglobin: 8.4 g/dL — ABNORMAL LOW (ref 12.0–15.0)
MCH: 27.7 pg (ref 26.0–34.0)
MCHC: 30.2 g/dL (ref 30.0–36.0)
MCV: 91.7 fL (ref 80.0–100.0)
Platelets: 372 10*3/uL (ref 150–400)
RBC: 3.03 MIL/uL — ABNORMAL LOW (ref 3.87–5.11)
RDW: 15.8 % — ABNORMAL HIGH (ref 11.5–15.5)
WBC: 9.3 10*3/uL (ref 4.0–10.5)
nRBC: 0 % (ref 0.0–0.2)

## 2020-09-03 LAB — BASIC METABOLIC PANEL
Anion gap: 12 (ref 5–15)
BUN: 17 mg/dL (ref 8–23)
CO2: 24 mmol/L (ref 22–32)
Calcium: 8.9 mg/dL (ref 8.9–10.3)
Chloride: 103 mmol/L (ref 98–111)
Creatinine, Ser: 1.19 mg/dL — ABNORMAL HIGH (ref 0.44–1.00)
GFR, Estimated: 45 mL/min — ABNORMAL LOW (ref >60)
Glucose, Bld: 189 mg/dL — ABNORMAL HIGH (ref 70–99)
Potassium: 4.4 mmol/L (ref 3.5–5.1)
Sodium: 139 mmol/L (ref 135–145)

## 2020-09-03 LAB — RESP PANEL BY RT-PCR (FLU A&B, COVID) ARPGX2
Influenza A by PCR: NEGATIVE
Influenza B by PCR: NEGATIVE
SARS Coronavirus 2 by RT PCR: NEGATIVE

## 2020-09-03 MED ORDER — DM-GUAIFENESIN ER 30-600 MG PO TB12
1.0000 | ORAL_TABLET | Freq: Two times a day (BID) | ORAL | Status: DC
  Administered 2020-09-03: 1 via ORAL
  Filled 2020-09-03 (×2): qty 1

## 2020-09-03 MED ORDER — IPRATROPIUM BROMIDE HFA 17 MCG/ACT IN AERS: 1.0000 | INHALATION_SPRAY | Freq: Three times a day (TID) | RESPIRATORY_TRACT | 2 refills | Status: AC

## 2020-09-03 MED ORDER — AZITHROMYCIN 250 MG PO TABS
500.0000 mg | ORAL_TABLET | Freq: Every day | ORAL | Status: DC
  Administered 2020-09-03: 500 mg via ORAL
  Filled 2020-09-03: qty 2

## 2020-09-03 MED ORDER — BENZONATATE 200 MG PO CAPS: 200.0000 mg | ORAL_CAPSULE | Freq: Two times a day (BID) | ORAL | 0 refills | Status: DC | PRN

## 2020-09-03 MED ORDER — BENZONATATE 100 MG PO CAPS
200.0000 mg | ORAL_CAPSULE | Freq: Two times a day (BID) | ORAL | Status: DC | PRN
  Administered 2020-09-03: 200 mg via ORAL
  Filled 2020-09-03: qty 2

## 2020-09-03 MED ORDER — PREDNISONE 20 MG PO TABS: 40.0000 mg | ORAL_TABLET | Freq: Every day | ORAL | 0 refills | Status: AC

## 2020-09-03 MED ORDER — ALBUTEROL SULFATE (2.5 MG/3ML) 0.083% IN NEBU: 2.5000 mg | INHALATION_SOLUTION | Freq: Four times a day (QID) | RESPIRATORY_TRACT | 0 refills | Status: DC

## 2020-09-03 MED ORDER — DM-GUAIFENESIN ER 30-600 MG PO TB12: 1.0000 | ORAL_TABLET | Freq: Two times a day (BID) | ORAL | 0 refills | Status: DC

## 2020-09-03 MED ORDER — AZITHROMYCIN 250 MG PO TABS: 500.0000 mg | ORAL_TABLET | Freq: Every day | ORAL | 0 refills | Status: AC

## 2020-09-03 NOTE — Progress Notes (Signed)
TOC CM attempted call to pt's grand-dtr, left message for grand-dtr to return call. Isidoro Donning RN CCM, [facility] ED TOC CM [phone]

## 2020-09-03 NOTE — ED Notes (Signed)
Pt given pitcher of water.

## 2020-09-03 NOTE — ED Notes (Signed)
Pt up sitting on side of bed with this RN's assistance.

## 2020-09-03 NOTE — ED Notes (Signed)
Lunch Tray Ordered @ 1045 

## 2020-09-03 NOTE — ED Notes (Signed)
Ambulatory sats done with patient up to the bedside commode. Patient is not very mobile, but can shuffle and pivot. No desats present.

## 2020-09-03 NOTE — Discharge Summary (Signed)
Physician Discharge Summary  LARRA CACACE [id number] DOB: 1931/11/21 DOA: 09/02/2020  PCP: Hoyt Koch, MD  Admit date: 09/02/2020 Discharge date: 09/03/2020  Admitted From: Home  Disposition:  Home   Recommendations for Outpatient Follow-up:  1. Follow up with PCP in 1-2 weeks 2. Please obtain BMP/CBC in one week. 3. Resume Hospice care at home   Discharge Condition: Stable.  CODE STATUS; DNR Diet recommendation: Heart Healthy   Brief/Interim Summary: 87 year old with past medical history significant for hypothyroidism, Lynch syndrome, hypertension, hyperlipidemia, fatty liver, A. fib not on AC, mild dementia, COPD on 2 L of oxygen, metastatic bladder cancer on hospice care at home, chronic diastolic heart failure who presented with shortness of breath.  Patient presented with difficulty breathing the morning of admission.  She was around her niece who had Covid.  Denies fever.  Reports some cough and congestion.  She has metastatic cancer to the liver and not receiving treatment.  She is enrolled in hospice and her granddaughter did not want them to come over, so that home hospice agency was fired by granddaughter.  Patient was found to have low hemoglobin at 6.  Covid test negative.  1-Acute URI with chronic respiratory failure: -Initial Covid test negative. -My colleague order second Covid test pending at this time.  If negative patient can be discharged. -Plan to treat for COPD exacerbation, azithromycin for 5 days, prednisone for 5 days.  Continue with nebulizer -She is feeling better, plan to discharge home today if she has power back at home  2-Anemia, Patient with a hemoglobin of 6.8.  Received 1 unit of packed red blood cell.  Hemoglobin this morning at 8.  Patient feels better.  Urothelial cancer: Followed by [facility]. Arrange home with hospice with a different agency Resume pain medication  Hypertension: Continue with Toprol Cardizem  Diabetes:  Not on medications at home.  A. fib: Eliquis was a stop in October due to gross hematuria.  Continue with Cardizem and Toprol  Chronic Hypoxic Respiratory failure, acute on chronic COPD exacerbation: Stable on 2 3 L of oxygen. Continue With nebulizer, prednisone.   Chronic Diastolic heart failure: Continue with Lasix  Hypothyroidism: Continue with  Synthroid    Discharge Diagnoses:  Principal Problem:   Acute viral syndrome Active Problems:   Hypothyroidism   Hyperlipidemia   Essential hypertension   Atrial fibrillation (HCC)   Type 2 diabetes mellitus without complication (HCC)   Chronic respiratory failure with hypoxia (HCC)   Chronic diastolic (congestive) heart failure (HCC)   Obesity hypoventilation syndrome (HCC)   Urothelial cancer (HCC)   Symptomatic anemia    Discharge Instructions  Discharge Instructions    Diet - low sodium heart healthy   Complete by: As directed    Increase activity slowly   Complete by: As directed      Allergies as of 09/03/2020      Reactions   Eliquis [apixaban] Other (See Comments)   Was told to NOT take because it caused internal bleeding   Oxycodone Other (See Comments)   Must have anti-nausea medication to tolerate (otherwise, nausea/vomiting)   Codeine Hives, Rash, Other (See Comments)   In cough syrup preparations      Medication List    TAKE these medications   acetaminophen 500 MG tablet Commonly known as: TYLENOL Take 1,000 mg by mouth every 4 (four) hours as needed for moderate pain (in conjunection with Oxycodone IR 5 mg tablets).   albuterol (2.5 MG/3ML) 0.083% nebulizer solution  Commonly known as: PROVENTIL Take 3 mLs (2.5 mg total) by nebulization every 6 (six) hours. What changed:   when to take this  reasons to take this   AMBULATORY NON FORMULARY MEDICATION Rolator   atorvastatin 20 MG tablet Commonly known as: LIPITOR Take 20 mg by mouth every evening.   azithromycin 250 MG tablet Commonly known  as: ZITHROMAX Take 2 tablets (500 mg total) by mouth daily for 4 days.   benzonatate 200 MG capsule Commonly known as: TESSALON Take 1 capsule (200 mg total) by mouth 2 (two) times daily as needed for cough.   calcium carbonate 750 MG chewable tablet Commonly known as: TUMS EX Chew 1 tablet by mouth as needed for heartburn.   dextromethorphan-guaiFENesin 30-600 MG 12hr tablet Commonly known as: MUCINEX DM Take 1 tablet by mouth 2 (two) times daily.   diltiazem 180 MG 24 hr capsule Commonly known as: CARDIZEM CD TAKE 1 CAPSULE BY MOUTH EVERY DAY What changed: how much to take   furosemide 40 MG tablet Commonly known as: LASIX Take 80 mg by mouth in the morning.   ipratropium 17 MCG/ACT inhaler Commonly known as: ATROVENT HFA Inhale 1 puff into the lungs 3 (three) times daily.   Klor-Con M20 20 MEQ tablet Generic drug: potassium chloride SA TAKE 1 TABLET BY MOUTH EVERY DAY What changed: how much to take   levothyroxine 100 MCG tablet Commonly known as: SYNTHROID TAKE 1 TABLET BY MOUTH EVERY DAY What changed: when to take this   LORazepam 0.5 MG tablet Commonly known as: ATIVAN Take 0.5 mg by mouth every 4 (four) hours as needed for anxiety.   metoprolol succinate 50 MG 24 hr tablet Commonly known as: TOPROL-XL TAKE 1 TABLET BY MOUTH DAILY WITH OR IMMEDIATELY FOLLOWING A MEAL. What changed: See the new instructions.   nystatin ointment Commonly known as: MYCOSTATIN Apply 1 application topically 2 (two) times daily. What changed:   when to take this  additional instructions   oxyCODONE 5 MG immediate release tablet Commonly known as: Oxy IR/ROXICODONE Take 5-10 mg by mouth every 4 (four) hours as needed (for pain).   OXYGEN Inhale 2 L/min into the lungs continuous.   predniSONE 20 MG tablet Commonly known as: DELTASONE Take 2 tablets (40 mg total) by mouth daily with breakfast for 5 days.   promethazine 25 MG tablet Commonly known as: PHENERGAN Take 25  mg by mouth every 8 (eight) hours as needed for nausea or vomiting.       Follow-up Information    Hoyt Koch, MD. Schedule an appointment as soon as possible for a visit.   Specialty: Internal Medicine Contact information: [place] [phone]              Allergies  Allergen Reactions  . Eliquis [Apixaban] Other (See Comments)    Was told to NOT take because it caused internal bleeding  . Oxycodone Other (See Comments)    Must have anti-nausea medication to tolerate (otherwise, nausea/vomiting)  . Codeine Hives, Rash and Other (See Comments)    In cough syrup preparations    Consultations:  none   Procedures/Studies: DG Chest 2 View  Result Date: 09/02/2020 CLINICAL DATA:  Dyspnea EXAM: CHEST - 2 VIEW COMPARISON:  09/19/2018 chest radiograph. FINDINGS: Stable cardiomediastinal silhouette with mild cardiomegaly. No pneumothorax. No pleural effusion. No overt pulmonary edema. No acute consolidative airspace disease. IMPRESSION: Mild cardiomegaly without overt pulmonary edema. Electronically Signed   By: Corene Cornea  A Poff M.D.   On: 09/02/2020 10:19     Subjective: She is feeling better., breathing better.   Discharge Exam: Vitals:   09/03/20 1200 09/03/20 1300  BP: 131/62 122/72  Pulse: 90 91  Resp: 19 (!) 22  Temp:    SpO2: 95% 94%     General: Pt is alert, awake, not in acute distress Cardiovascular: RRR, S1/S2 +, no rubs, no gallops Respiratory: CTA bilaterally, no wheezing, no rhonchi Abdominal: Soft, NT, ND, bowel sounds + Extremities: no edema, no cyanosis    The results of significant diagnostics from this hospitalization (including imaging, microbiology, ancillary and laboratory) are listed below for reference.     Microbiology: Recent Results (from the past 240 hour(s))  Resp Panel by RT-PCR (Flu A&B, Covid) Nasopharyngeal Swab     Status: None   Collection Time: 09/02/20 12:15 PM   Specimen:  Nasopharyngeal Swab; Nasopharyngeal(NP) swabs in vial transport medium  Result Value Ref Range Status   SARS Coronavirus 2 by RT PCR NEGATIVE NEGATIVE Final    Comment: (NOTE) SARS-CoV-2 target nucleic acids are NOT DETECTED.  The SARS-CoV-2 RNA is generally detectable in upper respiratory specimens during the acute phase of infection. The lowest concentration of SARS-CoV-2 viral copies this assay can detect is 138 copies/mL. A negative result does not preclude SARS-Cov-2 infection and should not be used as the sole basis for treatment or other patient management decisions. A negative result may occur with  improper specimen collection/handling, submission of specimen other than nasopharyngeal swab, presence of viral mutation(s) within the areas targeted by this assay, and inadequate number of viral copies(<138 copies/mL). A negative result must be combined with clinical observations, patient history, and epidemiological information. The expected result is Negative.  Fact Sheet for Patients:  EntrepreneurPulse.com.au  Fact Sheet for Healthcare Providers:  IncredibleEmployment.be  This test is no t yet approved or cleared by the [place] FDA and  has been authorized for detection and/or diagnosis of SARS-CoV-2 by FDA under an Emergency Use Authorization (EUA). This EUA will remain  in effect (meaning this test can be used) for the duration of the COVID-19 declaration under Section 564(b)(1) of the Act, 21 U.S.C.section 360bbb-3(b)(1), unless the authorization is terminated  or revoked sooner.       Influenza A by PCR NEGATIVE NEGATIVE Final   Influenza B by PCR NEGATIVE NEGATIVE Final    Comment: (NOTE) The Xpert Xpress SARS-CoV-2/FLU/RSV plus assay is intended as an aid in the diagnosis of influenza from Nasopharyngeal swab specimens and should not be used as a sole basis for treatment. Nasal washings and aspirates are unacceptable for  Xpert Xpress SARS-CoV-2/FLU/RSV testing.  Fact Sheet for Patients: EntrepreneurPulse.com.au  Fact Sheet for Healthcare Providers: IncredibleEmployment.be  This test is not yet approved or cleared by the [place] FDA and has been authorized for detection and/or diagnosis of SARS-CoV-2 by FDA under an Emergency Use Authorization (EUA). This EUA will remain in effect (meaning this test can be used) for the duration of the COVID-19 declaration under Section 564(b)(1) of the Act, 21 U.S.C. section 360bbb-3(b)(1), unless the authorization is terminated or revoked.  Performed at [facility], [place] [address], [place], [place] [address]   Resp Panel by RT-PCR (Flu A&B, Covid) Nasopharyngeal Swab     Status: None   Collection Time: 09/03/20  6:40 AM   Specimen: Nasopharyngeal Swab; Nasopharyngeal(NP) swabs in vial transport medium  Result Value Ref Range Status   SARS Coronavirus 2 by RT PCR  NEGATIVE NEGATIVE Final    Comment: (NOTE) SARS-CoV-2 target nucleic acids are NOT DETECTED.  The SARS-CoV-2 RNA is generally detectable in upper respiratory specimens during the acute phase of infection. The lowest concentration of SARS-CoV-2 viral copies this assay can detect is 138 copies/mL. A negative result does not preclude SARS-Cov-2 infection and should not be used as the sole basis for treatment or other patient management decisions. A negative result may occur with  improper specimen collection/handling, submission of specimen other than nasopharyngeal swab, presence of viral mutation(s) within the areas targeted by this assay, and inadequate number of viral copies(<138 copies/mL). A negative result must be combined with clinical observations, patient history, and epidemiological information. The expected result is Negative.  Fact Sheet for Patients:  EntrepreneurPulse.com.au  Fact Sheet for Healthcare Providers:   IncredibleEmployment.be  This test is no t yet approved or cleared by the [place] FDA and  has been authorized for detection and/or diagnosis of SARS-CoV-2 by FDA under an Emergency Use Authorization (EUA). This EUA will remain  in effect (meaning this test can be used) for the duration of the COVID-19 declaration under Section 564(b)(1) of the Act, 21 U.S.C.section 360bbb-3(b)(1), unless the authorization is terminated  or revoked sooner.       Influenza A by PCR NEGATIVE NEGATIVE Final   Influenza B by PCR NEGATIVE NEGATIVE Final    Comment: (NOTE) The Xpert Xpress SARS-CoV-2/FLU/RSV plus assay is intended as an aid in the diagnosis of influenza from Nasopharyngeal swab specimens and should not be used as a sole basis for treatment. Nasal washings and aspirates are unacceptable for Xpert Xpress SARS-CoV-2/FLU/RSV testing.  Fact Sheet for Patients: EntrepreneurPulse.com.au  Fact Sheet for Healthcare Providers: IncredibleEmployment.be  This test is not yet approved or cleared by the [place] FDA and has been authorized for detection and/or diagnosis of SARS-CoV-2 by FDA under an Emergency Use Authorization (EUA). This EUA will remain in effect (meaning this test can be used) for the duration of the COVID-19 declaration under Section 564(b)(1) of the Act, 21 U.S.C. section 360bbb-3(b)(1), unless the authorization is terminated or revoked.  Performed at [facility], [place] [address] [place], [place] [address]      Labs: BNP (last 3 results) Recent Labs    09/02/20 1100  BNP 0000000*   Basic Metabolic Panel: Recent Labs  Lab 09/02/20 1100 09/03/20 0640  NA 138 139  K 3.9 4.4  CL 104 103  CO2 25 24  GLUCOSE 119* 189*  BUN 21 17  CREATININE 1.38* 1.19*  CALCIUM 8.6* 8.9   Liver Function Tests: No results for input(s): AST, ALT, ALKPHOS, BILITOT, PROT, ALBUMIN in the last 168 hours. No  results for input(s): LIPASE, AMYLASE in the last 168 hours. No results for input(s): AMMONIA in the last 168 hours. CBC: Recent Labs  Lab 09/02/20 1100 09/03/20 0640  WBC 6.7 9.3  HGB 6.8* 8.4*  HCT 22.4* 27.8*  MCV 92.2 91.7  PLT 374 372   Cardiac Enzymes: No results for input(s): CKTOTAL, CKMB, CKMBINDEX, TROPONINI in the last 168 hours. BNP: Invalid input(s): POCBNP CBG: No results for input(s): GLUCAP in the last 168 hours. D-Dimer No results for input(s): DDIMER in the last 72 hours. Hgb A1c No results for input(s): HGBA1C in the last 72 hours. Lipid Profile No results for input(s): CHOL, HDL, LDLCALC, TRIG, CHOLHDL, LDLDIRECT in the last 72 hours. Thyroid function studies No results for input(s): TSH, T4TOTAL, T3FREE, THYROIDAB in the last 72 hours.  Invalid input(s): FREET3 Anemia work up No results for input(s): VITAMINB12, FOLATE, FERRITIN, TIBC, IRON, RETICCTPCT in the last 72 hours. Urinalysis    Component Value Date/Time   COLORURINE RED (A) 06/07/2020 2014   APPEARANCEUR TURBID (A) 06/07/2020 2014   LABSPEC  06/07/2020 2014    TEST NOT REPORTED DUE TO COLOR INTERFERENCE OF URINE PIGMENT   PHURINE  06/07/2020 2014    TEST NOT REPORTED DUE TO COLOR INTERFERENCE OF URINE PIGMENT   GLUCOSEU (A) 06/07/2020 2014    TEST NOT REPORTED DUE TO COLOR INTERFERENCE OF URINE PIGMENT   HGBUR (A) 06/07/2020 2014    TEST NOT REPORTED DUE TO COLOR INTERFERENCE OF URINE PIGMENT   BILIRUBINUR (A) 06/07/2020 2014    TEST NOT REPORTED DUE TO COLOR INTERFERENCE OF URINE PIGMENT   BILIRUBINUR neg 03/02/2020 0934   KETONESUR (A) 06/07/2020 2014    TEST NOT REPORTED DUE TO COLOR INTERFERENCE OF URINE PIGMENT   PROTEINUR (A) 06/07/2020 2014    TEST NOT REPORTED DUE TO COLOR INTERFERENCE OF URINE PIGMENT   UROBILINOGEN 1.0 03/02/2020 0934   UROBILINOGEN 0.2 01/20/2015 1153   NITRITE (A) 06/07/2020 2014    TEST NOT REPORTED DUE TO COLOR INTERFERENCE OF URINE PIGMENT    LEUKOCYTESUR (A) 06/07/2020 2014    TEST NOT REPORTED DUE TO COLOR INTERFERENCE OF URINE PIGMENT   Sepsis Labs Invalid input(s): PROCALCITONIN,  WBC,  LACTICIDVEN Microbiology Recent Results (from the past 240 hour(s))  Resp Panel by RT-PCR (Flu A&B, Covid) Nasopharyngeal Swab     Status: None   Collection Time: 09/02/20 12:15 PM   Specimen: Nasopharyngeal Swab; Nasopharyngeal(NP) swabs in vial transport medium  Result Value Ref Range Status   SARS Coronavirus 2 by RT PCR NEGATIVE NEGATIVE Final    Comment: (NOTE) SARS-CoV-2 target nucleic acids are NOT DETECTED.  The SARS-CoV-2 RNA is generally detectable in upper respiratory specimens during the acute phase of infection. The lowest concentration of SARS-CoV-2 viral copies this assay can detect is 138 copies/mL. A negative result does not preclude SARS-Cov-2 infection and should not be used as the sole basis for treatment or other patient management decisions. A negative result may occur with  improper specimen collection/handling, submission of specimen other than nasopharyngeal swab, presence of viral mutation(s) within the areas targeted by this assay, and inadequate number of viral copies(<138 copies/mL). A negative result must be combined with clinical observations, patient history, and epidemiological information. The expected result is Negative.  Fact Sheet for Patients:  BloggerCourse.com  Fact Sheet for Healthcare Providers:  SeriousBroker.it  This test is no t yet approved or cleared by the [place] FDA and  has been authorized for detection and/or diagnosis of SARS-CoV-2 by FDA under an Emergency Use Authorization (EUA). This EUA will remain  in effect (meaning this test can be used) for the duration of the COVID-19 declaration under Section 564(b)(1) of the Act, 21 U.S.C.section 360bbb-3(b)(1), unless the authorization is terminated  or revoked sooner.        Influenza A by PCR NEGATIVE NEGATIVE Final   Influenza B by PCR NEGATIVE NEGATIVE Final    Comment: (NOTE) The Xpert Xpress SARS-CoV-2/FLU/RSV plus assay is intended as an aid in the diagnosis of influenza from Nasopharyngeal swab specimens and should not be used as a sole basis for treatment. Nasal washings and aspirates are unacceptable for Xpert Xpress SARS-CoV-2/FLU/RSV testing.  Fact Sheet for Patients: BloggerCourse.com  Fact Sheet for Healthcare Providers: SeriousBroker.it  This test is not yet approved  or cleared by the [place] and has been authorized for detection and/or diagnosis of SARS-CoV-2 by FDA under an Emergency Use Authorization (EUA). This EUA will remain in effect (meaning this test can be used) for the duration of the COVID-19 declaration under Section 564(b)(1) of the Act, 21 U.S.C. section 360bbb-3(b)(1), unless the authorization is terminated or revoked.  Performed at [facility], [place] [address] [place], [place] [address]   Resp Panel by RT-PCR (Flu A&B, Covid) Nasopharyngeal Swab     Status: None   Collection Time: 09/03/20  6:40 AM   Specimen: Nasopharyngeal Swab; Nasopharyngeal(NP) swabs in vial transport medium  Result Value Ref Range Status   SARS Coronavirus 2 by RT PCR NEGATIVE NEGATIVE Final    Comment: (NOTE) SARS-CoV-2 target nucleic acids are NOT DETECTED.  The SARS-CoV-2 RNA is generally detectable in upper respiratory specimens during the acute phase of infection. The lowest concentration of SARS-CoV-2 viral copies this assay can detect is 138 copies/mL. A negative result does not preclude SARS-Cov-2 infection and should not be used as the sole basis for treatment or other patient management decisions. A negative result may occur with  improper specimen collection/handling, submission of specimen other than nasopharyngeal swab, presence of viral mutation(s) within  the areas targeted by this assay, and inadequate number of viral copies(<138 copies/mL). A negative result must be combined with clinical observations, patient history, and epidemiological information. The expected result is Negative.  Fact Sheet for Patients:  EntrepreneurPulse.com.au  Fact Sheet for Healthcare Providers:  IncredibleEmployment.be  This test is no t yet approved or cleared by the [place] FDA and  has been authorized for detection and/or diagnosis of SARS-CoV-2 by FDA under an Emergency Use Authorization (EUA). This EUA will remain  in effect (meaning this test can be used) for the duration of the COVID-19 declaration under Section 564(b)(1) of the Act, 21 U.S.C.section 360bbb-3(b)(1), unless the authorization is terminated  or revoked sooner.       Influenza A by PCR NEGATIVE NEGATIVE Final   Influenza B by PCR NEGATIVE NEGATIVE Final    Comment: (NOTE) The Xpert Xpress SARS-CoV-2/FLU/RSV plus assay is intended as an aid in the diagnosis of influenza from Nasopharyngeal swab specimens and should not be used as a sole basis for treatment. Nasal washings and aspirates are unacceptable for Xpert Xpress SARS-CoV-2/FLU/RSV testing.  Fact Sheet for Patients: EntrepreneurPulse.com.au  Fact Sheet for Healthcare Providers: IncredibleEmployment.be  This test is not yet approved or cleared by the [place] FDA and has been authorized for detection and/or diagnosis of SARS-CoV-2 by FDA under an Emergency Use Authorization (EUA). This EUA will remain in effect (meaning this test can be used) for the duration of the COVID-19 declaration under Section 564(b)(1) of the Act, 21 U.S.C. section 360bbb-3(b)(1), unless the authorization is terminated or revoked.  Performed at [facility], [place] [address], [place], [place] [address]      Time coordinating discharge: 40  minutes  SIGNED:   Elmarie Shiley, MD  [facility]

## 2020-09-04 NOTE — Progress Notes (Signed)
09/04/2020 @ 2:48pm  TOC CM/CSW received a call from [facility] [place]/[facility] [phone].  Adrienne called for an update on pt.  CSW will continue to follow for dc needs.  Hancel Ion Tarpley-Carter, MSW, LCSW-A Pronouns:  She, Her, Hers                  [facility] Transitions of CareClinical Social Worker [email or web address] [phone]

## 2020-09-05 ENCOUNTER — Telehealth: Payer: Self-pay | Admitting: *Deleted

## 2020-09-05 NOTE — Telephone Encounter (Signed)
TOC CM received call from grand-dtr, Beth. Offered choice for Home Hospice. She is in agreement to [facility]. Pt has oxygen at home from previous agency and bedside commode. They will pick up DME on Friday. She has previous oxygen from [facility] she can use. Pt has a hospital bed at home. Contacted [facility] with new referral. Spoke to Drenda Freeze, she will follow up with grand-dtr about Home Hospice. Isidoro Donning RN CCM, [facility] TOC CM [phone]

## 2020-09-06 ENCOUNTER — Telehealth: Payer: Self-pay | Admitting: *Deleted

## 2020-09-06 NOTE — Telephone Encounter (Signed)
TOC CM received call from [facility] [place]. States they are not going to accept referral for Home Hospice. Attempted call to grand-dtr, Clelia Schaumann #[phone]. Left message for a return call. Isidoro Donning RN CCM, [facility] TOC CM [phone]

## 2020-09-10 DIAGNOSIS — J449 Chronic obstructive pulmonary disease, unspecified: Secondary | ICD-10-CM | POA: Diagnosis not present

## 2020-09-13 ENCOUNTER — Telehealth: Payer: Self-pay | Admitting: Internal Medicine

## 2020-09-13 NOTE — Telephone Encounter (Signed)
   Patient's daughter [name] calling, she is requesting documentation to confirm patient has been diagnosed with dementia/ Alzheimer's by Dr Sharlet Salina

## 2020-09-13 NOTE — Telephone Encounter (Signed)
If she is requesting medical records would need to fill out forms and send in. Also must be on DPR to request someone else's medical records.

## 2020-09-18 ENCOUNTER — Telehealth: Payer: Self-pay | Admitting: Internal Medicine

## 2020-09-18 NOTE — Telephone Encounter (Signed)
Tim w/ [facility] is requesting verbals for Hospice orders.    Okay to LVM: [phone]

## 2020-09-18 NOTE — Telephone Encounter (Signed)
Ok to give verbal orders? Please advise  

## 2020-09-18 NOTE — Telephone Encounter (Signed)
Spoke with Tim with [facility] to give verbal orders for hospice. He stated that once the patient starts on hospice Dr. Sharlet Salina will receive a report every 2 weeks about the patient so that everyone is on the same page.

## 2020-09-18 NOTE — Telephone Encounter (Signed)
Fine for hospice

## 2020-09-20 NOTE — Telephone Encounter (Signed)
We can't give any health info to people not listed on the DPR.

## 2020-09-20 NOTE — Telephone Encounter (Signed)
Spoke with [employer] and she stated that she doesn't need her grandmother's records. She stated that she just needs a letter with the date that she was diagnoses with Dementia/Alzheimer's. She is also aware of the DPR needing to be updated so that our office is able to speak with her on behalf of her grandmother.

## 2020-09-21 NOTE — Telephone Encounter (Signed)
LVM letting Beth know that we are unable to provide a diagnosis/letter for her due to her not being on the patient's DPR. I advised that they get this updated so we an proceed to help in anyway possible. Office number was provided in case she has any additional questions or concerns.

## 2020-10-02 DIAGNOSIS — J441 Chronic obstructive pulmonary disease with (acute) exacerbation: Secondary | ICD-10-CM | POA: Diagnosis not present

## 2020-10-02 DIAGNOSIS — I5041 Acute combined systolic (congestive) and diastolic (congestive) heart failure: Secondary | ICD-10-CM | POA: Diagnosis not present

## 2020-10-11 DIAGNOSIS — J449 Chronic obstructive pulmonary disease, unspecified: Secondary | ICD-10-CM | POA: Diagnosis not present

## 2020-10-15 ENCOUNTER — Other Ambulatory Visit: Payer: Self-pay | Admitting: Internal Medicine

## 2020-10-24 ENCOUNTER — Other Ambulatory Visit: Payer: Self-pay | Admitting: Internal Medicine

## 2020-11-08 DIAGNOSIS — J449 Chronic obstructive pulmonary disease, unspecified: Secondary | ICD-10-CM | POA: Diagnosis not present

## 2020-12-09 DIAGNOSIS — J449 Chronic obstructive pulmonary disease, unspecified: Secondary | ICD-10-CM | POA: Diagnosis not present

## 2020-12-21 ENCOUNTER — Other Ambulatory Visit: Payer: Self-pay | Admitting: Internal Medicine

## 2021-01-05 ENCOUNTER — Other Ambulatory Visit: Payer: Self-pay | Admitting: Internal Medicine

## 2021-01-06 ENCOUNTER — Other Ambulatory Visit: Payer: Self-pay | Admitting: Internal Medicine

## 2021-01-08 ENCOUNTER — Other Ambulatory Visit: Payer: Self-pay | Admitting: Internal Medicine

## 2021-01-08 DIAGNOSIS — J449 Chronic obstructive pulmonary disease, unspecified: Secondary | ICD-10-CM | POA: Diagnosis not present

## 2021-01-22 ENCOUNTER — Emergency Department (HOSPITAL_COMMUNITY)
Admission: EM | Admit: 2021-01-22 | Discharge: 2021-01-22 | Disposition: A | Discharge status: Home / Self Care | Payer: Medicare Other | Attending: Emergency Medicine | Admitting: Emergency Medicine

## 2021-01-22 ENCOUNTER — Emergency Department (HOSPITAL_COMMUNITY): Payer: Medicare Other

## 2021-01-22 DIAGNOSIS — U071 COVID-19: Secondary | ICD-10-CM | POA: Insufficient documentation

## 2021-01-22 DIAGNOSIS — Z743 Need for continuous supervision: Secondary | ICD-10-CM | POA: Diagnosis not present

## 2021-01-22 DIAGNOSIS — Z85828 Personal history of other malignant neoplasm of skin: Secondary | ICD-10-CM | POA: Diagnosis not present

## 2021-01-22 DIAGNOSIS — J45909 Unspecified asthma, uncomplicated: Secondary | ICD-10-CM | POA: Diagnosis not present

## 2021-01-22 DIAGNOSIS — R0789 Other chest pain: Secondary | ICD-10-CM | POA: Diagnosis not present

## 2021-01-22 DIAGNOSIS — I499 Cardiac arrhythmia, unspecified: Secondary | ICD-10-CM | POA: Diagnosis not present

## 2021-01-22 DIAGNOSIS — I5032 Chronic diastolic (congestive) heart failure: Secondary | ICD-10-CM | POA: Diagnosis not present

## 2021-01-22 DIAGNOSIS — E119 Type 2 diabetes mellitus without complications: Secondary | ICD-10-CM | POA: Diagnosis not present

## 2021-01-22 DIAGNOSIS — I110 Hypertensive heart disease with heart failure: Secondary | ICD-10-CM | POA: Insufficient documentation

## 2021-01-22 DIAGNOSIS — E039 Hypothyroidism, unspecified: Secondary | ICD-10-CM | POA: Diagnosis not present

## 2021-01-22 DIAGNOSIS — Z7951 Long term (current) use of inhaled steroids: Secondary | ICD-10-CM | POA: Insufficient documentation

## 2021-01-22 DIAGNOSIS — J441 Chronic obstructive pulmonary disease with (acute) exacerbation: Secondary | ICD-10-CM | POA: Diagnosis not present

## 2021-01-22 DIAGNOSIS — Z79899 Other long term (current) drug therapy: Secondary | ICD-10-CM | POA: Insufficient documentation

## 2021-01-22 DIAGNOSIS — R0602 Shortness of breath: Secondary | ICD-10-CM | POA: Diagnosis not present

## 2021-01-22 DIAGNOSIS — R079 Chest pain, unspecified: Secondary | ICD-10-CM | POA: Diagnosis not present

## 2021-01-22 DIAGNOSIS — Z8551 Personal history of malignant neoplasm of bladder: Secondary | ICD-10-CM | POA: Insufficient documentation

## 2021-01-22 DIAGNOSIS — R6889 Other general symptoms and signs: Secondary | ICD-10-CM | POA: Diagnosis not present

## 2021-01-22 LAB — URINALYSIS, ROUTINE W REFLEX MICROSCOPIC
Bilirubin Urine: NEGATIVE
Glucose, UA: NEGATIVE mg/dL
Ketones, ur: NEGATIVE mg/dL
Nitrite: NEGATIVE
Protein, ur: 30 mg/dL — AB
RBC / HPF: >50 RBC/hpf — ABNORMAL HIGH (ref 0–5)
Specific Gravity, Urine: 1.005 (ref 1.005–1.030)
pH: 7 (ref 5.0–8.0)

## 2021-01-22 LAB — CBC WITH DIFFERENTIAL/PLATELET
Abs Immature Granulocytes: 0.01 10*3/uL (ref 0.00–0.07)
Basophils Absolute: 0 10*3/uL (ref 0.0–0.1)
Basophils Relative: 1 %
Eosinophils Absolute: 0.4 10*3/uL (ref 0.0–0.5)
Eosinophils Relative: 7 %
HCT: 31.2 % — ABNORMAL LOW (ref 36.0–46.0)
Hemoglobin: 9.3 g/dL — ABNORMAL LOW (ref 12.0–15.0)
Immature Granulocytes: 0 %
Lymphocytes Relative: 20 %
Lymphs Abs: 1.2 10*3/uL (ref 0.7–4.0)
MCH: 27 pg (ref 26.0–34.0)
MCHC: 29.8 g/dL — ABNORMAL LOW (ref 30.0–36.0)
MCV: 90.7 fL (ref 80.0–100.0)
Monocytes Absolute: 0.9 10*3/uL (ref 0.1–1.0)
Monocytes Relative: 16 %
Neutro Abs: 3.2 10*3/uL (ref 1.7–7.7)
Neutrophils Relative %: 56 %
Platelets: 294 10*3/uL (ref 150–400)
RBC: 3.44 MIL/uL — ABNORMAL LOW (ref 3.87–5.11)
RDW: 17.2 % — ABNORMAL HIGH (ref 11.5–15.5)
WBC: 5.7 10*3/uL (ref 4.0–10.5)
nRBC: 0 % (ref 0.0–0.2)

## 2021-01-22 LAB — TROPONIN I (HIGH SENSITIVITY)
Troponin I (High Sensitivity): 20 ng/L — ABNORMAL HIGH (ref <18)
Troponin I (High Sensitivity): 20 ng/L — ABNORMAL HIGH (ref <18)

## 2021-01-22 LAB — COMPREHENSIVE METABOLIC PANEL
ALT: 17 U/L (ref 0–44)
AST: 30 U/L (ref 15–41)
Albumin: 3.2 g/dL — ABNORMAL LOW (ref 3.5–5.0)
Alkaline Phosphatase: 352 U/L — ABNORMAL HIGH (ref 38–126)
Anion gap: 9 (ref 5–15)
BUN: 19 mg/dL (ref 8–23)
CO2: 27 mmol/L (ref 22–32)
Calcium: 8.3 mg/dL — ABNORMAL LOW (ref 8.9–10.3)
Chloride: 99 mmol/L (ref 98–111)
Creatinine, Ser: 1.27 mg/dL — ABNORMAL HIGH (ref 0.44–1.00)
GFR, Estimated: 41 mL/min — ABNORMAL LOW (ref >60)
Glucose, Bld: 104 mg/dL — ABNORMAL HIGH (ref 70–99)
Potassium: 3.8 mmol/L (ref 3.5–5.1)
Sodium: 135 mmol/L (ref 135–145)
Total Bilirubin: 0.6 mg/dL (ref 0.3–1.2)
Total Protein: 6.5 g/dL (ref 6.5–8.1)

## 2021-01-22 LAB — RESP PANEL BY RT-PCR (FLU A&B, COVID) ARPGX2
Influenza A by PCR: NEGATIVE
Influenza B by PCR: NEGATIVE
SARS Coronavirus 2 by RT PCR: POSITIVE — AB

## 2021-01-22 LAB — BRAIN NATRIURETIC PEPTIDE: B Natriuretic Peptide: 133.2 pg/mL — ABNORMAL HIGH (ref 0.0–100.0)

## 2021-01-22 MED ORDER — NIRMATRELVIR/RITONAVIR (PAXLOVID) TABLET (RENAL DOSING): 2.0000 | ORAL_TABLET | Freq: Two times a day (BID) | ORAL | 0 refills | Status: AC

## 2021-01-22 MED ORDER — BENZONATATE 100 MG PO CAPS
200.0000 mg | ORAL_CAPSULE | Freq: Once | ORAL | Status: AC
  Administered 2021-01-22: 200 mg via ORAL
  Filled 2021-01-22: qty 2

## 2021-01-22 MED ORDER — BENZONATATE 200 MG PO CAPS: 200.0000 mg | ORAL_CAPSULE | Freq: Two times a day (BID) | ORAL | 0 refills | Status: DC | PRN

## 2021-01-22 MED ORDER — ALBUTEROL SULFATE (2.5 MG/3ML) 0.083% IN NEBU: 2.5000 mg | INHALATION_SOLUTION | Freq: Four times a day (QID) | RESPIRATORY_TRACT | 2 refills | Status: DC | PRN

## 2021-01-22 NOTE — ED Notes (Signed)
Pt d/c home per MD order. Discharge summary reviewed, pt verbalizes understanding. Pt family member here for discharge ride home. Off unit via WC; No s/s of acute distress noted at discharge.

## 2021-01-22 NOTE — ED Triage Notes (Signed)
Pt arrived via [facility] for cc SOB. EMS report increasing shortness of breath, chest tight cough over last few days. COVID+ family member in home. On 2L [place] at baseline.

## 2021-01-22 NOTE — ED Provider Notes (Signed)
[facility] EMERGENCY DEPARTMENT Provider Note   CSN: [id number] Arrival date & time: 01/22/21  2330     History Chief Complaint  Patient presents with  . Shortness of Breath    [name] Case is a 87 y.o. female.  Patient is an 87 year old female with a history of COPD, CHF, cardiomegaly on 2 L of oxygen chronically, hypertension, hyperlipidemia, diabetes, atrial fibrillation who is presenting today with complaint of shortness of breath, chest tightness and cough.  Symptoms started 2 days ago but were much worse when she woke up this morning.  She is not aware of any fever and cough is mostly dry but sometimes towards the end of coughing she can have some white sputum.  Laying down or walking does not make the pain any worse.  Coughing does not make the chest pain any worse.  She describes it as a dull ache throughout the front of her chest but it does not radiate.  She has no abdominal pain, nausea or vomiting.  The shortness of breath seems to be present all the time.  She has been checking her weight regularly and it has not changed and she has not noticed any swelling in her lower extremities.  She has been eating and drinking still.  Patient does report that her grandson who she shares a room with tested positive for COVID last week.  She has not tested herself.  The history is provided by the patient.  Shortness of Breath      Past Medical History:  Diagnosis Date  . Allergy   . Arthritis   . Atrial fibrillation ([place])   . Bronchitis   . Cancer ([place])    Bilateral upper tract transitional cell carcinoma  . Cardiomegaly   . CHF (congestive heart failure) ([place])   . Complication of anesthesia    Difficult to arouse  . COPD (chronic obstructive pulmonary disease) ([facility])    borderline   pt. denies at preop  . Diabetes mellitus without complication ([place])    Controlled by diet and exercise  . Diverticulosis of sigmoid colon   . Family history of bladder  cancer   . Family history of colon cancer   . Family history of kidney cancer   . Fatty liver   . GERD (gastroesophageal reflux disease)   . History of hiatal hernia 01/20/2015   Small sliding, noted on CT  . History of kidney stones   . Hyperlipidemia   . Hypertension   . Hypothyroidism   . Lynch syndrome   . PONV (postoperative nausea and vomiting)   . Supraumbilical hernia 07/62/2633   Small noted on CT    Patient Active Problem List   Diagnosis Date Noted  . Acute viral syndrome 09/02/2020  . Symptomatic anemia 09/02/2020  . Gross hematuria 03/02/2020  . Candidal skin infection 03/02/2020  . Degenerative arthritis of right knee 11/09/2019  . COPD with acute exacerbation ([place]) 07/30/2018  . Lynch syndrome 05/05/2018  . Genetic testing 04/28/2018  . Adjustment disorder 04/23/2018  . Urothelial cancer ([place]) 04/12/2018  . Lichen planus 35/45/6256  . Obesity hypoventilation syndrome ([place])   . Hypokalemia   . Chronic diastolic (congestive) heart failure ([place]) 01/09/2016  . History of snoring 05/20/2015  . Chronic respiratory failure with hypoxia ([place])   . Type 2 diabetes mellitus without complication ([place])   . Atrial fibrillation ([place]) 01/06/2015  . Hypothyroidism 03/09/2007  . Hyperlipidemia 03/09/2007  . Morbid obesity ([place]) 03/09/2007  . Essential  hypertension 03/09/2007  . ALLERGIC RHINITIS 03/09/2007  . Asthma 03/09/2007  . GERD 03/09/2007  . INCONTINENCE 03/09/2007    Past Surgical History:  Procedure Laterality Date  . ABDOMINAL HYSTERECTOMY    . APPENDECTOMY    . CHOLECYSTECTOMY    . COLONOSCOPY  07/16/2009  . CYSTOSCOPY W/ URETERAL STENT PLACEMENT Right 12/04/2017   Procedure: CYSTOSCOPY WITH RETROGRADE PYELOGRAM/URETERAL STENT PLACEMENT;  Surgeon: Festus Aloe, MD;  Location: [facility];  Service: Urology;  Laterality: Right;  . CYSTOSCOPY W/ URETERAL STENT PLACEMENT Right 02/26/2018   Procedure: CYSTOSCOPY WITH RETROGRADE PYELOGRAM/URETERAL STENT  PLACEMENT;  Surgeon: Kathie Rhodes, MD;  Location: [facility];  Service: Urology;  Laterality: Right;  . CYSTOSCOPY WITH URETEROSCOPY AND STENT PLACEMENT Bilateral 01/29/2018   Procedure: BILATERAL URETEROSCOPY AND TUMOR RESECTION  WITH LASER RIGHT STENT EXCHANGE LEFT STENT PLACEMENT;  Surgeon: Ardis Hughs, MD;  Location: [facility];  Service: Urology;  Laterality: Bilateral;  . CYSTOSCOPY WITH URETEROSCOPY AND STENT PLACEMENT Bilateral 02/10/2018   Procedure: BILATERAL URETEROSCOPY AND TUMOR EXCISION BILATERAL STENT EXCHANGE;  Surgeon: Ardis Hughs, MD;  Location: [facility];  Service: Urology;  Laterality: Bilateral;  . CYSTOSCOPY/RETROGRADE/URETEROSCOPY Bilateral 12/24/2017   Procedure: BILATERAL URETEROSCOPY, RIGHT STENT EXCHANGE, STONE EXTRACTION WITH BASKET BILATERAL RETROGRADE PYELOGRAM, BIOPSY OF RIGHT UPPER POLE TUMOR,  BILATERAL RENAL PELVIS FLUID SENT FOR CYTOLOGY;  Surgeon: Ardis Hughs, MD;  Location: [facility];  Service: Urology;  Laterality: Bilateral;  . CYSTOSCOPY/URETEROSCOPY/HOLMIUM LASER/STENT PLACEMENT Bilateral 06/03/2018   Procedure: CYSTOSCOPY/BILATERAL URETEROSCOPY/ LASER TUMOR ABLATION BILATERAL / BILATERAL STENT PLACEMENT/BILATERAL RETROGRADE PYELOGRAM/BLADDER BIOPSY WITH FULGURATION;  Surgeon: Ardis Hughs, MD;  Location: [facility];  Service: Urology;  Laterality: Bilateral;  . HOLMIUM LASER APPLICATION Bilateral 20/05/4708   Procedure: HOLMIUM LASER APPLICATION;  Surgeon: Ardis Hughs, MD;  Location: [facility];  Service: Urology;  Laterality: Bilateral;  . KNEE SURGERY Right   . KNEE SURGERY Left    torn ligament repair  . UPPER GI ENDOSCOPY    . ureteroscopy laser tumor ablation bilateral stent placement     Dr. Louis Meckel 06-03-18      OB History   No obstetric history on file.     Family History  Problem Relation Age of Onset  . Heart disease Mother   . Colon cancer Mother 88       d. 9  . Tuberculosis Father   . Bladder Cancer Brother         urothelial cancer  . Bladder Cancer Brother        ureter cancer  . Kidney cancer Brother   . Skin cancer Sister   . Cancer Maternal Aunt        NOS  . Colon cancer Other        dx 1st time under 89, second time at 53  . Lung cancer Other   . Muscular dystrophy Other        d. 7  . Testicular cancer Other 24       great nephew  . Alcohol abuse Daughter   . Drug abuse Daughter   . Schizophrenia Daughter   . Colon cancer Grandchild     Social History   Tobacco Use  . Smoking status: Never Smoker  . Smokeless tobacco: Never Used  Vaping Use  . Vaping Use: Never used  Substance Use Topics  . Alcohol use: No    Alcohol/week: 0.0 standard drinks  . Drug use: No    Home Medications Prior to Admission medications  Medication Sig Start Date End Date Taking? Authorizing Provider  acetaminophen (TYLENOL) 500 MG tablet Take 1,000 mg by mouth every 4 (four) hours as needed for moderate pain (in conjunection with Oxycodone IR 5 mg tablets).    [provider]  albuterol (PROVENTIL) (2.5 MG/3ML) 0.083% nebulizer solution Take 3 mLs (2.5 mg total) by nebulization every 6 (six) hours. 09/03/20   Regalado, Cassie Freer, MD  AMBULATORY NON FORMULARY MEDICATION Rolator 11/09/19   Lyndal Pulley, DO  atorvastatin (LIPITOR) 20 MG tablet TAKE 1 TABLET BY MOUTH DAILY AT 6 PM 10/15/20   Hoyt Koch, MD  benzonatate (TESSALON) 200 MG capsule Take 1 capsule (200 mg total) by mouth 2 (two) times daily as needed for cough. 09/03/20   Regalado, Belkys A, MD  calcium carbonate (TUMS EX) 750 MG chewable tablet Chew 1 tablet by mouth as needed for heartburn.    [provider]  dextromethorphan-guaiFENesin (MUCINEX DM) 30-600 MG 12hr tablet Take 1 tablet by mouth 2 (two) times daily. 09/03/20   Regalado, Belkys A, MD  diltiazem (CARDIZEM CD) 180 MG 24 hr capsule TAKE 1 CAPSULE BY MOUTH EVERY DAY 10/15/20   Hoyt Koch, MD  furosemide (LASIX) 40 MG tablet TAKE 2 TABLETS BY  MOUTH EVERY DAY 01/09/21   Hoyt Koch, MD  ipratropium (ATROVENT HFA) 17 MCG/ACT inhaler Inhale 1 puff into the lungs 3 (three) times daily. 09/03/20 09/03/21  Regalado, Jerald Kief A, MD  levothyroxine (SYNTHROID) 100 MCG tablet TAKE 1 TABLET BY MOUTH EVERY DAY Patient taking differently: Take 100 mcg by mouth daily before breakfast. 05/28/20   Hoyt Koch, MD  LORazepam (ATIVAN) 0.5 MG tablet Take 0.5 mg by mouth every 4 (four) hours as needed for anxiety.    [provider]  metoprolol succinate (TOPROL-XL) 50 MG 24 hr tablet TAKE 1 TABLET BY MOUTH DAILY WITH OR IMMEDIATELY FOLLOWING A MEAL. 10/15/20   Hoyt Koch, MD  nystatin ointment (MYCOSTATIN) Apply 1 application topically 2 (two) times daily. Patient taking differently: Apply 1 application topically See admin instructions. Apply to affected areas 2 times a day 03/02/20   Hoyt Koch, MD  oxyCODONE (OXY IR/ROXICODONE) 5 MG immediate release tablet Take 5-10 mg by mouth every 4 (four) hours as needed (for pain).    [provider]  OXYGEN Inhale 2 L/min into the lungs continuous.    [provider]  potassium chloride SA (KLOR-CON M20) 20 MEQ tablet Take 1 tablet (20 mEq total) by mouth daily. Please call our office to schedule a follow up to receive additional refills. 12/21/20   Hoyt Koch, MD  promethazine (PHENERGAN) 25 MG tablet Take 25 mg by mouth every 8 (eight) hours as needed for nausea or vomiting.    [provider]  ipratropium (ATROVENT) 0.02 % nebulizer solution Take 5 mLs (1 mg total) by nebulization every 6 (six) hours as needed for wheezing or shortness of breath. Patient not taking: Reported on 09/02/2020 08/04/18 09/02/20  Georgette Shell, MD  irbesartan (AVAPRO) 150 MG tablet TAKE 1/2 TABLET BY MOUTH EVERY DAY Patient not taking: Reported on 09/02/2020 03/13/20 09/02/20  Hoyt Koch, MD    Allergies    Eliquis [apixaban], Oxycodone, and  Codeine  Review of Systems   Review of Systems  Respiratory: Positive for shortness of breath.   All other systems reviewed and are negative.   Physical Exam Updated Vital Signs BP (!) 102/57   Pulse 83   Temp  98.1 F (36.7 C) (Oral)   Resp 18   LMP  (LMP Unknown)   SpO2 100%   Physical Exam Vitals and nursing note reviewed.  Constitutional:      General: She is not in acute distress.    Appearance: She is well-developed.  HENT:     Head: Normocephalic and atraumatic.     Nose: Nose normal.     Mouth/Throat:     Mouth: Mucous membranes are moist.  Eyes:     Pupils: Pupils are equal, round, and reactive to light.  Neck:     Comments: No notable JVD Cardiovascular:     Rate and Rhythm: Normal rate. Rhythm irregularly irregular.     Heart sounds: Normal heart sounds. No murmur heard. No friction rub.  Pulmonary:     Effort: Pulmonary effort is normal. No tachypnea or accessory muscle usage.     Breath sounds: Examination of the right-lower field reveals decreased breath sounds. Examination of the left-lower field reveals decreased breath sounds. Decreased breath sounds present. No wheezing or rales.  Chest:     Chest wall: No tenderness.  Abdominal:     General: Bowel sounds are normal. There is no distension.     Palpations: Abdomen is soft.     Tenderness: There is no abdominal tenderness. There is no guarding or rebound.  Musculoskeletal:        General: No tenderness. Normal range of motion.     Cervical back: Neck supple.     Right lower leg: No edema.     Left lower leg: No edema.     Comments: No edema  Skin:    General: Skin is warm and dry.     Findings: No rash.  Neurological:     General: No focal deficit present.     Mental Status: She is alert. Mental status is at baseline.     Cranial Nerves: No cranial nerve deficit.  Psychiatric:        Mood and Affect: Mood normal.        Behavior: Behavior normal.     ED Results / Procedures / Treatments    Labs (all labs ordered are listed, but only abnormal results are displayed) Labs Reviewed  RESP PANEL BY RT-PCR (FLU A&B, COVID) ARPGX2 - Abnormal; Notable for the following components:      Result Value   SARS Coronavirus 2 by RT PCR POSITIVE (*)    All other components within normal limits  CBC WITH DIFFERENTIAL/PLATELET - Abnormal; Notable for the following components:   RBC 3.44 (*)    Hemoglobin 9.3 (*)    HCT 31.2 (*)    MCHC 29.8 (*)    RDW 17.2 (*)    All other components within normal limits  COMPREHENSIVE METABOLIC PANEL - Abnormal; Notable for the following components:   Glucose, Bld 104 (*)    Creatinine, Ser 1.27 (*)    Calcium 8.3 (*)    Albumin 3.2 (*)    Alkaline Phosphatase 352 (*)    GFR, Estimated 41 (*)    All other components within normal limits  BRAIN NATRIURETIC PEPTIDE - Abnormal; Notable for the following components:   B Natriuretic Peptide 133.2 (*)    All other components within normal limits  URINALYSIS, ROUTINE W REFLEX MICROSCOPIC - Abnormal; Notable for the following components:   APPearance HAZY (*)    Hgb urine dipstick LARGE (*)    Protein, ur 30 (*)    Leukocytes,Ua TRACE (*)  RBC / HPF >50 (*)    Bacteria, UA RARE (*)    All other components within normal limits  TROPONIN I (HIGH SENSITIVITY) - Abnormal; Notable for the following components:   Troponin I (High Sensitivity) 20 (*)    All other components within normal limits  TROPONIN I (HIGH SENSITIVITY)    EKG EKG Interpretation  Date/Time:  Tuesday Jan 22 2021 07:09:39 EDT Ventricular Rate:  77 PR Interval:    QRS Duration: 115 QT Interval:  442 QTC Calculation: 501 R Axis:   57 Text Interpretation: Atrial fibrillation Incomplete left bundle branch block Low voltage, precordial leads No significant change since last tracing Confirmed by Blanchie Dessert ([id number]) on 01/22/2021 7:35:39 AM   Radiology DG Chest Port 1 View  Result Date: 01/22/2021 CLINICAL DATA:  Cough and  chest pain several days, recent COVID exposure EXAM: PORTABLE CHEST 1 VIEW COMPARISON:  09/02/2020 FINDINGS: Cardiac shadow is mildly prominent but stable. Lungs are well aerated bilaterally. No focal infiltrate or sizable effusion is seen. No bony abnormality is noted. IMPRESSION: No acute abnormality seen. Electronically Signed   By: Inez Catalina M.D.   On: 01/22/2021 08:05    Procedures Procedures   Medications Ordered in ED Medications  benzonatate (TESSALON) capsule 200 mg (has no administration in time range)    ED Course  I have reviewed the triage vital signs and the nursing notes.  Pertinent labs & imaging results that were available during my care of the patient were reviewed by me and considered in my medical decision making (see chart for details).    MDM Rules/Calculators/A&P                          Elderly female presenting with 3 days of cough, chest pain and shortness of breath that has worsened in the last 12 hours.  Cough has been relatively nonproductive.  On exam she complains of her chest hurting but it is better than when she was at home.  She has not taken anything for this.  She does have a history of cardiomyopathy and CHF but no prior MI.  She also has atrial fibrillation but at this time is not anticoagulated.  Patient's vital signs are rate controlled A. fib, oxygen saturation of 100% on her home 2 L and normal oral temperature.  Of note patient's grandson tested positive for COVID 1 week ago and they share a room.  Patient has not tested herself but concerned that she may have COVID.  She does not have excessive wheezing on exam or concern for COPD exacerbation at this time.  She does not look particularly fluid overloaded on exam but will ensure BNP has not changed.  We will also get x-ray to ensure no evidence of pneumonia.  Labs and imaging are pending.  Patient given Ladona Ridgel for her cough.  10:26 AM Patient's labs are reassuring with improved BNP,  stable creatinine, hemoglobin of 9.3 which is improved from prior.  Chest x-ray without acute findings.  Troponin is slightly more elevated than it was in January and is 20 today.  In January it was 12.  Will repeat troponin.  On repeat evaluation patient reports she is feeling much better.  She does have a nebulizer at home but does not have any medication for it.  Her COVID is positive and since her symptoms have only been for 3 days she is a candidate for an antiviral.  We will do  PAXlovid confirmed with daughter she is no longer on statin.  Sats have been 100% on her home 2 L since arrival.  Feel that she will be a candidate to go home assuming her delta troponin is no worse.  11:43 AM Spoke with patient's granddaughter [name] discussing the results.  Patient does have hemoglobin in the urine but no signs of infection and this seems to be a recurrent problem.  Delta troponin is the same 20 low suspicion there is an acute cardiac etiology at this time.  Patient sent with Ladona Ridgel, albuterol nebulizer to use as needed and Paxlovid.  MDM Number of Diagnoses or Management Options   Amount and/or Complexity of Data Reviewed Clinical lab tests: ordered and reviewed Tests in the radiology section of CPT: ordered and reviewed Tests in the medicine section of CPT: ordered and reviewed Independent visualization of images, tracings, or specimens: yes  Patient Progress Patient progress: stable    Final Clinical Impression(s) / ED Diagnoses Final diagnoses:  COVID    Rx / DC Orders ED Discharge Orders         Ordered    albuterol (PROVENTIL) (2.5 MG/3ML) 0.083% nebulizer solution  Every 6 hours PRN        01/22/21 1142    benzonatate (TESSALON) 200 MG capsule  2 times daily PRN        01/22/21 1142    nirmatrelvir/ritonavir EUA, renal dosing, (PAXLOVID) TABS  2 times daily        01/22/21 1142           Blanchie Dessert, MD 01/22/21 1148

## 2021-01-22 NOTE — Discharge Instructions (Signed)
Return to the ER if you have worsening shortness of breath, vomiting, high fever, passing out or unable to eat or drink.

## 2021-01-25 ENCOUNTER — Other Ambulatory Visit: Payer: Self-pay | Admitting: Internal Medicine

## 2021-02-03 ENCOUNTER — Emergency Department (HOSPITAL_COMMUNITY)
Admission: EM | Admit: 2021-02-03 | Discharge: 2021-02-04 | Disposition: A | Discharge status: Home / Self Care | Payer: Medicare Other | Attending: Emergency Medicine | Admitting: Emergency Medicine

## 2021-02-03 ENCOUNTER — Other Ambulatory Visit: Payer: Self-pay

## 2021-02-03 ENCOUNTER — Emergency Department (HOSPITAL_COMMUNITY): Payer: Medicare Other

## 2021-02-03 ENCOUNTER — Encounter (HOSPITAL_COMMUNITY): Payer: Self-pay | Admitting: Emergency Medicine

## 2021-02-03 DIAGNOSIS — I110 Hypertensive heart disease with heart failure: Secondary | ICD-10-CM | POA: Insufficient documentation

## 2021-02-03 DIAGNOSIS — Z8616 Personal history of COVID-19: Secondary | ICD-10-CM | POA: Insufficient documentation

## 2021-02-03 DIAGNOSIS — I5032 Chronic diastolic (congestive) heart failure: Secondary | ICD-10-CM | POA: Diagnosis not present

## 2021-02-03 DIAGNOSIS — Z79899 Other long term (current) drug therapy: Secondary | ICD-10-CM | POA: Diagnosis not present

## 2021-02-03 DIAGNOSIS — E119 Type 2 diabetes mellitus without complications: Secondary | ICD-10-CM | POA: Diagnosis not present

## 2021-02-03 DIAGNOSIS — E039 Hypothyroidism, unspecified: Secondary | ICD-10-CM | POA: Insufficient documentation

## 2021-02-03 DIAGNOSIS — Z85828 Personal history of other malignant neoplasm of skin: Secondary | ICD-10-CM | POA: Insufficient documentation

## 2021-02-03 DIAGNOSIS — Z8551 Personal history of malignant neoplasm of bladder: Secondary | ICD-10-CM | POA: Diagnosis not present

## 2021-02-03 DIAGNOSIS — R319 Hematuria, unspecified: Secondary | ICD-10-CM | POA: Diagnosis not present

## 2021-02-03 DIAGNOSIS — N3001 Acute cystitis with hematuria: Secondary | ICD-10-CM | POA: Insufficient documentation

## 2021-02-03 DIAGNOSIS — J441 Chronic obstructive pulmonary disease with (acute) exacerbation: Secondary | ICD-10-CM | POA: Insufficient documentation

## 2021-02-03 DIAGNOSIS — Z743 Need for continuous supervision: Secondary | ICD-10-CM | POA: Diagnosis not present

## 2021-02-03 DIAGNOSIS — Z7951 Long term (current) use of inhaled steroids: Secondary | ICD-10-CM | POA: Insufficient documentation

## 2021-02-03 LAB — CBC WITH DIFFERENTIAL/PLATELET
Abs Immature Granulocytes: 0.03 10*3/uL (ref 0.00–0.07)
Basophils Absolute: 0.1 10*3/uL (ref 0.0–0.1)
Basophils Relative: 1 %
Eosinophils Absolute: 0.3 10*3/uL (ref 0.0–0.5)
Eosinophils Relative: 4 %
HCT: 27.3 % — ABNORMAL LOW (ref 36.0–46.0)
Hemoglobin: 8.3 g/dL — ABNORMAL LOW (ref 12.0–15.0)
Immature Granulocytes: 0 %
Lymphocytes Relative: 18 %
Lymphs Abs: 1.6 10*3/uL (ref 0.7–4.0)
MCH: 27.3 pg (ref 26.0–34.0)
MCHC: 30.4 g/dL (ref 30.0–36.0)
MCV: 89.8 fL (ref 80.0–100.0)
Monocytes Absolute: 0.6 10*3/uL (ref 0.1–1.0)
Monocytes Relative: 7 %
Neutro Abs: 6.4 10*3/uL (ref 1.7–7.7)
Neutrophils Relative %: 70 %
Platelets: 376 10*3/uL (ref 150–400)
RBC: 3.04 MIL/uL — ABNORMAL LOW (ref 3.87–5.11)
RDW: 16.6 % — ABNORMAL HIGH (ref 11.5–15.5)
WBC: 9 10*3/uL (ref 4.0–10.5)
nRBC: 0 % (ref 0.0–0.2)

## 2021-02-03 LAB — URINALYSIS, ROUTINE W REFLEX MICROSCOPIC
Glucose, UA: 100 mg/dL — AB
Ketones, ur: 15 mg/dL — AB
Nitrite: POSITIVE — AB
Protein, ur: >300 mg/dL — AB
Specific Gravity, Urine: 1.01 (ref 1.005–1.030)
pH: 8.5 — ABNORMAL HIGH (ref 5.0–8.0)

## 2021-02-03 LAB — URINALYSIS, MICROSCOPIC (REFLEX): RBC / HPF: >50 RBC/hpf (ref 0–5)

## 2021-02-03 LAB — BASIC METABOLIC PANEL
Anion gap: 7 (ref 5–15)
BUN: 22 mg/dL (ref 8–23)
CO2: 25 mmol/L (ref 22–32)
Calcium: 8.9 mg/dL (ref 8.9–10.3)
Chloride: 106 mmol/L (ref 98–111)
Creatinine, Ser: 1.17 mg/dL — ABNORMAL HIGH (ref 0.44–1.00)
GFR, Estimated: 45 mL/min — ABNORMAL LOW (ref >60)
Glucose, Bld: 113 mg/dL — ABNORMAL HIGH (ref 70–99)
Potassium: 3.9 mmol/L (ref 3.5–5.1)
Sodium: 138 mmol/L (ref 135–145)

## 2021-02-03 LAB — TYPE AND SCREEN
ABO/RH(D): O POS
Antibody Screen: NEGATIVE

## 2021-02-03 MED ORDER — SODIUM CHLORIDE 0.9 % IV SOLN
1.0000 g | Freq: Once | INTRAVENOUS | Status: AC
  Administered 2021-02-03: 1 g via INTRAVENOUS
  Filled 2021-02-03: qty 10

## 2021-02-03 NOTE — ED Triage Notes (Addendum)
Pt to triage via [facility] from home- hospice pt.  C/o hematuria x 2 days.  History of bladder CA.  20g L hand.  CBG 112.  DNR form with pt.  Denies pain.  Wears 2L O2 at home.

## 2021-02-03 NOTE — ED Provider Notes (Signed)
Emergency Medicine Provider Triage Evaluation Note  [name] Case , a 87 y.o. female  was evaluated in triage.  Per EMS family refused transport to [place].   Patient reports that she is having increased hematuria.  She denies pelvic pain.  She complains of being cold.   Review of Systems  Positive: Difficulty urinating Negative: fever  Physical Exam  LMP  (LMP Unknown)  Gen:   Awake, no distress   Resp:  Normal effort  MSK:   Moves extremities without difficulty  Other:  On 2 lpm  Medical Decision Making  Medically screening exam initiated at 5:23 PM.  Appropriate orders placed.  DENYS LABREE was informed that the remainder of the evaluation will be completed by another provider, this initial triage assessment does not replace that evaluation, and the importance of remaining in the ED until their evaluation is complete.     Lorin Glass, [place] 02/03/21 1740    Valarie Merino, MD 02/03/21 2051

## 2021-02-03 NOTE — ED Provider Notes (Signed)
[facility] DEPARTMENT Provider Note   CSN: [id number] Arrival date & time: 02/03/21  1714     History Chief Complaint  Patient presents with  . Hematuria    [name] Case is a 87 y.o. female with past medical history significant for Lynch syndrome, CHF, A. Fib, COVID + on on 01/18/2021. Not anticoagulated. DNR form at the bedside.  HPI Patient presents to emergency room today with chief complaint of hematuria x3 days.  She states she has a history of intermittent hematuria that she describes as light pink blood in her urine.  She states since symptom onset this time she has been passing bright red blood in her urine as well as large blood clots.  Patient endorses urinary retention and dysuria which is new. She has not been on antibiotics recently.  She states she has history of UTI however it has been "quite sometime" since she has had one. She is also reporting feeling cold and eating ice lately which is abnormal for her per granddaughter at the bedside.  Patient's daughter is POA however is unable to be here in the hospital right now so granddaughter is here with patient.  Patient is under hospice care.  She denies any fever, chills, back pain, nausea, emesis, chest pain, palpitations, syncope, shortness of breath, generalized weakness, bruising, pelvic pain, vaginal discharge.    Past Medical History:  Diagnosis Date  . Allergy   . Arthritis   . Atrial fibrillation ([place])   . Bronchitis   . Cancer ([place])    Bilateral upper tract transitional cell carcinoma  . Cardiomegaly   . CHF (congestive heart failure) ([place])   . Complication of anesthesia    Difficult to arouse  . COPD (chronic obstructive pulmonary disease) ([facility])    borderline   pt. denies at preop  . Diabetes mellitus without complication ([place])    Controlled by diet and exercise  . Diverticulosis of sigmoid colon   . Family history of bladder cancer   . Family history of colon cancer   .  Family history of kidney cancer   . Fatty liver   . GERD (gastroesophageal reflux disease)   . History of hiatal hernia 01/20/2015   Small sliding, noted on CT  . History of kidney stones   . Hyperlipidemia   . Hypertension   . Hypothyroidism   . Lynch syndrome   . PONV (postoperative nausea and vomiting)   . Supraumbilical hernia 87/56/4332   Small noted on CT    Patient Active Problem List   Diagnosis Date Noted  . Acute viral syndrome 09/02/2020  . Symptomatic anemia 09/02/2020  . Gross hematuria 03/02/2020  . Candidal skin infection 03/02/2020  . Degenerative arthritis of right knee 11/09/2019  . COPD with acute exacerbation ([place]) 07/30/2018  . Lynch syndrome 05/05/2018  . Genetic testing 04/28/2018  . Adjustment disorder 04/23/2018  . Urothelial cancer ([place]) 04/12/2018  . Lichen planus 95/18/8416  . Obesity hypoventilation syndrome ([place])   . Hypokalemia   . Chronic diastolic (congestive) heart failure ([place]) 01/09/2016  . History of snoring 05/20/2015  . Chronic respiratory failure with hypoxia ([place])   . Type 2 diabetes mellitus without complication ([place])   . Atrial fibrillation ([place]) 01/06/2015  . Hypothyroidism 03/09/2007  . Hyperlipidemia 03/09/2007  . Morbid obesity ([place]) 03/09/2007  . Essential hypertension 03/09/2007  . ALLERGIC RHINITIS 03/09/2007  . Asthma 03/09/2007  . GERD 03/09/2007  . INCONTINENCE 03/09/2007    Past Surgical History:  Procedure Laterality Date  . ABDOMINAL HYSTERECTOMY    . APPENDECTOMY    . CHOLECYSTECTOMY    . COLONOSCOPY  07/16/2009  . CYSTOSCOPY W/ URETERAL STENT PLACEMENT Right 12/04/2017   Procedure: CYSTOSCOPY WITH RETROGRADE PYELOGRAM/URETERAL STENT PLACEMENT;  Surgeon: Festus Aloe, MD;  Location: [facility];  Service: Urology;  Laterality: Right;  . CYSTOSCOPY W/ URETERAL STENT PLACEMENT Right 02/26/2018   Procedure: CYSTOSCOPY WITH RETROGRADE PYELOGRAM/URETERAL STENT PLACEMENT;  Surgeon: Kathie Rhodes, MD;  Location: [facility];  Service: Urology;  Laterality: Right;  . CYSTOSCOPY WITH URETEROSCOPY AND STENT PLACEMENT Bilateral 01/29/2018   Procedure: BILATERAL URETEROSCOPY AND TUMOR RESECTION  WITH LASER RIGHT STENT EXCHANGE LEFT STENT PLACEMENT;  Surgeon: Ardis Hughs, MD;  Location: [facility];  Service: Urology;  Laterality: Bilateral;  . CYSTOSCOPY WITH URETEROSCOPY AND STENT PLACEMENT Bilateral 02/10/2018   Procedure: BILATERAL URETEROSCOPY AND TUMOR EXCISION BILATERAL STENT EXCHANGE;  Surgeon: Ardis Hughs, MD;  Location: [facility];  Service: Urology;  Laterality: Bilateral;  . CYSTOSCOPY/RETROGRADE/URETEROSCOPY Bilateral 12/24/2017   Procedure: BILATERAL URETEROSCOPY, RIGHT STENT EXCHANGE, STONE EXTRACTION WITH BASKET BILATERAL RETROGRADE PYELOGRAM, BIOPSY OF RIGHT UPPER POLE TUMOR,  BILATERAL RENAL PELVIS FLUID SENT FOR CYTOLOGY;  Surgeon: Ardis Hughs, MD;  Location: [facility];  Service: Urology;  Laterality: Bilateral;  . CYSTOSCOPY/URETEROSCOPY/HOLMIUM LASER/STENT PLACEMENT Bilateral 06/03/2018   Procedure: CYSTOSCOPY/BILATERAL URETEROSCOPY/ LASER TUMOR ABLATION BILATERAL / BILATERAL STENT PLACEMENT/BILATERAL RETROGRADE PYELOGRAM/BLADDER BIOPSY WITH FULGURATION;  Surgeon: Ardis Hughs, MD;  Location: [facility];  Service: Urology;  Laterality: Bilateral;  . HOLMIUM LASER APPLICATION Bilateral 38/11/6657   Procedure: HOLMIUM LASER APPLICATION;  Surgeon: Ardis Hughs, MD;  Location: [facility];  Service: Urology;  Laterality: Bilateral;  . KNEE SURGERY Right   . KNEE SURGERY Left    torn ligament repair  . UPPER GI ENDOSCOPY    . ureteroscopy laser tumor ablation bilateral stent placement     Dr. Louis Meckel 06-03-18      OB History   No obstetric history on file.     Family History  Problem Relation Age of Onset  . Heart disease Mother   . Colon cancer Mother 23       d. 81  . Tuberculosis Father   . Bladder Cancer Brother        urothelial cancer  . Bladder Cancer Brother         ureter cancer  . Kidney cancer Brother   . Skin cancer Sister   . Cancer Maternal Aunt        NOS  . Colon cancer Other        dx 1st time under 89, second time at 23  . Lung cancer Other   . Muscular dystrophy Other        d. 10  . Testicular cancer Other 24       great nephew  . Alcohol abuse Daughter   . Drug abuse Daughter   . Schizophrenia Daughter   . Colon cancer Grandchild     Social History   Tobacco Use  . Smoking status: Never Smoker  . Smokeless tobacco: Never Used  Vaping Use  . Vaping Use: Never used  Substance Use Topics  . Alcohol use: No    Alcohol/week: 0.0 standard drinks  . Drug use: No    Home Medications Prior to Admission medications   Medication Sig Start Date End Date Taking? Authorizing Provider  ciprofloxacin (CIPRO) 500 MG tablet Take 1 tablet (500 mg total) by mouth every  12 (twelve) hours for 7 days. 02/04/21 02/11/21 Yes Walisiewicz, Finnegan Gatta E, PA-C  acetaminophen (TYLENOL) 500 MG tablet Take 1,000 mg by mouth every 4 (four) hours as needed for moderate pain (in conjunection with Oxycodone IR 5 mg tablets).    [provider]  albuterol (PROVENTIL) (2.5 MG/3ML) 0.083% nebulizer solution Take 3 mLs (2.5 mg total) by nebulization every 6 (six) hours as needed for wheezing or shortness of breath. 01/22/21   Blanchie Dessert, MD  AMBULATORY NON FORMULARY MEDICATION [place] 11/09/19   Lyndal Pulley, DO  atorvastatin (LIPITOR) 20 MG tablet TAKE 1 TABLET BY MOUTH DAILY AT 6 PM 01/25/21   Hoyt Koch, MD  benzonatate (TESSALON) 200 MG capsule Take 1 capsule (200 mg total) by mouth 2 (two) times daily as needed for cough. 01/22/21   Blanchie Dessert, MD  calcium carbonate (TUMS EX) 750 MG chewable tablet Chew 1 tablet by mouth as needed for heartburn.    [provider]  dextromethorphan-guaiFENesin (MUCINEX DM) 30-600 MG 12hr tablet Take 1 tablet by mouth 2 (two) times daily. 09/03/20   Regalado, Belkys A, MD  diltiazem  (CARDIZEM CD) 180 MG 24 hr capsule TAKE 1 CAPSULE BY MOUTH EVERY DAY 10/15/20   Hoyt Koch, MD  furosemide (LASIX) 40 MG tablet TAKE 2 TABLETS BY MOUTH EVERY DAY 01/09/21   Hoyt Koch, MD  ipratropium (ATROVENT HFA) 17 MCG/ACT inhaler Inhale 1 puff into the lungs 3 (three) times daily. 09/03/20 09/03/21  Regalado, Jerald Kief A, MD  levothyroxine (SYNTHROID) 100 MCG tablet TAKE 1 TABLET BY MOUTH EVERY DAY Patient taking differently: Take 100 mcg by mouth daily before breakfast. 05/28/20   Hoyt Koch, MD  LORazepam (ATIVAN) 0.5 MG tablet Take 0.5 mg by mouth every 4 (four) hours as needed for anxiety.    [provider]  metoprolol succinate (TOPROL-XL) 50 MG 24 hr tablet TAKE 1 TABLET BY MOUTH DAILY WITH OR IMMEDIATELY FOLLOWING A MEAL. 10/15/20   Hoyt Koch, MD  nystatin ointment (MYCOSTATIN) Apply 1 application topically 2 (two) times daily. Patient taking differently: Apply 1 application topically See admin instructions. Apply to affected areas 2 times a day 03/02/20   Hoyt Koch, MD  oxyCODONE (OXY IR/ROXICODONE) 5 MG immediate release tablet Take 5-10 mg by mouth every 4 (four) hours as needed (for pain).    [provider]  OXYGEN Inhale 2 L/min into the lungs continuous.    [provider]  potassium chloride SA (KLOR-CON M20) 20 MEQ tablet Take 1 tablet (20 mEq total) by mouth daily. Please call our office to schedule a follow up to receive additional refills. 12/21/20   Hoyt Koch, MD  promethazine (PHENERGAN) 25 MG tablet Take 25 mg by mouth every 8 (eight) hours as needed for nausea or vomiting.    [provider]  ipratropium (ATROVENT) 0.02 % nebulizer solution Take 5 mLs (1 mg total) by nebulization every 6 (six) hours as needed for wheezing or shortness of breath. Patient not taking: Reported on 09/02/2020 08/04/18 09/02/20  Georgette Shell, MD  irbesartan (AVAPRO) 150 MG tablet TAKE 1/2 TABLET BY  MOUTH EVERY DAY Patient not taking: Reported on 09/02/2020 03/13/20 09/02/20  Hoyt Koch, MD    Allergies    Eliquis [apixaban], Oxycodone, and Codeine  Review of Systems   Review of Systems All other systems are reviewed and are negative for acute change except as noted in the HPI.  Physical Exam Updated Vital Signs  BP 137/65 (BP Location: Left Arm)   Pulse 91   Temp 98.5 F (36.9 C)   Resp 20   LMP  (LMP Unknown)   SpO2 99%   Physical Exam Vitals and nursing note reviewed.  Constitutional:      General: She is not in acute distress.    Appearance: She is not ill-appearing.  HENT:     Head: Normocephalic and atraumatic.     Right Ear: Tympanic membrane and external ear normal.     Left Ear: Tympanic membrane and external ear normal.     Nose: Nose normal.     Mouth/Throat:     Mouth: Mucous membranes are moist.     Pharynx: Oropharynx is clear.  Eyes:     General: No scleral icterus.       Right eye: No discharge.        Left eye: No discharge.     Extraocular Movements: Extraocular movements intact.     Conjunctiva/sclera: Conjunctivae normal.     Pupils: Pupils are equal, round, and reactive to light.  Neck:     Vascular: No JVD.  Cardiovascular:     Rate and Rhythm: Normal rate and regular rhythm.     Pulses: Normal pulses.          Radial pulses are 2+ on the right side and 2+ on the left side.     Heart sounds: Normal heart sounds.  Pulmonary:     Comments: Lungs clear to auscultation in all fields. Symmetric chest rise. No wheezing, rales, or rhonchi. On 2L [place] which is baseline per patient. Abdominal:     Tenderness: There is no right CVA tenderness or left CVA tenderness.     Comments: Abdomen is soft, non-distended, and non-tender in all quadrants. No rigidity, no guarding. No peritoneal signs.  Musculoskeletal:        General: Normal range of motion.     Cervical back: Normal range of motion.  Skin:    General: Skin is warm and dry.      Capillary Refill: Capillary refill takes less than 2 seconds.  Neurological:     Mental Status: She is oriented to person, place, and time.     GCS: GCS eye subscore is 4. GCS verbal subscore is 5. GCS motor subscore is 6.     Comments: Fluent speech, no facial droop.  Psychiatric:        Behavior: Behavior normal.     ED Results / Procedures / Treatments   Labs (all labs ordered are listed, but only abnormal results are displayed) Labs Reviewed  URINALYSIS, ROUTINE W REFLEX MICROSCOPIC - Abnormal; Notable for the following components:      Result Value   pH 8.5 (*)    Glucose, UA 100 (*)    Hgb urine dipstick LARGE (*)    Bilirubin Urine LARGE (*)    Ketones, ur 15 (*)    Protein, ur >300 (*)    Nitrite POSITIVE (*)    Leukocytes,Ua MODERATE (*)    All other components within normal limits  CBC WITH DIFFERENTIAL/PLATELET - Abnormal; Notable for the following components:   RBC 3.04 (*)    Hemoglobin 8.3 (*)    HCT 27.3 (*)    RDW 16.6 (*)    All other components within normal limits  BASIC METABOLIC PANEL - Abnormal; Notable for the following components:   Glucose, Bld 113 (*)    Creatinine, Ser 1.17 (*)    GFR,  Estimated 45 (*)    All other components within normal limits  URINALYSIS, MICROSCOPIC (REFLEX) - Abnormal; Notable for the following components:   Bacteria, UA FEW (*)    All other components within normal limits  URINE CULTURE  TYPE AND SCREEN    EKG None  Radiology CT Renal Stone Study  Result Date: 02/03/2021 CLINICAL DATA:  Hematuria, unknown cause. History of bladder cancer. EXAM: CT ABDOMEN AND PELVIS WITHOUT CONTRAST TECHNIQUE: Multidetector CT imaging of the abdomen and pelvis was performed following the standard protocol without IV contrast. COMPARISON:  CT abdomen dated 06/07/2020. FINDINGS: Lower chest: No acute abnormality. Hepatobiliary: Diffuse bilateral hepatic masses, significantly increased in size compared to the previous study. Status post  cholecystectomy. Pancreas: No acute findings. Nearly completely infiltrated with fat. Spleen: Normal in size without focal abnormality. Adrenals/Urinary Tract: Adrenal glands appear normal. Solid-appearing mass centered within the RIGHT extrarenal pelvis, extending to the upper pole moiety, difficult to measure due to its lobular configuration, the central component measuring 3.7 cm (axial series 3, image 46), overall measuring approximately 4.2 cm greatest dimension including when including its extension to the upper pole moiety (coronal series 6, image 60). This is highly suggestive of renal or urothelial neoplasm. There is associated mild RIGHT-sided hydronephrosis. There is an additional 4.5 cm cyst exophytic to the posterior cortex of the RIGHT kidney. LEFT kidney is unremarkable.  No ureteral stone. There is a mass within the bladder, originating from the nondependent wall, measuring 5 x 3.8 cm, increased in size compared to the earlier CT of 06/07/2020. Additional hyperdense material within the dependent portion of bladder, most likely blood products. Stomach/Bowel: No dilated large or small bowel loops. No evidence of bowel wall inflammation. Stomach is unremarkable, partially decompressed. Diverticulosis of the sigmoid colon but no focal inflammatory change to suggest acute diverticulitis. Vascular/Lymphatic: Aortic atherosclerosis. No enlarged lymph nodes are seen within the abdomen or pelvis. Reproductive: Presumed hysterectomy.  No adnexal mass or free fluid. Other: No free fluid or abscess collection. No free intraperitoneal air. Musculoskeletal: No acute or suspicious osseous abnormality. IMPRESSION: 1. Solid-appearing mass centered within the RIGHT extrarenal pelvis, extending to the upper pole moiety of the RIGHT kidney, overall measuring approximately 4.2 cm greatest dimension, highly suggestive of renal or urothelial neoplasm. This mass is not significantly changed in size compared to the earlier  CT of 06/07/2020. Again noted is associated mild RIGHT-sided hydronephrosis. 2. Again seen is a mass within the bladder, originating from the nondependent wall, increased in size compared to the earlier CT of 06/07/2020, now measuring 5 x 3.8 cm. This is highly suggestive of a bladder cancer. Additional hyperdense material within the dependent portion of the bladder is likely related blood products, versus now multifocal neoplastic masses. 3. Diffuse bilateral hepatic masses, significantly increased in size compared to the earlier CT of 06/07/2020, consistent with progressive liver metastases. 4. Colonic diverticulosis without evidence of acute diverticulitis. Aortic Atherosclerosis (ICD10-I70.0). Electronically Signed   By: Franki Cabot M.D.   On: 02/03/2021 23:04    Procedures Procedures   Medications Ordered in ED Medications  cefTRIAXone (ROCEPHIN) 1 g in sodium chloride 0.9 % 100 mL IVPB (1 g Intravenous New Bag/Given 02/03/21 2342)    ED Course  I have reviewed the triage vital signs and the nursing notes.  Pertinent labs & imaging results that were available during my care of the patient were reviewed by me and considered in my medical decision making (see chart for details).    MDM  Rules/Calculators/A&P                           History provided by patient and granddaughter with additional history obtained from chart review.    Patient presents with gross hematuria and dysuria x3 days.  Patient is afebrile, hemodynamically stable.  On exam patient is resting comfortably on stretcher.  She has no abdominal tenderness, no CVA tenderness.  She is on her baseline 2 L nasal cannula. CBC without leukocytosis, hemoglobin 8.3 is slightly improved from her baseline.  Normal platelets.    BMP shows creatinine consistent with baseline. Urine is concerning for infection with positive nitrates, moderate leukocytes and 6-10 WBC with few bacteria.  Urine also is notable for large hemoglobinuria, over  50 WBC, over 300 protein.  Urine culture sent.  IV Rocephin ordered. No signs of urosepsis based on labs and vitals. CT renal shows likely renal or urethral neoplasm.  Radiologist comments on a solid-appearing mass centered within the right extrarenal pelvis extending to the right pole of the right kidney that is similar in size when compared to CT scan in October 2021.  Patient has right sided hydronephrosis as mild.  Patient also has a mass in her bladder that looks to be bigger in size compared to previous scan which radiologist comments on being suggestive of bladder cancer. Additional hyperdense material within the dependent portion of the bladder is likely related blood products, versus now multifocal neoplastic masses.  Patient also with diffuse bilateral hepatic masses to suggest liver mets.  Updated patient and granddaughter on results. Patient's dysuria and feeling of retention likely related to UTI. As patient is able to pass urine she is stable to be discharged home for follow up with hospice/pcp/urology. Will discharge with prescription for cipro. Strict return precautions discussed. Findings and plan of care discussed with supervising physician Dr. Regenia Skeeter who agrees with plan of care.    Portions of this note were generated with [employer]. Dictation errors may occur despite best attempts at proofreading.   Final Clinical Impression(s) / ED Diagnoses Final diagnoses:  Acute cystitis with hematuria    Rx / DC Orders ED Discharge Orders         Ordered    ciprofloxacin (CIPRO) 500 MG tablet  Every 12 hours        02/04/21 0009           Barrie Folk, PA-C 02/04/21 0014    Sherwood Gambler, MD 02/05/21 1819

## 2021-02-03 NOTE — ED Notes (Signed)
Post-void bladder scan: 11ml.

## 2021-02-04 ENCOUNTER — Emergency Department (HOSPITAL_COMMUNITY)
Admission: EM | Admit: 2021-02-04 | Discharge: 2021-02-04 | Disposition: A | Discharge status: Home / Self Care | Payer: Medicare Other | Source: Home / Self Care | Attending: Emergency Medicine | Admitting: Emergency Medicine

## 2021-02-04 ENCOUNTER — Encounter (HOSPITAL_COMMUNITY): Payer: Self-pay | Admitting: *Deleted

## 2021-02-04 DIAGNOSIS — Z955 Presence of coronary angioplasty implant and graft: Secondary | ICD-10-CM | POA: Insufficient documentation

## 2021-02-04 DIAGNOSIS — B9689 Other specified bacterial agents as the cause of diseases classified elsewhere: Secondary | ICD-10-CM | POA: Insufficient documentation

## 2021-02-04 DIAGNOSIS — Z8551 Personal history of malignant neoplasm of bladder: Secondary | ICD-10-CM | POA: Insufficient documentation

## 2021-02-04 DIAGNOSIS — I509 Heart failure, unspecified: Secondary | ICD-10-CM | POA: Insufficient documentation

## 2021-02-04 DIAGNOSIS — E119 Type 2 diabetes mellitus without complications: Secondary | ICD-10-CM | POA: Insufficient documentation

## 2021-02-04 DIAGNOSIS — Z743 Need for continuous supervision: Secondary | ICD-10-CM | POA: Diagnosis not present

## 2021-02-04 DIAGNOSIS — I110 Hypertensive heart disease with heart failure: Secondary | ICD-10-CM | POA: Insufficient documentation

## 2021-02-04 DIAGNOSIS — G309 Alzheimer's disease, unspecified: Secondary | ICD-10-CM | POA: Insufficient documentation

## 2021-02-04 DIAGNOSIS — E039 Hypothyroidism, unspecified: Secondary | ICD-10-CM | POA: Insufficient documentation

## 2021-02-04 DIAGNOSIS — N3001 Acute cystitis with hematuria: Secondary | ICD-10-CM

## 2021-02-04 DIAGNOSIS — Z7951 Long term (current) use of inhaled steroids: Secondary | ICD-10-CM | POA: Insufficient documentation

## 2021-02-04 DIAGNOSIS — R319 Hematuria, unspecified: Secondary | ICD-10-CM | POA: Diagnosis not present

## 2021-02-04 DIAGNOSIS — J45909 Unspecified asthma, uncomplicated: Secondary | ICD-10-CM | POA: Insufficient documentation

## 2021-02-04 DIAGNOSIS — R6889 Other general symptoms and signs: Secondary | ICD-10-CM | POA: Diagnosis not present

## 2021-02-04 DIAGNOSIS — J441 Chronic obstructive pulmonary disease with (acute) exacerbation: Secondary | ICD-10-CM | POA: Insufficient documentation

## 2021-02-04 DIAGNOSIS — Z79899 Other long term (current) drug therapy: Secondary | ICD-10-CM | POA: Insufficient documentation

## 2021-02-04 HISTORY — DX: Dementia in other diseases classified elsewhere, unspecified severity, without behavioral disturbance, psychotic disturbance, mood disturbance, and anxiety: F02.80

## 2021-02-04 LAB — URINALYSIS, ROUTINE W REFLEX MICROSCOPIC
Bilirubin Urine: NEGATIVE
Glucose, UA: NEGATIVE mg/dL
Ketones, ur: NEGATIVE mg/dL
Leukocytes,Ua: NEGATIVE
Nitrite: NEGATIVE
Protein, ur: 100 mg/dL — AB
RBC / HPF: >50 RBC/hpf — ABNORMAL HIGH (ref 0–5)
Specific Gravity, Urine: 1.009 (ref 1.005–1.030)
pH: 9 — ABNORMAL HIGH (ref 5.0–8.0)

## 2021-02-04 LAB — CBC WITH DIFFERENTIAL/PLATELET
Abs Immature Granulocytes: 0.06 10*3/uL (ref 0.00–0.07)
Basophils Absolute: 0.1 10*3/uL (ref 0.0–0.1)
Basophils Relative: 1 %
Eosinophils Absolute: 0.2 10*3/uL (ref 0.0–0.5)
Eosinophils Relative: 2 %
HCT: 28.9 % — ABNORMAL LOW (ref 36.0–46.0)
Hemoglobin: 8.6 g/dL — ABNORMAL LOW (ref 12.0–15.0)
Immature Granulocytes: 1 %
Lymphocytes Relative: 14 %
Lymphs Abs: 1.4 10*3/uL (ref 0.7–4.0)
MCH: 26.9 pg (ref 26.0–34.0)
MCHC: 29.8 g/dL — ABNORMAL LOW (ref 30.0–36.0)
MCV: 90.3 fL (ref 80.0–100.0)
Monocytes Absolute: 0.7 10*3/uL (ref 0.1–1.0)
Monocytes Relative: 6 %
Neutro Abs: 7.9 10*3/uL — ABNORMAL HIGH (ref 1.7–7.7)
Neutrophils Relative %: 76 %
Platelets: 392 10*3/uL (ref 150–400)
RBC: 3.2 MIL/uL — ABNORMAL LOW (ref 3.87–5.11)
RDW: 16.8 % — ABNORMAL HIGH (ref 11.5–15.5)
WBC: 10.3 10*3/uL (ref 4.0–10.5)
nRBC: 0 % (ref 0.0–0.2)

## 2021-02-04 LAB — BASIC METABOLIC PANEL
Anion gap: 7 (ref 5–15)
BUN: 22 mg/dL (ref 8–23)
CO2: 25 mmol/L (ref 22–32)
Calcium: 8.9 mg/dL (ref 8.9–10.3)
Chloride: 104 mmol/L (ref 98–111)
Creatinine, Ser: 1.25 mg/dL — ABNORMAL HIGH (ref 0.44–1.00)
GFR, Estimated: 42 mL/min — ABNORMAL LOW (ref >60)
Glucose, Bld: 99 mg/dL (ref 70–99)
Potassium: 4.5 mmol/L (ref 3.5–5.1)
Sodium: 136 mmol/L (ref 135–145)

## 2021-02-04 MED ORDER — PHENAZOPYRIDINE HCL 200 MG PO TABS: 200.0000 mg | ORAL_TABLET | Freq: Three times a day (TID) | ORAL | 0 refills | Status: DC

## 2021-02-04 MED ORDER — ONDANSETRON 4 MG PO TBDP: 4.0000 mg | ORAL_TABLET | Freq: Once | ORAL | Status: DC

## 2021-02-04 MED ORDER — CIPROFLOXACIN HCL 500 MG PO TABS: 500.0000 mg | ORAL_TABLET | Freq: Two times a day (BID) | ORAL | 0 refills | Status: AC

## 2021-02-04 MED ORDER — PHENAZOPYRIDINE HCL 100 MG PO TABS
200.0000 mg | ORAL_TABLET | Freq: Once | ORAL | Status: AC
  Administered 2021-02-04: 200 mg via ORAL
  Filled 2021-02-04: qty 2

## 2021-02-04 MED ORDER — CIPROFLOXACIN HCL 500 MG PO TABS
500.0000 mg | ORAL_TABLET | Freq: Once | ORAL | Status: AC
  Administered 2021-02-04: 500 mg via ORAL
  Filled 2021-02-04: qty 1

## 2021-02-04 MED ORDER — DILTIAZEM HCL ER COATED BEADS 180 MG PO CP24
180.0000 mg | ORAL_CAPSULE | Freq: Once | ORAL | Status: DC
  Filled 2021-02-04: qty 1

## 2021-02-04 MED ORDER — METOPROLOL SUCCINATE ER 25 MG PO TB24: 50.0000 mg | ORAL_TABLET | Freq: Every day | ORAL | Status: DC

## 2021-02-04 NOTE — ED Provider Notes (Addendum)
[facility] EMERGENCY DEPARTMENT Provider Note   CSN: [id number] Arrival date & time: 02/04/21  1524     History Chief Complaint  Patient presents with  . Urinary Tract Infection    [name] Case is a 87 y.o. female with history of permanent atrial fibrillation, hypertension, hyperlipidemia, chronic diastolic heart failure, COPD, metastatic bladder cancer, under hospice care returns to the emergency department for evaluation of continued blood in her urine.  She states that she was in the emergency department yesterday and she was discharged but has continued to have blood in her urine through the night.  She denies any nausea, vomiting, abdominal or back or flank pain.  Denies any dysuria.  She is asking if she can have something to eat.  Denies subjective fevers, chills.  She has no other concerns.  I called patient's granddaughter Beth.  She states that patient continued of being unable to urinate all through the night.  She confirms that patient was seen in the emergency department yesterday and was diagnosed with a UTI.  They were told that her bladder cancer had gotten slightly bigger and that had spread to other areas of her body including her liver.  She has had 1 dose of ciprofloxacin at home thus far.  Granddaughter denies fevers, nausea, vomiting.  She is asking if we can give her something for discomfort with urination.  She confirms patient is DNR under hospice care and under comfort measures only.  They have a urologist who is currently managing her bladder cancer.  HPI     Past Medical History:  Diagnosis Date  . Allergy   . Alzheimer's dementia ([place])   . Arthritis   . Atrial fibrillation ([place])   . Bronchitis   . Cancer ([place])    Bilateral upper tract transitional cell carcinoma  . Cardiomegaly   . CHF (congestive heart failure) ([place])   . Complication of anesthesia    Difficult to arouse  . COPD (chronic obstructive pulmonary disease) ([facility])     borderline   pt. denies at preop  . Diabetes mellitus without complication ([place])    Controlled by diet and exercise  . Diverticulosis of sigmoid colon   . Family history of bladder cancer   . Family history of colon cancer   . Family history of kidney cancer   . Fatty liver   . GERD (gastroesophageal reflux disease)   . History of hiatal hernia 01/20/2015   Small sliding, noted on CT  . History of kidney stones   . Hyperlipidemia   . Hypertension   . Hypothyroidism   . Lynch syndrome   . PONV (postoperative nausea and vomiting)   . Supraumbilical hernia 88/41/6606   Small noted on CT    Patient Active Problem List   Diagnosis Date Noted  . Acute viral syndrome 09/02/2020  . Symptomatic anemia 09/02/2020  . Gross hematuria 03/02/2020  . Candidal skin infection 03/02/2020  . Degenerative arthritis of right knee 11/09/2019  . COPD with acute exacerbation ([place]) 07/30/2018  . Lynch syndrome 05/05/2018  . Genetic testing 04/28/2018  . Adjustment disorder 04/23/2018  . Urothelial cancer ([place]) 04/12/2018  . Lichen planus 30/16/0109  . Obesity hypoventilation syndrome ([place])   . Hypokalemia   . Chronic diastolic (congestive) heart failure ([place]) 01/09/2016  . History of snoring 05/20/2015  . Chronic respiratory failure with hypoxia ([place])   . Type 2 diabetes mellitus without complication ([place])   . Atrial fibrillation ([place]) 01/06/2015  .  Hypothyroidism 03/09/2007  . Hyperlipidemia 03/09/2007  . Morbid obesity ([place]) 03/09/2007  . Essential hypertension 03/09/2007  . ALLERGIC RHINITIS 03/09/2007  . Asthma 03/09/2007  . GERD 03/09/2007  . INCONTINENCE 03/09/2007    Past Surgical History:  Procedure Laterality Date  . ABDOMINAL HYSTERECTOMY    . APPENDECTOMY    . CHOLECYSTECTOMY    . COLONOSCOPY  07/16/2009  . CYSTOSCOPY W/ URETERAL STENT PLACEMENT Right 12/04/2017   Procedure: CYSTOSCOPY WITH RETROGRADE PYELOGRAM/URETERAL STENT PLACEMENT;  Surgeon: Festus Aloe, MD;   Location: [facility];  Service: Urology;  Laterality: Right;  . CYSTOSCOPY W/ URETERAL STENT PLACEMENT Right 02/26/2018   Procedure: CYSTOSCOPY WITH RETROGRADE PYELOGRAM/URETERAL STENT PLACEMENT;  Surgeon: Kathie Rhodes, MD;  Location: [facility];  Service: Urology;  Laterality: Right;  . CYSTOSCOPY WITH URETEROSCOPY AND STENT PLACEMENT Bilateral 01/29/2018   Procedure: BILATERAL URETEROSCOPY AND TUMOR RESECTION  WITH LASER RIGHT STENT EXCHANGE LEFT STENT PLACEMENT;  Surgeon: Ardis Hughs, MD;  Location: [facility];  Service: Urology;  Laterality: Bilateral;  . CYSTOSCOPY WITH URETEROSCOPY AND STENT PLACEMENT Bilateral 02/10/2018   Procedure: BILATERAL URETEROSCOPY AND TUMOR EXCISION BILATERAL STENT EXCHANGE;  Surgeon: Ardis Hughs, MD;  Location: [facility];  Service: Urology;  Laterality: Bilateral;  . CYSTOSCOPY/RETROGRADE/URETEROSCOPY Bilateral 12/24/2017   Procedure: BILATERAL URETEROSCOPY, RIGHT STENT EXCHANGE, STONE EXTRACTION WITH BASKET BILATERAL RETROGRADE PYELOGRAM, BIOPSY OF RIGHT UPPER POLE TUMOR,  BILATERAL RENAL PELVIS FLUID SENT FOR CYTOLOGY;  Surgeon: Ardis Hughs, MD;  Location: [facility];  Service: Urology;  Laterality: Bilateral;  . CYSTOSCOPY/URETEROSCOPY/HOLMIUM LASER/STENT PLACEMENT Bilateral 06/03/2018   Procedure: CYSTOSCOPY/BILATERAL URETEROSCOPY/ LASER TUMOR ABLATION BILATERAL / BILATERAL STENT PLACEMENT/BILATERAL RETROGRADE PYELOGRAM/BLADDER BIOPSY WITH FULGURATION;  Surgeon: Ardis Hughs, MD;  Location: [facility];  Service: Urology;  Laterality: Bilateral;  . HOLMIUM LASER APPLICATION Bilateral 17/01/1606   Procedure: HOLMIUM LASER APPLICATION;  Surgeon: Ardis Hughs, MD;  Location: [facility];  Service: Urology;  Laterality: Bilateral;  . KNEE SURGERY Right   . KNEE SURGERY Left    torn ligament repair  . UPPER GI ENDOSCOPY    . ureteroscopy laser tumor ablation bilateral stent placement     Dr. Louis Meckel 06-03-18      OB History   No obstetric history on file.      Family History  Problem Relation Age of Onset  . Heart disease Mother   . Colon cancer Mother 34       d. 15  . Tuberculosis Father   . Bladder Cancer Brother        urothelial cancer  . Bladder Cancer Brother        ureter cancer  . Kidney cancer Brother   . Skin cancer Sister   . Cancer Maternal Aunt        NOS  . Colon cancer Other        dx 1st time under 75, second time at 11  . Lung cancer Other   . Muscular dystrophy Other        d. 45  . Testicular cancer Other 24       great nephew  . Alcohol abuse Daughter   . Drug abuse Daughter   . Schizophrenia Daughter   . Colon cancer Grandchild     Social History   Tobacco Use  . Smoking status: Never Smoker  . Smokeless tobacco: Never Used  Vaping Use  . Vaping Use: Never used  Substance Use Topics  . Alcohol use: No    Alcohol/week: 0.0 standard  drinks  . Drug use: No    Home Medications Prior to Admission medications   Medication Sig Start Date End Date Taking? Authorizing Provider  acetaminophen (TYLENOL) 500 MG tablet Take 1,000 mg by mouth every 4 (four) hours as needed for moderate pain (in conjunection with Oxycodone IR 5 mg tablets).    [provider]  albuterol (PROVENTIL) (2.5 MG/3ML) 0.083% nebulizer solution Take 3 mLs (2.5 mg total) by nebulization every 6 (six) hours as needed for wheezing or shortness of breath. 01/22/21   Blanchie Dessert, MD  AMBULATORY NON FORMULARY MEDICATION [facility] 11/09/19   Lyndal Pulley, DO  atorvastatin (LIPITOR) 20 MG tablet TAKE 1 TABLET BY MOUTH DAILY AT 6 PM 01/25/21   Hoyt Koch, MD  benzonatate (TESSALON) 200 MG capsule Take 1 capsule (200 mg total) by mouth 2 (two) times daily as needed for cough. 01/22/21   Blanchie Dessert, MD  calcium carbonate (TUMS EX) 750 MG chewable tablet Chew 1 tablet by mouth as needed for heartburn.    [provider]  ciprofloxacin (CIPRO) 500 MG tablet Take 1 tablet (500 mg total) by mouth every 12  (twelve) hours for 7 days. 02/04/21 02/11/21  Barrie Folk, PA-C  dextromethorphan-guaiFENesin (MUCINEX DM) 30-600 MG 12hr tablet Take 1 tablet by mouth 2 (two) times daily. 09/03/20   Regalado, Belkys A, MD  diltiazem (CARDIZEM CD) 180 MG 24 hr capsule TAKE 1 CAPSULE BY MOUTH EVERY DAY 10/15/20   Hoyt Koch, MD  furosemide (LASIX) 40 MG tablet TAKE 2 TABLETS BY MOUTH EVERY DAY 01/09/21   Hoyt Koch, MD  ipratropium (ATROVENT HFA) 17 MCG/ACT inhaler Inhale 1 puff into the lungs 3 (three) times daily. 09/03/20 09/03/21  Regalado, Jerald Kief A, MD  levothyroxine (SYNTHROID) 100 MCG tablet TAKE 1 TABLET BY MOUTH EVERY DAY Patient taking differently: Take 100 mcg by mouth daily before breakfast. 05/28/20   Hoyt Koch, MD  LORazepam (ATIVAN) 0.5 MG tablet Take 0.5 mg by mouth every 4 (four) hours as needed for anxiety.    [provider]  metoprolol succinate (TOPROL-XL) 50 MG 24 hr tablet TAKE 1 TABLET BY MOUTH DAILY WITH OR IMMEDIATELY FOLLOWING A MEAL. 10/15/20   Hoyt Koch, MD  nystatin ointment (MYCOSTATIN) Apply 1 application topically 2 (two) times daily. Patient taking differently: Apply 1 application topically See admin instructions. Apply to affected areas 2 times a day 03/02/20   Hoyt Koch, MD  oxyCODONE (OXY IR/ROXICODONE) 5 MG immediate release tablet Take 5-10 mg by mouth every 4 (four) hours as needed (for pain).    [provider]  OXYGEN Inhale 2 L/min into the lungs continuous.    [provider]  potassium chloride SA (KLOR-CON M20) 20 MEQ tablet Take 1 tablet (20 mEq total) by mouth daily. Please call our office to schedule a follow up to receive additional refills. 12/21/20   Hoyt Koch, MD  promethazine (PHENERGAN) 25 MG tablet Take 25 mg by mouth every 8 (eight) hours as needed for nausea or vomiting.    [provider]  ipratropium (ATROVENT) 0.02 % nebulizer solution Take 5 mLs (1 mg total)  by nebulization every 6 (six) hours as needed for wheezing or shortness of breath. Patient not taking: Reported on 09/02/2020 08/04/18 09/02/20  Georgette Shell, MD  irbesartan (AVAPRO) 150 MG tablet TAKE 1/2 TABLET BY MOUTH EVERY DAY Patient not taking: Reported on 09/02/2020 03/13/20 09/02/20  Hoyt Koch, MD  Allergies    Eliquis [apixaban], Oxycodone, and Codeine  Review of Systems   Review of Systems  Genitourinary: Positive for difficulty urinating and hematuria.  All other systems reviewed and are negative.   Physical Exam Updated Vital Signs BP 137/88   Pulse (!) 122   Temp 97.6 F (36.4 C) (Oral)   Resp 15   LMP  (LMP Unknown)   SpO2 97%   Physical Exam Vitals and nursing note reviewed.  Constitutional:      General: She is not in acute distress.    Appearance: She is well-developed.     Comments: NAD.  HENT:     Head: Normocephalic and atraumatic.     Right Ear: External ear normal.     Left Ear: External ear normal.     Nose: Nose normal.  Eyes:     General: No scleral icterus.    Conjunctiva/sclera: Conjunctivae normal.  Cardiovascular:     Rate and Rhythm: Normal rate and regular rhythm.     Heart sounds: Normal heart sounds. No murmur heard.   Pulmonary:     Effort: Pulmonary effort is normal.     Breath sounds: Normal breath sounds. No wheezing.  Abdominal:     Palpations: Abdomen is soft.     Tenderness: There is no abdominal tenderness.     Comments: Abdomen soft.  No suprapubic or CVA tenderness.  Musculoskeletal:        General: No deformity. Normal range of motion.     Cervical back: Normal range of motion and neck supple.  Skin:    General: Skin is warm and dry.     Capillary Refill: Capillary refill takes less than 2 seconds.  Neurological:     Mental Status: She is alert and oriented to person, place, and time.  Psychiatric:        Behavior: Behavior normal.        Thought Content: Thought content normal.        Judgment:  Judgment normal.     ED Results / Procedures / Treatments   Labs (all labs ordered are listed, but only abnormal results are displayed) Labs Reviewed  BASIC METABOLIC PANEL - Abnormal; Notable for the following components:      Result Value   Creatinine, Ser 1.25 (*)    GFR, Estimated 42 (*)    All other components within normal limits  CBC WITH DIFFERENTIAL/PLATELET - Abnormal; Notable for the following components:   RBC 3.20 (*)    Hemoglobin 8.6 (*)    HCT 28.9 (*)    MCHC 29.8 (*)    RDW 16.8 (*)    Neutro Abs 7.9 (*)    All other components within normal limits  URINALYSIS, ROUTINE W REFLEX MICROSCOPIC    EKG None  Radiology CT Renal Stone Study  Result Date: 02/03/2021 CLINICAL DATA:  Hematuria, unknown cause. History of bladder cancer. EXAM: CT ABDOMEN AND PELVIS WITHOUT CONTRAST TECHNIQUE: Multidetector CT imaging of the abdomen and pelvis was performed following the standard protocol without IV contrast. COMPARISON:  CT abdomen dated 06/07/2020. FINDINGS: Lower chest: No acute abnormality. Hepatobiliary: Diffuse bilateral hepatic masses, significantly increased in size compared to the previous study. Status post cholecystectomy. Pancreas: No acute findings. Nearly completely infiltrated with fat. Spleen: Normal in size without focal abnormality. Adrenals/Urinary Tract: Adrenal glands appear normal. Solid-appearing mass centered within the RIGHT extrarenal pelvis, extending to the upper pole moiety, difficult to measure due to its lobular configuration, the central component measuring  3.7 cm (axial series 3, image 46), overall measuring approximately 4.2 cm greatest dimension including when including its extension to the upper pole moiety (coronal series 6, image 60). This is highly suggestive of renal or urothelial neoplasm. There is associated mild RIGHT-sided hydronephrosis. There is an additional 4.5 cm cyst exophytic to the posterior cortex of the RIGHT kidney. LEFT kidney  is unremarkable.  No ureteral stone. There is a mass within the bladder, originating from the nondependent wall, measuring 5 x 3.8 cm, increased in size compared to the earlier CT of 06/07/2020. Additional hyperdense material within the dependent portion of bladder, most likely blood products. Stomach/Bowel: No dilated large or small bowel loops. No evidence of bowel wall inflammation. Stomach is unremarkable, partially decompressed. Diverticulosis of the sigmoid colon but no focal inflammatory change to suggest acute diverticulitis. Vascular/Lymphatic: Aortic atherosclerosis. No enlarged lymph nodes are seen within the abdomen or pelvis. Reproductive: Presumed hysterectomy.  No adnexal mass or free fluid. Other: No free fluid or abscess collection. No free intraperitoneal air. Musculoskeletal: No acute or suspicious osseous abnormality. IMPRESSION: 1. Solid-appearing mass centered within the RIGHT extrarenal pelvis, extending to the upper pole moiety of the RIGHT kidney, overall measuring approximately 4.2 cm greatest dimension, highly suggestive of renal or urothelial neoplasm. This mass is not significantly changed in size compared to the earlier CT of 06/07/2020. Again noted is associated mild RIGHT-sided hydronephrosis. 2. Again seen is a mass within the bladder, originating from the nondependent wall, increased in size compared to the earlier CT of 06/07/2020, now measuring 5 x 3.8 cm. This is highly suggestive of a bladder cancer. Additional hyperdense material within the dependent portion of the bladder is likely related blood products, versus now multifocal neoplastic masses. 3. Diffuse bilateral hepatic masses, significantly increased in size compared to the earlier CT of 06/07/2020, consistent with progressive liver metastases. 4. Colonic diverticulosis without evidence of acute diverticulitis. Aortic Atherosclerosis (ICD10-I70.0). Electronically Signed   By: Franki Cabot M.D.   On: 02/03/2021 23:04     Procedures Procedures   Medications Ordered in ED Medications  ondansetron (ZOFRAN-ODT) disintegrating tablet 4 mg (4 mg Oral Not Given 02/04/21 2217)  ciprofloxacin (CIPRO) tablet 500 mg (has no administration in time range)  diltiazem (CARDIZEM CD) 24 hr capsule 180 mg (has no administration in time range)  metoprolol succinate (TOPROL-XL) 24 hr tablet 50 mg (has no administration in time range)  phenazopyridine (PYRIDIUM) tablet 200 mg (200 mg Oral Given 02/04/21 2207)    ED Course  I have reviewed the triage vital signs and the nursing notes.  Pertinent labs & imaging results that were available during my care of the patient were reviewed by me and considered in my medical decision making (see chart for details).    MDM Rules/Calculators/A&P                           87 y.o. yo female presents to the ED for hematuria, painless.  Seen in the ED yesterday and diagnosed with a UTI.  She was given 1 g Rocephin prior to discharge and discharged with ciprofloxacin.  Additional information obtained from chart, nursing and triage notes review  Chart review reveals -UA yesterday shows moderate leukocytes, positive nitrites, few bacteria.  CT yesterday showed mass in the right extrarenal pelvis highly suggestive of renal or urethral neoplasm stable from previous, as well as a bladder mass consistent with known bladder cancer, bilateral hepatic masses consistent with metastatic  disease that has progressed from previous.  Ordered lab, imaging were personally reviewed and interpreted.  Lab work was ordered by [employer] provider.  Labs reveal -labs overall today are reassuring.  No leukocytosis.  Stable hemoglobin.  Normal creatinine.  Urine today is grossly bloody, will recollect.  Imaging reveals - none  Medicines ordered -Pyridium, ciprofloxacin.  ED course & MDM 2035: I spoke with patient's granddaughter and POA.  They confirmed that patient has had ongoing hematuria for a long time.  They  confirmed that they were told yesterday that her cancer had progressed slightly.  They confirm she is DNR and under comfort measures only.  They deny red flags like fever, vomiting, mental status changes.  They were mostly concerned that patient was uncomfortable through the night and complaining of being unable to urinate.  Patient has been reevaluated and has no other concerns.  Denies pain.  Has urinated in the ED without any discomfort.  Tolerating p.o.  Ambulatory with some assistance.  Heart rate is in the 110s-120s in setting of permanent atrial fibrillation.  I confirmed with granddaughter that patient did not have her scheduled Cardizem or metoprolol today so far, will give her her home dose.  She has no reported chest pain, shortness of breath, palpitations or the other cardiac symptoms.  Overall feel patient is stable for discharge.  Will prescribe Pyridium.  Will recommend close PCP follow-up in the next 48 hours.  2305: Patient's family is here to pick her up. We have not received metoprolol/cardizem to administer them prior to discharge.  HR now 114, asymptomatic. Family will give medicines when she gets home.  Discussed with EDP  Portions of this note were generated with Dragon dictation software. Dictation errors may occur despite best attempts at proofreading    Final Clinical Impression(s) / ED Diagnoses Final diagnoses:  Hematuria due to acute cystitis    Rx / DC Orders ED Discharge Orders    None       Kinnie Feil, PA-C 02/04/21 2241    Kinnie Feil, PA-C 02/04/21 2306    Valarie Merino, MD 02/07/21 [phone]

## 2021-02-04 NOTE — ED Notes (Signed)
Patient transported to X-ray 

## 2021-02-04 NOTE — ED Notes (Signed)
Great-granddaughter, with whom patient lives, called greatly concerned about her grandmother's condition, stating she was just discharged from the hospital yesterday and is back today, unable to urinate except for blood. Please call her Layla Barter [phone]

## 2021-02-04 NOTE — ED Provider Notes (Signed)
Emergency Medicine Provider Triage Evaluation Note  [name] Case , a 87 y.o. female  was evaluated in triage.  Pt complains of feeling like she cannot pee and urinary pain.  Seen for the same yesterday.  Discharged on antibiotics.  Associated nausea.  Review of Systems  Positive: Dysuria and hematuria Negative: Fever  Physical Exam  LMP  (LMP Unknown)  Gen:   Awake, no distress   Resp:  Normal effort  MSK:   Moves extremities without difficulty  Other:  Suprapubic pain, no CVA tenderness  Medical Decision Making  Medically screening exam initiated at 3:50 PM.  Appropriate orders placed.  [name] Case was informed that the remainder of the evaluation will be completed by another provider, this initial triage assessment does not replace that evaluation, and the importance of remaining in the ED until their evaluation is complete.  LABS/ WORK UP INITIATED   Margarita Mail, PA-C 02/04/21 1603    Charlesetta Shanks, MD 02/12/21 2206

## 2021-02-04 NOTE — ED Notes (Signed)
Pt discharged and wheeled to the lobby without difficulty.

## 2021-02-04 NOTE — ED Notes (Signed)
Daughter called to find out status of her mom. Joy's number is [phone].Please have mother call her daughter with an update.

## 2021-02-04 NOTE — ED Triage Notes (Signed)
Pt arrived by [facility] from home. Pt was seen yesterday for UTI and has taken 1 dose of Cipro. Called ems today for blood in urine and difficult urinating.

## 2021-02-04 NOTE — ED Notes (Signed)
Spoke with pt granddaughter per pt request to provide update. Per granddaughter pt diagnosed with Alzheimer's a few years ago by Sharlet Salina, MD pt PCP. This RN updated pt hx at this time and made provider aware.

## 2021-02-04 NOTE — Discharge Instructions (Addendum)
Please take your heart medicines including metoprolol and diltiazem soon as you get home.  Continue taking your ciprofloxacin.  Take Pyridium to help with bladder discomfort  Your symptoms should improve over the next 24 to 48 hours.  Stay hydrated.  Call your primary care doctor and make an appointment for recheck in the next 48 to 72 hours.  Return to the ED for fever greater than 100.4, severe lower abdominal or flank pain or any other new concerns

## 2021-02-04 NOTE — Discharge Instructions (Signed)
Prescription sent to the pharmacy for an antibiotic for UTI. Take this as prescribed. If can cause diarrhea so take with a probiotic, ask the pharmacist about this.  Follow up with hospice to further discuss your symptoms.

## 2021-02-07 ENCOUNTER — Emergency Department (HOSPITAL_COMMUNITY)
Admission: EM | Admit: 2021-02-07 | Discharge: 2021-02-07 | Disposition: A | Discharge status: Home / Self Care | Payer: Medicare Other | Attending: Emergency Medicine | Admitting: Emergency Medicine

## 2021-02-07 ENCOUNTER — Encounter (HOSPITAL_COMMUNITY): Payer: Self-pay | Admitting: Pharmacy Technician

## 2021-02-07 ENCOUNTER — Emergency Department (HOSPITAL_COMMUNITY): Payer: Medicare Other

## 2021-02-07 DIAGNOSIS — G309 Alzheimer's disease, unspecified: Secondary | ICD-10-CM | POA: Insufficient documentation

## 2021-02-07 DIAGNOSIS — E039 Hypothyroidism, unspecified: Secondary | ICD-10-CM | POA: Insufficient documentation

## 2021-02-07 DIAGNOSIS — Z20822 Contact with and (suspected) exposure to covid-19: Secondary | ICD-10-CM | POA: Insufficient documentation

## 2021-02-07 DIAGNOSIS — I110 Hypertensive heart disease with heart failure: Secondary | ICD-10-CM | POA: Insufficient documentation

## 2021-02-07 DIAGNOSIS — Z79899 Other long term (current) drug therapy: Secondary | ICD-10-CM | POA: Diagnosis not present

## 2021-02-07 DIAGNOSIS — I499 Cardiac arrhythmia, unspecified: Secondary | ICD-10-CM | POA: Diagnosis not present

## 2021-02-07 DIAGNOSIS — E860 Dehydration: Secondary | ICD-10-CM | POA: Insufficient documentation

## 2021-02-07 DIAGNOSIS — Z8551 Personal history of malignant neoplasm of bladder: Secondary | ICD-10-CM | POA: Insufficient documentation

## 2021-02-07 DIAGNOSIS — J45909 Unspecified asthma, uncomplicated: Secondary | ICD-10-CM | POA: Insufficient documentation

## 2021-02-07 DIAGNOSIS — J449 Chronic obstructive pulmonary disease, unspecified: Secondary | ICD-10-CM | POA: Insufficient documentation

## 2021-02-07 DIAGNOSIS — R0602 Shortness of breath: Secondary | ICD-10-CM | POA: Insufficient documentation

## 2021-02-07 DIAGNOSIS — E119 Type 2 diabetes mellitus without complications: Secondary | ICD-10-CM | POA: Diagnosis not present

## 2021-02-07 DIAGNOSIS — I5032 Chronic diastolic (congestive) heart failure: Secondary | ICD-10-CM | POA: Insufficient documentation

## 2021-02-07 DIAGNOSIS — Z7951 Long term (current) use of inhaled steroids: Secondary | ICD-10-CM | POA: Insufficient documentation

## 2021-02-07 DIAGNOSIS — Z743 Need for continuous supervision: Secondary | ICD-10-CM | POA: Diagnosis not present

## 2021-02-07 LAB — TROPONIN I (HIGH SENSITIVITY)
Troponin I (High Sensitivity): 19 ng/L — ABNORMAL HIGH (ref <18)
Troponin I (High Sensitivity): 20 ng/L — ABNORMAL HIGH (ref <18)

## 2021-02-07 LAB — CBC WITH DIFFERENTIAL/PLATELET
Abs Immature Granulocytes: 0.07 10*3/uL (ref 0.00–0.07)
Basophils Absolute: 0.1 10*3/uL (ref 0.0–0.1)
Basophils Relative: 1 %
Eosinophils Absolute: 0 10*3/uL (ref 0.0–0.5)
Eosinophils Relative: 0 %
HCT: 30.5 % — ABNORMAL LOW (ref 36.0–46.0)
Hemoglobin: 9.1 g/dL — ABNORMAL LOW (ref 12.0–15.0)
Immature Granulocytes: 1 %
Lymphocytes Relative: 17 %
Lymphs Abs: 1.6 10*3/uL (ref 0.7–4.0)
MCH: 26.9 pg (ref 26.0–34.0)
MCHC: 29.8 g/dL — ABNORMAL LOW (ref 30.0–36.0)
MCV: 90.2 fL (ref 80.0–100.0)
Monocytes Absolute: 0.7 10*3/uL (ref 0.1–1.0)
Monocytes Relative: 8 %
Neutro Abs: 6.6 10*3/uL (ref 1.7–7.7)
Neutrophils Relative %: 73 %
Platelets: 428 10*3/uL — ABNORMAL HIGH (ref 150–400)
RBC: 3.38 MIL/uL — ABNORMAL LOW (ref 3.87–5.11)
RDW: 17.2 % — ABNORMAL HIGH (ref 11.5–15.5)
WBC: 9.1 10*3/uL (ref 4.0–10.5)
nRBC: 0 % (ref 0.0–0.2)

## 2021-02-07 LAB — COMPREHENSIVE METABOLIC PANEL
ALT: 18 U/L (ref 0–44)
AST: 26 U/L (ref 15–41)
Albumin: 3.5 g/dL (ref 3.5–5.0)
Alkaline Phosphatase: 409 U/L — ABNORMAL HIGH (ref 38–126)
Anion gap: 12 (ref 5–15)
BUN: 30 mg/dL — ABNORMAL HIGH (ref 8–23)
CO2: 25 mmol/L (ref 22–32)
Calcium: 9.1 mg/dL (ref 8.9–10.3)
Chloride: 99 mmol/L (ref 98–111)
Creatinine, Ser: 1.79 mg/dL — ABNORMAL HIGH (ref 0.44–1.00)
GFR, Estimated: 27 mL/min — ABNORMAL LOW (ref >60)
Glucose, Bld: 132 mg/dL — ABNORMAL HIGH (ref 70–99)
Potassium: 4.1 mmol/L (ref 3.5–5.1)
Sodium: 136 mmol/L (ref 135–145)
Total Bilirubin: 0.6 mg/dL (ref 0.3–1.2)
Total Protein: 6.8 g/dL (ref 6.5–8.1)

## 2021-02-07 LAB — LIPASE, BLOOD: Lipase: 23 U/L (ref 11–51)

## 2021-02-07 LAB — LACTIC ACID, PLASMA
Lactic Acid, Venous: 2.8 mmol/L (ref 0.5–1.9)
Lactic Acid, Venous: 2.6 mmol/L (ref 0.5–1.9)

## 2021-02-07 LAB — URINE CULTURE: Culture: 50000 — AB

## 2021-02-07 LAB — BRAIN NATRIURETIC PEPTIDE: B Natriuretic Peptide: 186.7 pg/mL — ABNORMAL HIGH (ref 0.0–100.0)

## 2021-02-07 MED ORDER — SODIUM CHLORIDE 0.9 % IV BOLUS
1000.0000 mL | Freq: Once | INTRAVENOUS | Status: AC
  Administered 2021-02-07: 1000 mL via INTRAVENOUS

## 2021-02-07 NOTE — ED Notes (Signed)
Patient transported to X-ray 

## 2021-02-07 NOTE — ED Notes (Signed)
Patient transported to CT 

## 2021-02-07 NOTE — ED Provider Notes (Signed)
Emergency Medicine Provider Triage Evaluation Note  [name] Case , a 87 y.o. female  was evaluated in triage.  Pt complains of shortness of breath.  Has had chronic dizziness since ems arrival.  States chronic oxygen use at home. Did require NRB for ems however deescalated to 4 L [name] Case. EMS states patient with confusion hx of UTI. Patient without complaints here in ED however does appear confused  Review of Systems  Positive: SOB, confusion Negative: CP  Level 5 caveat  Physical Exam  LMP  (LMP Unknown)  Gen:   Awake, no distress   Resp:  Normal effort  MSK:   Moves extremities without difficulty  Other:    Medical Decision Making  Medically screening exam initiated at 5:14 PM.  Appropriate orders placed.  [name] Case was informed that the remainder of the evaluation will be completed by another provider, this initial triage assessment does not replace that evaluation, and the importance of remaining in the ED until their evaluation is complete.  Confusion, SOB, Hypotension  Initial BP in 00'Q systolic with MAP <67  Patient with hypotension, confusion, hypoxia increased from baseline. Sepsis labs ordered.  Nursing notified patient needs room in back   Alexandar Weisenberger A, PA-C 02/07/21 1721    Dorie Rank, MD 02/07/21 [phone]

## 2021-02-07 NOTE — ED Provider Notes (Signed)
[facility] EMERGENCY DEPARTMENT Provider Note   CSN: [id number] Arrival date & time: 02/07/21  1717     History No chief complaint on file.   [name] Case is a 87 y.o. female.  The history is provided by the patient, the EMS personnel and medical records. No language interpreter was used.   87 year old female significant history of CHF, COPD, diabetes, GERD, hypertension, Alzheimer's dementia, atrial fibrillation not on anticoagulant brought here via EMS from home for evaluation of shortness of breath.  Patient reports she is having increased shortness of breath for the past several days.  States that she is mostly in bed and she feels increased short of breath.  She also mentioned that she was recently diagnosed with a urinary tract infection but not on any antibiotic at this time.  She denies any fever or chills no runny nose sneezing coughing sore throat chest pain abdominal pain back pain or urinary discomfort.  Initially EMS mention the patient appears confused however at this time patient is able to answer all questions appropriately.  She does wear supplemental oxygen at home at 2 L.  Initially patient was placed on nonrebreather by EMS and then de-escalate to 4 L nasal cannula.  Unsure COVID's vaccination status.  Patient does request for extra blanket and states that she is cold.  Past Medical History:  Diagnosis Date   Allergy    Alzheimer's dementia ([place])    Arthritis    Atrial fibrillation ([place])    Bronchitis    Cancer ([place])    Bilateral upper tract transitional cell carcinoma   Cardiomegaly    CHF (congestive heart failure) (HCC)    Complication of anesthesia    Difficult to arouse   COPD (chronic obstructive pulmonary disease) ([place])    borderline   pt. denies at preop   Diabetes mellitus without complication ([place])    Controlled by diet and exercise   Diverticulosis of sigmoid colon    Family history of bladder cancer    Family history of colon  cancer    Family history of kidney cancer    Fatty liver    GERD (gastroesophageal reflux disease)    History of hiatal hernia 01/20/2015   Small sliding, noted on CT   History of kidney stones    Hyperlipidemia    Hypertension    Hypothyroidism    Lynch syndrome    PONV (postoperative nausea and vomiting)    Supraumbilical hernia 34/19/3790   Small noted on CT    Patient Active Problem List   Diagnosis Date Noted   Acute viral syndrome 09/02/2020   Symptomatic anemia 09/02/2020   Gross hematuria 03/02/2020   Candidal skin infection 03/02/2020   Degenerative arthritis of right knee 11/09/2019   COPD with acute exacerbation ([place]) 07/30/2018   Lynch syndrome 05/05/2018   Genetic testing 04/28/2018   Adjustment disorder 04/23/2018   Urothelial cancer ([place]) 24/05/7352   Lichen planus 29/92/4268   Obesity hypoventilation syndrome (HCC)    Hypokalemia    Chronic diastolic (congestive) heart failure ([place]) 01/09/2016   History of snoring 05/20/2015   Chronic respiratory failure with hypoxia ([place])    Type 2 diabetes mellitus without complication ([place])    Atrial fibrillation ([place]) 01/06/2015   Hypothyroidism 03/09/2007   Hyperlipidemia 03/09/2007   Morbid obesity ([place]) 03/09/2007   Essential hypertension 03/09/2007   ALLERGIC RHINITIS 03/09/2007   Asthma 03/09/2007   GERD 03/09/2007   INCONTINENCE 03/09/2007    Past Surgical History:  Procedure Laterality Date   ABDOMINAL HYSTERECTOMY     APPENDECTOMY     CHOLECYSTECTOMY     COLONOSCOPY  07/16/2009   CYSTOSCOPY W/ URETERAL STENT PLACEMENT Right 12/04/2017   Procedure: CYSTOSCOPY WITH RETROGRADE PYELOGRAM/URETERAL STENT PLACEMENT;  Surgeon: Festus Aloe, MD;  Location: [facility];  Service: Urology;  Laterality: Right;   CYSTOSCOPY W/ URETERAL STENT PLACEMENT Right 02/26/2018   Procedure: CYSTOSCOPY WITH RETROGRADE PYELOGRAM/URETERAL STENT PLACEMENT;  Surgeon: Kathie Rhodes, MD;  Location: [facility];  Service: Urology;   Laterality: Right;   CYSTOSCOPY WITH URETEROSCOPY AND STENT PLACEMENT Bilateral 01/29/2018   Procedure: BILATERAL URETEROSCOPY AND TUMOR RESECTION  WITH LASER RIGHT STENT EXCHANGE LEFT STENT PLACEMENT;  Surgeon: Ardis Hughs, MD;  Location: [facility];  Service: Urology;  Laterality: Bilateral;   CYSTOSCOPY WITH URETEROSCOPY AND STENT PLACEMENT Bilateral 02/10/2018   Procedure: BILATERAL URETEROSCOPY AND TUMOR EXCISION BILATERAL STENT EXCHANGE;  Surgeon: Ardis Hughs, MD;  Location: [facility];  Service: Urology;  Laterality: Bilateral;   CYSTOSCOPY/RETROGRADE/URETEROSCOPY Bilateral 12/24/2017   Procedure: BILATERAL URETEROSCOPY, RIGHT STENT EXCHANGE, STONE EXTRACTION WITH BASKET BILATERAL RETROGRADE PYELOGRAM, BIOPSY OF RIGHT UPPER POLE TUMOR,  BILATERAL RENAL PELVIS FLUID SENT FOR CYTOLOGY;  Surgeon: Ardis Hughs, MD;  Location: [facility];  Service: Urology;  Laterality: Bilateral;   CYSTOSCOPY/URETEROSCOPY/HOLMIUM LASER/STENT PLACEMENT Bilateral 06/03/2018   Procedure: CYSTOSCOPY/BILATERAL URETEROSCOPY/ LASER TUMOR ABLATION BILATERAL / BILATERAL STENT PLACEMENT/BILATERAL RETROGRADE PYELOGRAM/BLADDER BIOPSY WITH FULGURATION;  Surgeon: Ardis Hughs, MD;  Location: [facility];  Service: Urology;  Laterality: Bilateral;   HOLMIUM LASER APPLICATION Bilateral 40/04/1447   Procedure: HOLMIUM LASER APPLICATION;  Surgeon: Ardis Hughs, MD;  Location: [facility];  Service: Urology;  Laterality: Bilateral;   KNEE SURGERY Right    KNEE SURGERY Left    torn ligament repair   UPPER GI ENDOSCOPY     ureteroscopy laser tumor ablation bilateral stent placement     Dr. Louis Meckel 06-03-18      OB History   No obstetric history on file.     Family History  Problem Relation Age of Onset   Heart disease Mother    Colon cancer Mother 45       d. 27   Tuberculosis Father    Bladder Cancer Brother        urothelial cancer   Bladder Cancer Brother        ureter cancer   Kidney cancer Brother     Skin cancer Sister    Cancer Maternal Aunt        NOS   Colon cancer Other        dx 1st time under 89, second time at 75   Lung cancer Other    Muscular dystrophy Other        d. 31   Testicular cancer Other 26       great nephew   Alcohol abuse Daughter    Drug abuse Daughter    Schizophrenia Daughter    Colon cancer Grandchild     Social History   Tobacco Use   Smoking status: Never   Smokeless tobacco: Never  Vaping Use   Vaping Use: Never used  Substance Use Topics   Alcohol use: No    Alcohol/week: 0.0 standard drinks   Drug use: No    Home Medications Prior to Admission medications   Medication Sig Start Date End Date Taking? Authorizing Provider  acetaminophen (TYLENOL) 500 MG tablet Take 1,000 mg by mouth every 4 (four) hours as needed  for moderate pain (in conjunection with Oxycodone IR 5 mg tablets).    [provider]  albuterol (PROVENTIL) (2.5 MG/3ML) 0.083% nebulizer solution Take 3 mLs (2.5 mg total) by nebulization every 6 (six) hours as needed for wheezing or shortness of breath. 01/22/21   Blanchie Dessert, MD  AMBULATORY NON FORMULARY MEDICATION Rolator 11/09/19   Lyndal Pulley, DO  atorvastatin (LIPITOR) 20 MG tablet TAKE 1 TABLET BY MOUTH DAILY AT 6 PM 01/25/21   Hoyt Koch, MD  benzonatate (TESSALON) 200 MG capsule Take 1 capsule (200 mg total) by mouth 2 (two) times daily as needed for cough. 01/22/21   Blanchie Dessert, MD  calcium carbonate (TUMS EX) 750 MG chewable tablet Chew 1 tablet by mouth as needed for heartburn.    [provider]  ciprofloxacin (CIPRO) 500 MG tablet Take 1 tablet (500 mg total) by mouth every 12 (twelve) hours for 7 days. 02/04/21 02/11/21  Barrie Folk, PA-C  dextromethorphan-guaiFENesin (MUCINEX DM) 30-600 MG 12hr tablet Take 1 tablet by mouth 2 (two) times daily. 09/03/20   Regalado, Belkys A, MD  diltiazem (CARDIZEM CD) 180 MG 24 hr capsule TAKE 1 CAPSULE BY MOUTH EVERY DAY 10/15/20    Hoyt Koch, MD  furosemide (LASIX) 40 MG tablet TAKE 2 TABLETS BY MOUTH EVERY DAY 01/09/21   Hoyt Koch, MD  ipratropium (ATROVENT HFA) 17 MCG/ACT inhaler Inhale 1 puff into the lungs 3 (three) times daily. 09/03/20 09/03/21  Regalado, Jerald Kief A, MD  levothyroxine (SYNTHROID) 100 MCG tablet TAKE 1 TABLET BY MOUTH EVERY DAY Patient taking differently: Take 100 mcg by mouth daily before breakfast. 05/28/20   Hoyt Koch, MD  LORazepam (ATIVAN) 0.5 MG tablet Take 0.5 mg by mouth every 4 (four) hours as needed for anxiety.    [provider]  metoprolol succinate (TOPROL-XL) 50 MG 24 hr tablet TAKE 1 TABLET BY MOUTH DAILY WITH OR IMMEDIATELY FOLLOWING A MEAL. 10/15/20   Hoyt Koch, MD  nystatin ointment (MYCOSTATIN) Apply 1 application topically 2 (two) times daily. Patient taking differently: Apply 1 application topically See admin instructions. Apply to affected areas 2 times a day 03/02/20   Hoyt Koch, MD  oxyCODONE (OXY IR/ROXICODONE) 5 MG immediate release tablet Take 5-10 mg by mouth every 4 (four) hours as needed (for pain).    [provider]  OXYGEN Inhale 2 L/min into the lungs continuous.    [provider]  phenazopyridine (PYRIDIUM) 200 MG tablet Take 1 tablet (200 mg total) by mouth 3 (three) times daily. 02/04/21   Kinnie Feil, PA-C  potassium chloride SA (KLOR-CON M20) 20 MEQ tablet Take 1 tablet (20 mEq total) by mouth daily. Please call our office to schedule a follow up to receive additional refills. 12/21/20   Hoyt Koch, MD  promethazine (PHENERGAN) 25 MG tablet Take 25 mg by mouth every 8 (eight) hours as needed for nausea or vomiting.    [provider]  ipratropium (ATROVENT) 0.02 % nebulizer solution Take 5 mLs (1 mg total) by nebulization every 6 (six) hours as needed for wheezing or shortness of breath. Patient not taking: Reported on 09/02/2020 08/04/18 09/02/20  Georgette Shell, MD   irbesartan (AVAPRO) 150 MG tablet TAKE 1/2 TABLET BY MOUTH EVERY DAY Patient not taking: Reported on 09/02/2020 03/13/20 09/02/20  Hoyt Koch, MD    Allergies    Eliquis [apixaban], Oxycodone, and Codeine  Review of Systems   Review of Systems  All other systems reviewed and are negative.  Physical Exam Updated Vital Signs BP (!) 99/57   Pulse 88   Temp 98.7 F (37.1 C) (Oral)   Resp (!) 28   LMP  (LMP Unknown)   SpO2 98%   Physical Exam Vitals and nursing note reviewed.  Constitutional:      General: She is not in acute distress.    Appearance: She is well-developed. She is obese.  HENT:     Head: Atraumatic.     Comments: Wearing supplemental oxygen. Eyes:     Conjunctiva/sclera: Conjunctivae normal.  Cardiovascular:     Rate and Rhythm: Normal rate and regular rhythm.     Pulses: Normal pulses.     Heart sounds: Normal heart sounds.  Pulmonary:     Effort: Pulmonary effort is normal.  Abdominal:     Palpations: Abdomen is soft.     Tenderness: There is no abdominal tenderness.  Musculoskeletal:        General: No swelling.     Cervical back: Neck supple.     Right lower leg: No edema.     Left lower leg: No edema.  Skin:    Findings: No rash.  Neurological:     Mental Status: She is alert and oriented to person, place, and time.  Psychiatric:        Mood and Affect: Mood normal.    ED Results / Procedures / Treatments   Labs (all labs ordered are listed, but only abnormal results are displayed) Labs Reviewed  URINE CULTURE  CULTURE, BLOOD (ROUTINE X 2)  CULTURE, BLOOD (ROUTINE X 2)  CBC WITH DIFFERENTIAL/PLATELET  COMPREHENSIVE METABOLIC PANEL  LIPASE, BLOOD  URINALYSIS, ROUTINE W REFLEX MICROSCOPIC  LACTIC ACID, PLASMA  LACTIC ACID, PLASMA  BRAIN NATRIURETIC PEPTIDE  TROPONIN I (HIGH SENSITIVITY)    EKG None  Radiology No results found.  Procedures Procedures   Medications Ordered in ED Medications - No data to display  ED  Course  I have reviewed the triage vital signs and the nursing notes.  Pertinent labs & imaging results that were available during my care of the patient were reviewed by me and considered in my medical decision making (see chart for details).    MDM Rules/Calculators/A&P                          BP (!) 125/41   Pulse (!) 121   Temp 98.3 F (36.8 C) (Rectal)   Resp (!) 23   LMP  (LMP Unknown)   SpO2 94%   Final Clinical Impression(s) / ED Diagnoses Final diagnoses:  Shortness of breath  Dehydration    Rx / DC Orders ED Discharge Orders     None      6:20 PM Patient here for evaluation of shortness of breath increased confusion.  Does have history of Alzheimer dementia however patient appears to be able to answer questions appropriately.  She is alert and oriented x4.  She endorsed shortness of breath while at rest.  She is on home oxygen at 2 L.  At this time she appears to be in no acute respiratory discomfort, resting comfortably.  She does not appear to be fluid overloaded.  Patient does have history of metastatic bladder cancer currently undergoing hospice care.  Due to reported confusion, will check UA.  Chest x-ray ordered.  Work-up initiated.  Patient was COVID-positive 2 weeks ago.  Suspect shortness of breath he  be residual of her previous COVID infection.  9:36 PM No hypoxia here, CXR unremarkable.  Head CT scan unremarkable as well.  Labs indicate signs of dehydration with a creatinine of 1.79, elevated lactic acid of 2.8 improved with IV fluid.  Mild elevated BNP of 186.  At this time patient is resting comfortably on 2 L of supplemental oxygen.  I did reach out and spoke with patient's granddaughter, [name], who is also her power of attorney and caregiver.  I discussed finding of the labs, patient also received IV fluid, she is resting comfortably.  I did give options of admitting patient for hydration and for further care versus going home.  I also considered PE but  after discussion we felt the likelihood is low.  Granddaughter felt patient can go home as patient is currently under hospice care.  Will discharge patient shortly.  Return precaution given.  Care discussed with Dr. Roslynn Amble.   Domenic Moras, PA-C 02/07/21 2210    Lucrezia Starch, MD 02/10/21 [phone]

## 2021-02-07 NOTE — Discharge Instructions (Addendum)
You have been evaluated for your symptoms.  You do have signs of dehydration.  Please continue taking an antibiotic and drink plenty of fluid.  If you develop worsening shortness of breath, or worsening symptoms do not hesitate to return to the ER for further care.

## 2021-02-07 NOTE — ED Notes (Signed)
[name] Case [phone] wants an update immediately

## 2021-02-07 NOTE — ED Triage Notes (Signed)
[facility] ems from home for shob, pt did endorse some dizziness on scene. FD placed pt on 15L NR[place] with oxygen saturations of 99%. EMS placed pt on 4L [place] with saturations remaining 96% and above. Pt does complain of still being shob. EMS report pt is alert and oriented X4 with some confusion.  Afib [phone] systolic CBG 493

## 2021-02-08 ENCOUNTER — Other Ambulatory Visit: Payer: Self-pay | Admitting: Internal Medicine

## 2021-02-08 ENCOUNTER — Telehealth: Payer: Self-pay

## 2021-02-08 DIAGNOSIS — J449 Chronic obstructive pulmonary disease, unspecified: Secondary | ICD-10-CM | POA: Diagnosis not present

## 2021-02-08 LAB — SARS CORONAVIRUS 2 (TAT 6-24 HRS): SARS Coronavirus 2: NEGATIVE

## 2021-02-08 NOTE — Telephone Encounter (Signed)
Post ED Visit - Positive Culture Follow-up  Culture report reviewed by antimicrobial stewardship pharmacist: [facility] Team []  Elenor Quinones, Pharm.D. []  Heide Guile, Pharm.D., BCPS AQ-ID []  Parks Neptune, Pharm.D., BCPS []  Alycia Rossetti, Pharm.D., BCPS []  [place], [place].D., BCPS, AAHIVP []  Legrand Como, Pharm.D., BCPS, AAHIVP []  Salome Arnt, PharmD, BCPS []  Johnnette Gourd, PharmD, BCPS []  Hughes Better, PharmD, BCPS []  Leeroy Cha, PharmD []  Laqueta Linden, PharmD, BCPS [x]  Mercy Riding, PharmD  [facility] Team []  Leodis Sias, PharmD []  Lindell Spar, PharmD []  Royetta Asal, PharmD []  Graylin Shiver, Rph []  Rema Fendt) Glennon Mac, PharmD []  Arlyn Dunning, PharmD []  Netta Cedars, PharmD []  Dia Sitter, PharmD []  Leone Haven, PharmD []  Gretta Arab, PharmD []  Theodis Shove, PharmD []  Peggyann Juba, PharmD []  Reuel Boom, PharmD   Positive urine culture Treated with Ciprofloxacin, organism sensitive to the same and no further patient follow-up is required at this time.  Glennon Hamilton 02/08/2021, 10:59 AM

## 2021-02-11 NOTE — Telephone Encounter (Signed)
Verbal order rec'd from Dr Sharlet Salina to refill medication.

## 2021-02-12 LAB — CULTURE, BLOOD (ROUTINE X 2)
Culture: NO GROWTH
Culture: NO GROWTH
Special Requests: ADEQUATE

## 2021-02-20 ENCOUNTER — Other Ambulatory Visit: Payer: Self-pay | Admitting: Internal Medicine

## 2021-03-10 DIAGNOSIS — J449 Chronic obstructive pulmonary disease, unspecified: Secondary | ICD-10-CM | POA: Diagnosis not present

## 2021-03-24 ENCOUNTER — Emergency Department (HOSPITAL_COMMUNITY): Payer: Medicare Other

## 2021-03-24 ENCOUNTER — Emergency Department (HOSPITAL_COMMUNITY)
Admission: EM | Admit: 2021-03-24 | Discharge: 2021-03-24 | Disposition: A | Discharge status: Home / Self Care | Payer: Medicare Other | Attending: Emergency Medicine | Admitting: Emergency Medicine

## 2021-03-24 ENCOUNTER — Encounter (HOSPITAL_COMMUNITY): Payer: Self-pay | Admitting: Emergency Medicine

## 2021-03-24 ENCOUNTER — Other Ambulatory Visit: Payer: Self-pay

## 2021-03-24 DIAGNOSIS — R531 Weakness: Secondary | ICD-10-CM | POA: Diagnosis not present

## 2021-03-24 DIAGNOSIS — I4891 Unspecified atrial fibrillation: Secondary | ICD-10-CM | POA: Insufficient documentation

## 2021-03-24 DIAGNOSIS — Z79899 Other long term (current) drug therapy: Secondary | ICD-10-CM | POA: Diagnosis not present

## 2021-03-24 DIAGNOSIS — F0390 Unspecified dementia without behavioral disturbance: Secondary | ICD-10-CM | POA: Diagnosis not present

## 2021-03-24 DIAGNOSIS — R319 Hematuria, unspecified: Secondary | ICD-10-CM | POA: Diagnosis not present

## 2021-03-24 DIAGNOSIS — Z7951 Long term (current) use of inhaled steroids: Secondary | ICD-10-CM | POA: Diagnosis not present

## 2021-03-24 DIAGNOSIS — Z8551 Personal history of malignant neoplasm of bladder: Secondary | ICD-10-CM | POA: Insufficient documentation

## 2021-03-24 DIAGNOSIS — D649 Anemia, unspecified: Secondary | ICD-10-CM | POA: Insufficient documentation

## 2021-03-24 DIAGNOSIS — E039 Hypothyroidism, unspecified: Secondary | ICD-10-CM | POA: Diagnosis not present

## 2021-03-24 DIAGNOSIS — Z743 Need for continuous supervision: Secondary | ICD-10-CM | POA: Diagnosis not present

## 2021-03-24 DIAGNOSIS — N390 Urinary tract infection, site not specified: Secondary | ICD-10-CM

## 2021-03-24 DIAGNOSIS — J449 Chronic obstructive pulmonary disease, unspecified: Secondary | ICD-10-CM | POA: Diagnosis not present

## 2021-03-24 DIAGNOSIS — I110 Hypertensive heart disease with heart failure: Secondary | ICD-10-CM | POA: Insufficient documentation

## 2021-03-24 DIAGNOSIS — R404 Transient alteration of awareness: Secondary | ICD-10-CM | POA: Diagnosis not present

## 2021-03-24 DIAGNOSIS — I499 Cardiac arrhythmia, unspecified: Secondary | ICD-10-CM | POA: Diagnosis not present

## 2021-03-24 DIAGNOSIS — Z85828 Personal history of other malignant neoplasm of skin: Secondary | ICD-10-CM | POA: Diagnosis not present

## 2021-03-24 DIAGNOSIS — I5032 Chronic diastolic (congestive) heart failure: Secondary | ICD-10-CM | POA: Diagnosis not present

## 2021-03-24 LAB — CBC WITH DIFFERENTIAL/PLATELET
Abs Immature Granulocytes: 0.02 10*3/uL (ref 0.00–0.07)
Basophils Absolute: 0.1 10*3/uL (ref 0.0–0.1)
Basophils Relative: 1 %
Eosinophils Absolute: 0.2 10*3/uL (ref 0.0–0.5)
Eosinophils Relative: 2 %
HCT: 25.9 % — ABNORMAL LOW (ref 36.0–46.0)
Hemoglobin: 7.6 g/dL — ABNORMAL LOW (ref 12.0–15.0)
Immature Granulocytes: 0 %
Lymphocytes Relative: 14 %
Lymphs Abs: 1 10*3/uL (ref 0.7–4.0)
MCH: 27 pg (ref 26.0–34.0)
MCHC: 29.3 g/dL — ABNORMAL LOW (ref 30.0–36.0)
MCV: 91.8 fL (ref 80.0–100.0)
Monocytes Absolute: 0.7 10*3/uL (ref 0.1–1.0)
Monocytes Relative: 9 %
Neutro Abs: 5.5 10*3/uL (ref 1.7–7.7)
Neutrophils Relative %: 74 %
Platelets: 312 10*3/uL (ref 150–400)
RBC: 2.82 MIL/uL — ABNORMAL LOW (ref 3.87–5.11)
RDW: 16.5 % — ABNORMAL HIGH (ref 11.5–15.5)
WBC: 7.5 10*3/uL (ref 4.0–10.5)
nRBC: 0 % (ref 0.0–0.2)

## 2021-03-24 LAB — MAGNESIUM: Magnesium: 2.1 mg/dL (ref 1.7–2.4)

## 2021-03-24 LAB — COMPREHENSIVE METABOLIC PANEL
ALT: 14 U/L (ref 0–44)
AST: 19 U/L (ref 15–41)
Albumin: 2.9 g/dL — ABNORMAL LOW (ref 3.5–5.0)
Alkaline Phosphatase: 452 U/L — ABNORMAL HIGH (ref 38–126)
Anion gap: 5 (ref 5–15)
BUN: 22 mg/dL (ref 8–23)
CO2: 27 mmol/L (ref 22–32)
Calcium: 8.5 mg/dL — ABNORMAL LOW (ref 8.9–10.3)
Chloride: 105 mmol/L (ref 98–111)
Creatinine, Ser: 1.51 mg/dL — ABNORMAL HIGH (ref 0.44–1.00)
GFR, Estimated: 33 mL/min — ABNORMAL LOW (ref >60)
Glucose, Bld: 115 mg/dL — ABNORMAL HIGH (ref 70–99)
Potassium: 4.3 mmol/L (ref 3.5–5.1)
Sodium: 137 mmol/L (ref 135–145)
Total Bilirubin: 0.3 mg/dL (ref 0.3–1.2)
Total Protein: 6.3 g/dL — ABNORMAL LOW (ref 6.5–8.1)

## 2021-03-24 LAB — PREPARE RBC (CROSSMATCH)

## 2021-03-24 LAB — URINALYSIS, MICROSCOPIC (REFLEX): RBC / HPF: >50 RBC/hpf (ref 0–5)

## 2021-03-24 LAB — URINALYSIS, ROUTINE W REFLEX MICROSCOPIC
Bilirubin Urine: NEGATIVE
Glucose, UA: NEGATIVE mg/dL
Ketones, ur: NEGATIVE mg/dL
Nitrite: POSITIVE — AB
Protein, ur: 100 mg/dL — AB
Specific Gravity, Urine: 1.015 (ref 1.005–1.030)
pH: 8.5 — ABNORMAL HIGH (ref 5.0–8.0)

## 2021-03-24 MED ORDER — SODIUM CHLORIDE 0.9 % IV SOLN: 10.0000 mL/h | Freq: Once | INTRAVENOUS | Status: DC

## 2021-03-24 MED ORDER — ACETAMINOPHEN 325 MG PO TABS
650.0000 mg | ORAL_TABLET | Freq: Once | ORAL | Status: DC
  Filled 2021-03-24: qty 2

## 2021-03-24 MED ORDER — CEPHALEXIN 500 MG PO CAPS: 500.0000 mg | ORAL_CAPSULE | Freq: Three times a day (TID) | ORAL | 0 refills | Status: AC

## 2021-03-24 NOTE — ED Provider Notes (Signed)
Emergency Medicine Provider Triage Evaluation Note  [name] Case , a 87 y.o. female  was evaluated in triage.  Pt complains of hematuria x4 days.  Dizziness and weakness since yesterday.  Also reporting shortness of breath, but states this is baseline.  Per EMS, history of A. fib.  Per chart review, patient is not on blood thinners.   Review of Systems  Positive: Hematuria, dizziness, weakness Negative: cp  Physical Exam  LMP  (LMP Unknown)  Gen:   Awake, no distress   Resp:  Normal effort  MSK:   Moves extremities without difficulty  Other:  Irregularly irregular heart rate which is fast at around 120.  No TTP of the abdomen.  No CVA tenderness.  Medical Decision Making  Medically screening exam initiated at 10:11 AM.  Appropriate orders placed.  PRESLYNN GORNICK was informed that the remainder of the evaluation will be completed by another provider, this initial triage assessment does not replace that evaluation, and the importance of remaining in the ED until their evaluation is complete.  Labs, ekg, cxr, ua ordered   Franchot Heidelberg, PA-C 03/24/21 1016    Michie, DO 03/24/21 1234

## 2021-03-24 NOTE — ED Notes (Signed)
RN called [name], granddaughter to give her an update.

## 2021-03-24 NOTE — Discharge Instructions (Addendum)
Follow-up with your hospice care team.  Return to the ER if your hospice care needs or not met or if you have any additional concerns.

## 2021-03-24 NOTE — ED Triage Notes (Signed)
Pt to triage via [facility] from home.  Pt has DNR.  Reports hematuria x 4 days.  Reports generalized weakness and dizziness since yesterday.  No arm drift.  Afib w/RVR '@125'$ .  20 g L AC. Has not taken morning meds.

## 2021-03-24 NOTE — ED Provider Notes (Signed)
[facility] EMERGENCY DEPARTMENT Provider Note   CSN: [id number] Arrival date & time: 03/24/21  1003     History Chief Complaint  Patient presents with   Hematuria   Atrial Fibrillation    [name] Case is a 87 y.o. female.  Patient is a hospice patient with bladder cancer the presents with hematuria ongoing for 4 days.  She is had similar episodes in the past recently having been caused by urinary tract infection she states.  No reports of fevers or cough or vomiting or diarrhea.  She otherwise lives at home with her daughter.        Past Medical History:  Diagnosis Date   Allergy    Alzheimer's dementia ([place])    Arthritis    Atrial fibrillation ([place])    Bronchitis    Cancer ([place])    Bilateral upper tract transitional cell carcinoma   Cardiomegaly    CHF (congestive heart failure) ([place])    Complication of anesthesia    Difficult to arouse   COPD (chronic obstructive pulmonary disease) ([place])    borderline   pt. denies at preop   Diabetes mellitus without complication ([place])    Controlled by diet and exercise   Diverticulosis of sigmoid colon    Family history of bladder cancer    Family history of colon cancer    Family history of kidney cancer    Fatty liver    GERD (gastroesophageal reflux disease)    History of hiatal hernia 01/20/2015   Small sliding, noted on CT   History of kidney stones    Hyperlipidemia    Hypertension    Hypothyroidism    Lynch syndrome    PONV (postoperative nausea and vomiting)    Supraumbilical hernia 0000000   Small noted on CT    Patient Active Problem List   Diagnosis Date Noted   Acute viral syndrome 09/02/2020   Symptomatic anemia 09/02/2020   Gross hematuria 03/02/2020   Candidal skin infection 03/02/2020   Degenerative arthritis of right knee 11/09/2019   COPD with acute exacerbation ([place]) 07/30/2018   Lynch syndrome 05/05/2018   Genetic testing 04/28/2018   Adjustment disorder 04/23/2018    Urothelial cancer ([place]) [id number]   Lichen planus [id number]   Obesity hypoventilation syndrome ([facility])    Hypokalemia    Chronic diastolic (congestive) heart failure ([place]) 01/09/2016   History of snoring 05/20/2015   Chronic respiratory failure with hypoxia ([place])    Type 2 diabetes mellitus without complication ([place])    Atrial fibrillation ([place]) 01/06/2015   Hypothyroidism 03/09/2007   Hyperlipidemia 03/09/2007   Morbid obesity ([place]) 03/09/2007   Essential hypertension 03/09/2007   ALLERGIC RHINITIS 03/09/2007   Asthma 03/09/2007   GERD 03/09/2007   INCONTINENCE 03/09/2007    Past Surgical History:  Procedure Laterality Date   ABDOMINAL HYSTERECTOMY     APPENDECTOMY     CHOLECYSTECTOMY     COLONOSCOPY  07/16/2009   CYSTOSCOPY W/ URETERAL STENT PLACEMENT Right 12/04/2017   Procedure: CYSTOSCOPY WITH RETROGRADE PYELOGRAM/URETERAL STENT PLACEMENT;  Surgeon: Festus Aloe, MD;  Location: [facility];  Service: Urology;  Laterality: Right;   CYSTOSCOPY W/ URETERAL STENT PLACEMENT Right 02/26/2018   Procedure: CYSTOSCOPY WITH RETROGRADE PYELOGRAM/URETERAL STENT PLACEMENT;  Surgeon: Kathie Rhodes, MD;  Location: [facility];  Service: Urology;  Laterality: Right;   CYSTOSCOPY WITH URETEROSCOPY AND STENT PLACEMENT Bilateral 01/29/2018   Procedure: BILATERAL URETEROSCOPY AND TUMOR RESECTION  WITH LASER RIGHT STENT EXCHANGE LEFT STENT PLACEMENT;  Surgeon:  Ardis Hughs, MD;  Location: [facility];  Service: Urology;  Laterality: Bilateral;   CYSTOSCOPY WITH URETEROSCOPY AND STENT PLACEMENT Bilateral 02/10/2018   Procedure: BILATERAL URETEROSCOPY AND TUMOR EXCISION BILATERAL STENT EXCHANGE;  Surgeon: Ardis Hughs, MD;  Location: [facility];  Service: Urology;  Laterality: Bilateral;   CYSTOSCOPY/RETROGRADE/URETEROSCOPY Bilateral 12/24/2017   Procedure: BILATERAL URETEROSCOPY, RIGHT STENT EXCHANGE, STONE EXTRACTION WITH BASKET BILATERAL RETROGRADE PYELOGRAM, BIOPSY OF RIGHT UPPER POLE TUMOR,   BILATERAL RENAL PELVIS FLUID SENT FOR CYTOLOGY;  Surgeon: Ardis Hughs, MD;  Location: [facility];  Service: Urology;  Laterality: Bilateral;   CYSTOSCOPY/URETEROSCOPY/HOLMIUM LASER/STENT PLACEMENT Bilateral 06/03/2018   Procedure: CYSTOSCOPY/BILATERAL URETEROSCOPY/ LASER TUMOR ABLATION BILATERAL / BILATERAL STENT PLACEMENT/BILATERAL RETROGRADE PYELOGRAM/BLADDER BIOPSY WITH FULGURATION;  Surgeon: Ardis Hughs, MD;  Location: [facility];  Service: Urology;  Laterality: Bilateral;   HOLMIUM LASER APPLICATION Bilateral [id number]   Procedure: HOLMIUM LASER APPLICATION;  Surgeon: Ardis Hughs, MD;  Location: [facility];  Service: Urology;  Laterality: Bilateral;   KNEE SURGERY Right    KNEE SURGERY Left    torn ligament repair   UPPER GI ENDOSCOPY     ureteroscopy laser tumor ablation bilateral stent placement     Dr. Louis Meckel 06-03-18      OB History   No obstetric history on file.     Family History  Problem Relation Age of Onset   Heart disease Mother    Colon cancer Mother 61       d. 19   Tuberculosis Father    Bladder Cancer Brother        urothelial cancer   Bladder Cancer Brother        ureter cancer   Kidney cancer Brother    Skin cancer Sister    Cancer Maternal Aunt        NOS   Colon cancer Other        dx 1st time under 5, second time at 71   Lung cancer Other    Muscular dystrophy Other        d. 12   Testicular cancer Other 62       great nephew   Alcohol abuse Daughter    Drug abuse Daughter    Schizophrenia Daughter    Colon cancer Grandchild     Social History   Tobacco Use   Smoking status: Never   Smokeless tobacco: Never  Vaping Use   Vaping Use: Never used  Substance Use Topics   Alcohol use: No    Alcohol/week: 0.0 standard drinks   Drug use: No    Home Medications Prior to Admission medications   Medication Sig Start Date End Date Taking? Authorizing Provider  acetaminophen (TYLENOL) 500 MG tablet Take 1,000 mg by mouth every 4  (four) hours as needed for moderate pain (in conjunection with Oxycodone IR 5 mg tablets).    [provider]  albuterol (PROVENTIL) (2.5 MG/3ML) 0.083% nebulizer solution Take 3 mLs (2.5 mg total) by nebulization every 6 (six) hours as needed for wheezing or shortness of breath. 01/22/21   Blanchie Dessert, MD  AMBULATORY NON FORMULARY MEDICATION Rol[place] 11/09/19   Lyndal Pulley, DO  atorvastatin (LIPITOR) 20 MG tablet Take 1 tablet (20 mg total) by mouth daily. 02/11/21   Hoyt Koch, MD  benzonatate (TESSALON) 200 MG capsule Take 1 capsule (200 mg total) by mouth 2 (two) times daily as needed for cough. 01/22/21   Blanchie Dessert, MD  calcium carbonate (TUMS EX)  750 MG chewable tablet Chew 1 tablet by mouth as needed for heartburn.    [provider]  dextromethorphan-guaiFENesin (MUCINEX DM) 30-600 MG 12hr tablet Take 1 tablet by mouth 2 (two) times daily. 09/03/20   Regalado, Belkys A, MD  diltiazem (CARDIZEM CD) 180 MG 24 hr capsule TAKE 1 CAPSULE BY MOUTH EVERY DAY Patient taking differently: Take 180 mg by mouth daily. 10/15/20   Hoyt Koch, MD  furosemide (LASIX) 40 MG tablet TAKE 2 TABLETS BY MOUTH EVERY DAY Patient taking differently: Take 80 mg by mouth daily. 01/09/21   Hoyt Koch, MD  ipratropium (ATROVENT HFA) 17 MCG/ACT inhaler Inhale 1 puff into the lungs 3 (three) times daily. 09/03/20 09/03/21  Regalado, Jerald Kief A, MD  levothyroxine (SYNTHROID) 100 MCG tablet TAKE 1 TABLET BY MOUTH EVERY DAY Patient taking differently: Take 100 mcg by mouth daily before breakfast. 05/28/20   Hoyt Koch, MD  LORazepam (ATIVAN) 0.5 MG tablet Take 0.5 mg by mouth every 4 (four) hours as needed for anxiety.    [provider]  metoprolol succinate (TOPROL-XL) 50 MG 24 hr tablet TAKE 1 TABLET BY MOUTH DAILY WITH OR IMMEDIATELY FOLLOWING A MEAL. Patient taking differently: Take 50 mg by mouth See admin instructions. TAKE 1 TABLET BY MOUTH  DAILY WITH OR IMMEDIATELY FOLLOWING A MEAL. 10/15/20   Hoyt Koch, MD  nystatin ointment (MYCOSTATIN) Apply 1 application topically 2 (two) times daily. Patient taking differently: Apply 1 application topically See admin instructions. Apply to affected areas 2 times a day 03/02/20   Hoyt Koch, MD  oxyCODONE (OXY IR/ROXICODONE) 5 MG immediate release tablet Take 5-10 mg by mouth every 4 (four) hours as needed (for pain).    [provider]  OXYGEN Inhale 2 L/min into the lungs continuous.    [provider]  phenazopyridine (PYRIDIUM) 200 MG tablet Take 1 tablet (200 mg total) by mouth 3 (three) times daily. 02/04/21   Kinnie Feil, PA-C  potassium chloride SA (KLOR-CON M20) 20 MEQ tablet Take 1 tablet (20 mEq total) by mouth daily. Please call our office to schedule a follow up to receive additional refills. 12/21/20   Hoyt Koch, MD  promethazine (PHENERGAN) 25 MG tablet Take 25 mg by mouth every 8 (eight) hours as needed for nausea or vomiting.    [provider]  ipratropium (ATROVENT) 0.02 % nebulizer solution Take 5 mLs (1 mg total) by nebulization every 6 (six) hours as needed for wheezing or shortness of breath. Patient not taking: Reported on 09/02/2020 08/04/18 09/02/20  Georgette Shell, MD  irbesartan (AVAPRO) 150 MG tablet TAKE 1/2 TABLET BY MOUTH EVERY DAY Patient not taking: Reported on 09/02/2020 03/13/20 09/02/20  Hoyt Koch, MD    Allergies    Eliquis [apixaban], Oxycodone, and Codeine  Review of Systems   Review of Systems  Constitutional:  Negative for fever.  HENT:  Negative for ear pain.   Eyes:  Negative for pain.  Respiratory:  Negative for cough.   Cardiovascular:  Negative for chest pain.  Gastrointestinal:  Negative for abdominal pain.  Genitourinary:  Negative for flank pain.  Musculoskeletal:  Negative for back pain.  Skin:  Negative for rash.  Neurological:  Negative for headaches.   Physical  Exam Updated Vital Signs BP (!) 157/100   Pulse (!) 111   Temp 98.4 F (36.9 C) (Oral)   Resp 14   LMP  (LMP Unknown)   SpO2 100%  Physical Exam Constitutional:      General: She is not in acute distress.    Appearance: Normal appearance.  HENT:     Head: Normocephalic.     Nose: Nose normal.  Eyes:     Extraocular Movements: Extraocular movements intact.  Cardiovascular:     Rate and Rhythm: Normal rate.  Pulmonary:     Effort: Pulmonary effort is normal.  Musculoskeletal:        General: Normal range of motion.     Cervical back: Normal range of motion.  Neurological:     General: No focal deficit present.     Mental Status: She is alert. Mental status is at baseline.    ED Results / Procedures / Treatments   Labs (all labs ordered are listed, but only abnormal results are displayed) Labs Reviewed  CBC WITH DIFFERENTIAL/PLATELET - Abnormal; Notable for the following components:      Result Value   RBC 2.82 (*)    Hemoglobin 7.6 (*)    HCT 25.9 (*)    MCHC 29.3 (*)    RDW 16.5 (*)    All other components within normal limits  COMPREHENSIVE METABOLIC PANEL - Abnormal; Notable for the following components:   Glucose, Bld 115 (*)    Creatinine, Ser 1.51 (*)    Calcium 8.5 (*)    Total Protein 6.3 (*)    Albumin 2.9 (*)    Alkaline Phosphatase 452 (*)    GFR, Estimated 33 (*)    All other components within normal limits  URINALYSIS, ROUTINE W REFLEX MICROSCOPIC - Abnormal; Notable for the following components:   Color, Urine RED (*)    APPearance HAZY (*)    pH 8.5 (*)    Hgb urine dipstick LARGE (*)    Protein, ur 100 (*)    Nitrite POSITIVE (*)    Leukocytes,Ua TRACE (*)    All other components within normal limits  URINALYSIS, MICROSCOPIC (REFLEX) - Abnormal; Notable for the following components:   Bacteria, UA RARE (*)    All other components within normal limits  MAGNESIUM  TYPE AND SCREEN  PREPARE RBC (CROSSMATCH)    EKG None  Radiology DG  Chest 2 View  Result Date: 03/24/2021 CLINICAL DATA:  Atrial fibrillation. EXAM: CHEST - 2 VIEW COMPARISON:  02/07/2021 FINDINGS: The cardio pericardial silhouette is enlarged. There is pulmonary vascular congestion without overt pulmonary edema. Streaky opacity at the left base suggest atelectasis. No pleural effusion. The visualized bony structures of the thorax show no acute abnormality. Degenerative changes noted right shoulder. IMPRESSION: Enlargement of the cardiopericardial silhouette with pulmonary vascular congestion. Electronically Signed   By: Misty Stanley M.D.   On: 03/24/2021 11:19    Procedures .Critical Care  Date/Time: 03/24/2021 4:17 PM Performed by: Luna Fuse, MD Authorized by: Luna Fuse, MD   Critical care provider statement:    Critical care time (minutes):  30   Critical care time was exclusive of:  Separately billable procedures and treating other patients and teaching time   Critical care was necessary to treat or prevent imminent or life-threatening deterioration of the following conditions:  Circulatory failure Comments:     Symptomatic anemia requiring emergent blood transfusion.   Medications Ordered in ED Medications  acetaminophen (TYLENOL) tablet 650 mg (650 mg Oral Patient Refused/Not Given 03/24/21 1159)  0.9 %  sodium chloride infusion (has no administration in time range)    ED Course  I have reviewed the triage vital signs  and the nursing notes.  Pertinent labs & imaging results that were available during my care of the patient were reviewed by me and considered in my medical decision making (see chart for details).    MDM Rules/Calculators/A&P                           Patient found to be anemic hemoglobin 7.6.  Urinalysis was positive for urinary tract infection.  Risk and benefits of blood transfusion discussed.  Family and patient would like to pursue blood transfusion for symptom management.  1 unit of PRBCs given.  Patient will  be discharged home with a prescription of antibiotics.  Advise continued outpatient follow-up with her hospice team.   Final Clinical Impression(s) / ED Diagnoses Final diagnoses:  Symptomatic anemia  Urinary tract infection with hematuria, site unspecified    Rx / DC Orders ED Discharge Orders     None        Luna Fuse, MD 03/24/21 [phone]

## 2021-03-25 LAB — BPAM RBC
Blood Product Expiration Date: 202208262359
ISSUE DATE / TIME: 202207241246
Unit Type and Rh: 5100

## 2021-03-25 LAB — TYPE AND SCREEN
ABO/RH(D): O POS
Antibody Screen: NEGATIVE
Unit division: 0

## 2021-04-10 DIAGNOSIS — J449 Chronic obstructive pulmonary disease, unspecified: Secondary | ICD-10-CM | POA: Diagnosis not present

## 2021-05-11 DIAGNOSIS — J449 Chronic obstructive pulmonary disease, unspecified: Secondary | ICD-10-CM | POA: Diagnosis not present

## 2021-06-10 DIAGNOSIS — J449 Chronic obstructive pulmonary disease, unspecified: Secondary | ICD-10-CM | POA: Diagnosis not present

## 2021-07-11 DIAGNOSIS — J449 Chronic obstructive pulmonary disease, unspecified: Secondary | ICD-10-CM | POA: Diagnosis not present

## 2021-08-09 ENCOUNTER — Emergency Department (HOSPITAL_COMMUNITY)
Admission: EM | Admit: 2021-08-09 | Discharge: 2021-08-09 | Disposition: A | Discharge status: Home / Self Care | Payer: Medicare Other | Attending: Emergency Medicine | Admitting: Emergency Medicine

## 2021-08-09 DIAGNOSIS — E119 Type 2 diabetes mellitus without complications: Secondary | ICD-10-CM | POA: Diagnosis not present

## 2021-08-09 DIAGNOSIS — I110 Hypertensive heart disease with heart failure: Secondary | ICD-10-CM | POA: Diagnosis not present

## 2021-08-09 DIAGNOSIS — I5032 Chronic diastolic (congestive) heart failure: Secondary | ICD-10-CM | POA: Insufficient documentation

## 2021-08-09 DIAGNOSIS — Z743 Need for continuous supervision: Secondary | ICD-10-CM | POA: Diagnosis not present

## 2021-08-09 DIAGNOSIS — E039 Hypothyroidism, unspecified: Secondary | ICD-10-CM | POA: Diagnosis not present

## 2021-08-09 DIAGNOSIS — R319 Hematuria, unspecified: Secondary | ICD-10-CM | POA: Diagnosis not present

## 2021-08-09 DIAGNOSIS — G309 Alzheimer's disease, unspecified: Secondary | ICD-10-CM | POA: Diagnosis not present

## 2021-08-09 DIAGNOSIS — Z7951 Long term (current) use of inhaled steroids: Secondary | ICD-10-CM | POA: Diagnosis not present

## 2021-08-09 DIAGNOSIS — F0280 Dementia in other diseases classified elsewhere without behavioral disturbance: Secondary | ICD-10-CM | POA: Diagnosis not present

## 2021-08-09 DIAGNOSIS — R310 Gross hematuria: Secondary | ICD-10-CM

## 2021-08-09 DIAGNOSIS — J441 Chronic obstructive pulmonary disease with (acute) exacerbation: Secondary | ICD-10-CM | POA: Insufficient documentation

## 2021-08-09 DIAGNOSIS — J45909 Unspecified asthma, uncomplicated: Secondary | ICD-10-CM | POA: Insufficient documentation

## 2021-08-09 DIAGNOSIS — N3001 Acute cystitis with hematuria: Secondary | ICD-10-CM | POA: Diagnosis not present

## 2021-08-09 DIAGNOSIS — Z79899 Other long term (current) drug therapy: Secondary | ICD-10-CM | POA: Insufficient documentation

## 2021-08-09 DIAGNOSIS — Z8551 Personal history of malignant neoplasm of bladder: Secondary | ICD-10-CM | POA: Diagnosis not present

## 2021-08-09 DIAGNOSIS — Z85828 Personal history of other malignant neoplasm of skin: Secondary | ICD-10-CM | POA: Insufficient documentation

## 2021-08-09 DIAGNOSIS — I499 Cardiac arrhythmia, unspecified: Secondary | ICD-10-CM | POA: Diagnosis not present

## 2021-08-09 LAB — URINALYSIS, ROUTINE W REFLEX MICROSCOPIC
Bilirubin Urine: NEGATIVE
Glucose, UA: NEGATIVE mg/dL
Ketones, ur: NEGATIVE mg/dL
Nitrite: NEGATIVE
Protein, ur: 100 mg/dL — AB
RBC / HPF: >50 RBC/hpf — ABNORMAL HIGH (ref 0–5)
Specific Gravity, Urine: 1.014 (ref 1.005–1.030)
WBC, UA: >50 WBC/hpf — ABNORMAL HIGH (ref 0–5)
pH: 7 (ref 5.0–8.0)

## 2021-08-09 LAB — CBC
HCT: 33.4 % — ABNORMAL LOW (ref 36.0–46.0)
Hemoglobin: 9.9 g/dL — ABNORMAL LOW (ref 12.0–15.0)
MCH: 26.4 pg (ref 26.0–34.0)
MCHC: 29.6 g/dL — ABNORMAL LOW (ref 30.0–36.0)
MCV: 89.1 fL (ref 80.0–100.0)
Platelets: 279 10*3/uL (ref 150–400)
RBC: 3.75 MIL/uL — ABNORMAL LOW (ref 3.87–5.11)
RDW: 17.3 % — ABNORMAL HIGH (ref 11.5–15.5)
WBC: 7.4 10*3/uL (ref 4.0–10.5)
nRBC: 0 % (ref 0.0–0.2)

## 2021-08-09 LAB — BASIC METABOLIC PANEL
Anion gap: 8 (ref 5–15)
BUN: 13 mg/dL (ref 8–23)
CO2: 24 mmol/L (ref 22–32)
Calcium: 8.5 mg/dL — ABNORMAL LOW (ref 8.9–10.3)
Chloride: 102 mmol/L (ref 98–111)
Creatinine, Ser: 1.28 mg/dL — ABNORMAL HIGH (ref 0.44–1.00)
GFR, Estimated: 41 mL/min — ABNORMAL LOW (ref >60)
Glucose, Bld: 109 mg/dL — ABNORMAL HIGH (ref 70–99)
Potassium: 4.3 mmol/L (ref 3.5–5.1)
Sodium: 134 mmol/L — ABNORMAL LOW (ref 135–145)

## 2021-08-09 MED ORDER — CEPHALEXIN 500 MG PO CAPS: 500.0000 mg | ORAL_CAPSULE | Freq: Two times a day (BID) | ORAL | 0 refills | Status: AC

## 2021-08-09 MED ORDER — CEPHALEXIN 250 MG PO CAPS
500.0000 mg | ORAL_CAPSULE | Freq: Once | ORAL | Status: AC
  Administered 2021-08-09: 500 mg via ORAL
  Filled 2021-08-09: qty 2

## 2021-08-09 NOTE — ED Provider Notes (Signed)
[facility] EMERGENCY DEPARTMENT Provider Note   CSN: [id number] Arrival date & time: 08/09/21  1528     History No chief complaint on file.   [name] Case is a 87 y.o. female.  The history is provided by the patient and medical records. No language interpreter was used.  Hematuria This is a recurrent problem. The current episode started more than 2 days ago. The problem occurs constantly. The problem has not changed since onset.Pertinent negatives include no chest pain, no abdominal pain, no headaches and no shortness of breath. Nothing aggravates the symptoms. Nothing relieves the symptoms. She has tried nothing for the symptoms. The treatment provided no relief.      Past Medical History:  Diagnosis Date   Allergy    Alzheimer's dementia ([place])    Arthritis    Atrial fibrillation ([place])    Bronchitis    Cancer ([place])    Bilateral upper tract transitional cell carcinoma   Cardiomegaly    CHF (congestive heart failure) ([place])    Complication of anesthesia    Difficult to arouse   COPD (chronic obstructive pulmonary disease) ([place])    borderline   pt. denies at preop   Diabetes mellitus without complication ([place])    Controlled by diet and exercise   Diverticulosis of sigmoid colon    Family history of bladder cancer    Family history of colon cancer    Family history of kidney cancer    Fatty liver    GERD (gastroesophageal reflux disease)    History of hiatal hernia 01/20/2015   Small sliding, noted on CT   History of kidney stones    Hyperlipidemia    Hypertension    Hypothyroidism    Lynch syndrome    PONV (postoperative nausea and vomiting)    Supraumbilical hernia 65/46/5035   Small noted on CT    Patient Active Problem List   Diagnosis Date Noted   Acute viral syndrome 09/02/2020   Symptomatic anemia 09/02/2020   Gross hematuria 03/02/2020   Candidal skin infection 03/02/2020   Degenerative arthritis of right knee 11/09/2019   COPD  with acute exacerbation ([place]) 07/30/2018   Lynch syndrome 05/05/2018   Genetic testing 04/28/2018   Adjustment disorder 04/23/2018   Urothelial cancer ([place]) 46/56/8127   Lichen planus 51/70/0174   Obesity hypoventilation syndrome ([facility])    Hypokalemia    Chronic diastolic (congestive) heart failure ([place]) 01/09/2016   History of snoring 05/20/2015   Chronic respiratory failure with hypoxia ([place])    Type 2 diabetes mellitus without complication ([place])    Atrial fibrillation ([place]) 01/06/2015   Hypothyroidism 03/09/2007   Hyperlipidemia 03/09/2007   Morbid obesity ([place]) 03/09/2007   Essential hypertension 03/09/2007   ALLERGIC RHINITIS 03/09/2007   Asthma 03/09/2007   GERD 03/09/2007   INCONTINENCE 03/09/2007    Past Surgical History:  Procedure Laterality Date   ABDOMINAL HYSTERECTOMY     APPENDECTOMY     CHOLECYSTECTOMY     COLONOSCOPY  07/16/2009   CYSTOSCOPY W/ URETERAL STENT PLACEMENT Right 12/04/2017   Procedure: CYSTOSCOPY WITH RETROGRADE PYELOGRAM/URETERAL STENT PLACEMENT;  Surgeon: Festus Aloe, MD;  Location: [facility];  Service: Urology;  Laterality: Right;   CYSTOSCOPY W/ URETERAL STENT PLACEMENT Right 02/26/2018   Procedure: CYSTOSCOPY WITH RETROGRADE PYELOGRAM/URETERAL STENT PLACEMENT;  Surgeon: Kathie Rhodes, MD;  Location: [facility];  Service: Urology;  Laterality: Right;   CYSTOSCOPY WITH URETEROSCOPY AND STENT PLACEMENT Bilateral 01/29/2018   Procedure: BILATERAL URETEROSCOPY AND TUMOR RESECTION  WITH LASER RIGHT STENT EXCHANGE LEFT STENT PLACEMENT;  Surgeon: Ardis Hughs, MD;  Location: [facility];  Service: Urology;  Laterality: Bilateral;   CYSTOSCOPY WITH URETEROSCOPY AND STENT PLACEMENT Bilateral 02/10/2018   Procedure: BILATERAL URETEROSCOPY AND TUMOR EXCISION BILATERAL STENT EXCHANGE;  Surgeon: Ardis Hughs, MD;  Location: [facility];  Service: Urology;  Laterality: Bilateral;   CYSTOSCOPY/RETROGRADE/URETEROSCOPY Bilateral 12/24/2017   Procedure: BILATERAL  URETEROSCOPY, RIGHT STENT EXCHANGE, STONE EXTRACTION WITH BASKET BILATERAL RETROGRADE PYELOGRAM, BIOPSY OF RIGHT UPPER POLE TUMOR,  BILATERAL RENAL PELVIS FLUID SENT FOR CYTOLOGY;  Surgeon: Ardis Hughs, MD;  Location: [facility];  Service: Urology;  Laterality: Bilateral;   CYSTOSCOPY/URETEROSCOPY/HOLMIUM LASER/STENT PLACEMENT Bilateral 06/03/2018   Procedure: CYSTOSCOPY/BILATERAL URETEROSCOPY/ LASER TUMOR ABLATION BILATERAL / BILATERAL STENT PLACEMENT/BILATERAL RETROGRADE PYELOGRAM/BLADDER BIOPSY WITH FULGURATION;  Surgeon: Ardis Hughs, MD;  Location: [facility];  Service: Urology;  Laterality: Bilateral;   HOLMIUM LASER APPLICATION Bilateral 16/09/958   Procedure: HOLMIUM LASER APPLICATION;  Surgeon: Ardis Hughs, MD;  Location: [facility];  Service: Urology;  Laterality: Bilateral;   KNEE SURGERY Right    KNEE SURGERY Left    torn ligament repair   UPPER GI ENDOSCOPY     ureteroscopy laser tumor ablation bilateral stent placement     Dr. Louis Meckel 06-03-18      OB History   No obstetric history on file.     Family History  Problem Relation Age of Onset   Heart disease Mother    Colon cancer Mother 77       d. 9   Tuberculosis Father    Bladder Cancer Brother        urothelial cancer   Bladder Cancer Brother        ureter cancer   Kidney cancer Brother    Skin cancer Sister    Cancer Maternal Aunt        NOS   Colon cancer Other        dx 1st time under 50, second time at 89   Lung cancer Other    Muscular dystrophy Other        d. 68   Testicular cancer Other 31       great nephew   Alcohol abuse Daughter    Drug abuse Daughter    Schizophrenia Daughter    Colon cancer Grandchild     Social History   Tobacco Use   Smoking status: Never   Smokeless tobacco: Never  Vaping Use   Vaping Use: Never used  Substance Use Topics   Alcohol use: No    Alcohol/week: 0.0 standard drinks   Drug use: No    Home Medications Prior to Admission medications    Medication Sig Start Date End Date Taking? Authorizing Provider  acetaminophen (TYLENOL) 500 MG tablet Take 1,000 mg by mouth every 4 (four) hours as needed for moderate pain (in conjunection with Oxycodone IR 5 mg tablets).    [provider]  albuterol (PROVENTIL) (2.5 MG/3ML) 0.083% nebulizer solution Take 3 mLs (2.5 mg total) by nebulization every 6 (six) hours as needed for wheezing or shortness of breath. 01/22/21   Blanchie Dessert, MD  AMBULATORY NON FORMULARY MEDICATION Rolator 11/09/19   Lyndal Pulley, DO  atorvastatin (LIPITOR) 20 MG tablet Take 1 tablet (20 mg total) by mouth daily. 02/11/21   Hoyt Koch, MD  benzonatate (TESSALON) 200 MG capsule Take 1 capsule (200 mg total) by mouth 2 (two) times daily as needed for cough. 01/22/21  Blanchie Dessert, MD  calcium carbonate (TUMS EX) 750 MG chewable tablet Chew 1 tablet by mouth as needed for heartburn.    [provider]  dextromethorphan-guaiFENesin (MUCINEX DM) 30-600 MG 12hr tablet Take 1 tablet by mouth 2 (two) times daily. 09/03/20   Regalado, Belkys A, MD  diltiazem (CARDIZEM CD) 180 MG 24 hr capsule TAKE 1 CAPSULE BY MOUTH EVERY DAY Patient taking differently: Take 180 mg by mouth daily. 10/15/20   Hoyt Koch, MD  furosemide (LASIX) 40 MG tablet TAKE 2 TABLETS BY MOUTH EVERY DAY Patient taking differently: Take 80 mg by mouth daily. 01/09/21   Hoyt Koch, MD  ipratropium (ATROVENT HFA) 17 MCG/ACT inhaler Inhale 1 puff into the lungs 3 (three) times daily. 09/03/20 09/03/21  Regalado, Jerald Kief A, MD  levothyroxine (SYNTHROID) 100 MCG tablet TAKE 1 TABLET BY MOUTH EVERY DAY Patient taking differently: Take 100 mcg by mouth daily before breakfast. 05/28/20   Hoyt Koch, MD  LORazepam (ATIVAN) 0.5 MG tablet Take 0.5 mg by mouth every 4 (four) hours as needed for anxiety.    [provider]  metoprolol succinate (TOPROL-XL) 50 MG 24 hr tablet TAKE 1 TABLET BY MOUTH DAILY  WITH OR IMMEDIATELY FOLLOWING A MEAL. Patient taking differently: Take 50 mg by mouth See admin instructions. TAKE 1 TABLET BY MOUTH DAILY WITH OR IMMEDIATELY FOLLOWING A MEAL. 10/15/20   Hoyt Koch, MD  nystatin ointment (MYCOSTATIN) Apply 1 application topically 2 (two) times daily. Patient taking differently: Apply 1 application topically See admin instructions. Apply to affected areas 2 times a day 03/02/20   Hoyt Koch, MD  oxyCODONE (OXY IR/ROXICODONE) 5 MG immediate release tablet Take 5-10 mg by mouth every 4 (four) hours as needed (for pain).    [provider]  OXYGEN Inhale 2 L/min into the lungs continuous.    [provider]  phenazopyridine (PYRIDIUM) 200 MG tablet Take 1 tablet (200 mg total) by mouth 3 (three) times daily. 02/04/21   Kinnie Feil, PA-C  potassium chloride SA (KLOR-CON M20) 20 MEQ tablet Take 1 tablet (20 mEq total) by mouth daily. Please call our office to schedule a follow up to receive additional refills. 12/21/20   Hoyt Koch, MD  promethazine (PHENERGAN) 25 MG tablet Take 25 mg by mouth every 8 (eight) hours as needed for nausea or vomiting.    [provider]  ipratropium (ATROVENT) 0.02 % nebulizer solution Take 5 mLs (1 mg total) by nebulization every 6 (six) hours as needed for wheezing or shortness of breath. Patient not taking: Reported on 09/02/2020 08/04/18 09/02/20  Georgette Shell, MD  irbesartan (AVAPRO) 150 MG tablet TAKE 1/2 TABLET BY MOUTH EVERY DAY Patient not taking: Reported on 09/02/2020 03/13/20 09/02/20  Hoyt Koch, MD    Allergies    Eliquis [apixaban], Oxycodone, and Codeine  Review of Systems   Review of Systems  Constitutional:  Positive for fatigue. Negative for chills, diaphoresis and fever.  HENT:  Negative for congestion.   Respiratory:  Negative for cough, chest tightness, shortness of breath and wheezing.   Cardiovascular:  Negative for chest pain, palpitations  and leg swelling.  Gastrointestinal:  Negative for abdominal pain, constipation, diarrhea, nausea and vomiting.  Genitourinary:  Positive for hematuria.  Musculoskeletal:  Negative for back pain, neck pain and neck stiffness.  Skin:  Positive for rash (chronic dry rash on torso). Negative for wound.  Neurological:  Negative for headaches.  Psychiatric/Behavioral:  Positive  for hallucinations (improved from prior per family after haldol started). Negative for agitation and confusion.   All other systems reviewed and are negative.  Physical Exam Updated Vital Signs BP 117/72 (BP Location: Left Arm)   Pulse 60   Temp 98 F (36.7 C) (Oral)   Resp 17   LMP  (LMP Unknown)   SpO2 97%   Physical Exam Vitals and nursing note reviewed.  Constitutional:      General: She is not in acute distress.    Appearance: She is well-developed. She is not ill-appearing, toxic-appearing or diaphoretic.  HENT:     Head: Normocephalic and atraumatic.     Nose: No congestion or rhinorrhea.     Mouth/Throat:     Mouth: Mucous membranes are moist.     Pharynx: No oropharyngeal exudate or posterior oropharyngeal erythema.  Eyes:     Extraocular Movements: Extraocular movements intact.     Conjunctiva/sclera: Conjunctivae normal.     Pupils: Pupils are equal, round, and reactive to light.  Cardiovascular:     Rate and Rhythm: Normal rate and regular rhythm.     Heart sounds: No murmur heard. Pulmonary:     Effort: Pulmonary effort is normal. No respiratory distress.     Breath sounds: Normal breath sounds. No wheezing, rhonchi or rales.  Chest:     Chest wall: No tenderness.  Abdominal:     General: Abdomen is flat.     Palpations: Abdomen is soft.     Tenderness: There is no abdominal tenderness. There is no right CVA tenderness, left CVA tenderness, guarding or rebound.  Musculoskeletal:        General: No swelling or tenderness.     Cervical back: Neck supple. No tenderness.     Right lower  leg: No edema.     Left lower leg: No edema.  Skin:    General: Skin is warm and dry.     Capillary Refill: Capillary refill takes less than 2 seconds.     Findings: No erythema.  Neurological:     Mental Status: She is alert. Mental status is at baseline.  Psychiatric:        Mood and Affect: Mood normal.    ED Results / Procedures / Treatments   Labs (all labs ordered are listed, but only abnormal results are displayed) Labs Reviewed  URINALYSIS, ROUTINE W REFLEX MICROSCOPIC - Abnormal; Notable for the following components:      Result Value   APPearance CLOUDY (*)    Hgb urine dipstick LARGE (*)    Protein, ur 100 (*)    Leukocytes,Ua MODERATE (*)    RBC / HPF >50 (*)    WBC, UA >50 (*)    Bacteria, UA FEW (*)    All other components within normal limits  CBC - Abnormal; Notable for the following components:   RBC 3.75 (*)    Hemoglobin 9.9 (*)    HCT 33.4 (*)    MCHC 29.6 (*)    RDW 17.3 (*)    All other components within normal limits  BASIC METABOLIC PANEL - Abnormal; Notable for the following components:   Sodium 134 (*)    Glucose, Bld 109 (*)    Creatinine, Ser 1.28 (*)    Calcium 8.5 (*)    GFR, Estimated 41 (*)    All other components within normal limits  URINE CULTURE    EKG None  Radiology No results found.  Procedures Procedures   Medications Ordered  in ED Medications  cephALEXin (KEFLEX) capsule 500 mg (has no administration in time range)    ED Course  I have reviewed the triage vital signs and the nursing notes.  Pertinent labs & imaging results that were available during my care of the patient were reviewed by me and considered in my medical decision making (see chart for details).    MDM Rules/Calculators/A&P                           [name] Case is a 87 y.o. female with a past medical history significant for hypertension, diabetes, hyperlipidemia, atrial fibrillation, COPD on home oxygen, and bladder cancer with metastasis  currently on hospice who presents with fatigue and hematuria.  According to daughter who is accompanied patient, patient has had hematuria for the last few days which seems similar to when she had urinary tract infections and needed transfusion.  Patient has had some fatigue worsened for the last few days.  Per granddaughter who is the healthcare power attorney on the phone, the patient has had some intermittent delirium chronically and was started on Haldol several days ago and seems to be improving.  She also has a dry rash on her chest from scratching and picking at the skin and was started on hydrocortisone cream several days ago and granddaughter is encouraged by how it is doing.  Due to the hematuria and history of needing blood and antibiotics, that is the main reason that she was sent in.  On my exam, lungs were clear and chest was nontender.  Abdomen was nontender.  She has a dry rash on her upper torso but no evidence of acute cellulitis.  Legs were nontender and had some dryness as well.  Patient moving all extremities and is pleasant and denying other complaints.  Clear speech and pupils are symmetric and reactive with normal extraocular movements.  Patient is not in acute distress initially.  After discussion with the family, we will get urinalysis and culture to look for infection which I suspect has recurred.  If there is no UTI I suspect her hematuria is due to the bladder cancer which is well-known.  Her blood work was started in triage which did show improvement in her hemoglobin up to 9.9 from 7.6.  Discussed with the family and we agree she does not need blood transfusion at this time.  Also her kidney function has improved from prior and her electrolytes did not show any critical acute changes.  No leukocytosis present.  Normal platelets.  If urinalysis is reassuring, anticipate discharge home.  Urinalysis does show evidence of infection.  I spoke with pharmacy who recommended Keflex  based on the previous cultures.  Keflex prescription printed and I spoke to the healthcare power attorney who agrees with discharge antibiotics.  We will have her follow-up with her physician.  They requested PT are transport home which I will speak to nursing about arranging.  Patient be discharged.   Final Clinical Impression(s) / ED Diagnoses Final diagnoses:  Gross hematuria  Acute cystitis with hematuria    Rx / DC Orders ED Discharge Orders          Ordered    cephALEXin (KEFLEX) 500 MG capsule  2 times daily        08/09/21 1959           Clinical Impression: 1. Gross hematuria   2. Acute cystitis with hematuria  Disposition: Discharge  Condition: Good  I have discussed the results, Dx and Tx plan with the pt(& family if present). He/she/they expressed understanding and agree(s) with the plan. Discharge instructions discussed at great length. Strict return precautions discussed and pt &/or family have verbalized understanding of the instructions. No further questions at time of discharge.    New Prescriptions   CEPHALEXIN (KEFLEX) 500 MG CAPSULE    Take 1 capsule (500 mg total) by mouth 2 (two) times daily for 10 days.    Follow Up: Hoyt Koch, MD [place] [address] [phone]     [facility] EMERGENCY DEPARTMENT [address] [id number] mc [place]       Hailie Searight, Gwenyth Allegra, MD 08/09/21 2030

## 2021-08-09 NOTE — ED Notes (Signed)
Daughter/caregiver Clara [phone] would like an update

## 2021-08-09 NOTE — ED Notes (Signed)
RN and NT walked pt to bathroom and changed diaper

## 2021-08-09 NOTE — ED Triage Notes (Signed)
Pt came from home. Hospice care. C/O hematuria.  Per EMS, 337ml-400ml blood in urine over the past few days. Pt has kidney failure. Pt stated that she does not want to die at home, that's why they brought her here.   Bp 142/80 HR 120  Spo 95% 2L ( baseline)

## 2021-08-09 NOTE — ED Notes (Signed)
Discharge instructions reviewed with patient and Family members. Patient and family members verbalized understanding of instructions. Follow-up care and medications were reviewed. Patient ambulatory with steady gait. VSS upon discharge.

## 2021-08-09 NOTE — Discharge Instructions (Addendum)
Her history, exam, work-up today are consistent with urinary tract infection likely causing the hematuria in the context of her known bladder cancer.  Her hemoglobin was improved from prior and we do not feel she needs transfusion today.  Her kidney function has improved as well.  Please have her rest and stay hydrated and continue her home medications but please start the antibiotic to treat the infection.  I spoke with pharmacy to help determine the best and most appropriate antibiotic for her.  Please have her follow-up with her primary doctor and if any symptoms change or worsen, please return to the nearest emergency department.

## 2021-08-10 DIAGNOSIS — J449 Chronic obstructive pulmonary disease, unspecified: Secondary | ICD-10-CM | POA: Diagnosis not present

## 2021-08-10 LAB — URINE CULTURE: Culture: <10000 — AB

## 2021-09-10 DIAGNOSIS — J449 Chronic obstructive pulmonary disease, unspecified: Secondary | ICD-10-CM | POA: Diagnosis not present

## 2021-10-11 DIAGNOSIS — J449 Chronic obstructive pulmonary disease, unspecified: Secondary | ICD-10-CM | POA: Diagnosis not present

## 2021-11-08 DIAGNOSIS — J449 Chronic obstructive pulmonary disease, unspecified: Secondary | ICD-10-CM | POA: Diagnosis not present

## 2021-12-09 DIAGNOSIS — J449 Chronic obstructive pulmonary disease, unspecified: Secondary | ICD-10-CM | POA: Diagnosis not present

## 2022-01-08 DIAGNOSIS — J449 Chronic obstructive pulmonary disease, unspecified: Secondary | ICD-10-CM | POA: Diagnosis not present

## 2022-01-20 ENCOUNTER — Ambulatory Visit: Admitting: Internal Medicine

## 2022-01-22 ENCOUNTER — Telehealth: Payer: Self-pay | Admitting: Internal Medicine

## 2022-01-22 NOTE — Telephone Encounter (Signed)
Granddaughter called in and states pt was diagnosed with cancer and was under the care of [facility].   Cancer then turned terminal and pt was placed under hospice care. Pt is not bedridden and they state they need another referral placed for admission process again before continuing care. States pt utilized [facility] out of [employer] as hospice care.   Informed pt that MD is out of office. Requesting a call back ASAP with best recommendations.   Please contact [phone]

## 2022-01-23 DIAGNOSIS — I5041 Acute combined systolic (congestive) and diastolic (congestive) heart failure: Secondary | ICD-10-CM | POA: Diagnosis not present

## 2022-01-23 DIAGNOSIS — J441 Chronic obstructive pulmonary disease with (acute) exacerbation: Secondary | ICD-10-CM | POA: Diagnosis not present

## 2022-01-23 NOTE — Telephone Encounter (Signed)
Since I have not seen since 2021 would need visit in person or virtual to do this referral and then we can place since a hospice referral requires that I am certifying about her current health and prognosis.

## 2022-01-23 NOTE — Telephone Encounter (Signed)
Pt has been scheduled for 01/30/2022 at 9 am for a virtual visit

## 2022-01-30 ENCOUNTER — Telehealth: Payer: Medicare Other | Admitting: Internal Medicine

## 2022-02-08 DIAGNOSIS — J449 Chronic obstructive pulmonary disease, unspecified: Secondary | ICD-10-CM | POA: Diagnosis not present

## 2022-02-10 ENCOUNTER — Telehealth: Payer: Medicare Other | Admitting: Internal Medicine

## 2022-02-11 ENCOUNTER — Telehealth: Payer: Self-pay | Admitting: Internal Medicine

## 2022-02-11 NOTE — Telephone Encounter (Signed)
Pt requesting call back from assistant to figure out about virtual appt. to ghet pt started with hospice.

## 2022-02-11 NOTE — Telephone Encounter (Signed)
[employer]. LVM asking her to give our office a call to schedule a virtual visit. Office number was provided

## 2022-02-11 NOTE — Telephone Encounter (Signed)
We can certainly try again

## 2022-02-17 ENCOUNTER — Telehealth (INDEPENDENT_AMBULATORY_CARE_PROVIDER_SITE_OTHER): Payer: Medicare Other | Admitting: Internal Medicine

## 2022-02-17 ENCOUNTER — Encounter: Payer: Self-pay | Admitting: Internal Medicine

## 2022-02-17 DIAGNOSIS — C787 Secondary malignant neoplasm of liver and intrahepatic bile duct: Secondary | ICD-10-CM | POA: Diagnosis not present

## 2022-02-17 DIAGNOSIS — C689 Malignant neoplasm of urinary organ, unspecified: Secondary | ICD-10-CM | POA: Diagnosis not present

## 2022-02-17 DIAGNOSIS — D649 Anemia, unspecified: Secondary | ICD-10-CM

## 2022-02-17 NOTE — Progress Notes (Signed)
Virtual Visit via Video Note  I connected with [name] Case on 02/17/22 at  1:40 PM EDT by a video enabled telemedicine application and verified that I am speaking with the correct person using two identifiers.  The patient and the provider were at separate locations throughout the entire encounter. Patient location: home, Provider location: work   I discussed the limitations of evaluation and management by telemedicine and the availability of in person appointments. The patient expressed understanding and agreed to proceed. The patient and the provider were the only parties present for the visit unless noted in HPI below.  History of Present Illness: The patient is a 88 y.o. female with visit for cancer. Bedbound and having some memory changes. Needs to get back on hospice and was kicked out due to lack of progression.  Observations/Objective: Appearance: bedbound   Assessment and Plan: See problem oriented charting  Follow Up Instructions: referral to hospice given progression of her terminal cancer with known mets to liver and suspected to brain  I discussed the assessment and treatment plan with the patient. The patient was provided an opportunity to ask questions and all were answered. The patient agreed with the plan and demonstrated an understanding of the instructions.   The patient was advised to call back or seek an in-person evaluation if the symptoms worsen or if the condition fails to improve as anticipated.  Hoyt Koch, MD

## 2022-02-17 NOTE — Assessment & Plan Note (Signed)
Bedbound at this time. Has gotten some symptomatic transfusion in the past and they are unsure if she wants to continue. Refer to hospice as her urothelial cancer continues to bleed and is causing this anemia.

## 2022-02-17 NOTE — Assessment & Plan Note (Signed)
From urothelial cancer and having memory and concentration changes consistent with possible mets to brain. Referral to hospice appropriate and family supportive. Done today.

## 2022-02-17 NOTE — Assessment & Plan Note (Signed)
Referral to hospice. Prognosis is guarded given bedbound and with new mental status changes consistent with possible mets to the brain. Known mets to liver which had progressed on last imaging reviewed.

## 2022-02-19 ENCOUNTER — Telehealth: Payer: Self-pay

## 2022-02-19 DIAGNOSIS — C787 Secondary malignant neoplasm of liver and intrahepatic bile duct: Secondary | ICD-10-CM

## 2022-02-19 DIAGNOSIS — C689 Malignant neoplasm of urinary organ, unspecified: Secondary | ICD-10-CM

## 2022-02-19 DIAGNOSIS — D649 Anemia, unspecified: Secondary | ICD-10-CM
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
## 2022-02-20 NOTE — Telephone Encounter (Signed)
[facility] hospice referral has been placed.

## 2022-02-20 NOTE — Telephone Encounter (Signed)
Okay to send referral to another hospice place

## 2022-02-20 NOTE — Addendum Note (Signed)
Addended by: Thomes Cake on: 02/20/2022 04:02 PM   Modules accepted: Orders

## 2022-02-23 DIAGNOSIS — J441 Chronic obstructive pulmonary disease with (acute) exacerbation: Secondary | ICD-10-CM | POA: Diagnosis not present

## 2022-02-23 DIAGNOSIS — I5041 Acute combined systolic (congestive) and diastolic (congestive) heart failure: Secondary | ICD-10-CM | POA: Diagnosis not present

## 2022-03-01 ENCOUNTER — Emergency Department (HOSPITAL_COMMUNITY): Payer: Medicare Other

## 2022-03-01 ENCOUNTER — Emergency Department (HOSPITAL_COMMUNITY)
Admission: EM | Admit: 2022-03-01 | Discharge: 2022-03-01 | Disposition: A | Discharge status: Home / Self Care | Payer: Medicare Other | Attending: Emergency Medicine | Admitting: Emergency Medicine

## 2022-03-01 ENCOUNTER — Encounter (HOSPITAL_COMMUNITY): Payer: Self-pay

## 2022-03-01 ENCOUNTER — Other Ambulatory Visit: Payer: Self-pay

## 2022-03-01 DIAGNOSIS — R748 Abnormal levels of other serum enzymes: Secondary | ICD-10-CM | POA: Insufficient documentation

## 2022-03-01 DIAGNOSIS — R339 Retention of urine, unspecified: Secondary | ICD-10-CM | POA: Diagnosis present

## 2022-03-01 DIAGNOSIS — R6889 Other general symptoms and signs: Secondary | ICD-10-CM | POA: Diagnosis not present

## 2022-03-01 DIAGNOSIS — C787 Secondary malignant neoplasm of liver and intrahepatic bile duct: Secondary | ICD-10-CM | POA: Insufficient documentation

## 2022-03-01 DIAGNOSIS — C7919 Secondary malignant neoplasm of other urinary organs: Secondary | ICD-10-CM | POA: Diagnosis not present

## 2022-03-01 DIAGNOSIS — Z743 Need for continuous supervision: Secondary | ICD-10-CM | POA: Diagnosis not present

## 2022-03-01 DIAGNOSIS — N390 Urinary tract infection, site not specified: Secondary | ICD-10-CM | POA: Diagnosis not present

## 2022-03-01 DIAGNOSIS — C641 Malignant neoplasm of right kidney, except renal pelvis: Secondary | ICD-10-CM | POA: Diagnosis not present

## 2022-03-01 DIAGNOSIS — I2699 Other pulmonary embolism without acute cor pulmonale: Secondary | ICD-10-CM | POA: Insufficient documentation

## 2022-03-01 DIAGNOSIS — I824Y9 Acute embolism and thrombosis of unspecified deep veins of unspecified proximal lower extremity: Secondary | ICD-10-CM

## 2022-03-01 DIAGNOSIS — Z7401 Bed confinement status: Secondary | ICD-10-CM | POA: Diagnosis not present

## 2022-03-01 DIAGNOSIS — N281 Cyst of kidney, acquired: Secondary | ICD-10-CM | POA: Diagnosis not present

## 2022-03-01 DIAGNOSIS — N3000 Acute cystitis without hematuria: Secondary | ICD-10-CM

## 2022-03-01 DIAGNOSIS — I82411 Acute embolism and thrombosis of right femoral vein: Secondary | ICD-10-CM | POA: Diagnosis not present

## 2022-03-01 DIAGNOSIS — D649 Anemia, unspecified: Secondary | ICD-10-CM | POA: Insufficient documentation

## 2022-03-01 DIAGNOSIS — I499 Cardiac arrhythmia, unspecified: Secondary | ICD-10-CM | POA: Diagnosis not present

## 2022-03-01 DIAGNOSIS — J90 Pleural effusion, not elsewhere classified: Secondary | ICD-10-CM | POA: Diagnosis not present

## 2022-03-01 DIAGNOSIS — Z66 Do not resuscitate: Secondary | ICD-10-CM | POA: Insufficient documentation

## 2022-03-01 DIAGNOSIS — J9811 Atelectasis: Secondary | ICD-10-CM | POA: Diagnosis not present

## 2022-03-01 DIAGNOSIS — R319 Hematuria, unspecified: Secondary | ICD-10-CM | POA: Diagnosis not present

## 2022-03-01 DIAGNOSIS — I2693 Single subsegmental pulmonary embolism without acute cor pulmonale: Secondary | ICD-10-CM

## 2022-03-01 DIAGNOSIS — C7902 Secondary malignant neoplasm of left kidney and renal pelvis: Secondary | ICD-10-CM | POA: Insufficient documentation

## 2022-03-01 DIAGNOSIS — K7689 Other specified diseases of liver: Secondary | ICD-10-CM | POA: Diagnosis not present

## 2022-03-01 DIAGNOSIS — J329 Chronic sinusitis, unspecified: Secondary | ICD-10-CM | POA: Diagnosis not present

## 2022-03-01 DIAGNOSIS — C7951 Secondary malignant neoplasm of bone: Secondary | ICD-10-CM | POA: Diagnosis not present

## 2022-03-01 DIAGNOSIS — C679 Malignant neoplasm of bladder, unspecified: Secondary | ICD-10-CM | POA: Insufficient documentation

## 2022-03-01 DIAGNOSIS — R109 Unspecified abdominal pain: Secondary | ICD-10-CM | POA: Diagnosis not present

## 2022-03-01 DIAGNOSIS — C799 Secondary malignant neoplasm of unspecified site: Secondary | ICD-10-CM | POA: Insufficient documentation

## 2022-03-01 LAB — COMPREHENSIVE METABOLIC PANEL
ALT: 14 U/L (ref 0–44)
AST: 27 U/L (ref 15–41)
Albumin: 1.7 g/dL — ABNORMAL LOW (ref 3.5–5.0)
Alkaline Phosphatase: 1335 U/L — ABNORMAL HIGH (ref 38–126)
Anion gap: 7 (ref 5–15)
BUN: 23 mg/dL (ref 8–23)
CO2: 24 mmol/L (ref 22–32)
Calcium: 7.9 mg/dL — ABNORMAL LOW (ref 8.9–10.3)
Chloride: 105 mmol/L (ref 98–111)
Creatinine, Ser: 1.23 mg/dL — ABNORMAL HIGH (ref 0.44–1.00)
GFR, Estimated: 42 mL/min — ABNORMAL LOW (ref >60)
Glucose, Bld: 113 mg/dL — ABNORMAL HIGH (ref 70–99)
Potassium: 4.3 mmol/L (ref 3.5–5.1)
Sodium: 136 mmol/L (ref 135–145)
Total Bilirubin: 0.7 mg/dL (ref 0.3–1.2)
Total Protein: 5.4 g/dL — ABNORMAL LOW (ref 6.5–8.1)

## 2022-03-01 LAB — CBC WITH DIFFERENTIAL/PLATELET
Abs Immature Granulocytes: 0.03 10*3/uL (ref 0.00–0.07)
Basophils Absolute: 0.1 10*3/uL (ref 0.0–0.1)
Basophils Relative: 1 %
Eosinophils Absolute: 0.1 10*3/uL (ref 0.0–0.5)
Eosinophils Relative: 1 %
HCT: 30 % — ABNORMAL LOW (ref 36.0–46.0)
Hemoglobin: 9.3 g/dL — ABNORMAL LOW (ref 12.0–15.0)
Immature Granulocytes: 0 %
Lymphocytes Relative: 15 %
Lymphs Abs: 1.4 10*3/uL (ref 0.7–4.0)
MCH: 30.5 pg (ref 26.0–34.0)
MCHC: 31 g/dL (ref 30.0–36.0)
MCV: 98.4 fL (ref 80.0–100.0)
Monocytes Absolute: 0.5 10*3/uL (ref 0.1–1.0)
Monocytes Relative: 5 %
Neutro Abs: 7 10*3/uL (ref 1.7–7.7)
Neutrophils Relative %: 78 %
Platelets: 433 10*3/uL — ABNORMAL HIGH (ref 150–400)
RBC: 3.05 MIL/uL — ABNORMAL LOW (ref 3.87–5.11)
RDW: 16.4 % — ABNORMAL HIGH (ref 11.5–15.5)
WBC: 9 10*3/uL (ref 4.0–10.5)
nRBC: 0 % (ref 0.0–0.2)

## 2022-03-01 LAB — URINALYSIS, ROUTINE W REFLEX MICROSCOPIC
Bilirubin Urine: NEGATIVE
Glucose, UA: NEGATIVE mg/dL
Ketones, ur: NEGATIVE mg/dL
Nitrite: NEGATIVE
Protein, ur: 100 mg/dL — AB
RBC / HPF: >50 RBC/hpf — ABNORMAL HIGH (ref 0–5)
Specific Gravity, Urine: >1.046 — ABNORMAL HIGH (ref 1.005–1.030)
WBC, UA: >50 WBC/hpf — ABNORMAL HIGH (ref 0–5)
pH: 7 (ref 5.0–8.0)

## 2022-03-01 LAB — LACTIC ACID, PLASMA: Lactic Acid, Venous: 1.8 mmol/L (ref 0.5–1.9)

## 2022-03-01 MED ORDER — ONDANSETRON HCL 4 MG/2ML IJ SOLN
INTRAMUSCULAR | Status: AC
  Administered 2022-03-01: 4 mg via INTRAVENOUS
  Filled 2022-03-01: qty 2

## 2022-03-01 MED ORDER — IOHEXOL 300 MG/ML  SOLN
100.0000 mL | Freq: Once | INTRAMUSCULAR | Status: AC | PRN
  Administered 2022-03-01: 100 mL via INTRAVENOUS

## 2022-03-01 MED ORDER — CEPHALEXIN 500 MG PO CAPS: 500.0000 mg | ORAL_CAPSULE | Freq: Two times a day (BID) | ORAL | 0 refills | Status: AC

## 2022-03-01 MED ORDER — ONDANSETRON HCL 4 MG/2ML IJ SOLN: 4.0000 mg | Freq: Once | INTRAMUSCULAR | Status: AC

## 2022-03-01 MED ORDER — SODIUM CHLORIDE 0.9 % IV SOLN
1.0000 g | Freq: Once | INTRAVENOUS | Status: AC
  Administered 2022-03-01: 1 g via INTRAVENOUS
  Filled 2022-03-01: qty 10

## 2022-03-01 NOTE — ED Notes (Signed)
Patient was given a [place] sandwich and immediately began vomiting, MD Notified new orders placed.

## 2022-03-01 NOTE — ED Provider Notes (Signed)
88 yo female w/ metastatic cancer, on home hospice, presenting with complaint of urinary retention  Pending repeat CT scan  -   Patient reassessed.  Urinalysis with leukocytes and foul-smelling, suspect UTI.  Urine culture will be sent and she was given Rocephin, will discharge on 5 more days of Keflex at home.  CT scans personally reviewed and interpreted, agree with radiologist, showing progression of metastatic multifocal disease of the abdomen, no evident brain met.  She also was noted to have a pulmonary embolism and a DVT.  I confirmed with the patient and her granddaughter at bedside her goals of care and her wishes to remain on hospice and to minimize hospitalization and not to pursue aggressive medical intervention.  We will not start her on anticoagulation at this time as this does carry several risks of bleeding, she understands that blood clots can be potentially fatal.  I provided him paperwork to take back to the hospice agency to demonstrate progression of her metastatic disease.  I answered all of their questions at bedside.  Patient will need ambulance transport back to her home as she is not mobile.   Wyvonnia Dusky, MD 03/01/22 [phone]

## 2022-03-01 NOTE — ED Provider Notes (Signed)
[facility] EMERGENCY DEPARTMENT Provider Note   CSN: [id number] Arrival date & time: 03/01/22  1305     History  Chief Complaint  Patient presents with   Urinary Retention    [name] Case is a 88 y.o. female.  Patient with reported history of metastatic cancer, brought in by family members concern for decreased urine output today.  Family states she has not urinated for at least a day unsure exactly how long.  Patient otherwise denies any abdominal pain denies any pain at this time.  She used to be on hospice.  Family states that the hospice states that after a year they took her off and stated that they would need repeat body scans to put her back on hospice.  Otherwise the patient at this time has no reports of any fevers or cough or vomiting or diarrhea or any pain.  She states that she would like to go back on hospice but they are requesting a scan of her body.       Home Medications Prior to Admission medications   Medication Sig Start Date End Date Taking? Authorizing Provider  acetaminophen (TYLENOL) 500 MG tablet Take 1,000 mg by mouth every 4 (four) hours as needed for moderate pain (in conjunection with Oxycodone IR 5 mg tablets).    [provider]  albuterol (PROVENTIL) (2.5 MG/3ML) 0.083% nebulizer solution Take 3 mLs (2.5 mg total) by nebulization every 6 (six) hours as needed for wheezing or shortness of breath. 01/22/21   Blanchie Dessert, MD  AMBULATORY NON FORMULARY MEDICATION Rolator 11/09/19   Lyndal Pulley, DO  atorvastatin (LIPITOR) 20 MG tablet Take 1 tablet (20 mg total) by mouth daily. 02/11/21   Hoyt Koch, MD  benzonatate (TESSALON) 200 MG capsule Take 1 capsule (200 mg total) by mouth 2 (two) times daily as needed for cough. 01/22/21   Blanchie Dessert, MD  calcium carbonate (TUMS EX) 750 MG chewable tablet Chew 1 tablet by mouth as needed for heartburn.    [provider]  dextromethorphan-guaiFENesin  (MUCINEX DM) 30-600 MG 12hr tablet Take 1 tablet by mouth 2 (two) times daily. 09/03/20   Regalado, Belkys A, MD  diltiazem (CARDIZEM CD) 180 MG 24 hr capsule TAKE 1 CAPSULE BY MOUTH EVERY DAY Patient taking differently: Take 180 mg by mouth daily. 10/15/20   Hoyt Koch, MD  furosemide (LASIX) 40 MG tablet TAKE 2 TABLETS BY MOUTH EVERY DAY Patient taking differently: Take 80 mg by mouth daily. 01/09/21   Hoyt Koch, MD  ipratropium (ATROVENT HFA) 17 MCG/ACT inhaler Inhale 1 puff into the lungs 3 (three) times daily. 09/03/20 09/03/21  Regalado, Jerald Kief A, MD  levothyroxine (SYNTHROID) 100 MCG tablet TAKE 1 TABLET BY MOUTH EVERY DAY Patient taking differently: Take 100 mcg by mouth daily before breakfast. 05/28/20   Hoyt Koch, MD  LORazepam (ATIVAN) 0.5 MG tablet Take 0.5 mg by mouth every 4 (four) hours as needed for anxiety.    [provider]  metoprolol succinate (TOPROL-XL) 50 MG 24 hr tablet TAKE 1 TABLET BY MOUTH DAILY WITH OR IMMEDIATELY FOLLOWING A MEAL. Patient taking differently: Take 50 mg by mouth See admin instructions. TAKE 1 TABLET BY MOUTH DAILY WITH OR IMMEDIATELY FOLLOWING A MEAL. 10/15/20   Hoyt Koch, MD  nystatin ointment (MYCOSTATIN) Apply 1 application topically 2 (two) times daily. Patient taking differently: Apply 1 application topically See admin instructions. Apply to affected areas 2 times a day 03/02/20  Hoyt Koch, MD  oxyCODONE (OXY IR/ROXICODONE) 5 MG immediate release tablet Take 5-10 mg by mouth every 4 (four) hours as needed (for pain).    [provider]  OXYGEN Inhale 2 L/min into the lungs continuous.    [provider]  phenazopyridine (PYRIDIUM) 200 MG tablet Take 1 tablet (200 mg total) by mouth 3 (three) times daily. 02/04/21   Kinnie Feil, PA-C  potassium chloride SA (KLOR-CON M20) 20 MEQ tablet Take 1 tablet (20 mEq total) by mouth daily. Please call our office to schedule a follow  up to receive additional refills. 12/21/20   Hoyt Koch, MD  promethazine (PHENERGAN) 25 MG tablet Take 25 mg by mouth every 8 (eight) hours as needed for nausea or vomiting.    [provider]  ipratropium (ATROVENT) 0.02 % nebulizer solution Take 5 mLs (1 mg total) by nebulization every 6 (six) hours as needed for wheezing or shortness of breath. Patient not taking: Reported on 09/02/2020 08/04/18 09/02/20  Georgette Shell, MD  irbesartan (AVAPRO) 150 MG tablet TAKE 1/2 TABLET BY MOUTH EVERY DAY Patient not taking: Reported on 09/02/2020 03/13/20 09/02/20  Hoyt Koch, MD      Allergies    Eliquis [apixaban], Oxycodone, and Codeine    Review of Systems   Review of Systems  Constitutional:  Negative for fever.  HENT:  Negative for ear pain.   Eyes:  Negative for pain.  Respiratory:  Negative for cough.   Cardiovascular:  Negative for chest pain.  Gastrointestinal:  Negative for abdominal pain.  Genitourinary:  Negative for flank pain.  Musculoskeletal:  Negative for back pain.  Skin:  Negative for rash.  Neurological:  Negative for headaches.    Physical Exam Updated Vital Signs BP (!) 102/51   Pulse 72   Temp 98 F (36.7 C) (Oral)   Resp 18   LMP  (LMP Unknown)   SpO2 100%  Physical Exam Constitutional:      General: She is not in acute distress.    Appearance: Normal appearance.  HENT:     Head: Normocephalic.     Nose: Nose normal.  Eyes:     Extraocular Movements: Extraocular movements intact.  Cardiovascular:     Rate and Rhythm: Normal rate.  Pulmonary:     Effort: Pulmonary effort is normal.  Abdominal:     Tenderness: There is no abdominal tenderness. There is no guarding or rebound.  Musculoskeletal:        General: Normal range of motion.     Cervical back: Normal range of motion.  Neurological:     General: No focal deficit present.     Mental Status: She is alert. Mental status is at baseline.     ED Results / Procedures /  Treatments   Labs (all labs ordered are listed, but only abnormal results are displayed) Labs Reviewed  COMPREHENSIVE METABOLIC PANEL - Abnormal; Notable for the following components:      Result Value   Glucose, Bld 113 (*)    Creatinine, Ser 1.23 (*)    Calcium 7.9 (*)    Total Protein 5.4 (*)    Albumin 1.7 (*)    Alkaline Phosphatase 1,335 (*)    GFR, Estimated 42 (*)    All other components within normal limits  CBC WITH DIFFERENTIAL/PLATELET - Abnormal; Notable for the following components:   RBC 3.05 (*)    Hemoglobin 9.3 (*)    HCT 30.0 (*)  RDW 16.4 (*)    Platelets 433 (*)    All other components within normal limits  LACTIC ACID, PLASMA  LACTIC ACID, PLASMA  URINALYSIS, ROUTINE W REFLEX MICROSCOPIC    EKG None  Radiology No results found.  Procedures Procedures    Medications Ordered in ED Medications - No data to display  ED Course/ Medical Decision Making/ A&P                           Medical Decision Making Amount and/or Complexity of Data Reviewed Labs: ordered. Radiology: ordered.   Review of records shows visit to visit February 19, 2022 with cancer specialist.  History from family at bedside.  Diagnostic studies sent white count normal hemoglobin anemic at 9 chemistry shows elevated alk phos isolated.  AST ALT T. bili normal.  Straight catheterization attempted by nursing staff.  CT abdomen pelvis pursued, consistent with metastatic disease.  Patient reiterates that she does not want any interventions that she wants to be comfort measures.  She would like to pursue the CT scan to help her get back on hospice care.  Patient to be signed out oncoming provider.        Final Clinical Impression(s) / ED Diagnoses Final diagnoses:  Metastatic malignant neoplasm, unspecified site [facility])    Rx / [place] Orders ED Discharge Orders     None         Luna Fuse, MD 03/01/22 [phone]

## 2022-03-01 NOTE — Discharge Instructions (Signed)
[name] Case was diagnosed with a urinary tract infection today and given antibiotics in the ER.  She will need to take oral antibiotics for the next 5 days at home, beginning tomorrow.  These were prescribed to her pharmacy.  We did repeat CT scans of the body including the brain.  The scans show progression of the metastatic cancer in the abdomen, including the liver, kidneys, and urethra.  There is no visible cancer in the brain at this time.  We did also find that there were signs of blood clots in the leg as well as in her lung.  These are likely caused by the cancer.  I do think this work-up shows progression of your metastatic disease.  I printed a copy of your report to discuss with the hospice services.

## 2022-03-01 NOTE — ED Triage Notes (Signed)
Patient arrived from home by [facility]-patient is a DNR and family concerned of constipation and urinary pressure and urgency. Patient has bladder and kidney cancer and has elected no treatment. Patient alert to baseline and appears in no distress

## 2022-03-04 LAB — URINE CULTURE: Culture: >=100000 — AB

## 2022-03-05 NOTE — Progress Notes (Signed)
ED Antimicrobial Stewardship Positive Culture Follow Up   [name] Case is an 88 y.o. female who presented to [facility] on 03/01/2022 with a chief complaint of  Chief Complaint  Patient presents with   Urinary Retention    Recent Results (from the past 720 hour(s))  Urine Culture     Status: Abnormal   Collection Time: 03/01/22  1:21 PM   Specimen: In/Out Cath Urine  Result Value Ref Range Status   Specimen Description IN/OUT CATH URINE  Final   Special Requests   Final    NONE Performed at [facility], [address] [place], [place] [address]    Culture >=100,000 COLONIES/mL [facility] MORGANII (A)  Final   Report Status 03/04/2022 FINAL  Final   Organism ID, Bacteria MORGANELLA MORGANII (A)  Final      Susceptibility   Morganella morganii - MIC*    AMPICILLIN >=32 RESISTANT Resistant     CEFAZOLIN >=64 RESISTANT Resistant     CIPROFLOXACIN <=0.25 SENSITIVE Sensitive     GENTAMICIN <=1 SENSITIVE Sensitive     IMIPENEM 2 SENSITIVE Sensitive     NITROFURANTOIN 128 RESISTANT Resistant     TRIMETH/SULFA <=20 SENSITIVE Sensitive     AMPICILLIN/SULBACTAM 16 INTERMEDIATE Intermediate     PIP/TAZO <=4 SENSITIVE Sensitive     * >=100,000 COLONIES/mL MORGANELLA MORGANII    '[x]'$  Treated with cephalexin, organism resistant to prescribed antimicrobial '[]'$  Patient discharged originally without antimicrobial agent and treatment is now indicated  New antibiotic prescription: ciprofloxacin '500mg'$  BID x 3 days  ED Provider: Anselmo Pickler, PA-c   [name] Case 03/05/2022, 12:54 PM Clinical Pharmacist Monday - Friday phone -  [phone] Saturday - Sunday phone - [phone]

## 2022-03-10 DIAGNOSIS — J449 Chronic obstructive pulmonary disease, unspecified: Secondary | ICD-10-CM | POA: Diagnosis not present

## 2022-04-10 DIAGNOSIS — J449 Chronic obstructive pulmonary disease, unspecified: Secondary | ICD-10-CM | POA: Diagnosis not present

## 2022-05-02 DEATH — deceased
